# Patient Record
Sex: Female | Born: 1940 | Race: Black or African American | Hispanic: No | Marital: Single | Smoking: Former smoker
Health system: Southern US, Community
[De-identification: ages and names within clinical notes are randomized; demographics above are authoritative.]

## PROBLEM LIST (undated history)

## (undated) DIAGNOSIS — C50919 Malignant neoplasm of unspecified site of unspecified female breast: Secondary | ICD-10-CM

## (undated) DIAGNOSIS — G709 Myoneural disorder, unspecified: Secondary | ICD-10-CM

## (undated) DIAGNOSIS — C801 Malignant (primary) neoplasm, unspecified: Secondary | ICD-10-CM

## (undated) DIAGNOSIS — M1990 Unspecified osteoarthritis, unspecified site: Secondary | ICD-10-CM

## (undated) DIAGNOSIS — Q6689 Other  specified congenital deformities of feet: Secondary | ICD-10-CM

## (undated) DIAGNOSIS — Z973 Presence of spectacles and contact lenses: Secondary | ICD-10-CM

## (undated) DIAGNOSIS — K219 Gastro-esophageal reflux disease without esophagitis: Secondary | ICD-10-CM

## (undated) DIAGNOSIS — I10 Essential (primary) hypertension: Secondary | ICD-10-CM

## (undated) DIAGNOSIS — E079 Disorder of thyroid, unspecified: Secondary | ICD-10-CM

## (undated) DIAGNOSIS — Z923 Personal history of irradiation: Secondary | ICD-10-CM

## (undated) DIAGNOSIS — E785 Hyperlipidemia, unspecified: Secondary | ICD-10-CM

## (undated) DIAGNOSIS — C799 Secondary malignant neoplasm of unspecified site: Secondary | ICD-10-CM

## (undated) DIAGNOSIS — I890 Lymphedema, not elsewhere classified: Secondary | ICD-10-CM

## (undated) HISTORY — PX: COLONOSCOPY: SHX174

## (undated) HISTORY — DX: Malignant (primary) neoplasm, unspecified: C80.1

## (undated) HISTORY — DX: Disorder of thyroid, unspecified: E07.9

## (undated) HISTORY — PX: DILATION AND CURETTAGE OF UTERUS: SHX78

## (undated) HISTORY — DX: Hyperlipidemia, unspecified: E78.5

## (undated) HISTORY — DX: Other specified congenital deformities of feet: Q66.89

## (undated) HISTORY — PX: FOOT SURGERY: SHX648

## (undated) HISTORY — DX: Unspecified osteoarthritis, unspecified site: M19.90

---

## 2000-08-10 HISTORY — PX: THYROIDECTOMY: SHX17

## 2008-08-29 LAB — HM PAP SMEAR: HM Pap smear: NORMAL

## 2010-08-10 HISTORY — PX: BREAST SURGERY: SHX581

## 2011-07-22 ENCOUNTER — Telehealth: Payer: Self-pay | Admitting: Oncology

## 2011-07-22 ENCOUNTER — Telehealth: Payer: Self-pay | Admitting: *Deleted

## 2011-07-22 ENCOUNTER — Other Ambulatory Visit: Payer: Self-pay | Admitting: Oncology

## 2011-07-22 DIAGNOSIS — N63 Unspecified lump in unspecified breast: Secondary | ICD-10-CM

## 2011-07-22 DIAGNOSIS — Z9889 Other specified postprocedural states: Secondary | ICD-10-CM

## 2011-07-22 NOTE — Telephone Encounter (Signed)
patient confirmed over the phone the new date and time of the new patient appointment  for 08-26-2011 starting at 2:00pm made patient mammogram appointment for 08-18-2011 at 2:00pm at the breast center

## 2011-07-22 NOTE — Telephone Encounter (Signed)
New referral Dx Breast Cancer.

## 2011-08-18 ENCOUNTER — Ambulatory Visit
Admission: RE | Admit: 2011-08-18 | Discharge: 2011-08-18 | Disposition: A | Discharge status: Home / Self Care | Payer: Medicare Other | Source: Ambulatory Visit | Attending: Oncology | Admitting: Oncology

## 2011-08-18 ENCOUNTER — Other Ambulatory Visit: Payer: Self-pay | Admitting: Oncology

## 2011-08-18 DIAGNOSIS — N63 Unspecified lump in unspecified breast: Secondary | ICD-10-CM

## 2011-08-26 ENCOUNTER — Other Ambulatory Visit (HOSPITAL_BASED_OUTPATIENT_CLINIC_OR_DEPARTMENT_OTHER): Payer: Medicare Other

## 2011-08-26 ENCOUNTER — Ambulatory Visit (HOSPITAL_BASED_OUTPATIENT_CLINIC_OR_DEPARTMENT_OTHER): Payer: Medicare Other

## 2011-08-26 ENCOUNTER — Telehealth: Payer: Self-pay | Admitting: Oncology

## 2011-08-26 ENCOUNTER — Ambulatory Visit: Payer: Medicare Other | Admitting: Oncology

## 2011-08-26 VITALS — BP 132/80 | HR 80 | Temp 98.2°F | Ht 60.0 in | Wt 227.5 lb

## 2011-08-26 DIAGNOSIS — C50919 Malignant neoplasm of unspecified site of unspecified female breast: Secondary | ICD-10-CM

## 2011-08-26 LAB — CBC WITH DIFFERENTIAL/PLATELET
BASO%: 0.4 % (ref 0.0–2.0)
Basophils Absolute: 0 10*3/uL (ref 0.0–0.1)
Eosinophils Absolute: 0.2 10*3/uL (ref 0.0–0.5)
HCT: 37.2 % (ref 34.8–46.6)
HGB: 12.7 g/dL (ref 11.6–15.9)
MONO#: 0.5 10*3/uL (ref 0.1–0.9)
NEUT#: 3.8 10*3/uL (ref 1.5–6.5)
NEUT%: 62.8 % (ref 38.4–76.8)
Platelets: 279 10*3/uL (ref 145–400)
WBC: 6.1 10*3/uL (ref 3.9–10.3)
lymph#: 1.6 10*3/uL (ref 0.9–3.3)

## 2011-08-26 NOTE — Telephone Encounter (Signed)
gve the pt her April 2013 appt calendar 

## 2011-08-27 LAB — COMPREHENSIVE METABOLIC PANEL
ALT: 10 U/L (ref 0–35)
BUN: 13 mg/dL (ref 6–23)
CO2: 26 mEq/L (ref 19–32)
Calcium: 9.5 mg/dL (ref 8.4–10.5)
Chloride: 105 mEq/L (ref 96–112)
Creatinine, Ser: 0.75 mg/dL (ref 0.50–1.10)
Glucose, Bld: 77 mg/dL (ref 70–99)

## 2011-08-27 LAB — CANCER ANTIGEN 27.29: CA 27.29: 24 U/mL (ref 0–39)

## 2011-08-27 LAB — VITAMIN D 25 HYDROXY (VIT D DEFICIENCY, FRACTURES): Vit D, 25-Hydroxy: 22 ng/mL — ABNORMAL LOW (ref 30–89)

## 2011-08-30 DIAGNOSIS — C50919 Malignant neoplasm of unspecified site of unspecified female breast: Secondary | ICD-10-CM | POA: Insufficient documentation

## 2011-08-30 NOTE — Progress Notes (Signed)
ID: Christy Harrell  DOB: 08/14/1940  MR#: [id number]  CSN#: [id number]  HPI: The patient is a 71 year old woman recently returned to this area from [place], with a history of breast cancer, establishing herself in my practice.  The patient had routine screening mammography 10/16/2010 showing a lobulated mass in the right subareolar region measuring up to 5.5 cm. The Left breast showed some central microcalcifications. Biopsy of the Right breast mass on 10/28/2010 showed an invasive ductal carcinoma, grade 2, estrogen receptor positive (Allred score 8), progesterone receptor positive (Allred score 5), with an equivocal HER-2. The Left breast area of microcalcifications was biopsied at the same time, and was read as suspicious for DCIS.  On 11/17/2010 the patient underwent Right lumpectomy and axillary lymph node dissection for what proved to be a 3 cm invasive ductal carcinoma, grade 2, with some papillary and mucinous features. One of 16 lymph nodes was involved. FISH showed no HER-2 amplification. Left breast biopsy was benign.  The patient had an Oncotype sent, with a score of 27, predicting a risk of distant recurrence after 5 years of tamoxifen in the 18% range. With this intermediate result, the decision was made not to proceed with chemotherapy, since the patient is the sole caregiver to her very elderly mother. Instead the patient proceeded to radiation treatment which was completed 03/06/2011. She started anastrozole at that point.  Interval History:   She is settling back in [place] well, with no unusual complaints. She has not yet established herself with her primary care physician. Her only relative nearby aside from her mother, is her uncle Burman Freestone, who lives in [place]  ROS:  She has some hot flashes and night sweats, which she attributes probably correctly to anastrozole. She is not having any of the diffuse arthralgias/myalgias that some patients can experience  from that medication, although she has chronic pain in her right shoulder and lower back. She sleeps on 2 pillows, but is not otherwise short of breath with her activities of daily living. A detailed review of systems was otherwise noncontributory  Allergies not on file  Current Outpatient Prescriptions  Medication Sig Dispense Refill  . anastrozole (ARIMIDEX) 1 MG tablet Take 1 mg by mouth daily.      Marland Kitchen atorvastatin (LIPITOR) 10 MG tablet Take 10 mg by mouth daily.      . Calcium Carbonate-Vitamin D (CALCIUM 600 + D PO) Take 1 tablet by mouth.      . furosemide (LASIX) 40 MG tablet Take 40 mg by mouth daily.      Marland Kitchen HYDROcodone-acetaminophen (NORCO) 5-325 MG per tablet Take 1 tablet by mouth every 6 (six) hours as needed.      . ibandronate (BONIVA) 150 MG tablet Take 150 mg by mouth every 30 (thirty) days. Take in the morning with a full glass of water, on an empty stomach, and do not take anything else by mouth or lie down for the next 30 min.      . metFORMIN (GLUCOPHAGE) 500 MG tablet Take 500 mg by mouth daily.       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 Status post C-section. Obesity. Osteopenia. Hypertension. History of depression. History of minimal tobacco abuse   Family history: The patient's father died from unknown causes. The patient's mother is alive at age 90. The patient was a single child. There is no history of breast or ovarian cancer  in the family to her knowledge.  GYN Hx: Menarche age 66, menopause in her early 76s. The patient is GX P1, first pregnancy to term age 89. She did not take hormone replacement  Social history: The patient grew up in [place], [employer], then moved to [place] where she lived about 40 years. 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  Her mother, [name], 58, lives with the patient. The patient's daughter [name], 41, is disabled secondary to pulmonary hypertension. She can be reached at [phone]. The patient has 2 grandchildren, [name] who works at a Human resources officer business and is 71 years old, in general, 13, completing high school.  Health maintenance: She smoked less than a half a pack per day for less than 15 years so she has a less than 10-pack-year tobacco abuse history. She uses alcohol rarely. Her cholesterol is under treatment. She had a bone density in 2012 and is on ibandronate chronically. She has not had a Pap smear in many years.  Objective:  Filed Vitals:   08/26/11 1438  BP: 132/80  Pulse: 80  Temp: 98.2 F (36.8 C)    BMI: Body mass index is 44.43 kg/(m^2).   ECOG FS: 0  Physical Exam:   Sclerae unicteric  Oropharynx clear  No peripheral adenopathy  Lungs clear -- no rales or rhonchi  Heart regular rate and rhythm  Abdomen obese, benign  MSK scoliosis but no focal spinal tenderness  Neuro nonfocal  Breast exam: Right breast status post lumpectomy; no evidence of local recurrence. Left breast no suspicious masses.  Lab Results:      Chemistry      Component Value Date/Time   NA 140 08/26/2011 1355   K 4.2 08/26/2011 1355   CL 105 08/26/2011 1355   CO2 26 08/26/2011 1355   BUN 13 08/26/2011 1355   CREATININE 0.75 08/26/2011 1355      Component Value Date/Time   CALCIUM 9.5 08/26/2011 1355   ALKPHOS 63 08/26/2011 1355   AST 14 08/26/2011 1355   ALT 10 08/26/2011 1355   BILITOT 0.3 08/26/2011 1355       Lab Results  Component Value Date   WBC 6.1 08/26/2011   HGB 12.7 08/26/2011   HCT 37.2 08/26/2011   MCV 89.9 08/26/2011   PLT 279 08/26/2011   NEUTROABS 3.8 08/26/2011    Studies/Results:  Mm Digital Diagnostic Bilat  08/18/2011  *RADIOLOGY REPORT*  Clinical Data:  The patient underwent right lumpectomy and radiation therapy for breast cancer in 2012.  She underwent left  surgical excisional biopsy with benign results in 2012.  DIGITAL DIAGNOSTIC BILATERAL MAMMOGRAM WITH CAD  Comparison:  10/16/2010 from [facility] in [place], [place]  Findings:  The breast tissue is almost entirely fatty. Right lumpectomy and radiation therapy changes are noted. Left excisional biopsy changes are noted.  There is no dominant mass, nonsurgical architectural distortion or calcification to suggest malignancy. Mammographic images were processed with CAD.  IMPRESSION: No mammographic evidence of malignancy.  Yearly diagnostic mammography is suggested.  BI-RADS CATEGORY 2:  Benign finding(s).  Original Report Authenticated By: Daryl Eastern, M.D.    Assessment: 71 year old [place] woman status post right lumpectomy and axillary lymph node dissection 11/17/2010 for a pT2 pN1, stage IIb invasive ductal carcinoma, grade 2, estrogen and progesterone receptor positive, HER-2 negative, with an Oncotype DX recurrence score of 27 predicting a risk of distant recurrence  of 18% with 5 years of tamoxifen (intermediate score); status post radiation completed July of 2012, at which time the patient started anastrozole.   Plan: She is doing very well from a breast cancer point of view, and the plan is to continue anastrozole for 5 years and then decide whether or not to go on to 10 years. We frequently see patients like her every 3 months for the first 2 years, then every 6 months. She is going to establish yourself with Dr. Sanda Linger later this month. If he is going to be seeing her intensely because of her chronic medical problems, then visits here would not need to be so frequent. MAGRINAT,GUSTAV C 08/30/2011

## 2011-09-03 ENCOUNTER — Other Ambulatory Visit (INDEPENDENT_AMBULATORY_CARE_PROVIDER_SITE_OTHER): Payer: Medicare Other

## 2011-09-03 ENCOUNTER — Encounter: Payer: Self-pay | Admitting: Internal Medicine

## 2011-09-03 ENCOUNTER — Ambulatory Visit (INDEPENDENT_AMBULATORY_CARE_PROVIDER_SITE_OTHER): Payer: Medicare Other | Admitting: Internal Medicine

## 2011-09-03 DIAGNOSIS — E1140 Type 2 diabetes mellitus with diabetic neuropathy, unspecified: Secondary | ICD-10-CM | POA: Insufficient documentation

## 2011-09-03 DIAGNOSIS — M810 Age-related osteoporosis without current pathological fracture: Secondary | ICD-10-CM

## 2011-09-03 DIAGNOSIS — IMO0001 Reserved for inherently not codable concepts without codable children: Secondary | ICD-10-CM

## 2011-09-03 DIAGNOSIS — E7800 Pure hypercholesterolemia, unspecified: Secondary | ICD-10-CM

## 2011-09-03 DIAGNOSIS — E785 Hyperlipidemia, unspecified: Secondary | ICD-10-CM | POA: Insufficient documentation

## 2011-09-03 DIAGNOSIS — IMO0002 Reserved for concepts with insufficient information to code with codable children: Secondary | ICD-10-CM | POA: Insufficient documentation

## 2011-09-03 DIAGNOSIS — M159 Polyosteoarthritis, unspecified: Secondary | ICD-10-CM

## 2011-09-03 DIAGNOSIS — Z23 Encounter for immunization: Secondary | ICD-10-CM

## 2011-09-03 DIAGNOSIS — E039 Hypothyroidism, unspecified: Secondary | ICD-10-CM

## 2011-09-03 LAB — URINALYSIS, ROUTINE W REFLEX MICROSCOPIC
Bilirubin Urine: NEGATIVE
Ketones, ur: NEGATIVE
Nitrite: NEGATIVE
Total Protein, Urine: NEGATIVE

## 2011-09-03 LAB — CK: Total CK: 155 U/L (ref 7–177)

## 2011-09-03 LAB — LIPID PANEL
Cholesterol: 255 mg/dL — ABNORMAL HIGH (ref 0–200)
Total CHOL/HDL Ratio: 4
Triglycerides: 121 mg/dL (ref 0.0–149.0)

## 2011-09-03 LAB — LDL CHOLESTEROL, DIRECT: Direct LDL: 174.5 mg/dL

## 2011-09-03 MED ORDER — CELECOXIB 200 MG PO CAPS: 200.0000 mg | ORAL_CAPSULE | Freq: Two times a day (BID) | ORAL | Status: AC

## 2011-09-03 NOTE — Progress Notes (Signed)
Subjective:    Patient ID: Christy Harrell, female    DOB: 01-23-1941, 71 y.o.   MRN: [id number]  Diabetes She presents for her follow-up diabetic visit. She has type 2 diabetes mellitus. Her disease course has been stable. There are no hypoglycemic associated symptoms. Pertinent negatives for hypoglycemia include no dizziness, headaches, pallor, seizures, speech difficulty or tremors. Pertinent negatives for diabetes include no blurred vision, no chest pain, no fatigue, no foot paresthesias, no foot ulcerations, no polydipsia, no polyphagia, no polyuria, no visual change, no weakness and no weight loss. There are no hypoglycemic complications. Symptoms are stable. There are no diabetic complications. Current diabetic treatment includes oral agent (monotherapy). She is compliant with treatment all of the time. Her weight is stable. She is following a generally healthy diet. Meal planning includes avoidance of concentrated sweets. She has not had a previous visit with a dietician. She participates in exercise intermittently. There is no change in her home blood glucose trend. An ACE inhibitor/angiotensin II receptor blocker is not being taken. She does not see a podiatrist.Eye exam is current.      Review of Systems  Constitutional: Negative for fever, chills, weight loss, diaphoresis, activity change, appetite change, fatigue and unexpected weight change.  HENT: Negative.   Eyes: Negative.  Negative for blurred vision.  Respiratory: Negative for cough, chest tightness, shortness of breath, wheezing and stridor.   Cardiovascular: Negative for chest pain, palpitations and leg swelling.  Gastrointestinal: Negative for nausea, vomiting, abdominal pain, diarrhea, constipation, blood in stool, abdominal distention, anal bleeding and rectal pain.  Genitourinary: Negative for dysuria, urgency, polyuria, frequency, hematuria, flank pain, decreased urine volume, enuresis, difficulty urinating and  dyspareunia.  Musculoskeletal: Positive for myalgias and arthralgias (shoulders, hips, and knees). Negative for back pain, joint swelling and gait problem.  Skin: Negative for color change, pallor, rash and wound.  Neurological: Negative for dizziness, tremors, seizures, syncope, facial asymmetry, speech difficulty, weakness, light-headedness, numbness and headaches.  Hematological: Negative for polydipsia, polyphagia and adenopathy. Does not bruise/bleed easily.  Psychiatric/Behavioral: Negative.        Objective:   Physical Exam  Vitals reviewed. Constitutional: She is oriented to person, place, and time. She appears well-developed and well-nourished. No distress.  HENT:  Head: Normocephalic and atraumatic.  Mouth/Throat: Oropharynx is clear and moist. No oropharyngeal exudate.  Eyes: Conjunctivae are normal. Right eye exhibits no discharge. Left eye exhibits no discharge. No scleral icterus.  Neck: Normal range of motion. Neck supple. No JVD present. No tracheal deviation present. No thyromegaly present.  Cardiovascular: Normal rate, regular rhythm, normal heart sounds and intact distal pulses.  Exam reveals no gallop and no friction rub.   No murmur heard. Pulmonary/Chest: Effort normal and breath sounds normal. No stridor. No respiratory distress. She has no wheezes. She has no rales. She exhibits no tenderness.  Abdominal: Soft. Bowel sounds are normal. She exhibits no distension and no mass. There is no tenderness. There is no rebound and no guarding.  Musculoskeletal: Normal range of motion. She exhibits no edema and no tenderness.  Lymphadenopathy:    She has no cervical adenopathy.  Neurological: She is oriented to person, place, and time.  Skin: Skin is warm and dry. No rash noted. She is not diaphoretic. No erythema. No pallor.  Psychiatric: She has a normal mood and affect. Her behavior is normal. Judgment and thought content normal.      Lab Results  Component Value  Date   WBC 6.1 08/26/2011   HGB 12.7  08/26/2011   HCT 37.2 08/26/2011   PLT 279 08/26/2011   GLUCOSE 77 08/26/2011   ALT 10 08/26/2011   AST 14 08/26/2011   NA 140 08/26/2011   K 4.2 08/26/2011   CL 105 08/26/2011   CREATININE 0.75 08/26/2011   BUN 13 08/26/2011   CO2 26 08/26/2011      Assessment & Plan:

## 2011-09-03 NOTE — Assessment & Plan Note (Signed)
Check her TSH level today 

## 2011-09-03 NOTE — Assessment & Plan Note (Signed)
She is due for an a1c and a UA, for now she will continue with metformin

## 2011-09-03 NOTE — Patient Instructions (Signed)

## 2011-09-03 NOTE — Assessment & Plan Note (Signed)
She has had a good response to celebrex in that past so I have asked her to restart it

## 2011-09-03 NOTE — Assessment & Plan Note (Signed)
In light her myalgias and arthralgias I will check a CK level today and will also look at FLP

## 2011-09-03 NOTE — Assessment & Plan Note (Signed)
I will check her Vit D level today 

## 2011-09-05 LAB — VITAMIN D 1,25 DIHYDROXY
Vitamin D 1, 25 (OH)2 Total: 62 pg/mL (ref 18–72)
Vitamin D2 1, 25 (OH)2: <8 pg/mL
Vitamin D3 1, 25 (OH)2: 62 pg/mL

## 2011-09-07 ENCOUNTER — Encounter: Payer: Self-pay | Admitting: Internal Medicine

## 2011-09-30 ENCOUNTER — Telehealth: Payer: Self-pay | Admitting: Oncology

## 2011-09-30 NOTE — Telephone Encounter (Signed)
S/w the pt and she is aware of her April 2013 appt that has been r/s from 11:00am to 11:45 am on the same day

## 2011-10-09 ENCOUNTER — Other Ambulatory Visit: Payer: Self-pay | Admitting: Internal Medicine

## 2011-10-09 ENCOUNTER — Ambulatory Visit (INDEPENDENT_AMBULATORY_CARE_PROVIDER_SITE_OTHER): Payer: Medicare Other | Admitting: Internal Medicine

## 2011-10-09 ENCOUNTER — Encounter: Payer: Self-pay | Admitting: Internal Medicine

## 2011-10-09 ENCOUNTER — Ambulatory Visit (INDEPENDENT_AMBULATORY_CARE_PROVIDER_SITE_OTHER)
Admission: RE | Admit: 2011-10-09 | Discharge: 2011-10-09 | Disposition: A | Discharge status: Home / Self Care | Payer: Medicare Other | Source: Ambulatory Visit | Attending: Internal Medicine | Admitting: Internal Medicine

## 2011-10-09 DIAGNOSIS — E039 Hypothyroidism, unspecified: Secondary | ICD-10-CM

## 2011-10-09 DIAGNOSIS — M25511 Pain in right shoulder: Secondary | ICD-10-CM | POA: Insufficient documentation

## 2011-10-09 DIAGNOSIS — M159 Polyosteoarthritis, unspecified: Secondary | ICD-10-CM

## 2011-10-09 DIAGNOSIS — M19019 Primary osteoarthritis, unspecified shoulder: Secondary | ICD-10-CM

## 2011-10-09 DIAGNOSIS — M25519 Pain in unspecified shoulder: Secondary | ICD-10-CM

## 2011-10-09 DIAGNOSIS — M25512 Pain in left shoulder: Secondary | ICD-10-CM

## 2011-10-09 DIAGNOSIS — E7800 Pure hypercholesterolemia, unspecified: Secondary | ICD-10-CM

## 2011-10-09 MED ORDER — HYDROCODONE-ACETAMINOPHEN 5-325 MG PO TABS: 1.0000 | ORAL_TABLET | Freq: Four times a day (QID) | ORAL | Status: DC | PRN

## 2011-10-09 MED ORDER — NAPROXEN SODIUM ER 375 MG PO TB24: 1.0000 | ORAL_TABLET | Freq: Every day | ORAL | Status: DC

## 2011-10-09 NOTE — Assessment & Plan Note (Signed)
I will xray both shoulders today to look for DJD

## 2011-10-09 NOTE — Assessment & Plan Note (Signed)
Try naprelan and continue hydrocodone prn

## 2011-10-09 NOTE — Assessment & Plan Note (Signed)
She will get her TSH done today

## 2011-10-09 NOTE — Progress Notes (Signed)
  Subjective:    Patient ID: Christy Harrell, female    DOB: 27-Apr-1941, 71 y.o.   MRN: [id number]  Hyperlipidemia This is a chronic problem. The current episode started more than 1 year ago. The problem is uncontrolled. Recent lipid tests were reviewed and are variable. Exacerbating diseases include hypothyroidism and obesity. She has no history of chronic renal disease, diabetes, liver disease or nephrotic syndrome. Associated symptoms include myalgias. Pertinent negatives include no chest pain or shortness of breath. Current antihyperlipidemic treatment includes statins. The current treatment provides moderate improvement of lipids. Compliance problems include medication side effects, adherence to exercise and adherence to diet.       Review of Systems  Constitutional: Negative for fever, chills, diaphoresis, activity change, appetite change, fatigue and unexpected weight change.  HENT: Negative.   Eyes: Negative.   Respiratory: Negative for apnea, cough, choking, chest tightness, shortness of breath, wheezing and stridor.   Cardiovascular: Negative for chest pain, palpitations and leg swelling.  Gastrointestinal: Negative for nausea, vomiting, abdominal pain, diarrhea, constipation and blood in stool.  Genitourinary: Negative.   Musculoskeletal: Positive for myalgias and back pain (chronic). Negative for joint swelling and gait problem. Arthralgias: both shoulders hurt.  Skin: Negative for color change, pallor, rash and wound.  Neurological: Negative for dizziness, tremors, seizures, syncope, facial asymmetry, speech difficulty, weakness, light-headedness, numbness and headaches.  Hematological: Negative for adenopathy. Does not bruise/bleed easily.  Psychiatric/Behavioral: Negative.        Objective:   Physical Exam  Vitals reviewed. Constitutional: She is oriented to person, place, and time. She appears well-developed and well-nourished. No distress.  HENT:  Head: Normocephalic  and atraumatic.  Mouth/Throat: Oropharynx is clear and moist. No oropharyngeal exudate.  Eyes: Conjunctivae are normal. Right eye exhibits no discharge. Left eye exhibits no discharge. No scleral icterus.  Neck: Normal range of motion. Neck supple. No JVD present. No tracheal deviation present. No thyromegaly present.  Cardiovascular: Normal rate, regular rhythm, normal heart sounds and intact distal pulses.  Exam reveals no gallop and no friction rub.   No murmur heard. Pulmonary/Chest: Effort normal and breath sounds normal. No stridor. No respiratory distress. She has no wheezes. She has no rales. She exhibits no tenderness.  Abdominal: Soft. Bowel sounds are normal. She exhibits no distension and no mass. There is no tenderness. There is no rebound and no guarding.  Musculoskeletal: Normal range of motion. She exhibits no edema and no tenderness.  Lymphadenopathy:    She has no cervical adenopathy.  Neurological: She is oriented to person, place, and time.  Skin: Skin is warm and dry. No rash noted. She is not diaphoretic. No erythema. No pallor.  Psychiatric: She has a normal mood and affect. Her behavior is normal. Judgment and thought content normal.     Lab Results  Component Value Date   WBC 6.1 08/26/2011   HGB 12.7 08/26/2011   HCT 37.2 08/26/2011   PLT 279 08/26/2011   GLUCOSE 77 08/26/2011   CHOL 255* 09/03/2011   TRIG 121.0 09/03/2011   HDL 67.60 09/03/2011   LDLDIRECT 174.5 09/03/2011   ALT 10 08/26/2011   AST 14 08/26/2011   NA 140 08/26/2011   K 4.2 08/26/2011   CL 105 08/26/2011   CREATININE 0.75 08/26/2011   BUN 13 08/26/2011   CO2 26 08/26/2011   HGBA1C 6.0 09/03/2011       Assessment & Plan:

## 2011-10-09 NOTE — Assessment & Plan Note (Signed)
She has muscle aches so I asked her to stop the lipitor for now

## 2011-10-09 NOTE — Assessment & Plan Note (Signed)
Recent a1c looked real good

## 2011-10-09 NOTE — Patient Instructions (Signed)
Osteoarthritis Osteoarthritis is the most common form of arthritis. It is redness, soreness, and swelling (inflammation) affecting the cartilage. Cartilage acts as a cushion, covering the ends of bones where they meet to form a joint. CAUSES  Over time, the cartilage begins to wear away. This causes bone to rub on bone. This produces pain and stiffness in the affected joints. Factors that contribute to this problem are:  Excessive body weight.   Age.   Overuse of joints.  SYMPTOMS   People with osteoarthritis usually experience joint pain, swelling, or stiffness.   Over time, the joint may lose its normal shape.   Small deposits of bone (osteophytes) may grow on the edges of the joint.   Bits of bone or cartilage can break off and float inside the joint space. This may cause more pain and damage.   Osteoarthritis can lead to depression, anxiety, feelings of helplessness, and limitations on daily activities.  The most commonly affected joints are in the:  Ends of the fingers.   Thumbs.   Neck.   Lower back.   Knees.   Hips.  DIAGNOSIS  Diagnosis is mostly based on your symptoms and exam. Tests may be helpful, including:  X-rays of the affected joint.   A computerized magnetic scan (MRI).   Blood tests to rule out other types of arthritis.   Joint fluid tests. This involves using a needle to draw fluid from the joint and examining the fluid under a microscope.  TREATMENT  Goals of treatment are to control pain, improve joint function, maintain a normal body weight, and maintain a healthy lifestyle. Treatment approaches may include:  A prescribed exercise program with rest and joint relief.   Weight control with nutritional education.   Pain relief techniques such as:   Properly applied heat and cold.   Electric pulses delivered to nerve endings under the skin (transcutaneous electrical nerve stimulation, TENS).   Massage.   Certain supplements. Ask your  caregiver before using any supplements, especially in combination with prescribed drugs.   Medicines to control pain, such as:   Acetaminophen.   Nonsteroidal anti-inflammatory drugs (NSAIDs), such as naproxen.   Narcotic or central-acting agents, such as tramadol. This drug carries a risk of addiction and is generally prescribed for short-term use.   Corticosteroids. These can be given orally or as injection. This is a short-term treatment, not recommended for routine use.   Surgery to reposition the bones and relieve pain (osteotomy) or to remove loose pieces of bone and cartilage. Joint replacement may be needed in advanced states of osteoarthritis.  HOME CARE INSTRUCTIONS  Your caregiver can recommend specific types of exercise. These may include:  Strengthening exercises. These are done to strengthen the muscles that support joints affected by arthritis. They can be performed with weights or with exercise bands to add resistance.   Aerobic activities. These are exercises, such as brisk walking or low-impact aerobics, that get your heart pumping. They can help keep your lungs and circulatory system in shape.   Range-of-motion activities. These keep your joints limber.   Balance and agility exercises. These help you maintain daily living skills.  Learning about your condition and being actively involved in your care will help improve the course of your osteoarthritis. SEEK MEDICAL CARE IF:   You feel hot or your skin turns red.   You develop a rash in addition to your joint pain.   You have an oral temperature above 102 F (38.9 C).  FOR   MORE INFORMATION  National Institute of Arthritis and Musculoskeletal and Skin Diseases: www.niams.nih.gov National Institute on Aging: www.nia.nih.gov American College of Rheumatology: www.rheumatology.org Document Released: 07/27/2005 Document Revised: 04/08/2011 Document Reviewed: 11/07/2009 Exit[employer] Patient Information 2012 [employer]. 

## 2011-10-12 ENCOUNTER — Telehealth: Payer: Self-pay

## 2011-10-12 MED ORDER — METFORMIN HCL 500 MG PO TABS: 500.0000 mg | ORAL_TABLET | Freq: Every day | ORAL | Status: DC

## 2011-10-12 NOTE — Telephone Encounter (Signed)
Received refill request for metformin

## 2011-10-25 ENCOUNTER — Emergency Department (HOSPITAL_COMMUNITY): Payer: Medicare Other

## 2011-10-25 ENCOUNTER — Encounter (HOSPITAL_COMMUNITY): Payer: Self-pay

## 2011-10-25 ENCOUNTER — Other Ambulatory Visit: Payer: Self-pay

## 2011-10-25 ENCOUNTER — Emergency Department (HOSPITAL_COMMUNITY)
Admission: EM | Admit: 2011-10-25 | Discharge: 2011-10-25 | Disposition: A | Discharge status: Home / Self Care | Payer: Medicare Other | Attending: Emergency Medicine | Admitting: Emergency Medicine

## 2011-10-25 DIAGNOSIS — R5383 Other fatigue: Secondary | ICD-10-CM | POA: Insufficient documentation

## 2011-10-25 DIAGNOSIS — M7989 Other specified soft tissue disorders: Secondary | ICD-10-CM

## 2011-10-25 DIAGNOSIS — I890 Lymphedema, not elsewhere classified: Secondary | ICD-10-CM | POA: Insufficient documentation

## 2011-10-25 DIAGNOSIS — E119 Type 2 diabetes mellitus without complications: Secondary | ICD-10-CM | POA: Insufficient documentation

## 2011-10-25 DIAGNOSIS — R5381 Other malaise: Secondary | ICD-10-CM | POA: Insufficient documentation

## 2011-10-25 DIAGNOSIS — Z853 Personal history of malignant neoplasm of breast: Secondary | ICD-10-CM | POA: Insufficient documentation

## 2011-10-25 DIAGNOSIS — M79609 Pain in unspecified limb: Secondary | ICD-10-CM

## 2011-10-25 DIAGNOSIS — M542 Cervicalgia: Secondary | ICD-10-CM | POA: Insufficient documentation

## 2011-10-25 DIAGNOSIS — R609 Edema, unspecified: Secondary | ICD-10-CM | POA: Insufficient documentation

## 2011-10-25 DIAGNOSIS — R079 Chest pain, unspecified: Secondary | ICD-10-CM | POA: Insufficient documentation

## 2011-10-25 LAB — BASIC METABOLIC PANEL
BUN: 15 mg/dL (ref 6–23)
CO2: 28 mEq/L (ref 19–32)
Calcium: 9.4 mg/dL (ref 8.4–10.5)
Chloride: 104 mEq/L (ref 96–112)
Creatinine, Ser: 0.72 mg/dL (ref 0.50–1.10)
Potassium: 3.8 mEq/L (ref 3.5–5.1)
Sodium: 140 mEq/L (ref 135–145)

## 2011-10-25 LAB — CARDIAC PANEL(CRET KIN+CKTOT+MB+TROPI)
Relative Index: 1.2 (ref 0.0–2.5)
Troponin I: <0.3 ng/mL (ref <0.30)

## 2011-10-25 LAB — URINE MICROSCOPIC-ADD ON

## 2011-10-25 LAB — CBC
HCT: 36.5 % (ref 36.0–46.0)
MCH: 29.3 pg (ref 26.0–34.0)
MCV: 88.4 fL (ref 78.0–100.0)
Platelets: 267 10*3/uL (ref 150–400)
RBC: 4.13 MIL/uL (ref 3.87–5.11)
RDW: 14.3 % (ref 11.5–15.5)

## 2011-10-25 LAB — URINALYSIS, ROUTINE W REFLEX MICROSCOPIC
Bilirubin Urine: NEGATIVE
Glucose, UA: NEGATIVE mg/dL
Ketones, ur: NEGATIVE mg/dL
Protein, ur: NEGATIVE mg/dL
pH: 6 (ref 5.0–8.0)

## 2011-10-25 MED ORDER — OXYCODONE-ACETAMINOPHEN 5-325 MG PO TABS
1.0000 | ORAL_TABLET | Freq: Once | ORAL | Status: AC
  Administered 2011-10-25: 1 via ORAL
  Filled 2011-10-25: qty 1

## 2011-10-25 MED ORDER — ALPRAZOLAM 0.25 MG PO TABS: 0.2500 mg | ORAL_TABLET | Freq: Every evening | ORAL | Status: AC | PRN

## 2011-10-25 MED ORDER — OXYCODONE-ACETAMINOPHEN 5-325 MG PO TABS: 1.0000 | ORAL_TABLET | Freq: Three times a day (TID) | ORAL | Status: DC | PRN

## 2011-10-25 MED ORDER — SODIUM CHLORIDE 0.9 % IV BOLUS (SEPSIS)
500.0000 mL | INTRAVENOUS | Status: AC
  Administered 2011-10-25: 500 mL via INTRAVENOUS

## 2011-10-25 NOTE — ED Notes (Signed)
Patient given discharge instructions, information, prescriptions, and diet order. Patient states that they adequately understand discharge information given and to return to ED if symptoms return or worsen.     

## 2011-10-25 NOTE — ED Notes (Signed)
Niece---Annette [employer] number [phone]

## 2011-10-25 NOTE — ED Notes (Signed)
Vascular Tech at bedside.  

## 2011-10-25 NOTE — ED Provider Notes (Signed)
History     CSN: [id number]  Arrival date & time 10/25/11  1724   First MD Initiated Contact with Patient 10/25/11 1831      Chief Complaint  Patient presents with  . Weakness  Pleasant female pt of Dr Eleanora Neighbor.  Recently moved here from [place] 2 months ago to take care of her elderly mother. According to the patient. She has are to been seen by her primary Dr. and is already been seen at the cancer center recently. Pt c/o swelling and aching  In neck and R > L UE's.  RUE swollen over past 1 week.  States she is a cancer survivor.  Breast cancer.   S/p lumpectomy and LD dissection done 1 year ago.  Also c/o LLE pain and foot pain.  Intermittent R foot swelling.  H/o club foot.  Denies chest pains but does have occasional "tightness" , non exertional, states because She is "Upset".  Minimal shortness of breath, attributes to anxiety.  This has been going on for 1 week.  Saw PMD  Dr Yetta Barre recently, started on Naproxen and had xrays of both shoulders.  Did see Dr Darnelle Catalan In January.  No fevers.  No pleuritic pain.  No calf pain or tenderness.     (Consider location/radiation/quality/duration/timing/severity/associated sxs/prior treatment) HPI  Past Medical History  Diagnosis Date  . Cancer     breast  . Club foot   . Arthritis   . Thyroid disease   . Hyperlipidemia   . Diabetes mellitus   . Osteoporosis     Past Surgical History  Procedure Date  . Breast surgery   . Cesarean section   . Thyroidectomy     Family History  Problem Relation Age of Onset  . Cancer Neg Hx   . Arthritis Neg Hx   . Heart disease Neg Hx   . Hyperlipidemia Neg Hx   . Hypertension Neg Hx     History  Substance Use Topics  . Smoking status: Former Games developer  . Smokeless tobacco: Never Used  . Alcohol Use: No    OB History    Grav Para Term Preterm Abortions TAB SAB Ect Mult Living                  Review of Systems  All other systems reviewed and are negative.    Allergies   Review of patient's allergies indicates no known allergies.  Home Medications   Current Outpatient Rx  Name Route Sig Dispense Refill  . ANASTROZOLE 1 MG PO TABS Oral Take 1 mg by mouth daily.    Marland Kitchen CALCIUM 600 + D PO Oral Take 1 tablet by mouth.    . FUROSEMIDE 40 MG PO TABS Oral Take 40 mg by mouth daily.    Marland Kitchen HYDROCODONE-ACETAMINOPHEN 5-325 MG PO TABS Oral Take 1 tablet by mouth every 6 (six) hours as needed. 75 tablet 5  . IBANDRONATE SODIUM 150 MG PO TABS Oral Take 150 mg by mouth every 30 (thirty) days. Take in the morning with a full glass of water, on an empty stomach, and do not take anything else by mouth or lie down for the next 30 min.    Marland Kitchen METFORMIN HCL 500 MG PO TABS Oral Take 1 tablet (500 mg total) by mouth daily. 30 tablet 11  . NAPROXEN SODIUM ER 375 MG PO TB24 Oral Take 1 tablet (375 mg total) by mouth daily. 30 tablet 11    BP 156/72  Pulse  87  Temp(Src) 98.8 F (37.1 C) (Oral)  Resp 20  Ht 5' (1.524 m)  Wt 232 lb (105.235 kg)  BMI 45.31 kg/m2  SpO2 99%  Physical Exam  Nursing note and vitals reviewed. Constitutional: She is oriented to person, place, and time. She appears well-developed and well-nourished.  HENT:  Head: Normocephalic and atraumatic.  Eyes: Conjunctivae and EOM are normal. Pupils are equal, round, and reactive to light.  Neck: Neck supple.  Cardiovascular: Normal rate and regular rhythm.  Exam reveals no gallop and no friction rub.   No murmur heard. Pulmonary/Chest: Breath sounds normal. She has no wheezes. She has no rales. She exhibits no tenderness.  Abdominal: Soft. Bowel sounds are normal. She exhibits no distension. There is no tenderness. There is no rebound and no guarding.  Musculoskeletal: Normal range of motion. She exhibits edema.       Mild diffuse tenderness to the paracervical muscles and trapezius. Edema and warmth of right upper extremity with normal. Pulses.  Neurological: She is alert and oriented to person, place, and  time. No cranial nerve deficit. Coordination normal.  Skin: Skin is warm and dry. No rash noted.  Psychiatric: She has a normal mood and affect.    ED Course  Procedures (including critical care time)  Labs Reviewed - No data to display No results found.   No diagnosis found.    MDM  Pt is seen and examined;  Initial history and physical completed.  Will follow.    Date: 10/25/2011  Rate: 78  Rhythm: normal sinus rhythm  QRS Axis: left  Intervals: normal  ST/T Wave abnormalities: nonspecific ST/T changes  Conduction Disutrbances:RBBB   Narrative Interpretation:   Old EKG Reviewed: none available  Results for orders placed during the hospital encounter of 10/25/11  CBC      Component Value Range   WBC 8.3  4.0 - 10.5 (K/uL)   RBC 4.13  3.87 - 5.11 (MIL/uL)   Hemoglobin 12.1  12.0 - 15.0 (g/dL)   HCT 16.1  09.6 - 04.5 (%)   MCV 88.4  78.0 - 100.0 (fL)   MCH 29.3  26.0 - 34.0 (pg)   MCHC 33.2  30.0 - 36.0 (g/dL)   RDW 40.9  81.1 - 91.4 (%)   Platelets 267  150 - 400 (K/uL)   Dg Chest 2 View  10/25/2011  *RADIOLOGY REPORT*  Clinical Data: Chest pain, neck pain  CHEST - 2 VIEW  Comparison: None.  Findings: Cardiomediastinal silhouette is unremarkable.  No acute infiltrate or pleural effusion.  No pulmonary edema.  Mild degenerative changes thoracic spine.  Surgical clips are noted in the right axilla.  IMPRESSION: No active disease.  Original Report Authenticated By: Natasha Mead, M.D.   Dg Cervical Spine Complete  10/25/2011  *RADIOLOGY REPORT*  Clinical Data: Pain in arms.  Bilateral neck pain.  CERVICAL SPINE - COMPLETE 4+ VIEW  Comparison: None.  Findings: Normal alignment of the cervical spine.  The vertebral body heights are well preserved.  Disc space narrowing and ventral endplate spurring is noted at C5-6 and C6-7.  No fractures or subluxations identified.  There is mild narrowing of the left C5-6 and C6-C7 neuroforamina.  IMPRESSION:  1.  No acute findings. 2.   Cervical spondylosis and left-sided neural foraminal stenoses.  Original Report Authenticated By: [name], M.D.   Dg Shoulder Right  10/09/2011  *RADIOLOGY REPORT*  Clinical Data: Bilateral shoulder pain.  No known injury.  RIGHT SHOULDER - 2+  VIEW  Comparison: None  Findings: Surgical clips overlie the right axillary region.  There are degenerative changes at the right acromioclavicular joint. Glenohumeral joint degenerative changes are present.  There is no evidence for acute fracture or subluxation.  IMPRESSION:  1.  Postoperative changes 2.  Degenerative changes. 3. No evidence for acute  abnormality.  Original Report Authenticated By: Patterson Hammersmith, M.D.   Dg Shoulder Left  10/09/2011  *RADIOLOGY REPORT*  Clinical Data: Shoulder pain  LEFT SHOULDER - 2+ VIEW  Comparison: None.  Findings: The left humeral head is in normal position.  There is mild acromial spurring and some irregularity of the insertion of the rotator cuff which may indicate changes of impingement and chronic rotator cuff disease.  Mild degenerative change is present at the left [facility] joint.  No acute abnormality is noted.  IMPRESSION: Question of impingement.  No acute abnormality.  Original Report Authenticated By: Juline Patch, M.D.      7:31 PM  Prelim, Duplex US  NEGATIVE FOR DVT.   Results for orders placed during the hospital encounter of 10/25/11  CBC      Component Value Range   WBC 8.3  4.0 - 10.5 (K/uL)   RBC 4.13  3.87 - 5.11 (MIL/uL)   Hemoglobin 12.1  12.0 - 15.0 (g/dL)   HCT 16.1  09.6 - 04.5 (%)   MCV 88.4  78.0 - 100.0 (fL)   MCH 29.3  26.0 - 34.0 (pg)   MCHC 33.2  30.0 - 36.0 (g/dL)   RDW 40.9  81.1 - 91.4 (%)   Platelets 267  150 - 400 (K/uL)  BASIC METABOLIC PANEL      Component Value Range   Sodium 140  135 - 145 (mEq/L)   Potassium 3.8  3.5 - 5.1 (mEq/L)   Chloride 104  96 - 112 (mEq/L)   CO2 28  19 - 32 (mEq/L)   Glucose, Bld 93  70 - 99 (mg/dL)   BUN 15  6 - 23 (mg/dL)    Creatinine, Ser 7.82  0.50 - 1.10 (mg/dL)   Calcium 9.4  8.4 - 95.6 (mg/dL)   GFR calc non Af Amer 85 (*) >90 (mL/min)   GFR calc Af Amer >90  >90 (mL/min)  CARDIAC PANEL(CRET KIN+CKTOT+MB+TROPI)      Component Value Range   Total CK 304 (*) 7 - 177 (U/L)   CK, MB 3.6  0.3 - 4.0 (ng/mL)   Troponin I <0.30  <0.30 (ng/mL)   Relative Index 1.2  0.0 - 2.5    Dg Chest 2 View  10/25/2011  *RADIOLOGY REPORT*  Clinical Data: Chest pain, neck pain  CHEST - 2 VIEW  Comparison: None.  Findings: Cardiomediastinal silhouette is unremarkable.  No acute infiltrate or pleural effusion.  No pulmonary edema.  Mild degenerative changes thoracic spine.  Surgical clips are noted in the right axilla.  IMPRESSION: No active disease.  Original Report Authenticated By: Natasha Mead, M.D.   Dg Cervical Spine Complete  10/25/2011  *RADIOLOGY REPORT*  Clinical Data: Pain in arms.  Bilateral neck pain.  CERVICAL SPINE - COMPLETE 4+ VIEW  Comparison: None.  Findings: Normal alignment of the cervical spine.  The vertebral body heights are well preserved.  Disc space narrowing and ventral endplate spurring is noted at C5-6 and C6-7.  No fractures or subluxations identified.  There is mild narrowing of the left C5-6 and C6-C7 neuroforamina.  IMPRESSION:  1.  No acute findings. 2.  Cervical spondylosis  and left-sided neural foraminal stenoses.  Original Report Authenticated By: [name], M.D.   Dg Shoulder Right  10/09/2011  *RADIOLOGY REPORT*  Clinical Data: Bilateral shoulder pain.  No known injury.  RIGHT SHOULDER - 2+ VIEW  Comparison: None  Findings: Surgical clips overlie the right axillary region.  There are degenerative changes at the right acromioclavicular joint. Glenohumeral joint degenerative changes are present.  There is no evidence for acute fracture or subluxation.  IMPRESSION:  1.  Postoperative changes 2.  Degenerative changes. 3. No evidence for acute  abnormality.  Original Report Authenticated By: Patterson Hammersmith, M.D.   Dg Shoulder Left  10/09/2011  *RADIOLOGY REPORT*  Clinical Data: Shoulder pain  LEFT SHOULDER - 2+ VIEW  Comparison: None.  Findings: The left humeral head is in normal position.  There is mild acromial spurring and some irregularity of the insertion of the rotator cuff which may indicate changes of impingement and chronic rotator cuff disease.  Mild degenerative change is present at the left [facility] joint.  No acute abnormality is noted.  IMPRESSION: Question of impingement.  No acute abnormality.  Original Report Authenticated By: Juline Patch, M.D.     Initial studies unremarkable.  Will discuss with her oncologist.  Likely stable for out pt f/u.     Discussed with Oncology, Mohammad.  Agrees with outpt f/u.    At this time. Patient appears to be quite stable. Her vital signs are normal. She has no active chest pain or dyspnea. The right arm swelling is concerning, but no DVT. Will be stable to followup with her primary care physician this week and with her oncologist, Dr. Darnelle Catalan       Pt feels much better.  REquests pain meds and something for anxiety. Can f/u with her doctors this week.  Will be discharged home in stable improved condition. Very low suspicion for ACS. Mostly combination of neck pain and right arm swelling, and anxiety. Patient was encouraged to see her doctors. This week and was told to come back to the ER for any concerns or changing symptoms       Chynah Orihuela A. Patrica Duel, MD 10/25/11 2157

## 2011-10-25 NOTE — ED Notes (Signed)
Patient reports that she has had several days of bilateral arm aching and just not feeling well. Reports swelling to right arm for same, ekg and cbg done on arrival. No neuro deficits noted on exam

## 2011-10-25 NOTE — Discharge Instructions (Signed)
Cervical Radiculopathy Cervical radiculopathy happens when a nerve in the neck is pinched or bruised by a slipped (herniated) disk or by arthritic changes in the bones of the cervical spine. This can occur due to an injury or as part of the normal aging process. Pressure on the cervical nerves can cause pain or numbness that runs from your neck all the way down into your arm and fingers. CAUSES  There are many possible causes, including:  Injury.   Muscle tightness in the neck from overuse.   Swollen, painful joints (arthritis).   Breakdown or degeneration in the bones and joints of the spine (spondylosis) due to aging.   Bone spurs that may develop near the cervical nerves.  SYMPTOMS  Symptoms include pain, weakness, or numbness in the affected arm and hand. Pain can be severe or irritating. Symptoms may be worse when extending or turning the neck. DIAGNOSIS  Your caregiver will ask about your symptoms and do a physical exam. He or she may test your strength and reflexes. X-rays, CT scans, and MRI scans may be needed in cases of injury or if the symptoms do not go away after a period of time. Electromyography (EMG) or nerve conduction testing may be done to study how your nerves and muscles are working. TREATMENT  Your caregiver may recommend certain exercises to help relieve your symptoms. Cervical radiculopathy can, and often does, get better with time and treatment. If your problems continue, treatment options may include:  Wearing a soft collar for short periods of time.   Physical therapy to strengthen the neck muscles.   Medicines, such as nonsteroidal anti-inflammatory drugs (NSAIDs), oral corticosteroids, or spinal injections.   Surgery. Different types of surgery may be done depending on the cause of your problems.  HOME CARE INSTRUCTIONS   Put ice on the affected area.   Put ice in a plastic bag.   Place a towel between your skin and the bag.   Leave the ice on for 15  to 20 minutes, 3 to 4 times a day or as directed by your caregiver.   Use a flat pillow when you sleep.   Only take over-the-counter or prescription medicines for pain, discomfort, or fever as directed by your caregiver.   If physical therapy was prescribed, follow your caregiver's directions.   If a soft collar was prescribed, use it as directed.  SEEK IMMEDIATE MEDICAL CARE IF:   Your pain gets much worse and cannot be controlled with medicines.   You have weakness or numbness in your hand, arm, face, or leg.   You have a high fever or a stiff, rigid neck.   You lose bowel or bladder control (incontinence).   You have trouble with walking, balance, or speaking.  MAKE SURE YOU:   Understand these instructions.   Will watch your condition.   Will get help right away if you are not doing well or get worse.  Document Released: 04/21/2001 Document Revised: 07/16/2011 Document Reviewed: 03/10/2011 [facility] Patient Information 2012 [place], [place].   Chest Pain (Nonspecific) It is often hard to give a specific diagnosis for the cause of chest pain. There is always a chance that your pain could be related to something serious, such as a heart attack or a blood clot in the lungs. You need to follow up with your caregiver for further evaluation. CAUSES   Heartburn.   Pneumonia or bronchitis.   Anxiety or stress.   Inflammation around your heart (pericarditis) or lung (  pleuritis or pleurisy).   A blood clot in the lung.   A collapsed lung (pneumothorax). It can develop suddenly on its own (spontaneous pneumothorax) or from injury (trauma) to the chest.   Shingles infection (herpes zoster virus).  The chest wall is composed of bones, muscles, and cartilage. Any of these can be the source of the pain.  The bones can be bruised by injury.   The muscles or cartilage can be strained by coughing or overwork.   The cartilage can be affected by inflammation and become sore  (costochondritis).  DIAGNOSIS  Lab tests or other studies, such as X-rays, electrocardiography, stress testing, or cardiac imaging, may be needed to find the cause of your pain.  TREATMENT   Treatment depends on what may be causing your chest pain. Treatment may include:   Acid blockers for heartburn.   Anti-inflammatory medicine.   Pain medicine for inflammatory conditions.   Antibiotics if an infection is present.   You may be advised to change lifestyle habits. This includes stopping smoking and avoiding alcohol, caffeine, and chocolate.   You may be advised to keep your head raised (elevated) when sleeping. This reduces the chance of acid going backward from your stomach into your esophagus.   Most of the time, nonspecific chest pain will improve within 2 to 3 days with rest and mild pain medicine.  HOME CARE INSTRUCTIONS   If antibiotics were prescribed, take your antibiotics as directed. Finish them even if you start to feel better.   For the next few days, avoid physical activities that bring on chest pain. Continue physical activities as directed.   Do not smoke.   Avoid drinking alcohol.   Only take over-the-counter or prescription medicine for pain, discomfort, or fever as directed by your caregiver.   Follow your caregiver's suggestions for further testing if your chest pain does not go away.   Keep any follow-up appointments you made. If you do not go to an appointment, you could develop lasting (chronic) problems with pain. If there is any problem keeping an appointment, you must call to reschedule.  SEEK MEDICAL CARE IF:   You think you are having problems from the medicine you are taking. Read your medicine instructions carefully.   Your chest pain does not go away, even after treatment.   You develop a rash with blisters on your chest.  SEEK IMMEDIATE MEDICAL CARE IF:   You have increased chest pain or pain that spreads to your arm, neck, jaw, back, or  abdomen.   You develop shortness of breath, an increasing cough, or you are coughing up blood.   You have severe back or abdominal pain, feel nauseous, or vomit.   You develop severe weakness, fainting, or chills.   You have a fever.  THIS IS AN EMERGENCY. Do not wait to see if the pain will go away. Get medical help at once. Call your local emergency services (911 in [place] Do not drive yourself to the hospital. MAKE SURE YOU:   Understand these instructions.   Will watch your condition.   Will get help right away if you are not doing well or get worse.  Document Released: 05/06/2005 Document Revised: 07/16/2011 Document Reviewed: 03/01/2008 [facility] Patient Information 2012 [place], [place].    Lymphedema Lymphedema is a swelling caused by the abnormal collection of lymph under the skin. The lymph is fluid from the tissues in your body that travels in the lymphatic system. This system is part of the  immune system that includes lymph nodes and vessels. The lymph vessels collect and carry the excess fluid, fats, proteins, and wastes from the tissues of the body to the bloodstream. This system also works to clean and remove bacteria and waste products from the body.  Lymphedema occurs when the lymphatic system is blocked. When the lymph vessels or lymph nodes are blocked or damaged, lymph does not drain properly. This causes abnormal build up of lymph. This leads to swelling in the arms or legs. Lymphedema cannot be cured by medicines. But the swelling can be reduced by physical methods. CAUSES  There are two types of Lymphedema. Primary lymphedema is caused by the absence or abnormality of the lymph vessel at birth. It is also known as inherited lymphedema, which occurs rarely. Secondary or acquired lymphedema occurs when the lymph vessel is damaged or blocked. The causes of lymph vessel blockage are:   Skin infection like cellulites.   Infection by parasites (filariasis).   Injury.     Cancer.   Radiation therapy.   Formation of scar tissue.   Surgery.  SYMPTOMS  The symptoms of lymphedema are:  Abnormal swelling of the arm or leg.   Heavy or tight feeling in your arm or leg.   Tight-fitting shoes or rings.   Redness of skin over the affected area.   Limited movement of the affected limb.   Some patients complain about sensitivity to touch and discomfort in the limb(s) affected.  You may not have these symptoms immediately following injury. They usually appear within a few days or even years after injury. Inform your caregiver, if you have any of these symptoms. Early treatment can avoid further problems.  DIAGNOSIS  First, your caregiver will inquire about any surgery you have had or medicines you are taking. He will then examine you. Your caregiver may order special imaging tests, such as:  Lymphoscintigraphy (a test in which a low dose of radioactive substance is injected to trace the flow of lymph through the lymph vessels).   MRI (imaging tests using sound waves).   Computed tomography (test using special cross-sectional X-rays).   Duplex ultrasound (test using high-frequency sound waves to show the vessels and the blood flow on a screen).   Lymphangiography (special X-ray taken after injecting a contrast dye into the lymph vessel). It is now rarely done.  TREATMENT  Lymphedema can be treated in different ways. Your caregiver will decide the type of treatment depending on the cause. Treatment may include:  Exercise: Special exercises will help fluid move out easily from the affected part. This should be done as per your caregiver's advice.   Manual lymph drainage: Gentle massage of the affected limb makes the fluid to move out more freely.   Compression: Compression stockings or external pump apply pressure over the affected limb. This helps the fluid to move out from the arm or leg. Bandaging can also help to move the fluid out from the affected  part.  Your caregiver will decide the method that suits you the most.   Medicines: Your caregiver may prescribe antibiotics, if you have infection.   Surgery: Your caregiver may advise surgery for severe lymphedema. It is reserved for special cases when the patient has difficulty moving. Your surgeon may remove excess tissue from the arm or leg. This will help to ease your movement. Physical therapy may have to be continued after surgery.  HOME CARE INSTRUCTIONS   Eat a healthy diet.   Exercise regularly as  per advice.   Keep the affected area clean and dry.   Use gloves while cooking or gardening.   Protect your skin from cuts.   Use electric razor to shave the affected area.   Keep affected limb elevated.   Do not wear tight clothes, shoes, or jewels as it may cause the tissue to be strangled.   Do not use heat pads over the affected area.   Do not sit with cross legs.   Do not walk barefoot.   Do not carry weight on the affected arm.   Avoid having blood pressure checked on the affected limb.   The area is very fragile and is predisposed to injury and infection.  SEEK MEDICAL CARE IF:  You continue to have swelling in your limb. SEEK IMMEDIATE MEDICAL CARE IF:   You have high fever.   You have skin rash.   You have chills or sweats.   You have pain or redness.   You have a cut that does not heal.  MAKE SURE YOU:   Understand these instructions.   Will watch your condition.   Will get help right away if you are not doing well or get worse.  Document Released: 05/24/2007 Document Revised: 07/16/2011 Document Reviewed: 04/29/2009 [facility] Patient Information 2012 [place], [place].

## 2011-10-25 NOTE — Progress Notes (Signed)
VASCULAR LAB PRELIMINARY  PRELIMINARY  PRELIMINARY  PRELIMINARY  Right upper extremity venous Doppler completed.    Preliminary report:  There is no DVT or SVT noted in the right upper extremity.  Christy Harrell, 10/25/2011, 7:25 PM

## 2011-10-26 ENCOUNTER — Telehealth: Payer: Self-pay | Admitting: Oncology

## 2011-10-26 ENCOUNTER — Telehealth: Payer: Self-pay | Admitting: *Deleted

## 2011-10-26 ENCOUNTER — Other Ambulatory Visit: Payer: Self-pay | Admitting: Oncology

## 2011-10-26 DIAGNOSIS — C50919 Malignant neoplasm of unspecified site of unspecified female breast: Secondary | ICD-10-CM

## 2011-10-26 LAB — GLUCOSE, CAPILLARY: Glucose-Capillary: 94 mg/dL (ref 70–99)

## 2011-10-26 NOTE — Telephone Encounter (Signed)
Pt. Went to ED last night due to pain and swelling in neck and right arm.  Notes from visit reviewed by Dr. Darnelle Catalan and pt. Referral to [facility] for physical therapy.  Pt. Notified and she knows that they should call her the next 3 days for an appt.  She also knows to keep her appt. With  [place] on 4/11 for lab and 4/18 to see Amy Allyson Sabal

## 2011-10-26 NOTE — Telephone Encounter (Signed)
S/w April from the lymphedema clinic and she is aware of the referral for this pt.

## 2011-10-27 LAB — URINE CULTURE

## 2011-10-28 ENCOUNTER — Encounter: Payer: Self-pay | Admitting: Internal Medicine

## 2011-10-28 ENCOUNTER — Ambulatory Visit (INDEPENDENT_AMBULATORY_CARE_PROVIDER_SITE_OTHER): Payer: Medicare Other | Admitting: Internal Medicine

## 2011-10-28 VITALS — BP 106/68 | HR 80 | Temp 98.5°F | Resp 16 | Wt 232.0 lb

## 2011-10-28 DIAGNOSIS — M4680 Other specified inflammatory spondylopathies, site unspecified: Secondary | ICD-10-CM

## 2011-10-28 DIAGNOSIS — M4802 Spinal stenosis, cervical region: Secondary | ICD-10-CM | POA: Insufficient documentation

## 2011-10-28 DIAGNOSIS — L03113 Cellulitis of right upper limb: Secondary | ICD-10-CM | POA: Insufficient documentation

## 2011-10-28 DIAGNOSIS — IMO0002 Reserved for concepts with insufficient information to code with codable children: Secondary | ICD-10-CM

## 2011-10-28 DIAGNOSIS — M4692 Unspecified inflammatory spondylopathy, cervical region: Secondary | ICD-10-CM

## 2011-10-28 MED ORDER — CEFUROXIME AXETIL 500 MG PO TABS: 500.0000 mg | ORAL_TABLET | Freq: Two times a day (BID) | ORAL | Status: AC

## 2011-10-28 NOTE — Assessment & Plan Note (Signed)
MRI and pain management referral

## 2011-10-28 NOTE — Assessment & Plan Note (Signed)
I think she has cellulitis and lymphedema in her RUE so I have asked her to start ceftin and I see that Dr. Darnelle Catalan has referred her to the lymphedema clinic for therapy and hopefully she will get a compression garment

## 2011-10-28 NOTE — Patient Instructions (Signed)

## 2011-10-28 NOTE — Assessment & Plan Note (Signed)
Recent a1c looks good 

## 2011-10-28 NOTE — Progress Notes (Signed)
Subjective:    Patient ID: Christy Harrell, female    DOB: 12-09-1940, 71 y.o.   MRN: [id number]  Neck Pain  This is a chronic problem. The current episode started more than 1 month ago. The problem occurs constantly. The problem has been gradually worsening. The pain is associated with nothing. The pain is present in the midline. The quality of the pain is described as aching. The pain is at a severity of 6/10. The pain is moderate. The symptoms are aggravated by nothing. The pain is worse during the day. Stiffness is present all day. Pertinent negatives include no chest pain, fever, headaches, leg pain, numbness, pain with swallowing, paresis, photophobia, syncope, tingling, trouble swallowing, visual change, weakness or weight loss. She has tried NSAIDs and oral narcotics for the symptoms. The treatment provided moderate relief.      Review of Systems  Constitutional: Negative for fever, chills, weight loss, diaphoresis, activity change, appetite change, fatigue and unexpected weight change.  HENT: Positive for neck pain. Negative for trouble swallowing.   Eyes: Negative.  Negative for photophobia.  Respiratory: Negative for apnea, cough, choking, shortness of breath, wheezing and stridor.   Cardiovascular: Negative for chest pain, palpitations, leg swelling and syncope.  Gastrointestinal: Negative for nausea, vomiting, abdominal pain, diarrhea, constipation, blood in stool and abdominal distention.  Genitourinary: Negative.   Musculoskeletal: Negative for myalgias, joint swelling, arthralgias and gait problem.  Skin: Positive for color change (she has redness and swelling in her RUE). Negative for pallor, rash and wound.  Neurological: Negative for dizziness, tingling, seizures, syncope, facial asymmetry, speech difficulty, weakness, light-headedness, numbness and headaches.  Hematological: Negative for adenopathy. Does not bruise/bleed easily.  Psychiatric/Behavioral: Negative.       Objective:   Physical Exam  Vitals reviewed. Constitutional: She is oriented to person, place, and time. She appears well-developed and well-nourished. No distress.  HENT:  Head: Normocephalic and atraumatic.  Mouth/Throat: Oropharynx is clear and moist. No oropharyngeal exudate.  Eyes: Conjunctivae are normal. Right eye exhibits no discharge. Left eye exhibits no discharge. No scleral icterus.  Neck: Normal range of motion. Neck supple. No JVD present. No tracheal deviation present. No thyromegaly present.  Cardiovascular: Normal rate, regular rhythm, normal heart sounds and intact distal pulses.  Exam reveals no gallop and no friction rub.   No murmur heard. Pulmonary/Chest: Effort normal and breath sounds normal. No stridor. No respiratory distress. She has no wheezes. She has no rales. She exhibits no tenderness.  Abdominal: Soft. Bowel sounds are normal. She exhibits no distension and no mass. There is no tenderness. There is no rebound and no guarding.  Musculoskeletal: Normal range of motion. She exhibits edema (mild swelling in the RUE>LUE). She exhibits no tenderness.       Cervical back: Normal. She exhibits normal range of motion, no tenderness, no bony tenderness, no swelling, no edema, no deformity, no laceration, no pain, no spasm and normal pulse.  Lymphadenopathy:       Head (right side): No submental, no submandibular, no tonsillar, no preauricular, no posterior auricular and no occipital adenopathy present.       Head (left side): No submental, no submandibular, no tonsillar, no preauricular, no posterior auricular and no occipital adenopathy present.    She has no cervical adenopathy.       Right cervical: No superficial cervical, no deep cervical and no posterior cervical adenopathy present.      Left cervical: No superficial cervical, no deep cervical and no posterior cervical adenopathy  present.    She has no axillary adenopathy.       Right axillary: No pectoral and no  lateral adenopathy present.       Left axillary: No pectoral and no lateral adenopathy present. Neurological: She is oriented to person, place, and time.  Skin: Skin is warm, dry and intact. No abrasion, no bruising, no ecchymosis, no laceration, no lesion, no petechiae, no purpura and no rash noted. Rash is not macular, not papular, not nodular, not pustular, not vesicular and not urticarial. She is not diaphoretic. There is erythema. No pallor.          She has faint erythema and swelling in her RUE with no evidence of induration, wounds, vesicles, exudate, fluctuance  Psychiatric: She has a normal mood and affect. Her behavior is normal. Judgment and thought content normal.      Lab Results  Component Value Date   WBC 8.3 10/25/2011   HGB 12.1 10/25/2011   HCT 36.5 10/25/2011   PLT 267 10/25/2011   GLUCOSE 93 10/25/2011   CHOL 255* 09/03/2011   TRIG 121.0 09/03/2011   HDL 67.60 09/03/2011   LDLDIRECT 174.5 09/03/2011   ALT 10 08/26/2011   AST 14 08/26/2011   NA 140 10/25/2011   K 3.8 10/25/2011   CL 104 10/25/2011   CREATININE 0.72 10/25/2011   BUN 15 10/25/2011   CO2 28 10/25/2011   HGBA1C 6.0 09/03/2011  Dg Chest 2 View  10/25/2011  *RADIOLOGY REPORT*  Clinical Data: Chest pain, neck pain  CHEST - 2 VIEW  Comparison: None.  Findings: Cardiomediastinal silhouette is unremarkable.  No acute infiltrate or pleural effusion.  No pulmonary edema.  Mild degenerative changes thoracic spine.  Surgical clips are noted in the right axilla.  IMPRESSION: No active disease.  Original Report Authenticated By: Natasha Mead, M.D.   Dg Cervical Spine Complete  10/25/2011  *RADIOLOGY REPORT*  Clinical Data: Pain in arms.  Bilateral neck pain.  CERVICAL SPINE - COMPLETE 4+ VIEW  Comparison: None.  Findings: Normal alignment of the cervical spine.  The vertebral body heights are well preserved.  Disc space narrowing and ventral endplate spurring is noted at C5-6 and C6-7.  No fractures or subluxations identified.   There is mild narrowing of the left C5-6 and C6-C7 neuroforamina.  IMPRESSION:  1.  No acute findings. 2.  Cervical spondylosis and left-sided neural foraminal stenoses.  Original Report Authenticated By: [name], M.D.     Assessment & Plan:

## 2011-10-28 NOTE — Assessment & Plan Note (Signed)
I have asked her to get an MRI done to assess the degree of damage to the cerivcal spine and spinal cord/nerves, also have asked her to see pain mngt to see if an EPSI will help

## 2011-11-03 ENCOUNTER — Telehealth: Payer: Self-pay | Admitting: *Deleted

## 2011-11-03 NOTE — Telephone Encounter (Signed)
Pt called to this RN late pm 3/25 inquiring about referral to lymphedema clinic.  Christy Harrell states arm is having increased swelling interfering with ADL's .  This RN called to the lymphedema clinic per note that referral was made last week per our scheduler.  Christy Harrell states she has referral " but is working thru a Psychiatric nurse ".  This RN discussed with Christy Harrell urgency of situation and requesting an appointment ASAP. Christy Harrell found referral and stated she will contact pt today.

## 2011-11-13 ENCOUNTER — Other Ambulatory Visit (HOSPITAL_COMMUNITY): Payer: Medicare Other

## 2011-11-16 ENCOUNTER — Ambulatory Visit: Payer: Medicare Other | Attending: Oncology | Admitting: Physical Therapy

## 2011-11-16 DIAGNOSIS — I890 Lymphedema, not elsewhere classified: Secondary | ICD-10-CM | POA: Insufficient documentation

## 2011-11-16 DIAGNOSIS — IMO0001 Reserved for inherently not codable concepts without codable children: Secondary | ICD-10-CM | POA: Insufficient documentation

## 2011-11-19 ENCOUNTER — Other Ambulatory Visit: Payer: Self-pay | Admitting: Physician Assistant

## 2011-11-19 ENCOUNTER — Other Ambulatory Visit (HOSPITAL_BASED_OUTPATIENT_CLINIC_OR_DEPARTMENT_OTHER): Payer: Medicare Other | Admitting: Lab

## 2011-11-19 DIAGNOSIS — C50919 Malignant neoplasm of unspecified site of unspecified female breast: Secondary | ICD-10-CM

## 2011-11-19 LAB — CBC WITH DIFFERENTIAL/PLATELET
Basophils Absolute: 0 10*3/uL (ref 0.0–0.1)
EOS%: 2.5 % (ref 0.0–7.0)
HGB: 12.3 g/dL (ref 11.6–15.9)
MCH: 29.5 pg (ref 25.1–34.0)
NEUT#: 3.4 10*3/uL (ref 1.5–6.5)
RDW: 14.4 % (ref 11.2–14.5)
WBC: 6.5 10*3/uL (ref 3.9–10.3)
lymph#: 2.3 10*3/uL (ref 0.9–3.3)

## 2011-11-20 LAB — COMPREHENSIVE METABOLIC PANEL
AST: 17 U/L (ref 0–37)
Albumin: 3.8 g/dL (ref 3.5–5.2)
Alkaline Phosphatase: 62 U/L (ref 39–117)
Glucose, Bld: 98 mg/dL (ref 70–99)
Potassium: 4 mEq/L (ref 3.5–5.3)
Sodium: 140 mEq/L (ref 135–145)
Total Protein: 6.8 g/dL (ref 6.0–8.3)

## 2011-11-26 ENCOUNTER — Ambulatory Visit (HOSPITAL_BASED_OUTPATIENT_CLINIC_OR_DEPARTMENT_OTHER): Payer: Medicare Other | Admitting: Physician Assistant

## 2011-11-26 ENCOUNTER — Telehealth: Payer: Self-pay | Admitting: Oncology

## 2011-11-26 ENCOUNTER — Ambulatory Visit: Payer: Medicare Other | Admitting: Oncology

## 2011-11-26 ENCOUNTER — Encounter: Payer: Self-pay | Admitting: Physician Assistant

## 2011-11-26 VITALS — BP 111/72 | HR 80 | Temp 99.0°F | Ht 60.0 in | Wt 231.3 lb

## 2011-11-26 DIAGNOSIS — Z170 Estrogen receptor positive status [ER+]: Secondary | ICD-10-CM

## 2011-11-26 DIAGNOSIS — I890 Lymphedema, not elsewhere classified: Secondary | ICD-10-CM

## 2011-11-26 DIAGNOSIS — L03113 Cellulitis of right upper limb: Secondary | ICD-10-CM

## 2011-11-26 DIAGNOSIS — Z79811 Long term (current) use of aromatase inhibitors: Secondary | ICD-10-CM

## 2011-11-26 DIAGNOSIS — C50919 Malignant neoplasm of unspecified site of unspecified female breast: Secondary | ICD-10-CM

## 2011-11-26 MED ORDER — CEPHALEXIN 500 MG PO CAPS: 500.0000 mg | ORAL_CAPSULE | Freq: Three times a day (TID) | ORAL | Status: DC

## 2011-11-26 MED ORDER — ANASTROZOLE 1 MG PO TABS: 1.0000 mg | ORAL_TABLET | Freq: Every day | ORAL | Status: DC

## 2011-11-26 NOTE — Telephone Encounter (Signed)
gve the pt her June 2013 appt calendar 

## 2011-11-26 NOTE — Progress Notes (Signed)
ID: Christy Harrell   DOB: 1940/08/29  MR#: [id number]  WUJ#:[id number]  HISTORY OF PRESENT ILLNESS: The patient had routine screening mammography 10/16/2010 showing a lobulated mass in the right subareolar region measuring up to 5.5 cm. The Left breast showed some central microcalcifications. Biopsy of the Right breast mass on 10/28/2010 showed an invasive ductal carcinoma, grade 2, estrogen receptor positive (Allred score 8), progesterone receptor positive (Allred score 5), with an equivocal HER-2. The Left breast area of microcalcifications was biopsied at the same time, and was read as suspicious for DCIS.  On 11/17/2010 the patient underwent Right lumpectomy and axillary lymph node dissection for what proved to be a 3 cm invasive ductal carcinoma, grade 2, with some papillary and mucinous features. One of 16 lymph nodes was involved. FISH showed no HER-2 amplification. Left breast biopsy was benign.  The patient had an Oncotype sent, with a score of 27, predicting a risk of distant recurrence after 5 years of tamoxifen in the 18% range. With this intermediate result, the decision was made not to proceed with chemotherapy, since the patient is the sole caregiver to her very elderly mother. Instead the patient proceeded to radiation treatment which was completed 03/06/2011. She started anastrozole at that point.  INTERVAL HISTORY: Christy Harrell returns today for routine three-month followup of her right breast carcinoma. She continues on anastrozole with good tolerance. She has no significant hot flashes, and no increased joint pain. No vaginal dryness.  Interval history is remarkable for the apparent development of lymphedema in the right upper extremity. This occurred in mid March, a little over 4 weeks secondary. She was seen in the emergency room. A Doppler was negative for DVT. She's also been seen by her primary care physician, Dr. Sanda Harrell and completed a course of antibiotics one month ago. She's  been seen at the lymphedema clinic, and is in the process of being scheduled for her 6 weeks of visits, 3 times weekly, to have her arm wrapped. This has been slightly difficult since she does not drive and does not have a car. She is working on the transportation issue with our Child psychotherapist, Teacher, adult education.  REVIEW OF SYSTEMS: Otherwise, the patient denies any recent illnesses as it had no fever, chills, or night sweats. Her right upper extremity has been red, but she denies any additional skin changes elsewhere. No additional peripheral swelling. As No abnormal bleeding. No nausea or change in bowel habits. No chest pain or shortness of breath. No cough phlegm production. No abnormal headaches or dizziness. She does feel tight and uncomfortable in the right upper treatment, but denies any additional pain.  Otherwise a detailed review of systems is noncontributory.   PAST MEDICAL HISTORY: Past Medical History  Diagnosis Date  . Cancer     breast  . Club foot   . Arthritis   . Thyroid disease   . Hyperlipidemia   . Diabetes mellitus   . Osteoporosis     PAST SURGICAL HISTORY: Past Surgical History  Procedure Date  . Breast surgery   . Cesarean section   . Thyroidectomy     FAMILY HISTORY Family History  Problem Relation Age of Onset  . Cancer Neg Hx   . Arthritis Neg Hx   . Heart disease Neg Hx   . Hyperlipidemia Neg Hx   . Hypertension Neg Hx     GYNECOLOGIC HISTORY: Menarche age 67, menopause in her early 45s. The patient is GX P1, first pregnancy to term age 29.  She did not take hormone replacement   SOCIAL HISTORY:  The patient grew up in [place], [employer], then moved to [place] where she lived about 40 years. 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 Her mother, Christy Harrell, 36, lives with the patient. The patient's daughter Christy Harrell, 74, is disabled secondary to pulmonary  hypertension. She can be reached at [phone]. The patient has 2 grandchildren, Christy Harrell who works at a Human resources officer business and is 71 years old, ans Christy Harrell, 30, completing high school  6   ADVANCED DIRECTIVES:  HEALTH MAINTENANCE: History  Substance Use Topics  . Smoking status: Former Games developer  . Smokeless tobacco: Never Used  . Alcohol Use: No     Colonoscopy:  PAP:  Bone density:  Lipid panel: Under treatment  No Known Allergies  Current Outpatient Prescriptions  Medication Sig Dispense Refill  . anastrozole (ARIMIDEX) 1 MG tablet Take 1 tablet (1 mg total) by mouth daily.  90 tablet  3  . atorvastatin (LIPITOR) 10 MG tablet Take 10 mg by mouth daily.      . Calcium Carbonate-Vitamin D (CALCIUM 600 + D PO) Take 1 tablet by mouth.      . cephALEXin (KEFLEX) 500 MG capsule Take 1 capsule (500 mg total) by mouth 3 (three) times daily.  30 capsule  0  . furosemide (LASIX) 40 MG tablet Take 40 mg by mouth daily.      Marland Kitchen HYDROcodone-acetaminophen (NORCO) 5-325 MG per tablet Take 1 tablet by mouth every 6 (six) hours as needed.  75 tablet  5  . levothyroxine (SYNTHROID, LEVOTHROID) 112 MCG tablet Take 112 mcg by mouth daily.      . metFORMIN (GLUCOPHAGE) 500 MG tablet Take 500 mg by mouth daily.      . Naproxen Sodium (NAPRELAN) 375 MG TB24 Take 1 tablet (375 mg total) by mouth daily.  30 tablet  11  . Polyethyl Glycol-Propyl Glycol (SYSTANE ULTRA OP) Place 1 drop into both eyes daily.        OBJECTIVE: Filed Vitals:   11/26/11 1139  BP: 111/72  Pulse: 80  Temp: 99 F (37.2 C)     Body mass index is 45.17 kg/(m^2).    ECOG FS: 1  Physical Exam: HEENT:  Sclerae anicteric, conjunctivae pink.  Oropharynx clear.  No mucositis or candidiasis.   Nodes:  No cervical, supraclavicular, or axillary lymphadenopathy palpated.  Breast Exam:  Fullness is noted in the right breast, but no suspicious nodularities or skin changes indicative of local recurrence. Breast is status post  lumpectomy. Left breast unremarkable, with no masses, skin changes, or nipple inversion. Lungs:  Clear to auscultation bilaterally.  No crackles, rhonchi, or wheezes.   Heart:  Regular rate and rhythm.   Abdomen:  Soft, obese, nontender.  Positive bowel sounds.  No organomegaly or masses palpated.   Musculoskeletal:  No focal spinal tenderness to palpation.  Extremities:  1+ pitting edema in the right upper extremity. Nonpitting pedal edema, equal bilaterally. No peripheral cyanosis. Skin:  Erythema is noted in the right upper extremity, especially in the hand and forearm. Skin is otherwise benign.   Neuro:  Nonfocal.  Alert and oriented x3.    LAB RESULTS: Lab Results  Component Value Date   WBC 6.5 11/19/2011   NEUTROABS 3.4 11/19/2011   HGB 12.3 11/19/2011   HCT 36.3 11/19/2011   MCV 87.1 11/19/2011   PLT 273 11/19/2011  Chemistry      Component Value Date/Time   NA 140 11/19/2011 1326   K 4.0 11/19/2011 1326   CL 108 11/19/2011 1326   CO2 23 11/19/2011 1326   BUN 18 11/19/2011 1326   CREATININE 0.77 11/19/2011 1326      Component Value Date/Time   CALCIUM 9.5 11/19/2011 1326   ALKPHOS 62 11/19/2011 1326   AST 17 11/19/2011 1326   ALT 9 11/19/2011 1326   BILITOT 0.4 11/19/2011 1326     Vitamin D  was slightly low at 25 on 11/19/2011.  Lab Results  Component Value Date   LABCA2 24 11/19/2011    STUDIES: Mm Digital Diagnostic Bilat  08/18/2011 *RADIOLOGY REPORT* Clinical Data: The patient underwent right lumpectomy and radiation therapy for breast cancer in 2012. She underwent left surgical excisional biopsy with benign results in 2012. DIGITAL DIAGNOSTIC BILATERAL MAMMOGRAM WITH CAD Comparison: 10/16/2010 from [facility] in [place], [place] Findings: The breast tissue is almost entirely fatty. Right lumpectomy and radiation therapy changes are noted. Left excisional biopsy changes are noted. There is no dominant mass, nonsurgical architectural distortion or  calcification to suggest malignancy. Mammographic images were processed with CAD. IMPRESSION: No mammographic evidence of malignancy. Yearly diagnostic mammography is suggested. BI-RADS CATEGORY 2: Benign finding(s). Original Report Authenticated By: Daryl Eastern, M.D.    ASSESSMENT: 71 year old [place] woman   (1)  status post right lumpectomy and axillary lymph node dissection 11/17/2010 for a pT2 pN1, stage IIb invasive ductal carcinoma, grade 2, estrogen and progesterone receptor positive, HER-2 negative, with an Oncotype DX recurrence score of 27 predicting a risk of distant recurrence of 18% with 5 years of tamoxifen (intermediate score);   (2)  status post radiation completed July of 2012,   (3)   the patient started anastrozole in July 2012, and continues with good tolerance  (4)  Lymphedema in RUE, possibly with early cellulitis  PLAN: Ms Kovacich will be meeting with our social worker following this appointment today to see if they might arrange transportation assistance and schedule her for her necessary lymphedema clinic appointments. I am hopeful that we'll be able to get her compression sleeve soon. I will mention that she is planning to fly to [place] in mid June, so hopefully they will be able to get the lymphedema under control prior to that trip. I am also starting her on Keflex, 500 mg by mouth 3 times a day for 10 days for an apparent cellulitis in the right upper extremity.  Ms. Holsapple will be continuing on anastrozole which I have refilled today. She will return to see [place] in 6 weeks, primarily to followup on the issues with the right upper extremity. If she is doing well, we will then proceed to normal followup every 3 months. She voices understanding and agreement with our plan will call the problems.  Of note I have also recommended that she began on vitamin D 3 supplements, 1000 units by mouth daily due to mild vitamin D deficiency.  Envy Meno    11/26/2011

## 2011-11-27 ENCOUNTER — Encounter: Payer: Self-pay | Admitting: *Deleted

## 2011-11-27 NOTE — Progress Notes (Signed)
[facility] Brief Psychosocial Assessment Clinical Social Work  Clinical Social Work was referred by patient for assessment of psychosocial needs.  Clinical Social Worker contacted patient at home to offer support and assess for needs.  Pt informed CSW that she in need of transportation resources.  Pt will need to visit the lymphedema clinic 3 times a week for the next 6 weeks, but does not have a transportation source.  CSW and pt discussed possible transportation resources.  Pt currently uses the [employer], but they can only provided transportation 1 time a week.  CSW informed pt of the [facility] program, and pt agreed to CSW making referral.  CSW also complete [employer] application and faxed to appropriate contact.  [facility] will contact pt to complete the application process.  Pt is aware of how to contact [facility] and verbalized understanding of application process.  CSW will continue to support pt and encouraged her to call with any questions or concerns.            Patient identified barriers to care as  transportation  Clinical Social Work interventions: [facility]   Tamala Julian, MSW, LCSW Clinical Social Worker [facility] [phone]

## 2011-12-04 ENCOUNTER — Ambulatory Visit (HOSPITAL_BASED_OUTPATIENT_CLINIC_OR_DEPARTMENT_OTHER): Payer: Medicare Other | Admitting: Physical Medicine & Rehabilitation

## 2011-12-04 ENCOUNTER — Encounter: Payer: Self-pay | Admitting: Physical Medicine & Rehabilitation

## 2011-12-04 ENCOUNTER — Encounter: Payer: Medicare Other | Attending: Physical Medicine & Rehabilitation

## 2011-12-04 VITALS — BP 154/87 | HR 86 | Ht 60.0 in | Wt 230.0 lb

## 2011-12-04 DIAGNOSIS — M2550 Pain in unspecified joint: Secondary | ICD-10-CM | POA: Insufficient documentation

## 2011-12-04 DIAGNOSIS — IMO0001 Reserved for inherently not codable concepts without codable children: Secondary | ICD-10-CM

## 2011-12-04 DIAGNOSIS — E119 Type 2 diabetes mellitus without complications: Secondary | ICD-10-CM | POA: Insufficient documentation

## 2011-12-04 DIAGNOSIS — M19019 Primary osteoarthritis, unspecified shoulder: Secondary | ICD-10-CM | POA: Insufficient documentation

## 2011-12-04 DIAGNOSIS — M549 Dorsalgia, unspecified: Secondary | ICD-10-CM | POA: Insufficient documentation

## 2011-12-04 DIAGNOSIS — M47812 Spondylosis without myelopathy or radiculopathy, cervical region: Secondary | ICD-10-CM | POA: Insufficient documentation

## 2011-12-04 DIAGNOSIS — G8929 Other chronic pain: Secondary | ICD-10-CM | POA: Insufficient documentation

## 2011-12-04 DIAGNOSIS — M810 Age-related osteoporosis without current pathological fracture: Secondary | ICD-10-CM | POA: Insufficient documentation

## 2011-12-04 DIAGNOSIS — Z853 Personal history of malignant neoplasm of breast: Secondary | ICD-10-CM | POA: Insufficient documentation

## 2011-12-04 MED ORDER — TRAMADOL-ACETAMINOPHEN 37.5-325 MG PO TABS
1.0000 | ORAL_TABLET | Freq: Three times a day (TID) | ORAL | Status: AC | PRN
Start: 2011-12-04 — End: 2011-12-14

## 2011-12-04 MED ORDER — DICLOFENAC SODIUM 1 % TD GEL: 1.0000 "application " | Freq: Four times a day (QID) | TRANSDERMAL | Status: DC

## 2011-12-04 NOTE — Progress Notes (Signed)
Addended by: Doreene Eland on: 12/04/2011 03:54 PM   Modules accepted: Orders

## 2011-12-04 NOTE — Patient Instructions (Signed)
Myofascial Pain Syndrome  Myofascial pain syndrome is a pain disorder. This pain may be felt in the muscles. It may come and go. Myofascial pain syndrome always has trigger or tender points in the muscle that will cause pain when pressed.    CAUSES  Myofascial pain may be caused by injuries, especially auto accidents, or by overuse of certain muscles. Typically the pain is long lasting. It is made worse by overuse of the involved muscles, emotional distress, and by cold, damp weather. Myofascial pain syndrome often develops in patients whose response to stress is an increase in muscle tone, and is seen in greater frequency in patients with pre-existing tension headaches.  SYMPTOMS    Myofascial pain syndrome causes a wide variety of symptoms. You may see tight ropy bands of muscle. Problems may also include aching, cramping, burning, numbness, tingling, and other uncomfortable sensations in muscular areas. It most commonly affects the neck, upper back, and shoulder areas. Pain often radiates into the arms and hands.    TREATMENT   Treatment includes resting the affected muscular area and applying ice packs to reduce spasm and pain. Trigger point injection, is a valuable initial therapy. This therapy is an injection of local anesthetic directly into the trigger point. Trigger points are often present at the source of pain. Pain relief following injection confirms the diagnosis of myofascial pain syndrome. Fairly vigorous therapy can be carried out during the pain-free period after each injection. Stretching exercises to loosen up the muscles are also useful. Transcutaneous electrical nerve stimulation (TENS) may provide relief from pain. TENS is the use of electric current produced by a device to stimulate the nerves. Ultrasound therapy applied directly over the affected muscle may also provide pain relief. Anti-inflammatory pain medicine can be helpful. Symptoms will gradually improve over a period of weeks to months with proper treatment.  HOME CARE INSTRUCTIONS  Call your caregiver for follow-up care as recommended.    SEEK MEDICAL CARE IF:    Your pain is severe and not helped with medications.  Document Released: 09/03/2004 Document Revised: 07/16/2011 Document Reviewed: 09/12/2010  [employer] Patient Information 2012 [employer]

## 2011-12-04 NOTE — Progress Notes (Addendum)
Subjective:    Patient ID: Christy Harrell, female    DOB: 15-Feb-1941, 71 y.o.   MRN: [id number]  HPI  One year history of diffuse body pain.  She has neck pain and shoulder pain without history of trauma. She has a history of breast carcinoma and has right upper lamina lymphedema. She has not had any treatment for this. She has an appointment with the lymphedema clinic next week.   Past medical history is significant for breast carcinoma which required radiation treatment but is reported to be cancer free at the current time. , history of type 2 diabetes, history of osteoporosis. Pain Inventory Average Pain 10 Pain Right Now 10 My pain is burning and aching  In the last 24 hours, has pain interfered with the following? General activity 10 Relation with others 5 Enjoyment of life 5 What TIME of day is your pain at its worst? just all day Sleep (in general) Fair  Pain is worse with:  oveerhead reaching Pain improves with: heat/ice Relief from Meds: 6  Mobility walk without assistance ability to climb steps?  yes do you drive?  no Do you have any goals in this area?  yes  Function retired  Neuro/Psych No problems in this area  Prior Studies Any changes since last visit?  no Clinical Data: Pain in arms. Bilateral neck pain.  CERVICAL SPINE - COMPLETE 4+ VIEW  Comparison: None.  Findings: Normal alignment of the cervical spine.  The vertebral body heights are well preserved.  Disc space narrowing and ventral endplate spurring is noted at C5-6  and C6-7.  No fractures or subluxations identified.  There is mild narrowing of the left C5-6 and C6-C7 neuroforamina.  IMPRESSION:  1. No acute findings.  2. Cervical spondylosis and left-sided neural foraminal stenoses.  Original Report Authenticated By: [name], M.D. Clinical Data: Shoulder pain  LEFT SHOULDER - 2+ VIEW  Comparison: None.  Findings: The left humeral head is in normal position. There is  mild  acromial spurring and some irregularity of the insertion of  the rotator cuff which may indicate changes of impingement and  chronic rotator cuff disease. Mild degenerative change is present  at the left [facility] joint. No acute abnormality is noted.  IMPRESSION:  Question of impingement. No acute abnormality. RIGHT SHOULDER - 2+ VIEW  Comparison: None  Findings: Surgical clips overlie the right axillary region. There  are degenerative changes at the right acromioclavicular joint.  Glenohumeral joint degenerative changes are present. There is no  evidence for acute fracture or subluxation.  IMPRESSION:  1. Postoperative changes  2. Degenerative changes.  3. No evidence for acute abnormality.  Physicians involved in your care Any changes since last visit?  no       Review of Systems  All other systems reviewed and are negative.       Objective:   Physical Exam  Constitutional: She is oriented to person, place, and time. She appears well-developed.  Musculoskeletal:       Right shoulder: She exhibits pain.       Left shoulder: She exhibits pain.       Arms: Neurological: She is alert and oriented to person, place, and time.  Psychiatric: She has a normal mood and affect.    cross shoulder adduction maneuver is positive. Tenderness over the a.c. Joint. Negative impingement sign  Upper trapezius tenderness to palpation. There is also tenderness to palpation infraspinatus muscle groups.   back has mild tenderness to palpation  at the lumbosacral junction. Negative straight leg raising test     Assessment & Plan:   1. Myofascial pain syndrome primarily affecting the trapezius and infraspinatus muscle groups. Number   2 cervical spondylosis and no evidence or radiculopathy. I will take an MRI is needed at this time.   3. A.c. Joint arthropathy with positive provocative testing   4. Chronic back pain undergoing MRI we will look at this next visit   5. Arthralgias may be  related to her arimidex I encouraged her to discuss with the with Dr. Rudene Christians do trigger point injections today   schedule for ultrasound-guided a.c. Joint injection   Agree with outpatient physical therapy and lymphedema wrapping of the right upper extremity  Trigger Point Injection  Indication:  Trapezius and  infraspinatus Myofascial pain not relieved by medication management and other conservative care.  Informed consent was obtained after describing risk and benefits of the procedure with the patient, this includes bleeding, bruising, infection and medication side effects.  The patient wishes to proceed and has given written consent.  The patient was placed in a seated position.  The  Upper trapezius as well as infraspinatus area was marked and prepped with Betadine.  It was entered with a 25-gauge 1-1/2 inch needle and 1 mL of 1% lidocaine was injected into each of 4 trigger points, after negative draw back for blood.  The patient tolerated the procedure well.  Post procedure instructions were given.  Urine drug screen inconsistent shows presence of hydrocodone but not oxycodone. Nonnarcotic treatment program only

## 2011-12-07 ENCOUNTER — Ambulatory Visit (HOSPITAL_COMMUNITY)
Admission: RE | Admit: 2011-12-07 | Discharge: 2011-12-07 | Disposition: A | Discharge status: Home / Self Care | Payer: Medicare Other | Source: Ambulatory Visit | Attending: Internal Medicine | Admitting: Internal Medicine

## 2011-12-07 DIAGNOSIS — Z853 Personal history of malignant neoplasm of breast: Secondary | ICD-10-CM | POA: Insufficient documentation

## 2011-12-07 DIAGNOSIS — M4802 Spinal stenosis, cervical region: Secondary | ICD-10-CM

## 2011-12-07 DIAGNOSIS — M47814 Spondylosis without myelopathy or radiculopathy, thoracic region: Secondary | ICD-10-CM | POA: Insufficient documentation

## 2011-12-07 DIAGNOSIS — G319 Degenerative disease of nervous system, unspecified: Secondary | ICD-10-CM | POA: Insufficient documentation

## 2011-12-07 DIAGNOSIS — M4692 Unspecified inflammatory spondylopathy, cervical region: Secondary | ICD-10-CM

## 2011-12-07 DIAGNOSIS — M542 Cervicalgia: Secondary | ICD-10-CM | POA: Insufficient documentation

## 2011-12-07 DIAGNOSIS — M47812 Spondylosis without myelopathy or radiculopathy, cervical region: Secondary | ICD-10-CM | POA: Insufficient documentation

## 2011-12-10 ENCOUNTER — Ambulatory Visit: Payer: Medicare Other | Attending: Oncology

## 2011-12-10 DIAGNOSIS — IMO0001 Reserved for inherently not codable concepts without codable children: Secondary | ICD-10-CM | POA: Insufficient documentation

## 2011-12-10 DIAGNOSIS — I890 Lymphedema, not elsewhere classified: Secondary | ICD-10-CM | POA: Insufficient documentation

## 2011-12-14 ENCOUNTER — Ambulatory Visit: Payer: Medicare Other

## 2011-12-16 ENCOUNTER — Ambulatory Visit: Payer: Medicare Other

## 2011-12-21 ENCOUNTER — Ambulatory Visit: Payer: Medicare Other

## 2011-12-23 ENCOUNTER — Ambulatory Visit: Payer: Medicare Other

## 2011-12-28 ENCOUNTER — Ambulatory Visit: Payer: Medicare Other | Admitting: Physical Therapy

## 2011-12-30 ENCOUNTER — Ambulatory Visit: Payer: Medicare Other | Admitting: Physical Therapy

## 2011-12-30 ENCOUNTER — Telehealth: Payer: Self-pay | Admitting: *Deleted

## 2011-12-30 NOTE — Telephone Encounter (Signed)
Message left by Micheline Maze PT with lymphedema clinic wanting to communicate concern with pt's minimial response to therapy.  Lupita Leash states pt has been coming 2 times a week for pressure wraps for several weeks with minimial results.  Noted negative doppler study prior to onset of therapy.  Inquiry is being made for additional interventions or need to see pt prior to currently scheduled appointment.  Return call for Lupita Leash given as [phone]  This note will be given to MD for review.

## 2011-12-31 ENCOUNTER — Encounter: Payer: Self-pay | Admitting: Physical Medicine & Rehabilitation

## 2011-12-31 ENCOUNTER — Encounter: Payer: Medicare Other | Attending: Physical Medicine & Rehabilitation

## 2011-12-31 ENCOUNTER — Telehealth: Payer: Self-pay | Admitting: *Deleted

## 2011-12-31 ENCOUNTER — Ambulatory Visit (HOSPITAL_BASED_OUTPATIENT_CLINIC_OR_DEPARTMENT_OTHER): Payer: Medicare Other | Admitting: Physical Medicine & Rehabilitation

## 2011-12-31 VITALS — BP 124/98 | HR 84 | Ht 60.0 in | Wt 232.0 lb

## 2011-12-31 DIAGNOSIS — M47812 Spondylosis without myelopathy or radiculopathy, cervical region: Secondary | ICD-10-CM | POA: Insufficient documentation

## 2011-12-31 DIAGNOSIS — M4802 Spinal stenosis, cervical region: Secondary | ICD-10-CM

## 2011-12-31 DIAGNOSIS — E119 Type 2 diabetes mellitus without complications: Secondary | ICD-10-CM | POA: Insufficient documentation

## 2011-12-31 DIAGNOSIS — G8929 Other chronic pain: Secondary | ICD-10-CM | POA: Insufficient documentation

## 2011-12-31 DIAGNOSIS — M2550 Pain in unspecified joint: Secondary | ICD-10-CM | POA: Insufficient documentation

## 2011-12-31 DIAGNOSIS — M810 Age-related osteoporosis without current pathological fracture: Secondary | ICD-10-CM | POA: Insufficient documentation

## 2011-12-31 DIAGNOSIS — Z853 Personal history of malignant neoplasm of breast: Secondary | ICD-10-CM | POA: Insufficient documentation

## 2011-12-31 DIAGNOSIS — M19019 Primary osteoarthritis, unspecified shoulder: Secondary | ICD-10-CM

## 2011-12-31 DIAGNOSIS — M549 Dorsalgia, unspecified: Secondary | ICD-10-CM | POA: Insufficient documentation

## 2011-12-31 DIAGNOSIS — IMO0001 Reserved for inherently not codable concepts without codable children: Secondary | ICD-10-CM | POA: Insufficient documentation

## 2011-12-31 NOTE — Progress Notes (Signed)
Subjective:    Patient ID: Christy Harrell, female    DOB: 12-15-1940, 71 y.o.   MRN: [id number]  HPI She returns today for right shoulder pain. She had good relief with trigger point injection. She still has neck pain and aching pain in both shoulders although not as bad as a was previously. She also had exam positive for a.c. Joint pain and was set up for an injection for today. Unfortunately she's had some redness in her right arm. She does have a history of severe lymphedema as well as diabetes. She's had no fever or chills. We reviewed her MRI of the cervical spine showing degenerative disc disease C4-5 and C5-6 levels. This also may explain her shoulder pain. Pain Inventory Average Pain 6 Pain Right Now 0 My pain is aching  In the last 24 hours, has pain interfered with the following? General activity 7 Relation with others 6 Enjoyment of life 6 What TIME of day is your pain at its worst? day and night Sleep (in general) Fair  Pain is worse with: some activites Pain improves with: rest, medication and injections Relief from Meds: 8  Mobility walk without assistance walk with assistance use a cane use a walker ability to climb steps?  yes do you drive?  no use a wheelchair needs help with transfers  Function retired Do you have any goals in this area?  no  Neuro/Psych No problems in this area  Prior Studies Any changes since last visit?  no  Physicians involved in your care Any changes since last visit?  no   Family History  Problem Relation Age of Onset  . Cancer Neg Hx   . Arthritis Neg Hx   . Heart disease Neg Hx   . Hyperlipidemia Neg Hx   . Hypertension Neg Hx   . Dementia Mother   . Hearing loss Mother    History   Social History  . Marital Status: Single    Spouse Name: N/A    Number of Children: N/A  . Years of Education: N/A   Social History Main Topics  . Smoking status: Former Games developer  . Smokeless tobacco: Never Used  . Alcohol Use:  No  . Drug Use: No  . Sexually Active: Not Currently   Other Topics Concern  . None   Social History Narrative  . None   Past Surgical History  Procedure Date  . Breast surgery   . Cesarean section   . Thyroidectomy    Past Medical History  Diagnosis Date  . Cancer     breast  . Club foot   . Arthritis   . Thyroid disease   . Hyperlipidemia   . Diabetes mellitus   . Osteoporosis    BP 124/98  Pulse 84  Ht 5' (1.524 m)  Wt 232 lb (105.235 kg)  BMI 45.31 kg/m2  SpO2 99%     Review of Systems  All other systems reviewed and are negative.       Objective:   Physical Exam Gen. No acute distress Mood and affect are appropriate Right shoulder has no numbness over the axillary nerve distribution. She has normal strength in bilateral deltoids. Left shoulder has no numbness over the C5 distribution. Ambulation is without evidence of gait imbalance.       Assessment & Plan:  1. Cervical degenerative disc disease as well as cervical spondylosis causing shoulder pain and neck pain.I do not think there is a need for surgical referral. She is  not showing signs of myelopathy 2. Myofascial pain right trapeziusis improved after trigger point injection. We can repeat this if her pain starts coming back again. She is going through physical therapy for lymphedema treatment as well as some range of motion of the right upper extremity

## 2011-12-31 NOTE — Patient Instructions (Signed)
Call for injection R AC joint vs

## 2011-12-31 NOTE — Telephone Encounter (Signed)
Per review of call with MD per lymphedema concerns recommendation is for pt to be seen prior to 6/6 appt.  This RN called pt and discussed above with her - she states concern as well due to lymphedema not improving- note lymphedema is also not worse " just not better ".  Per discussion and pt's need to arrange transportation as well as care of her mother appointment made with AB/PA for 5/29.

## 2012-01-01 ENCOUNTER — Ambulatory Visit: Payer: Medicare Other | Admitting: Physical Therapy

## 2012-01-06 ENCOUNTER — Telehealth: Payer: Self-pay

## 2012-01-06 ENCOUNTER — Encounter: Payer: Medicare Other | Admitting: Physical Therapy

## 2012-01-06 ENCOUNTER — Ambulatory Visit (HOSPITAL_BASED_OUTPATIENT_CLINIC_OR_DEPARTMENT_OTHER): Payer: Medicare Other | Admitting: Physician Assistant

## 2012-01-06 ENCOUNTER — Encounter: Payer: Self-pay | Admitting: Physician Assistant

## 2012-01-06 ENCOUNTER — Other Ambulatory Visit: Payer: Self-pay

## 2012-01-06 VITALS — BP 134/74 | HR 102 | Temp 98.7°F | Ht 60.0 in | Wt 232.0 lb

## 2012-01-06 DIAGNOSIS — I890 Lymphedema, not elsewhere classified: Secondary | ICD-10-CM

## 2012-01-06 DIAGNOSIS — IMO0002 Reserved for concepts with insufficient information to code with codable children: Secondary | ICD-10-CM

## 2012-01-06 DIAGNOSIS — Z79811 Long term (current) use of aromatase inhibitors: Secondary | ICD-10-CM

## 2012-01-06 DIAGNOSIS — C50919 Malignant neoplasm of unspecified site of unspecified female breast: Secondary | ICD-10-CM

## 2012-01-06 DIAGNOSIS — L03113 Cellulitis of right upper limb: Secondary | ICD-10-CM

## 2012-01-06 MED ORDER — CEPHALEXIN 500 MG PO CAPS: 500.0000 mg | ORAL_CAPSULE | Freq: Four times a day (QID) | ORAL | Status: AC

## 2012-01-06 NOTE — Telephone Encounter (Signed)
Called vascular lab scheduling and spoke with Ines Bloomer, to schedule pt for u/s doppler of pt's RUE for swelling and pain.  Pt is unable to stay today for this test.  [facility] for this to be done tomorrow, per Zollie Scale, PA.  Per Ines Bloomer, pt scheduled at 11:00 am at [facility], check in at admitting.  Pt aware of appt.

## 2012-01-06 NOTE — Progress Notes (Signed)
ID: Christy Harrell   DOB: [birth year]  MR#: [id number]  WUJ#:[id number]  HISTORY OF PRESENT ILLNESS: The patient had routine screening mammography 10/16/2010 showing a lobulated mass in the right subareolar region measuring up to 5.5 cm. The Left breast showed some central microcalcifications. Biopsy of the Right breast mass on 10/28/2010 showed an invasive ductal carcinoma, grade 2, estrogen receptor positive (Allred score 8), progesterone receptor positive (Allred score 5), with an equivocal HER-2. The Left breast area of microcalcifications was biopsied at the same time, and was read as suspicious for DCIS.  On 11/17/2010 the patient underwent Right lumpectomy and axillary lymph node dissection for what proved to be a 3 cm invasive ductal carcinoma, grade 2, with some papillary and mucinous features. One of 16 lymph nodes was involved. FISH showed no HER-2 amplification. Left breast biopsy was benign.  The patient had an Oncotype sent, with a score of 27, predicting a risk of distant recurrence after 5 years of tamoxifen in the 18% range. With this intermediate result, the decision was made not to proceed with chemotherapy, since the patient is the sole caregiver to her very elderly mother. Instead the patient proceeded to radiation treatment which was completed 03/06/2011. She started anastrozole at that point.  INTERVAL HISTORY: Christy Harrell returns today for for followup of her right upper extremity lymphedema. She was last seen here in April, at which time she was started on Keflex for mild cellulitis. She's also been seen regularly, 2-3 times weekly, for the last several weeks at the lymphedema clinic. They have been wrapping her arm consistently. She does not yet have her sleeve (although this is in the process of being ordered). She contacted our office last week was concerned about the lymphedema. Although it has not worsened, it has not significantly improved over the last few weeks. She denies any  actual pain although she tells me the arm is very uncomfortable, and obviously it is difficult to use. Over half of our 20 minute appointment today was spent reviewing these concerns and coordinating her care.  Christy Harrell continues on anastrozole which she is tolerating well with no significant hot flashes or increased joint pain.   REVIEW OF SYSTEMS: Otherwise, the patient denies any recent illnesses as it had no fever, chills, or night sweats. She denies any additional skin changes elsewhere. No additional peripheral swelling. No abnormal bleeding. No nausea or change in bowel habits. No chest pain or shortness of breath. No cough phlegm production. No abnormal headaches or dizziness. Other than the discomfort in the right upper extremity, she denies any actual pain. Specifically, she's had no unusual myalgias, arthralgias, or bony pain.  Otherwise a detailed review of systems is noncontributory.   PAST MEDICAL HISTORY: Past Medical History  Diagnosis Date  . Cancer     breast  . Club foot   . Arthritis   . Thyroid disease   . Hyperlipidemia   . Diabetes mellitus   . Osteoporosis     PAST SURGICAL HISTORY: Past Surgical History  Procedure Date  . Breast surgery   . Cesarean section   . Thyroidectomy     FAMILY HISTORY Family History  Problem Relation Age of Onset  . Cancer Neg Hx   . Arthritis Neg Hx   . Heart disease Neg Hx   . Hyperlipidemia Neg Hx   . Hypertension Neg Hx   . Dementia Mother   . Hearing loss Mother     GYNECOLOGIC HISTORY: Menarche age 69, menopause in  her early 27s. The patient is GX P1, first pregnancy to term age 83. She did not take hormone replacement   SOCIAL HISTORY:  The patient grew up in [place], [employer], then moved to [place] where she lived about 40 years. 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 Her mother, [name], 23, lives with the patient.  The patient's daughter [name], 31, is disabled secondary to pulmonary hypertension. She can be reached at [phone]. The patient has 2 grandchildren, [name] who works at a Human resources officer business and is 71 years old, ans [place], 73, completing high school  6   ADVANCED DIRECTIVES:  HEALTH MAINTENANCE: History  Substance Use Topics  . Smoking status: Former Games developer  . Smokeless tobacco: Never Used  . Alcohol Use: No     Colonoscopy:  PAP:  Bone density:  Lipid panel: Under treatment  No Known Allergies  Current Outpatient Prescriptions  Medication Sig Dispense Refill  . anastrozole (ARIMIDEX) 1 MG tablet Take 1 tablet (1 mg total) by mouth daily.  90 tablet  3  . atorvastatin (LIPITOR) 10 MG tablet Take 10 mg by mouth daily.      . Calcium Carbonate-Vitamin D (CALCIUM 600 + D PO) Take 1 tablet by mouth.      . diclofenac sodium (VOLTAREN) 1 % GEL Apply 1 application topically 4 (four) times daily.  3 Tube  1  . furosemide (LASIX) 40 MG tablet Take 40 mg by mouth daily.      Marland Kitchen HYDROcodone-acetaminophen (NORCO) 5-325 MG per tablet Take 1 tablet by mouth every 6 (six) hours as needed.  75 tablet  5  . levothyroxine (SYNTHROID, LEVOTHROID) 112 MCG tablet Take 112 mcg by mouth daily.      . metFORMIN (GLUCOPHAGE) 500 MG tablet Take 500 mg by mouth daily.      . Naproxen Sodium (NAPRELAN) 375 MG TB24 Take 1 tablet (375 mg total) by mouth daily.  30 tablet  11  . Polyethyl Glycol-Propyl Glycol (SYSTANE ULTRA OP) Place 1 drop into both eyes daily.        OBJECTIVE: Filed Vitals:   01/06/12 1403  BP: 134/74  Pulse: 102  Temp: 98.7 F (37.1 C)     Body mass index is 45.31 kg/(m^2).    ECOG FS: 1  Filed Weights   01/06/12 1403  Weight: 232 lb (105.235 kg)    Physical Exam: Nodes:  No cervical, supraclavicular, or axillary lymphadenopathy palpated.  Breast Exam:  Deferred   Extremities:  Significant lymphedema noted in the right upper extremity. Nonpitting pedal edema,  equal bilaterally. No peripheral cyanosis. Skin:  Erythema is noted in the right upper extremity, especially in the hand and wrist. Skin is otherwise benign.   Neuro:  Alert and oriented x3.    LAB RESULTS: Lab Results  Component Value Date   WBC 6.5 11/19/2011   NEUTROABS 3.4 11/19/2011   HGB 12.3 11/19/2011   HCT 36.3 11/19/2011   MCV 87.1 11/19/2011   PLT 273 11/19/2011      Chemistry      Component Value Date/Time   NA 140 11/19/2011 1326   K 4.0 11/19/2011 1326   CL 108 11/19/2011 1326   CO2 23 11/19/2011 1326   BUN 18 11/19/2011 1326   CREATININE 0.77 11/19/2011 1326      Component Value Date/Time   CALCIUM 9.5 11/19/2011 1326   ALKPHOS 62 11/19/2011 1326  AST 17 11/19/2011 1326   ALT 9 11/19/2011 1326   BILITOT 0.4 11/19/2011 1326     Vitamin D  was slightly low at 25 on 11/19/2011.  Lab Results  Component Value Date   LABCA2 24 11/19/2011    STUDIES: Mm Digital Diagnostic Bilat  08/18/2011 *RADIOLOGY REPORT* Clinical Data: The patient underwent right lumpectomy and radiation therapy for breast cancer in 2012. She underwent left surgical excisional biopsy with benign results in 2012. DIGITAL DIAGNOSTIC BILATERAL MAMMOGRAM WITH CAD Comparison: 10/16/2010 from [facility] in [place], [place] Findings: The breast tissue is almost entirely fatty. Right lumpectomy and radiation therapy changes are noted. Left excisional biopsy changes are noted. There is no dominant mass, nonsurgical architectural distortion or calcification to suggest malignancy. Mammographic images were processed with CAD. IMPRESSION: No mammographic evidence of malignancy. Yearly diagnostic mammography is suggested. BI-RADS CATEGORY 2: Benign finding(s). Original Report Authenticated By: Daryl Eastern, M.D.    ASSESSMENT: 71 year old [place] woman   (1)  status post right lumpectomy and axillary lymph node dissection 11/17/2010 for a pT2 pN1, stage IIb invasive ductal carcinoma, grade 2,  estrogen and progesterone receptor positive, HER-2 negative, with an Oncotype DX recurrence score of 27 predicting a risk of distant recurrence of 18% with 5 years of tamoxifen (intermediate score);   (2)  status post radiation completed July of 2012,   (3)   the patient started anastrozole in July 2012, and continues with good tolerance  (4)  Lymphedema in RUE, possibly with early cellulitis  PLAN: I am starting Christy Harrell another round of Keflex, 500 mg by mouth 4 times daily for 10 days for what appears to be a recurrent cellulitis in the right upper extremity. She also spoke with our lymphedema therapist, Dorothyann Gibbs, today, who just happen to be in our clinic today for MDBC.  She will continue being seen regularly, 2-3 times weekly, at the lymphedema clinic to have her arm wrapped. They're also ordering a sleeve to be worn at night which Dorothyann Gibbs feels as a percussion remeasure I am repeating the Doppler of the right upper extremity although the Doppler was negative for DVT in March.  I have asked the patient to keep her appointment here next week for reassessment, but she will call with any changes or problems prior to that scheduled appointment.  Jonni Oelkers    01/06/2012

## 2012-01-06 NOTE — Progress Notes (Signed)
Addended by: Catalina Gravel on: 01/06/2012 04:41 PM   Modules accepted: Orders

## 2012-01-07 ENCOUNTER — Other Ambulatory Visit: Payer: Self-pay | Admitting: Physician Assistant

## 2012-01-07 ENCOUNTER — Ambulatory Visit (HOSPITAL_COMMUNITY)
Admission: RE | Admit: 2012-01-07 | Discharge: 2012-01-07 | Disposition: A | Discharge status: Home / Self Care | Payer: Medicare Other | Source: Ambulatory Visit | Attending: Oncology | Admitting: Oncology

## 2012-01-07 ENCOUNTER — Ambulatory Visit (HOSPITAL_COMMUNITY): Payer: Medicare Other

## 2012-01-07 DIAGNOSIS — M79603 Pain in arm, unspecified: Secondary | ICD-10-CM

## 2012-01-07 DIAGNOSIS — Z853 Personal history of malignant neoplasm of breast: Secondary | ICD-10-CM | POA: Insufficient documentation

## 2012-01-07 DIAGNOSIS — M7989 Other specified soft tissue disorders: Secondary | ICD-10-CM

## 2012-01-07 DIAGNOSIS — M79609 Pain in unspecified limb: Secondary | ICD-10-CM | POA: Insufficient documentation

## 2012-01-07 DIAGNOSIS — Z9010 Acquired absence of unspecified breast and nipple: Secondary | ICD-10-CM | POA: Insufficient documentation

## 2012-01-07 NOTE — Progress Notes (Signed)
*  PRELIMINARY RESULTS* Vascular Ultrasound Right upper extremity venous duplex has been completed.  Preliminary findings: Right= No evidence of DVT or SVT.   No answer for call report. Left message on voicemail for Dr. Darrall Dears nurse.  Farrel Demark RDMS 01/07/2012, 11:32 AM

## 2012-01-08 ENCOUNTER — Encounter: Payer: Medicare Other | Admitting: Physical Therapy

## 2012-01-11 ENCOUNTER — Ambulatory Visit: Payer: Medicare Other | Attending: Oncology | Admitting: Physical Therapy

## 2012-01-11 ENCOUNTER — Telehealth: Payer: Self-pay | Admitting: Internal Medicine

## 2012-01-11 DIAGNOSIS — I890 Lymphedema, not elsewhere classified: Secondary | ICD-10-CM | POA: Insufficient documentation

## 2012-01-11 DIAGNOSIS — IMO0001 Reserved for inherently not codable concepts without codable children: Secondary | ICD-10-CM | POA: Insufficient documentation

## 2012-01-11 MED ORDER — METFORMIN HCL 500 MG PO TABS: 500.0000 mg | ORAL_TABLET | Freq: Every day | ORAL | Status: DC

## 2012-01-11 NOTE — Telephone Encounter (Signed)
Requesting refill on Metformin 500.  She has an appt next Monday June 10.

## 2012-01-13 ENCOUNTER — Ambulatory Visit: Payer: Medicare Other | Admitting: Physical Therapy

## 2012-01-14 ENCOUNTER — Telehealth: Payer: Self-pay | Admitting: Oncology

## 2012-01-14 ENCOUNTER — Other Ambulatory Visit (HOSPITAL_BASED_OUTPATIENT_CLINIC_OR_DEPARTMENT_OTHER): Payer: Medicare Other | Admitting: Lab

## 2012-01-14 ENCOUNTER — Ambulatory Visit (HOSPITAL_BASED_OUTPATIENT_CLINIC_OR_DEPARTMENT_OTHER): Payer: Medicare Other | Admitting: Oncology

## 2012-01-14 VITALS — BP 111/61 | HR 70 | Temp 98.8°F | Ht 60.0 in | Wt 232.7 lb

## 2012-01-14 DIAGNOSIS — C50919 Malignant neoplasm of unspecified site of unspecified female breast: Secondary | ICD-10-CM

## 2012-01-14 DIAGNOSIS — I890 Lymphedema, not elsewhere classified: Secondary | ICD-10-CM

## 2012-01-14 LAB — CBC WITH DIFFERENTIAL/PLATELET
BASO%: 0.8 % (ref 0.0–2.0)
EOS%: 3.7 % (ref 0.0–7.0)
HCT: 36.3 % (ref 34.8–46.6)
LYMPH%: 39.1 % (ref 14.0–49.7)
MCH: 29.9 pg (ref 25.1–34.0)
MCHC: 33.1 g/dL (ref 31.5–36.0)
NEUT%: 47.1 % (ref 38.4–76.8)
RBC: 4.03 10*6/uL (ref 3.70–5.45)
lymph#: 2.3 10*3/uL (ref 0.9–3.3)

## 2012-01-14 NOTE — Telephone Encounter (Signed)
gve the pt her sept,march 2014 appt calendar

## 2012-01-14 NOTE — Progress Notes (Signed)
ID: Christy Harrell  DOB: 13-Feb-1941  MR#: [id number]  CSN#: [id number]  HPI: The patient is a 71 year old woman recently returned to this area from [place], with a history of breast cancer, establishing herself in my practice.  The patient had routine screening mammography 10/16/2010 showing a lobulated mass in the right subareolar region measuring up to 5.5 cm. The Left breast showed some central microcalcifications. Biopsy of the Right breast mass on 10/28/2010 showed an invasive ductal carcinoma, grade 2, estrogen receptor positive (Allred score 8), progesterone receptor positive (Allred score 5), with an equivocal HER-2. The Left breast area of microcalcifications was biopsied at the same time, and was read as suspicious for DCIS.  On 11/17/2010 the patient underwent Right lumpectomy and axillary lymph node dissection for what proved to be a 3 cm invasive ductal carcinoma, grade 2, with some papillary and mucinous features. One of 16 lymph nodes was involved. FISH showed no HER-2 amplification. Left breast biopsy was benign.  The patient had an Oncotype sent, with a score of 27, predicting a risk of distant recurrence after 5 years of tamoxifen in the 18% range. With this intermediate result, the decision was made not to proceed with chemotherapy, since the patient is the sole caregiver to her very elderly mother. Instead the patient proceeded to radiation treatment which was completed 03/06/2011. She started anastrozole at that point.  Interval History:   Cattaleya returns today for followup of her breast cancer. She has been working hard with our rehabilitation folks to get her right upper extremity lymphedema under control. She is doing her exercises and wrapping her arm. She is frustrated because she cannot get the sleeves, which she tells me cost about $2000. She continues to be the primary caregiver for a 73 year old mother at home, but she has arranged for her to go of 2 his senior  citizen program at least 3 days a week and possibly soon 5 days a week, which gives her little bit of relief.  ROS:  She feels hot an irritable of because of the right arm problem. She's having hot flashes as well because of the anastrozole. She has significant problems with neck pain and she is receiving of "pain shots" for that. She describes herself as tired sometimes and sometimes having insomnia problem. Sometimes at her ankles swell. This is usually bilateral. She is struggling to keep her diabetes under control, but she is not exercising regularly. A detailed review of systems was otherwise stable  No Known Allergies  Current Outpatient Prescriptions  Medication Sig Dispense Refill  . anastrozole (ARIMIDEX) 1 MG tablet Take 1 tablet (1 mg total) by mouth daily.  90 tablet  3  . atorvastatin (LIPITOR) 10 MG tablet Take 10 mg by mouth daily.      . Calcium Carbonate-Vitamin D (CALCIUM 600 + D PO) Take 1 tablet by mouth.      . cephALEXin (KEFLEX) 500 MG capsule Take 1 capsule (500 mg total) by mouth 4 (four) times daily.  40 capsule  0  . diclofenac sodium (VOLTAREN) 1 % GEL Apply 1 application topically 4 (four) times daily.  3 Tube  1  . furosemide (LASIX) 40 MG tablet Take 40 mg by mouth daily.      Marland Kitchen HYDROcodone-acetaminophen (NORCO) 5-325 MG per tablet Take 1 tablet by mouth every 6 (six) hours as needed.  75 tablet  5  . levothyroxine (SYNTHROID, LEVOTHROID) 112 MCG tablet Take 112 mcg by mouth daily.      Marland Kitchen  metFORMIN (GLUCOPHAGE) 500 MG tablet Take 1 tablet (500 mg total) by mouth daily.  30 tablet  5  . Naproxen Sodium (NAPRELAN) 375 MG TB24 Take 1 tablet (375 mg total) by mouth daily.  30 tablet  11  . Polyethyl Glycol-Propyl Glycol (SYSTANE ULTRA OP) Place 1 drop into both eyes daily.       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 Status post C-section.  Obesity. Osteopenia. Hypertension. History of depression. History of minimal tobacco abuse   Family history: The patient's father died from unknown causes. The patient's mother is alive at age 86. The patient was a single child. There is no history of breast or ovarian cancer in the family to her knowledge.  GYN Hx: Menarche age 61, menopause in her early 67s. The patient is GX P1, first pregnancy to term age 40. She did not take hormone replacement  Social history: The patient grew up in [place], attended [employer], then moved to [place] where she lived about 40 years. 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 Her mother, [name], 56, lives with the patient. The patient's daughter [name], 20, is disabled secondary to pulmonary hypertension. She can be reached at [phone]. The patient has 2 grandchildren, [name] who works at a Human resources officer business and is 71 years old, in general, 53, completing high school.  Health maintenance: She smoked less than a half a pack per day for less than 15 years so she has a less than 10-pack-year tobacco abuse history. She uses alcohol rarely. Her cholesterol is under treatment. She had a bone density in 2012 and is on ibandronate chronically. She has not had a Pap smear in many years.  Objective: Middle-aged [place] American woman who moves awkwardly because of her right upper extremity lymphedema.  Filed Vitals:   01/14/12 1233  BP: 111/61  Pulse: 70  Temp: 98.8 F (37.1 C)    BMI: Body mass index is 45.45 kg/(m^2).   ECOG FS: 2 Physical Exam:   Sclerae unicteric  Oropharynx clear  No cervical or supraclavicular adenopathy  Lungs clear -- no rales or rhonchi  Heart regular rate and rhythm  Abdomen obese, benign  MSK scoliosis but no focal spinal tenderness  Neuro nonfocal  Breast exam: Right breast status post lumpectomy; no evidence of local recurrence. Left breast no suspicious  masses.  Lab Results:      Chemistry      Component Value Date/Time   NA 140 11/19/2011 1326   K 4.0 11/19/2011 1326   CL 108 11/19/2011 1326   CO2 23 11/19/2011 1326   BUN 18 11/19/2011 1326   CREATININE 0.77 11/19/2011 1326      Component Value Date/Time   CALCIUM 9.5 11/19/2011 1326   ALKPHOS 62 11/19/2011 1326   AST 17 11/19/2011 1326   ALT 9 11/19/2011 1326   BILITOT 0.4 11/19/2011 1326       Lab Results  Component Value Date   WBC 5.9 01/14/2012   HGB 12.0 01/14/2012   HCT 36.3 01/14/2012   MCV 90.2 01/14/2012   PLT 281 01/14/2012   NEUTROABS 2.8 01/14/2012    Studies/Results:  Mm Digital Diagnostic Bilat  08/18/2011  *RADIOLOGY REPORT*  Clinical Data:  The patient underwent right lumpectomy and radiation therapy for breast cancer in 2012.  She underwent left surgical excisional biopsy with benign results in 2012.  DIGITAL DIAGNOSTIC BILATERAL MAMMOGRAM WITH CAD  Comparison:  10/16/2010 from [facility] in [place], [place]  Findings:  The breast tissue is almost entirely fatty. Right lumpectomy and radiation therapy changes are noted. Left excisional biopsy changes are noted.  There is no dominant mass, nonsurgical architectural distortion or calcification to suggest malignancy. Mammographic images were processed with CAD.  IMPRESSION: No mammographic evidence of malignancy.  Yearly diagnostic mammography is suggested.  BI-RADS CATEGORY 2:  Benign finding(s).  Original Report Authenticated By: Daryl Eastern, M.D.    Assessment: 71 y.o. [place] woman status post right lumpectomy and axillary lymph node dissection 11/17/2010 for a pT2 pN1, stage IIb invasive ductal carcinoma, grade 2, estrogen and progesterone receptor positive, HER-2 negative, with an Oncotype DX recurrence score of 27 predicting a risk of distant recurrence of 18% with 5 years of tamoxifen (intermediate score); status post radiation completed July of 2012, at which time the patient started  anastrozole.   Plan: I would love to be able to fix her right upper extremity lymphedema but this is a chronic problem and she is getting in her best with the help of our physical therapy folks. She is seeing Dr. Yetta Barre regularly and also getting treatment for her cervical pain. I think we can start seeing her little bit less intensely, since there is no evidence of breast cancer recurrence. She will see [place] again in September and then again in March. She will have her next mammogram January of 2014. The plan is to continue anastrozole for a total of 5 years. She knows to call for any problems that may develop before the next visit.   Janthony Holleman C 01/14/2012

## 2012-01-18 ENCOUNTER — Ambulatory Visit: Payer: Medicare Other | Admitting: Physical Therapy

## 2012-01-18 ENCOUNTER — Ambulatory Visit: Payer: Medicare Other | Admitting: Internal Medicine

## 2012-01-20 ENCOUNTER — Ambulatory Visit: Payer: Medicare Other | Admitting: Physical Therapy

## 2012-01-21 ENCOUNTER — Ambulatory Visit: Payer: Medicare Other | Admitting: Physical Therapy

## 2012-01-21 ENCOUNTER — Encounter: Payer: Self-pay | Admitting: Oncology

## 2012-01-21 NOTE — Progress Notes (Signed)
Patient did call today and she some question about our financial assistance program,because she need some help with a sleeve for he arm,she said that she is going to try and make it by here on tomorrow 01/22/12 if not she would call me.

## 2012-01-22 ENCOUNTER — Encounter: Payer: Self-pay | Admitting: Oncology

## 2012-01-22 NOTE — Progress Notes (Signed)
PATIENT CAME BY TO GET HELP WITH FILLING OUT EPP APPLICATION, HELPED PATIENT, GAVE APPLICATION TO [facility]. PATIENT WILL BE APPROVED, SHE IS IN NEED OF HAVING A SLEEVE. VERY PLEASANT LADY WITH HER FRIEND.

## 2012-01-22 NOTE — Progress Notes (Signed)
Patient approve for 100% Discount  01/22/12 - 07/23/12.

## 2012-01-23 ENCOUNTER — Encounter: Payer: Self-pay | Admitting: Oncology

## 2012-01-23 NOTE — Progress Notes (Signed)
Patient came in needing help with a sleeve and glove,i told her to go to [facility] and they would help her.I did talk with grace over there and she said that the patient would need a prescription for her sleeve.I did get in touch with Dr.Magrinat nurse on Friday Morrie Sheldon and she said that she would get the prescription and take care of fax it over to [facility].I told Morrie Sheldon that the patient was part of our indigent program.

## 2012-01-25 ENCOUNTER — Ambulatory Visit: Payer: Medicare Other | Admitting: Physical Therapy

## 2012-01-26 ENCOUNTER — Encounter: Payer: Self-pay | Admitting: Oncology

## 2012-01-26 NOTE — Progress Notes (Signed)
Patient called to ensure that she did get approved for the 100% discount and that she did not have to pay anything for her sleeves. I told her that she was correct and not to worry about anything, she felt better and was pleased. She also stated that she saw Sharl Ma and that the swelling in her arm is going down and the redness is also leaving she was happy about that.

## 2012-01-27 ENCOUNTER — Encounter: Payer: Medicare Other | Admitting: Physical Therapy

## 2012-02-03 ENCOUNTER — Encounter: Payer: Self-pay | Admitting: Internal Medicine

## 2012-02-03 ENCOUNTER — Ambulatory Visit (INDEPENDENT_AMBULATORY_CARE_PROVIDER_SITE_OTHER): Payer: Medicare Other | Admitting: Internal Medicine

## 2012-02-03 ENCOUNTER — Other Ambulatory Visit (INDEPENDENT_AMBULATORY_CARE_PROVIDER_SITE_OTHER): Payer: Medicare Other

## 2012-02-03 VITALS — BP 130/60 | HR 78 | Temp 98.2°F | Resp 16 | Wt 233.0 lb

## 2012-02-03 DIAGNOSIS — E7800 Pure hypercholesterolemia, unspecified: Secondary | ICD-10-CM

## 2012-02-03 DIAGNOSIS — B356 Tinea cruris: Secondary | ICD-10-CM

## 2012-02-03 DIAGNOSIS — E039 Hypothyroidism, unspecified: Secondary | ICD-10-CM

## 2012-02-03 DIAGNOSIS — IMO0001 Reserved for inherently not codable concepts without codable children: Secondary | ICD-10-CM

## 2012-02-03 LAB — URINALYSIS, ROUTINE W REFLEX MICROSCOPIC
Bilirubin Urine: NEGATIVE
Hgb urine dipstick: NEGATIVE
Ketones, ur: NEGATIVE
Urobilinogen, UA: 0.2 (ref 0.0–1.0)

## 2012-02-03 LAB — MICROALBUMIN / CREATININE URINE RATIO
Creatinine,U: 135.7 mg/dL
Microalb Creat Ratio: 1.3 mg/g (ref 0.0–30.0)

## 2012-02-03 LAB — COMPREHENSIVE METABOLIC PANEL
Alkaline Phosphatase: 61 U/L (ref 39–117)
BUN: 17 mg/dL (ref 6–23)
Glucose, Bld: 90 mg/dL (ref 70–99)
Sodium: 141 mEq/L (ref 135–145)
Total Bilirubin: 0.6 mg/dL (ref 0.3–1.2)

## 2012-02-03 LAB — TSH: TSH: 8.99 u[IU]/mL — ABNORMAL HIGH (ref 0.35–5.50)

## 2012-02-03 LAB — HEMOGLOBIN A1C: Hgb A1c MFr Bld: 6.3 % (ref 4.6–6.5)

## 2012-02-03 MED ORDER — KETOCONAZOLE 2 % EX CREA: TOPICAL_CREAM | Freq: Two times a day (BID) | CUTANEOUS | Status: DC

## 2012-02-03 MED ORDER — LEVOTHYROXINE SODIUM 125 MCG PO TABS: 125.0000 ug | ORAL_TABLET | Freq: Every day | ORAL | Status: DC

## 2012-02-03 NOTE — Assessment & Plan Note (Addendum)
I will check her TSH level today  Late note: her TSH is a little high so I have increased the dose of her thyroid med

## 2012-02-03 NOTE — Addendum Note (Signed)
Addended by: Etta Grandchild on: 02/03/2012 03:38 PM   Modules accepted: Orders

## 2012-02-03 NOTE — Patient Instructions (Signed)
Diabetes, Type 2 Diabetes is a long-lasting (chronic) disease. In type 2 diabetes, the pancreas does not make enough insulin (a hormone), and the body does not respond normally to the insulin that is made. This type of diabetes was also previously called adult-onset diabetes. It usually occurs after the age of 66, but it can occur at any age.  CAUSES  Type 2 diabetes happens because the pancreasis not making enough insulin or your body has trouble using the insulin that your pancreas does make properly. SYMPTOMS   Drinking more than usual.   Urinating more than usual.   Blurred vision.   Dry, itchy skin.   Frequent infections.   Feeling more tired than usual (fatigue).  DIAGNOSIS The diagnosis of type 2 diabetes is usually made by one of the following tests:  Fasting blood glucose test. You will not eat for at least 8 hours and then take a blood test.   Random blood glucose test. Your blood glucose (sugar) is checked at any time of the day regardless of when you ate.   Oral glucose tolerance test (OGTT). Your blood glucose is measured after you have not eaten (fasted) and then after you drink a glucose containing beverage.  TREATMENT   Healthy eating.   Exercise.   Medicine, if needed.   Monitoring blood glucose.   Seeing your caregiver regularly.  HOME CARE INSTRUCTIONS   Check your blood glucose at least once a day. More frequent monitoring may be necessary, depending on your medicines and on how well your diabetes is controlled. Your caregiver will advise you.   Take your medicine as directed by your caregiver.   Do not smoke.   Make wise food choices. Ask your caregiver for information. Weight loss can improve your diabetes.   Learn about low blood glucose (hypoglycemia) and how to treat it.   Get your eyes checked regularly.   Have a yearly physical exam. Have your blood pressure checked and your blood and urine tested.   Wear a pendant or bracelet saying  that you have diabetes.   Check your feet every night for cuts, sores, blisters, and redness. Let your caregiver know if you have any problems.  SEEK MEDICAL CARE IF:   You have problems keeping your blood glucose in target range.   You have problems with your medicines.   You have symptoms of an illness that do not improve after 24 hours.   You have a sore or wound that is not healing.   You notice a change in vision or a new problem with your vision.   You have a fever.  MAKE SURE YOU:  Understand these instructions.   Will watch your condition.   Will get help right away if you are not doing well or get worse.  Document Released: 07/27/2005 Document Revised: 07/16/2011 Document Reviewed: 01/12/2011 [facility] Patient Information 2012 [place], [place].Jock Itch Jock itch is a fungal infection of the skin in the groin area. It is sometimes called "ringworm" even though it is not caused by a worm. A fungus is a type of germ that thrives in dark, damp places.  CAUSES  This infection may spread from:  A fungus infection elsewhere on the body (such as athlete's foot).   Sharing towels or clothing.  This infection is more common in:  Hot, humid climates.   People who wear tight-fitting clothing or wet bathing suits for long periods of time.   Athletes.   Overweight people.   People  with diabetes.  SYMPTOMS  Jock itch causes the following symptoms:  Red, pink or brown rash in the groin. Rash may spread to the thighs, anus, and buttocks.   Itching.  DIAGNOSIS  Your caregiver may make the diagnosis by looking at the rash. Sometimes a skin scraping will be sent to test for fungus. Testing can be done either by looking under the microscope or by doing a culture (test to try to grow the fungus). A culture can take up to 2 weeks to come back. TREATMENT  Jock itch may be treated with:  Skin cream or ointment to kill fungus.   Medicine by mouth to kill fungus.   Skin cream  or ointment to calm the itching.   Compresses or medicated powders to dry the infected skin.  HOME CARE INSTRUCTIONS   Be sure to treat the rash completely. Follow your caregiver's instructions. It can take a couple of weeks to treat. If you do not treat the infection long enough, the rash can come back.   Wear loose-fitting clothing.   Men should wear cotton boxer shorts.   Women should wear cotton underwear.   Avoid hot baths.   Dry the groin area well after bathing.  SEEK MEDICAL CARE IF:   Your rash is worse.   Your rash is spreading.   Your rash returns after treatment is finished.   Your rash is not gone in 4 weeks. Fungal infections are slow to respond to treatment. Some redness may remain for several weeks after the fungus is gone.  SEEK IMMEDIATE MEDICAL CARE IF:  The area becomes red, warm, tender, and swollen.   You have a fever.  Document Released: 07/17/2002 Document Revised: 07/16/2011 Document Reviewed: 06/15/2008 [facility] Patient Information 2012 [place], [place].

## 2012-02-03 NOTE — Assessment & Plan Note (Signed)
Treat with ketoconazole cream

## 2012-02-03 NOTE — Assessment & Plan Note (Signed)
I will check her CPK level today to see that she does not have a myopathy

## 2012-02-03 NOTE — Progress Notes (Signed)
Subjective:    Patient ID: Christy Harrell, female    DOB: December 07, 1940, 71 y.o.   MRN: [id number]  Diabetes She presents for her follow-up diabetic visit. She has type 2 diabetes mellitus. Her disease course has been stable. There are no hypoglycemic associated symptoms. Pertinent negatives for hypoglycemia include no pallor. Pertinent negatives for diabetes include no blurred vision, no chest pain, no fatigue, no foot paresthesias, no foot ulcerations, no polydipsia, no polyphagia, no polyuria, no visual change, no weakness and no weight loss. There are no hypoglycemic complications. Symptoms are stable. There are no diabetic complications. Current diabetic treatment includes oral agent (monotherapy). She is compliant with treatment all of the time. Her weight is stable. She is following a generally healthy diet. Meal planning includes avoidance of concentrated sweets. She has not had a previous visit with a dietician. She never participates in exercise. There is no change in her home blood glucose trend. Her breakfast blood glucose range is generally 110-130 mg/dl. Her lunch blood glucose range is generally 110-130 mg/dl. Her dinner blood glucose range is generally 110-130 mg/dl. Her highest blood glucose is 140-180 mg/dl. Her overall blood glucose range is 110-130 mg/dl. An ACE inhibitor/angiotensin II receptor blocker is being taken. She does not see a podiatrist.Eye exam is current.  Rash This is a new problem. The current episode started in the past 7 days. The problem is unchanged. The affected locations include the groin. The rash is characterized by itchiness and scaling. Pertinent negatives include no congestion, cough, diarrhea, facial edema, fatigue, fever, shortness of breath, sore throat or vomiting. Past treatments include antibiotics. The treatment provided no relief.      Review of Systems  Constitutional: Negative for fever, chills, weight loss, diaphoresis, activity change, appetite  change, fatigue and unexpected weight change.  HENT: Negative.  Negative for congestion and sore throat.   Eyes: Negative.  Negative for blurred vision.  Respiratory: Negative for cough, choking, chest tightness, shortness of breath, wheezing and stridor.   Cardiovascular: Negative for chest pain, palpitations and leg swelling.  Gastrointestinal: Negative for nausea, vomiting, abdominal pain, diarrhea, constipation and blood in stool.  Genitourinary: Negative.  Negative for polyuria.  Musculoskeletal: Positive for back pain (chronic, unchanged) and arthralgias (knees, chronic/unchanged). Negative for myalgias, joint swelling and gait problem.  Skin: Positive for rash. Negative for color change, pallor and wound.  Neurological: Negative.  Negative for weakness.  Hematological: Negative for polydipsia, polyphagia and adenopathy. Does not bruise/bleed easily.  Psychiatric/Behavioral: Negative.        Objective:   Physical Exam  Vitals reviewed. Constitutional: She is oriented to person, place, and time. She appears well-developed and well-nourished. No distress.  HENT:  Head: Normocephalic and atraumatic.  Mouth/Throat: Oropharynx is clear and moist. No oropharyngeal exudate.  Eyes: Conjunctivae are normal. Right eye exhibits no discharge. Left eye exhibits no discharge. No scleral icterus.  Neck: Normal range of motion. Neck supple. No JVD present. No tracheal deviation present. No thyromegaly present.  Cardiovascular: Normal rate, regular rhythm, normal heart sounds and intact distal pulses.  Exam reveals no gallop and no friction rub.   No murmur heard. Pulmonary/Chest: Effort normal and breath sounds normal. No stridor. No respiratory distress. She has no wheezes. She has no rales. She exhibits no tenderness.  Abdominal: Soft. Bowel sounds are normal. She exhibits no distension. There is no tenderness. There is no rebound and no guarding.  Musculoskeletal: Normal range of motion. She  exhibits edema (chronic edema in RUE is unchanged).  She exhibits no tenderness.  Lymphadenopathy:    She has no cervical adenopathy.  Neurological: She is oriented to person, place, and time.  Skin: Skin is warm, dry and intact. Rash noted. No abrasion, no bruising, no burn, no ecchymosis, no laceration, no lesion, no petechiae and no purpura noted. Rash is papular. Rash is not macular, not maculopapular, not nodular, not pustular, not vesicular and not urticarial. She is not diaphoretic. No cyanosis or erythema. No pallor. Nails show no clubbing.     Psychiatric: She has a normal mood and affect. Her behavior is normal. Judgment and thought content normal.      Lab Results  Component Value Date   WBC 5.9 01/14/2012   HGB 12.0 01/14/2012   HCT 36.3 01/14/2012   PLT 281 01/14/2012   GLUCOSE 98 11/19/2011   CHOL 255* 09/03/2011   TRIG 121.0 09/03/2011   HDL 67.60 09/03/2011   LDLDIRECT 174.5 09/03/2011   ALT 9 11/19/2011   AST 17 11/19/2011   NA 140 11/19/2011   K 4.0 11/19/2011   CL 108 11/19/2011   CREATININE 0.77 11/19/2011   BUN 18 11/19/2011   CO2 23 11/19/2011   HGBA1C 6.0 09/03/2011      Assessment & Plan:

## 2012-02-03 NOTE — Assessment & Plan Note (Signed)
I will check her a1c and her renal function, will make changes if needed

## 2012-02-04 ENCOUNTER — Encounter: Payer: Self-pay | Admitting: Internal Medicine

## 2012-02-15 ENCOUNTER — Encounter: Payer: Self-pay | Admitting: Physical Medicine & Rehabilitation

## 2012-02-15 ENCOUNTER — Ambulatory Visit (HOSPITAL_BASED_OUTPATIENT_CLINIC_OR_DEPARTMENT_OTHER): Payer: Medicare Other | Admitting: Physical Medicine & Rehabilitation

## 2012-02-15 ENCOUNTER — Encounter: Payer: Medicare Other | Attending: Physical Medicine & Rehabilitation

## 2012-02-15 VITALS — BP 149/81 | HR 79 | Resp 14 | Ht 60.0 in | Wt 231.0 lb

## 2012-02-15 DIAGNOSIS — E8989 Other postprocedural endocrine and metabolic complications and disorders: Secondary | ICD-10-CM

## 2012-02-15 DIAGNOSIS — M19019 Primary osteoarthritis, unspecified shoulder: Secondary | ICD-10-CM | POA: Insufficient documentation

## 2012-02-15 DIAGNOSIS — IMO0001 Reserved for inherently not codable concepts without codable children: Secondary | ICD-10-CM | POA: Insufficient documentation

## 2012-02-15 DIAGNOSIS — M549 Dorsalgia, unspecified: Secondary | ICD-10-CM | POA: Insufficient documentation

## 2012-02-15 DIAGNOSIS — M2550 Pain in unspecified joint: Secondary | ICD-10-CM | POA: Insufficient documentation

## 2012-02-15 DIAGNOSIS — G8929 Other chronic pain: Secondary | ICD-10-CM | POA: Insufficient documentation

## 2012-02-15 DIAGNOSIS — M810 Age-related osteoporosis without current pathological fracture: Secondary | ICD-10-CM | POA: Insufficient documentation

## 2012-02-15 DIAGNOSIS — M47812 Spondylosis without myelopathy or radiculopathy, cervical region: Secondary | ICD-10-CM | POA: Insufficient documentation

## 2012-02-15 DIAGNOSIS — IMO0002 Reserved for concepts with insufficient information to code with codable children: Secondary | ICD-10-CM

## 2012-02-15 DIAGNOSIS — Z853 Personal history of malignant neoplasm of breast: Secondary | ICD-10-CM | POA: Insufficient documentation

## 2012-02-15 DIAGNOSIS — E119 Type 2 diabetes mellitus without complications: Secondary | ICD-10-CM | POA: Insufficient documentation

## 2012-02-15 DIAGNOSIS — I890 Lymphedema, not elsewhere classified: Secondary | ICD-10-CM

## 2012-02-15 NOTE — Patient Instructions (Addendum)
See me back after you get your sleeve.

## 2012-02-15 NOTE — Progress Notes (Signed)
Subjective:    Patient ID: Christy Harrell, female    DOB: [birth year], 71 y.o.   MRN: [id number]  Neck Pain   Shoulder Pain     One year history of diffuse body pain.  She has neck pain and shoulder pain without history of trauma. She has a history of breast carcinoma and has right upper lamina lymphedema. She has not had any treatment for this. She has an appointment with the lymphedema clinic next week.   Past medical history is significant for breast carcinoma which required radiation treatment but is reported to be cancer free at the current time. , history of type 2 diabetes, history of osteoporosis. Pain Inventory Average Pain 10 Pain Right Now 10 My pain is burning and aching  In the last 24 hours, has pain interfered with the following? General activity 10 Relation with others 5 Enjoyment of life 5 What TIME of day is your pain at its worst? just all day Sleep (in general) Fair  Pain is worse with:  oveerhead reaching Pain improves with: heat/ice Relief from Meds: 6  Mobility walk without assistance ability to climb steps?  yes do you drive?  no Do you have any goals in this area?  yes  Function retired  Neuro/Psych No problems in this area  Prior Studies Any changes since last visit?  no Clinical Data: Pain in arms. Bilateral neck pain.  CERVICAL SPINE - COMPLETE 4+ VIEW  Comparison: None.  Findings: Normal alignment of the cervical spine.  The vertebral body heights are well preserved.  Disc space narrowing and ventral endplate spurring is noted at C5-6  and C6-7.  No fractures or subluxations identified.  There is mild narrowing of the left C5-6 and C6-C7 neuroforamina.  IMPRESSION:  1. No acute findings.  2. Cervical spondylosis and left-sided neural foraminal stenoses.  Original Report Authenticated By: [name], M.D. Clinical Data: Shoulder pain  LEFT SHOULDER - 2+ VIEW  Comparison: None.  Findings: The left humeral head is in normal  position. There is  mild acromial spurring and some irregularity of the insertion of  the rotator cuff which may indicate changes of impingement and  chronic rotator cuff disease. Mild degenerative change is present  at the left [facility] joint. No acute abnormality is noted.  IMPRESSION:  Question of impingement. No acute abnormality. RIGHT SHOULDER - 2+ VIEW  Comparison: None  Findings: Surgical clips overlie the right axillary region. There  are degenerative changes at the right acromioclavicular joint.  Glenohumeral joint degenerative changes are present. There is no  evidence for acute fracture or subluxation.  IMPRESSION:  1. Postoperative changes  2. Degenerative changes.  3. No evidence for acute abnormality.  Physicians involved in your care Any changes since last visit?  no       Review of Systems  HENT: Positive for neck pain.   All other systems reviewed and are negative.       Objective:   Physical Exam  Constitutional: She is oriented to person, place, and time. She appears well-developed.  Musculoskeletal:       Right shoulder: She exhibits pain.       Left shoulder: She exhibits pain.       Arms: Neurological: She is alert and oriented to person, place, and time.  Psychiatric: She has a normal mood and affect.    cross shoulder adduction maneuver is positive. Tenderness over the a.c. Joint. Negative impingement sign  Upper trapezius tenderness to palpation. There is  also tenderness to palpation infraspinatus muscle groups.   back has mild tenderness to palpation at the lumbosacral junction. Negative straight leg raising test     Assessment & Plan:   1. Myofascial pain syndrome primarily affecting the trapezius and levator scap muscle groups. Massive lymphedema in the right arm is likely causing the symptoms that are worse on the right side.  2 cervical spondylosis and no evidence or radiculopathy  3. A.c. Joint arthropathy with neg provocative testing       4. Arthralgias may be related to her arimidex I encouraged her to discuss with the with Dr. Rudene Christians do trigger point injections today    Agree with outpatient physical therapy and lymphedema wrapping of the right upper extremity  Trigger Point Injection  Indication:  Trapezius and  Levator scapula Myofascial pain not relieved by medication management and other conservative care.  Informed consent was obtained after describing risk and benefits of the procedure with the patient, this includes bleeding, bruising, infection and medication side effects.  The patient wishes to proceed and has given written consent.  The patient was placed in a seated position.  The  Upper trapezius as well as infraspinatus area was marked and prepped with Betadine.  It was entered with a 25-gauge 1-1/2 inch needle and 1 mL of 1% lidocaine was injected into each of 4 trigger points, after negative draw back for blood.  The patient tolerated the procedure well.  Post procedure instructions were given.  Urine drug screen inconsistent shows presence of hydrocodone but not oxycodone. Nonnarcotic treatment program only

## 2012-02-29 ENCOUNTER — Other Ambulatory Visit: Payer: Self-pay | Admitting: Emergency Medicine

## 2012-02-29 MED ORDER — ANASTROZOLE 1 MG PO TABS: 1.0000 mg | ORAL_TABLET | Freq: Every day | ORAL | Status: AC

## 2012-04-14 ENCOUNTER — Encounter: Payer: Self-pay | Admitting: Physical Medicine & Rehabilitation

## 2012-04-14 ENCOUNTER — Encounter: Payer: Medicare Other | Attending: Physical Medicine & Rehabilitation

## 2012-04-14 ENCOUNTER — Ambulatory Visit (HOSPITAL_BASED_OUTPATIENT_CLINIC_OR_DEPARTMENT_OTHER): Payer: Medicare Other | Admitting: Physical Medicine & Rehabilitation

## 2012-04-14 VITALS — BP 132/67 | HR 85 | Resp 16 | Ht 60.0 in | Wt 227.0 lb

## 2012-04-14 DIAGNOSIS — IMO0001 Reserved for inherently not codable concepts without codable children: Secondary | ICD-10-CM | POA: Insufficient documentation

## 2012-04-14 DIAGNOSIS — M25511 Pain in right shoulder: Secondary | ICD-10-CM

## 2012-04-14 DIAGNOSIS — M19019 Primary osteoarthritis, unspecified shoulder: Secondary | ICD-10-CM | POA: Insufficient documentation

## 2012-04-14 DIAGNOSIS — M25519 Pain in unspecified shoulder: Secondary | ICD-10-CM

## 2012-04-14 DIAGNOSIS — M810 Age-related osteoporosis without current pathological fracture: Secondary | ICD-10-CM | POA: Insufficient documentation

## 2012-04-14 DIAGNOSIS — E119 Type 2 diabetes mellitus without complications: Secondary | ICD-10-CM | POA: Insufficient documentation

## 2012-04-14 DIAGNOSIS — Z853 Personal history of malignant neoplasm of breast: Secondary | ICD-10-CM | POA: Insufficient documentation

## 2012-04-14 DIAGNOSIS — M549 Dorsalgia, unspecified: Secondary | ICD-10-CM | POA: Insufficient documentation

## 2012-04-14 DIAGNOSIS — G8929 Other chronic pain: Secondary | ICD-10-CM | POA: Insufficient documentation

## 2012-04-14 DIAGNOSIS — M47812 Spondylosis without myelopathy or radiculopathy, cervical region: Secondary | ICD-10-CM | POA: Insufficient documentation

## 2012-04-14 DIAGNOSIS — M2550 Pain in unspecified joint: Secondary | ICD-10-CM | POA: Insufficient documentation

## 2012-04-14 NOTE — Progress Notes (Signed)
Subjective:    Patient ID: Christy Harrell, female    DOB: [birth year], 71 y.o.   MRN: [id number]  HPI 71 year old female with history of right breast carcinoma with lymph node involvement. She has chronic lymphedema. Since I last saw her she's received a sleeve and some lymphedema treatment. Continues to have shoulder pain however. Some of the shoulder pain goes up toward her neck as well. We reviewed all her imaging studies thus far including x-rays of the shoulder showing some mild degenerative changes a.c. Joint as well as glenohumeral joint bilaterally She does have some C5-C6 stenosis on her cervical MRI but no pain that radiates from the neck to the shoulder with any neck motions. No numbness in the shoulders noted. She needs to receive care from hematology/oncology     Pain Inventory Average Pain 9 Pain Right Now 9 My pain is aching  In the last 24 hours, has pain interfered with the following? General activity 8 Relation with others 8 Enjoyment of life 8 What TIME of day is your pain at its worst? morning Sleep (in general) Fair  Pain is worse with: unsure Pain improves with: injections Relief from Meds: 6  Mobility ability to climb steps?  yes do you drive?  no  Function retired I need assistance with the following:  household duties  Neuro/Psych No problems in this area  Prior Studies Any changes since last visit?  no  Physicians involved in your care Any changes since last visit?  no   Family History  Problem Relation Age of Onset  . Cancer Neg Hx   . Arthritis Neg Hx   . Heart disease Neg Hx   . Hyperlipidemia Neg Hx   . Hypertension Neg Hx   . Dementia Mother   . Hearing loss Mother    History   Social History  . Marital Status: Single    Spouse Name: N/A    Number of Children: N/A  . Years of Education: N/A   Social History Main Topics  . Smoking status: Former Games developer  . Smokeless tobacco: Never Used  . Alcohol Use: No  . Drug Use:  No  . Sexually Active: Not Currently   Other Topics Concern  . None   Social History Narrative  . None   Past Surgical History  Procedure Date  . Breast surgery   . Cesarean section   . Thyroidectomy    Past Medical History  Diagnosis Date  . Cancer     breast  . Club foot   . Arthritis   . Thyroid disease   . Hyperlipidemia   . Diabetes mellitus   . Osteoporosis    BP 132/67  Pulse 85  Resp 16  Ht 5' (1.524 m)  Wt 227 lb (102.967 kg)  BMI 44.33 kg/m2  SpO2 100%      Review of Systems  Constitutional: Positive for diaphoresis.  HENT: Positive for neck pain and neck stiffness.   Eyes: Negative.   Respiratory: Negative.   Cardiovascular: Negative.   Gastrointestinal: Negative.   Genitourinary: Negative.   Skin: Negative.   Neurological: Negative.   Hematological: Negative.   Psychiatric/Behavioral: Negative.        Objective:   Physical Exam  Constitutional: She is oriented to person, place, and time. She appears well-developed.       Morbid obesity  HENT:  Head: Normocephalic.  Neck: Normal range of motion.       Negative Spurling's maneuver  Musculoskeletal:  Right shoulder: She exhibits decreased range of motion and pain.       Positive impingement sign) left shoulder, positive Hawkins maneuver Positive drop arm sign  Massive lymphedema right arm, forearm, wrist and fingers Mild tenderness to palpation in bilateral upperTrap muscles  Neurological: She is alert and oriented to person, place, and time. She has normal reflexes.  Psychiatric: She has a normal mood and affect.   Normal sensation       Assessment & Plan:  1. Neck and shoulder pain with positive impingement signs. I suspect she may have some rotator cuff involvement. No clinical signs of radiculopathy Will check shoulder ultrasound today should help US guide further treatment

## 2012-04-14 NOTE — Progress Notes (Signed)
Right shoulder musculoskeletal ultrasound 12 Hz linear transducer. Patient in seated position The biceps groove was scant and long axis and short axis views there was some cortical irregularity at the lesser tuberosity. There is no evidence of subluxation of the right biceps tendon. No significant Peritendinous fluid The thickness of the biceps tendon was mildly reduced. The right supraspinatus tendon showed no partial or full-thickness tears. There is evidence of cortical irregularity On long axis and short axis views.. There was borderline increased subacromial fluid noted on long axis views. The infraspinatus tendon showed evidence of bursal and articular surface tears. There is cortical irregularity that was mild. The supraspinatus tendon long axis views demonstrated calcific tendinitis The right a.c. Joint in long axis views was normal  Impression:  1. Abnormal study  2. Evidence of right infraspinatus partial thickness tears  3. Evidence of right subscapularis calcific tendinitis  4. Evidence of mild subacromial bursitis

## 2012-04-14 NOTE — Patient Instructions (Addendum)
  I ask you to resume your naproxen. He should have refills it is a once a day medication. The other name for it is Naprelan. I'll see you in about 2-3 weeks For a right shoulder injection using the ultrasound to guide me

## 2012-04-18 ENCOUNTER — Encounter: Payer: Self-pay | Admitting: Physician Assistant

## 2012-04-18 ENCOUNTER — Telehealth: Payer: Self-pay | Admitting: *Deleted

## 2012-04-18 ENCOUNTER — Other Ambulatory Visit (HOSPITAL_BASED_OUTPATIENT_CLINIC_OR_DEPARTMENT_OTHER): Payer: Medicare Other | Admitting: Lab

## 2012-04-18 ENCOUNTER — Ambulatory Visit: Payer: Medicare Other | Admitting: Physical Medicine & Rehabilitation

## 2012-04-18 ENCOUNTER — Ambulatory Visit (HOSPITAL_BASED_OUTPATIENT_CLINIC_OR_DEPARTMENT_OTHER): Payer: Medicare Other | Admitting: Physician Assistant

## 2012-04-18 VITALS — BP 124/73 | HR 85 | Temp 98.3°F | Resp 20 | Ht 60.0 in | Wt 231.7 lb

## 2012-04-18 DIAGNOSIS — Z853 Personal history of malignant neoplasm of breast: Secondary | ICD-10-CM

## 2012-04-18 DIAGNOSIS — C50919 Malignant neoplasm of unspecified site of unspecified female breast: Secondary | ICD-10-CM

## 2012-04-18 DIAGNOSIS — Z170 Estrogen receptor positive status [ER+]: Secondary | ICD-10-CM

## 2012-04-18 DIAGNOSIS — I890 Lymphedema, not elsewhere classified: Secondary | ICD-10-CM

## 2012-04-18 LAB — CBC WITH DIFFERENTIAL/PLATELET
Basophils Absolute: 0 10*3/uL (ref 0.0–0.1)
Eosinophils Absolute: 0.2 10*3/uL (ref 0.0–0.5)
HGB: 11.8 g/dL (ref 11.6–15.9)
MCV: 85.7 fL (ref 79.5–101.0)
MONO%: 8.1 % (ref 0.0–14.0)
NEUT#: 2.9 10*3/uL (ref 1.5–6.5)
Platelets: 255 10*3/uL (ref 145–400)
RDW: 13.9 % (ref 11.2–14.5)

## 2012-04-18 NOTE — Telephone Encounter (Signed)
Mammo in Jan 2014  at the breast center on 08-19-2012 at 1:00pm

## 2012-04-18 NOTE — Progress Notes (Signed)
ID: Christy Harrell   DOB: 1941/08/07  MR#: [id number]  CSN#:[id number]  PCP: Christy Linger, MD GYN:  SU:  OTHER MD:  Christy Laws, MD   HISTORY OF PRESENT ILLNESS:  The patient is a 71 year old woman recently returned to this area from [place], with a history of breast cancer, establishing herself in my practice.  The patient had routine screening mammography 10/16/2010 showing a lobulated mass in the right subareolar region measuring up to 5.5 cm. The Left breast showed some central microcalcifications. Biopsy of the Right breast mass on 10/28/2010 showed an invasive ductal carcinoma, grade 2, estrogen receptor positive (Allred score 8), progesterone receptor positive (Allred score 5), with an equivocal HER-2. The Left breast area of microcalcifications was biopsied at the same time, and was read as suspicious for DCIS.  On 11/17/2010 the patient underwent Right lumpectomy and axillary lymph node dissection for what proved to be a 3 cm invasive ductal carcinoma, grade 2, with some papillary and mucinous features. One of 16 lymph nodes was involved. FISH showed no HER-2 amplification. Left breast biopsy was benign.  The patient had an Oncotype sent, with a score of 27, predicting a risk of distant recurrence after 5 years of tamoxifen in the 18% range. With this intermediate result, the decision was made not to proceed with chemotherapy, since the patient is the sole caregiver to her very elderly mother. Instead the patient proceeded to radiation treatment which was completed 03/06/2011. She started anastrozole at that point.   INTERVAL HISTORY: Christy Harrell returns today for routine three-month followup of her right breast carcinoma. She continues on anastrozole which she tolerates well with the exception of continued hot flashes.  The patient's biggest complaint continues to be significant lymphedema in the right extremity, causing chronic pain in the right arm and shoulder. She  has been followed extensively through our lymphedema clinic. She was finally able to obtain a sleeve at no cost which was great. She's also now being seen by Dr. Wynn Harrell for the pain. Understandably, Christy Harrell is very frustrated, and is "tired of hurting".  REVIEW OF SYSTEMS: Otherwise, does linear denies any fevers or chills. She's had no nausea or emesis. She has shortness of breath with exertion but denies any new cough. No chest pain or palpitations. No abnormal headaches or dizziness. She occasionally has some swelling in her feet and ankles. She has some insomnia and her energy level is low. She also continues to have chronic neck pain which is followed by Dr. Yetta Harrell.  A detailed review of systems is otherwise noncontributory.   PAST MEDICAL HISTORY: Past Medical History  Diagnosis Date  . Cancer     breast  . Club foot   . Arthritis   . Thyroid disease   . Hyperlipidemia   . Diabetes mellitus   . Osteoporosis     PAST SURGICAL HISTORY: Past Surgical History  Procedure Date  . Breast surgery   . Cesarean section   . Thyroidectomy     FAMILY HISTORY Family History  Problem Relation Age of Onset  . Cancer Neg Hx   . Arthritis Neg Hx   . Heart disease Neg Hx   . Hyperlipidemia Neg Hx   . Hypertension Neg Hx   . Dementia Mother   . Hearing loss Mother    The patient's father died from unknown causes. The patient's mother is alive at age 90. The patient was a single child. There is no history of breast or ovarian cancer in the  family to her knowledge.  GYNECOLOGIC HISTORY: Menarche age 45, menopause in her early 58s. The patient is GX P1, first pregnancy to term age 10. She did not take hormone replacement  SOCIAL HISTORY: The patient grew up in [place], attended [employer], then moved to [place] where she lived about 40 years. 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 Her mother, Christy Harrell, 13, lives with the patient.  The patient's daughter Christy Harrell, 69, is disabled secondary to pulmonary hypertension. She can be reached at [phone]. The patient has 2 grandchildren, Christy Harrell who works at a Human resources officer business and is 71 years old, in general, 92, completing high school.   ADVANCED DIRECTIVES:  HEALTH MAINTENANCE: History  Substance Use Topics  . Smoking status: Former Games developer  . Smokeless tobacco: Never Used  . Alcohol Use: No     Colonoscopy:  PAP:    Bone density:  2012, on ibandronate chronically  Lipid panel: UTD  No Known Allergies  Current Outpatient Prescriptions  Medication Sig Dispense Refill  . atorvastatin (LIPITOR) 10 MG tablet Take 10 mg by mouth daily.      . Calcium Carbonate-Vitamin D (CALCIUM 600 + D PO) Take 1 tablet by mouth.      . furosemide (LASIX) 40 MG tablet Take 40 mg by mouth daily.      Marland Kitchen HYDROcodone-acetaminophen (NORCO) 5-325 MG per tablet Take 1 tablet by mouth every 6 (six) hours as needed.  75 tablet  5  . ketoconazole (NIZORAL) 2 % cream Apply topically 2 (two) times daily.  60 g  2  . levothyroxine (SYNTHROID, LEVOTHROID) 125 MCG tablet Take 1 tablet (125 mcg total) by mouth daily.  90 tablet  1  . metFORMIN (GLUCOPHAGE) 500 MG tablet Take 1 tablet (500 mg total) by mouth daily.  30 tablet  5  . Naproxen Sodium (NAPRELAN) 375 MG TB24 Take 1 tablet (375 mg total) by mouth daily.  30 tablet  11  . Polyethyl Glycol-Propyl Glycol (SYSTANE ULTRA OP) Place 1 drop into both eyes daily.        OBJECTIVE: Middle-aged [place] American woman who appears uncomfortable but in no acute distress. Filed Vitals:   04/18/12 1119  BP: 124/73  Pulse: 85  Temp: 98.3 F (36.8 C)  Resp: 20     Body mass index is 45.25 kg/(m^2).    ECOG FS: 2 Filed Weights   04/18/12 1119  Weight: 231 lb 11.2 oz (105.098 kg)     Sclerae unicteric  Oropharynx clear  No cervical or supraclavicular adenopathy  Lungs clear -- no rales or rhonchi  Heart regular rate and  rhythm  Abdomen obese, benign, soft, nontender. Positive bowel sounds and  MSK scoliosis but no focal spinal tenderness  Neuro nonfocal  Breast exam: Right breast status post lumpectomy; no evidence of local recurrence. Left breast no suspicious masses.  Lymphedema noted in the right upper extremity with compression sleeve and intact. No additional peripheral edema noted.  Limited ROM in right shoulder secondary to pain.   LAB RESULTS: Lab Results  Component Value Date   WBC 5.7 04/18/2012   NEUTROABS 2.9 04/18/2012   HGB 11.8 04/18/2012   HCT 34.9 04/18/2012   MCV 85.7 04/18/2012   PLT 255 04/18/2012      Chemistry      Component Value Date/Time   NA 141 02/03/2012 1350   K 4.0 02/03/2012 1350   CL 106 02/03/2012 1350   CO2  29 02/03/2012 1350   BUN 17 02/03/2012 1350   CREATININE 0.8 02/03/2012 1350      Component Value Date/Time   CALCIUM 9.4 02/03/2012 1350   ALKPHOS 61 02/03/2012 1350   AST 18 02/03/2012 1350   ALT 13 02/03/2012 1350   BILITOT 0.6 02/03/2012 1350       Lab Results  Component Value Date   LABCA2 24 11/19/2011    STUDIES: No results found.  ASSESSMENT: 81 y.o.  Sisters woman   (1)  status post right lumpectomy and axillary lymph node dissection 11/17/2010 for a pT2 pN1, stage IIb invasive ductal carcinoma, grade 2, estrogen and progesterone receptor positive, HER-2 negative, with an Oncotype DX recurrence score of 27 predicting a risk of distant recurrence of 18% with 5 years of tamoxifen (intermediate score);   (2)  status post radiation completed July of 2012, at which time the patient started anastrozole.  (3) Chronic lymphedema in the right upper extremity.   PLAN: From a breast cancer standpoint, the patient is doing quite well, and we will continue to see her on a six-month basis and she is also due for her next mammogram in January of 2014.  The plan is still to continue anastrozole for total of 5 years.  She knows to call for any problems that may  develop before the next visit.  With regards to the lymphedema and the associated pain, she'll continue to be followed by Dr. Wynn Harrell in addition to Dr. Yetta Harrell.    Toy Eisemann    04/18/2012

## 2012-04-20 ENCOUNTER — Encounter: Payer: Self-pay | Admitting: Physician Assistant

## 2012-04-20 NOTE — Progress Notes (Signed)
I spoke with Christy Harrell today from the lymphedema clinic. She has been following up with an organization that she is hopeful will eventually be able to provide [name] with a night time sleeve for her right lymphedema.  [employer] insurance will not cover this type of garment.)  They do have her measurements, and will hopefully have a sleeve available soon.  Zollie Scale, PA-C 04/20/2012

## 2012-04-21 ENCOUNTER — Ambulatory Visit (INDEPENDENT_AMBULATORY_CARE_PROVIDER_SITE_OTHER)
Admission: RE | Admit: 2012-04-21 | Discharge: 2012-04-21 | Disposition: A | Discharge status: Home / Self Care | Payer: Medicare Other | Source: Ambulatory Visit | Attending: Internal Medicine | Admitting: Internal Medicine

## 2012-04-21 ENCOUNTER — Encounter: Payer: Self-pay | Admitting: Internal Medicine

## 2012-04-21 ENCOUNTER — Other Ambulatory Visit (INDEPENDENT_AMBULATORY_CARE_PROVIDER_SITE_OTHER): Payer: Medicare Other

## 2012-04-21 ENCOUNTER — Ambulatory Visit (INDEPENDENT_AMBULATORY_CARE_PROVIDER_SITE_OTHER): Payer: Medicare Other | Admitting: Internal Medicine

## 2012-04-21 VITALS — BP 120/64 | HR 78 | Temp 98.2°F | Resp 16 | Wt 231.5 lb

## 2012-04-21 DIAGNOSIS — R10814 Left lower quadrant abdominal tenderness: Secondary | ICD-10-CM

## 2012-04-21 DIAGNOSIS — E039 Hypothyroidism, unspecified: Secondary | ICD-10-CM

## 2012-04-21 DIAGNOSIS — M159 Polyosteoarthritis, unspecified: Secondary | ICD-10-CM

## 2012-04-21 LAB — CBC WITH DIFFERENTIAL/PLATELET
Basophils Relative: 0.6 % (ref 0.0–3.0)
Eosinophils Absolute: 0.2 10*3/uL (ref 0.0–0.7)
Eosinophils Relative: 2.6 % (ref 0.0–5.0)
HCT: 36.8 % (ref 36.0–46.0)
Hemoglobin: 12 g/dL (ref 12.0–15.0)
MCHC: 32.7 g/dL (ref 30.0–36.0)
MCV: 88.6 fl (ref 78.0–100.0)
Monocytes Absolute: 0.7 10*3/uL (ref 0.1–1.0)
Neutro Abs: 4 10*3/uL (ref 1.4–7.7)
RBC: 4.15 Mil/uL (ref 3.87–5.11)

## 2012-04-21 LAB — COMPREHENSIVE METABOLIC PANEL
AST: 19 U/L (ref 0–37)
Alkaline Phosphatase: 66 U/L (ref 39–117)
BUN: 22 mg/dL (ref 6–23)
Creatinine, Ser: 0.8 mg/dL (ref 0.4–1.2)
Potassium: 4.1 mEq/L (ref 3.5–5.1)
Total Bilirubin: 0.4 mg/dL (ref 0.3–1.2)

## 2012-04-21 LAB — AMYLASE: Amylase: 67 U/L (ref 27–131)

## 2012-04-21 LAB — URINALYSIS, ROUTINE W REFLEX MICROSCOPIC
Leukocytes, UA: NEGATIVE
Nitrite: NEGATIVE
Specific Gravity, Urine: 1.02 (ref 1.000–1.030)
Urine Glucose: NEGATIVE
Urobilinogen, UA: 1 (ref 0.0–1.0)

## 2012-04-21 MED ORDER — HYDROCODONE-ACETAMINOPHEN 5-325 MG PO TABS: 1.0000 | ORAL_TABLET | Freq: Four times a day (QID) | ORAL | Status: DC | PRN

## 2012-04-21 NOTE — Assessment & Plan Note (Signed)
I will check her labs to look for renal damage, stones, infection, pancreatitis, etc also a plain film to look for SBO, ileus, free air

## 2012-04-21 NOTE — Assessment & Plan Note (Signed)
I will check her TSH and will adjust the dose if needed 

## 2012-04-21 NOTE — Progress Notes (Signed)
Subjective:    Patient ID: Christy Harrell, female    DOB: Mar 18, 1941, 71 y.o.   MRN: [id number]  Abdominal Pain This is a recurrent problem. The current episode started 1 to 4 weeks ago. The onset quality is gradual. The problem occurs intermittently. The most recent episode lasted 3 weeks. The problem has been unchanged. The pain is located in the LLQ. The pain is at a severity of 1/10. The pain is mild. The quality of the pain is aching. The abdominal pain does not radiate. Associated symptoms include arthralgias. Pertinent negatives include no anorexia, belching, constipation, diarrhea, dysuria, fever, flatus, frequency, headaches, hematochezia, hematuria, melena, myalgias, nausea, vomiting or weight loss. Nothing aggravates the pain. The pain is relieved by nothing. She has tried oral narcotic analgesics for the symptoms. The treatment provided moderate relief.      Review of Systems  Constitutional: Negative for fever, chills, weight loss, diaphoresis, activity change, appetite change, fatigue and unexpected weight change.  HENT: Positive for neck pain (chronic, unchanged).   Eyes: Negative.   Respiratory: Negative for cough, chest tightness, shortness of breath, wheezing and stridor.   Cardiovascular: Negative for chest pain, palpitations and leg swelling.  Gastrointestinal: Positive for abdominal pain. Negative for nausea, vomiting, diarrhea, constipation, blood in stool, melena, hematochezia, abdominal distention, anal bleeding, rectal pain, anorexia and flatus.  Genitourinary: Negative.  Negative for dysuria, urgency, frequency, hematuria, flank pain, decreased urine volume, vaginal bleeding, vaginal discharge, difficulty urinating and vaginal pain.  Musculoskeletal: Positive for arthralgias. Negative for myalgias, back pain, joint swelling and gait problem.  Skin: Negative for color change, pallor, rash and wound.  Neurological: Negative.  Negative for dizziness, tremors, seizures,  syncope, facial asymmetry, speech difficulty, weakness, light-headedness, numbness and headaches.  Hematological: Negative.  Negative for adenopathy. Does not bruise/bleed easily.  Psychiatric/Behavioral: Negative.        Objective:   Physical Exam  Vitals reviewed. Constitutional: She is oriented to person, place, and time. She appears well-developed and well-nourished.  Non-toxic appearance. She does not have a sickly appearance. She does not appear ill. No distress.  HENT:  Head: Normocephalic and atraumatic.  Mouth/Throat: Oropharynx is clear and moist. No oropharyngeal exudate.  Eyes: Conjunctivae normal are normal. Right eye exhibits no discharge. Left eye exhibits no discharge. No scleral icterus.  Neck: Normal range of motion. Neck supple. No JVD present. No tracheal deviation present. No thyromegaly present.  Cardiovascular: Normal rate, regular rhythm, normal heart sounds and intact distal pulses.  Exam reveals no gallop and no friction rub.   No murmur heard. Pulmonary/Chest: Effort normal and breath sounds normal. No stridor. No respiratory distress. She has no wheezes. She has no rales. She exhibits no tenderness.  Abdominal: Soft. Normal appearance and bowel sounds are normal. She exhibits no distension, no abdominal bruit, no ascites, no pulsatile midline mass and no mass. There is no hepatosplenomegaly. There is no tenderness. There is no rebound, no guarding and no CVA tenderness. No hernia. Hernia confirmed negative in the right inguinal area and confirmed negative in the left inguinal area.  Musculoskeletal: Normal range of motion. She exhibits no edema and no tenderness.  Lymphadenopathy:    She has no cervical adenopathy.  Neurological: She is oriented to person, place, and time.  Skin: Skin is warm and dry. No rash noted. She is not diaphoretic. No erythema. No pallor.  Psychiatric: She has a normal mood and affect. Her behavior is normal. Judgment and thought content  normal.  Lab Results  Component Value Date   WBC 5.7 04/18/2012   HGB 11.8 04/18/2012   HCT 34.9 04/18/2012   PLT 255 04/18/2012   GLUCOSE 90 02/03/2012   CHOL 255* 09/03/2011   TRIG 121.0 09/03/2011   HDL 67.60 09/03/2011   LDLDIRECT 174.5 09/03/2011   ALT 13 02/03/2012   AST 18 02/03/2012   NA 141 02/03/2012   K 4.0 02/03/2012   CL 106 02/03/2012   CREATININE 0.8 02/03/2012   BUN 17 02/03/2012   CO2 29 02/03/2012   TSH 8.99* 02/03/2012   HGBA1C 6.3 02/03/2012   MICROALBUR 1.8 02/03/2012      Assessment & Plan:

## 2012-04-21 NOTE — Patient Instructions (Signed)

## 2012-04-21 NOTE — Assessment & Plan Note (Signed)
She will continue her current meds for pain 

## 2012-04-22 ENCOUNTER — Encounter: Payer: Self-pay | Admitting: Internal Medicine

## 2012-04-22 ENCOUNTER — Other Ambulatory Visit: Payer: Self-pay | Admitting: Internal Medicine

## 2012-04-22 DIAGNOSIS — E039 Hypothyroidism, unspecified: Secondary | ICD-10-CM

## 2012-04-22 MED ORDER — LEVOTHYROXINE SODIUM 112 MCG PO TABS: 112.0000 ug | ORAL_TABLET | Freq: Every day | ORAL | Status: DC

## 2012-04-27 ENCOUNTER — Telehealth: Payer: Self-pay | Admitting: Internal Medicine

## 2012-04-27 NOTE — Telephone Encounter (Signed)
Patient would like to speak with someone to discuss what she can do about her protein levels being low

## 2012-04-28 NOTE — Telephone Encounter (Signed)
Eat more chicken and fish. Thx

## 2012-04-28 NOTE — Telephone Encounter (Signed)
Patient received lab result that show that her thyroid dose is too high and her protein level is too low. Any suggestions as to what she may do to increase her protein levels?

## 2012-04-29 ENCOUNTER — Telehealth: Payer: Self-pay | Admitting: Medical Oncology

## 2012-04-29 NOTE — Telephone Encounter (Signed)
error 

## 2012-05-02 NOTE — Telephone Encounter (Signed)
Returned call to pt//no answer or VM x 3. Closing phone note until pt calls back

## 2012-05-05 ENCOUNTER — Ambulatory Visit (HOSPITAL_BASED_OUTPATIENT_CLINIC_OR_DEPARTMENT_OTHER): Payer: Medicare Other | Admitting: Physical Medicine & Rehabilitation

## 2012-05-05 ENCOUNTER — Encounter: Payer: Self-pay | Admitting: Physical Medicine & Rehabilitation

## 2012-05-05 VITALS — BP 127/60 | HR 74 | Resp 14 | Ht 60.0 in | Wt 232.0 lb

## 2012-05-05 DIAGNOSIS — IMO0001 Reserved for inherently not codable concepts without codable children: Secondary | ICD-10-CM

## 2012-05-05 NOTE — Progress Notes (Signed)
Subjective:    Patient ID: Christy Harrell, female    DOB: 29-May-1941, 71 y.o.   MRN: [id number]  HPI Here for shoulder injection Reviewed results of the ultrasound of the right shoulder. Has evidence of partial infraspinatus tear, calcific tendinitis of the subscapularis and mild subacromial bursitis Thus far her best result came from a trigger point injection in the right trapezius and right infraspinatus muscle  Pain Inventory Average Pain 9 Pain Right Now 9 My pain is aching  In the last 24 hours, has pain interfered with the following? General activity 0 Relation with others 0 Enjoyment of life 0 What TIME of day is your pain at its worst? varies Sleep (in general) Fair  Pain is worse with: some activites Pain improves with: rest and injections Relief from Meds: 6  Mobility ability to climb steps?  yes do you drive?  no  Function retired  Neuro/Psych No problems in this area  Prior Studies Any changes since last visit?  no  Physicians involved in your care Any changes since last visit?  no   Family History  Problem Relation Age of Onset  . Cancer Neg Hx   . Arthritis Neg Hx   . Heart disease Neg Hx   . Hyperlipidemia Neg Hx   . Hypertension Neg Hx   . Dementia Mother   . Hearing loss Mother    History   Social History  . Marital Status: Single    Spouse Name: N/A    Number of Children: N/A  . Years of Education: N/A   Social History Main Topics  . Smoking status: Former Games developer  . Smokeless tobacco: Never Used  . Alcohol Use: No  . Drug Use: No  . Sexually Active: Not Currently   Other Topics Concern  . None   Social History Narrative  . None   Past Surgical History  Procedure Date  . Breast surgery   . Cesarean section   . Thyroidectomy    Past Medical History  Diagnosis Date  . Cancer     breast  . Club foot   . Arthritis   . Thyroid disease   . Hyperlipidemia   . Diabetes mellitus   . Osteoporosis    BP 127/60  Pulse  74  Resp 14  Ht 5' (1.524 m)  Wt 232 lb (105.235 kg)  BMI 45.31 kg/m2  SpO2 100%   Review of Systems  Constitutional: Positive for diaphoresis.  Cardiovascular:       Arm swelling  Musculoskeletal:       Shoulder pain  All other systems reviewed and are negative.       Objective:   Physical Exam  Pain to palpation right trapezius and right infraspinatus. Upper extremity has severe lymphedema Negative impingement sign      Assessment & Plan:  #1. Myofascial pain syndrome I think this is the primary pain generator although she does have degenerative changes in the right rotator cuff  Trigger point injection ndication:  Trapezius and  infraspinatus Myofascial pain not relieved by medication management and other conservative care.  Informed consent was obtained after describing risk and benefits of the procedure with the patient, this includes bleeding, bruising, infection and medication side effects.  The patient wishes to proceed and has given written consent.  The patient was placed in a seated position.  The  Upper trapezius as well as infraspinatus area was marked and prepped with Betadine.  It was entered with a 25-gauge 1-1/2 inch  needle and 1 mL of 1% lidocaine was injected into each of 4 trigger points, after negative draw back for blood.  The patient tolerated the procedure well.  Post procedure instructions were given.

## 2012-05-05 NOTE — Patient Instructions (Signed)
We did a trigger point injection today every her trapezius and infraspinatus You'll see my assistant Clydie Braun and she will go over some exercises

## 2012-05-31 ENCOUNTER — Encounter: Payer: Self-pay | Admitting: Physical Medicine and Rehabilitation

## 2012-05-31 ENCOUNTER — Encounter
Payer: Medicare Other | Attending: Physical Medicine and Rehabilitation | Admitting: Physical Medicine and Rehabilitation

## 2012-05-31 VITALS — BP 123/66 | HR 97 | Resp 14 | Ht 60.0 in | Wt 231.0 lb

## 2012-05-31 DIAGNOSIS — M129 Arthropathy, unspecified: Secondary | ICD-10-CM | POA: Insufficient documentation

## 2012-05-31 DIAGNOSIS — M25519 Pain in unspecified shoulder: Secondary | ICD-10-CM | POA: Insufficient documentation

## 2012-05-31 DIAGNOSIS — E119 Type 2 diabetes mellitus without complications: Secondary | ICD-10-CM | POA: Insufficient documentation

## 2012-05-31 DIAGNOSIS — M19019 Primary osteoarthritis, unspecified shoulder: Secondary | ICD-10-CM

## 2012-05-31 DIAGNOSIS — M25511 Pain in right shoulder: Secondary | ICD-10-CM

## 2012-05-31 DIAGNOSIS — R52 Pain, unspecified: Secondary | ICD-10-CM | POA: Insufficient documentation

## 2012-05-31 DIAGNOSIS — I890 Lymphedema, not elsewhere classified: Secondary | ICD-10-CM | POA: Insufficient documentation

## 2012-05-31 DIAGNOSIS — M67919 Unspecified disorder of synovium and tendon, unspecified shoulder: Secondary | ICD-10-CM

## 2012-05-31 DIAGNOSIS — M47812 Spondylosis without myelopathy or radiculopathy, cervical region: Secondary | ICD-10-CM | POA: Insufficient documentation

## 2012-05-31 DIAGNOSIS — IMO0001 Reserved for inherently not codable concepts without codable children: Secondary | ICD-10-CM | POA: Insufficient documentation

## 2012-05-31 DIAGNOSIS — M542 Cervicalgia: Secondary | ICD-10-CM | POA: Insufficient documentation

## 2012-05-31 DIAGNOSIS — M810 Age-related osteoporosis without current pathological fracture: Secondary | ICD-10-CM | POA: Insufficient documentation

## 2012-05-31 DIAGNOSIS — Z853 Personal history of malignant neoplasm of breast: Secondary | ICD-10-CM | POA: Insufficient documentation

## 2012-05-31 DIAGNOSIS — M719 Bursopathy, unspecified: Secondary | ICD-10-CM

## 2012-05-31 MED ORDER — DICLOFENAC SODIUM 1 % TD GEL: 2.0000 g | Freq: Four times a day (QID) | TRANSDERMAL | Status: DC

## 2012-05-31 NOTE — Progress Notes (Signed)
Subjective:    Patient ID: Christy Harrell, female    DOB: 1940-08-17, 71 y.o.   MRN: [id number]  HPI One year history of diffuse body pain. She has neck pain and shoulder pain with history of right sided  lympedema , and DJD of right shoulder. She has a history of breast carcinoma and has right upper lamina lymphedema. She has not had any treatment for this. She has an appointment with the lymphedema clinic next week.  Past medical history is significant for breast carcinoma which required radiation treatment but is reported to be cancer free at the current time. , history of type 2 diabetes, history of osteoporosis.  Pain Inventory Average Pain 8 Pain Right Now 8 My pain is aching  In the last 24 hours, has pain interfered with the following? General activity 7 Relation with others 4 Enjoyment of life 7 What TIME of day is your pain at its worst? day and night Sleep (in general) Fair  Pain is worse with: some activites Pain improves with: injections Relief from Meds: 8  Mobility walk with assistance how many minutes can you walk? 8-10 ability to climb steps?  yes do you drive?  no Do you have any goals in this area?  no  Function retired  Neuro/Psych No problems in this area  Prior Studies Any changes since last visit?  no  Physicians involved in your care Any changes since last visit?  no   Family History  Problem Relation Age of Onset  . Cancer Neg Hx   . Arthritis Neg Hx   . Heart disease Neg Hx   . Hyperlipidemia Neg Hx   . Hypertension Neg Hx   . Dementia Mother   . Hearing loss Mother    History   Social History  . Marital Status: Single    Spouse Name: N/A    Number of Children: N/A  . Years of Education: N/A   Social History Main Topics  . Smoking status: Former Games developer  . Smokeless tobacco: Never Used  . Alcohol Use: No  . Drug Use: No  . Sexually Active: Not Currently   Other Topics Concern  . None   Social History Narrative  .  None   Past Surgical History  Procedure Date  . Breast surgery   . Cesarean section   . Thyroidectomy    Past Medical History  Diagnosis Date  . Cancer     breast  . Club foot   . Arthritis   . Thyroid disease   . Hyperlipidemia   . Diabetes mellitus   . Osteoporosis    BP 123/66  Pulse 97  Resp 14  Ht 5' (1.524 m)  Wt 231 lb (104.781 kg)  BMI 45.11 kg/m2  SpO2 96%     Review of Systems  Musculoskeletal: Positive for myalgias and arthralgias.  Hematological: Bruises/bleeds easily.  All other systems reviewed and are negative.       Objective:   Physical Exam  Constitutional: She is oriented to person, place, and time. She appears well-developed and well-nourished.       obese  HENT:  Head: Normocephalic.  Neck: Neck supple.  Musculoskeletal: She exhibits edema and tenderness.  Neurological: She is alert and oriented to person, place, and time.  Skin: Skin is warm and dry.  Psychiatric: She has a normal mood and affect.    Symmetric normal motor tone is noted throughout. Normal muscle bulk. Muscle testing reveals 5/5 muscle strength of the upper  extremity, and 5/5 of the lower extremity. Full range of motion in upper and lower extremities, except right shoulder abduction 90-100 degrees, external rotation in abduction 40 degrees.. ROM of spine is restricted. Fine motor movements are normal in both hands.  DTR in the upper and lower extremity are present and symmetric 2+. No clonus is noted.  Patient arises from chair with mild difficulty. Wide based gait with normal arm swing bilateral.        Assessment & Plan:  1. Myofascial pain syndrome primarily affecting the trapezius and levator scap muscle groups. Massive lymphedema in the right arm is likely causing the symptoms that are worse on the right side.  2 cervical spondylosis and no evidence or radiculopathy  3. A.c. Joint arthropathy with neg provocative testing  4. Right shoulder DJD, rotator cuff  syndrome, ordered PT with pain relief.  Arthralgias may be related to her arimidex I encouraged her to discuss with the with Dr. Rubye Oaks  Follow up in 6 weeks

## 2012-05-31 NOTE — Patient Instructions (Signed)
Try to do some stretching exercises, start with PT

## 2012-06-15 ENCOUNTER — Ambulatory Visit: Payer: Medicare Other | Attending: Physical Medicine and Rehabilitation | Admitting: Physical Therapy

## 2012-06-15 ENCOUNTER — Ambulatory Visit: Payer: Medicare Other | Admitting: Physical Therapy

## 2012-06-15 DIAGNOSIS — I890 Lymphedema, not elsewhere classified: Secondary | ICD-10-CM | POA: Insufficient documentation

## 2012-06-15 DIAGNOSIS — IMO0001 Reserved for inherently not codable concepts without codable children: Secondary | ICD-10-CM | POA: Insufficient documentation

## 2012-06-21 ENCOUNTER — Ambulatory Visit: Payer: Medicare Other | Admitting: Physical Therapy

## 2012-06-27 ENCOUNTER — Ambulatory Visit: Payer: Medicare Other | Admitting: Physical Therapy

## 2012-06-28 ENCOUNTER — Ambulatory Visit: Payer: Medicare Other | Admitting: Physical Therapy

## 2012-06-29 ENCOUNTER — Ambulatory Visit: Payer: Medicare Other

## 2012-07-04 ENCOUNTER — Ambulatory Visit: Payer: Medicare Other | Admitting: Physical Therapy

## 2012-07-11 ENCOUNTER — Encounter
Payer: Medicare Other | Attending: Physical Medicine and Rehabilitation | Admitting: Physical Medicine and Rehabilitation

## 2012-07-11 ENCOUNTER — Encounter: Payer: Self-pay | Admitting: Physical Medicine and Rehabilitation

## 2012-07-11 VITALS — BP 134/43 | HR 96 | Resp 16 | Ht 60.0 in | Wt 228.0 lb

## 2012-07-11 DIAGNOSIS — M719 Bursopathy, unspecified: Secondary | ICD-10-CM | POA: Insufficient documentation

## 2012-07-11 DIAGNOSIS — Z5181 Encounter for therapeutic drug level monitoring: Secondary | ICD-10-CM

## 2012-07-11 DIAGNOSIS — M47812 Spondylosis without myelopathy or radiculopathy, cervical region: Secondary | ICD-10-CM

## 2012-07-11 DIAGNOSIS — M67919 Unspecified disorder of synovium and tendon, unspecified shoulder: Secondary | ICD-10-CM | POA: Insufficient documentation

## 2012-07-11 DIAGNOSIS — IMO0001 Reserved for inherently not codable concepts without codable children: Secondary | ICD-10-CM

## 2012-07-11 DIAGNOSIS — M19019 Primary osteoarthritis, unspecified shoulder: Secondary | ICD-10-CM

## 2012-07-11 NOTE — Patient Instructions (Signed)
Try to stay as active as pain permits, continue with your exercises.

## 2012-07-11 NOTE — Progress Notes (Signed)
Subjective:    Patient ID: Christy Harrell, female    DOB: 01-03-1941, 71 y.o.   MRN: [id number]  HPI One year history of diffuse body pain. She has neck pain and shoulder pain with history of right sided lympedema , and DJD of right shoulder. She has a history of breast carcinoma and has right upper lamina lymphedema. She has not had any treatment for this. She is doing PT and she also has appointments with the lymphedema clinic, which have improved her symptoms substanially. She reports, that she might get a pump or a night time sleeve to treat her severe lymphedema.  Past medical history is significant for breast carcinoma which required radiation treatment but is reported to be cancer free at the current time. , history of type 2 diabetes, history of osteoporosis.  Pain Inventory Average Pain 10 Pain Right Now 8 My pain is aching  In the last 24 hours, has pain interfered with the following? General activity 8 Relation with others 8 Enjoyment of life 8 What TIME of day is your pain at its worst? daytime and night Sleep (in general) Fair  Pain is worse with: unsure Pain improves with: TENS Relief from Meds: 7  Mobility walk without assistance ability to climb steps?  yes do you drive?  no  Function retired Do you have any goals in this area?  no  Neuro/Psych No problems in this area  Prior Studies Any changes since last visit?  no  Physicians involved in your care Any changes since last visit?  no   Family History  Problem Relation Age of Onset  . Cancer Neg Hx   . Arthritis Neg Hx   . Heart disease Neg Hx   . Hyperlipidemia Neg Hx   . Hypertension Neg Hx   . Dementia Mother   . Hearing loss Mother    History   Social History  . Marital Status: Single    Spouse Name: N/A    Number of Children: N/A  . Years of Education: N/A   Social History Main Topics  . Smoking status: Former Games developer  . Smokeless tobacco: Never Used  . Alcohol Use: No  . Drug  Use: No  . Sexually Active: Not Currently   Other Topics Concern  . None   Social History Narrative  . None   Past Surgical History  Procedure Date  . Breast surgery   . Cesarean section   . Thyroidectomy    Past Medical History  Diagnosis Date  . Cancer     breast  . Club foot   . Arthritis   . Thyroid disease   . Hyperlipidemia   . Diabetes mellitus   . Osteoporosis    BP 134/43  Pulse 96  Resp 16  Ht 5' (1.524 m)  Wt 228 lb (103.42 kg)  BMI 44.53 kg/m2  SpO2 97%    Review of Systems  Constitutional: Positive for diaphoresis.  Musculoskeletal: Positive for myalgias and arthralgias.  All other systems reviewed and are negative.       Objective:   Physical Exam Constitutional: She is oriented to person, place, and time. She appears well-developed and well-nourished.  obese  HENT:  Head: Normocephalic.  Neck: Neck supple.  Musculoskeletal: She exhibits edema and tenderness.  Neurological: She is alert and oriented to person, place, and time.  Skin: Skin is warm and dry.  Psychiatric: She has a normal mood and affect.   Symmetric normal motor tone is noted throughout. Normal  muscle bulk. Muscle testing reveals 5/5 muscle strength of the upper extremity, and 5/5 of the lower extremity. Full range of motion in upper and lower extremities, except right shoulder abduction 90-100 degrees, external rotation in abduction 10 degrees restriction, has improved substantially. ROM of spine is restricted. Fine motor movements are normal in the left, restricted in the right because of severe edema.  DTR in the upper and lower extremity are present and symmetric 2+. No clonus is noted.  Patient arises from chair with mild difficulty. Wide based gait with normal arm swing bilateral.  Severe edema in right UE.        Assessment & Plan:  1. Myofascial pain syndrome primarily affecting the trapezius and levator scap muscle groups. Massive lymphedema in the right arm is  likely causing the symptoms that are worse on the right side.  2 cervical spondylosis and no evidence or radiculopathy  3. A.c. Joint arthropathy with neg provocative testing  4. Right shoulder DJD, rotator cuff syndrome, ordered PT with pain relief at last visit, which has helped the patient with her pain, strength and ROM.   Follow up in 6 weeks

## 2012-07-12 ENCOUNTER — Ambulatory Visit: Payer: Medicare Other | Attending: Physical Medicine and Rehabilitation | Admitting: Physical Therapy

## 2012-07-12 DIAGNOSIS — I890 Lymphedema, not elsewhere classified: Secondary | ICD-10-CM | POA: Insufficient documentation

## 2012-07-12 DIAGNOSIS — IMO0001 Reserved for inherently not codable concepts without codable children: Secondary | ICD-10-CM | POA: Insufficient documentation

## 2012-07-14 ENCOUNTER — Ambulatory Visit: Payer: Medicare Other | Admitting: Physical Therapy

## 2012-07-18 ENCOUNTER — Ambulatory Visit: Payer: Medicare Other | Admitting: Physical Therapy

## 2012-07-19 ENCOUNTER — Encounter: Payer: Medicare Other | Admitting: Physical Therapy

## 2012-07-21 ENCOUNTER — Ambulatory Visit: Payer: Medicare Other | Admitting: Physical Therapy

## 2012-07-25 ENCOUNTER — Ambulatory Visit: Payer: Medicare Other | Admitting: Physical Therapy

## 2012-07-27 ENCOUNTER — Ambulatory Visit (INDEPENDENT_AMBULATORY_CARE_PROVIDER_SITE_OTHER): Payer: Medicare Other | Admitting: Internal Medicine

## 2012-07-27 ENCOUNTER — Other Ambulatory Visit (INDEPENDENT_AMBULATORY_CARE_PROVIDER_SITE_OTHER): Payer: Medicare Other

## 2012-07-27 ENCOUNTER — Encounter: Payer: Self-pay | Admitting: Internal Medicine

## 2012-07-27 VITALS — BP 130/70 | HR 93 | Temp 98.7°F | Resp 16 | Wt 228.0 lb

## 2012-07-27 DIAGNOSIS — E039 Hypothyroidism, unspecified: Secondary | ICD-10-CM

## 2012-07-27 DIAGNOSIS — IMO0001 Reserved for inherently not codable concepts without codable children: Secondary | ICD-10-CM

## 2012-07-27 DIAGNOSIS — Z23 Encounter for immunization: Secondary | ICD-10-CM

## 2012-07-27 LAB — COMPREHENSIVE METABOLIC PANEL WITH GFR
ALT: 14 U/L (ref 0–35)
AST: 17 U/L (ref 0–37)
Albumin: 3.5 g/dL (ref 3.5–5.2)
Alkaline Phosphatase: 75 U/L (ref 39–117)
BUN: 20 mg/dL (ref 6–23)
CO2: 30 meq/L (ref 19–32)
Calcium: 9.7 mg/dL (ref 8.4–10.5)
Chloride: 104 meq/L (ref 96–112)
Creatinine, Ser: 0.8 mg/dL (ref 0.4–1.2)
GFR: 88.39 mL/min
Glucose, Bld: 99 mg/dL (ref 70–99)
Potassium: 4.2 meq/L (ref 3.5–5.1)
Sodium: 139 meq/L (ref 135–145)
Total Bilirubin: 0.4 mg/dL (ref 0.3–1.2)
Total Protein: 7.8 g/dL (ref 6.0–8.3)

## 2012-07-27 LAB — CK: Total CK: 202 U/L — ABNORMAL HIGH (ref 7–177)

## 2012-07-27 LAB — TSH: TSH: 1.47 u[IU]/mL (ref 0.35–5.50)

## 2012-07-27 LAB — HEMOGLOBIN A1C: Hgb A1c MFr Bld: 6.5 % (ref 4.6–6.5)

## 2012-07-27 NOTE — Progress Notes (Signed)
Subjective:    Patient ID: Christy Harrell, female    DOB: 1940/08/26, 71 y.o.   MRN: [id number]  Diabetes She presents for her follow-up diabetic visit. She has type 2 diabetes mellitus. Her disease course has been stable. There are no hypoglycemic associated symptoms. Pertinent negatives for hypoglycemia include no dizziness or pallor. Pertinent negatives for diabetes include no blurred vision, no chest pain, no fatigue, no foot paresthesias, no foot ulcerations, no polydipsia, no polyphagia, no polyuria, no visual change, no weakness and no weight loss. There are no hypoglycemic complications. There are no diabetic complications. Current diabetic treatment includes oral agent (monotherapy). She is compliant with treatment all of the time. Her weight is stable. She is following a generally healthy diet. Meal planning includes avoidance of concentrated sweets. She has not had a previous visit with a dietician. She never participates in exercise. There is no change in her home blood glucose trend. An ACE inhibitor/angiotensin II receptor blocker is not being taken. She does not see a podiatrist.Eye exam is not current.      Review of Systems  Constitutional: Negative for fever, chills, weight loss, diaphoresis, activity change, appetite change, fatigue and unexpected weight change.  HENT: Negative.   Eyes: Negative.  Negative for blurred vision.  Respiratory: Negative for cough, chest tightness, shortness of breath, wheezing and stridor.   Cardiovascular: Negative for chest pain, palpitations and leg swelling.  Gastrointestinal: Negative for nausea, vomiting, abdominal pain, diarrhea and constipation.  Genitourinary: Negative.  Negative for polyuria.  Musculoskeletal: Positive for arthralgias. Negative for myalgias, back pain, joint swelling and gait problem.  Skin: Negative for color change, pallor, rash and wound.  Neurological: Negative for dizziness and weakness.  Hematological: Negative  for polydipsia and polyphagia. Does not bruise/bleed easily.  Psychiatric/Behavioral: Negative.        Objective:   Physical Exam  Vitals reviewed. Constitutional: She is oriented to person, place, and time. She appears well-developed and well-nourished. No distress.  HENT:  Head: Normocephalic and atraumatic.  Mouth/Throat: Oropharynx is clear and moist. No oropharyngeal exudate.  Eyes: Conjunctivae normal are normal. Right eye exhibits no discharge. Left eye exhibits no discharge. No scleral icterus.  Neck: Normal range of motion. Neck supple. No JVD present. No tracheal deviation present. No thyromegaly present.  Cardiovascular: Normal rate, regular rhythm, normal heart sounds and intact distal pulses.  Exam reveals no gallop and no friction rub.   No murmur heard. Pulmonary/Chest: Effort normal and breath sounds normal. No stridor. No respiratory distress. She has no wheezes. She has no rales. She exhibits no tenderness.  Abdominal: Soft. Bowel sounds are normal. She exhibits no distension and no mass. There is no tenderness. There is no rebound and no guarding.  Lymphadenopathy:    She has no cervical adenopathy.  Neurological: She is oriented to person, place, and time.  Skin: Skin is warm and dry. No rash noted. She is not diaphoretic. No erythema. No pallor.  Psychiatric: She has a normal mood and affect. Her behavior is normal. Judgment and thought content normal.     Lab Results  Component Value Date   WBC 7.0 04/21/2012   HGB 12.0 04/21/2012   HCT 36.8 04/21/2012   PLT 285.0 04/21/2012   GLUCOSE 93 04/21/2012   CHOL 255* 09/03/2011   TRIG 121.0 09/03/2011   HDL 67.60 09/03/2011   LDLDIRECT 174.5 09/03/2011   ALT 15 04/21/2012   AST 19 04/21/2012   NA 141 04/21/2012   K 4.1 04/21/2012  CL 106 04/21/2012   CREATININE 0.8 04/21/2012   BUN 22 04/21/2012   CO2 27 04/21/2012   TSH 0.29* 04/21/2012   HGBA1C 6.3 02/03/2012   MICROALBUR 1.8 02/03/2012       Assessment & Plan:

## 2012-07-27 NOTE — Patient Instructions (Signed)

## 2012-07-31 ENCOUNTER — Encounter: Payer: Self-pay | Admitting: Internal Medicine

## 2012-07-31 NOTE — Assessment & Plan Note (Signed)
I will check her TSh today and will address if needed

## 2012-07-31 NOTE — Assessment & Plan Note (Signed)
I will check her a1c and will address it if needed I will also monitor her renal function

## 2012-07-31 NOTE — Assessment & Plan Note (Signed)
I will check her CK level today She will continue the same meds for pain

## 2012-08-01 ENCOUNTER — Ambulatory Visit: Payer: Medicare Other | Admitting: Physical Therapy

## 2012-08-02 ENCOUNTER — Encounter: Payer: Medicare Other | Admitting: Physical Therapy

## 2012-08-09 ENCOUNTER — Ambulatory Visit: Payer: Medicare Other | Admitting: Physical Therapy

## 2012-08-18 ENCOUNTER — Telehealth: Payer: Self-pay

## 2012-08-18 DIAGNOSIS — IMO0001 Reserved for inherently not codable concepts without codable children: Secondary | ICD-10-CM

## 2012-08-18 NOTE — Telephone Encounter (Signed)
Patient called request referral to an eye doctor for diabetic testing. She would also like a written script for diabetic shoes. She states that her podiatrist ask that PCP provide this. Thanks

## 2012-08-18 NOTE — Telephone Encounter (Signed)
Ordered mailed out to pt

## 2012-08-18 NOTE — Telephone Encounter (Signed)
done

## 2012-08-19 ENCOUNTER — Ambulatory Visit
Admission: RE | Admit: 2012-08-19 | Discharge: 2012-08-19 | Disposition: A | Discharge status: Home / Self Care | Payer: Medicare Other | Source: Ambulatory Visit | Attending: Physician Assistant | Admitting: Physician Assistant

## 2012-08-19 DIAGNOSIS — Z853 Personal history of malignant neoplasm of breast: Secondary | ICD-10-CM

## 2012-09-09 ENCOUNTER — Telehealth: Payer: Self-pay

## 2012-09-09 NOTE — Telephone Encounter (Signed)
Becky with [facility] is following up on an order for compression device called flexitouch.  Orders given to Clydie Braun to review and sign.

## 2012-09-12 ENCOUNTER — Encounter: Payer: Self-pay | Admitting: Physical Medicine and Rehabilitation

## 2012-09-12 ENCOUNTER — Encounter
Payer: Medicare Other | Attending: Physical Medicine and Rehabilitation | Admitting: Physical Medicine and Rehabilitation

## 2012-09-12 VITALS — BP 94/64 | HR 91 | Resp 14 | Wt 230.0 lb

## 2012-09-12 DIAGNOSIS — R52 Pain, unspecified: Secondary | ICD-10-CM | POA: Insufficient documentation

## 2012-09-12 DIAGNOSIS — M719 Bursopathy, unspecified: Secondary | ICD-10-CM | POA: Insufficient documentation

## 2012-09-12 DIAGNOSIS — IMO0001 Reserved for inherently not codable concepts without codable children: Secondary | ICD-10-CM | POA: Insufficient documentation

## 2012-09-12 DIAGNOSIS — M47812 Spondylosis without myelopathy or radiculopathy, cervical region: Secondary | ICD-10-CM | POA: Insufficient documentation

## 2012-09-12 DIAGNOSIS — M19019 Primary osteoarthritis, unspecified shoulder: Secondary | ICD-10-CM | POA: Insufficient documentation

## 2012-09-12 DIAGNOSIS — M67919 Unspecified disorder of synovium and tendon, unspecified shoulder: Secondary | ICD-10-CM | POA: Insufficient documentation

## 2012-09-12 DIAGNOSIS — E8989 Other postprocedural endocrine and metabolic complications and disorders: Secondary | ICD-10-CM

## 2012-09-12 DIAGNOSIS — I890 Lymphedema, not elsewhere classified: Secondary | ICD-10-CM | POA: Insufficient documentation

## 2012-09-12 NOTE — Progress Notes (Signed)
Subjective:    Patient ID: Christy Harrell, female    DOB: [birth year], 72 y.o.   MRN: [id number]  HPI One year history of diffuse body pain. She has neck pain and shoulder pain with history of right sided lympedema , and DJD of right shoulder. She has a history of breast carcinoma and has right upper lamina lymphedema. She has finished PT and she is through with appointments with the lymphedema clinic, which have improved her symptoms while she was doing them. She states, that her insurance does not pay for more. She reports, that she might get a pump to treat her severe lymphedema. Her arm was already measured for this device, but it has to be approved for. Past medical history is significant for breast carcinoma which required radiation treatment but is reported to be cancer free at the current time. , history of type 2 diabetes, history of osteoporosis.  Pain Inventory Average Pain 10 Pain Right Now 10 My pain is aching  In the last 24 hours, has pain interfered with the following? General activity 9 Relation with others 9 Enjoyment of life 9 What TIME of day is your pain at its worst? all the time Sleep (in general) Fair  Pain is worse with: some activites Pain improves with: medication Relief from Meds: 5  Mobility do you drive?  no  Function retired  Neuro/Psych numbness tingling  Prior Studies Any changes since last visit?  no  Physicians involved in your care Any changes since last visit?  no   Family History  Problem Relation Age of Onset  . Cancer Neg Hx   . Arthritis Neg Hx   . Heart disease Neg Hx   . Hyperlipidemia Neg Hx   . Hypertension Neg Hx   . Dementia Mother   . Hearing loss Mother    History   Social History  . Marital Status: Single    Spouse Name: N/A    Number of Children: N/A  . Years of Education: N/A   Social History Main Topics  . Smoking status: Former Games developer  . Smokeless tobacco: Never Used  . Alcohol Use: No  . Drug Use:  No  . Sexually Active: Not Currently   Other Topics Concern  . None   Social History Narrative  . None   Past Surgical History  Procedure Date  . Breast surgery   . Cesarean section   . Thyroidectomy    Past Medical History  Diagnosis Date  . Cancer     breast  . Club foot   . Arthritis   . Thyroid disease   . Hyperlipidemia   . Diabetes mellitus   . Osteoporosis    BP 94/64  Pulse 91  Resp 14  Wt 230 lb (104.327 kg)  SpO2 98%     Review of Systems  Constitutional: Positive for diaphoresis and unexpected weight change.  Neurological: Positive for weakness and numbness.  All other systems reviewed and are negative.       Objective:   Physical Exam Constitutional: She is oriented to person, place, and time. She appears well-developed and well-nourished.  obese  HENT:  Head: Normocephalic.  Neck: Neck supple.  Musculoskeletal: She exhibits edema and tenderness.  Neurological: She is alert and oriented to person, place, and time.  Skin: Skin is warm and dry.  Psychiatric: She has a normal mood and affect.  Symmetric normal motor tone is noted throughout. Normal muscle bulk. Muscle testing reveals 5/5 muscle strength of the  upper extremity, except right shoulder external rotation 4/5, and 5/5 of the lower extremity. Full range of motion in upper and lower extremities, except right shoulder abduction 90-100 degrees, external rotation in abduction 10 degrees restriction, has improved substantially. ROM of spine is restricted. Fine motor movements are normal in the left, restricted in the right because of severe edema.  DTR in the upper and lower extremity are present and symmetric 2+. No clonus is noted.  Patient arises from chair with mild difficulty. Wide based gait with decreased arm swing .  Severe edema in right UE.        Assessment & Plan:  1. Myofascial pain syndrome primarily affecting the trapezius and levator scap muscle groups. Massive lymphedema  in the right arm is likely causing the symptoms that are worse on the right side.  2 cervical spondylosis and no evidence or radiculopathy  3. A.c. Joint arthropathy with neg provocative testing  4. Right shoulder DJD, rotator cuff syndrome, PT has helped the patient with her pain, strength and ROM, but she stated that her insurance is not paying for more treatments. Patient is considering another US guided injection in the future, if her shoulder pain does not improve. Follow up in 2 month.

## 2012-09-12 NOTE — Patient Instructions (Signed)
Try to elevate your arm as often as possible.

## 2012-09-13 ENCOUNTER — Telehealth: Payer: Self-pay | Admitting: *Deleted

## 2012-09-13 NOTE — Telephone Encounter (Signed)
[name] is calling about the rx for the flexitouch for Christy Harrell.  She is requesting you sign it and date it so it can be faxed back.

## 2012-09-13 NOTE — Telephone Encounter (Signed)
Signed it, will be faxed by front office

## 2012-09-30 ENCOUNTER — Telehealth: Payer: Self-pay | Admitting: *Deleted

## 2012-09-30 NOTE — Telephone Encounter (Signed)
Calling to see if we received the request for signature on letter of medical necessity for Flexitouch.  It was faxed 09/26/12.  Please sign and return to fax # [phone]

## 2012-10-03 NOTE — Telephone Encounter (Signed)
Signed gave Sherron Monday to fax

## 2012-10-05 ENCOUNTER — Encounter: Payer: Self-pay | Admitting: Internal Medicine

## 2012-10-05 ENCOUNTER — Ambulatory Visit (INDEPENDENT_AMBULATORY_CARE_PROVIDER_SITE_OTHER): Payer: Medicare Other | Admitting: Internal Medicine

## 2012-10-05 ENCOUNTER — Other Ambulatory Visit (INDEPENDENT_AMBULATORY_CARE_PROVIDER_SITE_OTHER): Payer: Medicare Other

## 2012-10-05 VITALS — BP 104/62 | HR 72 | Temp 97.4°F | Resp 16 | Wt 228.0 lb

## 2012-10-05 DIAGNOSIS — E039 Hypothyroidism, unspecified: Secondary | ICD-10-CM

## 2012-10-05 DIAGNOSIS — IMO0001 Reserved for inherently not codable concepts without codable children: Secondary | ICD-10-CM

## 2012-10-05 LAB — CBC WITH DIFFERENTIAL/PLATELET
Basophils Relative: 0.6 % (ref 0.0–3.0)
Eosinophils Relative: 2.3 % (ref 0.0–5.0)
Hemoglobin: 12.2 g/dL (ref 12.0–15.0)
Lymphocytes Relative: 37.8 % (ref 12.0–46.0)
MCHC: 33.5 g/dL (ref 30.0–36.0)
Neutro Abs: 3.4 10*3/uL (ref 1.4–7.7)
Neutrophils Relative %: 51.2 % (ref 43.0–77.0)
RBC: 4.14 Mil/uL (ref 3.87–5.11)
WBC: 6.7 10*3/uL (ref 4.5–10.5)

## 2012-10-05 LAB — CK: Total CK: 217 U/L — ABNORMAL HIGH (ref 7–177)

## 2012-10-05 LAB — SEDIMENTATION RATE: Sed Rate: 67 mm/hr — ABNORMAL HIGH (ref 0–22)

## 2012-10-05 MED ORDER — PREGABALIN 75 MG PO CAPS: 75.0000 mg | ORAL_CAPSULE | Freq: Two times a day (BID) | ORAL | Status: DC

## 2012-10-05 MED ORDER — LEVOTHYROXINE SODIUM 125 MCG PO TABS: 125.0000 ug | ORAL_TABLET | Freq: Every day | ORAL | Status: DC

## 2012-10-05 NOTE — Assessment & Plan Note (Signed)
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

## 2012-10-05 NOTE — Patient Instructions (Signed)

## 2012-10-05 NOTE — Addendum Note (Signed)
Addended by: Etta Grandchild on: 10/05/2012 04:32 PM   Modules accepted: Orders, Medications

## 2012-10-05 NOTE — Assessment & Plan Note (Signed)
I will recheck her TSH level today 

## 2012-10-05 NOTE — Progress Notes (Signed)
  Subjective:    Patient ID: Christy Harrell, female    DOB: March 26, 1941, 72 y.o.   MRN: [id number]  Thyroid Problem Presents for follow-up visit. Symptoms include depressed mood and fatigue. Patient reports no anxiety, cold intolerance, constipation, diaphoresis, diarrhea, dry skin, hair loss, heat intolerance, hoarse voice, leg swelling, nail problem, palpitations, tremors, visual change, weight gain or weight loss. The symptoms have been worsening.      Review of Systems  Constitutional: Positive for fatigue. Negative for fever, chills, weight loss, weight gain, diaphoresis, activity change, appetite change and unexpected weight change.  HENT: Negative.  Negative for hoarse voice.   Eyes: Negative.   Respiratory: Negative for cough, chest tightness, shortness of breath, wheezing and stridor.   Cardiovascular: Negative for chest pain, palpitations and leg swelling.  Gastrointestinal: Negative for nausea, vomiting, diarrhea, constipation, blood in stool, abdominal distention and anal bleeding.  Endocrine: Negative.  Negative for cold intolerance and heat intolerance.  Genitourinary: Negative.   Musculoskeletal: Positive for myalgias ("I hurt all over"), back pain and arthralgias. Negative for joint swelling and gait problem.  Skin: Negative.   Allergic/Immunologic: Negative.   Neurological: Negative for dizziness, tremors, seizures, syncope, facial asymmetry, speech difficulty, weakness, light-headedness, numbness and headaches.  Hematological: Negative for adenopathy. Does not bruise/bleed easily.  Psychiatric/Behavioral: Positive for sleep disturbance and dysphoric mood. Negative for suicidal ideas, hallucinations, behavioral problems, confusion, self-injury, decreased concentration and agitation. The patient is nervous/anxious. The patient is not hyperactive.        Objective:   Physical Exam  Vitals reviewed. Constitutional: She is oriented to person, place, and time. She appears  well-developed and well-nourished. No distress.  HENT:  Head: Normocephalic and atraumatic.  Mouth/Throat: Oropharynx is clear and moist. No oropharyngeal exudate.  Eyes: Conjunctivae are normal. Right eye exhibits no discharge. Left eye exhibits no discharge. No scleral icterus.  Neck: Normal range of motion. Neck supple. No JVD present. No tracheal deviation present. No thyromegaly present.  Cardiovascular: Normal rate, regular rhythm, normal heart sounds and intact distal pulses.  Exam reveals no gallop and no friction rub.   No murmur heard. Pulmonary/Chest: Effort normal and breath sounds normal. No stridor. No respiratory distress. She has no wheezes. She has no rales. She exhibits no tenderness.  Abdominal: Soft. Bowel sounds are normal. She exhibits no distension and no mass. There is no tenderness. There is no rebound and no guarding.  Musculoskeletal: Normal range of motion. She exhibits no edema and no tenderness.  Lymphadenopathy:    She has no cervical adenopathy.  Neurological: She is oriented to person, place, and time.  Skin: Skin is warm and dry. No rash noted. She is not diaphoretic. No erythema. No pallor.  Psychiatric: She has a normal mood and affect. Her behavior is normal. Judgment normal.     Lab Results  Component Value Date   WBC 7.0 04/21/2012   HGB 12.0 04/21/2012   HCT 36.8 04/21/2012   PLT 285.0 04/21/2012   GLUCOSE 99 07/27/2012   CHOL 255* 09/03/2011   TRIG 121.0 09/03/2011   HDL 67.60 09/03/2011   LDLDIRECT 174.5 09/03/2011   ALT 14 07/27/2012   AST 17 07/27/2012   NA 139 07/27/2012   K 4.2 07/27/2012   CL 104 07/27/2012   CREATININE 0.8 07/27/2012   BUN 20 07/27/2012   CO2 30 07/27/2012   TSH 1.47 07/27/2012   HGBA1C 6.5 07/27/2012   MICROALBUR 1.8 02/03/2012       Assessment & Plan:

## 2012-10-19 ENCOUNTER — Other Ambulatory Visit: Payer: Self-pay | Admitting: *Deleted

## 2012-10-20 ENCOUNTER — Telehealth: Payer: Self-pay | Admitting: *Deleted

## 2012-10-20 ENCOUNTER — Ambulatory Visit: Payer: Medicare Other | Admitting: Oncology

## 2012-10-20 ENCOUNTER — Other Ambulatory Visit: Payer: Medicare Other | Admitting: Lab

## 2012-10-25 NOTE — Progress Notes (Signed)
No show letter sent.

## 2012-10-31 ENCOUNTER — Telehealth: Payer: Self-pay | Admitting: Oncology

## 2012-11-22 ENCOUNTER — Other Ambulatory Visit: Payer: Self-pay | Admitting: Internal Medicine

## 2012-11-30 ENCOUNTER — Ambulatory Visit: Payer: Medicare Other | Admitting: Internal Medicine

## 2012-12-01 ENCOUNTER — Ambulatory Visit (INDEPENDENT_AMBULATORY_CARE_PROVIDER_SITE_OTHER): Payer: Medicare Other | Admitting: Internal Medicine

## 2012-12-01 ENCOUNTER — Ambulatory Visit (INDEPENDENT_AMBULATORY_CARE_PROVIDER_SITE_OTHER)
Admission: RE | Admit: 2012-12-01 | Discharge: 2012-12-01 | Disposition: A | Discharge status: Home / Self Care | Payer: Medicare Other | Source: Ambulatory Visit | Attending: Internal Medicine | Admitting: Internal Medicine

## 2012-12-01 ENCOUNTER — Encounter: Payer: Self-pay | Admitting: Internal Medicine

## 2012-12-01 ENCOUNTER — Telehealth: Payer: Self-pay | Admitting: Internal Medicine

## 2012-12-01 VITALS — BP 118/72 | HR 84 | Temp 97.9°F | Resp 16 | Wt 226.0 lb

## 2012-12-01 DIAGNOSIS — M25532 Pain in left wrist: Secondary | ICD-10-CM

## 2012-12-01 DIAGNOSIS — M545 Low back pain, unspecified: Secondary | ICD-10-CM | POA: Insufficient documentation

## 2012-12-01 DIAGNOSIS — M899 Disorder of bone, unspecified: Secondary | ICD-10-CM

## 2012-12-01 DIAGNOSIS — M25539 Pain in unspecified wrist: Secondary | ICD-10-CM

## 2012-12-01 DIAGNOSIS — M4692 Unspecified inflammatory spondylopathy, cervical region: Secondary | ICD-10-CM

## 2012-12-01 DIAGNOSIS — M25559 Pain in unspecified hip: Secondary | ICD-10-CM

## 2012-12-01 DIAGNOSIS — M159 Polyosteoarthritis, unspecified: Secondary | ICD-10-CM

## 2012-12-01 DIAGNOSIS — M25552 Pain in left hip: Secondary | ICD-10-CM

## 2012-12-01 DIAGNOSIS — M25519 Pain in unspecified shoulder: Secondary | ICD-10-CM

## 2012-12-01 DIAGNOSIS — M25512 Pain in left shoulder: Secondary | ICD-10-CM

## 2012-12-01 MED ORDER — BUPRENORPHINE 10 MCG/HR TD PTWK: 10.0000 ug | MEDICATED_PATCH | TRANSDERMAL | Status: DC

## 2012-12-01 NOTE — Telephone Encounter (Signed)
The pharmacy called to say the Butrans patch is not covered with the patients insurance.  Is there something else?

## 2012-12-01 NOTE — Progress Notes (Signed)
  Subjective:    Patient ID: Christy Harrell, female    DOB: 1941/06/13, 72 y.o.   MRN: [id number]  Arthritis Presents for follow-up visit. She complains of pain. She reports no stiffness, joint swelling or joint warmth. The symptoms have been worsening. Affected locations include the left shoulder, left wrist and left hip. Her pain is at a severity of 4/10. Associated symptoms include pain at night and pain while resting. Pertinent negatives include no diarrhea, dry eyes, dry mouth, dysuria, fatigue, fever, rash, Raynaud's syndrome, uveitis or weight loss. Compliance with total regimen is 76-100%.      Review of Systems  Constitutional: Negative.  Negative for fever, chills, weight loss, diaphoresis, activity change, appetite change, fatigue and unexpected weight change.  HENT: Negative.   Eyes: Negative.   Respiratory: Negative.  Negative for cough, chest tightness, shortness of breath, wheezing and stridor.   Cardiovascular: Negative.  Negative for chest pain, palpitations and leg swelling.  Gastrointestinal: Negative.  Negative for nausea, abdominal pain, diarrhea and constipation.  Endocrine: Negative.   Genitourinary: Negative.  Negative for dysuria.  Musculoskeletal: Positive for back pain and arthritis. Negative for myalgias, joint swelling, gait problem and stiffness.  Skin: Negative.  Negative for color change, pallor, rash and wound.  Allergic/Immunologic: Negative.   Neurological: Negative.   Hematological: Negative.   Psychiatric/Behavioral: Negative.        Objective:   Physical Exam  Vitals reviewed. Constitutional: She is oriented to person, place, and time. She appears well-developed and well-nourished. No distress.  HENT:  Head: Normocephalic and atraumatic.  Mouth/Throat: Oropharynx is clear and moist. No oropharyngeal exudate.  Eyes: Conjunctivae are normal. Right eye exhibits no discharge. Left eye exhibits no discharge. No scleral icterus.  Neck: Normal range  of motion. Neck supple. No JVD present. No tracheal deviation present. No thyromegaly present.  Cardiovascular: Normal rate, regular rhythm, normal heart sounds and intact distal pulses.  Exam reveals no gallop and no friction rub.   No murmur heard. Pulmonary/Chest: Effort normal and breath sounds normal. No stridor. No respiratory distress. She has no wheezes. She has no rales. She exhibits no tenderness.  Abdominal: Soft. Bowel sounds are normal. She exhibits no distension and no mass. There is no tenderness. There is no rebound and no guarding.  Musculoskeletal: She exhibits no edema and no tenderness.       Left shoulder: She exhibits decreased range of motion. She exhibits no tenderness, no bony tenderness, no swelling, no effusion, no crepitus, no deformity, no laceration, no pain, no spasm, normal pulse and normal strength.       Left hip: Normal. She exhibits normal range of motion, normal strength, no tenderness, no bony tenderness, no swelling, no crepitus, no deformity and no laceration.       Cervical back: Normal.       Thoracic back: Normal.       Lumbar back: Normal. She exhibits normal range of motion, no tenderness, no bony tenderness, no swelling, no edema, no deformity, no laceration, no pain, no spasm and normal pulse.  Lymphadenopathy:    She has no cervical adenopathy.  Neurological: She is oriented to person, place, and time.  Skin: Skin is warm and dry. No rash noted. She is not diaphoretic. No erythema. No pallor.  Psychiatric: She has a normal mood and affect. Her behavior is normal. Judgment and thought content normal.          Assessment & Plan:

## 2012-12-01 NOTE — Assessment & Plan Note (Signed)
Plain films today 

## 2012-12-01 NOTE — Addendum Note (Signed)
Addended by: Etta Grandchild on: 12/01/2012 02:00 PM   Modules accepted: Orders

## 2012-12-01 NOTE — Assessment & Plan Note (Signed)
Films today to check for fracture, lytic lesions, djd

## 2012-12-01 NOTE — Patient Instructions (Signed)

## 2012-12-01 NOTE — Assessment & Plan Note (Signed)
She will continue her current meds and will add butrans for additional pain control as she tells me that her current regimen of meds only controls the pain for a few hours

## 2012-12-02 MED ORDER — FENTANYL 25 MCG/HR TD PT72: 1.0000 | MEDICATED_PATCH | TRANSDERMAL | Status: DC

## 2012-12-02 NOTE — Telephone Encounter (Signed)
Pt is aware to call the pharmacy.

## 2012-12-02 NOTE — Telephone Encounter (Signed)
No entry 

## 2012-12-02 NOTE — Telephone Encounter (Signed)
changed

## 2012-12-05 ENCOUNTER — Telehealth: Payer: Self-pay

## 2012-12-05 NOTE — Telephone Encounter (Signed)
Message left by [facility] regarding Butran patch for pt.

## 2012-12-06 NOTE — Telephone Encounter (Signed)
Returned call to [employer] at [phone], medication was changed to duragesic which is considered formulary

## 2012-12-08 ENCOUNTER — Encounter (HOSPITAL_COMMUNITY): Payer: Self-pay | Admitting: *Deleted

## 2012-12-08 ENCOUNTER — Emergency Department (HOSPITAL_COMMUNITY): Payer: Medicare Other

## 2012-12-08 ENCOUNTER — Emergency Department (HOSPITAL_COMMUNITY)
Admission: EM | Admit: 2012-12-08 | Discharge: 2012-12-08 | Disposition: A | Discharge status: Home / Self Care | Payer: Medicare Other | Attending: Emergency Medicine | Admitting: Emergency Medicine

## 2012-12-08 DIAGNOSIS — Z8776 Personal history of (corrected) congenital malformations of integument, limbs and musculoskeletal system: Secondary | ICD-10-CM | POA: Insufficient documentation

## 2012-12-08 DIAGNOSIS — E079 Disorder of thyroid, unspecified: Secondary | ICD-10-CM | POA: Insufficient documentation

## 2012-12-08 DIAGNOSIS — Z79899 Other long term (current) drug therapy: Secondary | ICD-10-CM | POA: Insufficient documentation

## 2012-12-08 DIAGNOSIS — M129 Arthropathy, unspecified: Secondary | ICD-10-CM | POA: Insufficient documentation

## 2012-12-08 DIAGNOSIS — S42309A Unspecified fracture of shaft of humerus, unspecified arm, initial encounter for closed fracture: Secondary | ICD-10-CM | POA: Insufficient documentation

## 2012-12-08 DIAGNOSIS — M810 Age-related osteoporosis without current pathological fracture: Secondary | ICD-10-CM | POA: Insufficient documentation

## 2012-12-08 DIAGNOSIS — Y9289 Other specified places as the place of occurrence of the external cause: Secondary | ICD-10-CM | POA: Insufficient documentation

## 2012-12-08 DIAGNOSIS — Y9389 Activity, other specified: Secondary | ICD-10-CM | POA: Insufficient documentation

## 2012-12-08 DIAGNOSIS — E785 Hyperlipidemia, unspecified: Secondary | ICD-10-CM | POA: Insufficient documentation

## 2012-12-08 DIAGNOSIS — E119 Type 2 diabetes mellitus without complications: Secondary | ICD-10-CM | POA: Insufficient documentation

## 2012-12-08 DIAGNOSIS — S42302A Unspecified fracture of shaft of humerus, left arm, initial encounter for closed fracture: Secondary | ICD-10-CM

## 2012-12-08 DIAGNOSIS — X58XXXA Exposure to other specified factors, initial encounter: Secondary | ICD-10-CM | POA: Insufficient documentation

## 2012-12-08 DIAGNOSIS — Z87891 Personal history of nicotine dependence: Secondary | ICD-10-CM | POA: Insufficient documentation

## 2012-12-08 DIAGNOSIS — Z87768 Personal history of other specified (corrected) congenital malformations of integument, limbs and musculoskeletal system: Secondary | ICD-10-CM | POA: Insufficient documentation

## 2012-12-08 DIAGNOSIS — Z853 Personal history of malignant neoplasm of breast: Secondary | ICD-10-CM | POA: Insufficient documentation

## 2012-12-08 MED ORDER — OXYCODONE-ACETAMINOPHEN 5-325 MG PO TABS: 2.0000 | ORAL_TABLET | ORAL | Status: DC | PRN

## 2012-12-08 MED ORDER — OXYCODONE-ACETAMINOPHEN 5-325 MG PO TABS
2.0000 | ORAL_TABLET | Freq: Once | ORAL | Status: AC
  Administered 2012-12-08: 2 via ORAL
  Filled 2012-12-08: qty 2

## 2012-12-08 NOTE — ED Notes (Signed)
Pt reports L shoulder pain today. Sts she has limited use of R arm due to lymphedema. Has been compensating for this with use of L arm and has become sore. Today was trying to buckle a seat belt and felt a odd sensation and pain in L shoulder. Started a new pain patch last Thursday, Butrans, that is still on, is supposed to be changed today.

## 2012-12-08 NOTE — ED Provider Notes (Signed)
History     CSN: [id number]  Arrival date & time 12/08/12  1743   First MD Initiated Contact with Patient 12/08/12 1756      Chief Complaint  Patient presents with  . Shoulder Pain    (Consider location/radiation/quality/duration/timing/severity/associated sxs/prior treatment) HPI  Patient complaining of left shoulder pain after hearing sound today when she tried to use left hand to latch seat belt.  Patient has been having pain in left shoulder.  She states she overuses left shoulder due to lymphedema of rue secondary to node resection with breast cancer.  Patient states seen by pmd last week for pain and scheduled for bone scan due to concern of possible fx.  Patient states pain increased today and movement limited. No radiation, pain is severe with movement but better if she holds still.  No numbness or tingling noted.  She does not have an orthopedist.    Past Medical History  Diagnosis Date  . Cancer     breast  . Club foot   . Arthritis   . Thyroid disease   . Hyperlipidemia   . Diabetes mellitus   . Osteoporosis     Past Surgical History  Procedure Laterality Date  . Breast surgery    . Cesarean section    . Thyroidectomy    . Foot surgery      as a child for club foot    Family History  Problem Relation Age of Onset  . Cancer Neg Hx   . Arthritis Neg Hx   . Heart disease Neg Hx   . Hyperlipidemia Neg Hx   . Hypertension Neg Hx   . Dementia Mother   . Hearing loss Mother     History  Substance Use Topics  . Smoking status: Former Games developer  . Smokeless tobacco: Never Used  . Alcohol Use: No    OB History   Grav Para Term Preterm Abortions TAB SAB Ect Mult Living                  Review of Systems  All other systems reviewed and are negative.    Allergies  Review of patient's allergies indicates no known allergies.  Home Medications   Current Outpatient Rx  Name  Route  Sig  Dispense  Refill  . anastrozole (ARIMIDEX) 1 MG tablet    Oral   Take 1 mg by mouth daily.         . Calcium Carbonate-Vitamin D (CALCIUM 600 + D PO)   Oral   Take 1 tablet by mouth.         . diclofenac sodium (VOLTAREN) 1 % GEL   Topical   Apply 2 g topically 4 (four) times daily.   2 Tube   2   . fentaNYL (DURAGESIC) 25 MCG/HR   Transdermal   Place 1 patch (25 mcg total) onto the skin every 3 (three) days.   10 patch   0   . furosemide (LASIX) 40 MG tablet   Oral   Take 40 mg by mouth daily.         Marland Kitchen HYDROcodone-acetaminophen (NORCO/VICODIN) 5-325 MG per tablet   Oral   Take 1 tablet by mouth every 6 (six) hours as needed.   75 tablet   5   . levothyroxine (SYNTHROID, LEVOTHROID) 125 MCG tablet   Oral   Take 1 tablet (125 mcg total) by mouth daily.   90 tablet   1   . metFORMIN (GLUCOPHAGE) 500  MG tablet      TAKE 1 TABLET ONCE DAILY.   30 tablet   5     PATIENT WOULD LIKE 90 DAY SUPPLY   . Polyethyl Glycol-Propyl Glycol (SYSTANE ULTRA OP)   Both Eyes   Place 1 drop into both eyes daily.         . pregabalin (LYRICA) 75 MG capsule   Oral   Take 1 capsule (75 mg total) by mouth 2 (two) times daily.   84 capsule   0     BP 182/110  Pulse 91  Temp(Src) 99.2 F (37.3 C) (Oral)  Resp 24  SpO2 100%  Physical Exam  Nursing note and vitals reviewed. Constitutional: She is oriented to person, place, and time.  HENT:  Head: Normocephalic and atraumatic.  Eyes: Conjunctivae and EOM are normal. Pupils are equal, round, and reactive to light.  Neck: Normal range of motion. Neck supple.  Cardiovascular: Normal rate.   Pulmonary/Chest: Effort normal.  Musculoskeletal: She exhibits tenderness.  Rue with diffuse edem, Lue, wrist mildly ttp, elbow normal, Left shoulder diffuse ttp, no erythema or contusion.   AROM only to 30 abduction and unable to rotate at shoulder.  Grinding sound heard.    Neurological: She is alert and oriented to person, place, and time.  Skin: Skin is warm and dry.  Psychiatric:  She has a normal mood and affect. Her behavior is normal. Judgment and thought content normal.    ED Course  Procedures (including critical care time)  Labs Reviewed - No data to display Dg Shoulder Left  12/08/2012  *RADIOLOGY REPORT*  Clinical Data: Left shoulder pain.  History breast cancer.  LEFT SHOULDER - 2+ VIEW  Comparison: 12/01/2012  Findings: Pathologic fracture through the proximal metadiaphysis of the left humerus noted, along the distal margin of a 4.5 cm proximal humeral lytic lesion.  No other regional fracture observed.  IMPRESSION:  1.  Pathologic transverse fracture of the proximal humeral metadiaphysis along the distal margin of a 4.5 cm proximal humeral lytic lesion concerning for osseous metastatic disease.   Original Report Authenticated By: Gaylyn Rong, M.D.      No diagnosis found.    MDM  Patient will have immobilizer and given ortho referral.  She will follow up with her oncologist for assessment for metastatic disease likely from known breast cancer.         Hilario Quarry, MD 12/09/12 1414

## 2012-12-09 ENCOUNTER — Telehealth: Payer: Self-pay | Admitting: Internal Medicine

## 2012-12-09 NOTE — Telephone Encounter (Signed)
Pt advised that DXA scan does not involve shoulder, Pt will proceed with testing.

## 2012-12-09 NOTE — Telephone Encounter (Signed)
Caller: Christy Harrell/Patient; Phone: [phone]; Reason for Call: Pt wants to know if pt can still have bone density test with arm in an immobilizer (pt states she found out she had a fracture in her shoulder on 12/08/12 - seen in the ED).  Pt also has a question about getting reimbursed from her insurance for the pain patch that Dr Yetta Barre prescribed.  OFFICE PLEASE FOLLOW UP WITH PT

## 2012-12-12 ENCOUNTER — Encounter (HOSPITAL_COMMUNITY)
Admission: RE | Admit: 2012-12-12 | Discharge: 2012-12-12 | Disposition: A | Discharge status: Home / Self Care | Payer: Medicare Other | Source: Ambulatory Visit | Attending: Internal Medicine | Admitting: Internal Medicine

## 2012-12-12 ENCOUNTER — Encounter (HOSPITAL_COMMUNITY): Payer: Self-pay

## 2012-12-12 DIAGNOSIS — M25552 Pain in left hip: Secondary | ICD-10-CM

## 2012-12-12 DIAGNOSIS — M545 Low back pain, unspecified: Secondary | ICD-10-CM | POA: Insufficient documentation

## 2012-12-12 DIAGNOSIS — C50919 Malignant neoplasm of unspecified site of unspecified female breast: Secondary | ICD-10-CM | POA: Insufficient documentation

## 2012-12-12 DIAGNOSIS — M899 Disorder of bone, unspecified: Secondary | ICD-10-CM

## 2012-12-12 DIAGNOSIS — M84429A Pathological fracture, unspecified humerus, initial encounter for fracture: Secondary | ICD-10-CM | POA: Insufficient documentation

## 2012-12-12 DIAGNOSIS — M25559 Pain in unspecified hip: Secondary | ICD-10-CM | POA: Insufficient documentation

## 2012-12-12 DIAGNOSIS — M25512 Pain in left shoulder: Secondary | ICD-10-CM

## 2012-12-12 MED ORDER — TECHNETIUM TC 99M MEDRONATE IV KIT
25.0000 | PACK | Freq: Once | INTRAVENOUS | Status: AC | PRN
  Administered 2012-12-12: 25 via INTRAVENOUS

## 2012-12-14 ENCOUNTER — Telehealth: Payer: Self-pay | Admitting: Oncology

## 2012-12-14 ENCOUNTER — Ambulatory Visit (HOSPITAL_BASED_OUTPATIENT_CLINIC_OR_DEPARTMENT_OTHER): Payer: Medicare Other | Admitting: Lab

## 2012-12-14 ENCOUNTER — Other Ambulatory Visit (HOSPITAL_BASED_OUTPATIENT_CLINIC_OR_DEPARTMENT_OTHER): Payer: Medicare Other | Admitting: Lab

## 2012-12-14 ENCOUNTER — Ambulatory Visit (HOSPITAL_BASED_OUTPATIENT_CLINIC_OR_DEPARTMENT_OTHER): Payer: Medicare Other | Admitting: Oncology

## 2012-12-14 VITALS — BP 141/82 | HR 80 | Temp 98.4°F | Resp 20 | Ht 60.0 in | Wt 225.3 lb

## 2012-12-14 DIAGNOSIS — C50919 Malignant neoplasm of unspecified site of unspecified female breast: Secondary | ICD-10-CM

## 2012-12-14 DIAGNOSIS — C9000 Multiple myeloma not having achieved remission: Secondary | ICD-10-CM

## 2012-12-14 DIAGNOSIS — C50911 Malignant neoplasm of unspecified site of right female breast: Secondary | ICD-10-CM

## 2012-12-14 DIAGNOSIS — M898X9 Other specified disorders of bone, unspecified site: Secondary | ICD-10-CM

## 2012-12-14 DIAGNOSIS — M899 Disorder of bone, unspecified: Secondary | ICD-10-CM

## 2012-12-14 LAB — CBC WITH DIFFERENTIAL/PLATELET
Basophils Absolute: 0.1 10*3/uL (ref 0.0–0.1)
EOS%: 2.7 % (ref 0.0–7.0)
Eosinophils Absolute: 0.2 10*3/uL (ref 0.0–0.5)
HCT: 35.5 % (ref 34.8–46.6)
HGB: 11.8 g/dL (ref 11.6–15.9)
LYMPH%: 36.3 % (ref 14.0–49.7)
MCH: 29.2 pg (ref 25.1–34.0)
MCV: 88.1 fL (ref 79.5–101.0)
MONO%: 8.1 % (ref 0.0–14.0)
NEUT#: 4 10*3/uL (ref 1.5–6.5)
NEUT%: 52.1 % (ref 38.4–76.8)
Platelets: 303 10*3/uL (ref 145–400)

## 2012-12-14 LAB — COMPREHENSIVE METABOLIC PANEL (CC13)
Albumin: 3 g/dL — ABNORMAL LOW (ref 3.5–5.0)
Alkaline Phosphatase: 76 U/L (ref 40–150)
BUN: 14.2 mg/dL (ref 7.0–26.0)
Creatinine: 0.8 mg/dL (ref 0.6–1.1)
Glucose: 85 mg/dl (ref 70–99)
Potassium: 4.3 mEq/L (ref 3.5–5.1)
Total Bilirubin: 0.31 mg/dL (ref 0.20–1.20)

## 2012-12-14 NOTE — Telephone Encounter (Signed)
, °

## 2012-12-14 NOTE — Progress Notes (Signed)
ID: Christy Harrell   DOB: 1941-07-04  MR#: [id number]  WUJ#:[id number]  PCP: Sanda Linger, MD GYN:  SU:  OTHER MD:  Claudette Laws, Gean Birchwood   HISTORY OF PRESENT ILLNESS: The patient had routine screening mammography 10/16/2010 showing a lobulated mass in the right subareolar region measuring up to 5.5 cm. The Left breast showed some central microcalcifications. Biopsy of the Right breast mass on 10/28/2010 showed an invasive ductal carcinoma, grade 2, estrogen receptor positive (Allred score 8), progesterone receptor positive (Allred score 5), with an equivocal HER-2. The Left breast area of microcalcifications was biopsied at the same time, and was read as suspicious for DCIS.  On 11/17/2010 the patient underwent Right lumpectomy and axillary lymph node dissection for what proved to be a 3 cm invasive ductal carcinoma, grade 2, with some papillary and mucinous features. One of 16 lymph nodes was involved. FISH showed no HER-2 amplification. Left breast biopsy was benign.  The patient had an Oncotype sent, with a score of 27, predicting a risk of distant recurrence after 5 years of tamoxifen in the 18% range. With this intermediate result, the decision was made not to proceed with chemotherapy, since the patient is the sole caregiver to her very elderly mother. Instead the patient proceeded to radiation treatment which was completed 03/06/2011. She started anastrozole at that point. Her subsequent history is as detailed below   INTERVAL HISTORY: Christy Harrell returns today for followup of her breast cancer accompanied by her uncle. The interval history is significant for having fractured her left humerus last week. There was no trauma. She was sitting on the plus and trying to get her seatbelt on when her left arm "pop" and she felt a sharp pain. She was evaluated in the emergency department 12/08/2012, where left shoulder films showed a pathologic transverse fracture of the proximal humeral  metadiaphysis along the distal margin of a 4.5 cm proximal humeral lytic lesion concerning for osseous metastatic disease. The patient was set up with a bone scan which showed only the left humeral fracture as areas of concern. She is scheduled to see orthopedics tomorrow.  REVIEW OF SYSTEMS: Aside from the above, sessile and he is very stable. She continues to have problems with her diabetes and thyroid issues, and continues to have hot flashes. The pain from the left humeral fracture is well-controlled on her current medications, but she tells me they are very expensive and she is working through Dr. Yetta Barre' office to try to get generics. Otherwise a detailed review of systems today was noncontributory, and in particular she denies bleeding, rash, fever, unusual headaches, visual changes, dizziness, or gait imbalance. I think they help  PAST MEDICAL HISTORY: Past Medical History  Diagnosis Date  . Cancer     breast  . Club foot   . Arthritis   . Thyroid disease   . Hyperlipidemia   . Diabetes mellitus   . Osteoporosis     PAST SURGICAL HISTORY: Past Surgical History  Procedure Laterality Date  . Breast surgery    . Cesarean section    . Thyroidectomy    . Foot surgery      as a child for club foot    FAMILY HISTORY Family History  Problem Relation Age of Onset  . Cancer Neg Hx   . Arthritis Neg Hx   . Heart disease Neg Hx   . Hyperlipidemia Neg Hx   . Hypertension Neg Hx   . Dementia Mother   . Hearing loss Mother  The patient's father died from unknown causes. The patient's mother is alive at age 59. The patient was a single child. There is no history of breast or ovarian cancer in the family to her knowledge.  GYNECOLOGIC HISTORY: Menarche age 53, menopause in her early 76s. The patient is GX P1, first pregnancy to term age 64. She did not take hormone replacement  SOCIAL HISTORY: The patient grew up in [place], attended [employer], then moved to [place]  where she lived about 40 years. 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 Her mother, Christy Harrell, 75, lives with the patient. The patient's daughter Christy Harrell, 32, is disabled secondary to pulmonary hypertension. She can be reached at [phone]. The patient has 2 grandchildren, Christy Harrell who works at a Human resources officer business and is 72 years old, in general, 35, completing high school.   ADVANCED DIRECTIVES:  HEALTH MAINTENANCE: History  Substance Use Topics  . Smoking status: Former Games developer  . Smokeless tobacco: Never Used  . Alcohol Use: No     Colonoscopy:  PAP:    Bone density:  2012, on ibandronate chronically  Lipid panel: UTD  No Known Allergies  Current Outpatient Prescriptions  Medication Sig Dispense Refill  . anastrozole (ARIMIDEX) 1 MG tablet Take 1 mg by mouth daily.      . Calcium Carbonate-Vitamin D (CALCIUM 600 + D PO) Take 1 tablet by mouth.      . diclofenac sodium (VOLTAREN) 1 % GEL Apply 2 g topically 4 (four) times daily.  2 Tube  2  . fentaNYL (DURAGESIC) 25 MCG/HR Place 1 patch (25 mcg total) onto the skin every 3 (three) days.  10 patch  0  . furosemide (LASIX) 40 MG tablet Take 40 mg by mouth daily.      Marland Kitchen HYDROcodone-acetaminophen (NORCO/VICODIN) 5-325 MG per tablet Take 1 tablet by mouth every 6 (six) hours as needed.  75 tablet  5  . levothyroxine (SYNTHROID, LEVOTHROID) 125 MCG tablet Take 1 tablet (125 mcg total) by mouth daily.  90 tablet  1  . metFORMIN (GLUCOPHAGE) 500 MG tablet TAKE 1 TABLET ONCE DAILY.  30 tablet  5  . oxyCODONE-acetaminophen (PERCOCET/ROXICET) 5-325 MG per tablet Take 2 tablets by mouth every 4 (four) hours as needed for pain.  15 tablet  0  . Polyethyl Glycol-Propyl Glycol (SYSTANE ULTRA OP) Place 1 drop into both eyes daily.      . pregabalin (LYRICA) 75 MG capsule Take 1 capsule (75 mg total) by mouth 2 (two) times daily.  84 capsule  0   No current facility-administered medications for  this visit.    OBJECTIVE: Middle-aged [place] American woman with her left arm in a sling Filed Vitals:   12/14/12 1406  BP: 141/82  Pulse: 80  Temp: 98.4 F (36.9 C)  Resp: 20     Body mass index is 44 kg/(m^2).    ECOG FS: 2 Filed Weights   12/14/12 1406  Weight: 225 lb 4.8 oz (102.195 kg)     Sclerae unicteric  Oropharynx clear  No cervical or supraclavicular adenopathy  Lungs clear -- no rales or rhonchi  Heart regular rate and rhythm  Abdomen obese, benign, soft, nontender. Positive bowel sounds   MSK scoliosis but no focal spinal tenderness; grade 3 right upper extremity lymphedema, which is chronic. I did not palpate the left upper arm. There was mild swelling, no erythema of the left hand.  Neuro nonfocal, well oriented,  concerned affect  Breast exam: Right breast status post lumpectomy; no evidence of local recurrence. The right axilla is benign. Left breast no suspicious masses.   LAB RESULTS: Lab Results  Component Value Date   WBC 7.7 12/14/2012   NEUTROABS 4.0 12/14/2012   HGB 11.8 12/14/2012   HCT 35.5 12/14/2012   MCV 88.1 12/14/2012   PLT 303 12/14/2012      Chemistry      Component Value Date/Time   NA 141 12/14/2012 1354   NA 139 07/27/2012 1401   K 4.3 12/14/2012 1354   K 4.2 07/27/2012 1401   CL 106 12/14/2012 1354   CL 104 07/27/2012 1401   CO2 27 12/14/2012 1354   CO2 30 07/27/2012 1401   BUN 14.2 12/14/2012 1354   BUN 20 07/27/2012 1401   CREATININE 0.8 12/14/2012 1354   CREATININE 0.8 07/27/2012 1401      Component Value Date/Time   CALCIUM 9.4 12/14/2012 1354   CALCIUM 9.7 07/27/2012 1401   ALKPHOS 76 12/14/2012 1354   ALKPHOS 75 07/27/2012 1401   AST 13 12/14/2012 1354   AST 17 07/27/2012 1401   ALT 9 12/14/2012 1354   ALT 14 07/27/2012 1401   BILITOT 0.31 12/14/2012 1354   BILITOT 0.4 07/27/2012 1401       Lab Results  Component Value Date   LABCA2 24 11/19/2011    STUDIES: Nm Bone Scan Whole Body  12/12/2012  *RADIOLOGY REPORT*  Clinical Data:  72 year old female with breast cancer.  Recent proximal left humeral fracture felt to be pathologic.  NUCLEAR MEDICINE WHOLE BODY BONE SCINTIGRAPHY  Technique:  Whole body anterior and posterior images were obtained approximately 3 hours after intravenous injection of radiopharmaceutical.  Radiopharmaceutical:  25 mCi technetium 66m MDP  Comparison: 12/11/2012 radiographs  Findings: Increased focal activity at the left humeral neck is compatible with a fracture identified on recent radiographs.  It is difficult to determine if there is a pathologic lesion exhibiting increased activity.  No other focal areas of abnormal activity are identified to suggest bony metastatic disease. Degenerative changes within the hands, knees, feet and lower lumbar spine identified.  IMPRESSION: Increased activity at the left humeral neck compatible with fracture. It is difficult to determine if underlying lesion with increased activity is present.  No other focal areas of abnormal bony activity noted to suggest bony metastatic disease.   Original Report Authenticated By: Harmon Pier, M.D.     ASSESSMENT: 72 y.o.  [place] woman   (1)  status post right lumpectomy and axillary lymph node dissection 11/17/2010 for a pT2 pN1, stage IIB invasive ductal carcinoma, grade 2, estrogen and progesterone receptor positive, HER-2 negative,   (2) Oncotype DX recurrence score of 27 predicting a risk of distant recurrence of 18% with 5 years of tamoxifen (intermediate score);   (3)  status post radiation completed July of 2012,   (4) started anastrozole July 2012.  (5) Chronic lymphedema in the right upper extremity.  (6) pathologic fracture of the left humerus along apparent lytic lesion, with no other bone lesions per bone scan 12/12/2012  PLAN: Her lab work is only significant for a mildly decreased albumin, but this does make her globulin fraction slightly high. Accordingly I am obtaining an SPEP and kappa/ lambda light chains  today. If this is breast cancer the pattern of spread would be unusual but not impossible. Accordingly if the screening studies for myeloma are negative I will set her up for a PET scan before  her return visit here.  If she undergoes any orthopedic procedure, it would be helpful to have a biopsy of the lesion sent for definitive diagnosis.  The patient will see me again May 16. She knows to call for any other problems that may develop before that visit.   Aysen Shieh C    12/14/2012

## 2012-12-16 ENCOUNTER — Ambulatory Visit
Admission: RE | Admit: 2012-12-16 | Discharge: 2012-12-16 | Disposition: A | Discharge status: Home / Self Care | Payer: Medicare Other | Source: Ambulatory Visit | Attending: Orthopedic Surgery | Admitting: Orthopedic Surgery

## 2012-12-16 ENCOUNTER — Other Ambulatory Visit: Payer: Self-pay | Admitting: Orthopedic Surgery

## 2012-12-16 ENCOUNTER — Other Ambulatory Visit: Payer: Medicare Other

## 2012-12-16 DIAGNOSIS — M25512 Pain in left shoulder: Secondary | ICD-10-CM

## 2012-12-16 LAB — SPEP & IFE WITH QIG
Albumin ELP: 50.5 % — ABNORMAL LOW (ref 55.8–66.1)
Beta 2: 7.6 % — ABNORMAL HIGH (ref 3.2–6.5)
Gamma Globulin: 16.2 % (ref 11.1–18.8)
IgA: 452 mg/dL — ABNORMAL HIGH (ref 69–380)
IgM, Serum: 85 mg/dL (ref 52–322)

## 2012-12-16 LAB — KAPPA/LAMBDA LIGHT CHAINS
Kappa free light chain: 2.3 mg/dL — ABNORMAL HIGH (ref 0.33–1.94)
Kappa:Lambda Ratio: 1.28 (ref 0.26–1.65)

## 2012-12-21 ENCOUNTER — Other Ambulatory Visit: Payer: Self-pay | Admitting: Orthopedic Surgery

## 2012-12-22 ENCOUNTER — Encounter (HOSPITAL_BASED_OUTPATIENT_CLINIC_OR_DEPARTMENT_OTHER): Payer: Self-pay | Admitting: *Deleted

## 2012-12-22 NOTE — Progress Notes (Signed)
Pt obese yet denies snoring-her aunt and uncle staying with her-hx br ca-rt lumpectomy and 16 nodes removed 2012[place]- There is some question if bone mets cause fx-seeing oncology in [place]-to come in Monday for bmet-ekg-but they may do blood cancer center.

## 2012-12-23 ENCOUNTER — Encounter: Payer: Self-pay | Admitting: Physician Assistant

## 2012-12-23 ENCOUNTER — Encounter (HOSPITAL_BASED_OUTPATIENT_CLINIC_OR_DEPARTMENT_OTHER)
Admission: RE | Admit: 2012-12-23 | Discharge: 2012-12-23 | Disposition: A | Discharge status: Home / Self Care | Payer: Medicare Other | Source: Ambulatory Visit | Attending: Orthopedic Surgery | Admitting: Orthopedic Surgery

## 2012-12-23 ENCOUNTER — Ambulatory Visit (HOSPITAL_BASED_OUTPATIENT_CLINIC_OR_DEPARTMENT_OTHER): Payer: Medicare Other | Admitting: Physician Assistant

## 2012-12-23 ENCOUNTER — Telehealth: Payer: Self-pay | Admitting: Oncology

## 2012-12-23 ENCOUNTER — Other Ambulatory Visit: Payer: Self-pay | Admitting: *Deleted

## 2012-12-23 VITALS — BP 146/83 | HR 79 | Temp 98.5°F | Resp 20 | Ht 60.0 in | Wt 223.0 lb

## 2012-12-23 DIAGNOSIS — M899 Disorder of bone, unspecified: Secondary | ICD-10-CM

## 2012-12-23 DIAGNOSIS — C50911 Malignant neoplasm of unspecified site of right female breast: Secondary | ICD-10-CM

## 2012-12-23 DIAGNOSIS — C50919 Malignant neoplasm of unspecified site of unspecified female breast: Secondary | ICD-10-CM

## 2012-12-23 DIAGNOSIS — Z780 Asymptomatic menopausal state: Secondary | ICD-10-CM

## 2012-12-23 DIAGNOSIS — E559 Vitamin D deficiency, unspecified: Secondary | ICD-10-CM | POA: Insufficient documentation

## 2012-12-23 LAB — BASIC METABOLIC PANEL
BUN: 13 mg/dL (ref 6–23)
CO2: 28 mEq/L (ref 19–32)
Calcium: 9.7 mg/dL (ref 8.4–10.5)
GFR calc non Af Amer: 70 mL/min — ABNORMAL LOW (ref >90)
Glucose, Bld: 105 mg/dL — ABNORMAL HIGH (ref 70–99)
Sodium: 140 mEq/L (ref 135–145)

## 2012-12-23 NOTE — Progress Notes (Signed)
ID: Etter Sjogren   DOB: January 23, 1941  MR#: [id number]  CSN#:[id number]  PCP: Sanda Linger, MD GYN:  SU:  OTHER MD:  Claudette Laws, Gean Birchwood   HISTORY OF PRESENT ILLNESS: The patient had routine screening mammography 10/16/2010 showing a lobulated mass in the right subareolar region measuring up to 5.5 cm. The Left breast showed some central microcalcifications. Biopsy of the Right breast mass on 10/28/2010 showed an invasive ductal carcinoma, grade 2, estrogen receptor positive (Allred score 8), progesterone receptor positive (Allred score 5), with an equivocal HER-2. The Left breast area of microcalcifications was biopsied at the same time, and was read as suspicious for DCIS.  On 11/17/2010 the patient underwent Right lumpectomy and axillary lymph node dissection for what proved to be a 3 cm invasive ductal carcinoma, grade 2, with some papillary and mucinous features. One of 16 lymph nodes was involved. FISH showed no HER-2 amplification. Left breast biopsy was benign.  The patient had an Oncotype sent, with a score of 27, predicting a risk of distant recurrence after 5 years of tamoxifen in the 18% range. With this intermediate result, the decision was made not to proceed with chemotherapy, since the patient is the sole caregiver to her very elderly mother. Instead the patient proceeded to radiation treatment which was completed 03/06/2011. She started anastrozole at that point. Her subsequent history is as detailed below   INTERVAL HISTORY: Rhianna returns today for followup of her right breast cancer. Of course, the interval history is significant for having fractured her left humerus, and she scheduled for her surgery under the care of Dr. Ave Filter next week on 12/27/2012. Her left arm is still in a sling. She still has some pain and discomfort, especially with movement. She is very limited in her day-to-day activities at this time secondary to the limited range of motion in the  left arm, and the severe lymphedema in the right arm. She still has her compression sleeve intact.   REVIEW OF SYSTEMS: Other than the lymphedema and the pain in the left shoulder, patient is actually feeling well. She is a little anxious about the upcoming surgery, and certainly about the associated biopsy. She's had no recent illnesses and denies any fevers or chills. She does have hot flashes. She's eating and drinking well denies any nausea or change in bowel habits. She's had no abnormal headaches, dizziness, or changes in vision. She denies any abnormal bleeding, bruises, or rashes. She also denies any new cough, increased shortness of breath, chest pain, or palpitations.  A detailed review of systems is otherwise stable and noncontributory today.    PAST MEDICAL HISTORY: Past Medical History  Diagnosis Date  . Cancer     breast  . Club foot   . Arthritis   . Thyroid disease   . Hyperlipidemia   . Diabetes mellitus   . Osteoporosis   . Hypertension   . GERD (gastroesophageal reflux disease)     watches diet  . Neuromuscular disorder     lt arm numb sometimes  . Wears glasses     PAST SURGICAL HISTORY: Past Surgical History  Procedure Laterality Date  . Cesarean section    . Thyroidectomy  2002  . Foot surgery      as a child for club foot  . Breast surgery  2012    rt lump-16 nodes-in [place]  . Cesarean section    . Colonoscopy    . Dilation and curettage of uterus  FAMILY HISTORY Family History  Problem Relation Age of Onset  . Cancer Neg Hx   . Arthritis Neg Hx   . Heart disease Neg Hx   . Hyperlipidemia Neg Hx   . Hypertension Neg Hx   . Dementia Mother   . Hearing loss Mother    The patient's father died from unknown causes. The patient's mother is alive at age 80. The patient was a single child. There is no history of breast or ovarian cancer in the family to her knowledge.  GYNECOLOGIC HISTORY: Menarche age 28, menopause in her early 62s. The patient  is GX P1, first pregnancy to term age 32. She did not take hormone replacement  SOCIAL HISTORY: The patient grew up in [place], attended [employer], then moved to [place] where she lived about 40 years. 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 Her mother, [name], 33, lives with the patient. The patient's daughter [name], 8, is disabled secondary to pulmonary hypertension. She can be reached at [phone]. The patient has 2 grandchildren, [name] who works at a Human resources officer business and is 72 years old, in general, 25, completing high school.   ADVANCED DIRECTIVES:  HEALTH MAINTENANCE: History  Substance Use Topics  . Smoking status: Former Smoker    Quit date: 12/23/1974  . Smokeless tobacco: Never Used  . Alcohol Use: No     Colonoscopy:  PAP:    Bone density:  2012, on ibandronate chronically  Lipid panel: UTD  No Known Allergies  Current Outpatient Prescriptions  Medication Sig Dispense Refill  . anastrozole (ARIMIDEX) 1 MG tablet Take 1 mg by mouth daily.      . Calcium Carbonate-Vitamin D (CALCIUM 600 + D PO) Take 1 tablet by mouth.      . diclofenac sodium (VOLTAREN) 1 % GEL Apply 2 g topically 4 (four) times daily.  2 Tube  2  . fentaNYL (DURAGESIC - DOSED MCG/HR) 25 MCG/HR Place 1 patch onto the skin every 3 (three) days.      . furosemide (LASIX) 40 MG tablet Take 40 mg by mouth daily.      Marland Kitchen HYDROcodone-acetaminophen (NORCO/VICODIN) 5-325 MG per tablet Take 1 tablet by mouth every 6 (six) hours as needed.  75 tablet  5  . levothyroxine (SYNTHROID, LEVOTHROID) 125 MCG tablet Take 1 tablet (125 mcg total) by mouth daily.  90 tablet  1  . metFORMIN (GLUCOPHAGE) 500 MG tablet TAKE 1 TABLET ONCE DAILY.  30 tablet  5  . oxyCODONE-acetaminophen (PERCOCET/ROXICET) 5-325 MG per tablet Take 2 tablets by mouth every 4 (four) hours as needed for pain.  15 tablet  0  . Polyethyl Glycol-Propyl Glycol (SYSTANE ULTRA OP) Place  1 drop into both eyes daily.      . pregabalin (LYRICA) 75 MG capsule Take 75 mg by mouth 2 (two) times daily. Not taking       No current facility-administered medications for this visit.    OBJECTIVE: Middle-aged [place] American woman with her left arm in a sling Filed Vitals:   12/23/12 1352  BP: 146/83  Pulse: 79  Temp: 98.5 F (36.9 C)  Resp: 20     Body mass index is 43.56 kg/(m^2).    ECOG FS: 2 Filed Weights   12/23/12 1352  Weight: 223 lb 0.5 oz (101.166 kg)   Remainder of physical exam was deferred today.      LAB RESULTS: Lab Results  Component Value Date  WBC 7.7 12/14/2012   NEUTROABS 4.0 12/14/2012   HGB 11.8 12/14/2012   HCT 35.5 12/14/2012   MCV 88.1 12/14/2012   PLT 303 12/14/2012      Chemistry      Component Value Date/Time   NA 140 12/23/2012 1100   NA 141 12/14/2012 1354   K 4.5 12/23/2012 1100   K 4.3 12/14/2012 1354   CL 104 12/23/2012 1100   CL 106 12/14/2012 1354   CO2 28 12/23/2012 1100   CO2 27 12/14/2012 1354   BUN 13 12/23/2012 1100   BUN 14.2 12/14/2012 1354   CREATININE 0.82 12/23/2012 1100   CREATININE 0.8 12/14/2012 1354      Component Value Date/Time   CALCIUM 9.7 12/23/2012 1100   CALCIUM 9.4 12/14/2012 1354   ALKPHOS 76 12/14/2012 1354   ALKPHOS 75 07/27/2012 1401   AST 13 12/14/2012 1354   AST 17 07/27/2012 1401   ALT 9 12/14/2012 1354   ALT 14 07/27/2012 1401   BILITOT 0.31 12/14/2012 1354   BILITOT 0.4 07/27/2012 1401       Lab Results  Component Value Date   LABCA2 24 11/19/2011    STUDIES:  12/17/2012 *RADIOLOGY REPORT*  Clinical Data: Pathologic fracture of the left humeral head.  History of injury 2 weeks ago. History of right breast cancer.  CT OF THE LEFT SHOULDER WITHOUT CONTRAST  Technique: Multidetector CT imaging was performed according to the  standard protocol. Multiplanar CT image reconstructions were also  generated.  Comparison: Radiographs 12/08/2012.Whole body bone scan and  12/12/2012.  Findings: There is an osteolytic  lesion occupying almost all of the  left humeral head and neck. The lesion in the humeral head and neck  measures at least true axial dimensions are difficult to obtain  because of positioning of the humerus. Best estimates of size are  4 cm x 4 cm. Regardless, the tumor occupies the entire humeral  neck with permeative lytic change of the cortex. Nondisplaced  transverse fracture of the proximal humeral metaphysis. There is  no definite extraosseous extension. The humNondisplaced located.  There is no AVN or collapse. The reformatted images are in  nonstandard reconstructions because of obese body habitus. The  patient was unable to put the arms at the sided because of body  habitus.  Incidental visualization of the chest shows no airspace disease or  pulmonary nodules. Coronary artery atherosclerosis is present. If  office based assessment of coronary risk factors has not been  performed, it is now recommended. Aortic atherosclerosis is  present. The cervical and thoracic spine demonstrates degenerative  disease. There are no other destructive osseous lesions  identified. Moderate AC joint osteoarthritis.  IMPRESSION:  1. Left humeral head and neck mass with pathologic nondisplaced  transverse fracture.  2. Destructive lesion in the humeral head and neck measures at  least 4 cm x 4 cm x 5.4 cm and extends into the proximal  metadiaphysis.  3. The lesion is consistent with neoplasm. Differential  considerations are metastatic disease in this patient with history  of breast cancer, lymphoma, or plasmacytoma associated with  multiple myeloma.  4. The whole body bone scan did not show any other foci of  metastatic disease. No other foci of metastatic disease are  identified on the CT scan.    Nm Bone Scan Whole Body  12/12/2012  *RADIOLOGY REPORT*  Clinical Data: 72 year old female with breast cancer.  Recent proximal left humeral fracture felt to be pathologic.  NUCLEAR MEDICINE  WHOLE BODY BONE SCINTIGRAPHY  Technique:  Whole body anterior and posterior images were obtained approximately 3 hours after intravenous injection of radiopharmaceutical.  Radiopharmaceutical:  25 mCi technetium 30m MDP  Comparison: 12/11/2012 radiographs  Findings: Increased focal activity at the left humeral neck is compatible with a fracture identified on recent radiographs.  It is difficult to determine if there is a pathologic lesion exhibiting increased activity.  No other focal areas of abnormal activity are identified to suggest bony metastatic disease. Degenerative changes within the hands, knees, feet and lower lumbar spine identified.  IMPRESSION: Increased activity at the left humeral neck compatible with fracture. It is difficult to determine if underlying lesion with increased activity is present.  No other focal areas of abnormal bony activity noted to suggest bony metastatic disease.   Original Report Authenticated By: Harmon Pier, M.D.     ASSESSMENT: 72 y.o.  [place] woman   (1)  status post right lumpectomy and axillary lymph node dissection 11/17/2010 for a pT2 pN1, stage IIB invasive ductal carcinoma, grade 2, estrogen and progesterone receptor positive, HER-2 negative,   (2) Oncotype DX recurrence score of 27 predicting a risk of distant recurrence of 18% with 5 years of tamoxifen (intermediate score);   (3)  status post radiation completed July of 2012,   (4) started anastrozole July 2012.  (5) Chronic lymphedema in the right upper extremity.  (6) pathologic fracture of the left humerus along apparent lytic lesion, with no other bone lesions per bone scan 12/12/2012  PLAN:  Our entire 50 minute appointment today was spent reviewing patient's recent studies and labs, counseling her regarding possible diagnoses, and coordinating care. Dr. Darnelle Catalan has reviewed the patient's labs, and is comfortable that she does not have a diagnosis of multiple myeloma at this time. We will,  however, repeat these labs in approximately 6 weeks.  As noted above, she will proceed to surgery to the left shoulder and the care of Dr. Ave Filter next week on 12/27/2012. Hopefully he will be able to biopsy of the lytic lesion in the left humerus for definitive diagnosis. Per Dr. Darrall Dears prior plan, we will also schedule her for a PET scan for further evaluation. She return to see [place] in mid June to review the pathologic results, PET scan, and the repeat labs.  I have spoken with her daughter, Park Meo, by phone per patient request to explain all of the above. Both Khalia and her daughter voice understanding and agreement with this plan. Of course she knows to call with any changes or problems prior to her next scheduled appointment. In the meanwhile, she will be continuing on the anastrozole as before.  Of note, Avanthika would also like a referral to a local GYN practice which we will glad to help her with today.   Cherryl Babin    12/23/2012

## 2012-12-23 NOTE — Telephone Encounter (Signed)
, °

## 2012-12-27 ENCOUNTER — Encounter (HOSPITAL_BASED_OUTPATIENT_CLINIC_OR_DEPARTMENT_OTHER): Payer: Self-pay | Admitting: Anesthesiology

## 2012-12-27 ENCOUNTER — Ambulatory Visit (HOSPITAL_COMMUNITY): Payer: Medicare Other

## 2012-12-27 ENCOUNTER — Ambulatory Visit (HOSPITAL_BASED_OUTPATIENT_CLINIC_OR_DEPARTMENT_OTHER)
Admission: RE | Admit: 2012-12-27 | Discharge: 2012-12-28 | Disposition: A | Discharge status: Home / Self Care | Payer: Medicare Other | Source: Ambulatory Visit | Attending: Orthopedic Surgery | Admitting: Orthopedic Surgery

## 2012-12-27 ENCOUNTER — Ambulatory Visit (HOSPITAL_BASED_OUTPATIENT_CLINIC_OR_DEPARTMENT_OTHER): Payer: Medicare Other | Admitting: Anesthesiology

## 2012-12-27 ENCOUNTER — Encounter (HOSPITAL_BASED_OUTPATIENT_CLINIC_OR_DEPARTMENT_OTHER)
Admission: RE | Disposition: A | Discharge status: Home / Self Care | Payer: Self-pay | Source: Ambulatory Visit | Attending: Orthopedic Surgery

## 2012-12-27 ENCOUNTER — Encounter (HOSPITAL_BASED_OUTPATIENT_CLINIC_OR_DEPARTMENT_OTHER): Payer: Self-pay

## 2012-12-27 DIAGNOSIS — K219 Gastro-esophageal reflux disease without esophagitis: Secondary | ICD-10-CM | POA: Insufficient documentation

## 2012-12-27 DIAGNOSIS — M899 Disorder of bone, unspecified: Secondary | ICD-10-CM | POA: Insufficient documentation

## 2012-12-27 DIAGNOSIS — I10 Essential (primary) hypertension: Secondary | ICD-10-CM | POA: Insufficient documentation

## 2012-12-27 DIAGNOSIS — E119 Type 2 diabetes mellitus without complications: Secondary | ICD-10-CM | POA: Insufficient documentation

## 2012-12-27 DIAGNOSIS — S43429A Sprain of unspecified rotator cuff capsule, initial encounter: Secondary | ICD-10-CM | POA: Insufficient documentation

## 2012-12-27 DIAGNOSIS — C7951 Secondary malignant neoplasm of bone: Secondary | ICD-10-CM | POA: Insufficient documentation

## 2012-12-27 DIAGNOSIS — R209 Unspecified disturbances of skin sensation: Secondary | ICD-10-CM | POA: Insufficient documentation

## 2012-12-27 DIAGNOSIS — S42202D Unspecified fracture of upper end of left humerus, subsequent encounter for fracture with routine healing: Secondary | ICD-10-CM

## 2012-12-27 DIAGNOSIS — E079 Disorder of thyroid, unspecified: Secondary | ICD-10-CM | POA: Insufficient documentation

## 2012-12-27 DIAGNOSIS — M949 Disorder of cartilage, unspecified: Secondary | ICD-10-CM | POA: Insufficient documentation

## 2012-12-27 DIAGNOSIS — M810 Age-related osteoporosis without current pathological fracture: Secondary | ICD-10-CM | POA: Insufficient documentation

## 2012-12-27 DIAGNOSIS — Z853 Personal history of malignant neoplasm of breast: Secondary | ICD-10-CM | POA: Insufficient documentation

## 2012-12-27 DIAGNOSIS — X58XXXA Exposure to other specified factors, initial encounter: Secondary | ICD-10-CM | POA: Insufficient documentation

## 2012-12-27 DIAGNOSIS — Z87891 Personal history of nicotine dependence: Secondary | ICD-10-CM | POA: Insufficient documentation

## 2012-12-27 DIAGNOSIS — M84429A Pathological fracture, unspecified humerus, initial encounter for fracture: Secondary | ICD-10-CM | POA: Insufficient documentation

## 2012-12-27 DIAGNOSIS — Q6689 Other  specified congenital deformities of feet: Secondary | ICD-10-CM | POA: Insufficient documentation

## 2012-12-27 DIAGNOSIS — Z79899 Other long term (current) drug therapy: Secondary | ICD-10-CM | POA: Insufficient documentation

## 2012-12-27 DIAGNOSIS — E785 Hyperlipidemia, unspecified: Secondary | ICD-10-CM | POA: Insufficient documentation

## 2012-12-27 DIAGNOSIS — Z6841 Body Mass Index (BMI) 40.0 and over, adult: Secondary | ICD-10-CM | POA: Insufficient documentation

## 2012-12-27 HISTORY — PX: HUMERUS IM NAIL: SHX1769

## 2012-12-27 HISTORY — DX: Myoneural disorder, unspecified: G70.9

## 2012-12-27 HISTORY — DX: Gastro-esophageal reflux disease without esophagitis: K21.9

## 2012-12-27 HISTORY — DX: Essential (primary) hypertension: I10

## 2012-12-27 HISTORY — DX: Presence of spectacles and contact lenses: Z97.3

## 2012-12-27 SURGERY — INSERTION, INTRAMEDULLARY ROD, HUMERUS
Anesthesia: General | Site: Arm Upper | Laterality: Left | Wound class: Clean

## 2012-12-27 MED ORDER — OXYCODONE HCL 5 MG PO TABS: ORAL_TABLET | ORAL | Status: DC

## 2012-12-27 MED ORDER — FLEET ENEMA 7-19 GM/118ML RE ENEM: 1.0000 | ENEMA | Freq: Once | RECTAL | Status: AC | PRN

## 2012-12-27 MED ORDER — FUROSEMIDE 40 MG PO TABS: 40.0000 mg | ORAL_TABLET | Freq: Every day | ORAL | Status: DC

## 2012-12-27 MED ORDER — ZOLPIDEM TARTRATE 5 MG PO TABS: 5.0000 mg | ORAL_TABLET | Freq: Every evening | ORAL | Status: DC | PRN

## 2012-12-27 MED ORDER — ONDANSETRON HCL 4 MG PO TABS: 4.0000 mg | ORAL_TABLET | Freq: Four times a day (QID) | ORAL | Status: DC | PRN

## 2012-12-27 MED ORDER — LEVOTHYROXINE SODIUM 125 MCG PO TABS: 125.0000 ug | ORAL_TABLET | Freq: Every day | ORAL | Status: DC

## 2012-12-27 MED ORDER — METOCLOPRAMIDE HCL 5 MG PO TABS: 5.0000 mg | ORAL_TABLET | Freq: Three times a day (TID) | ORAL | Status: DC | PRN

## 2012-12-27 MED ORDER — MIDAZOLAM HCL 2 MG/2ML IJ SOLN: 1.0000 mg | INTRAMUSCULAR | Status: DC | PRN

## 2012-12-27 MED ORDER — OXYCODONE HCL 5 MG PO TABS: 5.0000 mg | ORAL_TABLET | Freq: Once | ORAL | Status: DC | PRN

## 2012-12-27 MED ORDER — METFORMIN HCL 500 MG PO TABS
500.0000 mg | ORAL_TABLET | Freq: Every day | ORAL | Status: DC
  Administered 2012-12-28: 500 mg via ORAL

## 2012-12-27 MED ORDER — ONDANSETRON HCL 4 MG/2ML IJ SOLN: 4.0000 mg | Freq: Once | INTRAMUSCULAR | Status: DC | PRN

## 2012-12-27 MED ORDER — OXYCODONE-ACETAMINOPHEN 5-325 MG PO TABS
1.0000 | ORAL_TABLET | ORAL | Status: DC | PRN
  Administered 2012-12-27 – 2012-12-28 (×3): 2 via ORAL

## 2012-12-27 MED ORDER — PROPOFOL 10 MG/ML IV BOLUS
INTRAVENOUS | Status: DC | PRN
  Administered 2012-12-27: 150 mg via INTRAVENOUS

## 2012-12-27 MED ORDER — FENTANYL 25 MCG/HR TD PT72: 25.0000 ug | MEDICATED_PATCH | TRANSDERMAL | Status: DC

## 2012-12-27 MED ORDER — ANASTROZOLE 1 MG PO TABS: 1.0000 mg | ORAL_TABLET | Freq: Every day | ORAL | Status: DC

## 2012-12-27 MED ORDER — POVIDONE-IODINE 7.5 % EX SOLN: Freq: Once | CUTANEOUS | Status: DC

## 2012-12-27 MED ORDER — DIPHENHYDRAMINE HCL 12.5 MG/5ML PO ELIX: 12.5000 mg | ORAL_SOLUTION | ORAL | Status: DC | PRN

## 2012-12-27 MED ORDER — HYDROMORPHONE HCL PF 1 MG/ML IJ SOLN
0.2500 mg | INTRAMUSCULAR | Status: DC | PRN
  Administered 2012-12-27 (×3): 0.5 mg via INTRAVENOUS
  Administered 2012-12-27: 0.25 mg via INTRAVENOUS

## 2012-12-27 MED ORDER — OXYCODONE HCL 5 MG/5ML PO SOLN: 5.0000 mg | Freq: Once | ORAL | Status: DC | PRN

## 2012-12-27 MED ORDER — CEFAZOLIN SODIUM-DEXTROSE 2-3 GM-% IV SOLR
INTRAVENOUS | Status: DC | PRN
  Administered 2012-12-27: 2 g via INTRAVENOUS

## 2012-12-27 MED ORDER — HYDROCODONE-ACETAMINOPHEN 5-325 MG PO TABS: 1.0000 | ORAL_TABLET | ORAL | Status: DC | PRN

## 2012-12-27 MED ORDER — SODIUM CHLORIDE 0.9 % IV SOLN
INTRAVENOUS | Status: DC
  Administered 2012-12-27: 20:00:00 via INTRAVENOUS

## 2012-12-27 MED ORDER — ONDANSETRON HCL 4 MG/2ML IJ SOLN: 4.0000 mg | Freq: Four times a day (QID) | INTRAMUSCULAR | Status: DC | PRN

## 2012-12-27 MED ORDER — KETOROLAC TROMETHAMINE 30 MG/ML IJ SOLN
30.0000 mg | Freq: Once | INTRAMUSCULAR | Status: AC
  Administered 2012-12-27: 30 mg via INTRAVENOUS

## 2012-12-27 MED ORDER — ASPIRIN EC 325 MG PO TBEC
325.0000 mg | DELAYED_RELEASE_TABLET | Freq: Two times a day (BID) | ORAL | Status: DC
  Administered 2012-12-27 – 2012-12-28 (×2): 325 mg via ORAL

## 2012-12-27 MED ORDER — SUCCINYLCHOLINE CHLORIDE 20 MG/ML IJ SOLN
INTRAMUSCULAR | Status: DC | PRN
  Administered 2012-12-27: 100 mg via INTRAVENOUS

## 2012-12-27 MED ORDER — LIDOCAINE HCL (CARDIAC) 20 MG/ML IV SOLN
INTRAVENOUS | Status: DC | PRN
  Administered 2012-12-27: 75 mg via INTRAVENOUS

## 2012-12-27 MED ORDER — MENTHOL 3 MG MT LOZG: 1.0000 | LOZENGE | OROMUCOSAL | Status: DC | PRN

## 2012-12-27 MED ORDER — MIDAZOLAM HCL 5 MG/5ML IJ SOLN
INTRAMUSCULAR | Status: DC | PRN
  Administered 2012-12-27: 2 mg via INTRAVENOUS

## 2012-12-27 MED ORDER — HYDROMORPHONE HCL PF 1 MG/ML IJ SOLN: 0.5000 mg | INTRAMUSCULAR | Status: DC | PRN

## 2012-12-27 MED ORDER — ACETAMINOPHEN 650 MG RE SUPP: 650.0000 mg | Freq: Four times a day (QID) | RECTAL | Status: DC | PRN

## 2012-12-27 MED ORDER — POLYETHYLENE GLYCOL 3350 17 G PO PACK: 17.0000 g | PACK | Freq: Every day | ORAL | Status: DC | PRN

## 2012-12-27 MED ORDER — LACTATED RINGERS IV SOLN
INTRAVENOUS | Status: DC
  Administered 2012-12-27 (×4): via INTRAVENOUS

## 2012-12-27 MED ORDER — OXYCODONE HCL 5 MG PO TABS
5.0000 mg | ORAL_TABLET | ORAL | Status: DC | PRN
  Administered 2012-12-28 (×2): 10 mg via ORAL

## 2012-12-27 MED ORDER — PHENYLEPHRINE HCL 10 MG/ML IJ SOLN
INTRAMUSCULAR | Status: DC | PRN
  Administered 2012-12-27: 80 ug via INTRAVENOUS

## 2012-12-27 MED ORDER — PHENYLEPHRINE HCL 10 MG/ML IJ SOLN
10.0000 mg | INTRAVENOUS | Status: DC | PRN
  Administered 2012-12-27: 40 ug/min via INTRAVENOUS

## 2012-12-27 MED ORDER — PHENOL 1.4 % MT LIQD: 1.0000 | OROMUCOSAL | Status: DC | PRN

## 2012-12-27 MED ORDER — ACETAMINOPHEN 325 MG PO TABS: 650.0000 mg | ORAL_TABLET | Freq: Four times a day (QID) | ORAL | Status: DC | PRN

## 2012-12-27 MED ORDER — FENTANYL CITRATE 0.05 MG/ML IJ SOLN
INTRAMUSCULAR | Status: DC | PRN
  Administered 2012-12-27 (×3): 25 ug via INTRAVENOUS
  Administered 2012-12-27 (×3): 50 ug via INTRAVENOUS

## 2012-12-27 MED ORDER — DOCUSATE SODIUM 100 MG PO CAPS: 100.0000 mg | ORAL_CAPSULE | Freq: Two times a day (BID) | ORAL | Status: DC

## 2012-12-27 MED ORDER — BISACODYL 10 MG RE SUPP: 10.0000 mg | Freq: Every day | RECTAL | Status: DC | PRN

## 2012-12-27 MED ORDER — FENTANYL CITRATE 0.05 MG/ML IJ SOLN: 50.0000 ug | INTRAMUSCULAR | Status: DC | PRN

## 2012-12-27 MED ORDER — MORPHINE SULFATE 2 MG/ML IJ SOLN
2.0000 mg | INTRAMUSCULAR | Status: DC | PRN
  Administered 2012-12-27 – 2012-12-28 (×4): 2 mg via INTRAVENOUS

## 2012-12-27 MED ORDER — PREGABALIN 75 MG PO CAPS: 75.0000 mg | ORAL_CAPSULE | Freq: Two times a day (BID) | ORAL | Status: DC

## 2012-12-27 MED ORDER — CEFAZOLIN SODIUM-DEXTROSE 2-3 GM-% IV SOLR: 2.0000 g | INTRAVENOUS | Status: DC

## 2012-12-27 MED ORDER — BUPIVACAINE-EPINEPHRINE PF 0.5-1:200000 % IJ SOLN
INTRAMUSCULAR | Status: DC | PRN
  Administered 2012-12-27: 20 mL

## 2012-12-27 MED ORDER — METOCLOPRAMIDE HCL 5 MG/ML IJ SOLN: 5.0000 mg | Freq: Three times a day (TID) | INTRAMUSCULAR | Status: DC | PRN

## 2012-12-27 SURGICAL SUPPLY — 72 items
BENZOIN TINCTURE PRP APPL 2/3 (GAUZE/BANDAGES/DRESSINGS) IMPLANT
BIT DRILL 3X200 (DRILL) ×2 IMPLANT
BLADE SURG 10 STRL SS (BLADE) ×2 IMPLANT
BLADE SURG 15 STRL LF DISP TIS (BLADE) ×1 IMPLANT
BLADE SURG 15 STRL SS (BLADE) ×1
CANISTER SUCTION 2500CC (MISCELLANEOUS) ×2 IMPLANT
CHLORAPREP W/TINT 26ML (MISCELLANEOUS) ×2 IMPLANT
CLEANER CAUTERY TIP 5X5 PAD (MISCELLANEOUS) ×1 IMPLANT
CLOTH BEACON ORANGE TIMEOUT ST (SAFETY) ×2 IMPLANT
DRAPE C-ARM 42X72 X-RAY (DRAPES) ×2 IMPLANT
DRAPE INCISE IOBAN 66X45 STRL (DRAPES) ×2 IMPLANT
DRAPE SURG 17X23 STRL (DRAPES) ×2 IMPLANT
DRAPE U 20/CS (DRAPES) ×2 IMPLANT
DRAPE U-SHAPE 47X51 STRL (DRAPES) ×2 IMPLANT
DRAPE U-SHAPE 76X120 STRL (DRAPES) ×4 IMPLANT
DRSG ADAPTIC 3X8 NADH LF (GAUZE/BANDAGES/DRESSINGS) IMPLANT
DRSG PAD ABDOMINAL 8X10 ST (GAUZE/BANDAGES/DRESSINGS) ×2 IMPLANT
ELECT BLADE 6.5 .24CM SHAFT (ELECTRODE) IMPLANT
ELECT REM PT RETURN 9FT ADLT (ELECTROSURGICAL) ×2
ELECTRODE REM PT RTRN 9FT ADLT (ELECTROSURGICAL) ×1 IMPLANT
GAUZE SPONGE 4X4 16PLY XRAY LF (GAUZE/BANDAGES/DRESSINGS) IMPLANT
GLOVE BIO SURGEON STRL SZ 6.5 (GLOVE) ×2 IMPLANT
GLOVE BIO SURGEON STRL SZ7 (GLOVE) ×2 IMPLANT
GLOVE BIO SURGEON STRL SZ7.5 (GLOVE) ×2 IMPLANT
GLOVE BIOGEL PI IND STRL 7.0 (GLOVE) ×2 IMPLANT
GLOVE BIOGEL PI IND STRL 8 (GLOVE) ×1 IMPLANT
GLOVE BIOGEL PI INDICATOR 7.0 (GLOVE) ×2
GLOVE BIOGEL PI INDICATOR 8 (GLOVE) ×1
GOWN PREVENTION PLUS XLARGE (GOWN DISPOSABLE) ×6 IMPLANT
NAIL HUMERAL LEFT 8MM (Nail) ×2 IMPLANT
NDL SUT 6 .5 CRC .975X.05 MAYO (NEEDLE) IMPLANT
NEEDLE MAYO TAPER (NEEDLE)
NS IRRIG 1000ML POUR BTL (IV SOLUTION) ×2 IMPLANT
PACK ARTHROSCOPY DSU (CUSTOM PROCEDURE TRAY) ×2 IMPLANT
PACK BASIN DAY SURGERY FS (CUSTOM PROCEDURE TRAY) ×2 IMPLANT
PAD CLEANER CAUTERY TIP 5X5 (MISCELLANEOUS) ×1
PENCIL BUTTON HOLSTER BLD 10FT (ELECTRODE) ×2 IMPLANT
SCREW DISTAL 4.3X24MM (Screw) ×2 IMPLANT
SCREW PROXIMAL 5X36MM (Screw) ×6 IMPLANT
SCREW PROXIMAL CANN 5X40MM (Screw) ×2 IMPLANT
SHEET MEDIUM DRAPE 40X70 STRL (DRAPES) IMPLANT
SLEEVE SCD COMPRESS KNEE MED (MISCELLANEOUS) ×2 IMPLANT
SLING ARM FOAM STRAP LRG (SOFTGOODS) ×2 IMPLANT
SLING ARM FOAM STRAP MED (SOFTGOODS) IMPLANT
SLING ARM IMMOBILIZER MED (SOFTGOODS) IMPLANT
SPONGE GAUZE 4X4 12PLY (GAUZE/BANDAGES/DRESSINGS) ×2 IMPLANT
SPONGE LAP 18X18 X RAY DECT (DISPOSABLE) ×2 IMPLANT
SPONGE LAP 4X18 X RAY DECT (DISPOSABLE) ×2 IMPLANT
STRIP CLOSURE SKIN 1/2X4 (GAUZE/BANDAGES/DRESSINGS) IMPLANT
SUCTION FRAZIER TIP 10 FR DISP (SUCTIONS) ×2 IMPLANT
SUPPORT WRAP ARM LG (MISCELLANEOUS) ×2 IMPLANT
SUT ETHILON 4 0 PS 2 18 (SUTURE) IMPLANT
SUT FIBERWIRE #2 38 T-5 BLUE (SUTURE)
SUT MNCRL AB 3-0 PS2 18 (SUTURE) ×2 IMPLANT
SUT MNCRL AB 4-0 PS2 18 (SUTURE) ×2 IMPLANT
SUT PDS 2 CP NEEDLE XSPECIAL (SUTURE) IMPLANT
SUT PDS AB 0 CT 36 (SUTURE) ×2 IMPLANT
SUT PROLENE 3 0 PS 2 (SUTURE) IMPLANT
SUT TICRON 1 T 12 (SUTURE) IMPLANT
SUT VIC AB 0 CT1 18XCR BRD 8 (SUTURE) IMPLANT
SUT VIC AB 0 CT1 27 (SUTURE)
SUT VIC AB 0 CT1 27XBRD ANBCTR (SUTURE) IMPLANT
SUT VIC AB 0 CT1 8-18 (SUTURE)
SUT VIC AB 2-0 CT1 27 (SUTURE) ×1
SUT VIC AB 2-0 CT1 TAPERPNT 27 (SUTURE) ×1 IMPLANT
SUT VIC AB 2-0 SH 18 (SUTURE) ×2 IMPLANT
SUTURE FIBERWR #2 38 T-5 BLUE (SUTURE) IMPLANT
SYR BULB 3OZ (MISCELLANEOUS) ×2 IMPLANT
TOWEL OR 17X24 6PK STRL BLUE (TOWEL DISPOSABLE) ×4 IMPLANT
TOWEL OR NON WOVEN STRL DISP B (DISPOSABLE) ×2 IMPLANT
WIRE GUIDE MODEL 22X500MM (WIRE) ×2 IMPLANT
YANKAUER SUCT BULB TIP NO VENT (SUCTIONS) ×2 IMPLANT

## 2012-12-27 NOTE — Progress Notes (Signed)
Pt has lymphedema Right arm Surgery posted for left humerus fracture repair with intramedullary nail placement Dr Ivin Booty consulted about possible IV placement He decided to do EJ placement due to pt obese status as opposed to possible foot piv Pt also diabetic EJ placement obtained on left after unsuccessful attempt on the right Portable Chest Xray done to confirm placement Dressing CD&I Pt to surgery 1350PM

## 2012-12-27 NOTE — H&P (Signed)
Christy Harrell is an 72 y.o. female.   Chief Complaint: L shoulder pain HPI: L proximal humerus pathologic fracture presumed to be likely metastatic breast CA.  Past Medical History  Diagnosis Date  . Cancer     breast  . Club foot   . Arthritis   . Thyroid disease   . Hyperlipidemia   . Diabetes mellitus   . Osteoporosis   . Hypertension   . GERD (gastroesophageal reflux disease)     watches diet  . Neuromuscular disorder     lt arm numb sometimes  . Wears glasses     Past Surgical History  Procedure Laterality Date  . Cesarean section    . Thyroidectomy  2002  . Foot surgery      as a child for club foot  . Breast surgery  2012    rt lump-16 nodes-in [place]  . Cesarean section    . Colonoscopy    . Dilation and curettage of uterus      Family History  Problem Relation Age of Onset  . Cancer Neg Hx   . Arthritis Neg Hx   . Heart disease Neg Hx   . Hyperlipidemia Neg Hx   . Hypertension Neg Hx   . Dementia Mother   . Hearing loss Mother    Social History:  reports that she quit smoking about 38 years ago. She has never used smokeless tobacco. She reports that she does not drink alcohol or use illicit drugs.  Allergies: No Known Allergies  Medications Prior to Admission  Medication Sig Dispense Refill  . anastrozole (ARIMIDEX) 1 MG tablet Take 1 mg by mouth daily.      . Calcium Carbonate-Vitamin D (CALCIUM 600 + D PO) Take 1 tablet by mouth.      . diclofenac sodium (VOLTAREN) 1 % GEL Apply 2 g topically 4 (four) times daily.  2 Tube  2  . fentaNYL (DURAGESIC - DOSED MCG/HR) 25 MCG/HR Place 1 patch onto the skin every 3 (three) days.      . furosemide (LASIX) 40 MG tablet Take 40 mg by mouth daily.      Marland Kitchen HYDROcodone-acetaminophen (NORCO/VICODIN) 5-325 MG per tablet Take 1 tablet by mouth every 6 (six) hours as needed.  75 tablet  5  . levothyroxine (SYNTHROID, LEVOTHROID) 125 MCG tablet Take 1 tablet (125 mcg total) by mouth daily.  90 tablet  1  . metFORMIN  (GLUCOPHAGE) 500 MG tablet TAKE 1 TABLET ONCE DAILY.  30 tablet  5  . oxyCODONE-acetaminophen (PERCOCET/ROXICET) 5-325 MG per tablet Take 2 tablets by mouth every 4 (four) hours as needed for pain.  15 tablet  0  . Polyethyl Glycol-Propyl Glycol (SYSTANE ULTRA OP) Place 1 drop into both eyes daily.      . pregabalin (LYRICA) 75 MG capsule Take 75 mg by mouth 2 (two) times daily. Not taking        No results found for this or any previous visit (from the past 48 hour(s)). No results found.  Review of Systems  All other systems reviewed and are negative.    Blood pressure 128/77, pulse 91, temperature 98.5 F (36.9 C), temperature source Oral, resp. rate 20, height 5' (1.524 m), weight 99.428 kg (219 lb 3.2 oz), SpO2 100.00%. Physical Exam  Constitutional: She is oriented to person, place, and time. She appears well-developed and well-nourished.  HENT:  Head: Atraumatic.  Eyes: EOM are normal.  Cardiovascular: Intact distal pulses.   Respiratory: Effort normal.  Musculoskeletal:       Left shoulder: She exhibits decreased range of motion, tenderness and pain.  Neurological: She is alert and oriented to person, place, and time.  Skin: Skin is warm and dry.  Psychiatric: She has a normal mood and affect.     Assessment/Plan L proximal humerus pathologic fracture Plan IMN L proximal humerus fracture with bone biopsy Risks / benefits of surgery discussed Consent on chart  NPO for OR Preop antibiotics   [name] 12/27/2012, 12:30 PM

## 2012-12-27 NOTE — Anesthesia Preprocedure Evaluation (Signed)
Anesthesia Evaluation  Patient identified by MRN, date of birth, ID band Patient awake    Reviewed: Allergy & Precautions, H&P , NPO status , Patient's Chart, lab work & pertinent test results  Airway Mallampati: I TM Distance: >3 FB Neck ROM: Full    Dental  (+) Teeth Intact, Dental Advisory Given and Missing   Pulmonary  breath sounds clear to auscultation        Cardiovascular hypertension, Pt. on medications Rhythm:Regular Rate:Normal     Neuro/Psych    GI/Hepatic   Endo/Other  diabetes, Well Controlled, Type 2, Oral Hypoglycemic AgentsMorbid obesity  Renal/GU      Musculoskeletal   Abdominal   Peds  Hematology   Anesthesia Other Findings   Reproductive/Obstetrics                           Anesthesia Physical Anesthesia Plan  ASA: III  Anesthesia Plan: General   Post-op Pain Management:    Induction: Intravenous  Airway Management Planned: Oral ETT  Additional Equipment:   Intra-op Plan:   Post-operative Plan: Extubation in OR  Informed Consent: I have reviewed the patients History and Physical, chart, labs and discussed the procedure including the risks, benefits and alternatives for the proposed anesthesia with the patient or authorized representative who has indicated his/her understanding and acceptance.   Dental advisory given  Plan Discussed with: CRNA, Anesthesiologist and Surgeon  Anesthesia Plan Comments:         Anesthesia Quick Evaluation

## 2012-12-27 NOTE — Transfer of Care (Signed)
Immediate Anesthesia Transfer of Care Note  Patient: Christy Harrell  Procedure(s) Performed: Procedure(s): INTRAMEDULLARY (IM) NAIL HUMERAL LEFT PATHOLOGIC FRACTURE (Left)  Patient Location: PACU  Anesthesia Type:General  Level of Consciousness: sedated and patient cooperative  Airway & Oxygen Therapy: Patient Spontanous Breathing and Patient connected to face mask oxygen  Post-op Assessment: Report given to PACU RN and Post -op Vital signs reviewed and stable  Post vital signs: Reviewed and stable  Complications: No apparent anesthesia complications

## 2012-12-27 NOTE — Op Note (Addendum)
Procedure(s): INTRAMEDULLARY (IM) NAIL HUMERAL LEFT PATHOLOGIC FRACTURE Procedure Note  Christy Harrell female 72 y.o. 12/27/2012  Procedure(s) and Anesthesia Type:  #1 intramedullary nail left pathologic proximal humerus fracture #2 left shoulder rotator cuff repair #3 left shoulder open bone biopsy  Postoperative diagnosis: #1 pathologic left proximal humerus fracture with lytic lesion #2 left shoulder rotator cuff tear  Surgeon(s) and Role:    * Mable Paris, MD - Primary   Indications:  72 y.o. female with a history of breast cancer treated 2 years ago. She had immediate pain in the left side approximately 2 weeks ago when pushing off. She was found to have a pathologic fracture left proximal humerus through a large lytic lesion. She was indicated for surgical stabilization of the fracture and open bone biopsy.     Surgeon: Mable Paris   Assistants: Damita Lack PA-C [facility] was present and scrubbed throughout the procedure and was essential in positioning, retraction, exposure, and closure)  Anesthesia: General endotracheal anesthesia    Procedure Detail  INTRAMEDULLARY (IM) NAIL HUMERAL LEFT PATHOLOGIC FRACTURE  Findings: Upon exposure she was noted to have a supraspinatus tear which was minimally retracted. The nail was inserted through the tear. The bone biopsies were taken which were grayish and soft tissue. The fracture was repaired using an intramedullary nail with 4 locking proximal screws and one distal nonlocking screw. The rotator cuff tear was then repaired at the conclusion of the procedure with one fiber tapes through bone tunnels.  Estimated Blood Loss:  less than 100 mL         Drains: none  Blood Given: none         Specimens: none        Complications:  * No complications entered in OR log *         Disposition: PACU - hemodynamically stable.         Condition: stable    Procedure:  DESCRIPTION OF PROCEDURE: The  patient was identified in preoperative  holding area where I personally marked the operative site after  verifying site, side, and procedure with the patient. The patient was taken back  to the operating room where general anesthesia was induced without  complication and was placed in the beach-chair position with the back  elevated about 60 degrees and all extremities carefully padded and  positioned. 2 g of Ancef were given within 30 minutes of incision.  Fluoroscopic imaging was obtained in grashey and outlet views to verify positioning.  The left upper extremity was prepped and draped in the standard sterile fashion. The appropriate timeout procedure was carried out.  A approximately 4 cm incision was made in Langer's lines just off the lateral edge of the acromion anteriorly. Dissection was carried down through the subcutaneous tissues to the level of the deltoid fascia which was split in line with the anterior acromion. The deltoid insertion was taken off the anterior acromion for about 1/2 cm and then split down the deltoid an additional 1/2 cm. This allowed exposure of the rotator cuff. She was noted to have a rotator cuff tear with minimal retraction. The rotator cuff was retracted medially and the entry awl from the nail set was used to create a entry hole in the superior humeral head using both x-ray images to verify appropriate positioning. Curettes and pituitary rongeur were then used to collect samples from the lytic lesion which were sent for pathology. The guidewire was then passed across the fracture site down the  canal and the nail was placed over the guidewire. All 4 screws were then sequentially placed proximally by spreading down and placing the trocar directly on bone. Fluoroscopic imaging was used throughout this to ensure appropriate screw length and placement. The distal interlocking screw was then placed under fluoroscopic guidance again spreading directly down to the bone to  prevent any injury to neurovascular structures. The jig was removed and final fluoroscopic imaging demonstrated appropriate positioning of the nail and screws. Attention was then turned to the rotator cuff where a fiber tape was placed in an inverted mattress configuration and then passed through bone tunnels out the lateral proximal greater tuberosity with a sharp free needle and then tied over a bone bridge. The wound was copiously irrigated with normal saline and subsequently closed in layers with a #2 PDS reattaching the deltoid to the acromion and then reapproximating the deltoid split bilaterally. Skin was then closed in layers using 2-0 Vicryl in a deep dermal layer and 4-0 Monocryl for skin closure with Steri-Strips superficially. A light sterile dressing was then applied. The patient was allowed to awaken from general anesthesia transferred to stretcher and taken to recovery room in stable condition.  Postoperative plan:  We will see her she doesn't recovery room. If she is doing well like to go home we will discharge her home today. He needs to be observed overnight she will be kept overnight for pain control and observation. She will followup with me in 2 weeks for wound check and x-rays.

## 2012-12-28 ENCOUNTER — Encounter (HOSPITAL_BASED_OUTPATIENT_CLINIC_OR_DEPARTMENT_OTHER): Payer: Self-pay | Admitting: Orthopedic Surgery

## 2012-12-28 NOTE — Anesthesia Postprocedure Evaluation (Signed)
  Anesthesia Post-op Note  Patient: Christy Harrell  Procedure(s) Performed: Procedure(s): INTRAMEDULLARY (IM) NAIL HUMERAL LEFT PATHOLOGIC FRACTURE (Left)  Patient Location: PACU  Anesthesia Type:General  Level of Consciousness: awake, alert  and oriented  Airway and Oxygen Therapy: Patient Spontanous Breathing and Patient connected to face mask oxygen  Post-op Pain: mild  Post-op Assessment: Post-op Vital signs reviewed  Post-op Vital Signs: Reviewed  Complications: No apparent anesthesia complications

## 2012-12-28 NOTE — Progress Notes (Signed)
PATIENT ID: Christy Harrell   1 Day Post-Op Procedure(s) (LRB): INTRAMEDULLARY (IM) NAIL HUMERAL LEFT PATHOLOGIC FRACTURE (Left)  Subjective: Reports feeling "funny" this am from the morphine. Reports pain is finally well controlled. No other complaints or concerns. Ready to go home.   Objective:  Filed Vitals:   12/28/12 0500  BP: 145/75  Pulse: 84  Temp: 98.4 F (36.9 C)  Resp: 18     Awake, alert, orientated L UE dressing c/d/i Wiggles fingers, distally NVI  Labs:  No results found for this basename: HGB,  in the last 72 hoursNo results found for this basename: WBC, RBC, HCT, PLT,  in the last 72 hoursNo results found for this basename: NA, K, CL, CO2, BUN, CREATININE, GLUCOSE, CALCIUM,  in the last 72 hours  Assessment and Plan: Okay to d/c home this am Has Percocet 5/325 for home pain control left over from previous script, will add oxy 5mg  on top for breakthrough pain if needed Fu [address] Filter in 10 days  VTE proph: SCDs, ASA 325mg  BID

## 2013-01-04 ENCOUNTER — Telehealth: Payer: Self-pay | Admitting: Oncology

## 2013-01-04 ENCOUNTER — Other Ambulatory Visit: Payer: Self-pay | Admitting: Physician Assistant

## 2013-01-04 NOTE — Telephone Encounter (Signed)
, °

## 2013-01-13 ENCOUNTER — Encounter (HOSPITAL_COMMUNITY)
Admission: RE | Admit: 2013-01-13 | Discharge: 2013-01-13 | Disposition: A | Discharge status: Home / Self Care | Payer: Medicare Other | Source: Ambulatory Visit | Attending: Physician Assistant | Admitting: Physician Assistant

## 2013-01-13 ENCOUNTER — Encounter (HOSPITAL_COMMUNITY): Payer: Self-pay

## 2013-01-13 DIAGNOSIS — M899 Disorder of bone, unspecified: Secondary | ICD-10-CM | POA: Insufficient documentation

## 2013-01-13 DIAGNOSIS — C50911 Malignant neoplasm of unspecified site of right female breast: Secondary | ICD-10-CM

## 2013-01-13 DIAGNOSIS — C50919 Malignant neoplasm of unspecified site of unspecified female breast: Secondary | ICD-10-CM | POA: Insufficient documentation

## 2013-01-13 LAB — GLUCOSE, CAPILLARY: Glucose-Capillary: 106 mg/dL — ABNORMAL HIGH (ref 70–99)

## 2013-01-13 MED ORDER — FLUDEOXYGLUCOSE F - 18 (FDG) INJECTION
18.2000 | Freq: Once | INTRAVENOUS | Status: AC | PRN
  Administered 2013-01-13: 18.2 via INTRAVENOUS

## 2013-01-16 ENCOUNTER — Encounter (HOSPITAL_COMMUNITY): Payer: Medicare Other

## 2013-01-17 ENCOUNTER — Telehealth: Payer: Self-pay | Admitting: Oncology

## 2013-01-17 ENCOUNTER — Ambulatory Visit (HOSPITAL_BASED_OUTPATIENT_CLINIC_OR_DEPARTMENT_OTHER): Payer: Medicare Other | Admitting: Oncology

## 2013-01-17 ENCOUNTER — Other Ambulatory Visit (HOSPITAL_BASED_OUTPATIENT_CLINIC_OR_DEPARTMENT_OTHER): Payer: Medicare Other | Admitting: Lab

## 2013-01-17 VITALS — BP 127/73 | HR 67 | Temp 98.8°F | Resp 20 | Ht 60.0 in | Wt 217.1 lb

## 2013-01-17 DIAGNOSIS — M899 Disorder of bone, unspecified: Secondary | ICD-10-CM

## 2013-01-17 DIAGNOSIS — E559 Vitamin D deficiency, unspecified: Secondary | ICD-10-CM

## 2013-01-17 DIAGNOSIS — C7951 Secondary malignant neoplasm of bone: Secondary | ICD-10-CM

## 2013-01-17 DIAGNOSIS — C50919 Malignant neoplasm of unspecified site of unspecified female breast: Secondary | ICD-10-CM

## 2013-01-17 DIAGNOSIS — C7952 Secondary malignant neoplasm of bone marrow: Secondary | ICD-10-CM

## 2013-01-17 DIAGNOSIS — C50911 Malignant neoplasm of unspecified site of right female breast: Secondary | ICD-10-CM

## 2013-01-17 LAB — COMPREHENSIVE METABOLIC PANEL (CC13)
AST: 13 U/L (ref 5–34)
Albumin: 3.1 g/dL — ABNORMAL LOW (ref 3.5–5.0)
BUN: 13.6 mg/dL (ref 7.0–26.0)
CO2: 26 mEq/L (ref 22–29)
Calcium: 9.6 mg/dL (ref 8.4–10.4)
Chloride: 106 mEq/L (ref 98–107)
Creatinine: 0.8 mg/dL (ref 0.6–1.1)
Glucose: 79 mg/dl (ref 70–99)
Potassium: 4.1 mEq/L (ref 3.5–5.1)

## 2013-01-17 LAB — CBC WITH DIFFERENTIAL/PLATELET
BASO%: 1 % (ref 0.0–2.0)
Basophils Absolute: 0.1 10*3/uL (ref 0.0–0.1)
EOS%: 1.1 % (ref 0.0–7.0)
HCT: 31.6 % — ABNORMAL LOW (ref 34.8–46.6)
HGB: 10.6 g/dL — ABNORMAL LOW (ref 11.6–15.9)
LYMPH%: 34.8 % (ref 14.0–49.7)
MCH: 29.2 pg (ref 25.1–34.0)
MCHC: 33.7 g/dL (ref 31.5–36.0)
MCV: 86.6 fL (ref 79.5–101.0)
MONO%: 8.3 % (ref 0.0–14.0)
NEUT%: 54.8 % (ref 38.4–76.8)
lymph#: 2.4 10*3/uL (ref 0.9–3.3)

## 2013-01-17 NOTE — Progress Notes (Signed)
ID: Etter Sjogren   DOB: [birth year]  MR#: [id number]  WUJ#:[id number]  PCP: Sanda Linger, MD GYN:  SU:  OTHER MD:  Claudette Laws, Gean Birchwood, Berline Lopes   HISTORY OF PRESENT ILLNESS: The patient had routine screening mammography 10/16/2010 showing a lobulated mass in the right subareolar region measuring up to 5.5 cm. The Left breast showed some central microcalcifications. Biopsy of the Right breast mass on 10/28/2010 showed an invasive ductal carcinoma, grade 2, estrogen receptor positive (Allred score 8), progesterone receptor positive (Allred score 5), with an equivocal HER-2. The Left breast area of microcalcifications was biopsied at the same time, and was read as suspicious for DCIS.  On 11/17/2010 the patient underwent Right lumpectomy and axillary lymph node dissection for what proved to be a 3 cm invasive ductal carcinoma, grade 2, with some papillary and mucinous features. One of 16 lymph nodes was involved. FISH showed no HER-2 amplification. Left breast biopsy was benign.  The patient had an Oncotype sent, with a score of 27, predicting a risk of distant recurrence after 5 years of tamoxifen in the 18% range. With this intermediate result, the decision was made not to proceed with chemotherapy, since the patient is the sole caregiver to her very elderly mother. Instead the patient proceeded to radiation treatment which was completed 03/06/2011. She started anastrozole at that point. Her subsequent history is as detailed below   INTERVAL HISTORY: Guneet returns today for followup of her right breast cancer. Since her last visit here she had definitive surgery of her left humeral fracture. Unfortunately the pathology does show estrogen receptor positive breast cancer. We just obtain a PET scan to restage her. This shows no other site of gross disease.  REVIEW OF SYSTEMS: She still having significant pain from the left arm surgery. She wanted [place] to refill her  oxycodone today which I was glad to do (30 tablets, no refills). She also has pain in the lower back and the right hip, which are chronic. She continues to have problems with hot flashes, and worries about her diabetes and thyroid issues. She went to the dentist about 2 months ago. No extractions are planned. A detailed review of systems today was otherwise stable.  PAST MEDICAL HISTORY: Past Medical History  Diagnosis Date  . Cancer     breast  . Club foot   . Arthritis   . Thyroid disease   . Hyperlipidemia   . Diabetes mellitus   . Osteoporosis   . Hypertension   . GERD (gastroesophageal reflux disease)     watches diet  . Neuromuscular disorder     lt arm numb sometimes  . Wears glasses     PAST SURGICAL HISTORY: Past Surgical History  Procedure Laterality Date  . Cesarean section    . Thyroidectomy  2002  . Foot surgery      as a child for club foot  . Breast surgery  2012    rt lump-16 nodes-in [place]  . Cesarean section    . Colonoscopy    . Dilation and curettage of uterus    . Humerus im nail Left 12/27/2012    Procedure: INTRAMEDULLARY (IM) NAIL HUMERAL LEFT PATHOLOGIC FRACTURE;  Surgeon: Mable Paris, MD;  Location: [facility];  Service: Orthopedics;  Laterality: Left;    FAMILY HISTORY Family History  Problem Relation Age of Onset  . Cancer Neg Hx   . Arthritis Neg Hx   . Heart disease Neg Hx   .  Hyperlipidemia Neg Hx   . Hypertension Neg Hx   . Dementia Mother   . Hearing loss Mother    The patient's father died from unknown causes. The patient's mother is alive at age 29. The patient was a single child. There is no history of breast or ovarian cancer in the family to her knowledge.  GYNECOLOGIC HISTORY: Menarche age 52, menopause in her early 41s. The patient is GX P1, first pregnancy to term age 33. She did not take hormone replacement  SOCIAL HISTORY: The patient grew up in [place], attended [employer], then moved to  [place] where she lived about 40 years. 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 Her mother, [name], 31, lives with the patient. The patient's daughter [name], 58, is disabled secondary to pulmonary hypertension. She can be reached at [phone]. The patient has 2 grandchildren, [name] who works at a Human resources officer business and is 72 years old, in general, 31, completing high school.   ADVANCED DIRECTIVES:  HEALTH MAINTENANCE: History  Substance Use Topics  . Smoking status: Former Smoker    Quit date: 12/23/1974  . Smokeless tobacco: Never Used  . Alcohol Use: No     Colonoscopy:  PAP:    Bone density:  2012, on ibandronate chronically  Lipid panel: UTD  No Known Allergies  Current Outpatient Prescriptions  Medication Sig Dispense Refill  . anastrozole (ARIMIDEX) 1 MG tablet Take 1 mg by mouth daily.      . Calcium Carbonate-Vitamin D (CALCIUM 600 + D PO) Take 1 tablet by mouth.      . diclofenac sodium (VOLTAREN) 1 % GEL Apply 2 g topically 4 (four) times daily.  2 Tube  2  . fentaNYL (DURAGESIC - DOSED MCG/HR) 25 MCG/HR Place 1 patch onto the skin every 3 (three) days.      . furosemide (LASIX) 40 MG tablet Take 40 mg by mouth daily.      Marland Kitchen levothyroxine (SYNTHROID, LEVOTHROID) 125 MCG tablet Take 1 tablet (125 mcg total) by mouth daily.  90 tablet  1  . metFORMIN (GLUCOPHAGE) 500 MG tablet TAKE 1 TABLET ONCE DAILY.  30 tablet  5  . oxyCODONE (ROXICODONE) 5 MG immediate release tablet 1-2 every 4-6 hours prn pain  60 tablet  0  . oxyCODONE-acetaminophen (PERCOCET/ROXICET) 5-325 MG per tablet Take 2 tablets by mouth every 4 (four) hours as needed for pain.  15 tablet  0  . Polyethyl Glycol-Propyl Glycol (SYSTANE ULTRA OP) Place 1 drop into both eyes daily.      . pregabalin (LYRICA) 75 MG capsule Take 75 mg by mouth 2 (two) times daily. Not taking       No current facility-administered medications for this visit.       Body mass  index is 42.4 kg/(m^2).    ECOG FS: 2 Filed Weights   01/17/13 1357  Weight: 217 lb 1.6 oz (98.476 kg)   OBJECTIVE: Elderly African American woman who appears anxious Sclerae unicteric Oropharynx clear No cervical or supraclavicular adenopathy Lungs no rales or rhonchi Heart regular rate and rhythm Abd obese, benign MSK chronic lymphedema right upper extremity; left arm is in a sling; the left shoulder incisions have healed very nicely. There is no erythema or unusual tenderness Neuro: nonfocal, well oriented, anxious affect Breasts: Deferred   LAB RESULTS: Lab Results  Component Value Date   WBC 7.0 01/17/2013   NEUTROABS 3.8 01/17/2013   HGB 10.6*  01/17/2013   HCT 31.6* 01/17/2013   MCV 86.6 01/17/2013   PLT 409* 01/17/2013      Chemistry      Component Value Date/Time   NA 141 01/17/2013 1328   NA 140 12/23/2012 1100   K 4.1 01/17/2013 1328   K 4.5 12/23/2012 1100   CL 106 01/17/2013 1328   CL 104 12/23/2012 1100   CO2 26 01/17/2013 1328   CO2 28 12/23/2012 1100   BUN 13.6 01/17/2013 1328   BUN 13 12/23/2012 1100   CREATININE 0.8 01/17/2013 1328   CREATININE 0.82 12/23/2012 1100      Component Value Date/Time   CALCIUM 9.6 01/17/2013 1328   CALCIUM 9.7 12/23/2012 1100   ALKPHOS 92 01/17/2013 1328   ALKPHOS 75 07/27/2012 1401   AST 13 01/17/2013 1328   AST 17 07/27/2012 1401   ALT 7 01/17/2013 1328   ALT 14 07/27/2012 1401   BILITOT 0.38 01/17/2013 1328   BILITOT 0.4 07/27/2012 1401       Lab Results  Component Value Date   LABCA2 24 11/19/2011    STUDIES: Nm Pet Image Restag (ps) Skull Base To Thigh  01/13/2013   *RADIOLOGY REPORT*  Clinical Data: Subsequent treatment strategy for breast cancer. Bone lesion left shoulder.  NUCLEAR MEDICINE PET SKULL BASE TO THIGH  Fasting Blood Glucose:  106  Technique:  18.2 mCi F-18 FDG was injected intravenously. CT data was obtained and used for attenuation correction and anatomic localization only.  (This was not acquired as a  diagnostic CT examination.) Additional exam technical data entered on technologist worksheet.  Comparison:  None.  Findings:  Neck: No hypermetabolic lymph nodes in the neck.  Chest:  No hypermetabolic mediastinal or hilar nodes.  No suspicious pulmonary nodules on the CT scan. There are surgical and radiation changes involving the right breast. Diffuse increased uptake in the skin overlying the breast is probably due to radiation change. No obvious breast masses.  Abdomen/Pelvis:  No abnormal hypermetabolic activity within the liver, pancreas, adrenal glands, or spleen.  No hypermetabolic lymph nodes in the abdomen or pelvis.  Skeleton:  Diffuse increased uptake around the left shoulder consistent with recent surgery.  I do not see any definite bone metastasis otherwise.  IMPRESSION:  1.  Uptake around the right shoulder consistent with recent surgery. 2.  No findings to suggest other areas of osseous metastatic disease. 3.  Surgical and radiation changes involving the right breast but no findings suspicious for recurrent tumor or adenopathy.   Original Report Authenticated By: Rudie Meyer, M.D.   Dg Chest Port 1 View  12/27/2012   *RADIOLOGY REPORT*  Clinical Data: Evaluate central line placement.  PORTABLE CHEST - 1 VIEW  Comparison: No priors.  Findings: There is a small bore catheter projecting over the lower left cervical region.  The catheter takes a medial direction above the level of the cervicothoracic junction and extends toward the midline.  The tip of the catheter is not confidently identified below the level of the medial left clavicle head.  Multiple surgical clips are noted projecting over the thoracic inlet, likely from prior thyroid surgery.  No pneumothorax.  Lung volumes are normal.  No acute consolidative airspace disease.  No pleural effusions.  Mild pulmonary venous congestion without frank pulmonary edema.  Mild cardiomegaly.  The patient is rotated to the right on today's exam, resulting  in distortion of the mediastinal contours and reduced diagnostic sensitivity and specificity for mediastinal pathology.  Large lytic  lesion in the left humeral neck with associated pathologic fracture noted.  IMPRESSION: 1.  Small bore catheter in the left cervical region, as above.  The tip of the catheter cannot be confidently localize, however, this is suspected to reside immediately above the level thoracic inlet just to the left of midline. 2.  Cardiomegaly with mild pulmonary venous congestion, but no frank pulmonary edema. 3.  Pathologic fracture through a large lytic lesion in the left humeral neck.   Original Report Authenticated By: Trudie Reed, M.D.   Dg Humerus Left  12/27/2012   *RADIOLOGY REPORT*  Clinical Data: Pathologic fracture.  DG C-ARM 61-120 MIN,LEFT HUMERUS - 2+ VIEW  Technique:  C-arm fluoroscopic images were obtained intraoperatively and submitted for postoperative interpretation. Please see the performing provider's procedural report for the fluoroscopy time utilized.  Comparison: None.  Findings: Pathologic fracture has been treated with intramedullary nail.  Satisfactory position and alignment.  IMPRESSION: As above.   Original Report Authenticated By: Davonna Belling, M.D.   Dg C-arm [phone] Min  12/27/2012   *RADIOLOGY REPORT*  Clinical Data: Pathologic fracture.  DG C-ARM 61-120 MIN,LEFT HUMERUS - 2+ VIEW  Technique:  C-arm fluoroscopic images were obtained intraoperatively and submitted for postoperative interpretation. Please see the performing provider's procedural report for the fluoroscopy time utilized.  Comparison: None.  Findings: Pathologic fracture has been treated with intramedullary nail.  Satisfactory position and alignment.  IMPRESSION: As above.   Original Report Authenticated By: Davonna Belling, M.D.   ASSESSMENT: 72 y.o.  [place] woman   (1)  status post right lumpectomy and axillary lymph node dissection 11/17/2010 for a pT2 pN1, stage IIB invasive ductal  carcinoma, grade 2, estrogen and progesterone receptor positive, HER-2 negative,   (2) Oncotype DX recurrence score of 27 predicting a risk of distant recurrence of 18% with 5 years of tamoxifen (intermediate score);   (3)  status post radiation completed July of 2012,   (4) started anastrozole July 2012, discontinued June 2014 with evidence of metastatic spread to bone  (5) Chronic lymphedema in the right upper extremity.  (6) pathologic fracture of the left humerus along apparent lytic lesion, with no other bone lesions per bone scan 12/12/2012  (7) on 12/27/2012 underwent #1 intramedullary nail left pathologic proximal humerus fracture  #2 left shoulder rotator cuff repair  #3 left shoulder open bone biopsy with pathology showing metastatic breast adenocarcinoma, estrogen receptor 100% positive, progesterone receptor and HER-2 negative, with an MIB-1 of 26%.  (8) fulvestrant to start 01/23/2013  (9) zolendronic acid to start 01/23/2013  PLAN:  Abegail now has documented stage IV breast cancer, metastatic to bone. However there are no active lesions other than the left humerus itself. Accordingly I think radiation to that area may be prudent. I am referring her to radiation oncology for discussion of that option.  The anastrozole clearly did not hold the tumor in check, and we are stopping it. We are starting fulvestrant, together with zolendronate. We discussed the possible toxicities, side effects, and complications of these medications. She will start her treatments on June 16, and we will check see how she is tolerating them approximately a month later. The plan is to continue this for 4 months then repeat a chest x-ray and bone scan.   Tessalon he has a good understanding of this plan. She knows to call for any problems that may develop before her next visit here.  MAGRINAT,GUSTAV C    01/17/2013

## 2013-01-18 ENCOUNTER — Telehealth: Payer: Self-pay | Admitting: Oncology

## 2013-01-18 ENCOUNTER — Telehealth: Payer: Self-pay | Admitting: *Deleted

## 2013-01-18 ENCOUNTER — Other Ambulatory Visit: Payer: Medicare Other

## 2013-01-18 ENCOUNTER — Ambulatory Visit: Payer: Medicare Other | Admitting: Physical Therapy

## 2013-01-18 NOTE — Telephone Encounter (Signed)
Per staff phone call and POF I have schedueld appts.  JMW  

## 2013-01-18 NOTE — Telephone Encounter (Signed)
, °

## 2013-01-19 LAB — SPEP & IFE WITH QIG
Albumin ELP: 50.7 % — ABNORMAL LOW (ref 55.8–66.1)
Alpha-1-Globulin: 5.4 % — ABNORMAL HIGH (ref 2.9–4.9)
Alpha-2-Globulin: 13.1 % — ABNORMAL HIGH (ref 7.1–11.8)
Beta 2: 7.5 % — ABNORMAL HIGH (ref 3.2–6.5)
Beta Globulin: 6.7 % (ref 4.7–7.2)
IgA: 410 mg/dL — ABNORMAL HIGH (ref 69–380)
Total Protein, Serum Electrophoresis: 6.9 g/dL (ref 6.0–8.3)

## 2013-01-20 ENCOUNTER — Other Ambulatory Visit: Payer: Self-pay | Admitting: Physician Assistant

## 2013-01-20 DIAGNOSIS — C7951 Secondary malignant neoplasm of bone: Secondary | ICD-10-CM

## 2013-01-20 DIAGNOSIS — C50912 Malignant neoplasm of unspecified site of left female breast: Secondary | ICD-10-CM

## 2013-01-23 ENCOUNTER — Ambulatory Visit (HOSPITAL_BASED_OUTPATIENT_CLINIC_OR_DEPARTMENT_OTHER): Payer: Medicare Other

## 2013-01-23 ENCOUNTER — Ambulatory Visit: Payer: Medicare Other

## 2013-01-23 ENCOUNTER — Other Ambulatory Visit: Payer: Self-pay | Admitting: *Deleted

## 2013-01-23 ENCOUNTER — Other Ambulatory Visit (HOSPITAL_BASED_OUTPATIENT_CLINIC_OR_DEPARTMENT_OTHER): Payer: Medicare Other | Admitting: Lab

## 2013-01-23 VITALS — BP 109/61 | HR 63 | Temp 98.2°F

## 2013-01-23 DIAGNOSIS — C7951 Secondary malignant neoplasm of bone: Secondary | ICD-10-CM

## 2013-01-23 DIAGNOSIS — C50919 Malignant neoplasm of unspecified site of unspecified female breast: Secondary | ICD-10-CM

## 2013-01-23 DIAGNOSIS — Z5111 Encounter for antineoplastic chemotherapy: Secondary | ICD-10-CM

## 2013-01-23 DIAGNOSIS — C50911 Malignant neoplasm of unspecified site of right female breast: Secondary | ICD-10-CM

## 2013-01-23 DIAGNOSIS — M899 Disorder of bone, unspecified: Secondary | ICD-10-CM

## 2013-01-23 LAB — CBC WITH DIFFERENTIAL/PLATELET
BASO%: 0.5 % (ref 0.0–2.0)
Basophils Absolute: 0 10*3/uL (ref 0.0–0.1)
EOS%: 2.7 % (ref 0.0–7.0)
HCT: 33.9 % — ABNORMAL LOW (ref 34.8–46.6)
HGB: 10.7 g/dL — ABNORMAL LOW (ref 11.6–15.9)
MCH: 28.2 pg (ref 25.1–34.0)
MONO#: 0.7 10*3/uL (ref 0.1–0.9)
RDW: 14.8 % — ABNORMAL HIGH (ref 11.2–14.5)
WBC: 7.4 10*3/uL (ref 3.9–10.3)
lymph#: 2.8 10*3/uL (ref 0.9–3.3)

## 2013-01-23 LAB — COMPREHENSIVE METABOLIC PANEL (CC13)
Alkaline Phosphatase: 105 U/L (ref 40–150)
BUN: 8.8 mg/dL (ref 7.0–26.0)
Glucose: 91 mg/dl (ref 70–99)
Sodium: 141 mEq/L (ref 136–145)
Total Bilirubin: 0.3 mg/dL (ref 0.20–1.20)
Total Protein: 7.3 g/dL (ref 6.4–8.3)

## 2013-01-23 MED ORDER — FULVESTRANT 250 MG/5ML IM SOLN
500.0000 mg | INTRAMUSCULAR | Status: DC
  Administered 2013-01-23: 500 mg via INTRAMUSCULAR
  Filled 2013-01-23: qty 10

## 2013-01-23 MED ORDER — ZOLEDRONIC ACID 4 MG/100ML IV SOLN
4.0000 mg | Freq: Once | INTRAVENOUS | Status: AC
  Administered 2013-01-23: 4 mg via INTRAVENOUS
  Filled 2013-01-23: qty 100

## 2013-01-23 NOTE — Patient Instructions (Addendum)
[facility] Discharge Instructions for Patients Receiving Chemotherapy  Today you received the following chemotherapy agents Zometa/Faslodex.  To help prevent nausea and vomiting after your treatment, we encourage you to take your nausea medication as prescribed.   If you develop nausea and vomiting that is not controlled by your nausea medication, call the clinic.   BELOW ARE SYMPTOMS THAT SHOULD BE REPORTED IMMEDIATELY:  *FEVER GREATER THAN 100.5 F  *CHILLS WITH OR WITHOUT FEVER  NAUSEA AND VOMITING THAT IS NOT CONTROLLED WITH YOUR NAUSEA MEDICATION  *UNUSUAL SHORTNESS OF BREATH  *UNUSUAL BRUISING OR BLEEDING  TENDERNESS IN MOUTH AND THROAT WITH OR WITHOUT PRESENCE OF ULCERS  *URINARY PROBLEMS  *BOWEL PROBLEMS  UNUSUAL RASH Items with * indicate a potential emergency and should be followed up as soon as possible.  Feel free to call the clinic you have any questions or concerns. The clinic phone number is [phone].

## 2013-01-23 NOTE — Progress Notes (Signed)
Faslodex injections given in the infusion room after receiving treatment.

## 2013-01-26 ENCOUNTER — Ambulatory Visit: Payer: Medicare Other | Attending: Orthopedic Surgery | Admitting: Physical Therapy

## 2013-01-26 DIAGNOSIS — C50919 Malignant neoplasm of unspecified site of unspecified female breast: Secondary | ICD-10-CM | POA: Insufficient documentation

## 2013-01-26 DIAGNOSIS — I890 Lymphedema, not elsewhere classified: Secondary | ICD-10-CM | POA: Insufficient documentation

## 2013-01-26 DIAGNOSIS — IMO0001 Reserved for inherently not codable concepts without codable children: Secondary | ICD-10-CM | POA: Insufficient documentation

## 2013-01-30 NOTE — Progress Notes (Signed)
Histology and Location of Primary Cancer: metastatic   Breast to bone 12/27/12 Diagnosis Bone, biopsy, Left proximal humerus - METASTATIC ADENOCARCINOMA.  Sites of Visceral and Bony Metastatic Disease: Left  Proximal Humerus Location(s) of Symptomatic Metastases:left humerus, pathologic fracture left humeral head injury  11/2012, Past/Anticipated chemotherapy by medical oncology, if any: fulvestrant to start 01/23/2013  (9) zolendronic acid to start 01/23/2013  - last chemotherpay was 01/26/2013 RAdiation Treatment Right breast completed 03/06/2011 [facility] [facility]. Pain on a scale of 0-10 is:  6  ICurrent Decadron regimen, if applicable: NO Ambulatory status? Walker? Wheelchair?: no SAFETY ISSUES:  Prior radiation?YES ,RAdiation Treatment Right breast completed 03/06/2011 [facility]  Pacemaker/ICD? No  Possible current pregnancy? No Is the patient on methotrexate? no  Current Complaints / other details:  Etter Sjogren here for consult for left proximal humerus metastases.  She is reporting pain in her right shoulder that she is rating at a 6/10.  She has lymphedema in her right arm.  She is concerned about transportation to get to radiation everyday.  She is currently receiving physical therapy to her left arm.

## 2013-01-31 ENCOUNTER — Ambulatory Visit: Payer: Medicare Other | Admitting: Oncology

## 2013-01-31 ENCOUNTER — Ambulatory Visit
Admission: RE | Admit: 2013-01-31 | Discharge: 2013-01-31 | Disposition: A | Discharge status: Home / Self Care | Payer: Medicare Other | Source: Ambulatory Visit | Attending: Radiation Oncology | Admitting: Radiation Oncology

## 2013-01-31 ENCOUNTER — Encounter: Payer: Self-pay | Admitting: Radiation Oncology

## 2013-01-31 VITALS — BP 141/72 | HR 86 | Temp 98.1°F | Ht 60.0 in | Wt 222.6 lb

## 2013-01-31 DIAGNOSIS — Z923 Personal history of irradiation: Secondary | ICD-10-CM | POA: Insufficient documentation

## 2013-01-31 DIAGNOSIS — I890 Lymphedema, not elsewhere classified: Secondary | ICD-10-CM | POA: Insufficient documentation

## 2013-01-31 DIAGNOSIS — E079 Disorder of thyroid, unspecified: Secondary | ICD-10-CM | POA: Insufficient documentation

## 2013-01-31 DIAGNOSIS — C50919 Malignant neoplasm of unspecified site of unspecified female breast: Secondary | ICD-10-CM | POA: Insufficient documentation

## 2013-01-31 DIAGNOSIS — C7952 Secondary malignant neoplasm of bone marrow: Secondary | ICD-10-CM | POA: Insufficient documentation

## 2013-01-31 DIAGNOSIS — C7951 Secondary malignant neoplasm of bone: Secondary | ICD-10-CM | POA: Insufficient documentation

## 2013-01-31 DIAGNOSIS — E785 Hyperlipidemia, unspecified: Secondary | ICD-10-CM | POA: Insufficient documentation

## 2013-01-31 DIAGNOSIS — Z170 Estrogen receptor positive status [ER+]: Secondary | ICD-10-CM | POA: Insufficient documentation

## 2013-01-31 DIAGNOSIS — E119 Type 2 diabetes mellitus without complications: Secondary | ICD-10-CM | POA: Insufficient documentation

## 2013-01-31 DIAGNOSIS — Z79899 Other long term (current) drug therapy: Secondary | ICD-10-CM | POA: Insufficient documentation

## 2013-01-31 HISTORY — DX: Malignant neoplasm of unspecified site of unspecified female breast: C50.919

## 2013-01-31 HISTORY — DX: Personal history of irradiation: Z92.3

## 2013-01-31 HISTORY — DX: Lymphedema, not elsewhere classified: I89.0

## 2013-01-31 HISTORY — DX: Secondary malignant neoplasm of unspecified site: C79.9

## 2013-01-31 NOTE — Progress Notes (Signed)
[facility] Radiation Oncology NEW PATIENT EVALUATION  Name: Christy Harrell MRN: [id number]  Date:   01/31/2013           DOB: 06-Oct-1940  Status: outpatient   CC: Sanda Linger, MD  Magrinat, Valentino Hue, MD Dr. Jones Broom   REFERRING PHYSICIAN: Magrinat, Valentino Hue, MD   DIAGNOSIS: Metastatic carcinoma breast to bone, left proximal humerus   HISTORY OF PRESENT ILLNESS:  Christy Harrell is a 72 y.o. female who is seen today for the courtesy of Dr. Darnelle Catalan for consideration of postoperative palliative radiation therapy to her left proximal humerus in the management of her metastatic carcinoma the breast. A screening mammogram in March of 2012 showed a lobulated mass in the right subareolar region measuring up to 5.5 cm. Biopsy was diagnostic for invasive ductal carcinoma, grade 2, ER positive and PR positive. There were associated calcifications representing DCIS. On 11/17/1998 well she underwent a right partial mastectomy and axillary lymph node dissection for a 3 cm invasive ductal carcinoma with one of 16 lymph nodes positive for metastatic disease. She completed radiation therapy on 03/06/1999 well. Her Oncotype DX score was 27 and a decision was made not to proceed with chemotherapy. She was started on anastrozole on completion of radiation therapy. She then return to [facility] to care for her elderly mother with dementia. While trying to fasten a seatbelt she suffered a pathologic fracture of her left proximal humerus. Plain x-rays showed a lytic lesion along the proximal left humerus. She underwent placement of an intramedullary nail along with a bone biopsy then be diagnostic for metastatic carcinoma breast which was ER +100% and PR/HER-2/neu negative. Dr. Darnelle Catalan started her on fulvestrant and zoledronic acid while stopping her anastrozole. Dr. Ave Filter was her orthopedic surgeon. She is starting physical therapy. She cares for her elderly mother at home. Of note is that  she does have lymphedema of the right upper extremity following surgery or radiation therapy.  PREVIOUS RADIATION THERAPY: History of right breast plus or minus nodal irradiation following a right partial mastectomy and actually no dissection in the summer of 2012 she was treated in [place] and medical records have been requested.   PAST MEDICAL HISTORY:  has a past medical history of Cancer; Club foot; Arthritis; Thyroid disease; Hyperlipidemia; Diabetes mellitus; Osteoporosis; Hypertension; GERD (gastroesophageal reflux disease); Neuromuscular disorder; Wears glasses; Metastasis from malignant tumor of breast; History of radiation therapy; and Lymphedema.     PAST SURGICAL HISTORY:  Past Surgical History  Procedure Laterality Date  . Cesarean section    . Thyroidectomy  2002  . Foot surgery      as a child for club foot  . Breast surgery  2012    rt lump-16 nodes-in [place]  . Cesarean section    . Colonoscopy    . Dilation and curettage of uterus    . Humerus im nail Left 12/27/2012    Procedure: INTRAMEDULLARY (IM) NAIL HUMERAL LEFT PATHOLOGIC FRACTURE;  Surgeon: Mable Paris, MD;  Location: [facility];  Service: Orthopedics;  Laterality: Left;     FAMILY HISTORY: family history includes Dementia in her mother and Hearing loss in her mother.  There is no history of Cancer, and Arthritis, and Heart disease, and Hyperlipidemia, and Hypertension, . She never knew her biologic father. Her mother is 80 and his demented.   SOCIAL HISTORY:  reports that she quit smoking about 38 years ago. She has never used smokeless tobacco. She reports that  she does not drink alcohol or use illicit drugs. Never married, one daughter age 72. 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   ALLERGIES: Review of patient's allergies indicates no known allergies.   MEDICATIONS:  Current Outpatient Prescriptions  Medication Sig Dispense Refill  .  diclofenac sodium (VOLTAREN) 1 % GEL Apply 2 g topically 4 (four) times daily.  2 Tube  2  . fentaNYL (DURAGESIC - DOSED MCG/HR) 25 MCG/HR Place 1 patch onto the skin every 3 (three) days.      . furosemide (LASIX) 40 MG tablet Take 40 mg by mouth daily.      Marland Kitchen levothyroxine (SYNTHROID, LEVOTHROID) 125 MCG tablet Take 1 tablet (125 mcg total) by mouth daily.  90 tablet  1  . metFORMIN (GLUCOPHAGE) 500 MG tablet TAKE 1 TABLET ONCE DAILY.  30 tablet  5  . oxyCODONE (ROXICODONE) 5 MG immediate release tablet 1-2 every 4-6 hours prn pain  60 tablet  0  . oxyCODONE-acetaminophen (PERCOCET/ROXICET) 5-325 MG per tablet Take 2 tablets by mouth every 4 (four) hours as needed for pain.  15 tablet  0  . Polyethyl Glycol-Propyl Glycol (SYSTANE ULTRA OP) Place 1 drop into both eyes daily.      Marland Kitchen anastrozole (ARIMIDEX) 1 MG tablet Take 1 mg by mouth daily.      . Calcium Carbonate-Vitamin D (CALCIUM 600 + D PO) Take 1 tablet by mouth.      . pregabalin (LYRICA) 75 MG capsule Take 75 mg by mouth 2 (two) times daily. Not taking       No current facility-administered medications for this encounter.     REVIEW OF SYSTEMS:  Pertinent items are noted in HPI.    PHYSICAL EXAM:  height is 5' (1.524 m) and weight is 222 lb 9.6 oz (100.971 kg). Her temperature is 98.1 F (36.7 C). Her blood pressure is 141/72 and her pulse is 86.   Alert and oriented 72 year old after American female appearing younger than her stated age. Head and neck examination: Grossly unremarkable. Nodes: Without palpable cervical, supraclavicular, or axillary lymphadenopathy. Chest: Lungs clear. Heart: Regular rate and rhythm. Breasts: There is residual right breast thickening and hyperpigmentation from her radiation therapy. Radiation tattoos are seen. There is a partial mastectomy scar inferior to the areola. No masses are appreciated left breast without masses or lesions. Abdomen obese and without hepatomegaly. Extremities 2-3+ right upper  extremity lymphedema. She has well-healed was along her proximal left humerus/shoulder from her recent surgery. There is no left upper extremity lymphedema. She wears a sleeve and glove.   LABORATORY DATA:  Lab Results  Component Value Date   WBC 7.4 01/23/2013   HGB 10.7* 01/23/2013   HCT 33.9* 01/23/2013   MCV 89.4 01/23/2013   PLT 295 01/23/2013   Lab Results  Component Value Date   NA 141 01/23/2013   K 4.1 01/23/2013   CL 105 01/23/2013   CO2 26 01/23/2013   Lab Results  Component Value Date   ALT 7 01/23/2013   AST 11 01/23/2013   ALKPHOS 105 01/23/2013   BILITOT 0.30 01/23/2013      IMPRESSION:  Metastatic carcinoma breast to bone with what appears to be a solitary metastasis based on her bone scan and PET scan. She should undergo postoperative radiation therapy to reduce her risk for local recurrence. I anticipate a three-week course of fractionated radiation therapy. We discussed the potential acute and late toxicities of radiation therapy which  should be well tolerated.  PLAN:  As discussed above. She does not want to start her radiation therapy until mid July, and this is certainly satisfactory. We will set her up for simulation/treatment planning in early July. In the meantime, we'll get her radiation therapy records from [place].  I spent 55 minutes minutes face to face with the patient and more than 50% of that time was spent in counseling and/or coordination of care.

## 2013-01-31 NOTE — Progress Notes (Signed)
Please see the Nurse Progress Note in the MD Initial Consult Encounter for this patient. 

## 2013-02-06 ENCOUNTER — Ambulatory Visit: Payer: Medicare Other

## 2013-02-06 ENCOUNTER — Ambulatory Visit (HOSPITAL_BASED_OUTPATIENT_CLINIC_OR_DEPARTMENT_OTHER): Payer: Medicare Other

## 2013-02-06 VITALS — BP 108/66 | HR 74 | Temp 98.3°F

## 2013-02-06 DIAGNOSIS — C50919 Malignant neoplasm of unspecified site of unspecified female breast: Secondary | ICD-10-CM

## 2013-02-06 DIAGNOSIS — C50911 Malignant neoplasm of unspecified site of right female breast: Secondary | ICD-10-CM

## 2013-02-06 DIAGNOSIS — Z5111 Encounter for antineoplastic chemotherapy: Secondary | ICD-10-CM

## 2013-02-06 DIAGNOSIS — M899 Disorder of bone, unspecified: Secondary | ICD-10-CM

## 2013-02-06 MED ORDER — FULVESTRANT 250 MG/5ML IM SOLN
500.0000 mg | INTRAMUSCULAR | Status: DC
  Administered 2013-02-06: 500 mg via INTRAMUSCULAR
  Filled 2013-02-06: qty 10

## 2013-02-06 NOTE — Patient Instructions (Signed)
Fulvestrant injection What is this medicine? FULVESTRANT (ful VES trant) blocks the effects of estrogen. It is used to treat breast cancer in women past the age of menopause. This medicine may be used for other purposes; ask your health care provider or pharmacist if you have questions. What should I tell my health care provider before I take this medicine? They need to know if you have any of these conditions: -bleeding problems -liver disease -low levels of platelets in the blood -an unusual or allergic reaction to fulvestrant, other medicines, foods, dyes, or preservatives -pregnant or trying to get pregnant -breast-feeding How should I use this medicine? This medicine is for injection into a muscle. It is usually given by a health care professional in a hospital or clinic setting. Talk to your pediatrician regarding the use of this medicine in children. Special care may be needed. Overdosage: If you think you have taken too much of this medicine contact a poison control center or emergency room at once. NOTE: This medicine is only for you. Do not share this medicine with others. What if I miss a dose? It is important not to miss your dose. Call your doctor or health care professional if you are unable to keep an appointment. What may interact with this medicine? -medicines that treat or prevent blood clots like warfarin, enoxaparin, and dalteparin This list may not describe all possible interactions. Give your health care provider a list of all the medicines, herbs, non-prescription drugs, or dietary supplements you use. Also tell them if you smoke, drink alcohol, or use illegal drugs. Some items may interact with your medicine. What should I watch for while using this medicine? Your condition will be monitored carefully while you are receiving this medicine. You will need important blood work done while you are taking this medicine. Do not become pregnant while taking this medicine.  Women should inform their doctor if they wish to become pregnant or think they might be pregnant. There is a potential for serious side effects to an unborn child. Talk to your health care professional or pharmacist for more information. What side effects may I notice from receiving this medicine? Side effects that you should report to your doctor or health care professional as soon as possible: -allergic reactions like skin rash, itching or hives, swelling of the face, lips, or tongue -feeling faint or lightheaded, falls -fever or flu-like symptoms -sore throat -vaginal bleeding Side effects that usually do not require medical attention (report to your doctor or health care professional if they continue or are bothersome): -aches, pains -constipation or diarrhea -headache -hot flashes -nausea, vomiting -pain at site where injected -stomach pain This list may not describe all possible side effects. Call your doctor for medical advice about side effects. You may report side effects to FDA at 1-800-FDA-1088. Where should I keep my medicine? This drug is given in a hospital or clinic and will not be stored at home. NOTE: This sheet is a summary. It may not cover all possible information. If you have questions about this medicine, talk to your doctor, pharmacist, or health care provider.  2013, Elsevier/Gold Standard. (12/05/2007 3:39:24 PM)  

## 2013-02-07 ENCOUNTER — Ambulatory Visit: Payer: Medicare Other | Attending: Orthopedic Surgery | Admitting: Physical Therapy

## 2013-02-07 DIAGNOSIS — I890 Lymphedema, not elsewhere classified: Secondary | ICD-10-CM | POA: Insufficient documentation

## 2013-02-07 DIAGNOSIS — C50919 Malignant neoplasm of unspecified site of unspecified female breast: Secondary | ICD-10-CM | POA: Insufficient documentation

## 2013-02-07 DIAGNOSIS — IMO0001 Reserved for inherently not codable concepts without codable children: Secondary | ICD-10-CM | POA: Insufficient documentation

## 2013-02-09 ENCOUNTER — Encounter: Payer: Medicare Other | Admitting: Physical Therapy

## 2013-02-13 ENCOUNTER — Ambulatory Visit: Payer: Medicare Other | Admitting: Physical Therapy

## 2013-02-14 ENCOUNTER — Ambulatory Visit
Admission: RE | Admit: 2013-02-14 | Discharge: 2013-02-14 | Disposition: A | Discharge status: Home / Self Care | Payer: Medicare Other | Source: Ambulatory Visit | Attending: Radiation Oncology | Admitting: Radiation Oncology

## 2013-02-14 DIAGNOSIS — M25519 Pain in unspecified shoulder: Secondary | ICD-10-CM | POA: Insufficient documentation

## 2013-02-14 DIAGNOSIS — L988 Other specified disorders of the skin and subcutaneous tissue: Secondary | ICD-10-CM | POA: Insufficient documentation

## 2013-02-14 DIAGNOSIS — C50919 Malignant neoplasm of unspecified site of unspecified female breast: Secondary | ICD-10-CM | POA: Insufficient documentation

## 2013-02-14 DIAGNOSIS — Y842 Radiological procedure and radiotherapy as the cause of abnormal reaction of the patient, or of later complication, without mention of misadventure at the time of the procedure: Secondary | ICD-10-CM | POA: Insufficient documentation

## 2013-02-14 DIAGNOSIS — C7951 Secondary malignant neoplasm of bone: Secondary | ICD-10-CM | POA: Insufficient documentation

## 2013-02-14 DIAGNOSIS — C7952 Secondary malignant neoplasm of bone marrow: Secondary | ICD-10-CM | POA: Insufficient documentation

## 2013-02-14 DIAGNOSIS — I890 Lymphedema, not elsewhere classified: Secondary | ICD-10-CM | POA: Insufficient documentation

## 2013-02-14 DIAGNOSIS — Z510 Encounter for antineoplastic radiation therapy: Secondary | ICD-10-CM | POA: Insufficient documentation

## 2013-02-14 NOTE — Progress Notes (Signed)
Complex simulation/treatment planning note: The patient was taken to the CT simulator. A Vac lock immobilization device was constructed for immobilization. The breast was take inferiorly. She was then scanned. I contoured her proximal humerus/tumor. She was set up to AP and PA fields. 2 separate multileaf collimators were designed to conform the field. I prescribing 3500 cGy in 14 sessions utilizing 6 MV photons.

## 2013-02-16 ENCOUNTER — Ambulatory Visit: Payer: Medicare Other | Admitting: Physical Therapy

## 2013-02-17 ENCOUNTER — Other Ambulatory Visit: Payer: Self-pay | Admitting: *Deleted

## 2013-02-17 DIAGNOSIS — C50911 Malignant neoplasm of unspecified site of right female breast: Secondary | ICD-10-CM

## 2013-02-17 DIAGNOSIS — C7951 Secondary malignant neoplasm of bone: Secondary | ICD-10-CM

## 2013-02-20 ENCOUNTER — Other Ambulatory Visit: Payer: Self-pay | Admitting: Physician Assistant

## 2013-02-20 ENCOUNTER — Telehealth: Payer: Self-pay | Admitting: *Deleted

## 2013-02-20 ENCOUNTER — Encounter: Payer: Self-pay | Admitting: Physician Assistant

## 2013-02-20 ENCOUNTER — Other Ambulatory Visit (HOSPITAL_BASED_OUTPATIENT_CLINIC_OR_DEPARTMENT_OTHER): Payer: Medicare Other | Admitting: Lab

## 2013-02-20 ENCOUNTER — Ambulatory Visit (HOSPITAL_BASED_OUTPATIENT_CLINIC_OR_DEPARTMENT_OTHER): Payer: Medicare Other

## 2013-02-20 ENCOUNTER — Ambulatory Visit (HOSPITAL_BASED_OUTPATIENT_CLINIC_OR_DEPARTMENT_OTHER): Payer: Medicare Other | Admitting: Physician Assistant

## 2013-02-20 DIAGNOSIS — C50911 Malignant neoplasm of unspecified site of right female breast: Secondary | ICD-10-CM

## 2013-02-20 DIAGNOSIS — C7951 Secondary malignant neoplasm of bone: Secondary | ICD-10-CM

## 2013-02-20 DIAGNOSIS — C50019 Malignant neoplasm of nipple and areola, unspecified female breast: Secondary | ICD-10-CM

## 2013-02-20 DIAGNOSIS — C7952 Secondary malignant neoplasm of bone marrow: Secondary | ICD-10-CM

## 2013-02-20 DIAGNOSIS — F411 Generalized anxiety disorder: Secondary | ICD-10-CM

## 2013-02-20 DIAGNOSIS — E8989 Other postprocedural endocrine and metabolic complications and disorders: Secondary | ICD-10-CM

## 2013-02-20 DIAGNOSIS — M899 Disorder of bone, unspecified: Secondary | ICD-10-CM

## 2013-02-20 DIAGNOSIS — K5900 Constipation, unspecified: Secondary | ICD-10-CM

## 2013-02-20 DIAGNOSIS — Z5111 Encounter for antineoplastic chemotherapy: Secondary | ICD-10-CM

## 2013-02-20 LAB — CBC WITH DIFFERENTIAL/PLATELET
BASO%: 0.5 % (ref 0.0–2.0)
EOS%: 4.3 % (ref 0.0–7.0)
MCH: 28.1 pg (ref 25.1–34.0)
MCHC: 31.8 g/dL (ref 31.5–36.0)
NEUT%: 57.2 % (ref 38.4–76.8)
RBC: 3.98 10*6/uL (ref 3.70–5.45)
RDW: 15.1 % — ABNORMAL HIGH (ref 11.2–14.5)
WBC: 6.5 10*3/uL (ref 3.9–10.3)
lymph#: 2 10*3/uL (ref 0.9–3.3)

## 2013-02-20 LAB — COMPREHENSIVE METABOLIC PANEL (CC13)
ALT: 10 U/L (ref 0–55)
AST: 12 U/L (ref 5–34)
Calcium: 9.5 mg/dL (ref 8.4–10.4)
Chloride: 107 mEq/L (ref 98–109)
Creatinine: 0.7 mg/dL (ref 0.6–1.1)
Potassium: 4 mEq/L (ref 3.5–5.1)
Sodium: 141 mEq/L (ref 136–145)
Total Protein: 7.2 g/dL (ref 6.4–8.3)

## 2013-02-20 MED ORDER — SODIUM CHLORIDE 0.9 % IV SOLN
INTRAVENOUS | Status: DC
  Administered 2013-02-20: 13:00:00 via INTRAVENOUS

## 2013-02-20 MED ORDER — ZOLEDRONIC ACID 4 MG/100ML IV SOLN
4.0000 mg | Freq: Once | INTRAVENOUS | Status: AC
  Administered 2013-02-20: 4 mg via INTRAVENOUS
  Filled 2013-02-20: qty 100

## 2013-02-20 MED ORDER — ZOLEDRONIC ACID 4 MG/100ML IV SOLN
4.0000 mg | Freq: Once | INTRAVENOUS | Status: DC
  Filled 2013-02-20: qty 100

## 2013-02-20 MED ORDER — FULVESTRANT 250 MG/5ML IM SOLN
500.0000 mg | Freq: Once | INTRAMUSCULAR | Status: AC
  Administered 2013-02-20: 500 mg via INTRAMUSCULAR
  Filled 2013-02-20: qty 10

## 2013-02-20 NOTE — Telephone Encounter (Signed)
sw pt gv appt d/t for 05/09/13 with labs@11am  and ov @11 :30am.the patient is aware...td

## 2013-02-20 NOTE — Patient Instructions (Addendum)
Zoledronic Acid injection (Hypercalcemia, Oncology) What is this medicine? ZOLEDRONIC ACID (ZOE le dron ik AS id) lowers the amount of calcium loss from bone. It is used to treat too much calcium in your blood from cancer. It is also used to prevent complications of cancer that has spread to the bone. This medicine may be used for other purposes; ask your health care provider or pharmacist if you have questions. What should I tell my health care provider before I take this medicine? They need to know if you have any of these conditions: -aspirin-sensitive asthma -dental disease -kidney disease -an unusual or allergic reaction to zoledronic acid, other medicines, foods, dyes, or preservatives -pregnant or trying to get pregnant -breast-feeding How should I use this medicine? This medicine is for infusion into a vein. It is given by a health care professional in a hospital or clinic setting. Talk to your pediatrician regarding the use of this medicine in children. Special care may be needed. Overdosage: If you think you have taken too much of this medicine contact a poison control center or emergency room at once. NOTE: This medicine is only for you. Do not share this medicine with others. What if I miss a dose? It is important not to miss your dose. Call your doctor or health care professional if you are unable to keep an appointment. What may interact with this medicine? -certain antibiotics given by injection -NSAIDs, medicines for pain and inflammation, like ibuprofen or naproxen -some diuretics like bumetanide, furosemide -teriparatide -thalidomide This list may not describe all possible interactions. Give your health care provider a list of all the medicines, herbs, non-prescription drugs, or dietary supplements you use. Also tell them if you smoke, drink alcohol, or use illegal drugs. Some items may interact with your medicine. What should I watch for while using this medicine? Visit  your doctor or health care professional for regular checkups. It may be some time before you see the benefit from this medicine. Do not stop taking your medicine unless your doctor tells you to. Your doctor may order blood tests or other tests to see how you are doing. Women should inform their doctor if they wish to become pregnant or think they might be pregnant. There is a potential for serious side effects to an unborn child. Talk to your health care professional or pharmacist for more information. You should make sure that you get enough calcium and vitamin D while you are taking this medicine. Discuss the foods you eat and the vitamins you take with your health care professional. Some people who take this medicine have severe bone, joint, and/or muscle pain. This medicine may also increase your risk for a broken thigh bone. Tell your doctor right away if you have pain in your upper leg or groin. Tell your doctor if you have any pain that does not go away or that gets worse. What side effects may I notice from receiving this medicine? Side effects that you should report to your doctor or health care professional as soon as possible: -allergic reactions like skin rash, itching or hives, swelling of the face, lips, or tongue -anxiety, confusion, or depression -breathing problems -changes in vision -feeling faint or lightheaded, falls -jaw burning, cramping, pain -muscle cramps, stiffness, or weakness -trouble passing urine or change in the amount of urine Side effects that usually do not require medical attention (report to your doctor or health care professional if they continue or are bothersome): -bone, joint, or muscle pain -  fever -hair loss -irritation at site where injected -loss of appetite -nausea, vomiting -stomach upset -tired This list may not describe all possible side effects. Call your doctor for medical advice about side effects. You may report side effects to FDA at  1-800-FDA-1088. Where should I keep my medicine? This drug is given in a hospital or clinic and will not be stored at home. NOTE: This sheet is a summary. It may not cover all possible information. If you have questions about this medicine, talk to your doctor, pharmacist, or health care provider.  2012, Elsevier/Gold Standard. (01/23/2011 9:06:58 AM) 

## 2013-02-20 NOTE — Telephone Encounter (Signed)
Per staff message and POF I have scheduled appts.  JMW  

## 2013-02-20 NOTE — Telephone Encounter (Signed)
appts made and printed. Pt is aware that im going to have to call her once i get some time slots, due to the times that avail she can not attend them. i emailed [facility] nad [facility] to make them aware. Pt is aware of her xcr and understand that cs will call her for her bone scan...td..td

## 2013-02-20 NOTE — Progress Notes (Signed)
ID: Christy Harrell   DOB: February 15, 1941  MR#: [id number]  NFA#:[id number]  PCP: Sanda Linger, MD GYN:  SU:  OTHER MD:  Claudette Laws, Gean Birchwood, Marcelyn Bruins   HISTORY OF PRESENT ILLNESS: The patient had routine screening mammography 10/16/2010 showing a lobulated mass in the right subareolar region measuring up to 5.5 cm. The Left breast showed some central microcalcifications. Biopsy of the Right breast mass on 10/28/2010 showed an invasive ductal carcinoma, grade 2, estrogen receptor positive (Allred score 8), progesterone receptor positive (Allred score 5), with an equivocal HER-2. The Left breast area of microcalcifications was biopsied at the same time, and was read as suspicious for DCIS.  On 11/17/2010 the patient underwent Right lumpectomy and axillary lymph node dissection for what proved to be a 3 cm invasive ductal carcinoma, grade 2, with some papillary and mucinous features. One of 16 lymph nodes was involved. FISH showed no HER-2 amplification. Left breast biopsy was benign.  The patient had an Oncotype sent, with a score of 27, predicting a risk of distant recurrence after 5 years of tamoxifen in the 18% range. With this intermediate result, the decision was made not to proceed with chemotherapy, since the patient is the sole caregiver to her very elderly mother. Instead the patient proceeded to radiation treatment which was completed 03/06/2011. She started anastrozole at that point. Her subsequent history is as detailed below   INTERVAL HISTORY: Christy Harrell returns today for followup of her metastatic breast carcinoma with known bone involvement. Since her last visit here in early June, she has initiated fulvestrant injections and zoledronic acid. She received loading injections of fulvestrant on June 16 and June 30, and received her first monthly infusion of zoledronic acid on June 16. She's tolerated these agents will overall. She has had some itching in  the injection sites which is helped by hydrocortisone cream. She is having increased hot flashes. She is noted no increased bone pain, and specifically denies any jaw pain.   She has also been evaluated by Dr. Dayton Scrape and is ready to initiate radiation therapy to the left shoulder beginning next week.   REVIEW OF SYSTEMS: Christy Harrell denies any fevers or chills. She's had no skin changes or rashes and denies any abnormal bleeding. Her appetite is fairly good and she has had no nausea or emesis. She has mild constipation. She's had some occasional pain in the left lower abdomen. She's had no blood in the stool. She denies any new cough or increased shortness of breath. She's had no chest pain or palpitations. She denies any abnormal headaches or dizziness. She continues to have some mild anxiety.  A detailed review of systems is otherwise stable and noncontributory.   PAST MEDICAL HISTORY: Past Medical History  Diagnosis Date  . Cancer     right breast  . Club foot   . Arthritis   . Thyroid disease   . Hyperlipidemia   . Diabetes mellitus   . Osteoporosis   . Hypertension   . GERD (gastroesophageal reflux disease)     watches diet  . Neuromuscular disorder     lt arm numb sometimes  . Wears glasses   . 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 History of radiation therapy   . Lymphedema     right arm    PAST SURGICAL HISTORY: Past Surgical History  Procedure Laterality Date  . Cesarean section    . Thyroidectomy  2002  .  Foot surgery      as a child for club foot  . Breast surgery  2012    rt lump-16 nodes-in [place]  . Cesarean section    . Colonoscopy    . Dilation and curettage of uterus    . Humerus im nail Left 12/27/2012    Procedure: INTRAMEDULLARY (IM) NAIL HUMERAL LEFT PATHOLOGIC FRACTURE;  Surgeon: Mable Paris, MD;  Location: [facility];  Service: Orthopedics;  Laterality: Left;    FAMILY HISTORY Family History   Problem Relation Age of Onset  . Cancer Neg Hx   . Arthritis Neg Hx   . Heart disease Neg Hx   . Hyperlipidemia Neg Hx   . Hypertension Neg Hx   . Dementia Mother   . Hearing loss Mother    The patient's father died from unknown causes. The patient's mother is alive at age 70. The patient was a single child. There is no history of breast or ovarian cancer in the family to her knowledge.  GYNECOLOGIC HISTORY: Menarche age 25, menopause in her early 95s. The patient is GX P1, first pregnancy to term age 62. She did not take hormone replacement  SOCIAL HISTORY: The patient grew up in [place], attended [employer], then moved to [place] where she lived about 40 years. 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 Her mother, [name], 71, lives with the patient. The patient's daughter [name], 84, is disabled secondary to pulmonary hypertension. She can be reached at [phone]. The patient has 2 grandchildren, [name] who works at a Human resources officer business and is 72 years old, in general, 85, completing high school.   ADVANCED DIRECTIVES:  HEALTH MAINTENANCE: History  Substance Use Topics  . Smoking status: Former Smoker    Quit date: 12/23/1974  . Smokeless tobacco: Never Used  . Alcohol Use: No     Colonoscopy:  PAP:    Bone density:  2012, on ibandronate chronically  Lipid panel: UTD  No Known Allergies  Current Outpatient Prescriptions  Medication Sig Dispense Refill  . diclofenac sodium (VOLTAREN) 1 % GEL Apply 2 g topically 4 (four) times daily.  2 Tube  2  . fentaNYL (DURAGESIC - DOSED MCG/HR) 25 MCG/HR Place 1 patch onto the skin every 3 (three) days.      . furosemide (LASIX) 40 MG tablet Take 40 mg by mouth daily.      Marland Kitchen levothyroxine (SYNTHROID, LEVOTHROID) 125 MCG tablet Take 1 tablet (125 mcg total) by mouth daily.  90 tablet  1  . metFORMIN (GLUCOPHAGE) 500 MG tablet TAKE 1 TABLET ONCE DAILY.  30 tablet  5  .  oxyCODONE (ROXICODONE) 5 MG immediate release tablet 1-2 every 4-6 hours prn pain  60 tablet  0  . oxyCODONE-acetaminophen (PERCOCET/ROXICET) 5-325 MG per tablet Take 2 tablets by mouth every 4 (four) hours as needed for pain.  15 tablet  0  . Polyethyl Glycol-Propyl Glycol (SYSTANE ULTRA OP) Place 1 drop into both eyes daily.      Marland Kitchen docusate sodium (COLACE) 100 MG capsule Take 100 mg by mouth 2 (two) times daily as needed for constipation.      . pregabalin (LYRICA) 75 MG capsule Take 75 mg by mouth 2 (two) times daily. Not taking       No current facility-administered medications for this visit.   PHYSICAL EXAM: Elderly African American woman who appears anxious but is in no acute distress Filed  Vitals:   02/20/13 1132  BP: 139/78  Pulse: 147  Temp: 98.8 F (37.1 C)  Resp: 20   Body mass index is 42.71 kg/(m^2).    ECOG FS: 2 Filed Weights   02/20/13 1132  Weight: 218 lb 11.2 oz (99.202 kg)    Sclerae unicteric Oropharynx clear, no ulcerations or evidence of candidiasis No cervical or supraclavicular adenopathy Lungs no rales or rhonchi Heart regular rate and rhythm Abdomen obese, soft, nontender to palpation, positive bowel sounds MSK chronic lymphedema right upper extremity with compression sleeve and glove in place; no edema noted in the left upper extremity, although range of motion is still slightly limited secondary to discomfort. Neuro: nonfocal, well oriented, anxious affect Breasts: Deferred   LAB RESULTS: Lab Results  Component Value Date   WBC 6.5 02/20/2013   NEUTROABS 3.7 02/20/2013   HGB 11.2* 02/20/2013   HCT 35.2 02/20/2013   MCV 88.4 02/20/2013   PLT 293 02/20/2013      Chemistry      Component Value Date/Time   NA 141 01/23/2013 1337   NA 140 12/23/2012 1100   K 4.1 01/23/2013 1337   K 4.5 12/23/2012 1100   CL 105 01/23/2013 1337   CL 104 12/23/2012 1100   CO2 26 01/23/2013 1337   CO2 28 12/23/2012 1100   BUN 8.8 01/23/2013 1337   BUN 13 12/23/2012 1100    CREATININE 0.8 01/23/2013 1337   CREATININE 0.82 12/23/2012 1100      Component Value Date/Time   CALCIUM 9.6 01/23/2013 1337   CALCIUM 9.7 12/23/2012 1100   ALKPHOS 105 01/23/2013 1337   ALKPHOS 75 07/27/2012 1401   AST 11 01/23/2013 1337   AST 17 07/27/2012 1401   ALT 7 01/23/2013 1337   ALT 14 07/27/2012 1401   BILITOT 0.30 01/23/2013 1337   BILITOT 0.4 07/27/2012 1401       Lab Results  Component Value Date   LABCA2 24 11/19/2011    STUDIES: Nm Pet Image Restag (ps) Skull Base To Thigh  01/13/2013   *RADIOLOGY REPORT*  Clinical Data: Subsequent treatment strategy for breast cancer. Bone lesion left shoulder.  NUCLEAR MEDICINE PET SKULL BASE TO THIGH  Fasting Blood Glucose:  106  Technique:  18.2 mCi F-18 FDG was injected intravenously. CT data was obtained and used for attenuation correction and anatomic localization only.  (This was not acquired as a diagnostic CT examination.) Additional exam technical data entered on technologist worksheet.  Comparison:  None.  Findings:  Neck: No hypermetabolic lymph nodes in the neck.  Chest:  No hypermetabolic mediastinal or hilar nodes.  No suspicious pulmonary nodules on the CT scan. There are surgical and radiation changes involving the right breast. Diffuse increased uptake in the skin overlying the breast is probably due to radiation change. No obvious breast masses.  Abdomen/Pelvis:  No abnormal hypermetabolic activity within the liver, pancreas, adrenal glands, or spleen.  No hypermetabolic lymph nodes in the abdomen or pelvis.  Skeleton:  Diffuse increased uptake around the left shoulder consistent with recent surgery.  I do not see any definite bone metastasis otherwise.  IMPRESSION:  1.  Uptake around the right shoulder consistent with recent surgery. 2.  No findings to suggest other areas of osseous metastatic disease. 3.  Surgical and radiation changes involving the right breast but no findings suspicious for recurrent tumor or adenopathy.    Original Report Authenticated By: Rudie Meyer, M.D.     ASSESSMENT: 72 y.o.  [place] woman   (  1)  status post right lumpectomy and axillary lymph node dissection 11/17/2010 for a pT2 pN1, stage IIB invasive ductal carcinoma, grade 2, estrogen and progesterone receptor positive, HER-2 negative,   (2) Oncotype DX recurrence score of 27 predicting a risk of distant recurrence of 18% with 5 years of tamoxifen (intermediate score);   (3)  status post radiation completed July of 2012,   (4) started anastrozole July 2012, discontinued June 2014 with evidence of metastatic spread to bone  (5) Chronic lymphedema in the right upper extremity.  (6) pathologic fracture of the left humerus along apparent lytic lesion, with no other bone lesions per bone scan 12/12/2012  (7) on 12/27/2012 underwent #1 intramedullary nail left pathologic proximal humerus fracture  #2 left shoulder rotator cuff repair  #3 left shoulder open bone biopsy with pathology showing metastatic breast adenocarcinoma, estrogen receptor 100% positive, progesterone receptor and HER-2 negative, with an MIB-1 of 26%.  (8) fulvestrant, first injections on 01/23/2013  (9) zolendronic acid given monthly, first dose 01/23/2013  PLAN:  Avika will receive her monthly doses of Faslodex and zoledronic acid today. I have advised her to continue using some cortisone cream at the injection sites on her buttocks, but to let us know if the itching worsens. I think this is going to be a local irritation from the injections, not an allergy to the medication.  I have also encouraged her to use a stool softener as needed for mild constipation, which is likely made worse when she takes the oxycodone for pain. Hopefully this will not only help her with her bowel movements, but will also decrease the pain she has had in her lower left abdomen.  Christy Harrell will receive radiation therapy as scheduled. Her next appointment here will be on  August 11. I will see her for followup that day, and she will also be due for her next monthly Faslodex and zoledronic acid. We will continue this regimen for 4 months, and per Dr. Darrall Dears plan, we will then repeat a chest x-ray and bone scan to assess response. He will see her in early October to review those results and discuss treatment plan.   Christy Harrell has a good understanding of this plan. She knows to call for any problems that may develop before her next visit here.  Read Bonelli    02/20/2013

## 2013-02-21 ENCOUNTER — Telehealth: Payer: Self-pay | Admitting: *Deleted

## 2013-02-21 ENCOUNTER — Other Ambulatory Visit: Payer: Self-pay | Admitting: Oncology

## 2013-02-21 ENCOUNTER — Ambulatory Visit
Admission: RE | Admit: 2013-02-21 | Discharge: 2013-02-21 | Disposition: A | Discharge status: Home / Self Care | Payer: Medicare Other | Source: Ambulatory Visit | Attending: Radiation Oncology | Admitting: Radiation Oncology

## 2013-02-21 ENCOUNTER — Ambulatory Visit: Payer: Medicare Other | Admitting: Physical Therapy

## 2013-02-21 DIAGNOSIS — C7951 Secondary malignant neoplasm of bone: Secondary | ICD-10-CM

## 2013-02-21 NOTE — Telephone Encounter (Signed)
Lm informed the pt that her ov with [facility]  On 03/20/13 was cancel and that she needs to come in @ 11am on that day for labs and tx.ii also emailed MW to adjust that time.Marland Kitchentd

## 2013-02-21 NOTE — Telephone Encounter (Signed)
Per staff message I have adjusted appt 

## 2013-02-21 NOTE — Progress Notes (Signed)
  Radiation Oncology         [phone] ________________________________  Name: Christy Harrell MRN: [id number]  Date: 02/21/2013  DOB: [birth year]  Simulation Verification Note  Status: outpatient  NARRATIVE: The patient was brought to the treatment unit and placed in the planned treatment position. The clinical setup was verified. Then port films were obtained and uploaded to the radiation oncology medical record software.  The treatment beams were carefully compared against the planned radiation fields. The position location and shape of the radiation fields was reviewed. They targeted volume of tissue appears to be appropriately covered by the radiation beams. Organs at risk appear to be excluded as planned.  Based on my personal review, I approved the simulation verification. The patient's treatment will proceed as planned.  -----------------------------------  Billie Lade, PhD, MD

## 2013-02-21 NOTE — Progress Notes (Signed)
Simulation verification note: The patient underwent simulation verification for treatment to her left shoulder. Her isocenter is in good position and the multileaf collimators contoured the treatment volume appropriately.

## 2013-02-21 NOTE — Addendum Note (Signed)
Encounter addended by: Billie Lade, MD on: 02/21/2013  6:57 PM<BR>     Documentation filed: Visit Diagnoses, Notes Section

## 2013-02-22 ENCOUNTER — Ambulatory Visit
Admission: RE | Admit: 2013-02-22 | Discharge: 2013-02-22 | Disposition: A | Discharge status: Home / Self Care | Payer: Medicare Other | Source: Ambulatory Visit | Attending: Radiation Oncology | Admitting: Radiation Oncology

## 2013-02-23 ENCOUNTER — Ambulatory Visit
Admission: RE | Admit: 2013-02-23 | Discharge: 2013-02-23 | Disposition: A | Discharge status: Home / Self Care | Payer: Medicare Other | Source: Ambulatory Visit | Attending: Radiation Oncology | Admitting: Radiation Oncology

## 2013-02-24 ENCOUNTER — Ambulatory Visit
Admission: RE | Admit: 2013-02-24 | Discharge: 2013-02-24 | Disposition: A | Discharge status: Home / Self Care | Payer: Medicare Other | Source: Ambulatory Visit | Attending: Radiation Oncology | Admitting: Radiation Oncology

## 2013-02-27 ENCOUNTER — Ambulatory Visit
Admission: RE | Admit: 2013-02-27 | Discharge: 2013-02-27 | Disposition: A | Discharge status: Home / Self Care | Payer: Medicare Other | Source: Ambulatory Visit | Attending: Radiation Oncology | Admitting: Radiation Oncology

## 2013-02-27 VITALS — BP 134/70 | HR 98 | Temp 98.7°F | Ht 60.0 in | Wt 217.3 lb

## 2013-02-27 DIAGNOSIS — C7951 Secondary malignant neoplasm of bone: Secondary | ICD-10-CM

## 2013-02-27 DIAGNOSIS — C50911 Malignant neoplasm of unspecified site of right female breast: Secondary | ICD-10-CM

## 2013-02-27 NOTE — Progress Notes (Signed)
   Weekly Management Note:  outpatient Current Dose:  10 Gy  Projected Dose: 35 Gy   Narrative:  The patient presents for routine under treatment assessment.  CBCT/MVCT images/Port film x-rays were reviewed.  The chart was checked. No new complaints  Physical Findings:  height is 5' (1.524 m) and weight is 217 lb 4.8 oz (98.567 kg). Her temperature is 98.7 F (37.1 C). Her blood pressure is 134/70 and her pulse is 98.  no skin irritation over left axilla/ humerus.  Impression:  The patient is tolerating radiotherapy.  Plan:  Continue radiotherapy as planned.    ________________________________   Lonie Peak, M.D.

## 2013-02-27 NOTE — Progress Notes (Signed)
Christy Harrell here for weekly under treat visit.  She has had 4 fractions to her left prox humerus.  She does have pain in her right arm from lymphedema.  She takes oxycodone for this which helps.  The skin on her left humerus is intact.  The scars from her surgery are pink.  She was given the Radiation Therapy and you book and discussed the potential side effects of treatment including skin changes and fatigue.  She was instructed to contact nursing with any questions or concerns.

## 2013-02-28 ENCOUNTER — Ambulatory Visit: Payer: Medicare Other | Admitting: Physical Therapy

## 2013-02-28 ENCOUNTER — Ambulatory Visit
Admission: RE | Admit: 2013-02-28 | Discharge: 2013-02-28 | Disposition: A | Discharge status: Home / Self Care | Payer: Medicare Other | Source: Ambulatory Visit | Attending: Radiation Oncology | Admitting: Radiation Oncology

## 2013-03-01 ENCOUNTER — Ambulatory Visit
Admission: RE | Admit: 2013-03-01 | Discharge: 2013-03-01 | Disposition: A | Discharge status: Home / Self Care | Payer: Medicare Other | Source: Ambulatory Visit | Attending: Radiation Oncology | Admitting: Radiation Oncology

## 2013-03-02 ENCOUNTER — Ambulatory Visit
Admission: RE | Admit: 2013-03-02 | Discharge: 2013-03-02 | Disposition: A | Discharge status: Home / Self Care | Payer: Medicare Other | Source: Ambulatory Visit | Attending: Radiation Oncology | Admitting: Radiation Oncology

## 2013-03-03 ENCOUNTER — Ambulatory Visit
Admission: RE | Admit: 2013-03-03 | Discharge: 2013-03-03 | Disposition: A | Discharge status: Home / Self Care | Payer: Medicare Other | Source: Ambulatory Visit | Attending: Radiation Oncology | Admitting: Radiation Oncology

## 2013-03-06 ENCOUNTER — Ambulatory Visit
Admission: RE | Admit: 2013-03-06 | Discharge: 2013-03-06 | Disposition: A | Discharge status: Home / Self Care | Payer: Medicare Other | Source: Ambulatory Visit | Attending: Radiation Oncology | Admitting: Radiation Oncology

## 2013-03-06 ENCOUNTER — Encounter: Payer: Self-pay | Admitting: Radiation Oncology

## 2013-03-06 VITALS — BP 134/89 | HR 108 | Temp 98.9°F | Resp 20 | Wt 218.6 lb

## 2013-03-06 DIAGNOSIS — C7951 Secondary malignant neoplasm of bone: Secondary | ICD-10-CM

## 2013-03-06 NOTE — Progress Notes (Signed)
Weekly Management Note:  Site: Proximal left humerus Current Dose:  2250  cGy Projected Dose: 3500  cGy  Narrative: The patient is seen today for routine under treatment assessment. CBCT/MVCT images/port films were reviewed. The chart was reviewed.   No complaints today except for the right shoulder pain after lifting up every groceries last Thursday. She continues with physical therapy for her left shoulder.  Physical Examination:  Filed Vitals:   03/06/13 1349  BP: 134/89  Pulse: 108  Temp: 98.9 F (37.2 C)  Resp: 20  .  Weight: 218 lb 9.6 oz (99.156 kg). No change.  Impression: Tolerating radiation therapy well. I told her that she should see her orthopedic surgeon, Dr. Ave Filter for evaluation of her right shoulder pain. Plan: Continue radiation therapy as planned.

## 2013-03-06 NOTE — Progress Notes (Addendum)
Weekly rad txs Lt prox humerus 9/14 txs, no pain in left shoulder, left hand swelling, Fatigue in the afternoonsphysical therapy one day week at present  1:53 PM  Can move it better than before, wearing right lymphedema slevebut right shoulder hurts,hears it crack", she picked up heavy groceries last Thursday at [facility], no skin iirritation  noted, skin intact 1:53 PM

## 2013-03-07 ENCOUNTER — Ambulatory Visit
Admission: RE | Admit: 2013-03-07 | Discharge: 2013-03-07 | Disposition: A | Discharge status: Home / Self Care | Payer: Medicare Other | Source: Ambulatory Visit | Attending: Radiation Oncology | Admitting: Radiation Oncology

## 2013-03-07 ENCOUNTER — Ambulatory Visit: Payer: Medicare Other | Admitting: Physical Therapy

## 2013-03-08 ENCOUNTER — Ambulatory Visit
Admission: RE | Admit: 2013-03-08 | Discharge: 2013-03-08 | Disposition: A | Discharge status: Home / Self Care | Payer: Medicare Other | Source: Ambulatory Visit | Attending: Radiation Oncology | Admitting: Radiation Oncology

## 2013-03-08 DIAGNOSIS — C7951 Secondary malignant neoplasm of bone: Secondary | ICD-10-CM

## 2013-03-08 MED ORDER — RADIAPLEXRX EX GEL
Freq: Once | CUTANEOUS | Status: AC
  Administered 2013-03-08: 14:00:00 via TOPICAL

## 2013-03-08 NOTE — Progress Notes (Signed)
Slight erythema under left arm/axilla area, patient given radiaplex gel with instructins to use after rad txs and bedtime, and to continue 2 weeks post rad txs,verbal understanding by pt 1:46 PM

## 2013-03-09 ENCOUNTER — Ambulatory Visit
Admission: RE | Admit: 2013-03-09 | Discharge: 2013-03-09 | Disposition: A | Discharge status: Home / Self Care | Payer: Medicare Other | Source: Ambulatory Visit | Attending: Radiation Oncology | Admitting: Radiation Oncology

## 2013-03-10 ENCOUNTER — Ambulatory Visit
Admission: RE | Admit: 2013-03-10 | Discharge: 2013-03-10 | Disposition: A | Discharge status: Home / Self Care | Payer: Medicare Other | Source: Ambulatory Visit | Attending: Radiation Oncology | Admitting: Radiation Oncology

## 2013-03-13 ENCOUNTER — Encounter: Payer: Self-pay | Admitting: Radiation Oncology

## 2013-03-13 ENCOUNTER — Ambulatory Visit
Admission: RE | Admit: 2013-03-13 | Discharge: 2013-03-13 | Disposition: A | Discharge status: Home / Self Care | Payer: Medicare Other | Source: Ambulatory Visit | Attending: Radiation Oncology | Admitting: Radiation Oncology

## 2013-03-13 VITALS — BP 104/66 | HR 75 | Temp 97.2°F | Resp 20 | Wt 217.2 lb

## 2013-03-13 DIAGNOSIS — C7951 Secondary malignant neoplasm of bone: Secondary | ICD-10-CM

## 2013-03-13 NOTE — Progress Notes (Addendum)
Weekly rad txs left prox humerus, no c/o pain today under left axilla, erythema, using radiaplex gel there bid,wearing lymphedema sleeve right arm, swelling left hand same as last week, , physical therpay tomorrow pt is in good spirits, not table to lift arm up much 1:39 PM

## 2013-03-13 NOTE — Progress Notes (Signed)
Weekly Management Note:  Site: Left proximal humerus Current Dose:  3500  cGy Projected Dose: 3500  cGy  Narrative: The patient is seen today for routine under treatment assessment. CBCT/MVCT images/port films were reviewed. The chart was reviewed.   She is without complaints today. She denies shoulder pain. Treatment is completed today. She has been using Radioplex gel along her axilla. She continues with her physical therapy for her right upper cavity lymphedema.  Physical Examination:  Filed Vitals:   03/13/13 1337  BP: 104/66  Pulse: 75  Temp: 97.2 F (36.2 C)  Resp: 20  .  Weight: 217 lb 3.2 oz (98.521 kg). There is moderate hyperpigmentation of the skin along the right axilla with patchy dry desquamation and no areas of moist desquamation.  Impression: Radiation therapy completed.  Plan: Followup appointment in one month.

## 2013-03-13 NOTE — Progress Notes (Signed)
[facility] Radiation Oncology End of Treatment Note  Name:Christy Harrell  Date: 03/13/2013 MWU:[id number] DOB:[birth year]   Status:outpatient    CC: Sanda Linger, MD  Dr. Marikay Alar Magrinat  REFERRING PHYSICIAN:  Dr. 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                         PLAN: Routine followup in one month. Patient instructed to call if questions or worsening complaints in interim.

## 2013-03-14 ENCOUNTER — Ambulatory Visit: Payer: Medicare Other | Attending: Orthopedic Surgery | Admitting: Physical Therapy

## 2013-03-14 DIAGNOSIS — C50919 Malignant neoplasm of unspecified site of unspecified female breast: Secondary | ICD-10-CM | POA: Insufficient documentation

## 2013-03-14 DIAGNOSIS — I890 Lymphedema, not elsewhere classified: Secondary | ICD-10-CM | POA: Insufficient documentation

## 2013-03-14 DIAGNOSIS — IMO0001 Reserved for inherently not codable concepts without codable children: Secondary | ICD-10-CM | POA: Insufficient documentation

## 2013-03-16 ENCOUNTER — Encounter: Payer: Self-pay | Admitting: Internal Medicine

## 2013-03-16 ENCOUNTER — Other Ambulatory Visit (INDEPENDENT_AMBULATORY_CARE_PROVIDER_SITE_OTHER): Payer: Medicare Other

## 2013-03-16 ENCOUNTER — Ambulatory Visit (INDEPENDENT_AMBULATORY_CARE_PROVIDER_SITE_OTHER): Payer: Medicare Other | Admitting: Internal Medicine

## 2013-03-16 VITALS — BP 106/64 | HR 76 | Temp 98.8°F | Resp 16 | Wt 218.0 lb

## 2013-03-16 DIAGNOSIS — IMO0001 Reserved for inherently not codable concepts without codable children: Secondary | ICD-10-CM

## 2013-03-16 DIAGNOSIS — E039 Hypothyroidism, unspecified: Secondary | ICD-10-CM

## 2013-03-16 DIAGNOSIS — L03119 Cellulitis of unspecified part of limb: Secondary | ICD-10-CM

## 2013-03-16 DIAGNOSIS — Z23 Encounter for immunization: Secondary | ICD-10-CM

## 2013-03-16 DIAGNOSIS — E785 Hyperlipidemia, unspecified: Secondary | ICD-10-CM

## 2013-03-16 LAB — CBC WITH DIFFERENTIAL/PLATELET
Basophils Absolute: 0.1 10*3/uL (ref 0.0–0.1)
Eosinophils Absolute: 0.3 10*3/uL (ref 0.0–0.7)
Lymphocytes Relative: 28.3 % (ref 12.0–46.0)
MCHC: 32.7 g/dL (ref 30.0–36.0)
MCV: 88 fl (ref 78.0–100.0)
Monocytes Absolute: 0.7 10*3/uL (ref 0.1–1.0)
Neutrophils Relative %: 55.6 % (ref 43.0–77.0)
Platelets: 320 10*3/uL (ref 150.0–400.0)
RBC: 3.94 Mil/uL (ref 3.87–5.11)
RDW: 15.5 % — ABNORMAL HIGH (ref 11.5–14.6)

## 2013-03-16 LAB — COMPREHENSIVE METABOLIC PANEL
ALT: 11 U/L (ref 0–35)
AST: 15 U/L (ref 0–37)
Albumin: 3.3 g/dL — ABNORMAL LOW (ref 3.5–5.2)
BUN: 14 mg/dL (ref 6–23)
Calcium: 9.6 mg/dL (ref 8.4–10.5)
Chloride: 104 mEq/L (ref 96–112)
Potassium: 4.1 mEq/L (ref 3.5–5.1)
Sodium: 141 mEq/L (ref 135–145)
Total Protein: 6.9 g/dL (ref 6.0–8.3)

## 2013-03-16 LAB — URINALYSIS, ROUTINE W REFLEX MICROSCOPIC
Bilirubin Urine: NEGATIVE
Hgb urine dipstick: NEGATIVE
Total Protein, Urine: NEGATIVE
Urine Glucose: NEGATIVE

## 2013-03-16 LAB — MICROALBUMIN / CREATININE URINE RATIO: Microalb, Ur: 0.4 mg/dL (ref 0.0–1.9)

## 2013-03-16 LAB — HEMOGLOBIN A1C: Hgb A1c MFr Bld: 6.1 % (ref 4.6–6.5)

## 2013-03-16 LAB — LIPID PANEL
HDL: 66.6 mg/dL (ref >39.00)
Triglycerides: 105 mg/dL (ref 0.0–149.0)

## 2013-03-16 MED ORDER — PRAVASTATIN SODIUM 40 MG PO TABS: 40.0000 mg | ORAL_TABLET | Freq: Every day | ORAL | Status: DC

## 2013-03-16 MED ORDER — PNEUMOCOCCAL 13-VAL CONJ VACC IM SUSP: 0.5000 mL | Freq: Once | INTRAMUSCULAR | Status: DC

## 2013-03-16 MED ORDER — SULFAMETHOXAZOLE-TRIMETHOPRIM 800-160 MG PO TABS: 1.0000 | ORAL_TABLET | Freq: Two times a day (BID) | ORAL | Status: DC

## 2013-03-16 MED ORDER — CEFUROXIME AXETIL 500 MG PO TABS: 500.0000 mg | ORAL_TABLET | Freq: Two times a day (BID) | ORAL | Status: DC

## 2013-03-16 MED ORDER — LEVOTHYROXINE SODIUM 150 MCG PO TABS: 150.0000 ug | ORAL_TABLET | Freq: Every day | ORAL | Status: DC

## 2013-03-16 NOTE — Assessment & Plan Note (Addendum)
I will check her TSH level and will adjust her dose if needed  Late note - TSH is up some so will increase the synthroid dose

## 2013-03-16 NOTE — Patient Instructions (Signed)
Cellulitis Cellulitis is an infection of the skin and the tissue beneath it. The infected area is usually red and tender. Cellulitis occurs most often in the arms and lower legs.  CAUSES  Cellulitis is caused by bacteria that enter the skin through cracks or cuts in the skin. The most common types of bacteria that cause cellulitis are Staphylococcus and Streptococcus. SYMPTOMS   Redness and warmth.  Swelling.  Tenderness or pain.  Fever. DIAGNOSIS  Your caregiver can usually determine what is wrong based on a physical exam. Blood tests may also be done. TREATMENT  Treatment usually involves taking an antibiotic medicine. HOME CARE INSTRUCTIONS   Take your antibiotics as directed. Finish them even if you start to feel better.  Keep the infected arm or leg elevated to reduce swelling.  Apply a warm cloth to the affected area up to 4 times per day to relieve pain.  Only take over-the-counter or prescription medicines for pain, discomfort, or fever as directed by your caregiver.  Keep all follow-up appointments as directed by your caregiver. SEEK MEDICAL CARE IF:   You notice red streaks coming from the infected area.  Your red area gets larger or turns dark in color.  Your bone or joint underneath the infected area becomes painful after the skin has healed.  Your infection returns in the same area or another area.  You notice a swollen bump in the infected area.  You develop new symptoms. SEEK IMMEDIATE MEDICAL CARE IF:   You have a fever.  You feel very sleepy.  You develop vomiting or diarrhea.  You have a general ill feeling (malaise) with muscle aches and pains. MAKE SURE YOU:   Understand these instructions.  Will watch your condition.  Will get help right away if you are not doing well or get worse. Document Released: 05/06/2005 Document Revised: 01/26/2012 Document Reviewed: 10/12/2011 Exit[employer] Patient Information 2014 [employer].  

## 2013-03-16 NOTE — Assessment & Plan Note (Signed)
Will cover staph with smx-tmp and strep with ceftin

## 2013-03-16 NOTE — Addendum Note (Signed)
Addended by: Etta Grandchild on: 03/16/2013 04:24 PM   Modules accepted: Orders, Medications

## 2013-03-16 NOTE — Progress Notes (Signed)
  Subjective:    Patient ID: Christy Harrell, female    DOB: 1940-08-19, 72 y.o.   MRN: [id number]  HPI  She comes in today and complains of an area of pain ,redness, swelling over her right lower medial leg. She does not recall a bite or injury.  Review of Systems  Constitutional: Positive for fatigue. Negative for fever, chills, diaphoresis, activity change, appetite change and unexpected weight change.  HENT: Negative.   Eyes: Negative.   Respiratory: Negative.  Negative for cough, chest tightness, shortness of breath, wheezing and stridor.   Cardiovascular: Negative.  Negative for chest pain, palpitations and leg swelling.  Gastrointestinal: Negative.  Negative for nausea, vomiting, abdominal pain, diarrhea, constipation and blood in stool.  Endocrine: Negative.   Musculoskeletal: Positive for back pain and arthralgias. Negative for myalgias, joint swelling and gait problem.  Skin: Negative.  Negative for color change, pallor, rash and wound.  Allergic/Immunologic: Negative.   Neurological: Negative.  Negative for dizziness, tremors, weakness and numbness.  Hematological: Negative.  Negative for adenopathy. Does not bruise/bleed easily.  Psychiatric/Behavioral: Negative.        Objective:   Physical Exam  Vitals reviewed. Constitutional: She is oriented to person, place, and time. She appears well-developed and well-nourished. No distress.  HENT:  Head: Normocephalic and atraumatic.  Mouth/Throat: Oropharynx is clear and moist. No oropharyngeal exudate.  Eyes: Conjunctivae are normal. Right eye exhibits no discharge. Left eye exhibits no discharge. No scleral icterus.  Neck: Normal range of motion. Neck supple. No JVD present. No tracheal deviation present. No thyromegaly present.  Cardiovascular: Normal rate, regular rhythm, normal heart sounds and intact distal pulses.  Exam reveals no gallop and no friction rub.   No murmur heard. Pulmonary/Chest: Effort normal and breath  sounds normal. No stridor. No respiratory distress. She has no wheezes. She has no rales. She exhibits no tenderness.  Abdominal: Soft. Bowel sounds are normal. She exhibits no distension and no mass. There is no tenderness. There is no rebound and no guarding.  Musculoskeletal: Normal range of motion. She exhibits edema (trace edema around both ankles). She exhibits no tenderness.  Lymphadenopathy:    She has no cervical adenopathy.  Neurological: She is oriented to person, place, and time.  Skin: Skin is warm and dry. No abrasion, no bruising, no burn, no ecchymosis, no laceration, no lesion, no petechiae and no rash noted. She is not diaphoretic. There is erythema. No pallor.     Psychiatric: She has a normal mood and affect. Her behavior is normal. Judgment and thought content normal.     Lab Results  Component Value Date   WBC 6.5 02/20/2013   HGB 11.2* 02/20/2013   HCT 35.2 02/20/2013   PLT 293 02/20/2013   GLUCOSE 96 02/20/2013   CHOL 255* 09/03/2011   TRIG 121.0 09/03/2011   HDL 67.60 09/03/2011   LDLDIRECT 174.5 09/03/2011   ALT 10 02/20/2013   AST 12 02/20/2013   NA 141 02/20/2013   K 4.0 02/20/2013   CL 105 01/23/2013   CREATININE 0.7 02/20/2013   BUN 12.1 02/20/2013   CO2 25 02/20/2013   TSH 11.87* 10/05/2012   HGBA1C 6.5 07/27/2012   MICROALBUR 1.8 02/03/2012       Assessment & Plan:

## 2013-03-16 NOTE — Assessment & Plan Note (Signed)
I will check her A1C and will monitor her renal function 

## 2013-03-16 NOTE — Assessment & Plan Note (Addendum)
I will check her FLP today and will advise further if needed  Late note - LDL is about 160 so I have asked her to start pravachol

## 2013-03-17 ENCOUNTER — Encounter: Payer: Self-pay | Admitting: Internal Medicine

## 2013-03-20 ENCOUNTER — Ambulatory Visit: Payer: Medicare Other | Admitting: Physician Assistant

## 2013-03-20 ENCOUNTER — Other Ambulatory Visit: Payer: Medicare Other | Admitting: Lab

## 2013-03-20 ENCOUNTER — Ambulatory Visit (INDEPENDENT_AMBULATORY_CARE_PROVIDER_SITE_OTHER): Payer: Medicare Other | Admitting: Internal Medicine

## 2013-03-20 ENCOUNTER — Encounter: Payer: Self-pay | Admitting: Internal Medicine

## 2013-03-20 ENCOUNTER — Other Ambulatory Visit (HOSPITAL_BASED_OUTPATIENT_CLINIC_OR_DEPARTMENT_OTHER): Payer: Medicare Other | Admitting: Lab

## 2013-03-20 ENCOUNTER — Other Ambulatory Visit: Payer: Medicare Other

## 2013-03-20 ENCOUNTER — Ambulatory Visit: Payer: Medicare Other

## 2013-03-20 ENCOUNTER — Ambulatory Visit (HOSPITAL_BASED_OUTPATIENT_CLINIC_OR_DEPARTMENT_OTHER): Payer: Medicare Other

## 2013-03-20 VITALS — BP 107/75 | HR 66 | Temp 98.3°F | Resp 20

## 2013-03-20 VITALS — BP 110/78 | HR 91 | Temp 97.8°F | Resp 16 | Wt 217.0 lb

## 2013-03-20 DIAGNOSIS — C7952 Secondary malignant neoplasm of bone marrow: Secondary | ICD-10-CM

## 2013-03-20 DIAGNOSIS — L03119 Cellulitis of unspecified part of limb: Secondary | ICD-10-CM

## 2013-03-20 DIAGNOSIS — C50019 Malignant neoplasm of nipple and areola, unspecified female breast: Secondary | ICD-10-CM

## 2013-03-20 DIAGNOSIS — C50911 Malignant neoplasm of unspecified site of right female breast: Secondary | ICD-10-CM

## 2013-03-20 DIAGNOSIS — L02419 Cutaneous abscess of limb, unspecified: Secondary | ICD-10-CM

## 2013-03-20 DIAGNOSIS — Z5111 Encounter for antineoplastic chemotherapy: Secondary | ICD-10-CM

## 2013-03-20 DIAGNOSIS — M899 Disorder of bone, unspecified: Secondary | ICD-10-CM

## 2013-03-20 LAB — COMPREHENSIVE METABOLIC PANEL (CC13)
ALT: 9 U/L (ref 0–55)
BUN: 12.6 mg/dL (ref 7.0–26.0)
CO2: 22 mEq/L (ref 22–29)
Calcium: 9.5 mg/dL (ref 8.4–10.4)
Chloride: 106 mEq/L (ref 98–109)
Creatinine: 0.9 mg/dL (ref 0.6–1.1)
Total Bilirubin: <0.2 mg/dL (ref 0.20–1.20)

## 2013-03-20 LAB — CBC WITH DIFFERENTIAL/PLATELET
BASO%: 0.5 % (ref 0.0–2.0)
Basophils Absolute: 0 10*3/uL (ref 0.0–0.1)
EOS%: 3.3 % (ref 0.0–7.0)
HCT: 35.6 % (ref 34.8–46.6)
HGB: 11.5 g/dL — ABNORMAL LOW (ref 11.6–15.9)
LYMPH%: 29.3 % (ref 14.0–49.7)
MCH: 28.2 pg (ref 25.1–34.0)
MCHC: 32.3 g/dL (ref 31.5–36.0)
MONO#: 0.6 10*3/uL (ref 0.1–0.9)
NEUT%: 56.4 % (ref 38.4–76.8)
Platelets: 259 10*3/uL (ref 145–400)
lymph#: 1.6 10*3/uL (ref 0.9–3.3)

## 2013-03-20 MED ORDER — SODIUM CHLORIDE 0.9 % IV SOLN
INTRAVENOUS | Status: DC
  Administered 2013-03-20: 12:00:00 via INTRAVENOUS

## 2013-03-20 MED ORDER — OXYCODONE HCL 5 MG PO TABS: ORAL_TABLET | ORAL | Status: DC

## 2013-03-20 MED ORDER — ZOLEDRONIC ACID 4 MG/100ML IV SOLN
4.0000 mg | Freq: Once | INTRAVENOUS | Status: AC
  Administered 2013-03-20: 4 mg via INTRAVENOUS
  Filled 2013-03-20: qty 100

## 2013-03-20 MED ORDER — FULVESTRANT 250 MG/5ML IM SOLN
500.0000 mg | Freq: Once | INTRAMUSCULAR | Status: AC
  Administered 2013-03-20: 500 mg via INTRAMUSCULAR
  Filled 2013-03-20: qty 10

## 2013-03-20 NOTE — Progress Notes (Signed)
  Subjective:    Patient ID: Christy Harrell, female    DOB: 06/09/1941, 72 y.o.   MRN: [id number]  Wound Check She was originally treated 3 to 5 days ago. Previous treatment included oral antibiotics. Her temperature was unmeasured prior to arrival. There has been no drainage from the wound. The redness has worsened. The swelling has worsened. The pain has worsened. She has no difficulty moving the affected extremity or digit.      Review of Systems  Constitutional: Negative.  Negative for fever, chills, diaphoresis, appetite change and fatigue.  HENT: Negative.   Eyes: Negative.   Respiratory: Negative.  Negative for cough, chest tightness, shortness of breath, wheezing and stridor.   Cardiovascular: Negative.  Negative for chest pain, palpitations and leg swelling.  Gastrointestinal: Negative for nausea, vomiting, abdominal pain, diarrhea, constipation and blood in stool.  Endocrine: Negative.   Genitourinary: Negative.   Musculoskeletal: Negative.   Skin: Positive for color change. Negative for pallor, rash and wound.  Allergic/Immunologic: Negative.   Neurological: Negative.  Negative for dizziness.  Hematological: Negative.  Negative for adenopathy. Does not bruise/bleed easily.  Psychiatric/Behavioral: Negative.        Objective:   Physical Exam  Vitals reviewed. Constitutional: She is oriented to person, place, and time. She appears well-developed and well-nourished. No distress.  HENT:  Head: Normocephalic and atraumatic.  Mouth/Throat: Oropharynx is clear and moist. No oropharyngeal exudate.  Eyes: Conjunctivae are normal. Right eye exhibits no discharge. Left eye exhibits no discharge. No scleral icterus.  Neck: Normal range of motion. Neck supple. No JVD present. No tracheal deviation present. No thyromegaly present.  Cardiovascular: Normal rate, regular rhythm, normal heart sounds and intact distal pulses.  Exam reveals no gallop and no friction rub.   No murmur  heard. Pulmonary/Chest: Effort normal and breath sounds normal. No stridor. No respiratory distress. She has no wheezes. She has no rales. She exhibits no tenderness.  Abdominal: Soft. Bowel sounds are normal. She exhibits no distension and no mass. There is no tenderness. There is no rebound and no guarding.  Musculoskeletal:       Legs: The area was cleaned with betadine then prepped and draped in sterile fashion. Local anesthesia was obtained with 2% lido with epi. 2 cc's were used. A 4 mm punch incision was made and a scant amt of purulent exudate was expressed and a small cavity was explored. She tolerated this well/no blood loss. It was packed with iodoform and a dressing was applied.  Lymphadenopathy:    She has no cervical adenopathy.  Neurological: She is oriented to person, place, and time.  Skin: Skin is warm and dry. No rash noted. She is not diaphoretic. There is erythema. No pallor.  Psychiatric: She has a normal mood and affect. Her behavior is normal. Judgment and thought content normal.          Assessment & Plan:

## 2013-03-20 NOTE — Patient Instructions (Addendum)
[facility] Discharge Instructions for Patients Receiving Chemotherapy  Zoledronic Acid injection (Hypercalcemia, Oncology) What is this medicine? ZOLEDRONIC ACID (ZOE le dron ik AS id) lowers the amount of calcium loss from bone. It is used to treat too much calcium in your blood from cancer. It is also used to prevent complications of cancer that has spread to the bone. This medicine may be used for other purposes; ask your health care provider or pharmacist if you have questions. What should I tell my health care provider before I take this medicine? They need to know if you have any of these conditions: -aspirin-sensitive asthma -dental disease -kidney disease -an unusual or allergic reaction to zoledronic acid, other medicines, foods, dyes, or preservatives -pregnant or trying to get pregnant -breast-feeding How should I use this medicine? This medicine is for infusion into a vein. It is given by a health care professional in a hospital or clinic setting. Talk to your pediatrician regarding the use of this medicine in children. Special care may be needed. Overdosage: If you think you have taken too much of this medicine contact a poison control center or emergency room at once. NOTE: This medicine is only for you. Do not share this medicine with others. What if I miss a dose? It is important not to miss your dose. Call your doctor or health care professional if you are unable to keep an appointment. What may interact with this medicine? -certain antibiotics given by injection -NSAIDs, medicines for pain and inflammation, like ibuprofen or naproxen -some diuretics like bumetanide, furosemide -teriparatide -thalidomide This list may not describe all possible interactions. Give your health care provider a list of all the medicines, herbs, non-prescription drugs, or dietary supplements you use. Also tell them if you smoke, drink alcohol, or use illegal drugs. Some items may  interact with your medicine. What should I watch for while using this medicine? Visit your doctor or health care professional for regular checkups. It may be some time before you see the benefit from this medicine. Do not stop taking your medicine unless your doctor tells you to. Your doctor may order blood tests or other tests to see how you are doing. Women should inform their doctor if they wish to become pregnant or think they might be pregnant. There is a potential for serious side effects to an unborn child. Talk to your health care professional or pharmacist for more information. You should make sure that you get enough calcium and vitamin D while you are taking this medicine. Discuss the foods you eat and the vitamins you take with your health care professional. Some people who take this medicine have severe bone, joint, and/or muscle pain. This medicine may also increase your risk for a broken thigh bone. Tell your doctor right away if you have pain in your upper leg or groin. Tell your doctor if you have any pain that does not go away or that gets worse. What side effects may I notice from receiving this medicine? Side effects that you should report to your doctor or health care professional as soon as possible: -allergic reactions like skin rash, itching or hives, swelling of the face, lips, or tongue -anxiety, confusion, or depression -breathing problems -changes in vision -feeling faint or lightheaded, falls -jaw burning, cramping, pain -muscle cramps, stiffness, or weakness -trouble passing urine or change in the amount of urine Side effects that usually do not require medical attention (report to your doctor or health care professional  if they continue or are bothersome): -bone, joint, or muscle pain -fever -hair loss -irritation at site where injected -loss of appetite -nausea, vomiting -stomach upset -tired This list may not describe all possible side effects. Call your  doctor for medical advice about side effects. You may report side effects to FDA at 1-800-FDA-1088. Where should I keep my medicine? This drug is given in a hospital or clinic and will not be stored at home. NOTE: This sheet is a summary. It may not cover all possible information. If you have questions about this medicine, talk to your doctor, pharmacist, or health care provider.  2013, [employer] Standard. (01/23/2011 9:06:58 AM)   To help prevent nausea and vomiting after your treatment, we encourage you to take your nausea medication as directed by your physician.   If you develop nausea and vomiting that is not controlled by your nausea medication, call the clinic.   BELOW ARE SYMPTOMS THAT SHOULD BE REPORTED IMMEDIATELY:  *FEVER GREATER THAN 100.5 F  *CHILLS WITH OR WITHOUT FEVER  NAUSEA AND VOMITING THAT IS NOT CONTROLLED WITH YOUR NAUSEA MEDICATION  *UNUSUAL SHORTNESS OF BREATH  *UNUSUAL BRUISING OR BLEEDING  TENDERNESS IN MOUTH AND THROAT WITH OR WITHOUT PRESENCE OF ULCERS  *URINARY PROBLEMS  *BOWEL PROBLEMS  UNUSUAL RASH Items with * indicate a potential emergency and should be followed up as soon as possible.  Feel free to call the clinic you have any questions or concerns. The clinic phone number is [phone].

## 2013-03-20 NOTE — Patient Instructions (Signed)

## 2013-03-20 NOTE — Assessment & Plan Note (Addendum)
I and D was completed, clx was sent She will keep her right leg elevated and will continue the antibiotics I have asked her to come back in 2-3 days for a packing removal She will take oxycodone for pain

## 2013-03-21 ENCOUNTER — Ambulatory Visit: Payer: Medicare Other | Admitting: Physical Therapy

## 2013-03-22 ENCOUNTER — Ambulatory Visit (INDEPENDENT_AMBULATORY_CARE_PROVIDER_SITE_OTHER): Payer: Medicare Other | Admitting: Internal Medicine

## 2013-03-22 VITALS — BP 120/80 | HR 75 | Temp 98.3°F | Resp 16 | Wt 215.0 lb

## 2013-03-22 DIAGNOSIS — L02419 Cutaneous abscess of limb, unspecified: Secondary | ICD-10-CM

## 2013-03-22 DIAGNOSIS — I8311 Varicose veins of right lower extremity with inflammation: Secondary | ICD-10-CM

## 2013-03-22 DIAGNOSIS — I8310 Varicose veins of unspecified lower extremity with inflammation: Secondary | ICD-10-CM

## 2013-03-22 DIAGNOSIS — I872 Venous insufficiency (chronic) (peripheral): Secondary | ICD-10-CM | POA: Insufficient documentation

## 2013-03-22 DIAGNOSIS — L03119 Cellulitis of unspecified part of limb: Secondary | ICD-10-CM

## 2013-03-22 MED ORDER — VASCULERA PO TABS: 1.0000 | ORAL_TABLET | Freq: Every day | ORAL | Status: DC

## 2013-03-22 NOTE — Progress Notes (Signed)
  Subjective:    Patient ID: Christy Harrell, female    DOB: 30-Jan-1941, 72 y.o.   MRN: [id number]  Wound Check She was originally treated 2 to 3 days ago. Previous treatment included I&D of abscess and oral antibiotics. Her temperature was unmeasured prior to arrival. There has been no drainage from the wound. The redness has improved. The swelling has improved. The pain has improved. She has no difficulty moving the affected extremity or digit.      Review of Systems  Constitutional: Negative.  Negative for fever, chills, diaphoresis, appetite change and fatigue.  HENT: Negative.   Eyes: Negative.   Respiratory: Negative.  Negative for cough, chest tightness, shortness of breath, wheezing and stridor.   Cardiovascular: Negative.  Negative for chest pain, palpitations and leg swelling.  Gastrointestinal: Negative.   Endocrine: Negative.   Genitourinary: Negative.   Musculoskeletal: Negative.   Skin: Positive for wound. Negative for pallor and rash.  Allergic/Immunologic: Negative.   Neurological: Negative.   Hematological: Negative.  Negative for adenopathy. Does not bruise/bleed easily.  Psychiatric/Behavioral: Negative.        Objective:   Physical Exam  Vitals reviewed. Constitutional: She is oriented to person, place, and time. She appears well-developed and well-nourished. No distress.  HENT:  Head: Normocephalic and atraumatic.  Mouth/Throat: No oropharyngeal exudate.  Eyes: Conjunctivae are normal. Right eye exhibits no discharge. Left eye exhibits no discharge. No scleral icterus.  Neck: Normal range of motion. Neck supple. No JVD present. No tracheal deviation present. No thyromegaly present.  Cardiovascular: Normal rate, regular rhythm, normal heart sounds and intact distal pulses.  Exam reveals no gallop and no friction rub.   No murmur heard. Pulmonary/Chest: Effort normal and breath sounds normal. No stridor. No respiratory distress. She has no wheezes. She has no  rales. She exhibits no tenderness.  Abdominal: Soft. Bowel sounds are normal. She exhibits no distension and no mass. There is no tenderness. There is no rebound and no guarding.  Musculoskeletal: Normal range of motion. She exhibits edema (1+ pitting edema in BLE). She exhibits no tenderness.       Right lower leg: She exhibits deformity. She exhibits no tenderness, no bony tenderness, no swelling, no edema and no laceration.       Legs: The packing is removed, there is no exudate. The degree of warmth, swelling, erythema is unchanged, theres is no exudate, induration, fluctuance.  Lymphadenopathy:    She has no cervical adenopathy.  Neurological: She is oriented to person, place, and time.  Skin: Skin is warm and dry. No rash noted. She is not diaphoretic. There is erythema. No pallor.     Lab Results  Component Value Date   WBC 5.5 03/20/2013   HGB 11.5* 03/20/2013   HCT 35.6 03/20/2013   PLT 259 03/20/2013   GLUCOSE 114 03/20/2013   CHOL 239* 03/16/2013   TRIG 105.0 03/16/2013   HDL 66.60 03/16/2013   LDLDIRECT 160.0 03/16/2013   ALT 9 03/20/2013   AST 16 03/20/2013   NA 138 03/20/2013   K 4.3 03/20/2013   CL 104 03/16/2013   CREATININE 0.9 03/20/2013   BUN 12.6 03/20/2013   CO2 22 03/20/2013   TSH 9.26* 03/16/2013   HGBA1C 6.1 03/16/2013   MICROALBUR 0.4 03/16/2013       Assessment & Plan:

## 2013-03-22 NOTE — Patient Instructions (Signed)
Venous Stasis and Chronic Venous Insufficiency  As people age, the veins located in their legs may weaken and stretch. When veins weaken and lose the ability to pump blood effectively, the condition is called chronic venous insufficiency (CVI) or venous stasis. Almost all veins return blood back to the heart. This happens by:   The force of the heart pumping fresh blood pushes blood back to the heart.   Blood flowing to the heart from the force of gravity.  In the deep veins of the legs, blood has to fight gravity and flow upstream back to the heart. Here, the leg muscles contract to pump blood back toward the heart. Vein walls are elastic, and many veins have small valves that only allow blood to flow in one direction. When leg muscles contract, they push inward against the elastic vein walls. This squeezes blood upward, opens the valves, and moves blood toward the heart. When leg muscles relax, the vein wall also relaxes and the valves inside the vein close to prevent blood from flowing backward. This method of pumping blood out of the legs is called the venous pump.  CAUSES   The venous pump works best while walking and leg muscles are contracting. But when a person sits or stands, blood pressure in leg veins can build. Deep veins are usually able to withstand short periods of inactivity, but long periods of inactivity (and increased pressure) can stretch, weaken, and damage vein walls. High blood pressure can also stretch and damage vein walls. The veins may no longer be able to pump blood back to the heart. Venous hypertension (high blood pressure inside veins) that lasts over time is a primary cause of CVI. CVI can also be caused by:    Deep vein thrombosis, a condition where a thrombus (blood clot) blocks blood flow in a vein.   Phlebitis, an inflammation of a superficial vein that causes a blood clot to form.  Other risk factors for CVI may include:    Heredity.   Obesity.   Pregnancy.    Sedentary lifestyle.   Smoking.   Jobs requiring long periods of standing or sitting in one place.   Age and gender:   Women in their 40's and 50's and men in their 70's are more prone to developing CVI.  SYMPTOMS   Symptoms of CVI may include:    Varicose veins.   Ulceration or skin breakdown.   Lipodermatosclerosis, a condition that affects the skin just above the ankle, usually on the inside surface. Over time the skin becomes brown, smooth, tight and often painful. Those with this condition have a high risk of developing skin ulcers.   Reddened or discolored skin on the leg.   Swelling.  DIAGNOSIS   Your caregiver can diagnose CVI after performing a careful medical history and physical examination. To confirm the diagnosis, the following tests may also be ordered:    Duplex ultrasound.   Plethysmography (tests blood flow).   Venograms (x-ray using a special dye).  TREATMENT  The goals of treatment for CVI are to restore a person to an active life and to minimize pain or disability. Typically, CVI does not pose a serious threat to life or limb, and with proper treatment most people with this condition can continue to lead active lives. In most cases, mild CVI can be treated on an outpatient basis with simple procedures. Treatment methods include:    Elastic compression socks.   Sclerotherapy, a procedure involving an injection of   a material that "dissolves" the damaged veins. Other veins in the network of blood vessels take over the function of the damaged veins.   Vein stripping (an older procedure less commonly used).   Laser Ablation surgery.   Valve repair.  HOME CARE INSTRUCTIONS    Elastic compression socks must be worn every day. They can help with symptoms and lower the chances of the problem getting worse, but they do not cure the problem.   Only take over-the-counter or prescription medicines for pain, discomfort, or fever as directed by your caregiver.    Your caregiver will review your other medications with you.  SEEK MEDICAL CARE IF:    You are confused about how to take your medications.   There is redness, swelling, or increasing pain in the affected area.   There is a red streak or line that extends up or down from the affected area.   There is a breakdown or loss of skin in the affected area, even if the breakdown is small.   You develop an unexplained oral temperature above 102 F (38.9 C).   There is an injury to the affected area.  SEEK IMMEDIATE MEDICAL CARE IF:    There is an injury and open wound to the affected area.   Pain is not adequately relieved with pain medication prescribed or becomes severe.   An oral temperature above 102 F (38.9 C) develops.   The foot/ankle below the affected area becomes suddenly numb or the area feels weak and hard to move.  MAKE SURE YOU:    Understand these instructions.   Will watch your condition.   Will get help right away if you are not doing well or get worse.  Document Released: 11/30/2006 Document Revised: 10/19/2011 Document Reviewed: 02/07/2007  [employer] Patient Information 2014 [employer].

## 2013-03-23 ENCOUNTER — Encounter: Payer: Self-pay | Admitting: Internal Medicine

## 2013-03-23 LAB — WOUND CULTURE: Organism ID, Bacteria: NO GROWTH

## 2013-03-23 NOTE — Assessment & Plan Note (Signed)
Start vasculera

## 2013-03-23 NOTE — Assessment & Plan Note (Signed)
The culture is neg She is about the same I am concerned that she may have venous stasis and be at risk for stasis ulcer so I have asked her to start vasculera

## 2013-03-28 ENCOUNTER — Ambulatory Visit: Payer: Medicare Other | Admitting: Physical Therapy

## 2013-04-04 ENCOUNTER — Ambulatory Visit: Payer: Medicare Other | Admitting: Physical Therapy

## 2013-04-11 ENCOUNTER — Encounter: Payer: Medicare Other | Admitting: Physical Therapy

## 2013-04-17 ENCOUNTER — Other Ambulatory Visit (HOSPITAL_BASED_OUTPATIENT_CLINIC_OR_DEPARTMENT_OTHER): Payer: Medicare Other | Admitting: Lab

## 2013-04-17 ENCOUNTER — Ambulatory Visit (HOSPITAL_BASED_OUTPATIENT_CLINIC_OR_DEPARTMENT_OTHER): Payer: Medicare Other

## 2013-04-17 ENCOUNTER — Ambulatory Visit: Payer: Medicare Other

## 2013-04-17 VITALS — BP 112/50 | HR 90 | Temp 98.0°F | Resp 20

## 2013-04-17 DIAGNOSIS — C50919 Malignant neoplasm of unspecified site of unspecified female breast: Secondary | ICD-10-CM

## 2013-04-17 DIAGNOSIS — C7951 Secondary malignant neoplasm of bone: Secondary | ICD-10-CM

## 2013-04-17 DIAGNOSIS — M899 Disorder of bone, unspecified: Secondary | ICD-10-CM

## 2013-04-17 DIAGNOSIS — C50911 Malignant neoplasm of unspecified site of right female breast: Secondary | ICD-10-CM

## 2013-04-17 DIAGNOSIS — Z5111 Encounter for antineoplastic chemotherapy: Secondary | ICD-10-CM

## 2013-04-17 LAB — CBC WITH DIFFERENTIAL/PLATELET
Eosinophils Absolute: 0.1 10*3/uL (ref 0.0–0.5)
HCT: 36.2 % (ref 34.8–46.6)
LYMPH%: 24.1 % (ref 14.0–49.7)
MONO#: 0.6 10*3/uL (ref 0.1–0.9)
NEUT#: 4.1 10*3/uL (ref 1.5–6.5)
NEUT%: 64.9 % (ref 38.4–76.8)
Platelets: 291 10*3/uL (ref 145–400)
RBC: 4.26 10*6/uL (ref 3.70–5.45)
WBC: 6.2 10*3/uL (ref 3.9–10.3)
lymph#: 1.5 10*3/uL (ref 0.9–3.3)

## 2013-04-17 LAB — COMPREHENSIVE METABOLIC PANEL (CC13)
CO2: 25 mEq/L (ref 22–29)
Calcium: 9.6 mg/dL (ref 8.4–10.4)
Chloride: 108 mEq/L (ref 98–109)
Glucose: 153 mg/dl — ABNORMAL HIGH (ref 70–140)
Sodium: 142 mEq/L (ref 136–145)
Total Bilirubin: 0.29 mg/dL (ref 0.20–1.20)
Total Protein: 7.2 g/dL (ref 6.4–8.3)

## 2013-04-17 MED ORDER — ZOLEDRONIC ACID 4 MG/100ML IV SOLN
4.0000 mg | Freq: Once | INTRAVENOUS | Status: AC
  Administered 2013-04-17: 4 mg via INTRAVENOUS
  Filled 2013-04-17: qty 100

## 2013-04-17 MED ORDER — FULVESTRANT 250 MG/5ML IM SOLN
500.0000 mg | Freq: Once | INTRAMUSCULAR | Status: AC
  Administered 2013-04-17: 500 mg via INTRAMUSCULAR
  Filled 2013-04-17: qty 10

## 2013-04-17 NOTE — Patient Instructions (Signed)
Fulvestrant injection What is this medicine? FULVESTRANT (ful VES trant) blocks the effects of estrogen. It is used to treat breast cancer in women past the age of menopause. This medicine may be used for other purposes; ask your health care provider or pharmacist if you have questions. What should I tell my health care provider before I take this medicine? They need to know if you have any of these conditions: -bleeding problems -liver disease -low levels of platelets in the blood -an unusual or allergic reaction to fulvestrant, other medicines, foods, dyes, or preservatives -pregnant or trying to get pregnant -breast-feeding How should I use this medicine? This medicine is for injection into a muscle. It is usually given by a health care professional in a hospital or clinic setting. Talk to your pediatrician regarding the use of this medicine in children. Special care may be needed. Overdosage: If you think you have taken too much of this medicine contact a poison control center or emergency room at once. NOTE: This medicine is only for you. Do not share this medicine with others. What if I miss a dose? It is important not to miss your dose. Call your doctor or health care professional if you are unable to keep an appointment. What may interact with this medicine? -medicines that treat or prevent blood clots like warfarin, enoxaparin, and dalteparin This list may not describe all possible interactions. Give your health care provider a list of all the medicines, herbs, non-prescription drugs, or dietary supplements you use. Also tell them if you smoke, drink alcohol, or use illegal drugs. Some items may interact with your medicine. What should I watch for while using this medicine? Your condition will be monitored carefully while you are receiving this medicine. You will need important blood work done while you are taking this medicine. Do not become pregnant while taking this medicine.  Women should inform their doctor if they wish to become pregnant or think they might be pregnant. There is a potential for serious side effects to an unborn child. Talk to your health care professional or pharmacist for more information. What side effects may I notice from receiving this medicine? Side effects that you should report to your doctor or health care professional as soon as possible: -allergic reactions like skin rash, itching or hives, swelling of the face, lips, or tongue -feeling faint or lightheaded, falls -fever or flu-like symptoms -sore throat -vaginal bleeding Side effects that usually do not require medical attention (report to your doctor or health care professional if they continue or are bothersome): -aches, pains -constipation or diarrhea -headache -hot flashes -nausea, vomiting -pain at site where injected -stomach pain This list may not describe all possible side effects. Call your doctor for medical advice about side effects. You may report side effects to FDA at 1-800-FDA-1088. Where should I keep my medicine? This drug is given in a hospital or clinic and will not be stored at home. NOTE: This sheet is a summary. It may not cover all possible information. If you have questions about this medicine, talk to your doctor, pharmacist, or health care provider.  2013, [employer]. (12/05/2007 3:39:24 PM) Zoledronic Acid injection (Hypercalcemia, Oncology) What is this medicine? ZOLEDRONIC ACID (ZOE le dron ik AS id) lowers the amount of calcium loss from bone. It is used to treat too much calcium in your blood from cancer. It is also used to prevent complications of cancer that has spread to the bone. This medicine may be used for   other purposes; ask your health care provider or pharmacist if you have questions. What should I tell my health care provider before I take this medicine? They need to know if you have any of these conditions: -aspirin-sensitive  asthma -dental disease -kidney disease -an unusual or allergic reaction to zoledronic acid, other medicines, foods, dyes, or preservatives -pregnant or trying to get pregnant -breast-feeding How should I use this medicine? This medicine is for infusion into a vein. It is given by a health care professional in a hospital or clinic setting. Talk to your pediatrician regarding the use of this medicine in children. Special care may be needed. Overdosage: If you think you have taken too much of this medicine contact a poison control center or emergency room at once. NOTE: This medicine is only for you. Do not share this medicine with others. What if I miss a dose? It is important not to miss your dose. Call your doctor or health care professional if you are unable to keep an appointment. What may interact with this medicine? -certain antibiotics given by injection -NSAIDs, medicines for pain and inflammation, like ibuprofen or naproxen -some diuretics like bumetanide, furosemide -teriparatide -thalidomide This list may not describe all possible interactions. Give your health care provider a list of all the medicines, herbs, non-prescription drugs, or dietary supplements you use. Also tell them if you smoke, drink alcohol, or use illegal drugs. Some items may interact with your medicine. What should I watch for while using this medicine? Visit your doctor or health care professional for regular checkups. It may be some time before you see the benefit from this medicine. Do not stop taking your medicine unless your doctor tells you to. Your doctor may order blood tests or other tests to see how you are doing. Women should inform their doctor if they wish to become pregnant or think they might be pregnant. There is a potential for serious side effects to an unborn child. Talk to your health care professional or pharmacist for more information. You should make sure that you get enough calcium and  vitamin D while you are taking this medicine. Discuss the foods you eat and the vitamins you take with your health care professional. Some people who take this medicine have severe bone, joint, and/or muscle pain. This medicine may also increase your risk for a broken thigh bone. Tell your doctor right away if you have pain in your upper leg or groin. Tell your doctor if you have any pain that does not go away or that gets worse. What side effects may I notice from receiving this medicine? Side effects that you should report to your doctor or health care professional as soon as possible: -allergic reactions like skin rash, itching or hives, swelling of the face, lips, or tongue -anxiety, confusion, or depression -breathing problems -changes in vision -feeling faint or lightheaded, falls -jaw burning, cramping, pain -muscle cramps, stiffness, or weakness -trouble passing urine or change in the amount of urine Side effects that usually do not require medical attention (report to your doctor or health care professional if they continue or are bothersome): -bone, joint, or muscle pain -fever -hair loss -irritation at site where injected -loss of appetite -nausea, vomiting -stomach upset -tired This list may not describe all possible side effects. Call your doctor for medical advice about side effects. You may report side effects to FDA at 1-800-FDA-1088. Where should I keep my medicine? This drug is given in a hospital or clinic   and will not be stored at home. NOTE: This sheet is a summary. It may not cover all possible information. If you have questions about this medicine, talk to your doctor, pharmacist, or health care provider.  2013, [employer]. (01/23/2011 9:06:58 AM)  

## 2013-04-18 ENCOUNTER — Ambulatory Visit: Payer: Medicare Other | Attending: Orthopedic Surgery | Admitting: Physical Therapy

## 2013-04-18 DIAGNOSIS — C50919 Malignant neoplasm of unspecified site of unspecified female breast: Secondary | ICD-10-CM | POA: Insufficient documentation

## 2013-04-18 DIAGNOSIS — IMO0001 Reserved for inherently not codable concepts without codable children: Secondary | ICD-10-CM | POA: Insufficient documentation

## 2013-04-18 DIAGNOSIS — I890 Lymphedema, not elsewhere classified: Secondary | ICD-10-CM | POA: Insufficient documentation

## 2013-04-25 ENCOUNTER — Ambulatory Visit: Payer: Medicare Other | Admitting: Physical Therapy

## 2013-04-28 ENCOUNTER — Encounter: Payer: Self-pay | Admitting: Radiation Oncology

## 2013-05-02 ENCOUNTER — Ambulatory Visit
Admission: RE | Admit: 2013-05-02 | Discharge: 2013-05-02 | Disposition: A | Discharge status: Home / Self Care | Payer: Medicare Other | Source: Ambulatory Visit | Attending: Radiation Oncology | Admitting: Radiation Oncology

## 2013-05-02 ENCOUNTER — Encounter: Payer: Self-pay | Admitting: Radiation Oncology

## 2013-05-02 VITALS — BP 113/68 | HR 75 | Temp 98.2°F | Resp 20 | Wt 215.7 lb

## 2013-05-02 DIAGNOSIS — C7951 Secondary malignant neoplasm of bone: Secondary | ICD-10-CM

## 2013-05-02 NOTE — Progress Notes (Signed)
  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 She continues with physical therapy and has marked improvement of her left shoulder range of motion. She tells me she had recent x-rays with Dr. Ave Filter and was given a good report. She is scheduled for a bone scan in late September. She continues with her zoledronic acid and fulvestrant under the direction of Dr. Darnelle Catalan  Physical examination: Alert and oriented. She is in good spirits. Filed Vitals:   05/02/13 1143  BP: 113/68  Pulse: 75  Temp: 98.2 F (36.8 C)  Resp: 20   There is no palpable discomfort along the proximal left humerus. She has left shoulder extension to approximately 90. There is right upper extremity lymphedema for which she wears a sleeve/glove and this is essentially unchanged.  Impression: Satisfactory progress.  Plan: I've not scheduled Ms. Haas for formal followup visit knowing that she be followed by Dr. Darnelle Catalan and Dr. Ave Filter.

## 2013-05-02 NOTE — Progress Notes (Signed)
Follow up s/p rad tx left humerus, patient is going much better, still going to physical therapy weekly, has appt tomorrow, using exercises and raising left arm up almost all the way, does exercises on both arms,  at home wearing lymphedema  sleve and glove on right arm, , , sees Dr.Magrinat 05/08/13 , ran out of her fentanyl patch, occasional pain when waking up in am left shoulder, u 11:51 AM

## 2013-05-03 ENCOUNTER — Ambulatory Visit: Payer: Medicare Other | Admitting: Physical Therapy

## 2013-05-08 ENCOUNTER — Ambulatory Visit (HOSPITAL_COMMUNITY)
Admission: RE | Admit: 2013-05-08 | Discharge: 2013-05-08 | Disposition: A | Discharge status: Home / Self Care | Payer: Medicare Other | Source: Ambulatory Visit | Attending: Physician Assistant | Admitting: Physician Assistant

## 2013-05-08 ENCOUNTER — Encounter (HOSPITAL_COMMUNITY)
Admission: RE | Admit: 2013-05-08 | Discharge: 2013-05-08 | Disposition: A | Discharge status: Home / Self Care | Payer: Medicare Other | Source: Ambulatory Visit | Attending: Physician Assistant | Admitting: Physician Assistant

## 2013-05-08 DIAGNOSIS — I10 Essential (primary) hypertension: Secondary | ICD-10-CM | POA: Insufficient documentation

## 2013-05-08 DIAGNOSIS — C50919 Malignant neoplasm of unspecified site of unspecified female breast: Secondary | ICD-10-CM | POA: Insufficient documentation

## 2013-05-08 DIAGNOSIS — M25519 Pain in unspecified shoulder: Secondary | ICD-10-CM | POA: Insufficient documentation

## 2013-05-08 DIAGNOSIS — I700 Atherosclerosis of aorta: Secondary | ICD-10-CM | POA: Insufficient documentation

## 2013-05-08 DIAGNOSIS — E119 Type 2 diabetes mellitus without complications: Secondary | ICD-10-CM | POA: Insufficient documentation

## 2013-05-08 MED ORDER — TECHNETIUM TC 99M MEDRONATE IV KIT
26.6000 | PACK | Freq: Once | INTRAVENOUS | Status: AC | PRN
  Administered 2013-05-08: 26.6 via INTRAVENOUS

## 2013-05-09 ENCOUNTER — Ambulatory Visit (HOSPITAL_BASED_OUTPATIENT_CLINIC_OR_DEPARTMENT_OTHER): Payer: Medicare Other | Admitting: Oncology

## 2013-05-09 ENCOUNTER — Other Ambulatory Visit (HOSPITAL_BASED_OUTPATIENT_CLINIC_OR_DEPARTMENT_OTHER): Payer: Medicare Other | Admitting: Lab

## 2013-05-09 VITALS — BP 146/85 | HR 70 | Temp 98.6°F | Resp 20 | Ht 60.0 in | Wt 215.4 lb

## 2013-05-09 DIAGNOSIS — C7951 Secondary malignant neoplasm of bone: Secondary | ICD-10-CM

## 2013-05-09 DIAGNOSIS — Z170 Estrogen receptor positive status [ER+]: Secondary | ICD-10-CM | POA: Insufficient documentation

## 2013-05-09 DIAGNOSIS — C50519 Malignant neoplasm of lower-outer quadrant of unspecified female breast: Secondary | ICD-10-CM

## 2013-05-09 DIAGNOSIS — C50512 Malignant neoplasm of lower-outer quadrant of left female breast: Secondary | ICD-10-CM

## 2013-05-09 DIAGNOSIS — D0511 Intraductal carcinoma in situ of right breast: Secondary | ICD-10-CM | POA: Insufficient documentation

## 2013-05-09 LAB — CBC WITH DIFFERENTIAL/PLATELET
Basophils Absolute: 0 10*3/uL (ref 0.0–0.1)
EOS%: 3.4 % (ref 0.0–7.0)
Eosinophils Absolute: 0.2 10*3/uL (ref 0.0–0.5)
HCT: 35.2 % (ref 34.8–46.6)
HGB: 11.6 g/dL (ref 11.6–15.9)
LYMPH%: 27.8 % (ref 14.0–49.7)
MCH: 27.7 pg (ref 25.1–34.0)
MCV: 84.1 fL (ref 79.5–101.0)
MONO%: 10.3 % (ref 0.0–14.0)
NEUT#: 3.4 10*3/uL (ref 1.5–6.5)
NEUT%: 57.9 % (ref 38.4–76.8)
Platelets: 317 10*3/uL (ref 145–400)
RDW: 15.2 % — ABNORMAL HIGH (ref 11.2–14.5)

## 2013-05-09 LAB — COMPREHENSIVE METABOLIC PANEL (CC13)
AST: 14 U/L (ref 5–34)
Albumin: 2.8 g/dL — ABNORMAL LOW (ref 3.5–5.0)
Alkaline Phosphatase: 56 U/L (ref 40–150)
BUN: 10.3 mg/dL (ref 7.0–26.0)
Calcium: 9.4 mg/dL (ref 8.4–10.4)
Creatinine: 0.8 mg/dL (ref 0.6–1.1)
Glucose: 115 mg/dl (ref 70–140)
Potassium: 4.1 mEq/L (ref 3.5–5.1)
Sodium: 143 mEq/L (ref 136–145)
Total Protein: 6.9 g/dL (ref 6.4–8.3)

## 2013-05-09 NOTE — Progress Notes (Signed)
ID: Christy Harrell   DOB: [birth year]  MR#: [id number]  CSN#:[id number]  PCP: Sanda Linger, MD GYN:  SU:  OTHER MD:  Claudette Laws, Gean Birchwood, Marcelyn Bruins   HISTORY OF PRESENT ILLNESS: The patient had routine screening mammography 10/16/2010 showing a lobulated mass in the right subareolar region measuring up to 5.5 cm. The Left breast showed some central microcalcifications. Biopsy of the Right breast mass on 10/28/2010 showed an invasive ductal carcinoma, grade 2, estrogen receptor positive (Allred score 8), progesterone receptor positive (Allred score 5), with an equivocal HER-2. The Left breast area of microcalcifications was biopsied at the same time, and was read as suspicious for DCIS.  On 11/17/2010 the patient underwent Right lumpectomy and axillary lymph node dissection for what proved to be a 3 cm invasive ductal carcinoma, grade 2, with some papillary and mucinous features. One of 16 lymph nodes was involved. FISH showed no HER-2 amplification. Left breast biopsy was benign.  The patient had an Oncotype sent, with a score of 27, predicting a risk of distant recurrence after 5 years of tamoxifen in the 18% range. With this intermediate result, the decision was made not to proceed with chemotherapy, since the patient is the sole caregiver to her very elderly mother. Instead the patient proceeded to radiation treatment which was completed 03/06/2011. She started anastrozole at that point. Her subsequent history is as detailed below   INTERVAL HISTORY: [name] returns today for followup of her metastatic breast carcinoma with known bone involvement. She just had restaging studies which show no definite evidence of active cancer. She continues on fulvestrant and zolendronate with good tolerance  REVIEW OF SYSTEMS: The patient tolerated radiation well other than for some desquamation chiefly in the axillae. She's had some itching in the medial left upper arm,  where she has been using a uterine. She has mild insomnia problems. She has occasional muscle aches and pains including lower back pain, where she has known arthritis problems. These discomforts are not more intense or persistent than usual. Sometimes her ankles swell. She tells me her diabetes is well controlled. Her right lymphedema continues to be a major concern but unfortunately we do not have a definitive solution for that. She is receiving rehabilitation to the left upper extremity, which has limited range of motion. The patient has not been to see a dentist recently and understands that she is at risk for osteonecrosis of the jaw, so she needs to see a dentist. She is going to ask her primary care physician for a referral. She is not exercising regularly. A detailed review of systems was otherwise stable   PAST MEDICAL HISTORY: Past Medical History  Diagnosis Date  . Cancer     right breast  . Club foot   . Arthritis   . Thyroid disease   . Hyperlipidemia   . Diabetes mellitus   . Osteoporosis   . Hypertension   . GERD (gastroesophageal reflux disease)     watches diet  . Neuromuscular disorder     lt arm numb sometimes  . Wears glasses   . 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 History of radiation therapy 02/22/13- 03/13/13    left proximal humerus 3500 cGy 14 sessions  . Lymphedema     right arm    PAST SURGICAL HISTORY: Past Surgical History  Procedure Laterality Date  . Cesarean section    . Thyroidectomy  2002  . Foot surgery  as a child for club foot  . Breast surgery  2012    rt lump-16 nodes-in [place]  . Cesarean section    . Colonoscopy    . Dilation and curettage of uterus    . Humerus im nail Left 12/27/2012    Procedure: INTRAMEDULLARY (IM) NAIL HUMERAL LEFT PATHOLOGIC FRACTURE;  Surgeon: Mable Paris, MD;  Location:  [facility];  Service: Orthopedics;  Laterality: Left;    FAMILY HISTORY Family History   Problem Relation Age of Onset  . Cancer Neg Hx   . Arthritis Neg Hx   . Heart disease Neg Hx   . Hyperlipidemia Neg Hx   . Hypertension Neg Hx   . Dementia Mother   . Hearing loss Mother    The patient's father died from unknown causes. The patient's mother is alive at age 66. The patient was a single child. There is no history of breast or ovarian cancer in the family to her knowledge.  GYNECOLOGIC HISTORY: Menarche age 76, menopause in her early 65s. The patient is GX P1, first pregnancy to term age 15. She did not take hormone replacement  SOCIAL HISTORY: The patient grew up in [place], attended [employer] high school, then moved to [place] where she lived about 40 years. 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 Her mother, Christy Harrell, 99, lives with the patient. The patient's daughter Christy Harrell, 28, is disabled secondary to pulmonary hypertension. She can be reached at [phone]. The patient has 2 grandchildren, Christy Harrell who works at a Human resources officer business and is 72 years old, in general, 46, completing high school.   ADVANCED DIRECTIVES:  HEALTH MAINTENANCE: History  Substance Use Topics  . Smoking status: Former Smoker    Quit date: 12/23/1974  . Smokeless tobacco: Never Used  . Alcohol Use: No     Colonoscopy:  PAP:    Bone density:  2012, on ibandronate chronically  Lipid panel: UTD  No Known Allergies  Current Outpatient Prescriptions  Medication Sig Dispense Refill  . cefUROXime (CEFTIN) 500 MG tablet Take 1 tablet (500 mg total) by mouth 2 (two) times daily.  20 tablet  0  . diclofenac sodium (VOLTAREN) 1 % GEL Apply 2 g topically 4 (four) times daily.  2 Tube  2  . Dietary Management Product (VASCULERA) TABS Take 1 capsule by mouth daily.  30 tablet  11  . docusate sodium (COLACE) 100 MG capsule Take 100 mg by mouth 2 (two) times daily as needed for constipation.      . fentaNYL (DURAGESIC - DOSED MCG/HR) 25 MCG/HR Place  1 patch onto the skin every 3 (three) days.      . furosemide (LASIX) 40 MG tablet Take 40 mg by mouth daily.      . hyaluronate sodium (RADIAPLEXRX) GEL Apply 1 application topically 2 (two) times daily. Apply to affected area after rad txs and bedtime      . levothyroxine (SYNTHROID, LEVOTHROID) 150 MCG tablet Take 1 tablet (150 mcg total) by mouth daily.  90 tablet  1  . metFORMIN (GLUCOPHAGE) 500 MG tablet TAKE 1 TABLET ONCE DAILY.  30 tablet  5  . oxyCODONE (ROXICODONE) 5 MG immediate release tablet 1-2 every 4-6 hours prn pain  25 tablet  0  . Polyethyl Glycol-Propyl Glycol (SYSTANE ULTRA OP) Place 1 drop into both eyes daily.      . pravastatin (PRAVACHOL) 40 MG tablet Take 1 tablet (40 mg total)  by mouth daily.  90 tablet  3  . pregabalin (LYRICA) 75 MG capsule Take 75 mg by mouth 2 (two) times daily. Not taking      . sulfamethoxazole-trimethoprim (SEPTRA DS) 800-160 MG per tablet Take 1 tablet by mouth 2 (two) times daily.  20 tablet  0   No current facility-administered medications for this visit.   PHYSICAL EXAM: Elderly African American woman who appears stated age 8 Vitals:   05/09/13 1135  BP: 146/85  Pulse: 70  Temp: 98.6 F (37 C)  Resp: 20   Body mass index is 42.07 kg/(m^2).    ECOG FS: 2 Filed Weights   05/09/13 1135  Weight: 215 lb 6.4 oz (97.705 kg)    Sclerae unicteric, pupils equal round and reactive Oropharynx clear, no ulcerations or evidence of candidiasis No cervical or supraclavicular adenopathy Lungs no rales or rhonchi, fair excursion bilaterally Heart regular rate and rhythm Abdomen obese, soft, nontender to palpation, positive bowel sounds MSK chronic lymphedema right upper extremity with compression sleeve and glove in place; minimal edema noted in the left upper extremity, with limited range of motion as previously noted  Neuro: nonfocal, well oriented, pleasant affect Breasts: The right breast is status post prior lumpectomy. There is no  evidence of local recurrence. The right axilla is benign. The left breast is unremarkable   LAB RESULTS: Lab Results  Component Value Date   WBC 5.9 05/09/2013   NEUTROABS 3.4 05/09/2013   HGB 11.6 05/09/2013   HCT 35.2 05/09/2013   MCV 84.1 05/09/2013   PLT 317 05/09/2013      Chemistry      Component Value Date/Time   NA 143 05/09/2013 1116   NA 141 03/16/2013 1352   K 4.1 05/09/2013 1116   K 4.1 03/16/2013 1352   CL 104 03/16/2013 1352   CL 105 01/23/2013 1337   CO2 28 05/09/2013 1116   CO2 31 03/16/2013 1352   BUN 10.3 05/09/2013 1116   BUN 14 03/16/2013 1352   CREATININE 0.8 05/09/2013 1116   CREATININE 0.8 03/16/2013 1352      Component Value Date/Time   CALCIUM 9.4 05/09/2013 1116   CALCIUM 9.6 03/16/2013 1352   ALKPHOS 56 05/09/2013 1116   ALKPHOS 62 03/16/2013 1352   AST 14 05/09/2013 1116   AST 15 03/16/2013 1352   ALT 9 05/09/2013 1116   ALT 11 03/16/2013 1352   BILITOT 0.31 05/09/2013 1116   BILITOT 0.4 03/16/2013 1352       Lab Results  Component Value Date   LABCA2 24 11/19/2011    STUDIES: Dg Chest 2 View  05/08/2013   CLINICAL DATA:  Breast cancer, followup, history hypertension, diabetes  EXAM: CHEST  2 VIEW  COMPARISON:  12/27/2012  FINDINGS: Upper-normal size of cardiac silhouette.  Calcified tortuous aorta.  Pulmonary vascularity normal.  Lungs clear.  No pleural effusion or pneumothorax.  Surgical clips at right axilla question axillary node dissection.  Scattered endplate spur formation thoracic spine.  Prior ORIF of the proximal left humerus.  IMPRESSION: No acute abnormalities.   Electronically Signed   By: Ulyses Southward M.D.   On: 05/08/2013 13:09   Nm Bone Scan Whole Body  05/08/2013   CLINICAL DATA:  Left shoulder pain. History of left shoulder fracture.  EXAM: NUCLEAR MEDICINE WHOLE BODY BONE SCAN  TECHNIQUE: Whole body anterior and posterior images were obtained approximately 3 hours after intravenous injection of radiopharmaceutical.  COMPARISON:  PET-CT scan 01/13/2013,  whole-body bone  scan 12/12/2012  RADIOPHARMACEUTICALS:  26.6 mCi of Technetium99 MDP  FINDINGS: There is intense uptake in the left proximal humerus at site of prior internal fixation. This is similar to comparison exam. Mild uptake in the right shoulders is likely degenerative and is also similar. Uptake within the posterior lumbar spine at L4/ L5 is likely degenerative. No evidence of skeletal metastasis.  IMPRESSION: 1. Uptake in left shoulder relates to prior surgery. Cannot exclude underlying lesion.  2. Degenerative uptake in the lower lumbar spine.   Electronically Signed   By: Genevive Bi M.D.   On: 05/08/2013 16:07    ASSESSMENT: 72 y.o.  [place] woman with stage IV [bone only] breast cancer in a  (1)  status post right lumpectomy and axillary lymph node dissection 11/17/2010 for a pT2 pN1, stage IIB invasive ductal carcinoma, grade 2, estrogen and progesterone receptor positive, HER-2 negative,   (2) Oncotype DX recurrence score of 27 predicting a risk of distant recurrence of 18% with 5 years of tamoxifen (intermediate score);   (3)  status post radiation completed July of 2012,   (4) started anastrozole July 2012, discontinued June 2014 with evidence of metastatic spread to bone  (5) Chronic lymphedema in the right upper extremity.  (6) pathologic fracture of the left humerus along apparent lytic lesion, with no other bone lesions per bone scan 12/12/2012  (7) on 12/27/2012 underwent #1 intramedullary nail left pathologic proximal humerus fracture  #2 left shoulder rotator cuff repair  #3 left shoulder open bone biopsy with pathology showing metastatic breast adenocarcinoma, estrogen receptor 100% positive, progesterone receptor and HER-2 negative, with an MIB-1 of 26%. PET scan 01/13/2013 showed no other areas of disease and  (8) status post 35 gray to the left proximal humerus completed 03/13/2013(  (9) fulvestrant, first injections on 01/23/2013  (10 zolendronic acid  given monthly, first dose 01/23/2013  PLAN:  Waynesha is tolerating her fulvestrant and zolendronate well. It is remarkable that we do not see multiple bony lesions or actually any evidence of active disease. The plan is going to be to continue the fulvestrant and zolendronate monthly. She will see [place] again in January and we will obtain a repeat bone scan sometime in late December. She also needs to establish yourself with a dentist locally At this point she is planning to go to [place] to visit family. Her 82 year old mother will be in respite care. The patient is aware of concerns regarding worsening lymphedema in long flights. She is also concerned that her lymphedema equipment may be a problem regarding security. She requests that I write a letter stating that this is part of her normal treatment I am glad to do so. She will pick up a letter for her next treatment here, which will be October 6. MAGRINAT,GUSTAV C    05/09/2013

## 2013-05-10 ENCOUNTER — Ambulatory Visit: Payer: Medicare Other | Attending: Orthopedic Surgery | Admitting: Physical Therapy

## 2013-05-10 DIAGNOSIS — IMO0001 Reserved for inherently not codable concepts without codable children: Secondary | ICD-10-CM | POA: Insufficient documentation

## 2013-05-10 DIAGNOSIS — C50919 Malignant neoplasm of unspecified site of unspecified female breast: Secondary | ICD-10-CM | POA: Insufficient documentation

## 2013-05-10 DIAGNOSIS — I890 Lymphedema, not elsewhere classified: Secondary | ICD-10-CM | POA: Insufficient documentation

## 2013-05-11 ENCOUNTER — Ambulatory Visit (INDEPENDENT_AMBULATORY_CARE_PROVIDER_SITE_OTHER): Payer: Medicare Other | Admitting: Internal Medicine

## 2013-05-11 ENCOUNTER — Ambulatory Visit: Payer: Medicare Other | Admitting: Internal Medicine

## 2013-05-11 ENCOUNTER — Encounter: Payer: Self-pay | Admitting: Internal Medicine

## 2013-05-11 DIAGNOSIS — M159 Polyosteoarthritis, unspecified: Secondary | ICD-10-CM

## 2013-05-11 DIAGNOSIS — I8310 Varicose veins of unspecified lower extremity with inflammation: Secondary | ICD-10-CM

## 2013-05-11 DIAGNOSIS — M19019 Primary osteoarthritis, unspecified shoulder: Secondary | ICD-10-CM

## 2013-05-11 DIAGNOSIS — Z23 Encounter for immunization: Secondary | ICD-10-CM

## 2013-05-11 DIAGNOSIS — I872 Venous insufficiency (chronic) (peripheral): Secondary | ICD-10-CM

## 2013-05-11 DIAGNOSIS — L02419 Cutaneous abscess of limb, unspecified: Secondary | ICD-10-CM

## 2013-05-11 MED ORDER — FENTANYL 25 MCG/HR TD PT72: 1.0000 | MEDICATED_PATCH | TRANSDERMAL | Status: DC

## 2013-05-11 MED ORDER — OXYCODONE HCL 5 MG PO TABS: ORAL_TABLET | ORAL | Status: DC

## 2013-05-11 NOTE — Progress Notes (Signed)
Subjective:    Patient ID: Christy Harrell, female    DOB: 07-18-1941, 72 y.o.   MRN: [id number]  Arthritis Presents for follow-up visit. The disease course has been stable. She complains of pain. She reports no stiffness, joint swelling or joint warmth. Affected locations include the left shoulder, right shoulder, left knee and right knee. Her pain is at a severity of 6/10. Associated symptoms include pain at night and pain while resting. Pertinent negatives include no diarrhea, dry eyes, dry mouth, dysuria, fatigue, fever, rash, Raynaud's syndrome, uveitis or weight loss. Her past medical history is significant for osteoarthritis. Her pertinent risk factors include overuse. Past treatments include an opioid and NSAIDs. The treatment provided significant relief. Factors aggravating her arthritis include activity.      Review of Systems  Constitutional: Negative.  Negative for fever, chills, weight loss, diaphoresis, appetite change and fatigue.  HENT: Negative.   Eyes: Negative.   Respiratory: Negative.  Negative for cough, chest tightness, shortness of breath, wheezing and stridor.   Cardiovascular: Negative.  Negative for chest pain, palpitations and leg swelling.  Gastrointestinal: Negative.  Negative for nausea, abdominal pain, diarrhea and constipation.  Endocrine: Negative.   Genitourinary: Negative.  Negative for dysuria.  Musculoskeletal: Positive for arthritis. Negative for back pain, joint swelling, gait problem and stiffness.  Skin: Negative.  Negative for color change, pallor, rash and wound.  Allergic/Immunologic: Negative.   Neurological: Negative.  Negative for dizziness.  Hematological: Negative.  Negative for adenopathy. Does not bruise/bleed easily.  Psychiatric/Behavioral: Negative.        Objective:   Physical Exam  Vitals reviewed. Constitutional: She is oriented to person, place, and time. She appears well-developed and well-nourished. No distress.  HENT:   Head: Normocephalic and atraumatic.  Mouth/Throat: Oropharynx is clear and moist. No oropharyngeal exudate.  Eyes: Conjunctivae are normal. Right eye exhibits no discharge. Left eye exhibits no discharge. No scleral icterus.  Neck: Normal range of motion. Neck supple. No JVD present. No tracheal deviation present. No thyromegaly present.  Cardiovascular: Normal rate, regular rhythm, normal heart sounds and intact distal pulses.  Exam reveals no gallop and no friction rub.   No murmur heard. Pulmonary/Chest: Effort normal and breath sounds normal. No stridor. No respiratory distress. She has no wheezes. She has no rales. She exhibits no tenderness.  Abdominal: Soft. Bowel sounds are normal. She exhibits no distension and no mass. There is no tenderness. There is no rebound and no guarding.  Musculoskeletal: Normal range of motion. She exhibits no edema and no tenderness.  Lymphadenopathy:    She has no cervical adenopathy.  Neurological: She is oriented to person, place, and time.  Skin: Skin is warm and dry. No rash noted. She is not diaphoretic. No erythema. No pallor.  Psychiatric: She has a normal mood and affect. Her behavior is normal. Judgment and thought content normal.     Lab Results  Component Value Date   WBC 5.9 05/09/2013   HGB 11.6 05/09/2013   HCT 35.2 05/09/2013   PLT 317 05/09/2013   GLUCOSE 115 05/09/2013   CHOL 239* 03/16/2013   TRIG 105.0 03/16/2013   HDL 66.60 03/16/2013   LDLDIRECT 160.0 03/16/2013   ALT 9 05/09/2013   AST 14 05/09/2013   NA 143 05/09/2013   K 4.1 05/09/2013   CL 104 03/16/2013   CREATININE 0.8 05/09/2013   BUN 10.3 05/09/2013   CO2 28 05/09/2013   TSH 9.26* 03/16/2013   HGBA1C 6.1 03/16/2013   MICROALBUR 0.4  03/16/2013       Assessment & Plan:

## 2013-05-11 NOTE — Patient Instructions (Signed)

## 2013-05-12 ENCOUNTER — Encounter: Payer: Self-pay | Admitting: Internal Medicine

## 2013-05-12 NOTE — Assessment & Plan Note (Signed)
Her legs look great today She will continue taking vasculera

## 2013-05-12 NOTE — Assessment & Plan Note (Signed)
She is doing very well with fentanyl patch and the occasional dose of percocet

## 2013-05-15 ENCOUNTER — Ambulatory Visit (HOSPITAL_BASED_OUTPATIENT_CLINIC_OR_DEPARTMENT_OTHER): Payer: Medicare Other

## 2013-05-15 ENCOUNTER — Other Ambulatory Visit (HOSPITAL_BASED_OUTPATIENT_CLINIC_OR_DEPARTMENT_OTHER): Payer: Medicare Other | Admitting: Lab

## 2013-05-15 ENCOUNTER — Other Ambulatory Visit: Payer: Self-pay | Admitting: Oncology

## 2013-05-15 ENCOUNTER — Ambulatory Visit: Payer: Medicare Other

## 2013-05-15 VITALS — BP 129/62 | HR 69 | Temp 97.2°F | Resp 18

## 2013-05-15 DIAGNOSIS — M899 Disorder of bone, unspecified: Secondary | ICD-10-CM

## 2013-05-15 DIAGNOSIS — C50911 Malignant neoplasm of unspecified site of right female breast: Secondary | ICD-10-CM

## 2013-05-15 DIAGNOSIS — C50519 Malignant neoplasm of lower-outer quadrant of unspecified female breast: Secondary | ICD-10-CM

## 2013-05-15 DIAGNOSIS — C50919 Malignant neoplasm of unspecified site of unspecified female breast: Secondary | ICD-10-CM

## 2013-05-15 DIAGNOSIS — C7951 Secondary malignant neoplasm of bone: Secondary | ICD-10-CM

## 2013-05-15 DIAGNOSIS — Z5111 Encounter for antineoplastic chemotherapy: Secondary | ICD-10-CM

## 2013-05-15 LAB — CBC WITH DIFFERENTIAL/PLATELET
Basophils Absolute: 0 10*3/uL (ref 0.0–0.1)
Eosinophils Absolute: 0.3 10*3/uL (ref 0.0–0.5)
HCT: 36.6 % (ref 34.8–46.6)
HGB: 11.9 g/dL (ref 11.6–15.9)
MCH: 27.5 pg (ref 25.1–34.0)
MCV: 84.5 fL (ref 79.5–101.0)
NEUT#: 3.7 10*3/uL (ref 1.5–6.5)
NEUT%: 57.6 % (ref 38.4–76.8)
RDW: 14.8 % — ABNORMAL HIGH (ref 11.2–14.5)
lymph#: 1.9 10*3/uL (ref 0.9–3.3)

## 2013-05-15 LAB — COMPREHENSIVE METABOLIC PANEL (CC13)
AST: 14 U/L (ref 5–34)
Albumin: 2.8 g/dL — ABNORMAL LOW (ref 3.5–5.0)
BUN: 11.6 mg/dL (ref 7.0–26.0)
Calcium: 9.5 mg/dL (ref 8.4–10.4)
Chloride: 109 mEq/L (ref 98–109)
Creatinine: 0.8 mg/dL (ref 0.6–1.1)
Glucose: 112 mg/dl (ref 70–140)
Potassium: 4.1 mEq/L (ref 3.5–5.1)
Total Protein: 6.9 g/dL (ref 6.4–8.3)

## 2013-05-15 MED ORDER — SODIUM CHLORIDE 0.9 % IV SOLN
Freq: Once | INTRAVENOUS | Status: AC
  Administered 2013-05-15: 12:00:00 via INTRAVENOUS

## 2013-05-15 MED ORDER — FULVESTRANT 250 MG/5ML IM SOLN
500.0000 mg | Freq: Once | INTRAMUSCULAR | Status: AC
  Administered 2013-05-15: 500 mg via INTRAMUSCULAR
  Filled 2013-05-15: qty 10

## 2013-05-15 MED ORDER — ZOLEDRONIC ACID 4 MG/100ML IV SOLN
4.0000 mg | Freq: Once | INTRAVENOUS | Status: AC
  Administered 2013-05-15: 4 mg via INTRAVENOUS
  Filled 2013-05-15: qty 100

## 2013-05-15 NOTE — Patient Instructions (Addendum)
Fulvestrant injection What is this medicine? FULVESTRANT (ful VES trant) blocks the effects of estrogen. It is used to treat breast cancer in women past the age of menopause. This medicine may be used for other purposes; ask your health care provider or pharmacist if you have questions. What should I tell my health care provider before I take this medicine? They need to know if you have any of these conditions: -bleeding problems -liver disease -low levels of platelets in the blood -an unusual or allergic reaction to fulvestrant, other medicines, foods, dyes, or preservatives -pregnant or trying to get pregnant -breast-feeding How should I use this medicine? This medicine is for injection into a muscle. It is usually given by a health care professional in a hospital or clinic setting. Talk to your pediatrician regarding the use of this medicine in children. Special care may be needed. Overdosage: If you think you have taken too much of this medicine contact a poison control center or emergency room at once. NOTE: This medicine is only for you. Do not share this medicine with others. What if I miss a dose? It is important not to miss your dose. Call your doctor or health care professional if you are unable to keep an appointment. What may interact with this medicine? -medicines that treat or prevent blood clots like warfarin, enoxaparin, and dalteparin This list may not describe all possible interactions. Give your health care provider a list of all the medicines, herbs, non-prescription drugs, or dietary supplements you use. Also tell them if you smoke, drink alcohol, or use illegal drugs. Some items may interact with your medicine. What should I watch for while using this medicine? Your condition will be monitored carefully while you are receiving this medicine. You will need important blood work done while you are taking this medicine. Do not become pregnant while taking this medicine.  Women should inform their doctor if they wish to become pregnant or think they might be pregnant. There is a potential for serious side effects to an unborn child. Talk to your health care professional or pharmacist for more information. What side effects may I notice from receiving this medicine? Side effects that you should report to your doctor or health care professional as soon as possible: -allergic reactions like skin rash, itching or hives, swelling of the face, lips, or tongue -feeling faint or lightheaded, falls -fever or flu-like symptoms -sore throat -vaginal bleeding Side effects that usually do not require medical attention (report to your doctor or health care professional if they continue or are bothersome): -aches, pains -constipation or diarrhea -headache -hot flashes -nausea, vomiting -pain at site where injected -stomach pain This list may not describe all possible side effects. Call your doctor for medical advice about side effects. You may report side effects to FDA at 1-800-FDA-1088. Where should I keep my medicine? This drug is given in a hospital or clinic and will not be stored at home. NOTE: This sheet is a summary. It may not cover all possible information. If you have questions about this medicine, talk to your doctor, pharmacist, or health care provider.  2013, Elsevier/Gold Standard. (12/05/2007 3:39:24 PM) Zoledronic Acid injection (Hypercalcemia, Oncology) What is this medicine? ZOLEDRONIC ACID (ZOE le dron ik AS id) lowers the amount of calcium loss from bone. It is used to treat too much calcium in your blood from cancer. It is also used to prevent complications of cancer that has spread to the bone. This medicine may be used for   other purposes; ask your health care provider or pharmacist if you have questions. What should I tell my health care provider before I take this medicine? They need to know if you have any of these conditions: -aspirin-sensitive  asthma -dental disease -kidney disease -an unusual or allergic reaction to zoledronic acid, other medicines, foods, dyes, or preservatives -pregnant or trying to get pregnant -breast-feeding How should I use this medicine? This medicine is for infusion into a vein. It is given by a health care professional in a hospital or clinic setting. Talk to your pediatrician regarding the use of this medicine in children. Special care may be needed. Overdosage: If you think you have taken too much of this medicine contact a poison control center or emergency room at once. NOTE: This medicine is only for you. Do not share this medicine with others. What if I miss a dose? It is important not to miss your dose. Call your doctor or health care professional if you are unable to keep an appointment. What may interact with this medicine? -certain antibiotics given by injection -NSAIDs, medicines for pain and inflammation, like ibuprofen or naproxen -some diuretics like bumetanide, furosemide -teriparatide -thalidomide This list may not describe all possible interactions. Give your health care provider a list of all the medicines, herbs, non-prescription drugs, or dietary supplements you use. Also tell them if you smoke, drink alcohol, or use illegal drugs. Some items may interact with your medicine. What should I watch for while using this medicine? Visit your doctor or health care professional for regular checkups. It may be some time before you see the benefit from this medicine. Do not stop taking your medicine unless your doctor tells you to. Your doctor may order blood tests or other tests to see how you are doing. Women should inform their doctor if they wish to become pregnant or think they might be pregnant. There is a potential for serious side effects to an unborn child. Talk to your health care professional or pharmacist for more information. You should make sure that you get enough calcium and  vitamin D while you are taking this medicine. Discuss the foods you eat and the vitamins you take with your health care professional. Some people who take this medicine have severe bone, joint, and/or muscle pain. This medicine may also increase your risk for a broken thigh bone. Tell your doctor right away if you have pain in your upper leg or groin. Tell your doctor if you have any pain that does not go away or that gets worse. What side effects may I notice from receiving this medicine? Side effects that you should report to your doctor or health care professional as soon as possible: -allergic reactions like skin rash, itching or hives, swelling of the face, lips, or tongue -anxiety, confusion, or depression -breathing problems -changes in vision -feeling faint or lightheaded, falls -jaw burning, cramping, pain -muscle cramps, stiffness, or weakness -trouble passing urine or change in the amount of urine Side effects that usually do not require medical attention (report to your doctor or health care professional if they continue or are bothersome): -bone, joint, or muscle pain -fever -hair loss -irritation at site where injected -loss of appetite -nausea, vomiting -stomach upset -tired This list may not describe all possible side effects. Call your doctor for medical advice about side effects. You may report side effects to FDA at 1-800-FDA-1088. Where should I keep my medicine? This drug is given in a hospital or clinic   and will not be stored at home. NOTE: This sheet is a summary. It may not cover all possible information. If you have questions about this medicine, talk to your doctor, pharmacist, or health care provider.  2013, Elsevier/Gold Standard. (01/23/2011 9:06:58 AM)  

## 2013-05-16 ENCOUNTER — Ambulatory Visit: Payer: Medicare Other | Admitting: Physical Therapy

## 2013-06-09 ENCOUNTER — Other Ambulatory Visit: Payer: Self-pay | Admitting: *Deleted

## 2013-06-13 ENCOUNTER — Ambulatory Visit: Payer: Medicare Other | Admitting: Physical Therapy

## 2013-06-13 ENCOUNTER — Telehealth: Payer: Self-pay | Admitting: *Deleted

## 2013-06-13 NOTE — Telephone Encounter (Signed)
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  POF sent to scheduling.

## 2013-06-15 ENCOUNTER — Other Ambulatory Visit (INDEPENDENT_AMBULATORY_CARE_PROVIDER_SITE_OTHER): Payer: Medicare Other

## 2013-06-15 ENCOUNTER — Encounter: Payer: Self-pay | Admitting: Internal Medicine

## 2013-06-15 ENCOUNTER — Ambulatory Visit (INDEPENDENT_AMBULATORY_CARE_PROVIDER_SITE_OTHER): Payer: Medicare Other | Admitting: Internal Medicine

## 2013-06-15 ENCOUNTER — Telehealth: Payer: Self-pay | Admitting: Oncology

## 2013-06-15 VITALS — BP 126/86 | HR 80 | Temp 98.3°F | Resp 16 | Ht 60.0 in | Wt 214.0 lb

## 2013-06-15 DIAGNOSIS — E039 Hypothyroidism, unspecified: Secondary | ICD-10-CM

## 2013-06-15 DIAGNOSIS — R109 Unspecified abdominal pain: Secondary | ICD-10-CM

## 2013-06-15 DIAGNOSIS — R10A1 Flank pain, right side: Secondary | ICD-10-CM | POA: Insufficient documentation

## 2013-06-15 LAB — COMPREHENSIVE METABOLIC PANEL
ALT: 12 U/L (ref 0–35)
AST: 16 U/L (ref 0–37)
Albumin: 3.2 g/dL — ABNORMAL LOW (ref 3.5–5.2)
Alkaline Phosphatase: 57 U/L (ref 39–117)
BUN: 11 mg/dL (ref 6–23)
Chloride: 102 mEq/L (ref 96–112)
Potassium: 4.1 mEq/L (ref 3.5–5.1)
Sodium: 138 mEq/L (ref 135–145)
Total Bilirubin: 0.4 mg/dL (ref 0.3–1.2)
Total Protein: 7.1 g/dL (ref 6.0–8.3)

## 2013-06-15 LAB — CBC WITH DIFFERENTIAL/PLATELET
Basophils Absolute: 0 10*3/uL (ref 0.0–0.1)
Basophils Relative: 0.3 % (ref 0.0–3.0)
Eosinophils Absolute: 0.2 10*3/uL (ref 0.0–0.7)
MCHC: 33.5 g/dL (ref 30.0–36.0)
MCV: 84.3 fl (ref 78.0–100.0)
Monocytes Absolute: 0.8 10*3/uL (ref 0.1–1.0)
Neutrophils Relative %: 59.2 % (ref 43.0–77.0)
Platelets: 324 10*3/uL (ref 150.0–400.0)
RBC: 4.11 Mil/uL (ref 3.87–5.11)
RDW: 15.3 % — ABNORMAL HIGH (ref 11.5–14.6)

## 2013-06-15 LAB — TSH: TSH: 0.06 u[IU]/mL — ABNORMAL LOW (ref 0.35–5.50)

## 2013-06-15 LAB — URINALYSIS, ROUTINE W REFLEX MICROSCOPIC
Bilirubin Urine: NEGATIVE
Hgb urine dipstick: NEGATIVE
Ketones, ur: NEGATIVE
Leukocytes, UA: NEGATIVE
Nitrite: NEGATIVE
Total Protein, Urine: NEGATIVE
pH: 8 (ref 5.0–8.0)

## 2013-06-15 LAB — AMYLASE: Amylase: 66 U/L (ref 27–131)

## 2013-06-15 LAB — LIPASE: Lipase: 20 U/L (ref 11.0–59.0)

## 2013-06-15 NOTE — Assessment & Plan Note (Signed)
I will check her TSH and will adjust her dose if needed 

## 2013-06-15 NOTE — Telephone Encounter (Signed)
Per 11/4 pof r/s 11/3 to AM. Pt had no appts on 11/3 and pof is not clear as to what should be scheduled. lmonvm for Val (pof sent by Val) to clarify instruction. No action taken.

## 2013-06-15 NOTE — Progress Notes (Signed)
Pre visit review using our clinic review tool, if applicable. No additional management support is needed unless otherwise documented below in the visit note. 

## 2013-06-15 NOTE — Assessment & Plan Note (Signed)
I will check an xray to see if there is a stone, fracture, etc Will check her UA to see if there is a UTI, stone Will also check her labs to see if there is hepatitis, pancreatitis, renal failure, anemia, signs of infection She will cont oxycodone as needed for pain

## 2013-06-15 NOTE — Progress Notes (Signed)
Subjective:    Patient ID: Christy Harrell, female    DOB: 02/19/1941, 72 y.o.   MRN: [id number]  Flank Pain This is a recurrent problem. The current episode started 1 to 4 weeks ago. The problem occurs intermittently. The problem is unchanged. Pain location: right flank. The quality of the pain is described as aching. The pain does not radiate. The pain is at a severity of 4/10. The pain is moderate. Pertinent negatives include no abdominal pain, bladder incontinence, bowel incontinence, chest pain, dysuria, fever, headaches, leg pain, numbness, paresis, paresthesias, pelvic pain, perianal numbness, tingling, weakness or weight loss. Risk factors include obesity and lack of exercise. She has tried analgesics for the symptoms. The treatment provided moderate relief.      Review of Systems  Constitutional: Positive for fatigue. Negative for fever, chills, weight loss, diaphoresis and appetite change.  HENT: Negative.   Eyes: Negative.   Respiratory: Negative.   Cardiovascular: Negative.  Negative for chest pain, palpitations and leg swelling.  Gastrointestinal: Negative.  Negative for nausea, vomiting, abdominal pain, constipation, blood in stool and bowel incontinence.  Endocrine: Negative.   Genitourinary: Positive for flank pain. Negative for bladder incontinence, dysuria, urgency, frequency, hematuria, decreased urine volume, vaginal bleeding, vaginal discharge, enuresis, difficulty urinating, genital sores, vaginal pain, menstrual problem, pelvic pain and dyspareunia.  Musculoskeletal: Negative.  Negative for back pain.  Skin: Negative.  Negative for color change, pallor, rash and wound.  Allergic/Immunologic: Negative.   Neurological: Negative.  Negative for dizziness, tingling, tremors, seizures, syncope, facial asymmetry, speech difficulty, weakness, light-headedness, numbness, headaches and paresthesias.  Hematological: Negative.  Negative for adenopathy. Does not bruise/bleed  easily.  Psychiatric/Behavioral: Negative.        Objective:   Physical Exam  Vitals reviewed. Constitutional: She is oriented to person, place, and time. She appears well-developed and well-nourished.  Non-toxic appearance. She does not have a sickly appearance. She does not appear ill. No distress.  HENT:  Head: Normocephalic and atraumatic.  Mouth/Throat: No oropharyngeal exudate.  Eyes: Conjunctivae are normal. Right eye exhibits no discharge. Left eye exhibits no discharge. No scleral icterus.  Neck: Normal range of motion. Neck supple. No JVD present. No tracheal deviation present. No thyromegaly present.  Cardiovascular: Normal rate, regular rhythm, normal heart sounds and intact distal pulses.  Exam reveals no gallop and no friction rub.   No murmur heard. Pulmonary/Chest: Effort normal and breath sounds normal. No stridor. No respiratory distress. She has no wheezes. She has no rales. She exhibits no tenderness.  Abdominal: Soft. Normal appearance and bowel sounds are normal. She exhibits no distension. There is no hepatosplenomegaly, splenomegaly or hepatomegaly. There is no tenderness. There is no rebound, no guarding and no CVA tenderness. No hernia. Hernia confirmed negative in the ventral area, confirmed negative in the right inguinal area and confirmed negative in the left inguinal area.  Musculoskeletal: Normal range of motion. She exhibits no edema and no tenderness.  Lymphadenopathy:    She has no cervical adenopathy.  Neurological: She is oriented to person, place, and time.  Skin: Skin is warm and dry. No rash noted. She is not diaphoretic. No erythema. No pallor.  Psychiatric: She has a normal mood and affect. Her behavior is normal. Judgment and thought content normal.     Lab Results  Component Value Date   WBC 6.5 05/15/2013   HGB 11.9 05/15/2013   HCT 36.6 05/15/2013   PLT 248 05/15/2013   GLUCOSE 112 05/15/2013   CHOL 239* 03/16/2013  TRIG 105.0 03/16/2013   HDL  66.60 03/16/2013   LDLDIRECT 160.0 03/16/2013   ALT 8 05/15/2013   AST 14 05/15/2013   NA 143 05/15/2013   K 4.1 05/15/2013   CL 104 03/16/2013   CREATININE 0.8 05/15/2013   BUN 11.6 05/15/2013   CO2 25 05/15/2013   TSH 9.26* 03/16/2013   HGBA1C 6.1 03/16/2013   MICROALBUR 0.4 03/16/2013       Assessment & Plan:

## 2013-06-15 NOTE — Patient Instructions (Signed)

## 2013-06-16 ENCOUNTER — Other Ambulatory Visit: Payer: Self-pay | Admitting: *Deleted

## 2013-06-19 ENCOUNTER — Ambulatory Visit (INDEPENDENT_AMBULATORY_CARE_PROVIDER_SITE_OTHER)
Admission: RE | Admit: 2013-06-19 | Discharge: 2013-06-19 | Disposition: A | Discharge status: Home / Self Care | Payer: Medicare Other | Source: Ambulatory Visit | Attending: Internal Medicine | Admitting: Internal Medicine

## 2013-06-19 ENCOUNTER — Other Ambulatory Visit: Payer: Self-pay | Admitting: *Deleted

## 2013-06-19 ENCOUNTER — Telehealth: Payer: Self-pay | Admitting: *Deleted

## 2013-06-19 DIAGNOSIS — R109 Unspecified abdominal pain: Secondary | ICD-10-CM

## 2013-06-19 NOTE — Telephone Encounter (Signed)
Per staff message and POF I have scheduled appts.  JMW  

## 2013-06-20 ENCOUNTER — Ambulatory Visit: Payer: Medicare Other | Attending: Orthopedic Surgery | Admitting: Physical Therapy

## 2013-06-20 ENCOUNTER — Telehealth: Payer: Self-pay | Admitting: *Deleted

## 2013-06-20 DIAGNOSIS — I890 Lymphedema, not elsewhere classified: Secondary | ICD-10-CM | POA: Insufficient documentation

## 2013-06-20 DIAGNOSIS — IMO0001 Reserved for inherently not codable concepts without codable children: Secondary | ICD-10-CM | POA: Insufficient documentation

## 2013-06-20 DIAGNOSIS — C50919 Malignant neoplasm of unspecified site of unspecified female breast: Secondary | ICD-10-CM | POA: Insufficient documentation

## 2013-06-20 NOTE — Telephone Encounter (Signed)
Per desk RN voicemail and POF I have scheduled appt .

## 2013-06-21 ENCOUNTER — Ambulatory Visit: Payer: Medicare Other

## 2013-06-27 ENCOUNTER — Other Ambulatory Visit (HOSPITAL_BASED_OUTPATIENT_CLINIC_OR_DEPARTMENT_OTHER): Payer: Medicare Other | Admitting: Lab

## 2013-06-27 ENCOUNTER — Ambulatory Visit (HOSPITAL_BASED_OUTPATIENT_CLINIC_OR_DEPARTMENT_OTHER): Payer: Medicare Other

## 2013-06-27 ENCOUNTER — Telehealth: Payer: Self-pay | Admitting: *Deleted

## 2013-06-27 VITALS — BP 133/68 | HR 82 | Temp 97.2°F

## 2013-06-27 DIAGNOSIS — C50519 Malignant neoplasm of lower-outer quadrant of unspecified female breast: Secondary | ICD-10-CM

## 2013-06-27 DIAGNOSIS — M899 Disorder of bone, unspecified: Secondary | ICD-10-CM

## 2013-06-27 DIAGNOSIS — C7951 Secondary malignant neoplasm of bone: Secondary | ICD-10-CM

## 2013-06-27 DIAGNOSIS — C50911 Malignant neoplasm of unspecified site of right female breast: Secondary | ICD-10-CM

## 2013-06-27 DIAGNOSIS — Z5111 Encounter for antineoplastic chemotherapy: Secondary | ICD-10-CM

## 2013-06-27 LAB — COMPREHENSIVE METABOLIC PANEL (CC13)
AST: 12 U/L (ref 5–34)
Albumin: 2.9 g/dL — ABNORMAL LOW (ref 3.5–5.0)
BUN: 11.8 mg/dL (ref 7.0–26.0)
CO2: 25 mEq/L (ref 22–29)
Calcium: 9.4 mg/dL (ref 8.4–10.4)
Chloride: 109 mEq/L (ref 98–109)
Creatinine: 0.7 mg/dL (ref 0.6–1.1)
Glucose: 136 mg/dl (ref 70–140)
Potassium: 3.7 mEq/L (ref 3.5–5.1)
Sodium: 142 mEq/L (ref 136–145)
Total Protein: 6.7 g/dL (ref 6.4–8.3)

## 2013-06-27 LAB — CBC WITH DIFFERENTIAL/PLATELET
Basophils Absolute: 0 10*3/uL (ref 0.0–0.1)
EOS%: 2.3 % (ref 0.0–7.0)
Eosinophils Absolute: 0.2 10*3/uL (ref 0.0–0.5)
HGB: 11.7 g/dL (ref 11.6–15.9)
MCHC: 32.5 g/dL (ref 31.5–36.0)
MCV: 86.5 fL (ref 79.5–101.0)
MONO%: 7.3 % (ref 0.0–14.0)
NEUT#: 4.5 10*3/uL (ref 1.5–6.5)
NEUT%: 62.3 % (ref 38.4–76.8)
RDW: 15.2 % — ABNORMAL HIGH (ref 11.2–14.5)
lymph#: 2 10*3/uL (ref 0.9–3.3)

## 2013-06-27 MED ORDER — FULVESTRANT 250 MG/5ML IM SOLN
500.0000 mg | Freq: Once | INTRAMUSCULAR | Status: AC
  Administered 2013-06-27: 500 mg via INTRAMUSCULAR
  Filled 2013-06-27: qty 10

## 2013-06-27 MED ORDER — ZOLEDRONIC ACID 4 MG/100ML IV SOLN
4.0000 mg | Freq: Once | INTRAVENOUS | Status: AC
  Administered 2013-06-27: 4 mg via INTRAVENOUS
  Filled 2013-06-27: qty 100

## 2013-06-27 MED ORDER — ZOLEDRONIC ACID 4 MG/100ML IV SOLN
4.0000 mg | Freq: Once | INTRAVENOUS | Status: DC
  Filled 2013-06-27: qty 100

## 2013-06-27 NOTE — Progress Notes (Signed)
Patient has no follow up appointment with Dr. Magrinat or midlevel ordered and she thinks she is supposed to have another bone scan in next couple months. Will make MD aware, so schedule can be made. Already made schedule for her monthly Zometa & Faslodex. 

## 2013-06-27 NOTE — Patient Instructions (Signed)
Zoledronic Acid injection (Paget's Disease, Osteoporosis) What is this medicine? ZOLEDRONIC ACID (ZOE le dron ik AS id) lowers the amount of calcium loss from bone. It is used to treat Paget's disease and osteoporosis in women. This medicine may be used for other purposes; ask your health care provider or pharmacist if you have questions. COMMON BRAND NAME(S): Reclast, Zometa What should I tell my health care provider before I take this medicine? They need to know if you have any of these conditions: -aspirin-sensitive asthma -cancer, especially if you are receiving medicines used to treat cancer -dental disease or wear dentures -infection -kidney disease -low levels of calcium in the blood -past surgery on the parathyroid gland or intestines -receiving corticosteroids like dexamethasone or prednisone -an unusual or allergic reaction to zoledronic acid, other medicines, foods, dyes, or preservatives -pregnant or trying to get pregnant -breast-feeding How should I use this medicine? This medicine is for infusion into a vein. It is given by a health care professional in a hospital or clinic setting. Talk to your pediatrician regarding the use of this medicine in children. This medicine is not approved for use in children. Overdosage: If you think you have taken too much of this medicine contact a poison control center or emergency room at once. NOTE: This medicine is only for you. Do not share this medicine with others. What if I miss a dose? It is important not to miss your dose. Call your doctor or health care professional if you are unable to keep an appointment. What may interact with this medicine? -certain antibiotics given by injection -NSAIDs, medicines for pain and inflammation, like ibuprofen or naproxen -some diuretics like bumetanide, furosemide -teriparatide This list may not describe all possible interactions. Give your health care provider a list of all the medicines,  herbs, non-prescription drugs, or dietary supplements you use. Also tell them if you smoke, drink alcohol, or use illegal drugs. Some items may interact with your medicine. What should I watch for while using this medicine? Visit your doctor or health care professional for regular checkups. It may be some time before you see the benefit from this medicine. Do not stop taking your medicine unless your doctor tells you to. Your doctor may order blood tests or other tests to see how you are doing. Women should inform their doctor if they wish to become pregnant or think they might be pregnant. There is a potential for serious side effects to an unborn child. Talk to your health care professional or pharmacist for more information. You should make sure that you get enough calcium and vitamin D while you are taking this medicine. Discuss the foods you eat and the vitamins you take with your health care professional. Some people who take this medicine have severe bone, joint, and/or muscle pain. This medicine may also increase your risk for jaw problems or a broken thigh bone. Tell your doctor right away if you have severe pain in your jaw, bones, joints, or muscles. Tell your doctor if you have any pain that does not go away or that gets worse. Tell your dentist and dental surgeon that you are taking this medicine. You should not have major dental surgery while on this medicine. See your dentist to have a dental exam and fix any dental problems before starting this medicine. Take good care of your teeth while on this medicine. Make sure you see your dentist for regular follow-up appointments. What side effects may I notice from receiving this medicine?   Side effects that you should report to your doctor or health care professional as soon as possible: -allergic reactions like skin rash, itching or hives, swelling of the face, lips, or tongue -anxiety, confusion, or depression -breathing problems -changes in  vision -eye pain -feeling faint or lightheaded, falls -jaw pain, especially after dental work -mouth sores -muscle cramps, stiffness, or weakness -trouble passing urine or change in the amount of urine Side effects that usually do not require medical attention (report to your doctor or health care professional if they continue or are bothersome): -bone, joint, or muscle pain -constipation -diarrhea -fever -hair loss -irritation at site where injected -loss of appetite -nausea, vomiting -stomach upset -trouble sleeping -trouble swallowing -weak or tired This list may not describe all possible side effects. Call your doctor for medical advice about side effects. You may report side effects to FDA at 1-800-FDA-1088. Where should I keep my medicine? This drug is given in a hospital or clinic and will not be stored at home. NOTE: This sheet is a summary. It may not cover all possible information. If you have questions about this medicine, talk to your doctor, pharmacist, or health care provider.  2014, [employer] Standard. (2013-01-09 10:03:48)  Fulvestrant injection What is this medicine? FULVESTRANT (ful VES trant) blocks the effects of estrogen. It is used to treat breast cancer in women past the age of menopause. This medicine may be used for other purposes; ask your health care provider or pharmacist if you have questions. COMMON BRAND NAME(S): FASLODEX What should I tell my health care provider before I take this medicine? They need to know if you have any of these conditions: -bleeding problems -liver disease -low levels of platelets in the blood -an unusual or allergic reaction to fulvestrant, other medicines, foods, dyes, or preservatives -pregnant or trying to get pregnant -breast-feeding How should I use this medicine? This medicine is for injection into a muscle. It is usually given by a health care professional in a hospital or clinic setting. Talk to your  pediatrician regarding the use of this medicine in children. Special care may be needed. Overdosage: If you think you have taken too much of this medicine contact a poison control center or emergency room at once. NOTE: This medicine is only for you. Do not share this medicine with others. What if I miss a dose? It is important not to miss your dose. Call your doctor or health care professional if you are unable to keep an appointment. What may interact with this medicine? -medicines that treat or prevent blood clots like warfarin, enoxaparin, and dalteparin This list may not describe all possible interactions. Give your health care provider a list of all the medicines, herbs, non-prescription drugs, or dietary supplements you use. Also tell them if you smoke, drink alcohol, or use illegal drugs. Some items may interact with your medicine. What should I watch for while using this medicine? Your condition will be monitored carefully while you are receiving this medicine. You will need important blood work done while you are taking this medicine. Do not become pregnant while taking this medicine. Women should inform their doctor if they wish to become pregnant or think they might be pregnant. There is a potential for serious side effects to an unborn child. Talk to your health care professional or pharmacist for more information. What side effects may I notice from receiving this medicine? Side effects that you should report to your doctor or health care professional as soon as   possible: -allergic reactions like skin rash, itching or hives, swelling of the face, lips, or tongue -feeling faint or lightheaded, falls -fever or flu-like symptoms -sore throat -vaginal bleeding Side effects that usually do not require medical attention (report to your doctor or health care professional if they continue or are bothersome): -aches, pains -constipation or diarrhea -headache -hot flashes -nausea,  vomiting -pain at site where injected -stomach pain This list may not describe all possible side effects. Call your doctor for medical advice about side effects. You may report side effects to FDA at 1-800-FDA-1088. Where should I keep my medicine? This drug is given in a hospital or clinic and will not be stored at home. NOTE: This sheet is a summary. It may not cover all possible information. If you have questions about this medicine, talk to your doctor, pharmacist, or health care provider.  2014, Elsevier/Gold Standard. (2007-12-05 15:39:24)   

## 2013-06-27 NOTE — Progress Notes (Deleted)
Patient has no follow up appointment with Dr. Darnelle Catalan or midlevel ordered and she thinks she is supposed to have another bone scan in next couple months. Will make MD aware, so schedule can be made. Already made schedule for her monthly Zometa & Faslodex.

## 2013-06-27 NOTE — Telephone Encounter (Signed)
Ihave scheduled appts for patient

## 2013-06-29 ENCOUNTER — Telehealth: Payer: Self-pay | Admitting: Oncology

## 2013-06-29 NOTE — Telephone Encounter (Signed)
s/w pt confirming appts for 12/8, 08/14/13 and 09/11/13. per pt she appts

## 2013-07-04 ENCOUNTER — Ambulatory Visit: Payer: Medicare Other | Admitting: Physical Therapy

## 2013-07-05 ENCOUNTER — Ambulatory Visit: Payer: Medicare Other | Admitting: Internal Medicine

## 2013-07-17 ENCOUNTER — Ambulatory Visit (HOSPITAL_BASED_OUTPATIENT_CLINIC_OR_DEPARTMENT_OTHER): Payer: Medicare Other

## 2013-07-17 ENCOUNTER — Other Ambulatory Visit (HOSPITAL_BASED_OUTPATIENT_CLINIC_OR_DEPARTMENT_OTHER): Payer: Medicare Other | Admitting: Lab

## 2013-07-17 VITALS — BP 143/57 | HR 62 | Temp 98.0°F | Resp 18

## 2013-07-17 DIAGNOSIS — C50911 Malignant neoplasm of unspecified site of right female breast: Secondary | ICD-10-CM

## 2013-07-17 DIAGNOSIS — C7951 Secondary malignant neoplasm of bone: Secondary | ICD-10-CM

## 2013-07-17 DIAGNOSIS — C50519 Malignant neoplasm of lower-outer quadrant of unspecified female breast: Secondary | ICD-10-CM

## 2013-07-17 DIAGNOSIS — Z5111 Encounter for antineoplastic chemotherapy: Secondary | ICD-10-CM

## 2013-07-17 DIAGNOSIS — M899 Disorder of bone, unspecified: Secondary | ICD-10-CM

## 2013-07-17 LAB — CBC WITH DIFFERENTIAL/PLATELET
EOS%: 3.7 % (ref 0.0–7.0)
Eosinophils Absolute: 0.3 10*3/uL (ref 0.0–0.5)
MCV: 86.1 fL (ref 79.5–101.0)
MONO#: 0.6 10*3/uL (ref 0.1–0.9)
MONO%: 8.2 % (ref 0.0–14.0)
NEUT#: 3.7 10*3/uL (ref 1.5–6.5)
RBC: 4.04 10*6/uL (ref 3.70–5.45)
RDW: 15.8 % — ABNORMAL HIGH (ref 11.2–14.5)
WBC: 6.7 10*3/uL (ref 3.9–10.3)
nRBC: 0 % (ref 0–0)

## 2013-07-17 LAB — COMPREHENSIVE METABOLIC PANEL (CC13)
ALT: 11 U/L (ref 0–55)
AST: 17 U/L (ref 5–34)
Anion Gap: 10 mEq/L (ref 3–11)
CO2: 26 mEq/L (ref 22–29)
Calcium: 9.6 mg/dL (ref 8.4–10.4)
Chloride: 106 mEq/L (ref 98–109)
Sodium: 141 mEq/L (ref 136–145)
Total Bilirubin: 0.38 mg/dL (ref 0.20–1.20)
Total Protein: 7.5 g/dL (ref 6.4–8.3)

## 2013-07-17 MED ORDER — SODIUM CHLORIDE 0.9 % IV SOLN
Freq: Once | INTRAVENOUS | Status: AC
  Administered 2013-07-17: 12:00:00 via INTRAVENOUS

## 2013-07-17 MED ORDER — ZOLEDRONIC ACID 4 MG/100ML IV SOLN
4.0000 mg | Freq: Once | INTRAVENOUS | Status: AC
  Administered 2013-07-17: 4 mg via INTRAVENOUS
  Filled 2013-07-17: qty 100

## 2013-07-17 MED ORDER — FULVESTRANT 250 MG/5ML IM SOLN
500.0000 mg | Freq: Once | INTRAMUSCULAR | Status: AC
  Administered 2013-07-17: 500 mg via INTRAMUSCULAR
  Filled 2013-07-17: qty 10

## 2013-07-25 ENCOUNTER — Ambulatory Visit: Payer: Medicare Other | Attending: Orthopedic Surgery | Admitting: Physical Therapy

## 2013-07-25 DIAGNOSIS — C50919 Malignant neoplasm of unspecified site of unspecified female breast: Secondary | ICD-10-CM | POA: Insufficient documentation

## 2013-07-25 DIAGNOSIS — IMO0001 Reserved for inherently not codable concepts without codable children: Secondary | ICD-10-CM | POA: Insufficient documentation

## 2013-07-25 DIAGNOSIS — I890 Lymphedema, not elsewhere classified: Secondary | ICD-10-CM | POA: Insufficient documentation

## 2013-07-27 ENCOUNTER — Other Ambulatory Visit: Payer: Self-pay | Admitting: Physician Assistant

## 2013-07-27 DIAGNOSIS — Z853 Personal history of malignant neoplasm of breast: Secondary | ICD-10-CM

## 2013-08-01 ENCOUNTER — Encounter: Payer: Medicare Other | Admitting: Physical Therapy

## 2013-08-08 ENCOUNTER — Ambulatory Visit: Payer: Medicare Other | Admitting: Physical Therapy

## 2013-08-11 ENCOUNTER — Other Ambulatory Visit: Payer: Self-pay | Admitting: *Deleted

## 2013-08-11 DIAGNOSIS — C50512 Malignant neoplasm of lower-outer quadrant of left female breast: Secondary | ICD-10-CM

## 2013-08-14 ENCOUNTER — Other Ambulatory Visit (HOSPITAL_BASED_OUTPATIENT_CLINIC_OR_DEPARTMENT_OTHER): Payer: Medicare Other

## 2013-08-14 ENCOUNTER — Ambulatory Visit (HOSPITAL_BASED_OUTPATIENT_CLINIC_OR_DEPARTMENT_OTHER): Payer: Medicare Other

## 2013-08-14 VITALS — BP 114/59 | HR 73 | Temp 96.7°F | Resp 18

## 2013-08-14 DIAGNOSIS — Z5111 Encounter for antineoplastic chemotherapy: Secondary | ICD-10-CM

## 2013-08-14 DIAGNOSIS — C7952 Secondary malignant neoplasm of bone marrow: Secondary | ICD-10-CM

## 2013-08-14 DIAGNOSIS — C50019 Malignant neoplasm of nipple and areola, unspecified female breast: Secondary | ICD-10-CM

## 2013-08-14 DIAGNOSIS — C7951 Secondary malignant neoplasm of bone: Secondary | ICD-10-CM

## 2013-08-14 DIAGNOSIS — C50911 Malignant neoplasm of unspecified site of right female breast: Secondary | ICD-10-CM

## 2013-08-14 DIAGNOSIS — C50512 Malignant neoplasm of lower-outer quadrant of left female breast: Secondary | ICD-10-CM

## 2013-08-14 DIAGNOSIS — M899 Disorder of bone, unspecified: Secondary | ICD-10-CM

## 2013-08-14 LAB — COMPREHENSIVE METABOLIC PANEL (CC13)
ALBUMIN: 3.1 g/dL — AB (ref 3.5–5.0)
ALT: 9 U/L (ref 0–55)
AST: 14 U/L (ref 5–34)
Alkaline Phosphatase: 64 U/L (ref 40–150)
Anion Gap: 12 mEq/L — ABNORMAL HIGH (ref 3–11)
BUN: 13.3 mg/dL (ref 7.0–26.0)
CALCIUM: 9.8 mg/dL (ref 8.4–10.4)
CHLORIDE: 108 meq/L (ref 98–109)
CO2: 21 meq/L — AB (ref 22–29)
Creatinine: 0.8 mg/dL (ref 0.6–1.1)
GLUCOSE: 113 mg/dL (ref 70–140)
POTASSIUM: 4.1 meq/L (ref 3.5–5.1)
SODIUM: 141 meq/L (ref 136–145)
TOTAL PROTEIN: 7.2 g/dL (ref 6.4–8.3)
Total Bilirubin: 0.24 mg/dL (ref 0.20–1.20)

## 2013-08-14 LAB — CBC WITH DIFFERENTIAL/PLATELET
BASO%: 0.4 % (ref 0.0–2.0)
BASOS ABS: 0 10*3/uL (ref 0.0–0.1)
EOS ABS: 0.2 10*3/uL (ref 0.0–0.5)
EOS%: 4.1 % (ref 0.0–7.0)
HCT: 36.8 % (ref 34.8–46.6)
HEMOGLOBIN: 12 g/dL (ref 11.6–15.9)
LYMPH#: 1.9 10*3/uL (ref 0.9–3.3)
LYMPH%: 38.6 % (ref 14.0–49.7)
MCH: 28.5 pg (ref 25.1–34.0)
MCHC: 32.6 g/dL (ref 31.5–36.0)
MCV: 87.4 fL (ref 79.5–101.0)
MONO#: 0.4 10*3/uL (ref 0.1–0.9)
MONO%: 8.9 % (ref 0.0–14.0)
NEUT%: 48 % (ref 38.4–76.8)
NEUTROS ABS: 2.4 10*3/uL (ref 1.5–6.5)
Platelets: 234 10*3/uL (ref 145–400)
RBC: 4.21 10*6/uL (ref 3.70–5.45)
RDW: 15.5 % — AB (ref 11.2–14.5)
WBC: 4.9 10*3/uL (ref 3.9–10.3)

## 2013-08-14 MED ORDER — ZOLEDRONIC ACID 4 MG/100ML IV SOLN
4.0000 mg | Freq: Once | INTRAVENOUS | Status: AC
  Administered 2013-08-14: 4 mg via INTRAVENOUS
  Filled 2013-08-14: qty 100

## 2013-08-14 MED ORDER — ZOLEDRONIC ACID 4 MG/100ML IV SOLN
4.0000 mg | Freq: Once | INTRAVENOUS | Status: DC
  Filled 2013-08-14: qty 100

## 2013-08-14 MED ORDER — FULVESTRANT 250 MG/5ML IM SOLN
500.0000 mg | Freq: Once | INTRAMUSCULAR | Status: AC
  Administered 2013-08-14: 500 mg via INTRAMUSCULAR
  Filled 2013-08-14: qty 10

## 2013-08-14 MED ORDER — SODIUM CHLORIDE 0.9 % IV SOLN
Freq: Once | INTRAVENOUS | Status: AC
  Administered 2013-08-14: 12:00:00 via INTRAVENOUS

## 2013-08-14 NOTE — Patient Instructions (Signed)
Zoledronic Acid injection (Paget's Disease, Osteoporosis) What is this medicine? ZOLEDRONIC ACID (ZOE le dron ik AS id) lowers the amount of calcium loss from bone. It is used to treat Paget's disease and osteoporosis in women. This medicine may be used for other purposes; ask your health care provider or pharmacist if you have questions. COMMON BRAND NAME(S): Reclast, Zometa What should I tell my health care provider before I take this medicine? They need to know if you have any of these conditions: -aspirin-sensitive asthma -cancer, especially if you are receiving medicines used to treat cancer -dental disease or wear dentures -infection -kidney disease -low levels of calcium in the blood -past surgery on the parathyroid gland or intestines -receiving corticosteroids like dexamethasone or prednisone -an unusual or allergic reaction to zoledronic acid, other medicines, foods, dyes, or preservatives -pregnant or trying to get pregnant -breast-feeding How should I use this medicine? This medicine is for infusion into a vein. It is given by a health care professional in a hospital or clinic setting. Talk to your pediatrician regarding the use of this medicine in children. This medicine is not approved for use in children. Overdosage: If you think you have taken too much of this medicine contact a poison control center or emergency room at once. NOTE: This medicine is only for you. Do not share this medicine with others. What if I miss a dose? It is important not to miss your dose. Call your doctor or health care professional if you are unable to keep an appointment. What may interact with this medicine? -certain antibiotics given by injection -NSAIDs, medicines for pain and inflammation, like ibuprofen or naproxen -some diuretics like bumetanide, furosemide -teriparatide This list may not describe all possible interactions. Give your health care provider a list of all the medicines,  herbs, non-prescription drugs, or dietary supplements you use. Also tell them if you smoke, drink alcohol, or use illegal drugs. Some items may interact with your medicine. What should I watch for while using this medicine? Visit your doctor or health care professional for regular checkups. It may be some time before you see the benefit from this medicine. Do not stop taking your medicine unless your doctor tells you to. Your doctor may order blood tests or other tests to see how you are doing. Women should inform their doctor if they wish to become pregnant or think they might be pregnant. There is a potential for serious side effects to an unborn child. Talk to your health care professional or pharmacist for more information. You should make sure that you get enough calcium and vitamin D while you are taking this medicine. Discuss the foods you eat and the vitamins you take with your health care professional. Some people who take this medicine have severe bone, joint, and/or muscle pain. This medicine may also increase your risk for jaw problems or a broken thigh bone. Tell your doctor right away if you have severe pain in your jaw, bones, joints, or muscles. Tell your doctor if you have any pain that does not go away or that gets worse. Tell your dentist and dental surgeon that you are taking this medicine. You should not have major dental surgery while on this medicine. See your dentist to have a dental exam and fix any dental problems before starting this medicine. Take good care of your teeth while on this medicine. Make sure you see your dentist for regular follow-up appointments. What side effects may I notice from receiving this medicine?  Side effects that you should report to your doctor or health care professional as soon as possible: -allergic reactions like skin rash, itching or hives, swelling of the face, lips, or tongue -anxiety, confusion, or depression -breathing problems -changes in  vision -eye pain -feeling faint or lightheaded, falls -jaw pain, especially after dental work -mouth sores -muscle cramps, stiffness, or weakness -trouble passing urine or change in the amount of urine Side effects that usually do not require medical attention (report to your doctor or health care professional if they continue or are bothersome): -bone, joint, or muscle pain -constipation -diarrhea -fever -hair loss -irritation at site where injected -loss of appetite -nausea, vomiting -stomach upset -trouble sleeping -trouble swallowing -weak or tired This list may not describe all possible side effects. Call your doctor for medical advice about side effects. You may report side effects to FDA at 1-800-FDA-1088. Where should I keep my medicine? This drug is given in a hospital or clinic and will not be stored at home. NOTE: This sheet is a summary. It may not cover all possible information. If you have questions about this medicine, talk to your doctor, pharmacist, or health care provider.  2014, [employer] Standard. (2013-01-09 10:03:48)  Fulvestrant injection What is this medicine? FULVESTRANT (ful VES trant) blocks the effects of estrogen. It is used to treat breast cancer in women past the age of menopause. This medicine may be used for other purposes; ask your health care provider or pharmacist if you have questions. COMMON BRAND NAME(S): FASLODEX What should I tell my health care provider before I take this medicine? They need to know if you have any of these conditions: -bleeding problems -liver disease -low levels of platelets in the blood -an unusual or allergic reaction to fulvestrant, other medicines, foods, dyes, or preservatives -pregnant or trying to get pregnant -breast-feeding How should I use this medicine? This medicine is for injection into a muscle. It is usually given by a health care professional in a hospital or clinic setting. Talk to your  pediatrician regarding the use of this medicine in children. Special care may be needed. Overdosage: If you think you have taken too much of this medicine contact a poison control center or emergency room at once. NOTE: This medicine is only for you. Do not share this medicine with others. What if I miss a dose? It is important not to miss your dose. Call your doctor or health care professional if you are unable to keep an appointment. What may interact with this medicine? -medicines that treat or prevent blood clots like warfarin, enoxaparin, and dalteparin This list may not describe all possible interactions. Give your health care provider a list of all the medicines, herbs, non-prescription drugs, or dietary supplements you use. Also tell them if you smoke, drink alcohol, or use illegal drugs. Some items may interact with your medicine. What should I watch for while using this medicine? Your condition will be monitored carefully while you are receiving this medicine. You will need important blood work done while you are taking this medicine. Do not become pregnant while taking this medicine. Women should inform their doctor if they wish to become pregnant or think they might be pregnant. There is a potential for serious side effects to an unborn child. Talk to your health care professional or pharmacist for more information. What side effects may I notice from receiving this medicine? Side effects that you should report to your doctor or health care professional as soon as  possible: -allergic reactions like skin rash, itching or hives, swelling of the face, lips, or tongue -feeling faint or lightheaded, falls -fever or flu-like symptoms -sore throat -vaginal bleeding Side effects that usually do not require medical attention (report to your doctor or health care professional if they continue or are bothersome): -aches, pains -constipation or diarrhea -headache -hot flashes -nausea,  vomiting -pain at site where injected -stomach pain This list may not describe all possible side effects. Call your doctor for medical advice about side effects. You may report side effects to FDA at 1-800-FDA-1088. Where should I keep my medicine? This drug is given in a hospital or clinic and will not be stored at home. NOTE: This sheet is a summary. It may not cover all possible information. If you have questions about this medicine, talk to your doctor, pharmacist, or health care provider.  2014, [employer] Standard. (2007-12-05 15:39:24)

## 2013-08-15 ENCOUNTER — Ambulatory Visit: Payer: Medicare Other | Attending: Orthopedic Surgery | Admitting: Physical Therapy

## 2013-08-15 DIAGNOSIS — IMO0001 Reserved for inherently not codable concepts without codable children: Secondary | ICD-10-CM | POA: Insufficient documentation

## 2013-08-15 DIAGNOSIS — I890 Lymphedema, not elsewhere classified: Secondary | ICD-10-CM | POA: Insufficient documentation

## 2013-08-15 DIAGNOSIS — C50919 Malignant neoplasm of unspecified site of unspecified female breast: Secondary | ICD-10-CM | POA: Insufficient documentation

## 2013-08-21 ENCOUNTER — Ambulatory Visit (INDEPENDENT_AMBULATORY_CARE_PROVIDER_SITE_OTHER): Payer: Medicare Other | Admitting: Internal Medicine

## 2013-08-21 ENCOUNTER — Other Ambulatory Visit (INDEPENDENT_AMBULATORY_CARE_PROVIDER_SITE_OTHER): Payer: Medicare Other

## 2013-08-21 ENCOUNTER — Encounter: Payer: Self-pay | Admitting: Internal Medicine

## 2013-08-21 ENCOUNTER — Other Ambulatory Visit: Payer: Self-pay | Admitting: Oncology

## 2013-08-21 VITALS — BP 120/80 | HR 80 | Temp 98.3°F | Resp 16 | Ht 60.0 in | Wt 216.0 lb

## 2013-08-21 DIAGNOSIS — K5900 Constipation, unspecified: Secondary | ICD-10-CM | POA: Insufficient documentation

## 2013-08-21 DIAGNOSIS — M159 Polyosteoarthritis, unspecified: Secondary | ICD-10-CM

## 2013-08-21 DIAGNOSIS — M19019 Primary osteoarthritis, unspecified shoulder: Secondary | ICD-10-CM

## 2013-08-21 DIAGNOSIS — E785 Hyperlipidemia, unspecified: Secondary | ICD-10-CM

## 2013-08-21 DIAGNOSIS — E039 Hypothyroidism, unspecified: Secondary | ICD-10-CM

## 2013-08-21 DIAGNOSIS — IMO0001 Reserved for inherently not codable concepts without codable children: Secondary | ICD-10-CM

## 2013-08-21 DIAGNOSIS — E1165 Type 2 diabetes mellitus with hyperglycemia: Secondary | ICD-10-CM

## 2013-08-21 DIAGNOSIS — Z853 Personal history of malignant neoplasm of breast: Secondary | ICD-10-CM

## 2013-08-21 LAB — BASIC METABOLIC PANEL
BUN: 14 mg/dL (ref 6–23)
CHLORIDE: 106 meq/L (ref 96–112)
CO2: 28 mEq/L (ref 19–32)
Calcium: 9.3 mg/dL (ref 8.4–10.5)
Creatinine, Ser: 0.8 mg/dL (ref 0.4–1.2)
GFR: 89.38 mL/min (ref >60.00)
Glucose, Bld: 87 mg/dL (ref 70–99)
Potassium: 4 mEq/L (ref 3.5–5.1)
SODIUM: 141 meq/L (ref 135–145)

## 2013-08-21 LAB — HEMOGLOBIN A1C: Hgb A1c MFr Bld: 6.3 % (ref 4.6–6.5)

## 2013-08-21 LAB — TSH: TSH: 11.47 u[IU]/mL — AB (ref 0.35–5.50)

## 2013-08-21 LAB — HM DEXA SCAN

## 2013-08-21 MED ORDER — FENTANYL 25 MCG/HR TD PT72: 25.0000 ug | MEDICATED_PATCH | TRANSDERMAL | Status: DC

## 2013-08-21 MED ORDER — LEVOTHYROXINE SODIUM 175 MCG PO TABS: 175.0000 ug | ORAL_TABLET | Freq: Every day | ORAL | Status: DC

## 2013-08-21 MED ORDER — OXYCODONE HCL 5 MG PO TABS: ORAL_TABLET | ORAL | Status: DC

## 2013-08-21 MED ORDER — LINACLOTIDE 145 MCG PO CAPS: 145.0000 ug | ORAL_CAPSULE | Freq: Every day | ORAL | Status: DC

## 2013-08-21 NOTE — Patient Instructions (Signed)

## 2013-08-21 NOTE — Progress Notes (Signed)
Pre visit review using our clinic review tool, if applicable. No additional management support is needed unless otherwise documented below in the visit note. 

## 2013-08-21 NOTE — Progress Notes (Signed)
Subjective:    Patient ID: Christy Harrell, female    DOB: [birth year], 73 y.o.   MRN: [id number]  Constipation This is a chronic problem. The current episode started more than 1 year ago. The problem is unchanged. Her stool frequency is 2 to 3 times per week. The stool is described as formed and firm. The patient is on a high fiber diet. She does not exercise regularly. There has been adequate water intake. Pertinent negatives include no abdominal pain, anorexia, back pain, bloating, diarrhea, difficulty urinating, fecal incontinence, fever, flatus, hematochezia, hemorrhoids, melena, nausea, rectal pain, vomiting or weight loss. Risk factors include change in medication usage/dosage. She has tried stool softeners, laxatives, diet changes and fiber for the symptoms. The treatment provided mild relief.      Review of Systems  Constitutional: Positive for fatigue. Negative for fever, chills, weight loss, diaphoresis, appetite change and unexpected weight change.  HENT: Negative.   Eyes: Negative.   Respiratory: Negative.  Negative for cough, choking, chest tightness, shortness of breath, wheezing and stridor.   Cardiovascular: Negative.  Negative for chest pain, palpitations and leg swelling.  Gastrointestinal: Positive for constipation. Negative for nausea, vomiting, abdominal pain, diarrhea, melena, hematochezia, rectal pain, bloating, anorexia, flatus and hemorrhoids.  Endocrine: Negative.   Genitourinary: Negative.  Negative for difficulty urinating.  Musculoskeletal: Positive for arthralgias. Negative for back pain, gait problem, joint swelling, myalgias, neck pain and neck stiffness.  Skin: Negative.   Allergic/Immunologic: Negative.   Neurological: Negative.   Hematological: Negative.  Negative for adenopathy. Does not bruise/bleed easily.  Psychiatric/Behavioral: Negative.        Objective:   Physical Exam  Vitals reviewed. Constitutional: She is oriented to person, place, and  time. She appears well-developed and well-nourished. No distress.  HENT:  Head: Normocephalic and atraumatic.  Mouth/Throat: Oropharynx is clear and moist. No oropharyngeal exudate.  Eyes: Conjunctivae are normal. Right eye exhibits no discharge. Left eye exhibits no discharge. No scleral icterus.  Neck: Normal range of motion. Neck supple. No JVD present. No tracheal deviation present. No thyromegaly present.  Cardiovascular: Normal rate, regular rhythm, normal heart sounds and intact distal pulses.  Exam reveals no gallop and no friction rub.   No murmur heard. Pulmonary/Chest: Effort normal and breath sounds normal. No stridor. No respiratory distress. She has no wheezes. She has no rales. She exhibits no tenderness.  Abdominal: Soft. Bowel sounds are normal. She exhibits no distension and no mass. There is no tenderness. There is no rebound and no guarding.  Musculoskeletal: Normal range of motion. She exhibits no edema and no tenderness.  Lymphadenopathy:    She has no cervical adenopathy.  Neurological: She is oriented to person, place, and time.  Skin: Skin is warm and dry. No rash noted. She is not diaphoretic. No erythema. No pallor.  Psychiatric: She has a normal mood and affect. Her behavior is normal. Judgment and thought content normal.     Lab Results  Component Value Date   WBC 4.9 08/14/2013   HGB 12.0 08/14/2013   HCT 36.8 08/14/2013   PLT 234 08/14/2013   GLUCOSE 113 08/14/2013   CHOL 239* 03/16/2013   TRIG 105.0 03/16/2013   HDL 66.60 03/16/2013   LDLDIRECT 160.0 03/16/2013   ALT 9 08/14/2013   AST 14 08/14/2013   NA 141 08/14/2013   K 4.1 08/14/2013   CL 102 06/15/2013   CREATININE 0.8 08/14/2013   BUN 13.3 08/14/2013   CO2 21* 08/14/2013   TSH 0.06*  06/15/2013   HGBA1C 6.1 03/16/2013   MICROALBUR 0.4 03/16/2013       Assessment & Plan:

## 2013-08-22 ENCOUNTER — Encounter: Payer: Self-pay | Admitting: Internal Medicine

## 2013-08-22 NOTE — Assessment & Plan Note (Signed)
She will cont her current meds for pain

## 2013-08-22 NOTE — Assessment & Plan Note (Signed)
Her TSH is a little high so I have increased her synthroid dose

## 2013-08-22 NOTE — Assessment & Plan Note (Addendum)
I think this is caused by the opiates but her TSH was a little high as well, I will increase her synthroid dose and have asked her to try linzess

## 2013-08-22 NOTE — Assessment & Plan Note (Signed)
Her blood sugars are well controlled 

## 2013-08-31 ENCOUNTER — Telehealth: Payer: Self-pay

## 2013-08-31 ENCOUNTER — Ambulatory Visit
Admission: RE | Admit: 2013-08-31 | Discharge: 2013-08-31 | Disposition: A | Discharge status: Home / Self Care | Payer: Medicare Other | Source: Ambulatory Visit | Attending: Oncology | Admitting: Oncology

## 2013-08-31 DIAGNOSIS — Z853 Personal history of malignant neoplasm of breast: Secondary | ICD-10-CM

## 2013-08-31 DIAGNOSIS — K5900 Constipation, unspecified: Secondary | ICD-10-CM

## 2013-08-31 MED ORDER — LUBIPROSTONE 24 MCG PO CAPS
24.0000 ug | ORAL_CAPSULE | Freq: Two times a day (BID) | ORAL | Status: DC
Start: 2013-08-31 — End: 2014-10-19

## 2013-08-31 NOTE — Telephone Encounter (Signed)
Received pharmacy rejection stating that insurance will not cover linzess  without a prior authorization. PA was initiated but denied stating must try amitza, lactulose. Thanks

## 2013-08-31 NOTE — Telephone Encounter (Signed)
Changed to [facility]

## 2013-09-08 ENCOUNTER — Other Ambulatory Visit: Payer: Self-pay | Admitting: Hematology and Oncology

## 2013-09-08 DIAGNOSIS — C50919 Malignant neoplasm of unspecified site of unspecified female breast: Secondary | ICD-10-CM

## 2013-09-11 ENCOUNTER — Other Ambulatory Visit (HOSPITAL_BASED_OUTPATIENT_CLINIC_OR_DEPARTMENT_OTHER): Payer: Medicare Other

## 2013-09-11 ENCOUNTER — Ambulatory Visit: Payer: Medicare Other

## 2013-09-11 ENCOUNTER — Ambulatory Visit (HOSPITAL_BASED_OUTPATIENT_CLINIC_OR_DEPARTMENT_OTHER): Payer: Medicare Other

## 2013-09-11 VITALS — BP 107/68 | HR 80 | Temp 98.2°F | Resp 16

## 2013-09-11 DIAGNOSIS — C7952 Secondary malignant neoplasm of bone marrow: Secondary | ICD-10-CM

## 2013-09-11 DIAGNOSIS — M899 Disorder of bone, unspecified: Secondary | ICD-10-CM

## 2013-09-11 DIAGNOSIS — Z5111 Encounter for antineoplastic chemotherapy: Secondary | ICD-10-CM

## 2013-09-11 DIAGNOSIS — C7951 Secondary malignant neoplasm of bone: Secondary | ICD-10-CM

## 2013-09-11 DIAGNOSIS — C50919 Malignant neoplasm of unspecified site of unspecified female breast: Secondary | ICD-10-CM

## 2013-09-11 DIAGNOSIS — C50519 Malignant neoplasm of lower-outer quadrant of unspecified female breast: Secondary | ICD-10-CM

## 2013-09-11 LAB — COMPREHENSIVE METABOLIC PANEL (CC13)
ALBUMIN: 3.3 g/dL — AB (ref 3.5–5.0)
ALT: 10 U/L (ref 0–55)
AST: 16 U/L (ref 5–34)
Alkaline Phosphatase: 61 U/L (ref 40–150)
Anion Gap: 11 mEq/L (ref 3–11)
BUN: 10.1 mg/dL (ref 7.0–26.0)
CO2: 24 mEq/L (ref 22–29)
Calcium: 9.9 mg/dL (ref 8.4–10.4)
Chloride: 108 mEq/L (ref 98–109)
Creatinine: 0.8 mg/dL (ref 0.6–1.1)
GLUCOSE: 105 mg/dL (ref 70–140)
POTASSIUM: 4 meq/L (ref 3.5–5.1)
Sodium: 143 mEq/L (ref 136–145)
Total Bilirubin: 0.46 mg/dL (ref 0.20–1.20)
Total Protein: 7.3 g/dL (ref 6.4–8.3)

## 2013-09-11 LAB — CBC WITH DIFFERENTIAL/PLATELET
BASO%: 0.3 % (ref 0.0–2.0)
BASOS ABS: 0 10*3/uL (ref 0.0–0.1)
EOS ABS: 0.2 10*3/uL (ref 0.0–0.5)
EOS%: 3.1 % (ref 0.0–7.0)
HCT: 35.4 % (ref 34.8–46.6)
HEMOGLOBIN: 11.5 g/dL — AB (ref 11.6–15.9)
LYMPH#: 1.8 10*3/uL (ref 0.9–3.3)
LYMPH%: 30.5 % (ref 14.0–49.7)
MCH: 28.4 pg (ref 25.1–34.0)
MCHC: 32.5 g/dL (ref 31.5–36.0)
MCV: 87.4 fL (ref 79.5–101.0)
MONO#: 0.6 10*3/uL (ref 0.1–0.9)
MONO%: 10.3 % (ref 0.0–14.0)
NEUT%: 55.8 % (ref 38.4–76.8)
NEUTROS ABS: 3.3 10*3/uL (ref 1.5–6.5)
Platelets: 272 10*3/uL (ref 145–400)
RBC: 4.05 10*6/uL (ref 3.70–5.45)
RDW: 15.2 % — AB (ref 11.2–14.5)
WBC: 5.9 10*3/uL (ref 3.9–10.3)

## 2013-09-11 MED ORDER — ZOLEDRONIC ACID 4 MG/100ML IV SOLN
4.0000 mg | Freq: Once | INTRAVENOUS | Status: DC
  Filled 2013-09-11: qty 100

## 2013-09-11 MED ORDER — SODIUM CHLORIDE 0.9 % IV SOLN
Freq: Once | INTRAVENOUS | Status: AC
  Administered 2013-09-11: 12:00:00 via INTRAVENOUS

## 2013-09-11 MED ORDER — FULVESTRANT 250 MG/5ML IM SOLN
500.0000 mg | Freq: Once | INTRAMUSCULAR | Status: AC
  Administered 2013-09-11: 500 mg via INTRAMUSCULAR
  Filled 2013-09-11: qty 10

## 2013-09-11 MED ORDER — ZOLEDRONIC ACID 4 MG/100ML IV SOLN
4.0000 mg | Freq: Once | INTRAVENOUS | Status: AC
  Administered 2013-09-11: 4 mg via INTRAVENOUS
  Filled 2013-09-11: qty 100

## 2013-09-11 NOTE — Patient Instructions (Signed)
[facility] Discharge Instructions for Patients Receiving Chemotherapy  Today you received the following chemotherapy agents: Zometa, Faslodex  To help prevent nausea and vomiting after your treatment, we encourage you to take your nausea medication as prescribed.    If you develop nausea and vomiting that is not controlled by your nausea medication, call the clinic.   BELOW ARE SYMPTOMS THAT SHOULD BE REPORTED IMMEDIATELY:  *FEVER GREATER THAN 100.5 F  *CHILLS WITH OR WITHOUT FEVER  NAUSEA AND VOMITING THAT IS NOT CONTROLLED WITH YOUR NAUSEA MEDICATION  *UNUSUAL SHORTNESS OF BREATH  *UNUSUAL BRUISING OR BLEEDING  TENDERNESS IN MOUTH AND THROAT WITH OR WITHOUT PRESENCE OF ULCERS  *URINARY PROBLEMS  *BOWEL PROBLEMS  UNUSUAL RASH Items with * indicate a potential emergency and should be followed up as soon as possible.  Feel free to call the clinic you have any questions or concerns. The clinic phone number is [phone].

## 2013-09-13 ENCOUNTER — Emergency Department (HOSPITAL_COMMUNITY): Payer: Medicare Other

## 2013-09-13 ENCOUNTER — Encounter (HOSPITAL_COMMUNITY): Payer: Self-pay | Admitting: Emergency Medicine

## 2013-09-13 ENCOUNTER — Other Ambulatory Visit: Payer: Self-pay

## 2013-09-13 ENCOUNTER — Emergency Department (HOSPITAL_COMMUNITY)
Admission: EM | Admit: 2013-09-13 | Discharge: 2013-09-13 | Disposition: A | Discharge status: Home / Self Care | Payer: Medicare Other | Attending: Emergency Medicine | Admitting: Emergency Medicine

## 2013-09-13 DIAGNOSIS — Z79899 Other long term (current) drug therapy: Secondary | ICD-10-CM | POA: Insufficient documentation

## 2013-09-13 DIAGNOSIS — E785 Hyperlipidemia, unspecified: Secondary | ICD-10-CM | POA: Insufficient documentation

## 2013-09-13 DIAGNOSIS — I10 Essential (primary) hypertension: Secondary | ICD-10-CM | POA: Insufficient documentation

## 2013-09-13 DIAGNOSIS — Z923 Personal history of irradiation: Secondary | ICD-10-CM | POA: Insufficient documentation

## 2013-09-13 DIAGNOSIS — K219 Gastro-esophageal reflux disease without esophagitis: Secondary | ICD-10-CM | POA: Insufficient documentation

## 2013-09-13 DIAGNOSIS — M810 Age-related osteoporosis without current pathological fracture: Secondary | ICD-10-CM | POA: Insufficient documentation

## 2013-09-13 DIAGNOSIS — E119 Type 2 diabetes mellitus without complications: Secondary | ICD-10-CM | POA: Insufficient documentation

## 2013-09-13 DIAGNOSIS — R112 Nausea with vomiting, unspecified: Secondary | ICD-10-CM | POA: Insufficient documentation

## 2013-09-13 DIAGNOSIS — R0602 Shortness of breath: Secondary | ICD-10-CM | POA: Insufficient documentation

## 2013-09-13 DIAGNOSIS — R1110 Vomiting, unspecified: Secondary | ICD-10-CM

## 2013-09-13 DIAGNOSIS — M129 Arthropathy, unspecified: Secondary | ICD-10-CM | POA: Insufficient documentation

## 2013-09-13 DIAGNOSIS — Z789 Other specified health status: Secondary | ICD-10-CM | POA: Insufficient documentation

## 2013-09-13 DIAGNOSIS — E860 Dehydration: Secondary | ICD-10-CM | POA: Insufficient documentation

## 2013-09-13 DIAGNOSIS — Z87891 Personal history of nicotine dependence: Secondary | ICD-10-CM | POA: Insufficient documentation

## 2013-09-13 DIAGNOSIS — Z853 Personal history of malignant neoplasm of breast: Secondary | ICD-10-CM | POA: Insufficient documentation

## 2013-09-13 DIAGNOSIS — E079 Disorder of thyroid, unspecified: Secondary | ICD-10-CM | POA: Insufficient documentation

## 2013-09-13 DIAGNOSIS — Z85830 Personal history of malignant neoplasm of bone: Secondary | ICD-10-CM | POA: Insufficient documentation

## 2013-09-13 LAB — CBC WITH DIFFERENTIAL/PLATELET
Basophils Absolute: 0 10*3/uL (ref 0.0–0.1)
Basophils Relative: 0 % (ref 0–1)
EOS PCT: 0 % (ref 0–5)
Eosinophils Absolute: 0 10*3/uL (ref 0.0–0.7)
HCT: 33.6 % — ABNORMAL LOW (ref 36.0–46.0)
Hemoglobin: 11 g/dL — ABNORMAL LOW (ref 12.0–15.0)
LYMPHS ABS: 1 10*3/uL (ref 0.7–4.0)
Lymphocytes Relative: 11 % — ABNORMAL LOW (ref 12–46)
MCH: 29 pg (ref 26.0–34.0)
MCHC: 32.7 g/dL (ref 30.0–36.0)
MCV: 88.7 fL (ref 78.0–100.0)
Monocytes Absolute: 0.4 10*3/uL (ref 0.1–1.0)
Monocytes Relative: 5 % (ref 3–12)
NEUTROS PCT: 84 % — AB (ref 43–77)
Neutro Abs: 7.2 10*3/uL (ref 1.7–7.7)
Platelets: 260 10*3/uL (ref 150–400)
RBC: 3.79 MIL/uL — ABNORMAL LOW (ref 3.87–5.11)
RDW: 15 % (ref 11.5–15.5)
WBC: 8.6 10*3/uL (ref 4.0–10.5)

## 2013-09-13 LAB — COMPREHENSIVE METABOLIC PANEL
ALK PHOS: 63 U/L (ref 39–117)
ALT: 9 U/L (ref 0–35)
AST: 13 U/L (ref 0–37)
Albumin: 3.2 g/dL — ABNORMAL LOW (ref 3.5–5.2)
BUN: 10 mg/dL (ref 6–23)
CO2: 26 meq/L (ref 19–32)
Calcium: 8.9 mg/dL (ref 8.4–10.5)
Chloride: 103 mEq/L (ref 96–112)
Creatinine, Ser: 0.71 mg/dL (ref 0.50–1.10)
GFR, EST NON AFRICAN AMERICAN: 84 mL/min — AB (ref >90)
Glucose, Bld: 130 mg/dL — ABNORMAL HIGH (ref 70–99)
POTASSIUM: 3.9 meq/L (ref 3.7–5.3)
SODIUM: 140 meq/L (ref 137–147)
Total Bilirubin: 0.5 mg/dL (ref 0.3–1.2)
Total Protein: 7.2 g/dL (ref 6.0–8.3)

## 2013-09-13 LAB — POCT I-STAT TROPONIN I: Troponin i, poc: 0 ng/mL (ref 0.00–0.08)

## 2013-09-13 LAB — GLUCOSE, CAPILLARY: Glucose-Capillary: 118 mg/dL — ABNORMAL HIGH (ref 70–99)

## 2013-09-13 LAB — LIPASE, BLOOD: Lipase: 19 U/L (ref 11–59)

## 2013-09-13 MED ORDER — SODIUM CHLORIDE 0.9 % IV BOLUS (SEPSIS)
1000.0000 mL | Freq: Once | INTRAVENOUS | Status: AC
  Administered 2013-09-13: 1000 mL via INTRAVENOUS

## 2013-09-13 MED ORDER — METOCLOPRAMIDE HCL 10 MG PO TABS: 10.0000 mg | ORAL_TABLET | Freq: Four times a day (QID) | ORAL | Status: DC | PRN

## 2013-09-13 MED ORDER — ONDANSETRON HCL 4 MG/2ML IJ SOLN
4.0000 mg | Freq: Once | INTRAMUSCULAR | Status: AC
  Administered 2013-09-13: 4 mg via INTRAVENOUS
  Filled 2013-09-13: qty 2

## 2013-09-13 NOTE — Discharge Instructions (Signed)
Take reglan for nausea and vomiting.   Stay hydrated.   Follow up with your doctor.   Return to ER if you have dehydration, worse vomiting, severe pain, worse shortness of breath.

## 2013-09-13 NOTE — ED Notes (Signed)
Per EMS: Pt from home c/o NV, SOB, lightheadedness since this morning.  Pt vomited 4 times before EMS arrival.  VSS.

## 2013-09-13 NOTE — ED Provider Notes (Addendum)
CSN: [id number]     Arrival date & time 09/13/13  1124 History   First MD Initiated Contact with Patient 09/13/13 1214     Chief Complaint  Patient presents with  . Nausea  . Emesis  . Shortness of Breath   (Consider location/radiation/quality/duration/timing/severity/associated sxs/prior Treatment) The history is provided by the patient.  Christy Harrell is a 73 y.o. female hx of breast Ca in remission, HL, DM, HTN here with nausea, vomiting, lightheadedness. Stroke up this morning and suddenly had several episodes of nausea vomiting. Denies any abdominal pain or headache. Denies any chest pain but feels slightly short of breath. She has been having some nonproductive cough for several days. She has some subjective chills but no fever. Denies any diarrhea.    Past Medical History  Diagnosis Date  . Cancer     right breast  . Club foot   . Arthritis   . Thyroid disease   . Hyperlipidemia   . Diabetes mellitus   . Osteoporosis   . Hypertension   . GERD (gastroesophageal reflux disease)     watches diet  . Neuromuscular disorder     lt arm numb sometimes  . Wears glasses   . 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 History of radiation therapy 02/22/13- 03/13/13    left proximal humerus 3500 cGy 14 sessions  . Lymphedema     right arm   Past Surgical History  Procedure Laterality Date  . Cesarean section    . Thyroidectomy  2002  . Foot surgery      as a child for club foot  . Breast surgery  2012    rt lump-16 nodes-in [place]  . Cesarean section    . Colonoscopy    . Dilation and curettage of uterus    . Humerus im nail Left 12/27/2012    Procedure: INTRAMEDULLARY (IM) NAIL HUMERAL LEFT PATHOLOGIC FRACTURE;  Surgeon: Nita Sells, MD;  Location: [facility];  Service: Orthopedics;  Laterality: Left;   Family History  Problem Relation Age of Onset  . Cancer Neg Hx   . Arthritis Neg Hx   . Heart disease Neg Hx   .  Hyperlipidemia Neg Hx   . Hypertension Neg Hx   . Dementia Mother   . Hearing loss Mother    History  Substance Use Topics  . Smoking status: Former Smoker    Quit date: 12/23/1974  . Smokeless tobacco: Never Used  . Alcohol Use: No   OB History   Grav Para Term Preterm Abortions TAB SAB Ect Mult Living                 Review of Systems  Respiratory: Positive for shortness of breath.   Gastrointestinal: Positive for vomiting.  All other systems reviewed and are negative.    Allergies  Review of patient's allergies indicates no known allergies.  Home Medications   Current Outpatient Rx  Name  Route  Sig  Dispense  Refill  . diclofenac sodium (VOLTAREN) 1 % GEL   Topical   Apply 2 g topically 4 (four) times daily.   2 Tube   2   . Dietary Management Product (VASCULERA) TABS   Oral   Take 1 capsule by mouth daily.   30 tablet   11   . docusate sodium (COLACE) 100 MG capsule   Oral   Take 100 mg by mouth 2 (two) times daily as  needed for constipation.         . fentaNYL (DURAGESIC - DOSED MCG/HR) 25 MCG/HR patch   Transdermal   Place 1 patch (25 mcg total) onto the skin every 3 (three) days. Fill on or after 10/19/13   10 patch   0     Fill on or after 06/11/13   . furosemide (LASIX) 40 MG tablet   Oral   Take 40 mg by mouth daily.         . hyaluronate sodium (RADIAPLEXRX) GEL   Topical   Apply 1 application topically 2 (two) times daily. Apply to affected area after rad txs and bedtime         . levothyroxine (SYNTHROID) 175 MCG tablet   Oral   Take 1 tablet (175 mcg total) by mouth daily before breakfast.   90 tablet   1   . lubiprostone (AMITIZA) 24 MCG capsule   Oral   Take 1 capsule (24 mcg total) by mouth 2 (two) times daily with a meal.   180 capsule   1   . metFORMIN (GLUCOPHAGE) 500 MG tablet   Oral   Take 500 mg by mouth daily.         Marland Kitchen oxyCODONE (ROXICODONE) 5 MG immediate release tablet      1-2 every 4-6 hours prn pain  fill on or after 10/19/13   25 tablet   0   . Polyethyl Glycol-Propyl Glycol (SYSTANE ULTRA OP)   Both Eyes   Place 1 drop into both eyes daily.         . pravastatin (PRAVACHOL) 40 MG tablet   Oral   Take 1 tablet (40 mg total) by mouth daily.   90 tablet   3   . pregabalin (LYRICA) 75 MG capsule   Oral   Take 75 mg by mouth 2 (two) times daily. Not taking         . zolendronic acid (ZOMETA) 4 MG/5ML injection   Intravenous   Inject 4 mg into the vein every 30 (thirty) days.         . metoCLOPramide (REGLAN) 10 MG tablet   Oral   Take 1 tablet (10 mg total) by mouth every 6 (six) hours as needed for nausea.   15 tablet   0    BP 117/53  Pulse 70  Temp(Src) 98.3 F (36.8 C) (Oral)  Resp 20  SpO2 98% Physical Exam  Nursing note and vitals reviewed. Constitutional: She is oriented to person, place, and time.  Uncomfortable, nauseated   HENT:  Head: Normocephalic.  MM slightly dry   Eyes: Conjunctivae are normal. Pupils are equal, round, and reactive to light.  Neck: Normal range of motion. Neck supple.  Cardiovascular: Normal rate, regular rhythm and normal heart sounds.   Pulmonary/Chest: Effort normal and breath sounds normal. No respiratory distress. She has no wheezes. She has no rales.  Abdominal: Soft. Bowel sounds are normal. She exhibits no distension. There is no tenderness. There is no rebound and no guarding.  Musculoskeletal: Normal range of motion.  Neurological: She is alert and oriented to person, place, and time.  Skin: Skin is warm and dry.  Psychiatric: She has a normal mood and affect. Her behavior is normal. Judgment and thought content normal.    ED Course  Procedures (including critical care time) Labs Review Labs Reviewed  CBC WITH DIFFERENTIAL - Abnormal; Notable for the following:    RBC 3.79 (*)    Hemoglobin  11.0 (*)    HCT 33.6 (*)    Neutrophils Relative % 84 (*)    Lymphocytes Relative 11 (*)    All other components  within normal limits  COMPREHENSIVE METABOLIC PANEL - Abnormal; Notable for the following:    Glucose, Bld 130 (*)    Albumin 3.2 (*)    GFR calc non Af Amer 84 (*)    All other components within normal limits  GLUCOSE, CAPILLARY - Abnormal; Notable for the following:    Glucose-Capillary 118 (*)    All other components within normal limits  LIPASE, BLOOD  POCT I-STAT TROPONIN I   Imaging Review Dg Chest 2 View  09/13/2013   CLINICAL DATA:  Shortness of breath  EXAM: CHEST  2 VIEW  COMPARISON:  June 19, 2013  FINDINGS: The heart size and mediastinal contours are stable. There is no focal infiltrate, pulmonary edema, or pleural effusion. Patient is status post right breast surgery and right axillary dissection. Patient is status post prior fixation of proximal left humerus.  IMPRESSION: No active cardiopulmonary disease.   Electronically Signed   By: Abelardo Diesel M.D.   On: 09/13/2013 13:31    EKG Interpretation    Date/Time:  Wednesday September 13 2013 12:44:39 EST Ventricular Rate:  73 PR Interval:  140 QRS Duration: 130 QT Interval:  377 QTC Calculation: 415 R Axis:   -35 Text Interpretation:  Sinus rhythm Right bundle branch block nonspecific changes  No significant change since last tracing Confirmed by Eliel Dudding  MD, Aicia Babinski [phone]) on 09/13/2013 2:44:51 PM            MDM   1. Vomiting   2. Dehydration    Christy Harrell is a 73 y.o. female here with vomiting, cough. Likely viral syndrome. Will get labs, cxr, will hydrate and reassess.   2:45 PM CXR nl. Labs at baseline. Tolerated crackers after IVF and zofran. Likely viral syndrome. Will d/c home with prn reglan.    Wandra Arthurs, MD 09/13/13 1437  Wandra Arthurs, MD 09/13/13 [place] Mitchel Delduca, MD 09/13/13 [phone]

## 2013-09-15 ENCOUNTER — Other Ambulatory Visit: Payer: Self-pay | Admitting: *Deleted

## 2013-09-15 ENCOUNTER — Telehealth: Payer: Self-pay | Admitting: Oncology

## 2013-09-15 DIAGNOSIS — C50512 Malignant neoplasm of lower-outer quadrant of left female breast: Secondary | ICD-10-CM

## 2013-09-15 DIAGNOSIS — C7951 Secondary malignant neoplasm of bone: Secondary | ICD-10-CM

## 2013-09-15 NOTE — Telephone Encounter (Signed)
S/w the pt regarding her appts for 10/06/2013. Pt was not available on that date due to her mother goes to nursing facility. Pt s/w val whomis aware and is looking for another date to accomodate the pt.

## 2013-09-15 NOTE — Progress Notes (Signed)
Informed by treatment room nurse- pt needs additional appointments.  Noted pt had cancelled appointments due to transportation issues - new POF and order per MD obtained and entered.

## 2013-09-18 ENCOUNTER — Telehealth: Payer: Self-pay | Admitting: *Deleted

## 2013-09-18 ENCOUNTER — Other Ambulatory Visit: Payer: Self-pay | Admitting: Oncology

## 2013-09-18 NOTE — Telephone Encounter (Signed)
Lm informed the pt that 10/06/13 was not a good d/t that she had requested to see GCM. gv appt for 10/13/13 w/ labs@ 11am, ov@ 11:30am ,and tx to follow. Made the pt aware that i will mail a letter/avs...td

## 2013-09-18 NOTE — Telephone Encounter (Signed)
sw pt gv appt for 10/06/13 w/ labs@ 11am and ov@ 11:30am per her rerquest. Made the pt aware that tx will follow. i emailed MW to add the tx....td

## 2013-09-22 ENCOUNTER — Other Ambulatory Visit: Payer: Self-pay | Admitting: *Deleted

## 2013-09-27 LAB — HM DIABETES EYE EXAM

## 2013-10-02 ENCOUNTER — Encounter (HOSPITAL_COMMUNITY): Payer: Medicare Other

## 2013-10-02 ENCOUNTER — Ambulatory Visit (HOSPITAL_COMMUNITY): Payer: Medicare Other

## 2013-10-04 ENCOUNTER — Encounter (HOSPITAL_COMMUNITY): Payer: Medicare Other

## 2013-10-04 ENCOUNTER — Telehealth: Payer: Self-pay | Admitting: *Deleted

## 2013-10-04 NOTE — Telephone Encounter (Signed)
Patient called and unable to get out in the snow. She has r/s her appt for scans on 2/26 and now needs to move lab/MD to after the scans completed.

## 2013-10-05 ENCOUNTER — Telehealth: Payer: Self-pay | Admitting: Oncology

## 2013-10-05 ENCOUNTER — Other Ambulatory Visit: Payer: Self-pay | Admitting: *Deleted

## 2013-10-05 NOTE — Telephone Encounter (Signed)
, °

## 2013-10-06 ENCOUNTER — Telehealth: Payer: Self-pay | Admitting: *Deleted

## 2013-10-06 ENCOUNTER — Telehealth: Payer: Self-pay | Admitting: Oncology

## 2013-10-06 ENCOUNTER — Other Ambulatory Visit: Payer: Medicare Other

## 2013-10-06 ENCOUNTER — Ambulatory Visit: Payer: Medicare Other | Admitting: Oncology

## 2013-10-06 NOTE — Telephone Encounter (Signed)
Per staff message and POF I have scheduled appts.  JMW  

## 2013-10-06 NOTE — Telephone Encounter (Signed)
S/W THE PT AND SHE IS AWARE OF HER R/S APPTS FROM 10/13/2013 TO 10/20/2013.

## 2013-10-06 NOTE — Telephone Encounter (Signed)
SENT Christy Harrell A STAFF MESSAGE TO ADD THE PT IN FOR A ZOMETA APPT. PT IS AWARE OF THIS APPT ON Monday.

## 2013-10-13 ENCOUNTER — Ambulatory Visit: Payer: Medicare Other

## 2013-10-13 ENCOUNTER — Ambulatory Visit: Payer: Medicare Other | Admitting: Oncology

## 2013-10-13 ENCOUNTER — Telehealth: Payer: Self-pay | Admitting: *Deleted

## 2013-10-13 ENCOUNTER — Other Ambulatory Visit: Payer: Medicare Other

## 2013-10-13 NOTE — Telephone Encounter (Signed)
Patient called and moved appt from today to next week      JMW

## 2013-10-16 ENCOUNTER — Encounter (HOSPITAL_COMMUNITY)
Admission: RE | Admit: 2013-10-16 | Discharge: 2013-10-16 | Disposition: A | Discharge status: Home / Self Care | Payer: Medicare Other | Source: Ambulatory Visit | Attending: Oncology | Admitting: Oncology

## 2013-10-16 DIAGNOSIS — C50519 Malignant neoplasm of lower-outer quadrant of unspecified female breast: Secondary | ICD-10-CM | POA: Insufficient documentation

## 2013-10-16 DIAGNOSIS — C50512 Malignant neoplasm of lower-outer quadrant of left female breast: Secondary | ICD-10-CM

## 2013-10-16 DIAGNOSIS — C7952 Secondary malignant neoplasm of bone marrow: Secondary | ICD-10-CM

## 2013-10-16 DIAGNOSIS — C7951 Secondary malignant neoplasm of bone: Secondary | ICD-10-CM | POA: Insufficient documentation

## 2013-10-16 MED ORDER — TECHNETIUM TC 99M MEDRONATE IV KIT
26.0000 | PACK | Freq: Once | INTRAVENOUS | Status: AC | PRN
  Administered 2013-10-16: 26 via INTRAVENOUS

## 2013-10-17 ENCOUNTER — Ambulatory Visit (HOSPITAL_BASED_OUTPATIENT_CLINIC_OR_DEPARTMENT_OTHER): Payer: Medicare Other

## 2013-10-17 ENCOUNTER — Telehealth: Payer: Self-pay | Admitting: Oncology

## 2013-10-17 ENCOUNTER — Telehealth: Payer: Self-pay | Admitting: *Deleted

## 2013-10-17 VITALS — BP 119/102 | HR 75 | Temp 99.2°F

## 2013-10-17 DIAGNOSIS — C7952 Secondary malignant neoplasm of bone marrow: Secondary | ICD-10-CM

## 2013-10-17 DIAGNOSIS — M899 Disorder of bone, unspecified: Secondary | ICD-10-CM

## 2013-10-17 DIAGNOSIS — C7951 Secondary malignant neoplasm of bone: Secondary | ICD-10-CM

## 2013-10-17 DIAGNOSIS — C50519 Malignant neoplasm of lower-outer quadrant of unspecified female breast: Secondary | ICD-10-CM

## 2013-10-17 DIAGNOSIS — C50919 Malignant neoplasm of unspecified site of unspecified female breast: Secondary | ICD-10-CM

## 2013-10-17 DIAGNOSIS — Z5111 Encounter for antineoplastic chemotherapy: Secondary | ICD-10-CM

## 2013-10-17 LAB — COMPREHENSIVE METABOLIC PANEL (CC13)
ALT: 7 U/L (ref 0–55)
AST: 13 U/L (ref 5–34)
Albumin: 3 g/dL — ABNORMAL LOW (ref 3.5–5.0)
Alkaline Phosphatase: 62 U/L (ref 40–150)
Anion Gap: 10 mEq/L (ref 3–11)
BUN: 11.6 mg/dL (ref 7.0–26.0)
CALCIUM: 9.4 mg/dL (ref 8.4–10.4)
CO2: 23 mEq/L (ref 22–29)
CREATININE: 0.7 mg/dL (ref 0.6–1.1)
Chloride: 109 mEq/L (ref 98–109)
GLUCOSE: 111 mg/dL (ref 70–140)
Potassium: 4 mEq/L (ref 3.5–5.1)
Sodium: 141 mEq/L (ref 136–145)
Total Bilirubin: 0.34 mg/dL (ref 0.20–1.20)
Total Protein: 6.9 g/dL (ref 6.4–8.3)

## 2013-10-17 LAB — CBC WITH DIFFERENTIAL/PLATELET
BASO%: 0.5 % (ref 0.0–2.0)
BASOS ABS: 0 10*3/uL (ref 0.0–0.1)
EOS ABS: 0.2 10*3/uL (ref 0.0–0.5)
EOS%: 2.3 % (ref 0.0–7.0)
HEMATOCRIT: 34.4 % — AB (ref 34.8–46.6)
HEMOGLOBIN: 11.1 g/dL — AB (ref 11.6–15.9)
LYMPH%: 28.3 % (ref 14.0–49.7)
MCH: 28.3 pg (ref 25.1–34.0)
MCHC: 32.3 g/dL (ref 31.5–36.0)
MCV: 87.8 fL (ref 79.5–101.0)
MONO#: 0.7 10*3/uL (ref 0.1–0.9)
MONO%: 10.2 % (ref 0.0–14.0)
NEUT#: 3.9 10*3/uL (ref 1.5–6.5)
NEUT%: 58.7 % (ref 38.4–76.8)
Platelets: 256 10*3/uL (ref 145–400)
RBC: 3.92 10*6/uL (ref 3.70–5.45)
RDW: 14.6 % — ABNORMAL HIGH (ref 11.2–14.5)
WBC: 6.6 10*3/uL (ref 3.9–10.3)
lymph#: 1.9 10*3/uL (ref 0.9–3.3)

## 2013-10-17 MED ORDER — FULVESTRANT 250 MG/5ML IM SOLN
500.0000 mg | Freq: Once | INTRAMUSCULAR | Status: AC
  Administered 2013-10-17: 500 mg via INTRAMUSCULAR
  Filled 2013-10-17: qty 10

## 2013-10-17 MED ORDER — ZOLEDRONIC ACID 4 MG/100ML IV SOLN
4.0000 mg | Freq: Once | INTRAVENOUS | Status: AC
  Administered 2013-10-17: 4 mg via INTRAVENOUS
  Filled 2013-10-17: qty 100

## 2013-10-17 NOTE — Telephone Encounter (Signed)
, °

## 2013-10-17 NOTE — Telephone Encounter (Signed)
Per staff message and POF I have scheduled appts.  JMW  

## 2013-10-17 NOTE — Patient Instructions (Signed)
[facility] Discharge Instructions for Patients Receiving Chemotherapy  Today you received the following chemotherapy agents zometa/faslodex  To help prevent nausea and vomiting after your treatment, we encourage you to take your nausea medication as directed.     If you develop nausea and vomiting that is not controlled by your nausea medication, call the clinic.   BELOW ARE SYMPTOMS THAT SHOULD BE REPORTED IMMEDIATELY:  *FEVER GREATER THAN 100.5 F  *CHILLS WITH OR WITHOUT FEVER  NAUSEA AND VOMITING THAT IS NOT CONTROLLED WITH YOUR NAUSEA MEDICATION  *UNUSUAL SHORTNESS OF BREATH  *UNUSUAL BRUISING OR BLEEDING  TENDERNESS IN MOUTH AND THROAT WITH OR WITHOUT PRESENCE OF ULCERS  *URINARY PROBLEMS  *BOWEL PROBLEMS  UNUSUAL RASH Items with * indicate a potential emergency and should be followed up as soon as possible.  Feel free to call the clinic you have any questions or concerns. The clinic phone number is [phone].  

## 2013-10-20 ENCOUNTER — Encounter: Payer: Medicare Other | Admitting: Oncology

## 2013-10-20 ENCOUNTER — Other Ambulatory Visit: Payer: Medicare Other

## 2013-11-13 ENCOUNTER — Telehealth: Payer: Self-pay | Admitting: Oncology

## 2013-11-13 NOTE — Telephone Encounter (Signed)
, °

## 2013-11-15 ENCOUNTER — Telehealth: Payer: Self-pay | Admitting: *Deleted

## 2013-11-15 NOTE — Telephone Encounter (Signed)
I have adjusted 4/10 appt

## 2013-11-16 ENCOUNTER — Other Ambulatory Visit: Payer: Self-pay

## 2013-11-17 ENCOUNTER — Ambulatory Visit: Payer: Medicare Other | Admitting: Oncology

## 2013-11-17 ENCOUNTER — Ambulatory Visit (HOSPITAL_BASED_OUTPATIENT_CLINIC_OR_DEPARTMENT_OTHER): Payer: Medicare Other

## 2013-11-17 ENCOUNTER — Other Ambulatory Visit (HOSPITAL_BASED_OUTPATIENT_CLINIC_OR_DEPARTMENT_OTHER): Payer: Medicare Other

## 2013-11-17 ENCOUNTER — Telehealth: Payer: Self-pay | Admitting: *Deleted

## 2013-11-17 ENCOUNTER — Other Ambulatory Visit: Payer: Medicare Other

## 2013-11-17 ENCOUNTER — Ambulatory Visit (HOSPITAL_BASED_OUTPATIENT_CLINIC_OR_DEPARTMENT_OTHER): Payer: Medicare Other | Admitting: Oncology

## 2013-11-17 ENCOUNTER — Other Ambulatory Visit: Payer: Self-pay | Admitting: *Deleted

## 2013-11-17 ENCOUNTER — Telehealth: Payer: Self-pay | Admitting: Oncology

## 2013-11-17 VITALS — BP 159/73 | HR 74 | Temp 98.6°F | Resp 20 | Ht 60.0 in | Wt 214.7 lb

## 2013-11-17 DIAGNOSIS — C7951 Secondary malignant neoplasm of bone: Secondary | ICD-10-CM

## 2013-11-17 DIAGNOSIS — E785 Hyperlipidemia, unspecified: Secondary | ICD-10-CM

## 2013-11-17 DIAGNOSIS — E8989 Other postprocedural endocrine and metabolic complications and disorders: Secondary | ICD-10-CM

## 2013-11-17 DIAGNOSIS — C50919 Malignant neoplasm of unspecified site of unspecified female breast: Secondary | ICD-10-CM

## 2013-11-17 DIAGNOSIS — M5412 Radiculopathy, cervical region: Secondary | ICD-10-CM

## 2013-11-17 DIAGNOSIS — M19019 Primary osteoarthritis, unspecified shoulder: Secondary | ICD-10-CM

## 2013-11-17 DIAGNOSIS — C50519 Malignant neoplasm of lower-outer quadrant of unspecified female breast: Secondary | ICD-10-CM

## 2013-11-17 DIAGNOSIS — C50219 Malignant neoplasm of upper-inner quadrant of unspecified female breast: Secondary | ICD-10-CM

## 2013-11-17 DIAGNOSIS — M810 Age-related osteoporosis without current pathological fracture: Secondary | ICD-10-CM

## 2013-11-17 DIAGNOSIS — M4692 Unspecified inflammatory spondylopathy, cervical region: Secondary | ICD-10-CM

## 2013-11-17 DIAGNOSIS — C7952 Secondary malignant neoplasm of bone marrow: Secondary | ICD-10-CM

## 2013-11-17 DIAGNOSIS — Z5111 Encounter for antineoplastic chemotherapy: Secondary | ICD-10-CM

## 2013-11-17 DIAGNOSIS — I890 Lymphedema, not elsewhere classified: Secondary | ICD-10-CM

## 2013-11-17 DIAGNOSIS — C50512 Malignant neoplasm of lower-outer quadrant of left female breast: Secondary | ICD-10-CM

## 2013-11-17 LAB — COMPREHENSIVE METABOLIC PANEL (CC13)
ALBUMIN: 3.3 g/dL — AB (ref 3.5–5.0)
ALT: 7 U/L (ref 0–55)
ANION GAP: 9 meq/L (ref 3–11)
AST: 16 U/L (ref 5–34)
Alkaline Phosphatase: 64 U/L (ref 40–150)
BUN: 11.3 mg/dL (ref 7.0–26.0)
CHLORIDE: 106 meq/L (ref 98–109)
CO2: 28 meq/L (ref 22–29)
Calcium: 10 mg/dL (ref 8.4–10.4)
Creatinine: 0.8 mg/dL (ref 0.6–1.1)
Glucose: 101 mg/dl (ref 70–140)
POTASSIUM: 4 meq/L (ref 3.5–5.1)
SODIUM: 143 meq/L (ref 136–145)
TOTAL PROTEIN: 7.4 g/dL (ref 6.4–8.3)
Total Bilirubin: 0.34 mg/dL (ref 0.20–1.20)

## 2013-11-17 LAB — CBC WITH DIFFERENTIAL/PLATELET
BASO%: 0.5 % (ref 0.0–2.0)
Basophils Absolute: 0 10*3/uL (ref 0.0–0.1)
EOS ABS: 0.2 10*3/uL (ref 0.0–0.5)
EOS%: 3.3 % (ref 0.0–7.0)
HCT: 35.9 % (ref 34.8–46.6)
HEMOGLOBIN: 11.9 g/dL (ref 11.6–15.9)
LYMPH#: 1.9 10*3/uL (ref 0.9–3.3)
LYMPH%: 32.3 % (ref 14.0–49.7)
MCH: 29.2 pg (ref 25.1–34.0)
MCHC: 33.1 g/dL (ref 31.5–36.0)
MCV: 88 fL (ref 79.5–101.0)
MONO#: 0.5 10*3/uL (ref 0.1–0.9)
MONO%: 9.2 % (ref 0.0–14.0)
NEUT#: 3.1 10*3/uL (ref 1.5–6.5)
NEUT%: 54.7 % (ref 38.4–76.8)
Platelets: 248 10*3/uL (ref 145–400)
RBC: 4.08 10*6/uL (ref 3.70–5.45)
RDW: 14.6 % — ABNORMAL HIGH (ref 11.2–14.5)
WBC: 5.8 10*3/uL (ref 3.9–10.3)

## 2013-11-17 MED ORDER — ZOLEDRONIC ACID 4 MG/100ML IV SOLN
4.0000 mg | Freq: Once | INTRAVENOUS | Status: AC
  Administered 2013-11-17: 4 mg via INTRAVENOUS
  Filled 2013-11-17: qty 100

## 2013-11-17 MED ORDER — SODIUM CHLORIDE 0.9 % IJ SOLN
10.0000 mL | INTRAMUSCULAR | Status: DC | PRN
  Filled 2013-11-17: qty 10

## 2013-11-17 MED ORDER — SODIUM CHLORIDE 0.9 % IV SOLN
INTRAVENOUS | Status: DC
  Administered 2013-11-17: 12:00:00 via INTRAVENOUS

## 2013-11-17 MED ORDER — HEPARIN SOD (PORK) LOCK FLUSH 100 UNIT/ML IV SOLN
500.0000 [IU] | Freq: Once | INTRAVENOUS | Status: DC
  Filled 2013-11-17: qty 5

## 2013-11-17 MED ORDER — FULVESTRANT 250 MG/5ML IM SOLN
500.0000 mg | Freq: Once | INTRAMUSCULAR | Status: AC
  Administered 2013-11-17: 500 mg via INTRAMUSCULAR
  Filled 2013-11-17: qty 10

## 2013-11-17 NOTE — Patient Instructions (Signed)
[facility] Discharge Instructions for Patients Receiving Chemotherapy  Today you received the following chemotherapy agents: faslodex  To help prevent nausea and vomiting after your treatment, we encourage you to take your nausea medication.  Take it as often as prescribed.     If you develop nausea and vomiting that is not controlled by your nausea medication, call the clinic. If it is after clinic hours your family physician or the after hours number for the clinic or go to the Emergency Department.   BELOW ARE SYMPTOMS THAT SHOULD BE REPORTED IMMEDIATELY:  *FEVER GREATER THAN 100.5 F  *CHILLS WITH OR WITHOUT FEVER  NAUSEA AND VOMITING THAT IS NOT CONTROLLED WITH YOUR NAUSEA MEDICATION  *UNUSUAL SHORTNESS OF BREATH  *UNUSUAL BRUISING OR BLEEDING  TENDERNESS IN MOUTH AND THROAT WITH OR WITHOUT PRESENCE OF ULCERS  *URINARY PROBLEMS  *BOWEL PROBLEMS  UNUSUAL RASH Items with * indicate a potential emergency and should be followed up as soon as possible.  Feel free to call the clinic you have any questions or concerns. The clinic phone number is [phone].   I have been informed and understand all the instructions given to me. I know to contact the clinic, my physician, or go to the Emergency Department if any problems should occur. I do not have any questions at this time, but understand that I may call the clinic during office hours   should I have any questions or need assistance in obtaining follow up care.    __________________________________________  _____________  __________ Signature of Patient or Authorized Representative            Date                   Time    __________________________________________ Nurse's Signature    Zoledronic Acid injection (Hypercalcemia, Oncology) (zometa) What is this medicine? ZOLEDRONIC ACID (ZOE le dron ik AS id) lowers the amount of calcium loss from bone. It is used to treat too much calcium in your blood  from cancer. It is also used to prevent complications of cancer that has spread to the bone. This medicine may be used for other purposes; ask your health care provider or pharmacist if you have questions. COMMON BRAND NAME(S): Zometa What should I tell my health care provider before I take this medicine? They need to know if you have any of these conditions: -aspirin-sensitive asthma -cancer, especially if you are receiving medicines used to treat cancer -dental disease or wear dentures -infection -kidney disease -receiving corticosteroids like dexamethasone or prednisone -an unusual or allergic reaction to zoledronic acid, other medicines, foods, dyes, or preservatives -pregnant or trying to get pregnant -breast-feeding How should I use this medicine? This medicine is for infusion into a vein. It is given by a health care professional in a hospital or clinic setting. Talk to your pediatrician regarding the use of this medicine in children. Special care may be needed. Overdosage: If you think you have taken too much of this medicine contact a poison control center or emergency room at once. NOTE: This medicine is only for you. Do not share this medicine with others. What if I miss a dose? It is important not to miss your dose. Call your doctor or health care professional if you are unable to keep an appointment. What may interact with this medicine? -certain antibiotics given by injection -NSAIDs, medicines for pain and inflammation, like ibuprofen or naproxen -some diuretics like bumetanide, furosemide -teriparatide -thalidomide This  list may not describe all possible interactions. Give your health care provider a list of all the medicines, herbs, non-prescription drugs, or dietary supplements you use. Also tell them if you smoke, drink alcohol, or use illegal drugs. Some items may interact with your medicine. What should I watch for while using this medicine? Visit your doctor or  health care professional for regular checkups. It may be some time before you see the benefit from this medicine. Do not stop taking your medicine unless your doctor tells you to. Your doctor may order blood tests or other tests to see how you are doing. Women should inform their doctor if they wish to become pregnant or think they might be pregnant. There is a potential for serious side effects to an unborn child. Talk to your health care professional or pharmacist for more information. You should make sure that you get enough calcium and vitamin D while you are taking this medicine. Discuss the foods you eat and the vitamins you take with your health care professional. Some people who take this medicine have severe bone, joint, and/or muscle pain. This medicine may also increase your risk for jaw problems or a broken thigh bone. Tell your doctor right away if you have severe pain in your jaw, bones, joints, or muscles. Tell your doctor if you have any pain that does not go away or that gets worse. Tell your dentist and dental surgeon that you are taking this medicine. You should not have major dental surgery while on this medicine. See your dentist to have a dental exam and fix any dental problems before starting this medicine. Take good care of your teeth while on this medicine. Make sure you see your dentist for regular follow-up appointments. What side effects may I notice from receiving this medicine? Side effects that you should report to your doctor or health care professional as soon as possible: -allergic reactions like skin rash, itching or hives, swelling of the face, lips, or tongue -anxiety, confusion, or depression -breathing problems -changes in vision -eye pain -feeling faint or lightheaded, falls -jaw pain, especially after dental work -mouth sores -muscle cramps, stiffness, or weakness -trouble passing urine or change in the amount of urine Side effects that usually do not require  medical attention (report to your doctor or health care professional if they continue or are bothersome): -bone, joint, or muscle pain -constipation -diarrhea -fever -hair loss -irritation at site where injected -loss of appetite -nausea, vomiting -stomach upset -trouble sleeping -trouble swallowing -weak or tired This list may not describe all possible side effects. Call your doctor for medical advice about side effects. You may report side effects to FDA at 1-800-FDA-1088. Where should I keep my medicine? This drug is given in a hospital or clinic and will not be stored at home. NOTE: This sheet is a summary. It may not cover all possible information. If you have questions about this medicine, talk to your doctor, pharmacist, or health care provider.  2014, [employer]. (2013-01-05 13:03:13)

## 2013-11-17 NOTE — Telephone Encounter (Signed)
, °

## 2013-11-17 NOTE — Telephone Encounter (Signed)
Per staff phone call and POF I have schedueld appts.  JMW  

## 2013-11-17 NOTE — Progress Notes (Signed)
ID: Christy Harrell   DOB: 11-27-1940  MR#: [id number]  ESL#:[id number]  PCP: Christy Calico, MD GYN:  SU:  OTHER MD:  Christy Harrell, Christy Harrell, Christy Harrell   HISTORY OF PRESENT ILLNESS: Christy patient had routine screening mammography 10/16/2010 showing a lobulated mass in Christy right subareolar region measuring up to 5.5 cm. Christy Left breast showed some central microcalcifications. Biopsy of Christy Right breast mass on 10/28/2010 showed an invasive ductal carcinoma, grade 2, estrogen receptor positive (Allred score 8), progesterone receptor positive (Allred score 5), with an equivocal HER-2. Christy Left breast area of microcalcifications was biopsied at Christy same time, and was read as suspicious for DCIS.  On 11/17/2010 Christy patient underwent Right lumpectomy and axillary lymph node dissection for what proved to be a 3 cm invasive ductal carcinoma, grade 2, with some papillary and mucinous features. One of 16 lymph nodes was involved. FISH showed no HER-2 amplification. Left breast biopsy was benign.  Christy patient had an Oncotype sent, with a score of 27, predicting a risk of distant recurrence after 5 years of tamoxifen in Christy 18% range. With this intermediate result, Christy decision was made not to proceed with chemotherapy, since Christy patient is Christy sole caregiver to her very elderly Harrell. Instead Christy patient proceeded to radiation treatment which was completed 03/06/2011. She started anastrozole at that point. Her subsequent history is as detailed below   INTERVAL HISTORY: Christy Harrell returns today for followup of her metastatic breast carcinoma with  bone involvement. Since her last visit here she has had a repeat bone scan, chest x-ray, and mammogram, none of which show evidence of disease progression. She continues on fulvestrant and zolendronate with good tolerance. She would like to change her treatments back to "Christy first Monday of each month".  REVIEW OF SYSTEMS: She has  multiple chronic pain problems and is followed through Christy pain clinic for this. She also has some hot flashes. There has been some constipation likely secondary to Christy narcotics. Christy pain is chronically in Christy back and shoulders but she feels there has been further progression of Christy pain particularly in Christy right upper back area and in Christy upper spine. She denies fevers, rash, bleeding, nausea, vomiting dizziness, gait imbalance, visual changes, or unusual headaches. A detailed review of systems is otherwise stable  PAST MEDICAL HISTORY: Past Medical History  Diagnosis Date  . Cancer     right breast  . Club foot   . Arthritis   . Thyroid disease   . Hyperlipidemia   . Diabetes mellitus   . Osteoporosis   . Hypertension   . GERD (gastroesophageal reflux disease)     watches diet  . Neuromuscular disorder     lt arm numb sometimes  . Wears glasses   . 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 History of radiation therapy 02/22/13- 03/13/13    left proximal humerus 3500 cGy 14 sessions  . Lymphedema     right arm    PAST SURGICAL HISTORY: Past Surgical History  Procedure Laterality Date  . Cesarean section    . Thyroidectomy  2002  . Foot surgery      as a child for club foot  . Breast surgery  2012    rt lump-16 nodes-in [place]  . Cesarean section    . Colonoscopy    . Dilation and curettage of uterus    . Humerus im nail Left 12/27/2012  Procedure: INTRAMEDULLARY (IM) NAIL HUMERAL LEFT PATHOLOGIC FRACTURE;  Surgeon: Christy Sells, MD;  Location: [facility];  Service: Orthopedics;  Laterality: Left;    FAMILY HISTORY Family History  Problem Relation Age of Onset  . Cancer Neg Hx   . Arthritis Neg Hx   . Heart disease Neg Hx   . Hyperlipidemia Neg Hx   . Hypertension Neg Hx   . Dementia Harrell   . Hearing loss Harrell    Christy Harrell died from unknown causes. Christy Harrell is alive at age 26. Christy patient was a  single child. There is no history of breast or ovarian cancer in Christy family to her knowledge.  GYNECOLOGIC HISTORY: Menarche age 24, menopause in her early 34s. Christy patient is GX P1, first pregnancy to term age 45. She did not take hormone replacement  SOCIAL HISTORY: Christy patient grew up in [place], attended [employer], then moved to [place] where she lived about 40 years. 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 Her Harrell, Christy Harrell, 99, lives with Christy patient about participates in progress for Christy elderly which give this alone you a little bit of time to herself. This doesn't mean that she cannot have early morning appointments.. Christy Harrell Christy Harrell, 84, is disabled secondary to pulmonary hypertension. She can be reached at [phone]. Christy patient has 2 grandchildren, Christy Harrell who works at a Scientist, physiological business and is 73 years old, in general, 53, completing high school.   ADVANCED DIRECTIVES:  HEALTH MAINTENANCE: History  Substance Use Topics  . Smoking status: Former Smoker    Quit date: 12/23/1974  . Smokeless tobacco: Never Used  . Alcohol Use: No     Colonoscopy:  PAP:    Bone density:  2012, on ibandronate chronically  Lipid panel: UTD  No Known Allergies  Current Outpatient Prescriptions  Medication Sig Dispense Refill  . diclofenac sodium (VOLTAREN) 1 % GEL Apply 2 g topically 4 (four) times daily.  2 Tube  2  . Dietary Management Product (VASCULERA) TABS Take 1 capsule by mouth daily.  30 tablet  11  . docusate sodium (COLACE) 100 MG capsule Take 100 mg by mouth 2 (two) times daily as needed for constipation.      . fentaNYL (DURAGESIC - DOSED MCG/HR) 25 MCG/HR patch Place 1 patch (25 mcg total) onto Christy skin every 3 (three) days. Fill on or after 10/19/13  10 patch  0  . furosemide (LASIX) 40 MG tablet Take 40 mg by mouth daily.      . hyaluronate sodium (RADIAPLEXRX) GEL Apply 1 application topically 2  (two) times daily. Apply to affected area after rad txs and bedtime      . levothyroxine (SYNTHROID) 175 MCG tablet Take 1 tablet (175 mcg total) by mouth daily before breakfast.  90 tablet  1  . lubiprostone (AMITIZA) 24 MCG capsule Take 1 capsule (24 mcg total) by mouth 2 (two) times daily with a meal.  180 capsule  1  . metFORMIN (GLUCOPHAGE) 500 MG tablet Take 500 mg by mouth daily.      . metoCLOPramide (REGLAN) 10 MG tablet Take 1 tablet (10 mg total) by mouth every 6 (six) hours as needed for nausea.  15 tablet  0  . oxyCODONE (ROXICODONE) 5 MG immediate release tablet 1-2 every 4-6 hours prn pain fill on or after 10/19/13  25 tablet  0  . Polyethyl Glycol-Propyl Glycol (  SYSTANE ULTRA OP) Place 1 drop into both eyes daily.      . pravastatin (PRAVACHOL) 40 MG tablet Take 1 tablet (40 mg total) by mouth daily.  90 tablet  3  . pregabalin (LYRICA) 75 MG capsule Take 75 mg by mouth 2 (two) times daily. Not taking      . zolendronic acid (ZOMETA) 4 MG/5ML injection Inject 4 mg into Christy vein every 30 (thirty) days.       No current facility-administered medications for this visit.   PHYSICAL EXAM: Elderly African American woman who appears stated age 73 Vitals:   11/17/13 1059  BP: 159/73  Pulse: 74  Temp: 98.6 F (37 C)  Resp: 20   Body mass index is 41.93 kg/(m^2).    ECOG FS: 2 Filed Weights   11/17/13 1059  Weight: 214 lb 11.2 oz (97.387 kg)    Sclerae unicteric, pupils equal and r were Oropharynx clear and moist-- dentition in poor repair No cervical or supraclavicular adenopathy Lungs no rales or rhonchi Heart regular rate and rhythm Abd soft, nontender, positive bowel sounds MSK no focal spinal tenderness, chronic grade 2 right upper extremity lymphedema Neuro: nonfocal, well oriented, appropriate affect Breasts: Christy right breast is status post prior lumpectomy. There is no evidence of local recurrence. Christy right axilla is benign. Christy left breast is unremarkable   LAB  RESULTS: Lab Results  Component Value Date   WBC 6.6 10/17/2013   NEUTROABS 3.9 10/17/2013   HGB 11.1* 10/17/2013   HCT 34.4* 10/17/2013   MCV 87.8 10/17/2013   PLT 256 10/17/2013      Chemistry      Component Value Date/Time   NA 141 10/17/2013 1118   NA 140 09/13/2013 1300   K 4.0 10/17/2013 1118   K 3.9 09/13/2013 1300   CL 103 09/13/2013 1300   CL 105 01/23/2013 1337   CO2 23 10/17/2013 1118   CO2 26 09/13/2013 1300   BUN 11.6 10/17/2013 1118   BUN 10 09/13/2013 1300   CREATININE 0.7 10/17/2013 1118   CREATININE 0.71 09/13/2013 1300      Component Value Date/Time   CALCIUM 9.4 10/17/2013 1118   CALCIUM 8.9 09/13/2013 1300   ALKPHOS 62 10/17/2013 1118   ALKPHOS 63 09/13/2013 1300   AST 13 10/17/2013 1118   AST 13 09/13/2013 1300   ALT 7 10/17/2013 1118   ALT 9 09/13/2013 1300   BILITOT 0.34 10/17/2013 1118   BILITOT 0.5 09/13/2013 1300       Lab Results  Component Value Date   LABCA2 24 11/19/2011    STUDIES: NUCLEAR MEDICINE WHOLE BODY BONE SCAN  TECHNIQUE:  Whole body anterior and posterior images were obtained approximately  3 hours after intravenous injection of radiopharmaceutical.  COMPARISON: NM BONE WHOLE BODY dated 05/08/2013; NM PET IMAGE RESTAG  (PS) SKULL BASE TO THIGH dated 01/13/2013  RADIOPHARMACEUTICALS: 26.0 mCi Technetium-99 MDP  FINDINGS:  Bilateral renal function and excretion present. Activity noted over  Christy of right breast suggest Christy possibility of inflammatory or  malignant change in Christy right breast. Stable mild increased activity  about Christy left shoulder is again noted. Similar findings noted on  prior exams. No other focal abnormality or new abnormalities  identified.  IMPRESSION:  1. Increased soft tissue uptake right breast. This could be from  inflammatory or malignant change.  2. Stable persistent mild increased activity left shoulder. No new  findings are noted.  Electronically Signed  By: Marcello Moores Register  On: 10/16/2013 14:37   CHEST 2 VIEW   COMPARISON: June 19, 2013  FINDINGS:  Christy heart size and mediastinal contours are stable. There is no  focal infiltrate, pulmonary edema, or pleural effusion. Patient is  status post right breast surgery and right axillary dissection.  Patient is status post prior fixation of proximal left humerus.  IMPRESSION:  No active cardiopulmonary disease.  Electronically Signed  By: Abelardo Diesel M.D.  On: 09/13/2013 13:31   DIGITAL DIAGNOSTIC BILATERAL MAMMOGRAM WITH TOMOSYNTHESIS AND CAD  COMPARISON: Prior examinations dating back to 10/16/2010.  ACR Breast Density Category a: Christy breast tissue is almost entirely  fatty.  FINDINGS:  Stable postlumpectomy changes right breast and excisional biopsy  changes left breast. No mammographic evidence for malignancy. No  concerning masses, calcifications or nonsurgical architectural  distortion.  Mammographic images were processed with CAD.  IMPRESSION:  No mammographic evidence for malignancy.  Stable postsurgical changes within Christy bilateral breast.  RECOMMENDATION:  Bilateral diagnostic mammography in 1 year.  I have discussed Christy findings and recommendations with Christy patient.  Results were also provided in writing at Christy conclusion of Christy  visit.  BI-RADS CATEGORY 2: Benign finding(s).  Electronically Signed  By: Lovey Newcomer M.D.  On: 08/31/2013 11:55   ASSESSMENT: 73 y.o.  [place] woman with stage IV [bone only] breast cancer in a  (1)  status post right lumpectomy and axillary lymph node dissection 11/17/2010 for a pT2 pN1, stage IIB invasive ductal carcinoma, grade 2, estrogen and progesterone receptor positive, HER-2 negative,   (2) Oncotype DX recurrence score of 27 predicting a risk of distant recurrence of 18% with 5 years of tamoxifen (intermediate score);   (3)  status post radiation completed July of 2012,   (4) started anastrozole July 2012, discontinued June 2014 with evidence of metastatic spread to bone  (5)  Chronic lymphedema in Christy right upper extremity.  (6) pathologic fracture of Christy left humerus along apparent lytic lesion, with no other bone lesions per bone scan 12/12/2012  (7) on 12/27/2012 underwent #1 intramedullary nail left pathologic proximal humerus fracture  #2 left shoulder rotator cuff repair  #3 left shoulder open bone biopsy with pathology showing metastatic breast adenocarcinoma, estrogen receptor 100% positive, progesterone receptor and HER-2 negative, with an MIB-1 of 26%. PET scan 01/13/2013 showed no other areas of disease and  (8) status post 35 Gy to Christy left proximal humerus completed 03/13/2013(  (9) fulvestrant, first injections on 01/23/2013  (10 zolendronic acid given monthly, first dose 01/23/2013  PLAN:  She is tolerating her treatment well, and there is no evidence of disease progression. I remain concerned that her dentition is extremely poor and she still has not established herself with a dentist.   She has chronic back and shoulder problems, but by her account there is worsening range of motion in her shoulders and more pain in her upper back. I think it would be worthwhile to obtain a CT of Christy chest just to make sure we are not missing disease progression.  Assuming that Christy CT scan proves reassuring, we will continue Christy monthly fulvestrant and zolendronate. I have again urged her to find a dentist. She will see [place] again with her August treatment. She knows to call for any problems that may develop before that visit.  Virgie Dad Lavin Petteway    11/17/2013

## 2013-11-22 ENCOUNTER — Telehealth: Payer: Self-pay | Admitting: Internal Medicine

## 2013-11-22 DIAGNOSIS — K5900 Constipation, unspecified: Secondary | ICD-10-CM

## 2013-11-22 DIAGNOSIS — M159 Polyosteoarthritis, unspecified: Secondary | ICD-10-CM

## 2013-11-22 MED ORDER — METFORMIN HCL 500 MG PO TABS: 500.0000 mg | ORAL_TABLET | Freq: Every day | ORAL | Status: DC

## 2013-11-22 MED ORDER — OXYCODONE HCL 5 MG PO TABS: ORAL_TABLET | ORAL | Status: DC

## 2013-11-22 NOTE — Telephone Encounter (Signed)
Requesting refills on Metformin, oxycodone.  She also wants a referral for a colonoscopy for problems with bowls.

## 2013-11-22 NOTE — Telephone Encounter (Signed)
Pt notified that her rx ready for pick up.

## 2013-11-22 NOTE — Telephone Encounter (Signed)
done

## 2013-11-22 NOTE — Telephone Encounter (Signed)
Please advise on oxy and referral. Thanks

## 2013-11-23 ENCOUNTER — Encounter: Payer: Self-pay | Admitting: Internal Medicine

## 2013-11-27 ENCOUNTER — Ambulatory Visit (INDEPENDENT_AMBULATORY_CARE_PROVIDER_SITE_OTHER): Payer: Medicare Other | Admitting: Internal Medicine

## 2013-11-27 ENCOUNTER — Encounter: Payer: Self-pay | Admitting: Internal Medicine

## 2013-11-27 ENCOUNTER — Other Ambulatory Visit (INDEPENDENT_AMBULATORY_CARE_PROVIDER_SITE_OTHER): Payer: Medicare Other

## 2013-11-27 VITALS — BP 108/56 | HR 95 | Temp 97.2°F | Resp 16 | Ht 60.0 in | Wt 209.2 lb

## 2013-11-27 DIAGNOSIS — R5381 Other malaise: Secondary | ICD-10-CM

## 2013-11-27 DIAGNOSIS — K5900 Constipation, unspecified: Secondary | ICD-10-CM

## 2013-11-27 DIAGNOSIS — E785 Hyperlipidemia, unspecified: Secondary | ICD-10-CM

## 2013-11-27 DIAGNOSIS — E876 Hypokalemia: Secondary | ICD-10-CM

## 2013-11-27 DIAGNOSIS — R5383 Other fatigue: Secondary | ICD-10-CM

## 2013-11-27 DIAGNOSIS — E1165 Type 2 diabetes mellitus with hyperglycemia: Secondary | ICD-10-CM

## 2013-11-27 DIAGNOSIS — IMO0001 Reserved for inherently not codable concepts without codable children: Secondary | ICD-10-CM

## 2013-11-27 DIAGNOSIS — E039 Hypothyroidism, unspecified: Secondary | ICD-10-CM

## 2013-11-27 DIAGNOSIS — M159 Polyosteoarthritis, unspecified: Secondary | ICD-10-CM

## 2013-11-27 DIAGNOSIS — R9431 Abnormal electrocardiogram [ECG] [EKG]: Secondary | ICD-10-CM

## 2013-11-27 LAB — TSH: TSH: 2.97 u[IU]/mL (ref 0.35–5.50)

## 2013-11-27 LAB — CBC WITH DIFFERENTIAL/PLATELET
BASOS ABS: 0 10*3/uL (ref 0.0–0.1)
BASOS PCT: 0.6 % (ref 0.0–3.0)
Eosinophils Absolute: 0.5 10*3/uL (ref 0.0–0.7)
Eosinophils Relative: 6 % — ABNORMAL HIGH (ref 0.0–5.0)
HEMATOCRIT: 36.3 % (ref 36.0–46.0)
HEMOGLOBIN: 12.1 g/dL (ref 12.0–15.0)
LYMPHS ABS: 2.3 10*3/uL (ref 0.7–4.0)
Lymphocytes Relative: 29.8 % (ref 12.0–46.0)
MCHC: 33.2 g/dL (ref 30.0–36.0)
MCV: 87.9 fl (ref 78.0–100.0)
MONOS PCT: 6 % (ref 3.0–12.0)
Monocytes Absolute: 0.5 10*3/uL (ref 0.1–1.0)
NEUTROS ABS: 4.4 10*3/uL (ref 1.4–7.7)
Neutrophils Relative %: 57.6 % (ref 43.0–77.0)
Platelets: 352 10*3/uL (ref 150.0–400.0)
RBC: 4.13 Mil/uL (ref 3.87–5.11)
RDW: 14.8 % — AB (ref 11.5–14.6)
WBC: 7.6 10*3/uL (ref 4.5–10.5)

## 2013-11-27 LAB — COMPREHENSIVE METABOLIC PANEL
ALT: 14 U/L (ref 0–35)
AST: 17 U/L (ref 0–37)
Albumin: 3.3 g/dL — ABNORMAL LOW (ref 3.5–5.2)
Alkaline Phosphatase: 58 U/L (ref 39–117)
BUN: 12 mg/dL (ref 6–23)
CO2: 27 meq/L (ref 19–32)
CREATININE: 0.7 mg/dL (ref 0.4–1.2)
Calcium: 9.6 mg/dL (ref 8.4–10.5)
Chloride: 101 mEq/L (ref 96–112)
GFR: 109.29 mL/min (ref >60.00)
GLUCOSE: 83 mg/dL (ref 70–99)
Potassium: 3.3 mEq/L — ABNORMAL LOW (ref 3.5–5.1)
Sodium: 138 mEq/L (ref 135–145)
Total Bilirubin: 0.6 mg/dL (ref 0.3–1.2)
Total Protein: 7.7 g/dL (ref 6.0–8.3)

## 2013-11-27 LAB — URINALYSIS, ROUTINE W REFLEX MICROSCOPIC
Bilirubin Urine: NEGATIVE
Hgb urine dipstick: NEGATIVE
Ketones, ur: NEGATIVE
Nitrite: NEGATIVE
RBC / HPF: NONE SEEN (ref 0–?)
SPECIFIC GRAVITY, URINE: 1.02 (ref 1.000–1.030)
Total Protein, Urine: NEGATIVE
UROBILINOGEN UA: 0.2 (ref 0.0–1.0)
Urine Glucose: NEGATIVE
pH: 6 (ref 5.0–8.0)

## 2013-11-27 LAB — CARDIAC PANEL
CK-MB: 2.2 ng/mL (ref 0.3–4.0)
Relative Index: 1.3 calc (ref 0.0–2.5)
Total CK: 172 U/L (ref 7–177)

## 2013-11-27 LAB — LIPID PANEL
Cholesterol: 254 mg/dL — ABNORMAL HIGH (ref 0–200)
HDL: 66 mg/dL (ref >39.00)
LDL Cholesterol: 167 mg/dL — ABNORMAL HIGH (ref 0–99)
TRIGLYCERIDES: 105 mg/dL (ref 0.0–149.0)
Total CHOL/HDL Ratio: 4
VLDL: 21 mg/dL (ref 0.0–40.0)

## 2013-11-27 LAB — TROPONIN I: Troponin I: <0.01 ng/mL (ref <0.06)

## 2013-11-27 LAB — HEMOGLOBIN A1C: Hgb A1c MFr Bld: 6 % (ref 4.6–6.5)

## 2013-11-27 MED ORDER — PREGABALIN 75 MG PO CAPS: 75.0000 mg | ORAL_CAPSULE | Freq: Two times a day (BID) | ORAL | Status: DC

## 2013-11-27 NOTE — Assessment & Plan Note (Addendum)
Her EKG shows a junctional rhythm with a more prominent RBBB and new T wave inversion Her cardiac enzymes are negative Her symptoms are vague so I have asked her to see cardiology for further evalaution

## 2013-11-27 NOTE — Progress Notes (Signed)
   Subjective:    Patient ID: Christy Harrell, female    DOB: 06-Feb-1941, 72 y.o.   MRN: [id number]  HPI Comments: She returns complaining of "extreme fatigue" and a "weak" feeling in her chest.  Thyroid Problem Presents for follow-up visit. Symptoms include constipation, depressed mood and fatigue. Patient reports no anxiety, cold intolerance, diaphoresis, diarrhea, dry skin, hair loss, heat intolerance, hoarse voice, leg swelling, nail problem, palpitations, tremors, visual change, weight gain or weight loss. The symptoms have been worsening. Past treatments include levothyroxine. Her past medical history is significant for obesity.      Review of Systems  Constitutional: Positive for fatigue. Negative for fever, chills, weight loss, weight gain, diaphoresis, activity change, appetite change and unexpected weight change.  HENT: Negative.  Negative for hoarse voice.   Eyes: Negative.   Respiratory: Negative.   Cardiovascular: Negative.  Negative for chest pain, palpitations and leg swelling.  Gastrointestinal: Positive for constipation. Negative for nausea, vomiting, abdominal pain, diarrhea, blood in stool, abdominal distention, anal bleeding and rectal pain.  Endocrine: Negative.  Negative for cold intolerance and heat intolerance.  Genitourinary: Negative.   Musculoskeletal: Positive for arthralgias and back pain. Negative for gait problem, joint swelling, myalgias, neck pain and neck stiffness.  Skin: Negative.   Allergic/Immunologic: Negative.   Neurological: Negative.  Negative for tremors.  Hematological: Negative.  Negative for adenopathy. Does not bruise/bleed easily.  Psychiatric/Behavioral: Negative.        Objective:   Physical Exam  Vitals reviewed. Constitutional: She is oriented to person, place, and time. She appears well-developed and well-nourished. No distress.  HENT:  Head: Normocephalic and atraumatic.  Mouth/Throat: Oropharynx is clear and moist. No  oropharyngeal exudate.  Eyes: Conjunctivae are normal. Right eye exhibits no discharge. Left eye exhibits no discharge. No scleral icterus.  Neck: Normal range of motion. Neck supple. No JVD present. No tracheal deviation present. No thyromegaly present.  Cardiovascular: Normal rate, regular rhythm, normal heart sounds and intact distal pulses.  Exam reveals no gallop and no friction rub.   No murmur heard. Pulmonary/Chest: Effort normal and breath sounds normal. No stridor. No respiratory distress. She has no wheezes. She has no rales. She exhibits no tenderness.  Abdominal: Soft. Bowel sounds are normal. She exhibits no distension and no mass. There is no tenderness. There is no rebound and no guarding.  Musculoskeletal: Normal range of motion. She exhibits no edema and no tenderness.  Lymphadenopathy:    She has no cervical adenopathy.  Neurological: She is oriented to person, place, and time.  Skin: Skin is warm and dry. No rash noted. She is not diaphoretic. No erythema. No pallor.  Psychiatric: She has a normal mood and affect. Her behavior is normal. Judgment and thought content normal.     Lab Results  Component Value Date   WBC 5.8 11/17/2013   HGB 11.9 11/17/2013   HCT 35.9 11/17/2013   PLT 248 11/17/2013   GLUCOSE 101 11/17/2013   CHOL 239* 03/16/2013   TRIG 105.0 03/16/2013   HDL 66.60 03/16/2013   LDLDIRECT 160.0 03/16/2013   ALT 7 11/17/2013   AST 16 11/17/2013   NA 143 11/17/2013   K 4.0 11/17/2013   CL 103 09/13/2013   CREATININE 0.8 11/17/2013   BUN 11.3 11/17/2013   CO2 28 11/17/2013   TSH 11.47* 08/21/2013   HGBA1C 6.3 08/21/2013   MICROALBUR 0.4 03/16/2013       Assessment & Plan:

## 2013-11-27 NOTE — Patient Instructions (Signed)

## 2013-11-27 NOTE — Progress Notes (Signed)
Pre visit review using our clinic review tool, if applicable. No additional management support is needed unless otherwise documented below in the visit note. 

## 2013-11-28 DIAGNOSIS — E876 Hypokalemia: Secondary | ICD-10-CM | POA: Insufficient documentation

## 2013-11-28 MED ORDER — POTASSIUM CHLORIDE CRYS ER 20 MEQ PO TBCR: 20.0000 meq | EXTENDED_RELEASE_TABLET | Freq: Every day | ORAL | Status: DC

## 2013-11-28 MED ORDER — EZETIMIBE 10 MG PO TABS: 10.0000 mg | ORAL_TABLET | Freq: Every day | ORAL | Status: DC

## 2013-11-28 NOTE — Assessment & Plan Note (Signed)
Her TSH is in the normal range She will stay on the same dose for now

## 2013-11-28 NOTE — Assessment & Plan Note (Signed)
She will cont the same meds for pain

## 2013-11-28 NOTE — Assessment & Plan Note (Signed)
I have asked her to start K+ replacement therapy

## 2013-11-28 NOTE — Assessment & Plan Note (Signed)
Her LDL is too high so I have asked her to start zetia in addition to the statin

## 2013-11-29 ENCOUNTER — Ambulatory Visit (HOSPITAL_COMMUNITY)
Admission: RE | Admit: 2013-11-29 | Discharge: 2013-11-29 | Disposition: A | Discharge status: Home / Self Care | Payer: Medicare Other | Source: Ambulatory Visit | Attending: Oncology | Admitting: Oncology

## 2013-11-29 DIAGNOSIS — Z853 Personal history of malignant neoplasm of breast: Secondary | ICD-10-CM | POA: Insufficient documentation

## 2013-11-29 DIAGNOSIS — C7951 Secondary malignant neoplasm of bone: Secondary | ICD-10-CM

## 2013-11-29 DIAGNOSIS — K7689 Other specified diseases of liver: Secondary | ICD-10-CM | POA: Insufficient documentation

## 2013-11-29 DIAGNOSIS — M810 Age-related osteoporosis without current pathological fracture: Secondary | ICD-10-CM

## 2013-11-29 DIAGNOSIS — I700 Atherosclerosis of aorta: Secondary | ICD-10-CM | POA: Insufficient documentation

## 2013-11-29 DIAGNOSIS — Z9010 Acquired absence of unspecified breast and nipple: Secondary | ICD-10-CM | POA: Insufficient documentation

## 2013-11-29 DIAGNOSIS — J479 Bronchiectasis, uncomplicated: Secondary | ICD-10-CM | POA: Insufficient documentation

## 2013-11-29 DIAGNOSIS — N63 Unspecified lump in unspecified breast: Secondary | ICD-10-CM | POA: Insufficient documentation

## 2013-11-29 DIAGNOSIS — Z9221 Personal history of antineoplastic chemotherapy: Secondary | ICD-10-CM | POA: Insufficient documentation

## 2013-11-29 DIAGNOSIS — R0602 Shortness of breath: Secondary | ICD-10-CM | POA: Insufficient documentation

## 2013-11-29 DIAGNOSIS — I2510 Atherosclerotic heart disease of native coronary artery without angina pectoris: Secondary | ICD-10-CM | POA: Insufficient documentation

## 2013-11-29 DIAGNOSIS — I7090 Unspecified atherosclerosis: Secondary | ICD-10-CM | POA: Insufficient documentation

## 2013-11-29 DIAGNOSIS — Z923 Personal history of irradiation: Secondary | ICD-10-CM | POA: Insufficient documentation

## 2013-11-29 DIAGNOSIS — C50512 Malignant neoplasm of lower-outer quadrant of left female breast: Secondary | ICD-10-CM

## 2013-11-29 MED ORDER — IOHEXOL 300 MG/ML  SOLN
80.0000 mL | Freq: Once | INTRAMUSCULAR | Status: AC | PRN
  Administered 2013-11-29: 80 mL via INTRAVENOUS

## 2013-12-02 ENCOUNTER — Other Ambulatory Visit: Payer: Self-pay | Admitting: Oncology

## 2013-12-02 DIAGNOSIS — I700 Atherosclerosis of aorta: Secondary | ICD-10-CM

## 2013-12-02 NOTE — Progress Notes (Unsigned)
I called Christy Harrell with the results of her CT scan, we show a couple of liver lesions likely to be fat infiltration, but possibly representing tumor. We are getting her an MRI of the liver for better characterization. The scan also showed some thickening of her right breast area. This of course has had radiation and likely what we're seeing is the normal post radiation change. Nevertheless we will again examined the patient's right breast at her visit May 4, when she comes for her next treatment.  Christy Harrell has a good understanding of this plan and agrees with this. She will call with any problems before the next visit.

## 2013-12-06 ENCOUNTER — Ambulatory Visit (HOSPITAL_COMMUNITY)
Admission: RE | Admit: 2013-12-06 | Discharge: 2013-12-06 | Disposition: A | Discharge status: Home / Self Care | Payer: Medicare Other | Source: Ambulatory Visit | Attending: Oncology | Admitting: Oncology

## 2013-12-06 ENCOUNTER — Other Ambulatory Visit: Payer: Self-pay | Admitting: Oncology

## 2013-12-06 ENCOUNTER — Other Ambulatory Visit: Payer: Self-pay

## 2013-12-06 ENCOUNTER — Emergency Department (HOSPITAL_COMMUNITY)
Admission: EM | Admit: 2013-12-06 | Discharge: 2013-12-06 | Disposition: A | Discharge status: Home / Self Care | Payer: Medicare Other | Attending: Emergency Medicine | Admitting: Emergency Medicine

## 2013-12-06 ENCOUNTER — Encounter (HOSPITAL_COMMUNITY): Payer: Self-pay | Admitting: Emergency Medicine

## 2013-12-06 DIAGNOSIS — G709 Myoneural disorder, unspecified: Secondary | ICD-10-CM | POA: Insufficient documentation

## 2013-12-06 DIAGNOSIS — Z79899 Other long term (current) drug therapy: Secondary | ICD-10-CM | POA: Insufficient documentation

## 2013-12-06 DIAGNOSIS — C787 Secondary malignant neoplasm of liver and intrahepatic bile duct: Secondary | ICD-10-CM | POA: Insufficient documentation

## 2013-12-06 DIAGNOSIS — Z9889 Other specified postprocedural states: Secondary | ICD-10-CM | POA: Insufficient documentation

## 2013-12-06 DIAGNOSIS — Z87891 Personal history of nicotine dependence: Secondary | ICD-10-CM | POA: Insufficient documentation

## 2013-12-06 DIAGNOSIS — Z853 Personal history of malignant neoplasm of breast: Secondary | ICD-10-CM | POA: Insufficient documentation

## 2013-12-06 DIAGNOSIS — Z791 Long term (current) use of non-steroidal anti-inflammatories (NSAID): Secondary | ICD-10-CM | POA: Insufficient documentation

## 2013-12-06 DIAGNOSIS — Z923 Personal history of irradiation: Secondary | ICD-10-CM | POA: Insufficient documentation

## 2013-12-06 DIAGNOSIS — I10 Essential (primary) hypertension: Secondary | ICD-10-CM | POA: Insufficient documentation

## 2013-12-06 DIAGNOSIS — Z8719 Personal history of other diseases of the digestive system: Secondary | ICD-10-CM | POA: Insufficient documentation

## 2013-12-06 DIAGNOSIS — I700 Atherosclerosis of aorta: Secondary | ICD-10-CM

## 2013-12-06 DIAGNOSIS — Z85830 Personal history of malignant neoplasm of bone: Secondary | ICD-10-CM | POA: Insufficient documentation

## 2013-12-06 DIAGNOSIS — E119 Type 2 diabetes mellitus without complications: Secondary | ICD-10-CM | POA: Insufficient documentation

## 2013-12-06 DIAGNOSIS — R609 Edema, unspecified: Secondary | ICD-10-CM | POA: Insufficient documentation

## 2013-12-06 DIAGNOSIS — T50995A Adverse effect of other drugs, medicaments and biological substances, initial encounter: Secondary | ICD-10-CM | POA: Insufficient documentation

## 2013-12-06 DIAGNOSIS — R0609 Other forms of dyspnea: Secondary | ICD-10-CM | POA: Insufficient documentation

## 2013-12-06 DIAGNOSIS — Z789 Other specified health status: Secondary | ICD-10-CM | POA: Insufficient documentation

## 2013-12-06 DIAGNOSIS — M25519 Pain in unspecified shoulder: Secondary | ICD-10-CM | POA: Insufficient documentation

## 2013-12-06 DIAGNOSIS — T508X5A Adverse effect of diagnostic agents, initial encounter: Secondary | ICD-10-CM

## 2013-12-06 DIAGNOSIS — R0989 Other specified symptoms and signs involving the circulatory and respiratory systems: Principal | ICD-10-CM | POA: Insufficient documentation

## 2013-12-06 DIAGNOSIS — M129 Arthropathy, unspecified: Secondary | ICD-10-CM | POA: Insufficient documentation

## 2013-12-06 DIAGNOSIS — C50919 Malignant neoplasm of unspecified site of unspecified female breast: Secondary | ICD-10-CM | POA: Insufficient documentation

## 2013-12-06 DIAGNOSIS — E785 Hyperlipidemia, unspecified: Secondary | ICD-10-CM | POA: Insufficient documentation

## 2013-12-06 MED ORDER — GADOBENATE DIMEGLUMINE 529 MG/ML IV SOLN
19.0000 mL | Freq: Once | INTRAVENOUS | Status: AC | PRN
  Administered 2013-12-06: 19 mL via INTRAVENOUS

## 2013-12-06 NOTE — Discharge Instructions (Signed)
Please return if any shortness of breath, weakness, rash, or swelling of mouth or throat.  Tell future providers that you may have had a reaction to contrast.

## 2013-12-06 NOTE — ED Notes (Signed)
Pt states had to have an MRI today of liver per oncologist, pt states it was hard having to hold her breath when they told her, then when they gave her the contrast she felt funny and didn't like the way it tasted, pt states has also had some L sided chest cramping for past 5 days, pt states worse when moving. Pt 100% on RA, in no distress.

## 2013-12-06 NOTE — ED Notes (Signed)
Bed: VZ48 Expected date:  Expected time:  Means of arrival:  Comments: From MRI--rxn to contrast

## 2013-12-06 NOTE — ED Notes (Signed)
Pt sent here from MRI, went for a scan of her liver today, contrast given through her IV and when it was given she told them to stop the test, complaining that she couldn't catch her breath and she was feeling funny, stated to them that her heart was feeling funny, pt was placed on 2L  by MRI.

## 2013-12-06 NOTE — ED Provider Notes (Signed)
CSN: [id number]     Arrival date & time 12/06/13  1226 History   First MD Initiated Contact with Patient 12/06/13 1434     Chief Complaint  Patient presents with  . Contrast reaction      (Consider location/radiation/quality/duration/timing/severity/associated sxs/prior Treatment) HPI 73 year old female with a history of breast cancer who is having an MRI of her liver done today. When she was given the contrast she states that she felt very warm and flushed. She states her son her to hold her breath and she felt like she couldn't hold her breath. She also states that her left shoulder which has had surgery it was painful in the MRI. She states that she told them that she couldn't hold her breath and was sent here for further evaluation. She denies any rash, mouth, throat swelling, dyspnea, vomiting, or diarrhea. She denies having a previous similar episode. She has been feeling improved since she has been in the emergency department and does not think she was given any medication. Past Medical History  Diagnosis Date  . Cancer     right breast  . Club foot   . Arthritis   . Thyroid disease   . Hyperlipidemia   . Diabetes mellitus   . Osteoporosis   . Hypertension   . GERD (gastroesophageal reflux disease)     watches diet  . Neuromuscular disorder     lt arm numb sometimes  . Wears glasses   . 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 History of radiation therapy 02/22/13- 03/13/13    left proximal humerus 3500 cGy 14 sessions  . Lymphedema     right arm   Past Surgical History  Procedure Laterality Date  . Cesarean section    . Thyroidectomy  2002  . Foot surgery      as a child for club foot  . Breast surgery  2012    rt lump-16 nodes-in [place]  . Cesarean section    . Colonoscopy    . Dilation and curettage of uterus    . Humerus im nail Left 12/27/2012    Procedure: INTRAMEDULLARY (IM) NAIL HUMERAL LEFT PATHOLOGIC FRACTURE;  Surgeon: Nita Sells, MD;  Location: [facility];  Service: Orthopedics;  Laterality: Left;   Family History  Problem Relation Age of Onset  . Cancer Neg Hx   . Arthritis Neg Hx   . Heart disease Neg Hx   . Hyperlipidemia Neg Hx   . Hypertension Neg Hx   . Dementia Mother   . Hearing loss Mother    History  Substance Use Topics  . Smoking status: Former Smoker    Quit date: 12/23/1974  . Smokeless tobacco: Never Used  . Alcohol Use: No   OB History   Grav Para Term Preterm Abortions TAB SAB Ect Mult Living                 Review of Systems  All other systems reviewed and are negative.     Allergies  Gadolinium derivatives  Home Medications   Prior to Admission medications   Medication Sig Start Date End Date Taking? Authorizing Provider  diclofenac sodium (VOLTAREN) 1 % GEL Apply 2 g topically 4 (four) times daily. 05/31/12  Yes Aundria Mems, PA-C  Dietary Management Product (VASCULERA) TABS Take 1 capsule by mouth daily. 03/22/13  Yes Janith Lima, MD  docusate sodium (COLACE) 100 MG capsule Take 100 mg by  mouth 2 (two) times daily as needed for constipation.   Yes Historical Provider, MD  ezetimibe (ZETIA) 10 MG tablet Take 1 tablet (10 mg total) by mouth daily. 11/28/13  Yes Janith Lima, MD  fentaNYL (DURAGESIC - DOSED MCG/HR) 25 MCG/HR patch Place 1 patch (25 mcg total) onto the skin every 3 (three) days. Fill on or after 10/19/13 08/21/13  Yes Janith Lima, MD  furosemide (LASIX) 40 MG tablet Take 40 mg by mouth daily.   Yes Historical Provider, MD  hyaluronate sodium (RADIAPLEXRX) GEL Apply 1 application topically 2 (two) times daily. Apply to affected area after rad txs and bedtime 03/08/13  Yes Rexene Edison, MD  levothyroxine (SYNTHROID) 175 MCG tablet Take 1 tablet (175 mcg total) by mouth daily before breakfast. 08/21/13  Yes Janith Lima, MD  lubiprostone (AMITIZA) 24 MCG capsule Take 1 capsule (24 mcg total) by mouth 2 (two) times daily with a meal.  08/31/13  Yes Janith Lima, MD  metFORMIN (GLUCOPHAGE) 500 MG tablet Take 1 tablet (500 mg total) by mouth daily. 11/22/13  Yes Janith Lima, MD  oxyCODONE (ROXICODONE) 5 MG immediate release tablet 1-2 every 4-6 hours prn pain fill on or after 11/22/13 11/22/13  Yes Janith Lima, MD  Polyethyl Glycol-Propyl Glycol (SYSTANE ULTRA OP) Place 1 drop into both eyes daily.   Yes Historical Provider, MD  potassium chloride SA (K-DUR,KLOR-CON) 20 MEQ tablet Take 1 tablet (20 mEq total) by mouth daily. 11/28/13  Yes Janith Lima, MD  pregabalin (LYRICA) 75 MG capsule Take 1 capsule (75 mg total) by mouth 2 (two) times daily. Not taking 11/27/13  Yes Janith Lima, MD  zolendronic acid (ZOMETA) 4 MG/5ML injection Inject 4 mg into the vein every 30 (thirty) days.   Yes Historical Provider, MD   BP 107/69  Pulse 78  Temp(Src) 97.9 F (36.6 C) (Oral)  Resp 22  SpO2 100% Physical Exam  Nursing note and vitals reviewed. Constitutional: She is oriented to person, place, and time. She appears well-developed and well-nourished.  Morbidly obese  HENT:  Head: Normocephalic and atraumatic.  Eyes: Pupils are equal, round, and reactive to light.  Neck: Normal range of motion. Neck supple.  Cardiovascular: Normal rate and regular rhythm.   Pulmonary/Chest: Effort normal and breath sounds normal.  Abdominal: Soft. Bowel sounds are normal.  Musculoskeletal:  Right upper extremity edema with leave in place  Neurological: She is alert and oriented to person, place, and time. She has normal reflexes.  Skin: Skin is warm and dry. No rash noted. No erythema. No pallor.  Psychiatric: She has a normal mood and affect.    ED Course  Procedures (including critical care time) Labs Review Labs Reviewed - No data to display  Imaging Review Mr Abdomen W Wo Contrast  12/06/2013   CLINICAL DATA:  History of breast cancer. Possible liver metastases.  EXAM: MRI ABDOMEN WITHOUT AND WITH CONTRAST  TECHNIQUE:  Multiplanar multisequence MR imaging of the abdomen was performed both before and after the administration of intravenous contrast.  CONTRAST:  18mL MULTIHANCE GADOBENATE DIMEGLUMINE 529 MG/ML IV SOLN  COMPARISON:  Chest CT from 10/16/2013.  PET-CT from 01/13/2013.  FINDINGS: The patient was unable to complete the exam secondary to Chest numbness and tingling. The study was stopped shortly after administration of the intravenous contrast material.  Imaging of the lower chest shows a 3.9 cm homogeneous T2 hyperintense collection in the medial right breast, likely a seroma or  hematoma.  5.6 x 5.7 cm heterogeneously enhancing mass with central necrosis is identified in the lateral segment of the left liver. A similar 3.0 x 3.5 cm heterogeneously enhancing mass in the central right liver has several smaller adjacent enhancing nodules measuring in the 5-15 mm size range. Additional 8 mm enhancing nodule is seen in the anterior right hepatic dome.  Spleen is unremarkable. The stomach, duodenum, pancreas, gallbladder, and adrenal glands are unremarkable. No evidence for enhancing mass lesion in either kidney.  No abdominal aortic aneurysm.  No lymphadenopathy in the abdomen  No abnormal marrow enhancement identified within the visualized bony structures.  IMPRESSION: Multiple liver lesions consistent with metastases. The largest is in the lateral segment of the left liver measuring 5.7 cm. Another dominant 3.5 cm lesion is identified in the central right liver.   Electronically Signed   By: Misty Stanley M.D.   On: 12/06/2013 14:29     EKG Interpretation   Date/Time:  Wednesday December 06 2013 12:34:41 EDT Ventricular Rate:  68 PR Interval:  123 QRS Duration: 129 QT Interval:  379 QTC Calculation: 403 R Axis:   -48 Text Interpretation:  Sinus rhythm RBBB and LAFB Confirmed by Seraphina Mitchner MD,  Morrigan Wickens ([id number]) on 12/06/2013 3:33:24 PM     Filed Vitals:   12/06/13 1446  BP: 107/69  Pulse:   Temp:   Resp: 22     MDM   Final diagnoses:  Reaction to contrast media       Shaune Pollack, MD 12/06/13 1536

## 2013-12-11 ENCOUNTER — Ambulatory Visit (HOSPITAL_BASED_OUTPATIENT_CLINIC_OR_DEPARTMENT_OTHER): Payer: Medicare Other

## 2013-12-11 ENCOUNTER — Other Ambulatory Visit (HOSPITAL_BASED_OUTPATIENT_CLINIC_OR_DEPARTMENT_OTHER): Payer: Medicare Other

## 2013-12-11 VITALS — BP 110/68 | HR 75 | Temp 98.9°F | Resp 20

## 2013-12-11 DIAGNOSIS — M899 Disorder of bone, unspecified: Secondary | ICD-10-CM

## 2013-12-11 DIAGNOSIS — C50919 Malignant neoplasm of unspecified site of unspecified female breast: Secondary | ICD-10-CM

## 2013-12-11 DIAGNOSIS — C50519 Malignant neoplasm of lower-outer quadrant of unspecified female breast: Secondary | ICD-10-CM

## 2013-12-11 DIAGNOSIS — C7952 Secondary malignant neoplasm of bone marrow: Secondary | ICD-10-CM

## 2013-12-11 DIAGNOSIS — C7951 Secondary malignant neoplasm of bone: Secondary | ICD-10-CM

## 2013-12-11 DIAGNOSIS — Z5111 Encounter for antineoplastic chemotherapy: Secondary | ICD-10-CM

## 2013-12-11 LAB — COMPREHENSIVE METABOLIC PANEL (CC13)
ALBUMIN: 3.2 g/dL — AB (ref 3.5–5.0)
ALT: 7 U/L (ref 0–55)
AST: 14 U/L (ref 5–34)
Alkaline Phosphatase: 64 U/L (ref 40–150)
Anion Gap: 8 mEq/L (ref 3–11)
BUN: 10.1 mg/dL (ref 7.0–26.0)
CO2: 25 mEq/L (ref 22–29)
Calcium: 9.7 mg/dL (ref 8.4–10.4)
Chloride: 109 mEq/L (ref 98–109)
Creatinine: 0.8 mg/dL (ref 0.6–1.1)
GLUCOSE: 94 mg/dL (ref 70–140)
POTASSIUM: 4 meq/L (ref 3.5–5.1)
Sodium: 142 mEq/L (ref 136–145)
Total Bilirubin: 0.33 mg/dL (ref 0.20–1.20)
Total Protein: 7.2 g/dL (ref 6.4–8.3)

## 2013-12-11 LAB — CBC WITH DIFFERENTIAL/PLATELET
BASO%: 1.1 % (ref 0.0–2.0)
BASOS ABS: 0.1 10*3/uL (ref 0.0–0.1)
EOS%: 3.6 % (ref 0.0–7.0)
Eosinophils Absolute: 0.2 10*3/uL (ref 0.0–0.5)
HCT: 36.2 % (ref 34.8–46.6)
HGB: 12 g/dL (ref 11.6–15.9)
LYMPH%: 33.2 % (ref 14.0–49.7)
MCH: 29.1 pg (ref 25.1–34.0)
MCHC: 33.1 g/dL (ref 31.5–36.0)
MCV: 87.9 fL (ref 79.5–101.0)
MONO#: 0.6 10*3/uL (ref 0.1–0.9)
MONO%: 9.2 % (ref 0.0–14.0)
NEUT#: 3.3 10*3/uL (ref 1.5–6.5)
NEUT%: 52.9 % (ref 38.4–76.8)
NRBC: 0 % (ref 0–0)
PLATELETS: 266 10*3/uL (ref 145–400)
RBC: 4.12 10*6/uL (ref 3.70–5.45)
RDW: 14.6 % — AB (ref 11.2–14.5)
WBC: 6.3 10*3/uL (ref 3.9–10.3)
lymph#: 2.1 10*3/uL (ref 0.9–3.3)

## 2013-12-11 MED ORDER — FULVESTRANT 250 MG/5ML IM SOLN
500.0000 mg | Freq: Once | INTRAMUSCULAR | Status: AC
  Administered 2013-12-11: 500 mg via INTRAMUSCULAR
  Filled 2013-12-11: qty 10

## 2013-12-11 MED ORDER — ZOLEDRONIC ACID 4 MG/100ML IV SOLN
4.0000 mg | Freq: Once | INTRAVENOUS | Status: AC
  Administered 2013-12-11: 4 mg via INTRAVENOUS
  Filled 2013-12-11: qty 100

## 2013-12-11 MED ORDER — SODIUM CHLORIDE 0.9 % IV SOLN
Freq: Once | INTRAVENOUS | Status: AC
  Administered 2013-12-11: 12:00:00 via INTRAVENOUS

## 2013-12-11 NOTE — Patient Instructions (Signed)

## 2013-12-12 ENCOUNTER — Telehealth: Payer: Self-pay | Admitting: Internal Medicine

## 2013-12-12 NOTE — Telephone Encounter (Signed)
Called Pharm...obtained pt ID for [facility]. PA request for Lyrica. [place] approved. Called Pharm and informed. Called Pt and informed.

## 2013-12-15 ENCOUNTER — Ambulatory Visit: Payer: Medicare Other

## 2013-12-15 ENCOUNTER — Other Ambulatory Visit: Payer: Medicare Other

## 2013-12-20 ENCOUNTER — Telehealth: Payer: Self-pay | Admitting: Dietician

## 2013-12-20 NOTE — Telephone Encounter (Signed)
Brief Outpatient Oncology Nutrition Note  Patient has been identified to be at risk on malnutrition screen.  Wt Readings from Last 10 Encounters:  11/27/13 209 lb 4 oz (94.915 kg)  11/17/13 214 lb 11.2 oz (97.387 kg)  08/21/13 216 lb (97.977 kg)  06/15/13 214 lb (97.07 kg)  05/11/13 216 lb (97.977 kg)  05/09/13 215 lb 6.4 oz (97.705 kg)  05/02/13 215 lb 11.2 oz (97.841 kg)  03/22/13 215 lb (97.523 kg)  03/20/13 217 lb (98.431 kg)  03/16/13 218 lb (98.884 kg)      Dx:  Breast cancer with mets to bone and probable liver.  Patient of Dr. [name].  Called patient due to weight loss.  Patient is caring for mother with Alzheimer's.  Eating lighter per diet hx and usually does not eat lunch.  Patient is not aware of newest MRI results.  Encouraged her to start eating lunch again and discussed balanced meals.  Patient follows a diabetic diet.  Collins RD available as needed.  Antonieta Iba, RD, LDN

## 2013-12-25 ENCOUNTER — Telehealth: Payer: Self-pay | Admitting: Oncology

## 2013-12-25 ENCOUNTER — Encounter: Payer: Self-pay | Admitting: Cardiology

## 2013-12-25 ENCOUNTER — Ambulatory Visit (INDEPENDENT_AMBULATORY_CARE_PROVIDER_SITE_OTHER): Payer: Medicare Other | Admitting: Cardiology

## 2013-12-25 VITALS — BP 126/60 | HR 64 | Ht 60.0 in | Wt 208.0 lb

## 2013-12-25 DIAGNOSIS — R079 Chest pain, unspecified: Secondary | ICD-10-CM

## 2013-12-25 DIAGNOSIS — I2510 Atherosclerotic heart disease of native coronary artery without angina pectoris: Secondary | ICD-10-CM

## 2013-12-25 DIAGNOSIS — I452 Bifascicular block: Secondary | ICD-10-CM

## 2013-12-25 NOTE — Telephone Encounter (Signed)
, °

## 2013-12-25 NOTE — Progress Notes (Signed)
[facility], [place], [place]  [address] Phone: [phone] Fax:  [phone]  Date:  12/25/2013   ID:  Christy Harrell, DOB 01/18/1941, MRN [id number]  PCP:  [name], MD  Cardiologist:  Fransico Him, MD     History of Present Illness: Christy Harrell is a 73 y.o. female with a history of HTN, DM and dyslipidemia who presents today for evaluation of recent abnormal EKG.  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  The RBBB has been present since 2013.  She was seen by her PCP on 11/27/13 and an EKG showed ectopic atrial rhythm which had resolved by EKG done on 12/06/13.  She has been having a sensation of fatigue in her chest and has some discomfort that she had a difficulty describing.  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  She has surgery last year on the shoulder and has chronic shoulder pain on the left along with left arm pain.  She gets tired easily with exertion.  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  She has been under a lot of stress with her daughter who had brain surgery and her grandson has been ill as well.  She says that she gets SOB with minimal exertion and over her left breast that occurs with exertion and she will sit down and rest and then it resolves.  She also has been belching.  She recently had a Chest CT for her breast CA that showed coronary artery calcification in the LAD and all 3 vessels.   Wt Readings from Last 3 Encounters:  11/27/13 209 lb 4 oz (94.915 kg)  11/17/13 214 lb 11.2 oz (97.387 kg)  08/21/13 216 lb (97.977 kg)     Past Medical History  Diagnosis Date  . Cancer     right breast  . Club foot   . Arthritis   . Thyroid disease   . Hyperlipidemia   . Diabetes mellitus   . Osteoporosis   . Hypertension   . GERD (gastroesophageal reflux disease)     watches diet  . Neuromuscular disorder     lt arm numb sometimes  . Wears glasses   . Metastasis  from malignant tumor of breast     left humerous  . History of radiation therapy 02/22/13- 03/13/13    left proximal humerus 3500 cGy 14 sessions  . Lymphedema     right arm    Current Outpatient Prescriptions  Medication Sig Dispense Refill  . diclofenac sodium (VOLTAREN) 1 % GEL Apply 2 g topically 4 (four) times daily.  2 Tube  2  . Dietary Management Product (VASCULERA) TABS Take 1 capsule by mouth daily.  30 tablet  11  . docusate sodium (COLACE) 100 MG capsule Take 100 mg by mouth 2 (two) times daily as needed for constipation.      Marland Kitchen ezetimibe (ZETIA) 10 MG tablet Take 1 tablet (10 mg total) by mouth daily.  90 tablet  3  . fentaNYL (DURAGESIC - DOSED MCG/HR) 25 MCG/HR patch Place 1 patch (25 mcg total) onto the skin every 3 (three) days. Fill on or after 10/19/13  10 patch  0  . furosemide (LASIX) 40 MG tablet Take 40 mg by mouth daily.      . hyaluronate sodium (RADIAPLEXRX) GEL Apply 1 application topically 2 (two) times daily. Apply to affected area after rad txs  and bedtime      . levothyroxine (SYNTHROID) 175 MCG tablet Take 1 tablet (175 mcg total) by mouth daily before breakfast.  90 tablet  1  . lubiprostone (AMITIZA) 24 MCG capsule Take 1 capsule (24 mcg total) by mouth 2 (two) times daily with a meal.  180 capsule  1  . metFORMIN (GLUCOPHAGE) 500 MG tablet Take 1 tablet (500 mg total) by mouth daily.  90 tablet  3  . oxyCODONE (ROXICODONE) 5 MG immediate release tablet 1-2 every 4-6 hours prn pain fill on or after 11/22/13  65 tablet  0  . Polyethyl Glycol-Propyl Glycol (SYSTANE ULTRA OP) Place 1 drop into both eyes daily.      . potassium chloride SA (K-DUR,KLOR-CON) 20 MEQ tablet Take 1 tablet (20 mEq total) by mouth daily.  90 tablet  1  . pregabalin (LYRICA) 75 MG capsule Take 1 capsule (75 mg total) by mouth 2 (two) times daily. Not taking  60 capsule  5  . zolendronic acid (ZOMETA) 4 MG/5ML injection Inject 4 mg into the vein every 30 (thirty) days.       No current  facility-administered medications for this visit.    Allergies:    Allergies  Allergen Reactions  . Gadolinium Derivatives Other (See Comments)    Pt began having chest numbness/tingling , stated her heart felt funny, had to take her to ED for EKG per RN   CAP    Social History:  The patient  reports that she quit smoking about 39 years ago. She has never used smokeless tobacco. She reports that she does not drink alcohol or use illicit drugs.   Family History:  The patient's family history includes Dementia in her mother; Hearing loss in her mother. There is no history of Cancer, Arthritis, Heart disease, Hyperlipidemia, or Hypertension.   ROS:  Please see the history of present illness.      All other systems reviewed and negative.   PHYSICAL EXAM: VS:  There were no vitals taken for this visit. Well nourished, well developed, in no acute distress HEENT: normal Neck: no JVD Cardiac:  normal S1, S2; RRR; no murmur Lungs:  clear to auscultation bilaterally, no wheezing, rhonchi or rales Abd: soft, nontender, no hepatomegaly Ext: no edema Skin: warm and dry Neuro:  CNs 2-12 intact, no focal abnormalities noted  EKG:  NSR with RBBB and LAFB  ASSESSMENT AND PLAN:  1. RBBB with LAFB - noted since EKG 2013. She had a recent chest CT showing atherosclerosis of the left main and 3 vessel CAD. 2. Coronary artery calcifications by chest CT 3. Chest discomfort that is vague by description and hard to determine whether it is from her breast cancer with mets to her left should with subsequent extensive shoulder surgery or if chest discomfort is due to coronary ischemia - I will get a 2 day Lexiscan myoview to evaluate  Followup with me 1 week after stress test  Signed, Fransico Him, MD 12/25/2013 11:35 AM

## 2013-12-25 NOTE — Patient Instructions (Signed)
Your physician recommends that you continue on your current medications as directed. Please refer to the Current Medication list given to you today.  Your physician has requested that you have a lexiscan myoview. For further information please visit [email or web address]. Please follow instruction sheet, as given.  Your physician recommends that you schedule a follow-up appointment one week after study is complete with Dr Radford Pax

## 2013-12-27 ENCOUNTER — Other Ambulatory Visit: Payer: Medicare Other

## 2013-12-27 ENCOUNTER — Ambulatory Visit: Payer: Medicare Other | Admitting: Oncology

## 2013-12-28 ENCOUNTER — Ambulatory Visit (HOSPITAL_BASED_OUTPATIENT_CLINIC_OR_DEPARTMENT_OTHER): Payer: Medicare Other | Admitting: Oncology

## 2013-12-28 ENCOUNTER — Other Ambulatory Visit: Payer: Medicare Other

## 2013-12-28 ENCOUNTER — Telehealth: Payer: Self-pay | Admitting: Oncology

## 2013-12-28 ENCOUNTER — Other Ambulatory Visit: Payer: Self-pay | Admitting: *Deleted

## 2013-12-28 VITALS — BP 148/87 | HR 69 | Temp 99.0°F | Resp 18 | Ht 60.0 in | Wt 209.5 lb

## 2013-12-28 DIAGNOSIS — C50519 Malignant neoplasm of lower-outer quadrant of unspecified female breast: Secondary | ICD-10-CM

## 2013-12-28 DIAGNOSIS — C787 Secondary malignant neoplasm of liver and intrahepatic bile duct: Secondary | ICD-10-CM

## 2013-12-28 DIAGNOSIS — M84429A Pathological fracture, unspecified humerus, initial encounter for fracture: Secondary | ICD-10-CM

## 2013-12-28 DIAGNOSIS — I700 Atherosclerosis of aorta: Secondary | ICD-10-CM

## 2013-12-28 DIAGNOSIS — C7951 Secondary malignant neoplasm of bone: Secondary | ICD-10-CM

## 2013-12-28 DIAGNOSIS — Z170 Estrogen receptor positive status [ER+]: Secondary | ICD-10-CM

## 2013-12-28 DIAGNOSIS — C7952 Secondary malignant neoplasm of bone marrow: Secondary | ICD-10-CM

## 2013-12-28 DIAGNOSIS — I890 Lymphedema, not elsewhere classified: Secondary | ICD-10-CM

## 2013-12-28 DIAGNOSIS — C50512 Malignant neoplasm of lower-outer quadrant of left female breast: Secondary | ICD-10-CM

## 2013-12-28 DIAGNOSIS — I2510 Atherosclerotic heart disease of native coronary artery without angina pectoris: Secondary | ICD-10-CM

## 2013-12-28 MED ORDER — LETROZOLE 2.5 MG PO TABS: 2.5000 mg | ORAL_TABLET | Freq: Every day | ORAL | Status: DC

## 2013-12-28 NOTE — Progress Notes (Signed)
ID: Christy Harrell   DOB: August 27, 1940  MR#: [id number]  XKG#:[id number]  PCP: [name], MD GYN:  SU:  OTHER MD:  Alysia Penna, Frederik Pear, Mickel Duhamel  CC: Breast cancer stage IV, follow up TREATMENT: on anti estrogen and bisphosphonate therapy  BREAST CANCER HISTORY: The patient had routine screening mammography 10/16/2010 showing a lobulated mass in the right subareolar region measuring up to 5.5 cm. The Left breast showed some central microcalcifications. Biopsy of the Right breast mass on 10/28/2010 showed an invasive ductal carcinoma, grade 2, estrogen receptor positive (Allred score 8), progesterone receptor positive (Allred score 5), with an equivocal HER-2. The Left breast area of microcalcifications was biopsied at the same time, and was read as suspicious for DCIS.  On 11/17/2010 the patient underwent Right lumpectomy and axillary lymph node dissection for what proved to be a 3 cm invasive ductal carcinoma, grade 2, with some papillary and mucinous features. One of 16 lymph nodes was involved. FISH showed no HER-2 amplification. Left breast biopsy was benign.  The patient had an Oncotype sent, with a score of 27, predicting a risk of distant recurrence after 5 years of tamoxifen in the 18% range. With this intermediate result, the decision was made not to proceed with chemotherapy, since the patient is the sole caregiver to her very elderly mother. Instead the patient proceeded to radiation treatment which was completed 03/06/2011. She started anastrozole at that point. Her subsequent history is as detailed below   INTERVAL HISTORY: [name] returns today for followup of her metastatic breast carcinoma. 2 we Recent events: She developed some shortness of breath so we obtained a CT scan of the chest 11/29/2013. He lung changes were nonspecific, but liver lesions were suggested and on 12/06/2013 liver MRI was obtained which confirms multiple liver masses,  the largest 5.7 cm, a second 13.5 cm and the others in the 5-15 mm range. This alone he see me until now because she hadwas not able to see me until now because she had some "family business" to take care of, MR which is her mother's upcoming 100 today. The patient is here now though to discuss how to deal with her newly found evidence of breast cancer spread to the liver.     REVIEW OF SYSTEMS:  process alone he has multiple chronic complaints, nausea, vomiting, altered taste and loss of appetite are not among them. She does is chronically fatigued. She has been experiencing chest pain and she tells me she has been scheduled for "a double stress test" in the next few days. She continues to be short of breath, more so than say year ago. She has a little bit of a dry cough. She denies seasonal allergies. She has with the usual arthritis pains", but these are not more intense or persistent than before. Hot flashes are moderate. A detailed review of systems is otherwise stable.   PAST MEDICAL HISTORY: Past Medical History  Diagnosis Date  . Cancer     right breast  . Club foot   . Arthritis   . Thyroid disease   . Hyperlipidemia   . Diabetes mellitus   . Osteoporosis   . Hypertension   . GERD (gastroesophageal reflux disease)     watches diet  . Neuromuscular disorder     lt arm numb sometimes  . Wears glasses   . 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 History of radiation therapy 02/22/13- 03/13/13  left proximal humerus 3500 cGy 14 sessions  . Lymphedema     right arm    PAST SURGICAL HISTORY: Past Surgical History  Procedure Laterality Date  . Cesarean section    . Thyroidectomy  2002  . Foot surgery      as a child for club foot  . Breast surgery  2012    rt lump-16 nodes-in [place]  . Cesarean section    . Colonoscopy    . Dilation and curettage of uterus    . Humerus im nail Left 12/27/2012    Procedure: INTRAMEDULLARY (IM) NAIL HUMERAL LEFT PATHOLOGIC  FRACTURE;  Surgeon: Nita Sells, MD;  Location: [facility];  Service: Orthopedics;  Laterality: Left;    FAMILY HISTORY Family History  Problem Relation Age of Onset  . Cancer Neg Hx   . Arthritis Neg Hx   . Heart disease Neg Hx   . Hyperlipidemia Neg Hx   . Hypertension Neg Hx   . Dementia Mother   . Hearing loss Mother    The patient's father died from unknown causes. The patient's mother is alive at age 43. The patient was a single child. There is no history of breast or ovarian cancer in the family to her knowledge.  GYNECOLOGIC HISTORY: Menarche age 78, menopause in her early 89s. The patient is GX P1, first pregnancy to term age 87. She did not take hormone replacement  SOCIAL HISTORY: The patient grew up in [place], attended [employer], then moved to [place] where she lived about 40 years. 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 Her mother, [name], 99, lives with the patient about participates in progress for the elderly which give this alone you a little bit of time to herself. This doesn't mean that she cannot have early morning appointments.. The patient's daughter [name], 72, is disabled secondary to pulmonary hypertension. She can be reached at [phone]. The patient has 2 grandchildren, [name] who works at a Scientist, physiological business and is 73 years old, in general, 28, completing high school.   ADVANCED DIRECTIVES:  HEALTH MAINTENANCE: History  Substance Use Topics  . Smoking status: Former Smoker    Quit date: 12/23/1974  . Smokeless tobacco: Never Used  . Alcohol Use: No     Colonoscopy:  PAP:    Bone density:  2012, on ibandronate chronically  Lipid panel: UTD  Allergies  Allergen Reactions  . Gadolinium Derivatives Other (See Comments)    Pt began having chest numbness/tingling , stated her heart felt funny, had to take her to ED for EKG per RN   CAP    Current  Outpatient Prescriptions  Medication Sig Dispense Refill  . diclofenac sodium (VOLTAREN) 1 % GEL Apply 2 g topically 4 (four) times daily.  2 Tube  2  . Dietary Management Product (VASCULERA) TABS Take 1 capsule by mouth daily.  30 tablet  11  . docusate sodium (COLACE) 100 MG capsule Take 100 mg by mouth 2 (two) times daily as needed for constipation.      Marland Kitchen ezetimibe (ZETIA) 10 MG tablet Take 1 tablet (10 mg total) by mouth daily.  90 tablet  3  . fentaNYL (DURAGESIC - DOSED MCG/HR) 25 MCG/HR patch Place 1 patch (25 mcg total) onto the skin every 3 (three) days. Fill on or after 10/19/13  10 patch  0  . furosemide (LASIX) 40 MG tablet Take 40 mg by mouth daily.      Marland Kitchen  hyaluronate sodium (RADIAPLEXRX) GEL Apply 1 application topically 2 (two) times daily. Apply to affected area after rad txs and bedtime      . levothyroxine (SYNTHROID) 175 MCG tablet Take 1 tablet (175 mcg total) by mouth daily before breakfast.  90 tablet  1  . lubiprostone (AMITIZA) 24 MCG capsule Take 1 capsule (24 mcg total) by mouth 2 (two) times daily with a meal.  180 capsule  1  . metFORMIN (GLUCOPHAGE) 500 MG tablet Take 1 tablet (500 mg total) by mouth daily.  90 tablet  3  . oxyCODONE (ROXICODONE) 5 MG immediate release tablet 1-2 every 4-6 hours prn pain fill on or after 11/22/13  65 tablet  0  . Polyethyl Glycol-Propyl Glycol (SYSTANE ULTRA OP) Place 1 drop into both eyes daily.      . potassium chloride SA (K-DUR,KLOR-CON) 20 MEQ tablet Take 1 tablet (20 mEq total) by mouth daily.  90 tablet  1  . pregabalin (LYRICA) 75 MG capsule Take 1 capsule (75 mg total) by mouth 2 (two) times daily. Not taking  60 capsule  5  . zolendronic acid (ZOMETA) 4 MG/5ML injection Inject 4 mg into the vein every 30 (thirty) days.       No current facility-administered medications for this visit.   PHYSICAL EXAM: Elderly African American woman who appears deconditioned  Filed Vitals:   12/28/13 1104  BP: 148/87  Pulse: 69  Temp: 99  F (37.2 C)  Resp: 18   Body mass index is 40.92 kg/(m^2).    ECOG FS: 2 Filed Weights   12/28/13 1104  Weight: 209 lb 8 oz (95.029 kg)     sclerae are anicteric, EOMs intact  Oropharynx clear and moist--  teeth in poor repair No cervical or supraclavicular adenopathy Lungs no rales or rhonchi, fair excursion bilaterally  Heart regular rate and rhythm, no murmur appreciated  Abd soft, obese,  nontender, positive bowel sounds MSK no focal spinal tenderness, chronic grade 2 right upper extremity lymphedema Neuro: nonfocal, well oriented,  anxiousaffect Breasts: The right breast is status post prior lumpectomy. There R. no skin or nipple changes of concern . The right axilla is benign. The left breast is unremarkable   LAB RESULTS: Lab Results  Component Value Date   WBC 6.3 12/11/2013   NEUTROABS 3.3 12/11/2013   HGB 12.0 12/11/2013   HCT 36.2 12/11/2013   MCV 87.9 12/11/2013   PLT 266 12/11/2013      Chemistry      Component Value Date/Time   NA 142 12/11/2013 1107   NA 138 11/27/2013 1404   K 4.0 12/11/2013 1107   K 3.3* 11/27/2013 1404   CL 101 11/27/2013 1404   CL 105 01/23/2013 1337   CO2 25 12/11/2013 1107   CO2 27 11/27/2013 1404   BUN 10.1 12/11/2013 1107   BUN 12 11/27/2013 1404   CREATININE 0.8 12/11/2013 1107   CREATININE 0.7 11/27/2013 1404      Component Value Date/Time   CALCIUM 9.7 12/11/2013 1107   CALCIUM 9.6 11/27/2013 1404   ALKPHOS 64 12/11/2013 1107   ALKPHOS 58 11/27/2013 1404   AST 14 12/11/2013 1107   AST 17 11/27/2013 1404   ALT 7 12/11/2013 1107   ALT 14 11/27/2013 1404   BILITOT 0.33 12/11/2013 1107   BILITOT 0.6 11/27/2013 1404       Lab Results  Component Value Date   LABCA2 24 11/19/2011    STUDIES: Mr Abdomen W Wo Contrast  12/06/2013   CLINICAL DATA:  History of breast cancer. Possible liver metastases.  EXAM: MRI ABDOMEN WITHOUT AND WITH CONTRAST  TECHNIQUE: Multiplanar multisequence MR imaging of the abdomen was performed both before and after the  administration of intravenous contrast.  CONTRAST:  48m MULTIHANCE GADOBENATE DIMEGLUMINE 529 MG/ML IV SOLN  COMPARISON:  Chest CT from 10/16/2013.  PET-CT from 01/13/2013.  FINDINGS: The patient was unable to complete the exam secondary to Chest numbness and tingling. The study was stopped shortly after administration of the intravenous contrast material.  Imaging of the lower chest shows a 3.9 cm homogeneous T2 hyperintense collection in the medial right breast, likely a seroma or hematoma.  5.6 x 5.7 cm heterogeneously enhancing mass with central necrosis is identified in the lateral segment of the left liver. A similar 3.0 x 3.5 cm heterogeneously enhancing mass in the central right liver has several smaller adjacent enhancing nodules measuring in the 5-15 mm size range. Additional 8 mm enhancing nodule is seen in the anterior right hepatic dome.  Spleen is unremarkable. The stomach, duodenum, pancreas, gallbladder, and adrenal glands are unremarkable. No evidence for enhancing mass lesion in either kidney.  No abdominal aortic aneurysm.  No lymphadenopathy in the abdomen  No abnormal marrow enhancement identified within the visualized bony structures.  IMPRESSION: Multiple liver lesions consistent with metastases. The largest is in the lateral segment of the left liver measuring 5.7 cm. Another dominant 3.5 cm lesion is identified in the central right liver.   Electronically Signed   By: EMisty StanleyM.D.   On: 12/06/2013 14:29     ASSESSMENT: 73y.o.  [place] woman with stage IV [bone only] breast cancer in a  (1)  status post right lumpectomy and axillary lymph node dissection 11/17/2010 for a pT2 pN1, stage IIB invasive ductal carcinoma, grade 2, estrogen and progesterone receptor positive, HER-2 negative,   (2) Oncotype DX recurrence score of 27 predicting a risk of distant recurrence of 18% with 5 years of tamoxifen (intermediate score);   (3)  status post radiation completed July of 2012,    (4) started anastrozole July 2012, discontinued June 2014 with evidence of metastatic spread to bone  (5) Chronic lymphedema in the right upper extremity.  (6) pathologic fracture of the left humerus along apparent lytic lesion, with no other bone lesions per bone scan 12/12/2012  (7) on 12/27/2012 underwent #1 intramedullary nail left pathologic proximal humerus fracture  #2 left shoulder rotator cuff repair  #3 left shoulder open bone biopsy with pathology showing metastatic breast adenocarcinoma, estrogen receptor 100% positive, progesterone receptor and HER-2 negative, with an MIB-1 of 26%. PET scan 01/13/2013 showed no other areas of disease and  (8) status post 35 Gy to the left proximal humerus completed 03/13/2013(  (9) fulvestrant, first injections on 01/23/2013  (10) zolendronic acid given monthly, first dose 01/23/2013  (11) multiple liver lesions noted on scans April 2015  (12) letrozole added May 2015   PLAN:   I. spent approximately 45 minutes going over Marni's situation with her. We have long known that she has metastatic disease and she understands that this is not curable. Bone only disease however has a relatively good prognosis. She now has evidence of liver involvement, although this is not symptomatic.  The first question always to ask in this setting is whether continued or intensificatied treatment is appropriate as opposed to a palliative or comfort approach. In this case there is no question that we have multiple treatment options  that can prolong the patient's life without significantly affecting her quality of life. The next question is whether we should go with chemotherapy at this point we'll consider further antiestrogen manipulations.  Maja is not receptive to the idea of chemotherapy and given her overall functional status I think she would tolerated poorly. This means we would not be able to give her full doses and therefore the risk of a  strong or prolonged response is low. There are many antiestrogen options, including switching to letrozole plus Ibrance, switching to exemestane plus everolimus, or simply adding letrozole to the fulvestrant.  Eviana would prefer the latter alternative. The data for "double antiestrogen treatment" is mixed, with some studies showing no additional benefit and other studies showing significantly improved control. I would not expect to letrozole to affect her quality of life, and of course she is about to undergo cardiac testing which may give [place] further reasons to avoid very aggressive therapy  In short after much discussion we decided what we would do is to start letrozole now and continue the fulvestrant and zolendronate as before. She will return to see me in early August to make sure she is tolerating the letrozole well and to check her lab work. She will see me again in September and shortly before that visit she will have a repeat liver MRI.  Ed has a good understanding of the overall plan. She agrees with it. She knows a goal of treatment in her case is control. She will call with any problems that may develop before the next visit.     Virgie Dad Conard Alvira    12/28/2013

## 2013-12-28 NOTE — Telephone Encounter (Signed)
, °

## 2014-01-08 ENCOUNTER — Ambulatory Visit: Payer: Medicare Other

## 2014-01-08 ENCOUNTER — Ambulatory Visit (HOSPITAL_BASED_OUTPATIENT_CLINIC_OR_DEPARTMENT_OTHER): Payer: Medicare Other

## 2014-01-08 ENCOUNTER — Other Ambulatory Visit: Payer: Medicare Other

## 2014-01-08 DIAGNOSIS — C50512 Malignant neoplasm of lower-outer quadrant of left female breast: Secondary | ICD-10-CM

## 2014-01-08 DIAGNOSIS — C7951 Secondary malignant neoplasm of bone: Secondary | ICD-10-CM

## 2014-01-08 DIAGNOSIS — C7952 Secondary malignant neoplasm of bone marrow: Secondary | ICD-10-CM

## 2014-01-08 DIAGNOSIS — M899 Disorder of bone, unspecified: Secondary | ICD-10-CM

## 2014-01-08 DIAGNOSIS — C50519 Malignant neoplasm of lower-outer quadrant of unspecified female breast: Secondary | ICD-10-CM

## 2014-01-08 DIAGNOSIS — Z5111 Encounter for antineoplastic chemotherapy: Secondary | ICD-10-CM

## 2014-01-08 DIAGNOSIS — C50919 Malignant neoplasm of unspecified site of unspecified female breast: Secondary | ICD-10-CM

## 2014-01-08 LAB — CBC WITH DIFFERENTIAL/PLATELET
BASO%: 0.3 % (ref 0.0–2.0)
Basophils Absolute: 0 10*3/uL (ref 0.0–0.1)
EOS%: 4.5 % (ref 0.0–7.0)
Eosinophils Absolute: 0.3 10*3/uL (ref 0.0–0.5)
HCT: 35.2 % (ref 34.8–46.6)
HGB: 11.5 g/dL — ABNORMAL LOW (ref 11.6–15.9)
LYMPH%: 32.7 % (ref 14.0–49.7)
MCH: 28.8 pg (ref 25.1–34.0)
MCHC: 32.7 g/dL (ref 31.5–36.0)
MCV: 88 fL (ref 79.5–101.0)
MONO#: 0.5 10*3/uL (ref 0.1–0.9)
MONO%: 8.1 % (ref 0.0–14.0)
NEUT#: 3.3 10*3/uL (ref 1.5–6.5)
NEUT%: 54.4 % (ref 38.4–76.8)
Platelets: 263 10*3/uL (ref 145–400)
RBC: 4 10*6/uL (ref 3.70–5.45)
RDW: 14.9 % — ABNORMAL HIGH (ref 11.2–14.5)
WBC: 6.1 10*3/uL (ref 3.9–10.3)
lymph#: 2 10*3/uL (ref 0.9–3.3)
nRBC: 0 % (ref 0–0)

## 2014-01-08 LAB — COMPREHENSIVE METABOLIC PANEL (CC13)
ALBUMIN: 3 g/dL — AB (ref 3.5–5.0)
ALK PHOS: 59 U/L (ref 40–150)
ALT: 12 U/L (ref 0–55)
AST: 17 U/L (ref 5–34)
Anion Gap: 12 mEq/L — ABNORMAL HIGH (ref 3–11)
BUN: 11 mg/dL (ref 7.0–26.0)
CO2: 24 mEq/L (ref 22–29)
CREATININE: 0.8 mg/dL (ref 0.6–1.1)
Calcium: 9.5 mg/dL (ref 8.4–10.4)
Chloride: 107 mEq/L (ref 98–109)
GLUCOSE: 105 mg/dL (ref 70–140)
POTASSIUM: 3.4 meq/L — AB (ref 3.5–5.1)
Sodium: 143 mEq/L (ref 136–145)
Total Bilirubin: 0.31 mg/dL (ref 0.20–1.20)
Total Protein: 7 g/dL (ref 6.4–8.3)

## 2014-01-08 MED ORDER — ZOLEDRONIC ACID 4 MG/100ML IV SOLN
4.0000 mg | Freq: Once | INTRAVENOUS | Status: AC
  Administered 2014-01-08: 4 mg via INTRAVENOUS
  Filled 2014-01-08: qty 100

## 2014-01-08 MED ORDER — SODIUM CHLORIDE 0.9 % IV SOLN
INTRAVENOUS | Status: DC
  Administered 2014-01-08: 12:00:00 via INTRAVENOUS

## 2014-01-08 MED ORDER — FULVESTRANT 250 MG/5ML IM SOLN
500.0000 mg | Freq: Once | INTRAMUSCULAR | Status: AC
  Administered 2014-01-08: 500 mg via INTRAMUSCULAR
  Filled 2014-01-08: qty 10

## 2014-01-08 NOTE — Patient Instructions (Signed)

## 2014-01-11 ENCOUNTER — Encounter (HOSPITAL_COMMUNITY): Payer: Medicare Other | Attending: Cardiology | Admitting: Radiology

## 2014-01-11 VITALS — BP 104/60 | Ht 60.0 in | Wt 209.0 lb

## 2014-01-11 DIAGNOSIS — R079 Chest pain, unspecified: Secondary | ICD-10-CM | POA: Insufficient documentation

## 2014-01-11 DIAGNOSIS — R0602 Shortness of breath: Secondary | ICD-10-CM

## 2014-01-11 MED ORDER — REGADENOSON 0.4 MG/5ML IV SOLN
0.4000 mg | Freq: Once | INTRAVENOUS | Status: AC
  Administered 2014-01-11: 0.4 mg via INTRAVENOUS

## 2014-01-11 MED ORDER — TECHNETIUM TC 99M SESTAMIBI GENERIC - CARDIOLITE
33.0000 | Freq: Once | INTRAVENOUS | Status: AC | PRN
  Administered 2014-01-11: 33 via INTRAVENOUS

## 2014-01-11 NOTE — Progress Notes (Signed)
[facility] [address] [place], [place] [address] [phone]    Cardiology Nuclear Med Study  Christy Harrell is a 73 y.o. female     MRN : [id number]     DOB: Oct 29, 1940  Procedure Date: 01/11/2014  Nuclear Med Background Indication for Stress Test:  Evaluation for Ischemia and Abnormal EKG History:  Ct with Coronary Calcifications Cardiac Risk Factors: History of Smoking, Hypertension, Lipids and NIDDM  Symptoms:  Chest Pain and DOE   Nuclear Pre-Procedure Caffeine/Decaff Intake:  None NPO After: 6:30am   Lungs:  clear O2 Sat: 99% on room air. IV 0.9% NS with Angio Cath:  24g  IV Site: L Antecubital  IV Started by:  Crissie Figures, RN  Chest Size (in):  42 Cup Size: D  Height: 5' (1.524 m)  Weight:  209 lb (94.802 kg)  BMI:  Body mass index is 40.82 kg/(m^2). Tech Comments:  N/A    Nuclear Med Study 1 or 2 day study: 2 day  Stress Test Type:  Lexiscan  Reading MD: N/A  Order Authorizing Provider:  Fransico Him, MD  Resting Radionuclide: Technetium 59m Sestamibi  Resting Radionuclide Dose: 33.0 mCi on 01/18/14   Stress Radionuclide:  Technetium 54m Sestamibi  Stress Radionuclide Dose: 33.0 mCi on 01/11/14           Stress Protocol Rest HR: 70 Stress HR: 97  Rest BP: 104/60 Stress BP: 107/49  Exercise Time (min): n/a METS: n/a   Predicted Max HR: 148 bpm % Max HR: 65.54 bpm Rate Pressure Product: 10379   Dose of Adenosine (mg):  n/a Dose of Lexiscan: 0.4 mg  Dose of Atropine (mg): n/a Dose of Dobutamine: n/a mcg/kg/min (at max HR)  Stress Test Technologist: Crissie Figures, RN  Nuclear Technologist:  Charlton Amor, CNMT     Rest Procedure:  Myocardial perfusion imaging was performed at rest 45 minutes following the intravenous administration of Technetium 52m Sestamibi. Rest ECG: NSR-[id number]  Stress Procedure:  The patient received IV Lexiscan 0.4 mg over 15-seconds.  Technetium 15m Sestamibi injected at 30-seconds.  Quantitative spect  images were obtained after a 45 minute delay. Stress ECG: No significant change from baseline ECG  QPS Raw Data Images:  Normal; no motion artifact; normal heart/lung ratio. Stress Images:  Normal homogeneous uptake in all areas of the myocardium. Rest Images:  Normal homogeneous uptake in all areas of the myocardium. Subtraction (SDS):  No evidence of ischemia. Transient Ischemic Dilatation (Normal <1.22):  1.03 Lung/Heart Ratio (Normal <0.45):  0.27  Quantitative Gated Spect Images QGS EDV:  71 ml QGS ESV:  26 ml  Impression Exercise Capacity:  Lexiscan with no exercise. BP Response:  Normal blood pressure response. Clinical Symptoms:  Mild chest pain/dyspnea. ECG Impression:  No significant ECG changes with Lexiscan. Comparison with Prior Nuclear Study: No images to compare  Overall Impression:  Normal stress nuclear study.  LV Ejection Fraction: 64%.  LV Wall Motion:  NL LV Function; NL Wall Motion   Darlin Coco MD

## 2014-01-12 ENCOUNTER — Other Ambulatory Visit: Payer: Medicare Other

## 2014-01-12 ENCOUNTER — Ambulatory Visit: Payer: Medicare Other

## 2014-01-15 ENCOUNTER — Ambulatory Visit (INDEPENDENT_AMBULATORY_CARE_PROVIDER_SITE_OTHER): Payer: Medicare Other | Admitting: Internal Medicine

## 2014-01-15 ENCOUNTER — Encounter: Payer: Self-pay | Admitting: Internal Medicine

## 2014-01-15 ENCOUNTER — Ambulatory Visit: Payer: Medicare Other | Admitting: Internal Medicine

## 2014-01-15 VITALS — BP 138/68 | HR 106 | Temp 98.8°F | Wt 210.6 lb

## 2014-01-15 DIAGNOSIS — M5030 Other cervical disc degeneration, unspecified cervical region: Secondary | ICD-10-CM

## 2014-01-15 MED ORDER — MELOXICAM 7.5 MG PO TABS: 7.5000 mg | ORAL_TABLET | Freq: Every day | ORAL | Status: DC

## 2014-01-15 NOTE — Patient Instructions (Signed)
The Physical Therapy referral will be scheduled and you'll be notified of the time.   Use the anti-inflammatory cream Voltaren 2-4 X per day to the affected area as needed. In lieu of this warm moist compresses or  hot water bottle can be used. Do not apply ice .

## 2014-01-15 NOTE — Progress Notes (Signed)
Pre visit review using our clinic review tool, if applicable. No additional management support is needed unless otherwise documented below in the visit note. 

## 2014-01-15 NOTE — Progress Notes (Signed)
   Subjective:    Patient ID: Christy Harrell, female    DOB: 05/12/1941, 73 y.o.   MRN: [id number]  HPI  She has had pain in the right neck and supraclavicular/medial shoulder area since 01/12/14. There was no specific trigger or injury.  It is described as throbbing and swelling. She's had similar symptoms to a lesser degree on the left as well.  She questioned whether it might be related to lymphedema in her right upper extremity. She wears a compression wrap.  Oxycodone has not been of benefit. She's also applied some topical Voltaren gel.  She has a history of right breast cancer and is on chemotherapy. This is associated with hot flashes    Review of Systems  She denies fever, chills, unexplained weight loss.  She's had no blurred vision, diplopia, loss of vision  There's been no associated headaches  There is no weakness, numbness, tingling in the upper extremities.  There's been no rash or change in color/temperature of skin in the area of symptoms     Objective:   Physical Exam She appears adequately nourished but is morbidly obese. She has no conjunctivitis or scleral icterus. Markedly decreased range of motion with right lateral neck rotation. This elicits her pain. The right upper extremity is dramatically lymphedematous despite wearing compression device. Regular rhythm with no significant murmur. No carotid bruits are noted. No lymphadenopathy is present in the axilla or cervical areas. She has significant PIP osteoarthritic changes hands.        Assessment & Plan:  #1 cervical disc disease  Plan: Trial of Mobic short-term.  Physical therapy referral

## 2014-01-16 ENCOUNTER — Encounter (HOSPITAL_COMMUNITY): Payer: Medicare Other

## 2014-01-17 ENCOUNTER — Encounter: Payer: Self-pay | Admitting: Internal Medicine

## 2014-01-18 ENCOUNTER — Encounter (HOSPITAL_COMMUNITY): Payer: Medicare Other | Attending: Internal Medicine

## 2014-01-18 DIAGNOSIS — R0989 Other specified symptoms and signs involving the circulatory and respiratory systems: Secondary | ICD-10-CM

## 2014-01-18 MED ORDER — TECHNETIUM TC 99M SESTAMIBI GENERIC - CARDIOLITE
33.0000 | Freq: Once | INTRAVENOUS | Status: AC | PRN
Start: 2014-01-18 — End: 2014-01-18
  Administered 2014-01-18: 33 via INTRAVENOUS

## 2014-01-22 ENCOUNTER — Encounter (INDEPENDENT_AMBULATORY_CARE_PROVIDER_SITE_OTHER): Payer: Self-pay

## 2014-01-22 ENCOUNTER — Encounter: Payer: Self-pay | Admitting: Cardiology

## 2014-01-22 ENCOUNTER — Ambulatory Visit (INDEPENDENT_AMBULATORY_CARE_PROVIDER_SITE_OTHER): Payer: Medicare Other | Admitting: Cardiology

## 2014-01-22 VITALS — BP 118/60 | HR 62 | Ht 60.0 in | Wt 205.4 lb

## 2014-01-22 DIAGNOSIS — I452 Bifascicular block: Secondary | ICD-10-CM

## 2014-01-22 DIAGNOSIS — R079 Chest pain, unspecified: Secondary | ICD-10-CM

## 2014-01-22 DIAGNOSIS — I2510 Atherosclerotic heart disease of native coronary artery without angina pectoris: Secondary | ICD-10-CM

## 2014-01-22 DIAGNOSIS — E785 Hyperlipidemia, unspecified: Secondary | ICD-10-CM

## 2014-01-22 MED ORDER — ASPIRIN EC 81 MG PO TBEC: 81.0000 mg | DELAYED_RELEASE_TABLET | Freq: Every day | ORAL | Status: AC

## 2014-01-22 MED ORDER — ATORVASTATIN CALCIUM 40 MG PO TABS: 40.0000 mg | ORAL_TABLET | Freq: Every day | ORAL | Status: DC

## 2014-01-22 NOTE — Progress Notes (Signed)
[facility], [address] [place], [place]  [address] Phone: [phone] Fax:  [phone]  Date:  01/22/2014   ID:  Kaylei Frink, DOB 11-05-1940, MRN [id number]  PCP:  [name], MD  Cardiologist:  Fransico Him, MD     History of Present Illness: Christy Harrell is a 73 y.o. female with a history of HTN, DM and dyslipidemia who presents today for evaluation of recent abnormal EKG. 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 The RBBB has been present since 2013. She was seen by her PCP on 11/27/13 and an EKG showed ectopic atrial rhythm which had resolved by EKG done on 12/06/13. She has been having a sensation of fatigue in her chest and has some discomfort that she had a difficulty describing. 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 She has surgery last year on the shoulder and has chronic shoulder pain on the left along with left arm pain. She gets tired easily with exertion. 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 She has been under a lot of stress with her daughter who had brain surgery and her grandson has been ill as well. She says that she gets SOB with minimal exertion and over her left breast that occurs with exertion and she will sit down and rest and then it resolves. She also has been belching. She recently had a Chest CT for her breast CA that showed coronary artery calcification in the LAD and all 3 vessels. Nuclear stress test has been done but results are pending.  She thinks that her CP is due to anxiety and stress from taking care of her mom.  She says that she is crying a lot.  She does not get the funny feeling in her chest except when she gets upset emotionally.   Wt Readings from Last 3 Encounters:  01/22/14 205 lb 6.4 oz (93.169 kg)  01/15/14 210 lb 9.6 oz (95.528 kg)  01/11/14 209 lb (94.802 kg)     Past Medical History  Diagnosis Date  . Cancer     right breast  . Club foot   .  Arthritis   . Thyroid disease   . Hyperlipidemia   . Diabetes mellitus   . Osteoporosis   . Hypertension   . GERD (gastroesophageal reflux disease)     watches diet  . Neuromuscular disorder     lt arm numb sometimes  . Wears glasses   . 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 History of radiation therapy 02/22/13- 03/13/13    left proximal humerus 3500 cGy 14 sessions  . Lymphedema     right arm  . 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 diclofenac sodium (VOLTAREN) 1 % GEL Apply 2 g topically 4 (four) times daily.  2 Tube  2  . Dietary Management Product (VASCULERA) TABS Take 1 capsule by mouth daily.  30 tablet  11  . docusate sodium (COLACE) 100 MG capsule Take 100 mg by mouth 2 (two) times daily as needed for constipation.      Marland Kitchen ezetimibe (ZETIA) 10 MG tablet Take 1 tablet (10 mg total) by mouth daily.  90 tablet  3  . fentaNYL (DURAGESIC - DOSED MCG/HR) 25 MCG/HR patch Place 1 patch (25 mcg total)  onto the skin every 3 (three) days. Fill on or after 10/19/13  10 patch  0  . furosemide (LASIX) 40 MG tablet Take 40 mg by mouth daily.      Marland Kitchen letrozole (FEMARA) 2.5 MG tablet Take 1 tablet (2.5 mg total) by mouth daily.  30 tablet  12  . levothyroxine (SYNTHROID) 175 MCG tablet Take 1 tablet (175 mcg total) by mouth daily before breakfast.  90 tablet  1  . lubiprostone (AMITIZA) 24 MCG capsule Take 1 capsule (24 mcg total) by mouth 2 (two) times daily with a meal.  180 capsule  1  . meloxicam (MOBIC) 7.5 MG tablet Take 1 tablet (7.5 mg total) by mouth daily.  20 tablet  0  . metFORMIN (GLUCOPHAGE) 500 MG tablet Take 1 tablet (500 mg total) by mouth daily.  90 tablet  3  . oxyCODONE (OXY IR/ROXICODONE) 5 MG immediate release tablet Take 1-2 tablets every 4-6 hours as needed for pain - fill on or after 11/22/13      . Polyethyl Glycol-Propyl Glycol (SYSTANE ULTRA OP) Place 1 drop  into both eyes daily.      . potassium chloride SA (K-DUR,KLOR-CON) 20 MEQ tablet Take 1 tablet (20 mEq total) by mouth daily.  90 tablet  1  . pregabalin (LYRICA) 75 MG capsule Take 1 capsule (75 mg total) by mouth 2 (two) times daily. Not taking  60 capsule  5  . zolendronic acid (ZOMETA) 4 MG/5ML injection Inject 4 mg into the vein every 30 (thirty) days.       No current facility-administered medications for this visit.    Allergies:    Allergies  Allergen Reactions  . Gadolinium Derivatives Other (See Comments)    Pt began having chest numbness/tingling , stated her heart felt funny, had to take her to ED for EKG per RN   CAP    Social History:  The patient  reports that she quit smoking about 39 years ago. She has never used smokeless tobacco. She reports that she does not drink alcohol or use illicit drugs.   Family History:  The patient's family history includes Dementia in her mother; Hearing loss in her mother. There is no history of Cancer, Arthritis, Heart disease, Hyperlipidemia, or Hypertension.   ROS:  Please see the history of present illness.      All other systems reviewed and negative.   PHYSICAL EXAM: VS:  BP 118/60  Pulse 62  Ht 5' (1.524 m)  Wt 205 lb 6.4 oz (93.169 kg)  BMI 40.11 kg/m2 Well nourished, well developed, in no acute distress HEENT: normal Neck: no JVD Cardiac:  normal S1, S2; RRR; no murmur Lungs:  clear to auscultation bilaterally, no wheezing, rhonchi or rales Abd: soft, nontender, no hepatomegaly Ext: no edema Skin: warm and dry Neuro:  CNs 2-12 intact, no focal abnormalities noted    ASSESSMENT AND PLAN:  1. RBBB with LAFB - noted since EKG 2013. She had a recent chest CT showing atherosclerosis of the left main and 3 vessel CAD. 2. Coronary artery calcifications by chest CT 3. Chest discomfort that is vague by description and hard to determine whether it is from her breast cancer with mets to her left should with subsequent extensive  shoulder surgery or if chest discomfort is due to coronary ischemia.  Lexiscan myoview showed normal myocardial perfusion with no evidence of ischemia.  No further workup at this time.  I have instructed her to take a baby ASA  81mg  daily.  I instructed her to let me know if she develops any exertional CP or SOB.  She needs aggressive risk factor modification.   4. Dyslipidemia - LDL is not at goal.  Will continue Zetia and add Lipitor 40mg  daily.  Check fasting lipids and ALT in 6 weeks.  Followup with me in 1 year  Signed, Fransico Him, MD 01/22/2014 3:06 PM

## 2014-01-22 NOTE — Patient Instructions (Signed)
Your physician has recommended you make the following change in your medication: 1. Start Aspirin 81 MG 1 tablet daily 2. Start Lipitor 40 Mg 1 tablet daily  Your physician recommends that you return for a FASTING lipid profile and ALT on 03/05/14  Your physician wants you to follow-up in: 1 year with Dr Mallie Snooks will receive a reminder letter in the mail two months in advance. If you don't receive a letter, please call our office to schedule the follow-up appointment.

## 2014-01-31 ENCOUNTER — Other Ambulatory Visit: Payer: Self-pay | Admitting: Physician Assistant

## 2014-02-12 ENCOUNTER — Other Ambulatory Visit (HOSPITAL_BASED_OUTPATIENT_CLINIC_OR_DEPARTMENT_OTHER): Payer: Medicare Other

## 2014-02-12 ENCOUNTER — Ambulatory Visit (HOSPITAL_BASED_OUTPATIENT_CLINIC_OR_DEPARTMENT_OTHER): Payer: Medicare Other

## 2014-02-12 ENCOUNTER — Other Ambulatory Visit: Payer: Self-pay | Admitting: *Deleted

## 2014-02-12 VITALS — BP 131/74 | HR 70 | Temp 98.6°F | Resp 19

## 2014-02-12 DIAGNOSIS — M899 Disorder of bone, unspecified: Secondary | ICD-10-CM

## 2014-02-12 DIAGNOSIS — C50919 Malignant neoplasm of unspecified site of unspecified female breast: Secondary | ICD-10-CM

## 2014-02-12 DIAGNOSIS — C50519 Malignant neoplasm of lower-outer quadrant of unspecified female breast: Secondary | ICD-10-CM

## 2014-02-12 DIAGNOSIS — C7951 Secondary malignant neoplasm of bone: Secondary | ICD-10-CM

## 2014-02-12 DIAGNOSIS — C7952 Secondary malignant neoplasm of bone marrow: Secondary | ICD-10-CM

## 2014-02-12 DIAGNOSIS — Z5111 Encounter for antineoplastic chemotherapy: Secondary | ICD-10-CM

## 2014-02-12 LAB — CBC WITH DIFFERENTIAL/PLATELET
BASO%: 0.9 % (ref 0.0–2.0)
Basophils Absolute: 0.1 10*3/uL (ref 0.0–0.1)
EOS%: 3 % (ref 0.0–7.0)
Eosinophils Absolute: 0.2 10*3/uL (ref 0.0–0.5)
HEMATOCRIT: 35.4 % (ref 34.8–46.6)
HGB: 11.5 g/dL — ABNORMAL LOW (ref 11.6–15.9)
LYMPH#: 2.1 10*3/uL (ref 0.9–3.3)
LYMPH%: 35.8 % (ref 14.0–49.7)
MCH: 29 pg (ref 25.1–34.0)
MCHC: 32.5 g/dL (ref 31.5–36.0)
MCV: 89.1 fL (ref 79.5–101.0)
MONO#: 0.5 10*3/uL (ref 0.1–0.9)
MONO%: 9.1 % (ref 0.0–14.0)
NEUT#: 3 10*3/uL (ref 1.5–6.5)
NEUT%: 51.2 % (ref 38.4–76.8)
Platelets: 260 10*3/uL (ref 145–400)
RBC: 3.98 10*6/uL (ref 3.70–5.45)
RDW: 15.2 % — ABNORMAL HIGH (ref 11.2–14.5)
WBC: 6 10*3/uL (ref 3.9–10.3)

## 2014-02-12 LAB — COMPREHENSIVE METABOLIC PANEL (CC13)
ALT: 12 U/L (ref 0–55)
ANION GAP: 7 meq/L (ref 3–11)
AST: 16 U/L (ref 5–34)
Albumin: 3.2 g/dL — ABNORMAL LOW (ref 3.5–5.0)
Alkaline Phosphatase: 58 U/L (ref 40–150)
BUN: 14.2 mg/dL (ref 7.0–26.0)
CALCIUM: 9.7 mg/dL (ref 8.4–10.4)
CHLORIDE: 107 meq/L (ref 98–109)
CO2: 26 meq/L (ref 22–29)
Creatinine: 0.8 mg/dL (ref 0.6–1.1)
Glucose: 100 mg/dl (ref 70–140)
Potassium: 4 mEq/L (ref 3.5–5.1)
SODIUM: 140 meq/L (ref 136–145)
TOTAL PROTEIN: 7.2 g/dL (ref 6.4–8.3)
Total Bilirubin: 0.26 mg/dL (ref 0.20–1.20)

## 2014-02-12 MED ORDER — SODIUM CHLORIDE 0.9 % IV SOLN
INTRAVENOUS | Status: DC
  Administered 2014-02-12: 13:00:00 via INTRAVENOUS

## 2014-02-12 MED ORDER — FULVESTRANT 250 MG/5ML IM SOLN
500.0000 mg | Freq: Once | INTRAMUSCULAR | Status: AC
  Administered 2014-02-12: 500 mg via INTRAMUSCULAR
  Filled 2014-02-12: qty 10

## 2014-02-12 MED ORDER — ZOLEDRONIC ACID 4 MG/100ML IV SOLN
4.0000 mg | Freq: Once | INTRAVENOUS | Status: AC
  Administered 2014-02-12: 4 mg via INTRAVENOUS
  Filled 2014-02-12: qty 100

## 2014-02-12 NOTE — Progress Notes (Signed)
This RN was contacted by the treatment room per pt's request for a port due to multiple IV sticks for zometa infusion.  Per review with MD - recommendation is to switch to xgeva if insurance will cover.  This RN sent an in box message to [facility] in managed care.

## 2014-02-12 NOTE — Patient Instructions (Signed)
Zoledronic Acid injection (Hypercalcemia, Oncology) What is this medicine? ZOLEDRONIC ACID (ZOE le dron ik AS id) lowers the amount of calcium loss from bone. It is used to treat too much calcium in your blood from cancer. It is also used to prevent complications of cancer that has spread to the bone. This medicine may be used for other purposes; ask your health care provider or pharmacist if you have questions. COMMON BRAND NAME(S): Zometa What should I tell my health care provider before I take this medicine? They need to know if you have any of these conditions: -aspirin-sensitive asthma -cancer, especially if you are receiving medicines used to treat cancer -dental disease or wear dentures -infection -kidney disease -receiving corticosteroids like dexamethasone or prednisone -an unusual or allergic reaction to zoledronic acid, other medicines, foods, dyes, or preservatives -pregnant or trying to get pregnant -breast-feeding How should I use this medicine? This medicine is for infusion into a vein. It is given by a health care professional in a hospital or clinic setting. Talk to your pediatrician regarding the use of this medicine in children. Special care may be needed. Overdosage: If you think you have taken too much of this medicine contact a poison control center or emergency room at once. NOTE: This medicine is only for you. Do not share this medicine with others. What if I miss a dose? It is important not to miss your dose. Call your doctor or health care professional if you are unable to keep an appointment. What may interact with this medicine? -certain antibiotics given by injection -NSAIDs, medicines for pain and inflammation, like ibuprofen or naproxen -some diuretics like bumetanide, furosemide -teriparatide -thalidomide This list may not describe all possible interactions. Give your health care provider a list of all the medicines, herbs, non-prescription drugs, or  dietary supplements you use. Also tell them if you smoke, drink alcohol, or use illegal drugs. Some items may interact with your medicine. What should I watch for while using this medicine? Visit your doctor or health care professional for regular checkups. It may be some time before you see the benefit from this medicine. Do not stop taking your medicine unless your doctor tells you to. Your doctor may order blood tests or other tests to see how you are doing. Women should inform their doctor if they wish to become pregnant or think they might be pregnant. There is a potential for serious side effects to an unborn child. Talk to your health care professional or pharmacist for more information. You should make sure that you get enough calcium and vitamin D while you are taking this medicine. Discuss the foods you eat and the vitamins you take with your health care professional. Some people who take this medicine have severe bone, joint, and/or muscle pain. This medicine may also increase your risk for jaw problems or a broken thigh bone. Tell your doctor right away if you have severe pain in your jaw, bones, joints, or muscles. Tell your doctor if you have any pain that does not go away or that gets worse. Tell your dentist and dental surgeon that you are taking this medicine. You should not have major dental surgery while on this medicine. See your dentist to have a dental exam and fix any dental problems before starting this medicine. Take good care of your teeth while on this medicine. Make sure you see your dentist for regular follow-up appointments. What side effects may I notice from receiving this medicine? Side effects that   you should report to your doctor or health care professional as soon as possible: -allergic reactions like skin rash, itching or hives, swelling of the face, lips, or tongue -anxiety, confusion, or depression -breathing problems -changes in vision -eye pain -feeling faint or  lightheaded, falls -jaw pain, especially after dental work -mouth sores -muscle cramps, stiffness, or weakness -trouble passing urine or change in the amount of urine Side effects that usually do not require medical attention (report to your doctor or health care professional if they continue or are bothersome): -bone, joint, or muscle pain -constipation -diarrhea -fever -hair loss -irritation at site where injected -loss of appetite -nausea, vomiting -stomach upset -trouble sleeping -trouble swallowing -weak or tired This list may not describe all possible side effects. Call your doctor for medical advice about side effects. You may report side effects to FDA at 1-800-FDA-1088. Where should I keep my medicine? This drug is given in a hospital or clinic and will not be stored at home. NOTE: This sheet is a summary. It may not cover all possible information. If you have questions about this medicine, talk to your doctor, pharmacist, or health care provider.  2015, Elsevier/Gold Standard. (2013-01-05 13:03:13) Fulvestrant injection What is this medicine? FULVESTRANT (ful VES trant) blocks the effects of estrogen. It is used to treat breast cancer in women past the age of menopause. This medicine may be used for other purposes; ask your health care provider or pharmacist if you have questions. COMMON BRAND NAME(S): FASLODEX What should I tell my health care provider before I take this medicine? They need to know if you have any of these conditions: -bleeding problems -liver disease -low levels of platelets in the blood -an unusual or allergic reaction to fulvestrant, other medicines, foods, dyes, or preservatives -pregnant or trying to get pregnant -breast-feeding How should I use this medicine? This medicine is for injection into a muscle. It is usually given by a health care professional in a hospital or clinic setting. Talk to your pediatrician regarding the use of this  medicine in children. Special care may be needed. Overdosage: If you think you have taken too much of this medicine contact a poison control center or emergency room at once. NOTE: This medicine is only for you. Do not share this medicine with others. What if I miss a dose? It is important not to miss your dose. Call your doctor or health care professional if you are unable to keep an appointment. What may interact with this medicine? -medicines that treat or prevent blood clots like warfarin, enoxaparin, and dalteparin This list may not describe all possible interactions. Give your health care provider a list of all the medicines, herbs, non-prescription drugs, or dietary supplements you use. Also tell them if you smoke, drink alcohol, or use illegal drugs. Some items may interact with your medicine. What should I watch for while using this medicine? Your condition will be monitored carefully while you are receiving this medicine. You will need important blood work done while you are taking this medicine. Do not become pregnant while taking this medicine. Women should inform their doctor if they wish to become pregnant or think they might be pregnant. There is a potential for serious side effects to an unborn child. Talk to your health care professional or pharmacist for more information. What side effects may I notice from receiving this medicine? Side effects that you should report to your doctor or health care professional as soon as possible: -allergic reactions like   skin rash, itching or hives, swelling of the face, lips, or tongue -feeling faint or lightheaded, falls -fever or flu-like symptoms -sore throat -vaginal bleeding Side effects that usually do not require medical attention (report to your doctor or health care professional if they continue or are bothersome): -aches, pains -constipation or diarrhea -headache -hot flashes -nausea, vomiting -pain at site where  injected -stomach pain This list may not describe all possible side effects. Call your doctor for medical advice about side effects. You may report side effects to FDA at 1-800-FDA-1088. Where should I keep my medicine? This drug is given in a hospital or clinic and will not be stored at home. NOTE: This sheet is a summary. It may not cover all possible information. If you have questions about this medicine, talk to your doctor, pharmacist, or health care provider.  2015, Elsevier/Gold Standard. (2007-12-05 15:39:24)  

## 2014-02-12 NOTE — Progress Notes (Signed)
Patient stuck 6 times today for PIV. Agreeable to port placement. Desk RN made aware.

## 2014-02-21 ENCOUNTER — Other Ambulatory Visit: Payer: Self-pay | Admitting: *Deleted

## 2014-02-23 ENCOUNTER — Telehealth: Payer: Self-pay | Admitting: Oncology

## 2014-02-23 NOTE — Telephone Encounter (Signed)
per pof to chge pt infusion to inj-pt aware

## 2014-02-26 ENCOUNTER — Telehealth: Payer: Self-pay | Admitting: Cardiology

## 2014-02-26 NOTE — Telephone Encounter (Signed)
New problem     For the pass week pt has been having pain over her top left breast and would like to be seen.   Pt would like a call back.

## 2014-02-27 NOTE — Telephone Encounter (Signed)
Spoke with pt and she said it felt more like a pulling sensation not a pain. It is on the side where she hurt her shoulder. Per Dr Radford Pax, we asked that she call her primary care provider first to be seen by them. She agreed and will call their office today.

## 2014-03-05 ENCOUNTER — Other Ambulatory Visit (INDEPENDENT_AMBULATORY_CARE_PROVIDER_SITE_OTHER): Payer: Medicare Other

## 2014-03-05 DIAGNOSIS — E785 Hyperlipidemia, unspecified: Secondary | ICD-10-CM

## 2014-03-05 LAB — LIPID PANEL
CHOL/HDL RATIO: 3
Cholesterol: 217 mg/dL — ABNORMAL HIGH (ref 0–200)
HDL: 66.8 mg/dL (ref >39.00)
LDL Cholesterol: 133 mg/dL — ABNORMAL HIGH (ref 0–99)
NONHDL: 150.2
Triglycerides: 87 mg/dL (ref 0.0–149.0)
VLDL: 17.4 mg/dL (ref 0.0–40.0)

## 2014-03-05 LAB — ALT: ALT: 11 U/L (ref 0–35)

## 2014-03-07 ENCOUNTER — Encounter: Payer: Self-pay | Admitting: General Surgery

## 2014-03-08 ENCOUNTER — Encounter: Payer: Self-pay | Admitting: Internal Medicine

## 2014-03-08 ENCOUNTER — Other Ambulatory Visit (INDEPENDENT_AMBULATORY_CARE_PROVIDER_SITE_OTHER): Payer: Medicare Other

## 2014-03-08 ENCOUNTER — Ambulatory Visit (INDEPENDENT_AMBULATORY_CARE_PROVIDER_SITE_OTHER): Payer: Medicare Other | Admitting: Internal Medicine

## 2014-03-08 ENCOUNTER — Telehealth: Payer: Self-pay

## 2014-03-08 ENCOUNTER — Ambulatory Visit (INDEPENDENT_AMBULATORY_CARE_PROVIDER_SITE_OTHER)
Admission: RE | Admit: 2014-03-08 | Discharge: 2014-03-08 | Disposition: A | Discharge status: Home / Self Care | Payer: Medicare Other | Source: Ambulatory Visit | Attending: Internal Medicine | Admitting: Internal Medicine

## 2014-03-08 VITALS — BP 100/60 | HR 84 | Temp 98.3°F | Resp 16 | Ht 60.0 in | Wt 205.4 lb

## 2014-03-08 DIAGNOSIS — E039 Hypothyroidism, unspecified: Secondary | ICD-10-CM

## 2014-03-08 DIAGNOSIS — E1165 Type 2 diabetes mellitus with hyperglycemia: Secondary | ICD-10-CM

## 2014-03-08 DIAGNOSIS — M5412 Radiculopathy, cervical region: Secondary | ICD-10-CM

## 2014-03-08 DIAGNOSIS — M4692 Unspecified inflammatory spondylopathy, cervical region: Secondary | ICD-10-CM

## 2014-03-08 DIAGNOSIS — R059 Cough, unspecified: Secondary | ICD-10-CM | POA: Insufficient documentation

## 2014-03-08 DIAGNOSIS — M4680 Other specified inflammatory spondylopathies, site unspecified: Secondary | ICD-10-CM

## 2014-03-08 DIAGNOSIS — IMO0001 Reserved for inherently not codable concepts without codable children: Secondary | ICD-10-CM

## 2014-03-08 DIAGNOSIS — R05 Cough: Secondary | ICD-10-CM

## 2014-03-08 DIAGNOSIS — R7989 Other specified abnormal findings of blood chemistry: Secondary | ICD-10-CM

## 2014-03-08 DIAGNOSIS — R079 Chest pain, unspecified: Secondary | ICD-10-CM

## 2014-03-08 DIAGNOSIS — M159 Polyosteoarthritis, unspecified: Secondary | ICD-10-CM

## 2014-03-08 DIAGNOSIS — M5030 Other cervical disc degeneration, unspecified cervical region: Secondary | ICD-10-CM

## 2014-03-08 DIAGNOSIS — M4802 Spinal stenosis, cervical region: Secondary | ICD-10-CM

## 2014-03-08 DIAGNOSIS — R791 Abnormal coagulation profile: Secondary | ICD-10-CM

## 2014-03-08 LAB — BASIC METABOLIC PANEL
BUN: 12 mg/dL (ref 6–23)
CO2: 30 meq/L (ref 19–32)
Calcium: 9.6 mg/dL (ref 8.4–10.5)
Chloride: 105 mEq/L (ref 96–112)
Creatinine, Ser: 0.8 mg/dL (ref 0.4–1.2)
GFR: 90.53 mL/min (ref >60.00)
Glucose, Bld: 98 mg/dL (ref 70–99)
POTASSIUM: 3.7 meq/L (ref 3.5–5.1)
SODIUM: 139 meq/L (ref 135–145)

## 2014-03-08 LAB — HEMOGLOBIN A1C: Hgb A1c MFr Bld: 6.2 % (ref 4.6–6.5)

## 2014-03-08 LAB — TSH: TSH: 13.96 u[IU]/mL — ABNORMAL HIGH (ref 0.35–4.50)

## 2014-03-08 LAB — D-DIMER, QUANTITATIVE (NOT AT ARMC): D DIMER QUANT: 2.02 ug{FEU}/mL — AB (ref 0.00–0.48)

## 2014-03-08 MED ORDER — FENTANYL 25 MCG/HR TD PT72: 25.0000 ug | MEDICATED_PATCH | TRANSDERMAL | Status: DC

## 2014-03-08 MED ORDER — LEVOTHYROXINE SODIUM 200 MCG PO TABS: 200.0000 ug | ORAL_TABLET | Freq: Every day | ORAL | Status: DC

## 2014-03-08 MED ORDER — OXYCODONE HCL 5 MG PO TABS: 5.0000 mg | ORAL_TABLET | ORAL | Status: DC | PRN

## 2014-03-08 MED ORDER — MELOXICAM 7.5 MG PO TABS: 7.5000 mg | ORAL_TABLET | Freq: Every day | ORAL | Status: DC

## 2014-03-08 MED ORDER — FUROSEMIDE 40 MG PO TABS: 40.0000 mg | ORAL_TABLET | Freq: Every day | ORAL | Status: DC

## 2014-03-08 MED ORDER — METFORMIN HCL 500 MG PO TABS: 500.0000 mg | ORAL_TABLET | Freq: Every day | ORAL | Status: DC

## 2014-03-08 NOTE — Progress Notes (Signed)
Subjective:    Patient ID: Christy Harrell, female    DOB: Nov 22, 1940, 73 y.o.   MRN: [id number]  Cough This is a recurrent problem. The current episode started more than 1 month ago. The problem has been unchanged. The problem occurs every few hours. The cough is non-productive. Associated symptoms include chest pain (aching pain over left chest wall and into her left shoulder), nasal congestion and postnasal drip. Pertinent negatives include no chills, ear congestion, ear pain, fever, headaches, heartburn, hemoptysis, myalgias, rash, rhinorrhea, sore throat, shortness of breath, sweats, weight loss or wheezing. Nothing aggravates the symptoms. She has tried nothing for the symptoms. The treatment provided no relief. Her past medical history is significant for environmental allergies. There is no history of asthma, bronchiectasis, bronchitis, COPD, emphysema or pneumonia.      Review of Systems  Constitutional: Negative.  Negative for fever, chills, weight loss, diaphoresis, activity change, appetite change, fatigue and unexpected weight change.  HENT: Positive for postnasal drip. Negative for congestion, ear pain, facial swelling, rhinorrhea, sinus pressure and sore throat.   Eyes: Negative.   Respiratory: Positive for cough. Negative for hemoptysis, shortness of breath and wheezing.   Cardiovascular: Positive for chest pain (aching pain over left chest wall and into her left shoulder). Negative for palpitations and leg swelling.  Gastrointestinal: Negative.  Negative for heartburn, nausea, vomiting, abdominal pain, diarrhea, constipation and blood in stool.  Endocrine: Negative.   Genitourinary: Negative.   Musculoskeletal: Positive for arthralgias and back pain. Negative for gait problem, joint swelling, myalgias, neck pain and neck stiffness.  Skin: Negative.  Negative for rash.  Allergic/Immunologic: Positive for environmental allergies.  Neurological: Negative.  Negative for  headaches.  Hematological: Negative.  Negative for adenopathy. Does not bruise/bleed easily.  Psychiatric/Behavioral: Negative.        Objective:   Physical Exam  Vitals reviewed. Constitutional: She is oriented to person, place, and time. She appears well-developed and well-nourished. No distress.  HENT:  Head: Normocephalic and atraumatic.  Mouth/Throat: Oropharynx is clear and moist. No oropharyngeal exudate.  Eyes: Conjunctivae are normal. Right eye exhibits no discharge. Left eye exhibits no discharge. No scleral icterus.  Neck: Normal range of motion. Neck supple. No JVD present. No tracheal deviation present. No thyromegaly present.  Cardiovascular: Normal rate, regular rhythm, S1 normal, S2 normal, normal heart sounds and intact distal pulses.  Exam reveals no gallop, no distant heart sounds and no friction rub.   No murmur heard. Pulses:      Carotid pulses are 1+ on the right side, and 1+ on the left side.      Radial pulses are 1+ on the right side, and 1+ on the left side.       Femoral pulses are 1+ on the right side, and 1+ on the left side.      Popliteal pulses are 1+ on the right side, and 1+ on the left side.       Dorsalis pedis pulses are 1+ on the right side, and 1+ on the left side.       Posterior tibial pulses are 1+ on the right side, and 1+ on the left side.  Pulmonary/Chest: Effort normal and breath sounds normal. No accessory muscle usage or stridor. Not tachypneic. No respiratory distress. She has no decreased breath sounds. She has no wheezes. She has no rhonchi. She has no rales. Chest wall is not dull to percussion. She exhibits no mass, no tenderness, no laceration, no crepitus, no edema,  no deformity, no swelling and no retraction. Right breast exhibits no inverted nipple, no mass, no nipple discharge, no skin change and no tenderness. Left breast exhibits no inverted nipple, no mass, no nipple discharge, no skin change and no tenderness. Breasts are  symmetrical.  Abdominal: Soft. Bowel sounds are normal. She exhibits no distension and no mass. There is no tenderness. There is no rebound and no guarding.  Musculoskeletal: Normal range of motion. She exhibits no edema and no tenderness.  Lymphadenopathy:    She has no cervical adenopathy.  Neurological: She is oriented to person, place, and time.  Skin: Skin is warm and dry. No rash noted. She is not diaphoretic. No erythema. No pallor.  Psychiatric: She has a normal mood and affect. Her behavior is normal. Judgment and thought content normal.     Lab Results  Component Value Date   WBC 6.0 02/12/2014   HGB 11.5* 02/12/2014   HCT 35.4 02/12/2014   PLT 260 02/12/2014   GLUCOSE 100 02/12/2014   CHOL 217* 03/05/2014   TRIG 87.0 03/05/2014   HDL 66.80 03/05/2014   LDLDIRECT 160.0 03/16/2013   LDLCALC 133* 03/05/2014   ALT 11 03/05/2014   AST 16 02/12/2014   NA 140 02/12/2014   K 4.0 02/12/2014   CL 101 11/27/2013   CREATININE 0.8 02/12/2014   BUN 14.2 02/12/2014   CO2 26 02/12/2014   TSH 2.97 11/27/2013   HGBA1C 6.0 11/27/2013   MICROALBUR 0.4 03/16/2013       Assessment & Plan:

## 2014-03-08 NOTE — Progress Notes (Signed)
Pre visit review using our clinic review tool, if applicable. No additional management support is needed unless otherwise documented below in the visit note. 

## 2014-03-08 NOTE — Patient Instructions (Signed)

## 2014-03-08 NOTE — Telephone Encounter (Signed)
D-dimer 2.02

## 2014-03-09 ENCOUNTER — Encounter: Payer: Self-pay | Admitting: Internal Medicine

## 2014-03-09 NOTE — Assessment & Plan Note (Signed)
She will cont the current meds for pain relief 

## 2014-03-09 NOTE — Assessment & Plan Note (Signed)
CT angio ordered

## 2014-03-09 NOTE — Telephone Encounter (Signed)
FYI

## 2014-03-09 NOTE — Telephone Encounter (Signed)
Patient was offered a walk-in appointment @ [facility] today for her CT Angio Chest. Patient declined to go today stating she did not have transportation. Patient is requesting an appointment on Tuesday or Wednesday (8/4 or 8/5).

## 2014-03-09 NOTE — Assessment & Plan Note (Signed)
Her blood sugars are well controlled 

## 2014-03-09 NOTE — Assessment & Plan Note (Signed)
Her TSH is elevated so I have increased her dose of synthroid

## 2014-03-09 NOTE — Assessment & Plan Note (Signed)
She has had a thorough cardiac work-up with normal results Her EKG is unchanged and her CXR is normal but her d-dimer is slightly elevated so I have ordered a CT angio to look for PE She will cont the current meds for pain relief

## 2014-03-09 NOTE — Assessment & Plan Note (Signed)
This is related to her allergies

## 2014-03-12 ENCOUNTER — Ambulatory Visit (HOSPITAL_BASED_OUTPATIENT_CLINIC_OR_DEPARTMENT_OTHER): Payer: Medicare Other | Admitting: Oncology

## 2014-03-12 ENCOUNTER — Other Ambulatory Visit (HOSPITAL_BASED_OUTPATIENT_CLINIC_OR_DEPARTMENT_OTHER): Payer: Medicare Other

## 2014-03-12 ENCOUNTER — Ambulatory Visit (HOSPITAL_BASED_OUTPATIENT_CLINIC_OR_DEPARTMENT_OTHER): Payer: Medicare Other

## 2014-03-12 ENCOUNTER — Ambulatory Visit: Payer: Medicare Other

## 2014-03-12 VITALS — BP 117/48 | HR 67 | Temp 98.6°F | Resp 18 | Ht 60.0 in | Wt 206.0 lb

## 2014-03-12 DIAGNOSIS — I700 Atherosclerosis of aorta: Secondary | ICD-10-CM

## 2014-03-12 DIAGNOSIS — C50519 Malignant neoplasm of lower-outer quadrant of unspecified female breast: Secondary | ICD-10-CM

## 2014-03-12 DIAGNOSIS — C50919 Malignant neoplasm of unspecified site of unspecified female breast: Secondary | ICD-10-CM

## 2014-03-12 DIAGNOSIS — C7951 Secondary malignant neoplasm of bone: Secondary | ICD-10-CM

## 2014-03-12 DIAGNOSIS — M810 Age-related osteoporosis without current pathological fracture: Secondary | ICD-10-CM

## 2014-03-12 DIAGNOSIS — C50512 Malignant neoplasm of lower-outer quadrant of left female breast: Secondary | ICD-10-CM

## 2014-03-12 DIAGNOSIS — I2510 Atherosclerotic heart disease of native coronary artery without angina pectoris: Secondary | ICD-10-CM

## 2014-03-12 DIAGNOSIS — M899 Disorder of bone, unspecified: Secondary | ICD-10-CM

## 2014-03-12 DIAGNOSIS — C7952 Secondary malignant neoplasm of bone marrow: Secondary | ICD-10-CM

## 2014-03-12 DIAGNOSIS — Z5111 Encounter for antineoplastic chemotherapy: Secondary | ICD-10-CM

## 2014-03-12 DIAGNOSIS — Z170 Estrogen receptor positive status [ER+]: Secondary | ICD-10-CM

## 2014-03-12 DIAGNOSIS — I890 Lymphedema, not elsewhere classified: Secondary | ICD-10-CM

## 2014-03-12 LAB — CBC WITH DIFFERENTIAL/PLATELET
BASO%: 0.7 % (ref 0.0–2.0)
Basophils Absolute: 0 10*3/uL (ref 0.0–0.1)
EOS%: 2.6 % (ref 0.0–7.0)
Eosinophils Absolute: 0.2 10*3/uL (ref 0.0–0.5)
HCT: 35.3 % (ref 34.8–46.6)
HEMOGLOBIN: 11.5 g/dL — AB (ref 11.6–15.9)
LYMPH#: 1.7 10*3/uL (ref 0.9–3.3)
LYMPH%: 24.6 % (ref 14.0–49.7)
MCH: 28.9 pg (ref 25.1–34.0)
MCHC: 32.6 g/dL (ref 31.5–36.0)
MCV: 88.6 fL (ref 79.5–101.0)
MONO#: 0.5 10*3/uL (ref 0.1–0.9)
MONO%: 7.7 % (ref 0.0–14.0)
NEUT#: 4.3 10*3/uL (ref 1.5–6.5)
NEUT%: 64.4 % (ref 38.4–76.8)
Platelets: 323 10*3/uL (ref 145–400)
RBC: 3.98 10*6/uL (ref 3.70–5.45)
RDW: 15.1 % — AB (ref 11.2–14.5)
WBC: 6.7 10*3/uL (ref 3.9–10.3)

## 2014-03-12 LAB — COMPREHENSIVE METABOLIC PANEL (CC13)
ALBUMIN: 3.1 g/dL — AB (ref 3.5–5.0)
ALT: 10 U/L (ref 0–55)
AST: 18 U/L (ref 5–34)
Alkaline Phosphatase: 68 U/L (ref 40–150)
Anion Gap: 6 mEq/L (ref 3–11)
BUN: 11.6 mg/dL (ref 7.0–26.0)
CALCIUM: 9.6 mg/dL (ref 8.4–10.4)
CHLORIDE: 107 meq/L (ref 98–109)
CO2: 29 mEq/L (ref 22–29)
Creatinine: 0.8 mg/dL (ref 0.6–1.1)
Glucose: 108 mg/dl (ref 70–140)
POTASSIUM: 3.8 meq/L (ref 3.5–5.1)
Sodium: 142 mEq/L (ref 136–145)
TOTAL PROTEIN: 7.2 g/dL (ref 6.4–8.3)
Total Bilirubin: 0.35 mg/dL (ref 0.20–1.20)

## 2014-03-12 MED ORDER — DENOSUMAB 120 MG/1.7ML ~~LOC~~ SOLN
120.0000 mg | Freq: Once | SUBCUTANEOUS | Status: AC
  Administered 2014-03-12: 120 mg via SUBCUTANEOUS
  Filled 2014-03-12: qty 1.7

## 2014-03-12 MED ORDER — LORAZEPAM 0.5 MG PO TABS: ORAL_TABLET | ORAL | Status: DC

## 2014-03-12 MED ORDER — PREDNISONE 5 MG PO TABS
ORAL_TABLET | ORAL | Status: DC
Start: 2014-03-12 — End: 2014-08-24

## 2014-03-12 MED ORDER — FULVESTRANT 250 MG/5ML IM SOLN
500.0000 mg | Freq: Once | INTRAMUSCULAR | Status: AC
  Administered 2014-03-12: 500 mg via INTRAMUSCULAR
  Filled 2014-03-12: qty 10

## 2014-03-12 NOTE — Patient Instructions (Signed)
Denosumab injection What is this medicine? DENOSUMAB (den oh sue mab) slows bone breakdown. Prolia is used to treat osteoporosis in women after menopause and in men. Xgeva is used to prevent bone fractures and other bone problems caused by cancer bone metastases. Xgeva is also used to treat giant cell tumor of the bone. This medicine may be used for other purposes; ask your health care provider or pharmacist if you have questions. COMMON BRAND NAME(S): Prolia, XGEVA What should I tell my health care provider before I take this medicine? They need to know if you have any of these conditions: -dental disease -eczema -infection or history of infections -kidney disease or on dialysis -low blood calcium or vitamin D -malabsorption syndrome -scheduled to have surgery or tooth extraction -taking medicine that contains denosumab -thyroid or parathyroid disease -an unusual reaction to denosumab, other medicines, foods, dyes, or preservatives -pregnant or trying to get pregnant -breast-feeding How should I use this medicine? This medicine is for injection under the skin. It is given by a health care professional in a hospital or clinic setting. If you are getting Prolia, a special MedGuide will be given to you by the pharmacist with each prescription and refill. Be sure to read this information carefully each time. For Prolia, talk to your pediatrician regarding the use of this medicine in children. Special care may be needed. For Xgeva, talk to your pediatrician regarding the use of this medicine in children. While this drug may be prescribed for children as young as 13 years for selected conditions, precautions do apply. Overdosage: If you think you've taken too much of this medicine contact a poison control center or emergency room at once. Overdosage: If you think you have taken too much of this medicine contact a poison control center or emergency room at once. NOTE: This medicine is only for  you. Do not share this medicine with others. What if I miss a dose? It is important not to miss your dose. Call your doctor or health care professional if you are unable to keep an appointment. What may interact with this medicine? Do not take this medicine with any of the following medications: -other medicines containing denosumab This medicine may also interact with the following medications: -medicines that suppress the immune system -medicines that treat cancer -steroid medicines like prednisone or cortisone This list may not describe all possible interactions. Give your health care provider a list of all the medicines, herbs, non-prescription drugs, or dietary supplements you use. Also tell them if you smoke, drink alcohol, or use illegal drugs. Some items may interact with your medicine. What should I watch for while using this medicine? Visit your doctor or health care professional for regular checks on your progress. Your doctor or health care professional may order blood tests and other tests to see how you are doing. Call your doctor or health care professional if you get a cold or other infection while receiving this medicine. Do not treat yourself. This medicine may decrease your body's ability to fight infection. You should make sure you get enough calcium and vitamin D while you are taking this medicine, unless your doctor tells you not to. Discuss the foods you eat and the vitamins you take with your health care professional. See your dentist regularly. Brush and floss your teeth as directed. Before you have any dental work done, tell your dentist you are receiving this medicine. Do not become pregnant while taking this medicine or for 5 months after stopping   it. Women should inform their doctor if they wish to become pregnant or think they might be pregnant. There is a potential for serious side effects to an unborn child. Talk to your health care professional or pharmacist for more  information. What side effects may I notice from receiving this medicine? Side effects that you should report to your doctor or health care professional as soon as possible: -allergic reactions like skin rash, itching or hives, swelling of the face, lips, or tongue -breathing problems -chest pain -fast, irregular heartbeat -feeling faint or lightheaded, falls -fever, chills, or any other sign of infection -muscle spasms, tightening, or twitches -numbness or tingling -skin blisters or bumps, or is dry, peels, or red -slow healing or unexplained pain in the mouth or jaw -unusual bleeding or bruising Side effects that usually do not require medical attention (Report these to your doctor or health care professional if they continue or are bothersome.): -muscle pain -stomach upset, gas This list may not describe all possible side effects. Call your doctor for medical advice about side effects. You may report side effects to FDA at 1-800-FDA-1088. Where should I keep my medicine? This medicine is only given in a clinic, doctor's office, or other health care setting and will not be stored at home. NOTE: This sheet is a summary. It may not cover all possible information. If you have questions about this medicine, talk to your doctor, pharmacist, or health care provider.  2015, Elsevier/Gold Standard. (2012-01-25 12:37:47)  

## 2014-03-12 NOTE — Progress Notes (Signed)
ID: Christy Harrell   DOB: Mar 03, 1941  MR#: [id number]  JME#:[id number]  PCP: [name], MD GYN:  SU:  OTHER MD:  Alysia Penna, Frederik Pear, Faustino Congress  CC: Breast cancer stage IV, follow up TREATMENT: on fulvestrant, letrozole and zolendronate  BREAST CANCER HISTORY: The patient had routine screening mammography 10/16/2010 showing a lobulated mass in the right subareolar region measuring up to 5.5 cm. The Left breast showed some central microcalcifications. Biopsy of the Right breast mass on 10/28/2010 showed an invasive ductal carcinoma, grade 2, estrogen receptor positive (Allred score 8), progesterone receptor positive (Allred score 5), with an equivocal HER-2. The Left breast area of microcalcifications was biopsied at the same time, and was read as suspicious for DCIS.  On 11/17/2010 the patient underwent Right lumpectomy and axillary lymph node dissection for what proved to be a 3 cm invasive ductal carcinoma, grade 2, with some papillary and mucinous features. One of 16 lymph nodes was involved. FISH showed no HER-2 amplification. Left breast biopsy was benign.  The patient had an Oncotype sent, with a score of 27, predicting a risk of distant recurrence after 5 years of tamoxifen in the 18% range. With this intermediate result, the decision was made not to proceed with chemotherapy, since the patient is the sole caregiver to her very elderly mother. Instead the patient proceeded to radiation treatment which was completed 03/06/2011. She started anastrozole at that point. Her subsequent history is as detailed below   INTERVAL HISTORY: Christy Harrell returns today for followup of her metastatic breast carcinoma. Since her last visit here her mom turned 100. She was featured in the paper and of course the patient is very proud of her. The patient herself is tolerating her treatment without major complications. She does have hot flashes from the  letrozole. Vaginal dryness is not a major concern. She tolerates the fulvestrant would discomfort with the shots as the only side effect. The zolendronate does not cause her any problems. In addition she had a cardiac workup under Dr. Radford Pax which according to the patient was negative.  REVIEW OF SYSTEMS: She has no complaints related to the cancer as far as I can see. She is somewhat fatigued. She feels short of breath with activity and keeps a little bit of a dry cough at times. She has back pain which is really not more intense or persistent than before. She continues to have hot flashes as noted. She continues to have chronic right upper extremity lymphedema. This is not new however.  PAST MEDICAL HISTORY: Past Medical History  Diagnosis Date  . Cancer     right breast  . Club foot   . Arthritis   . Thyroid disease   . Hyperlipidemia   . Diabetes mellitus   . Osteoporosis   . Hypertension   . GERD (gastroesophageal reflux disease)     watches diet  . Neuromuscular disorder     lt arm numb sometimes  . Wears glasses   . 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 History of radiation therapy 02/22/13- 03/13/13    left proximal humerus 3500 cGy 14 sessions  . Lymphedema     right arm  . Metastasis from malignant tumor of breast     liver    PAST SURGICAL HISTORY: Past Surgical History  Procedure Laterality Date  . Cesarean section    . Thyroidectomy  2002  . Foot surgery  as a child for club foot  . Breast surgery  2012    rt lump-16 nodes-in [place]  . Cesarean section    . Colonoscopy    . Dilation and curettage of uterus    . Humerus im nail Left 12/27/2012    Procedure: INTRAMEDULLARY (IM) NAIL HUMERAL LEFT PATHOLOGIC FRACTURE;  Surgeon: Nita Sells, MD;  Location: [facility];  Service: Orthopedics;  Laterality: Left;    FAMILY HISTORY Family History  Problem Relation Age of Onset  . Cancer Neg Hx   . Arthritis Neg Hx    . Heart disease Neg Hx   . Hyperlipidemia Neg Hx   . Hypertension Neg Hx   . Dementia Mother   . Hearing loss Mother    The patient's father died from unknown causes. The patient's mother is alive at age 61. The patient was a single child. There is no history of breast or ovarian cancer in the family to her knowledge.  GYNECOLOGIC HISTORY: Menarche age 35, menopause in her early 44s. The patient is GX P1, first pregnancy to term age 3. She did not take hormone replacement  SOCIAL HISTORY: The patient grew up in [place], attended [employer], then moved to [place] where she lived about 40 years. 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 Her mother, Christy Harrell, 99, lives with the patient about participates in progress for the elderly which give this alone you a little bit of time to herself. This doesn't mean that she cannot have early morning appointments.. The patient's daughter [name], 52, is disabled secondary to pulmonary hypertension. She can be reached at [phone]. The patient has 2 grandchildren, [name] who works at a Scientist, physiological business and is 73 years old, in general, 69, completing high school.   ADVANCED DIRECTIVES:  HEALTH MAINTENANCE: History  Substance Use Topics  . Smoking status: Former Smoker    Quit date: 12/23/1974  . Smokeless tobacco: Never Used  . Alcohol Use: No     Colonoscopy:  PAP:    Bone density:  2012, on ibandronate chronically  Lipid panel: UTD  Allergies  Allergen Reactions  . Gadolinium Derivatives Other (See Comments)    Pt began having chest numbness/tingling , stated her heart felt funny, had to take her to ED for EKG per RN   CAP    Current Outpatient Prescriptions  Medication Sig Dispense Refill  . aspirin EC 81 MG tablet Take 1 tablet (81 mg total) by mouth daily.      Marland Kitchen atorvastatin (LIPITOR) 40 MG tablet Take 1 tablet (40 mg total) by mouth daily.  30 tablet  11  . diclofenac  sodium (VOLTAREN) 1 % GEL Apply 2 g topically 4 (four) times daily.  2 Tube  2  . Dietary Management Product (VASCULERA) TABS Take 1 capsule by mouth daily.  30 tablet  11  . docusate sodium (COLACE) 100 MG capsule Take 100 mg by mouth 2 (two) times daily as needed for constipation.      Marland Kitchen ezetimibe (ZETIA) 10 MG tablet Take 1 tablet (10 mg total) by mouth daily.  90 tablet  3  . fentaNYL (DURAGESIC - DOSED MCG/HR) 25 MCG/HR patch Place 1 patch (25 mcg total) onto the skin every 3 (three) days. Fill on or after 05/09/14  10 patch  0  . furosemide (LASIX) 40 MG tablet Take 1 tablet (40 mg total) by mouth daily.  30 tablet  6  .  letrozole (FEMARA) 2.5 MG tablet Take 1 tablet (2.5 mg total) by mouth daily.  30 tablet  12  . levothyroxine (SYNTHROID) 200 MCG tablet Take 1 tablet (200 mcg total) by mouth daily before breakfast.  90 tablet  1  . lubiprostone (AMITIZA) 24 MCG capsule Take 1 capsule (24 mcg total) by mouth 2 (two) times daily with a meal.  180 capsule  1  . meloxicam (MOBIC) 7.5 MG tablet Take 1 tablet (7.5 mg total) by mouth daily.  20 tablet  0  . metFORMIN (GLUCOPHAGE) 500 MG tablet Take 1 tablet (500 mg total) by mouth daily.  90 tablet  3  . oxyCODONE (OXY IR/ROXICODONE) 5 MG immediate release tablet Take 1 tablet (5 mg total) by mouth every 4 (four) hours as needed for severe pain. Take 1-2 tablets every 4-6 hours as needed for pain - fill on or after 05/09/14  100 tablet  0  . Polyethyl Glycol-Propyl Glycol (SYSTANE ULTRA OP) Place 1 drop into both eyes daily.      . potassium chloride SA (K-DUR,KLOR-CON) 20 MEQ tablet Take 1 tablet (20 mEq total) by mouth daily.  90 tablet  1  . pregabalin (LYRICA) 75 MG capsule Take 1 capsule (75 mg total) by mouth 2 (two) times daily. Not taking  60 capsule  5  . zolendronic acid (ZOMETA) 4 MG/5ML injection Inject 4 mg into the vein every 30 (thirty) days.       No current facility-administered medications for this visit.   PHYSICAL EXAM:  Elderly African American woman in no acute distress Filed Vitals:   03/12/14 1149  BP: 117/48  Pulse: 67  Temp: 98.6 F (37 C)  Resp: 18   Body mass index is 40.23 kg/(m^2).    ECOG FS: 1 Filed Weights   03/12/14 1149  Weight: 206 lb (93.441 kg)    Sclerae are anicteric; pupils round and equal Oropharynx clear and moist-- dentition in poor repair No cervical or supraclavicular adenopathy Lungs no rales or rhonchi Heart regular rate and rhythm Abd soft, obese,  nontender, positive bowel sounds MSK no focal spinal tenderness, chronic grade 2 right upper extremity lymphedema Neuro: nonfocal, well oriented,  positive affect Breasts: The right breast is status post prior lumpectomy. There are changes secondary to the surgery and radiation, but no evidence of disease recurrence. The right axilla is benign. The left breast is unremarkable   LAB RESULTS: Lab Results  Component Value Date   WBC 6.7 03/12/2014   NEUTROABS 4.3 03/12/2014   HGB 11.5* 03/12/2014   HCT 35.3 03/12/2014   MCV 88.6 03/12/2014   PLT 323 03/12/2014      Chemistry      Component Value Date/Time   NA 139 03/08/2014 1223   NA 140 02/12/2014 1102   K 3.7 03/08/2014 1223   K 4.0 02/12/2014 1102   CL 105 03/08/2014 1223   CL 105 01/23/2013 1337   CO2 30 03/08/2014 1223   CO2 26 02/12/2014 1102   BUN 12 03/08/2014 1223   BUN 14.2 02/12/2014 1102   CREATININE 0.8 03/08/2014 1223   CREATININE 0.8 02/12/2014 1102      Component Value Date/Time   CALCIUM 9.6 03/08/2014 1223   CALCIUM 9.7 02/12/2014 1102   ALKPHOS 58 02/12/2014 1102   ALKPHOS 58 11/27/2013 1404   AST 16 02/12/2014 1102   AST 17 11/27/2013 1404   ALT 11 03/05/2014 1108   ALT 12 02/12/2014 1102   BILITOT 0.26 02/12/2014 1102  BILITOT 0.6 11/27/2013 1404       Lab Results  Component Value Date   LABCA2 24 11/19/2011    STUDIES: Dg Chest 2 View  03/08/2014   CLINICAL DATA:  Cough and chest pain with history of diabetes  EXAM: CHEST  2 VIEW  COMPARISON:  PA and lateral  chest of September 13, 2013 and CT scan of the chest of November 29, 2013.  FINDINGS: The lungs are well-expanded. There is no focal infiltrate. No pulmonary parenchymal nodules are demonstrated. There is degenerative disc change at multiple thoracic levels. No lytic or blastic bony lesion is demonstrated. The heart and pulmonary vascularity are normal. There is mild tortuosity of the descending thoracic aorta.  IMPRESSION: There is no acute cardiopulmonary abnormality. Specifically there are no findings to suggest pneumonia. There is no evidence of metastatic disease to the lungs.   Electronically Signed   By: David  Martinique   On: 03/08/2014 14:56    ASSESSMENT: 73 y.o.  [place] woman with stage IV [bone only] breast cancer in a  (1)  status post right lumpectomy and axillary lymph node dissection 11/17/2010 for a pT2 pN1, stage IIB invasive ductal carcinoma, grade 2, estrogen and progesterone receptor positive, HER-2 negative,   (2) Oncotype DX recurrence score of 27 predicting a risk of distant recurrence of 18% with 5 years of tamoxifen (intermediate score);   (3)  status post radiation completed July of 2012,   (4) started anastrozole July 2012, discontinued June 2014 with evidence of metastatic spread to bone  (5) Chronic lymphedema in the right upper extremity.  (6) pathologic fracture of the left humerus along apparent lytic lesion, with no other bone lesions per bone scan 12/12/2012  (7) on 12/27/2012 underwent #1 intramedullary nail left pathologic proximal humerus fracture  #2 left shoulder rotator cuff repair  #3 left shoulder open bone biopsy with pathology showing metastatic breast adenocarcinoma, estrogen receptor 100% positive, progesterone receptor and HER-2 negative, with an MIB-1 of 26%. PET scan 01/13/2013 showed no other areas of disease and  (8) status post 35 Gy to the left proximal humerus completed 03/13/2013(  (9) fulvestrant, first injections on 01/23/2013  (10)  zolendronic acid given monthly, first dose 01/23/2013; changed to denosumab as of August 2015 because of access problems  (11) multiple liver lesions noted on scans April 2015  (12) letrozole added May 2015   PLAN:  Christy Harrell is tolerating her treatments remarkably well. Clinically she seems very stable. I was going to obtain some staging studies now, but Dr. Ronnald Ramp have set her up for a CT angiogram tomorrow and I think that will be sufficient. Before she returns to see me in September she is scheduled for repeat MRI of the liver. That will help [place] with her measurable disease.  She had what does not quite sound like a reaction to the contrast on the MRI last time. We will premed her with steroids and lorazepam before that procedure.  She has significant problems with axis. We discussed placing a port orthopedic. But we are going to do for now is changed the zolendronate to denosumab. This obviously only requires a shot so it will alleviate that issue at least for now. If we do go to chemotherapy in the future we will consider going back to zolendronate.  Dreya has a good understanding of the overall plan. She agrees with it. She knows the goal of treatment in her case is control. She will call with any problems that may develop  before the next visit.     MAGRINAT,GUSTAV C    03/12/2014

## 2014-03-12 NOTE — Addendum Note (Signed)
Addended by: Chauncey Cruel on: 03/12/2014 07:18 PM   Modules accepted: Orders

## 2014-03-14 ENCOUNTER — Ambulatory Visit (INDEPENDENT_AMBULATORY_CARE_PROVIDER_SITE_OTHER)
Admission: RE | Admit: 2014-03-14 | Discharge: 2014-03-14 | Disposition: A | Discharge status: Home / Self Care | Payer: Medicare Other | Source: Ambulatory Visit | Attending: Internal Medicine | Admitting: Internal Medicine

## 2014-03-14 DIAGNOSIS — R079 Chest pain, unspecified: Secondary | ICD-10-CM

## 2014-03-14 DIAGNOSIS — R05 Cough: Secondary | ICD-10-CM

## 2014-03-14 DIAGNOSIS — R791 Abnormal coagulation profile: Secondary | ICD-10-CM

## 2014-03-14 DIAGNOSIS — R059 Cough, unspecified: Secondary | ICD-10-CM

## 2014-03-14 DIAGNOSIS — R7989 Other specified abnormal findings of blood chemistry: Secondary | ICD-10-CM

## 2014-03-14 MED ORDER — IOHEXOL 350 MG/ML SOLN
80.0000 mL | Freq: Once | INTRAVENOUS | Status: AC | PRN
Start: 2014-03-14 — End: 2014-03-14

## 2014-03-15 ENCOUNTER — Telehealth: Payer: Self-pay | Admitting: Oncology

## 2014-03-15 NOTE — Telephone Encounter (Signed)
S/w pt gave appt 04/09/14. Pt had questions about whether or not this inj goes in her arm or thigh. I apologized and advised pt that I am unaware and unable to answer herquestion.

## 2014-03-16 ENCOUNTER — Telehealth: Payer: Self-pay | Admitting: *Deleted

## 2014-03-16 NOTE — Telephone Encounter (Signed)
Per Dr. [name], patient needs to take Ativan and Prednisone before the MRI on 04/12/14. This RN tried calling the home phone but there is no answering machine to leave a message. Also, cell phone is not set up to receive voice messages. The prescriptions are in the injection room book.

## 2014-03-27 ENCOUNTER — Encounter: Payer: Self-pay | Admitting: Internal Medicine

## 2014-03-27 ENCOUNTER — Ambulatory Visit (INDEPENDENT_AMBULATORY_CARE_PROVIDER_SITE_OTHER): Payer: Medicare Other | Admitting: Internal Medicine

## 2014-03-27 VITALS — BP 90/60 | HR 64 | Ht 60.0 in | Wt 208.8 lb

## 2014-03-27 DIAGNOSIS — K5900 Constipation, unspecified: Secondary | ICD-10-CM

## 2014-03-27 MED ORDER — POLYETHYLENE GLYCOL 3350 17 GM/SCOOP PO POWD: 1.0000 | Freq: Every day | ORAL | Status: AC

## 2014-03-27 NOTE — Patient Instructions (Signed)
You may purchase Glycolax (Miralax) over the counter.  Take one dose daily.  Increase or decrease as needed

## 2014-03-27 NOTE — Progress Notes (Signed)
HISTORY OF PRESENT ILLNESS:  Christy Harrell is a 73 y.o. female  with metastatic (to liver and bone) breast cancer. She is sent today by Dr. Ronnald Ramp for evaluation of constipation. The patient tells me that she has had problems with constipation over the past year. She is on multiple medications including fentanyl patch and oxycodone. She recently initiated Colace which has helped. Amitiza is listed as a medication, though she's not sure if she is using this. She tells me that she has had multiple colonoscopies in [place], most recently 2012. No significant pathology reported. Patient denies nausea, vomiting, or abdominal pain. She does have a history of occasional acid reflux.  REVIEW OF SYSTEMS:  All non-GI ROS negative except for shoulder pain  Past Medical History  Diagnosis Date  . Cancer     right breast  . Club foot   . Arthritis   . Thyroid disease   . Hyperlipidemia   . Diabetes mellitus   . Osteoporosis   . Hypertension   . GERD (gastroesophageal reflux disease)     watches diet  . Neuromuscular disorder     lt arm numb sometimes  . Wears glasses   . 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 History of radiation therapy 02/22/13- 03/13/13    left proximal humerus 3500 cGy 14 sessions  . Lymphedema     right arm  . 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 Cesarean section    . Thyroidectomy  2002  . Foot surgery      as a child for club foot  . Breast surgery  2012    rt lump-16 nodes-in [place]  . Cesarean section    . Colonoscopy    . Dilation and curettage of uterus    . Humerus im nail Left 12/27/2012    Procedure: INTRAMEDULLARY (IM) NAIL HUMERAL LEFT PATHOLOGIC FRACTURE;  Surgeon: Nita Sells, MD;  Location: [facility];  Service: Orthopedics;  Laterality: Left;    Social History Christy Harrell  reports that she quit smoking about 39 years  ago. She has never used smokeless tobacco. She reports that she does not drink alcohol or use illicit drugs.  family history includes Dementia in her mother; Hearing loss in her mother. There is no history of Cancer, Arthritis, Heart disease, Hyperlipidemia, or Hypertension.  Allergies  Allergen Reactions  . Gadolinium Derivatives Other (See Comments)    Pt began having chest numbness/tingling , stated her heart felt funny, had to take her to ED for EKG per RN   CAP       PHYSICAL EXAMINATION: Vital signs: BP 90/60  Pulse 64  Ht 5' (1.524 m)  Wt 208 lb 12.8 oz (94.711 kg)  BMI 40.78 kg/m2 General: Well-developed, obese, well-nourished, no acute distress HEENT: Sclerae are anicteric, conjunctiva pink. Oral mucosa intact Lungs: Clear Heart: Regular Abdomen: soft, obese, nontender, nondistended, no obvious ascites, no peritoneal signs, normal bowel sounds. No organomegaly. Extremities: Lymphedema right arm Psychiatric: alert and oriented x3. Cooperative   ASSESSMENT:  #1. Constipation secondary to narcotics #2. Metastatic breast cancer   PLAN:  #1. Recommend MiraLax. Titrated to need #2. Followup as needed

## 2014-04-09 ENCOUNTER — Ambulatory Visit (HOSPITAL_BASED_OUTPATIENT_CLINIC_OR_DEPARTMENT_OTHER): Payer: Medicare Other

## 2014-04-09 ENCOUNTER — Other Ambulatory Visit (HOSPITAL_BASED_OUTPATIENT_CLINIC_OR_DEPARTMENT_OTHER): Payer: Medicare Other

## 2014-04-09 VITALS — BP 111/52 | HR 69 | Temp 98.5°F

## 2014-04-09 DIAGNOSIS — M899 Disorder of bone, unspecified: Secondary | ICD-10-CM

## 2014-04-09 DIAGNOSIS — C50519 Malignant neoplasm of lower-outer quadrant of unspecified female breast: Secondary | ICD-10-CM

## 2014-04-09 DIAGNOSIS — C50919 Malignant neoplasm of unspecified site of unspecified female breast: Secondary | ICD-10-CM

## 2014-04-09 DIAGNOSIS — Z5111 Encounter for antineoplastic chemotherapy: Secondary | ICD-10-CM

## 2014-04-09 LAB — COMPREHENSIVE METABOLIC PANEL (CC13)
ALBUMIN: 3.1 g/dL — AB (ref 3.5–5.0)
ALT: 14 U/L (ref 0–55)
ANION GAP: 7 meq/L (ref 3–11)
AST: 20 U/L (ref 5–34)
Alkaline Phosphatase: 68 U/L (ref 40–150)
BUN: 12.4 mg/dL (ref 7.0–26.0)
CALCIUM: 9.6 mg/dL (ref 8.4–10.4)
CHLORIDE: 105 meq/L (ref 98–109)
CO2: 30 meq/L — AB (ref 22–29)
Creatinine: 0.8 mg/dL (ref 0.6–1.1)
Glucose: 114 mg/dl (ref 70–140)
POTASSIUM: 4 meq/L (ref 3.5–5.1)
SODIUM: 142 meq/L (ref 136–145)
TOTAL PROTEIN: 7.5 g/dL (ref 6.4–8.3)
Total Bilirubin: 0.39 mg/dL (ref 0.20–1.20)

## 2014-04-09 LAB — CBC WITH DIFFERENTIAL/PLATELET
BASO%: 0.2 % (ref 0.0–2.0)
Basophils Absolute: 0 10*3/uL (ref 0.0–0.1)
EOS%: 2.8 % (ref 0.0–7.0)
Eosinophils Absolute: 0.2 10*3/uL (ref 0.0–0.5)
HEMATOCRIT: 36.7 % (ref 34.8–46.6)
HGB: 11.6 g/dL (ref 11.6–15.9)
LYMPH#: 1.7 10*3/uL (ref 0.9–3.3)
LYMPH%: 28 % (ref 14.0–49.7)
MCH: 28.7 pg (ref 25.1–34.0)
MCHC: 31.6 g/dL (ref 31.5–36.0)
MCV: 90.8 fL (ref 79.5–101.0)
MONO#: 0.4 10*3/uL (ref 0.1–0.9)
MONO%: 6.5 % (ref 0.0–14.0)
NEUT#: 3.7 10*3/uL (ref 1.5–6.5)
NEUT%: 62.5 % (ref 38.4–76.8)
Platelets: 281 10*3/uL (ref 145–400)
RBC: 4.04 10*6/uL (ref 3.70–5.45)
RDW: 14.7 % — AB (ref 11.2–14.5)
WBC: 6 10*3/uL (ref 3.9–10.3)

## 2014-04-09 MED ORDER — FULVESTRANT 250 MG/5ML IM SOLN
500.0000 mg | Freq: Once | INTRAMUSCULAR | Status: AC
  Administered 2014-04-09: 500 mg via INTRAMUSCULAR
  Filled 2014-04-09: qty 10

## 2014-04-11 ENCOUNTER — Telehealth: Payer: Self-pay | Admitting: Emergency Medicine

## 2014-04-11 ENCOUNTER — Telehealth: Payer: Self-pay | Admitting: Cardiology

## 2014-04-11 NOTE — Telephone Encounter (Signed)
Pt stated she was afraid to take the medication she was supposed to use prior to MRI. I told her she needed to go to PCP in regards to this. Also she stated she was having some extra stress in her life about her mom. Advised her to see pcp today or tomorrow. If they felt she needed to see [place] I would get her in with Dr Radford Pax. Pt agreed to plan.

## 2014-04-11 NOTE — Telephone Encounter (Signed)
Patient calls stating that she "doesn't feel good" and that she is going to cancel the MRI scheduled for 9/03. Patient states she is going to call her PCP for an office visit due to her " not feeling well". Patient states she will reschedule her MRI. No further concerns voiced.

## 2014-04-11 NOTE — Telephone Encounter (Signed)
New problem   Pt is feeling funny around her heart that aches. Pt want to speak to nurse about this matter.

## 2014-04-12 ENCOUNTER — Ambulatory Visit (INDEPENDENT_AMBULATORY_CARE_PROVIDER_SITE_OTHER): Payer: Medicare Other | Admitting: Internal Medicine

## 2014-04-12 ENCOUNTER — Encounter: Payer: Self-pay | Admitting: Internal Medicine

## 2014-04-12 ENCOUNTER — Ambulatory Visit (HOSPITAL_COMMUNITY): Admission: RE | Admit: 2014-04-12 | Payer: Medicare Other | Source: Ambulatory Visit

## 2014-04-12 ENCOUNTER — Ambulatory Visit (INDEPENDENT_AMBULATORY_CARE_PROVIDER_SITE_OTHER)
Admission: RE | Admit: 2014-04-12 | Discharge: 2014-04-12 | Disposition: A | Discharge status: Home / Self Care | Payer: Medicare Other | Source: Ambulatory Visit | Attending: Internal Medicine | Admitting: Internal Medicine

## 2014-04-12 ENCOUNTER — Other Ambulatory Visit (INDEPENDENT_AMBULATORY_CARE_PROVIDER_SITE_OTHER): Payer: Medicare Other

## 2014-04-12 ENCOUNTER — Ambulatory Visit (HOSPITAL_COMMUNITY): Payer: Medicare Other

## 2014-04-12 VITALS — BP 100/68 | HR 74 | Temp 98.6°F | Resp 13 | Wt 207.0 lb

## 2014-04-12 DIAGNOSIS — R0600 Dyspnea, unspecified: Secondary | ICD-10-CM

## 2014-04-12 DIAGNOSIS — R5381 Other malaise: Secondary | ICD-10-CM

## 2014-04-12 DIAGNOSIS — IMO0002 Reserved for concepts with insufficient information to code with codable children: Secondary | ICD-10-CM

## 2014-04-12 DIAGNOSIS — I890 Lymphedema, not elsewhere classified: Secondary | ICD-10-CM

## 2014-04-12 DIAGNOSIS — R0609 Other forms of dyspnea: Secondary | ICD-10-CM

## 2014-04-12 DIAGNOSIS — E039 Hypothyroidism, unspecified: Secondary | ICD-10-CM

## 2014-04-12 DIAGNOSIS — R5383 Other fatigue: Secondary | ICD-10-CM

## 2014-04-12 DIAGNOSIS — R0989 Other specified symptoms and signs involving the circulatory and respiratory systems: Secondary | ICD-10-CM

## 2014-04-12 DIAGNOSIS — R079 Chest pain, unspecified: Secondary | ICD-10-CM

## 2014-04-12 DIAGNOSIS — E8989 Other postprocedural endocrine and metabolic complications and disorders: Secondary | ICD-10-CM

## 2014-04-12 LAB — TSH: TSH: 18.13 u[IU]/mL — ABNORMAL HIGH (ref 0.35–4.50)

## 2014-04-12 NOTE — Progress Notes (Signed)
Pre visit review using our clinic review tool, if applicable. No additional management support is needed unless otherwise documented below in the visit note. 

## 2014-04-12 NOTE — Patient Instructions (Signed)
Your next office appointment will be determined based upon review of your pending labs & x-rays. Those instructions will be transmitted to you through by phone.  Cell Travis

## 2014-04-12 NOTE — Progress Notes (Signed)
   Subjective:    Patient ID: Christy Harrell, female    DOB: 12-Sep-1940, 73 y.o.   MRN: [id number]  HPI   She describes approximately 10 days of intermittent substernal dull pain associated with labored breathing. It can last all day. She has some discomfort in the left inframammary area of her chest as well as the left upper chest. Symptoms are worse with exertion.  Her last episode of chest discomfort was earlier this morning.  She describes feeling hot and noted that her skin is damp.  She is also had some unrelated left lower quadrant aching discomfort. She also has aching in the thighs but not the calves.  She has increased belching.  She had extensive labs 04/09/14. Hemoglobin 11.6/hematocrit 36.7. Albumin was reduced at 3.1.  Her TSH was 13.96 on 03/08/14.   Review of Systems  She denies associated significant dyspepsia; dysphagia; unexplained weight loss; melena; or rectal bleeding  She does have an occasional cough productive of scant sputum. She denies any hemoptysis  There is no pain in the posterior thorax which radiates anteriorly  Ankle edema is a chronic problem.  She denies any paroxysmal nocturnal dyspnea  She also denies syncope.  There is no rash or change in color or temperature of the skin in the area the symptoms.     Objective:   Physical Exam   Positive or pertinent physical findings include: She appears adequately nourished but as per CDC guidelines she has severe obesity. She has extremely poor dentition with caries. There's increased wax in the left otic canal. She has profound lymphedema of the right upper extremity She has dramatic edema of the lower extremities greater in the right lower extremity. There is 1+ pitting edema on the right Right reflex is 0+ the knee; left is 1/2+ Homans sign is negative. She is an extremely poor historian; she tends to attempt to provide a diagnosis rather than describing actual symptoms. She repeatedly  uses terms "kind of" and "I think".  General appearance :adequately nourished; in no distress. Eyes: No conjunctival inflammation or scleral icterus is present. Oral exam: Lips and gums are healthy appearing.There is no oropharyngeal erythema or exudate noted.  Heart:  Normal rate and regular rhythm. S1 and S2 normal without gallop, murmur, click, rub or other extra sounds  Lungs:Chest clear to auscultation; no wheezes, rhonchi,rales ,or rubs present.No increased work of breathing.  Abdomen: Protuberant;bowel sounds normal, soft and non-tender without masses, organomegaly or hernias noted.  No guarding or rebound. No flank tenderness to percussion. Skin:Warm & dry.  Intact without suspicious lesions or rashes ; no jaundice or tenting Lymphatic: No lymphadenopathy is noted about the head, neck, axilla          Assessment & Plan:  #1 chest pain  #2 dyspnea  #3 fatigue in context of uncorrected hypothyroidism  Plan: EKG is virtually uninterpretable due to electrical interference. The only lead which appears essentially normal is lead 3. The electrical interference was interpreted as representing frequent multiform ectopic ventricular beats but none were seen in lead 3. The P wave is variable in lead 3 suggesting ectopic foci.

## 2014-04-13 ENCOUNTER — Ambulatory Visit: Payer: Medicare Other | Admitting: Cardiology

## 2014-04-13 ENCOUNTER — Telehealth: Payer: Self-pay | Admitting: Nurse Practitioner

## 2014-04-13 LAB — TROPONIN I: Troponin I: <0.01 ng/mL (ref <0.06)

## 2014-04-13 LAB — D-DIMER, QUANTITATIVE: D-Dimer, Quant: 2.06 ug/mL-FEU — ABNORMAL HIGH (ref 0.00–0.48)

## 2014-04-13 NOTE — Telephone Encounter (Signed)
per GM to sch pt w/ HF-GM wanted pt on LC sch for the 9/8 sch @ 12:156. LC lunch time. Cld pt to see if sch 9/9 would work better. HF had a time that wold work better for pt

## 2014-04-17 ENCOUNTER — Other Ambulatory Visit: Payer: Medicare Other

## 2014-04-17 ENCOUNTER — Ambulatory Visit: Payer: Medicare Other | Admitting: Oncology

## 2014-04-18 ENCOUNTER — Telehealth: Payer: Self-pay | Admitting: *Deleted

## 2014-04-18 ENCOUNTER — Telehealth: Payer: Self-pay | Admitting: Nurse Practitioner

## 2014-04-18 ENCOUNTER — Ambulatory Visit (HOSPITAL_BASED_OUTPATIENT_CLINIC_OR_DEPARTMENT_OTHER): Payer: Medicare Other | Admitting: Nurse Practitioner

## 2014-04-18 ENCOUNTER — Encounter: Payer: Self-pay | Admitting: Nurse Practitioner

## 2014-04-18 ENCOUNTER — Other Ambulatory Visit (HOSPITAL_BASED_OUTPATIENT_CLINIC_OR_DEPARTMENT_OTHER): Payer: Medicare Other

## 2014-04-18 ENCOUNTER — Other Ambulatory Visit: Payer: Self-pay | Admitting: Oncology

## 2014-04-18 VITALS — BP 148/71 | HR 70 | Temp 98.3°F | Resp 18 | Ht 60.0 in | Wt 210.5 lb

## 2014-04-18 DIAGNOSIS — C773 Secondary and unspecified malignant neoplasm of axilla and upper limb lymph nodes: Secondary | ICD-10-CM

## 2014-04-18 DIAGNOSIS — C50919 Malignant neoplasm of unspecified site of unspecified female breast: Secondary | ICD-10-CM

## 2014-04-18 DIAGNOSIS — C50519 Malignant neoplasm of lower-outer quadrant of unspecified female breast: Secondary | ICD-10-CM

## 2014-04-18 DIAGNOSIS — Z170 Estrogen receptor positive status [ER+]: Secondary | ICD-10-CM

## 2014-04-18 DIAGNOSIS — C7951 Secondary malignant neoplasm of bone: Secondary | ICD-10-CM

## 2014-04-18 DIAGNOSIS — C787 Secondary malignant neoplasm of liver and intrahepatic bile duct: Secondary | ICD-10-CM

## 2014-04-18 DIAGNOSIS — E8989 Other postprocedural endocrine and metabolic complications and disorders: Secondary | ICD-10-CM

## 2014-04-18 DIAGNOSIS — I890 Lymphedema, not elsewhere classified: Secondary | ICD-10-CM

## 2014-04-18 DIAGNOSIS — K5900 Constipation, unspecified: Secondary | ICD-10-CM

## 2014-04-18 DIAGNOSIS — F418 Other specified anxiety disorders: Secondary | ICD-10-CM

## 2014-04-18 DIAGNOSIS — M545 Low back pain: Secondary | ICD-10-CM

## 2014-04-18 DIAGNOSIS — C50512 Malignant neoplasm of lower-outer quadrant of left female breast: Secondary | ICD-10-CM

## 2014-04-18 DIAGNOSIS — C7952 Secondary malignant neoplasm of bone marrow: Secondary | ICD-10-CM

## 2014-04-18 LAB — CBC WITH DIFFERENTIAL/PLATELET
BASO%: 0.9 % (ref 0.0–2.0)
BASOS ABS: 0.1 10*3/uL (ref 0.0–0.1)
EOS ABS: 0.3 10*3/uL (ref 0.0–0.5)
EOS%: 5.2 % (ref 0.0–7.0)
HCT: 35.4 % (ref 34.8–46.6)
HEMOGLOBIN: 11.3 g/dL — AB (ref 11.6–15.9)
LYMPH%: 31.6 % (ref 14.0–49.7)
MCH: 28.8 pg (ref 25.1–34.0)
MCHC: 31.9 g/dL (ref 31.5–36.0)
MCV: 90.1 fL (ref 79.5–101.0)
MONO#: 0.5 10*3/uL (ref 0.1–0.9)
MONO%: 8.2 % (ref 0.0–14.0)
NEUT#: 3.1 10*3/uL (ref 1.5–6.5)
NEUT%: 54.1 % (ref 38.4–76.8)
PLATELETS: 312 10*3/uL (ref 145–400)
RBC: 3.93 10*6/uL (ref 3.70–5.45)
RDW: 15.3 % — ABNORMAL HIGH (ref 11.2–14.5)
WBC: 5.7 10*3/uL (ref 3.9–10.3)
lymph#: 1.8 10*3/uL (ref 0.9–3.3)

## 2014-04-18 LAB — COMPREHENSIVE METABOLIC PANEL (CC13)
ALK PHOS: 59 U/L (ref 40–150)
ALT: 9 U/L (ref 0–55)
AST: 18 U/L (ref 5–34)
Albumin: 3 g/dL — ABNORMAL LOW (ref 3.5–5.0)
Anion Gap: 10 mEq/L (ref 3–11)
BUN: 13 mg/dL (ref 7.0–26.0)
CO2: 25 meq/L (ref 22–29)
Calcium: 9.7 mg/dL (ref 8.4–10.4)
Chloride: 107 mEq/L (ref 98–109)
Creatinine: 0.8 mg/dL (ref 0.6–1.1)
GLUCOSE: 116 mg/dL (ref 70–140)
POTASSIUM: 4.1 meq/L (ref 3.5–5.1)
SODIUM: 142 meq/L (ref 136–145)
Total Bilirubin: 0.23 mg/dL (ref 0.20–1.20)
Total Protein: 7.1 g/dL (ref 6.4–8.3)

## 2014-04-18 MED ORDER — EVEROLIMUS 10 MG PO TABS: 10.0000 mg | ORAL_TABLET | Freq: Every day | ORAL | Status: DC

## 2014-04-18 MED ORDER — EXEMESTANE 25 MG PO TABS: 25.0000 mg | ORAL_TABLET | Freq: Every day | ORAL | Status: DC

## 2014-04-18 NOTE — Telephone Encounter (Signed)
per pof to sch appt w/GM week of 9/21-no appt time avail-sent email to HF to consult w/GNM on a time & date-will call pt once reply

## 2014-04-18 NOTE — Progress Notes (Signed)
ID: Christy Harrell   DOB: Aug 23, 1940  MR#: [id number]  ZJI#:[id number]  PCP: [name], MD GYN:  SU:  OTHER MD:  Alysia Penna, Frederik Pear, Faustino Congress  CC: Breast cancer stage IV, follow up TREATMENT: on fulvestrant, letrozole and denosumab  BREAST CANCER HISTORY: The patient had routine screening mammography 10/16/2010 showing a lobulated mass in the right subareolar region measuring up to 5.5 cm. The Left breast showed some central microcalcifications. Biopsy of the Right breast mass on 10/28/2010 showed an invasive ductal carcinoma, grade 2, estrogen receptor positive (Allred score 8), progesterone receptor positive (Allred score 5), with an equivocal HER-2. The Left breast area of microcalcifications was biopsied at the same time, and was read as suspicious for DCIS.  On 11/17/2010 the patient underwent Right lumpectomy and axillary lymph node dissection for what proved to be a 3 cm invasive ductal carcinoma, grade 2, with some papillary and mucinous features. One of 16 lymph nodes was involved. FISH showed no HER-2 amplification. Left breast biopsy was benign.  The patient had an Oncotype sent, with a score of 27, predicting a risk of distant recurrence after 5 years of tamoxifen in the 18% range. With this intermediate result, the decision was made not to proceed with chemotherapy, since the patient is the sole caregiver to her very elderly mother. Instead the patient proceeded to radiation treatment which was completed 03/06/2011. She started anastrozole at that point. Her subsequent history is as detailed below   INTERVAL HISTORY: Christy Harrell returns today for follow up of her metastatic breast cancer. Per the last progress note, she was to receive a PET scan with antianxiety meds prescribed beforehand. According to the patient, she never got around to taking the lorazepam or prednisone. Before arriving to the location of the MRI, she began to  have chest pain and shortness of breath. She canceled the MRI and was evaluated at her PCP's office where she had a chest Xray, EKG, and labs drawn. She believes all the tests returned negative for cardiac issues. She has plans to reschedule the MRI.  REVIEW OF SYSTEMS: [name] denies fever, chills, nausea, and vomiting. She is constipated, and was recommended by her PCP to start miralax along with her stool softeners. Her last BM was this morning, but was small. She continues to feel "fullness" to her right flank area. [name] is a bit fatigued and is short of breath with activity. She is taking narcotics for her back pain which is likely the source of her constipation. She regularly has hot flashes. She is wearing her sleeve for chronic right upper extremity lymphedema. A detailed review of systems is otherwise noncontributory.   PAST MEDICAL HISTORY: Past Medical History  Diagnosis Date  . Cancer     right breast  . Club foot   . Arthritis   . Thyroid disease   . Hyperlipidemia   . Diabetes mellitus   . Osteoporosis   . Hypertension   . GERD (gastroesophageal reflux disease)     watches diet  . Neuromuscular disorder     lt arm numb sometimes  . Wears glasses   . 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 History of radiation therapy 02/22/13- 03/13/13    left proximal humerus 3500 cGy 14 sessions  . Lymphedema     right arm  . Metastasis from malignant tumor of breast     liver    PAST SURGICAL HISTORY: Past Surgical  History  Procedure Laterality Date  . Cesarean section    . Thyroidectomy  2002  . Foot surgery      as a child for club foot  . Breast surgery  2012    rt lump-16 nodes-in [place]  . Cesarean section    . Colonoscopy    . Dilation and curettage of uterus    . Humerus im nail Left 12/27/2012    Procedure: INTRAMEDULLARY (IM) NAIL HUMERAL LEFT PATHOLOGIC FRACTURE;  Surgeon: Nita Sells, MD;  Location: [facility];  Service: Orthopedics;  Laterality: Left;    FAMILY HISTORY Family History  Problem Relation Age of Onset  . Cancer Neg Hx   . Arthritis Neg Hx   . Heart disease Neg Hx   . Hyperlipidemia Neg Hx   . Hypertension Neg Hx   . Dementia Mother   . Hearing loss Mother    The patient's father died from unknown causes. The patient's mother is alive at age 23. The patient was a single child. There is no history of breast or ovarian cancer in the family to her knowledge.  GYNECOLOGIC HISTORY: Menarche age 36, menopause in her early 22s. The patient is GX P1, first pregnancy to term age 53. She did not take hormone replacement  SOCIAL HISTORY: The patient grew up in [place], attended [employer], then moved to [place] where she lived about 40 years. 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 Her mother, [name], 99, lives with the patient about participates in progress for the elderly which give this alone you a little bit of time to herself. This doesn't mean that she cannot have early morning appointments.. The patient's daughter [name], 77, is disabled secondary to pulmonary hypertension. She can be reached at [phone]. The patient has 2 grandchildren, [name] who works at a Scientist, physiological business and is 73 years old, in general, 63, completing high school.   ADVANCED DIRECTIVES:  HEALTH MAINTENANCE: History  Substance Use Topics  . Smoking status: Former Smoker    Quit date: 12/23/1974  . Smokeless tobacco: Never Used  . Alcohol Use: No     Colonoscopy:  PAP:    Bone density:  2012, on ibandronate chronically  Lipid panel: UTD  Allergies  Allergen Reactions  . Gadolinium Derivatives Other (See Comments)    Pt began having chest numbness/tingling , stated her heart felt funny, had to take her to ED for EKG per RN   CAP    Current Outpatient Prescriptions  Medication Sig Dispense Refill  . aspirin EC 81 MG tablet  Take 1 tablet (81 mg total) by mouth daily.      Marland Kitchen atorvastatin (LIPITOR) 40 MG tablet Take 1 tablet (40 mg total) by mouth daily.  30 tablet  11  . diclofenac sodium (VOLTAREN) 1 % GEL Apply 2 g topically 4 (four) times daily.  2 Tube  2  . Dietary Management Product (VASCULERA) TABS Take 1 capsule by mouth daily.  30 tablet  11  . docusate sodium (COLACE) 100 MG capsule Take 100 mg by mouth 2 (two) times daily as needed for constipation.      Marland Kitchen ezetimibe (ZETIA) 10 MG tablet Take 1 tablet (10 mg total) by mouth daily.  90 tablet  3  . fentaNYL (DURAGESIC - DOSED MCG/HR) 25 MCG/HR patch Place 1 patch (25 mcg total) onto the skin every 3 (three) days. Fill on or after 05/09/14  10 patch  0  . furosemide (LASIX) 40 MG tablet Take 1 tablet (40 mg total) by mouth daily.  30 tablet  6  . letrozole (FEMARA) 2.5 MG tablet Take 1 tablet (2.5 mg total) by mouth daily.  30 tablet  12  . levothyroxine (SYNTHROID) 200 MCG tablet Take 1 tablet (200 mcg total) by mouth daily before breakfast.  90 tablet  1  . lubiprostone (AMITIZA) 24 MCG capsule Take 1 capsule (24 mcg total) by mouth 2 (two) times daily with a meal.  180 capsule  1  . meloxicam (MOBIC) 7.5 MG tablet Take 1 tablet (7.5 mg total) by mouth daily.  20 tablet  0  . metFORMIN (GLUCOPHAGE) 500 MG tablet Take 1 tablet (500 mg total) by mouth daily.  90 tablet  3  . oxyCODONE (OXY IR/ROXICODONE) 5 MG immediate release tablet Take 1 tablet (5 mg total) by mouth every 4 (four) hours as needed for severe pain. Take 1-2 tablets every 4-6 hours as needed for pain - fill on or after 05/09/14  100 tablet  0  . Polyethyl Glycol-Propyl Glycol (SYSTANE ULTRA OP) Place 1 drop into both eyes daily.      . polyethylene glycol powder (GLYCOLAX/MIRALAX) powder Take 255 g (1 Container total) by mouth daily.  255 g  3  . potassium chloride SA (K-DUR,KLOR-CON) 20 MEQ tablet Take 1 tablet (20 mEq total) by mouth daily.  90 tablet  1  . pregabalin (LYRICA) 75 MG capsule  Take 1 capsule (75 mg total) by mouth 2 (two) times daily. Not taking  60 capsule  5  . zolendronic acid (ZOMETA) 4 MG/5ML injection Inject 4 mg into the vein every 30 (thirty) days.      Marland Kitchen LORazepam (ATIVAN) 0.5 MG tablet Take 15 minutes before MRI test; may repeat x 1  10 tablet  0  . predniSONE (DELTASONE) 5 MG tablet Take 4 tablets with food 30 minutes before MRI  12 tablet  0   No current facility-administered medications for this visit.   PHYSICAL EXAM: Elderly African American woman in no acute distress Filed Vitals:   04/18/14 1031  BP: 148/71  Pulse: 70  Temp: 98.3 F (36.8 C)  Resp: 18   Body mass index is 41.11 kg/(m^2).    ECOG FS: 1 Filed Weights   04/18/14 1031  Weight: 210 lb 8 oz (95.482 kg)    Skin: warm, dry  HEENT: sclerae anicteric, conjunctivae pink, oropharynx clear. No thrush or mucositis, teeth in poor repair.  Lymph Nodes: No cervical or supraclavicular lymphadenopathy  Lungs: clear to auscultation bilaterally, no rales, wheezes, or rhonci  Heart: regular rate and rhythm  Abdomen: obese, soft, non tender, positive bowel sounds  Musculoskeletal: No focal spinal tenderness, right upper extremity lymphedema, sleeve present Neuro: non focal, well oriented, positive affect  Breasts deferred  LAB RESULTS: Lab Results  Component Value Date   WBC 5.7 04/18/2014   NEUTROABS 3.1 04/18/2014   HGB 11.3* 04/18/2014   HCT 35.4 04/18/2014   MCV 90.1 04/18/2014   PLT 312 04/18/2014      Chemistry      Component Value Date/Time   NA 142 04/18/2014 1019   NA 139 03/08/2014 1223   K 4.1 04/18/2014 1019   K 3.7 03/08/2014 1223   CL 105 03/08/2014 1223   CL 105 01/23/2013 1337   CO2 25 04/18/2014 1019   CO2 30 03/08/2014 1223   BUN 13.0 04/18/2014 1019   BUN 12  03/08/2014 1223   CREATININE 0.8 04/18/2014 1019   CREATININE 0.8 03/08/2014 1223      Component Value Date/Time   CALCIUM 9.7 04/18/2014 1019   CALCIUM 9.6 03/08/2014 1223   ALKPHOS 59 04/18/2014 1019   ALKPHOS 58 11/27/2013  1404   AST 18 04/18/2014 1019   AST 17 11/27/2013 1404   ALT 9 04/18/2014 1019   ALT 11 03/05/2014 1108   BILITOT 0.23 04/18/2014 1019   BILITOT 0.6 11/27/2013 1404       Lab Results  Component Value Date   LABCA2 24 11/19/2011    STUDIES: Dg Chest 2 View  04/12/2014   CLINICAL DATA:  Atypical chest pain, dyspnea  EXAM: CHEST  2 VIEW  COMPARISON:  March 08, 2014  FINDINGS: The heart size and mediastinal contours are stable. There is no focal infiltrate, pulmonary edema, or pleural effusion. The visualized skeletal structures are stable. Postsurgical changes are identified in the left humerus.  IMPRESSION: No active cardiopulmonary disease.   Electronically Signed   By: Abelardo Diesel M.D.   On: 04/12/2014 14:47   ASSESSMENT: 73 y.o.  [place] woman with stage IV [bone only] breast cancer in a  (1)  status post right lumpectomy and axillary lymph node dissection 11/17/2010 for a pT2 pN1, stage IIB invasive ductal carcinoma, grade 2, estrogen and progesterone receptor positive, HER-2 negative,   (2) Oncotype DX recurrence score of 27 predicting a risk of distant recurrence of 18% with 5 years of tamoxifen (intermediate score);   (3)  status post radiation completed July of 2012,   (4) started anastrozole July 2012, discontinued June 2014 with evidence of metastatic spread to bone  (5) Chronic lymphedema in the right upper extremity.  (6) pathologic fracture of the left humerus along apparent lytic lesion, with no other bone lesions per bone scan 12/12/2012  (7) on 12/27/2012 underwent #1 intramedullary nail left pathologic proximal humerus fracture  #2 left shoulder rotator cuff repair  #3 left shoulder open bone biopsy with pathology showing metastatic breast adenocarcinoma, estrogen receptor 100% positive, progesterone receptor and HER-2 negative, with an MIB-1 of 26%. PET scan 01/13/2013 showed no other areas of disease and  (8) status post 35 Gy to the left proximal humerus completed  03/13/2013(  (9) fulvestrant, first injections on 01/23/2013  (10) zolendronic acid given monthly, first dose 01/23/2013; changed to denosumab as of August 2015 because of access problems  (11) multiple liver lesions noted on scans April 2015  (12) letrozole added May 2015, stopped 04/18/14 because of progression in her liver noted on a chest CT performed on 03/14/14  (13) everolimus and exemestane to be started 04/19/14   PLAN:  Medrith had a CT angiogram ordered by Dr. Ronnald Ramp in early August to rule out a PE that also showed her liver metastasis was worsening. The right hepatic lesion had enlarged since the prior study performed in April of this year. Dr. [name] was informed of this and he advised that she stop the letrozole, and instead switch her to exemestane 67m daily along with everolimus 168mdaily. As the patient had already left the campus before I could communicate this change to her, I called her at her home this afternoon and discussed the change in treatment, as well as possible side effects. [facility] 2 [facility]  Kadesia stated during this phone call that the earliest she could schedule her MRI was Sept 22nd due to being bogged down with  her own affairs. She still has the prescriptions for the lorazepam and prednisone to be taken beforehand. I expressed to her the importance of following up with Dr. [name] soon, and her response was that she is only available during that week at the earliest. Understanding this, we will aim for labs and an office visit immediately following the MRI to discuss her new treatment strategy. Venie understands and agrees with this plan. She knows a goal of treatment in her case is control. She has been encouraged to call with any issues that might arise before her next visit here.   Marcelino Duster    04/18/2014

## 2014-04-18 NOTE — Telephone Encounter (Signed)
Received voice message from patient stating, "I have rescheduled my MRI for Tuesday, 05/01/14, at 12:00 pm. Also, I picked up my medicine. Please give Nira Conn this message." Return number is [phone].

## 2014-04-19 ENCOUNTER — Telehealth: Payer: Self-pay | Admitting: Nurse Practitioner

## 2014-04-19 NOTE — Telephone Encounter (Signed)
cld & spoke to pt and gave appt time & date per pof. Offered pt appt on 9/22 since she would be @ [facility]-pt refused. gaave pt 9/23 appt-pt understood

## 2014-05-01 ENCOUNTER — Ambulatory Visit (HOSPITAL_COMMUNITY)
Admission: RE | Admit: 2014-05-01 | Discharge: 2014-05-01 | Disposition: A | Discharge status: Home / Self Care | Payer: Medicare Other | Source: Ambulatory Visit | Attending: Oncology | Admitting: Oncology

## 2014-05-01 ENCOUNTER — Other Ambulatory Visit: Payer: Self-pay | Admitting: *Deleted

## 2014-05-01 DIAGNOSIS — C50512 Malignant neoplasm of lower-outer quadrant of left female breast: Secondary | ICD-10-CM

## 2014-05-01 DIAGNOSIS — C7951 Secondary malignant neoplasm of bone: Secondary | ICD-10-CM | POA: Diagnosis present

## 2014-05-01 DIAGNOSIS — C50519 Malignant neoplasm of lower-outer quadrant of unspecified female breast: Secondary | ICD-10-CM | POA: Insufficient documentation

## 2014-05-01 DIAGNOSIS — I700 Atherosclerosis of aorta: Secondary | ICD-10-CM | POA: Insufficient documentation

## 2014-05-01 DIAGNOSIS — C7952 Secondary malignant neoplasm of bone marrow: Secondary | ICD-10-CM | POA: Diagnosis present

## 2014-05-02 ENCOUNTER — Ambulatory Visit (HOSPITAL_BASED_OUTPATIENT_CLINIC_OR_DEPARTMENT_OTHER): Payer: Medicare Other | Admitting: Nurse Practitioner

## 2014-05-02 ENCOUNTER — Telehealth: Payer: Self-pay | Admitting: Nurse Practitioner

## 2014-05-02 ENCOUNTER — Other Ambulatory Visit (HOSPITAL_BASED_OUTPATIENT_CLINIC_OR_DEPARTMENT_OTHER): Payer: Medicare Other

## 2014-05-02 ENCOUNTER — Encounter: Payer: Self-pay | Admitting: Nurse Practitioner

## 2014-05-02 VITALS — BP 134/81 | HR 66 | Temp 98.9°F | Resp 18 | Ht 60.0 in | Wt 203.5 lb

## 2014-05-02 DIAGNOSIS — C7952 Secondary malignant neoplasm of bone marrow: Secondary | ICD-10-CM

## 2014-05-02 DIAGNOSIS — C787 Secondary malignant neoplasm of liver and intrahepatic bile duct: Secondary | ICD-10-CM

## 2014-05-02 DIAGNOSIS — R61 Generalized hyperhidrosis: Secondary | ICD-10-CM

## 2014-05-02 DIAGNOSIS — C7951 Secondary malignant neoplasm of bone: Secondary | ICD-10-CM

## 2014-05-02 DIAGNOSIS — Z170 Estrogen receptor positive status [ER+]: Secondary | ICD-10-CM

## 2014-05-02 DIAGNOSIS — C773 Secondary and unspecified malignant neoplasm of axilla and upper limb lymph nodes: Secondary | ICD-10-CM

## 2014-05-02 DIAGNOSIS — I890 Lymphedema, not elsewhere classified: Secondary | ICD-10-CM

## 2014-05-02 DIAGNOSIS — C50519 Malignant neoplasm of lower-outer quadrant of unspecified female breast: Secondary | ICD-10-CM

## 2014-05-02 DIAGNOSIS — C50512 Malignant neoplasm of lower-outer quadrant of left female breast: Secondary | ICD-10-CM

## 2014-05-02 LAB — CBC WITH DIFFERENTIAL/PLATELET
BASO%: 0.7 % (ref 0.0–2.0)
Basophils Absolute: 0 10*3/uL (ref 0.0–0.1)
EOS ABS: 0.1 10*3/uL (ref 0.0–0.5)
EOS%: 1.8 % (ref 0.0–7.0)
HCT: 37.4 % (ref 34.8–46.6)
HGB: 12 g/dL (ref 11.6–15.9)
LYMPH%: 41.3 % (ref 14.0–49.7)
MCH: 28.4 pg (ref 25.1–34.0)
MCHC: 32.2 g/dL (ref 31.5–36.0)
MCV: 88.3 fL (ref 79.5–101.0)
MONO#: 0.7 10*3/uL (ref 0.1–0.9)
MONO%: 11.8 % (ref 0.0–14.0)
NEUT%: 44.4 % (ref 38.4–76.8)
NEUTROS ABS: 2.5 10*3/uL (ref 1.5–6.5)
Platelets: 215 10*3/uL (ref 145–400)
RBC: 4.24 10*6/uL (ref 3.70–5.45)
RDW: 14.5 % (ref 11.2–14.5)
WBC: 5.5 10*3/uL (ref 3.9–10.3)
lymph#: 2.3 10*3/uL (ref 0.9–3.3)

## 2014-05-02 LAB — COMPREHENSIVE METABOLIC PANEL (CC13)
ALBUMIN: 3.1 g/dL — AB (ref 3.5–5.0)
ALT: 9 U/L (ref 0–55)
ANION GAP: 7 meq/L (ref 3–11)
AST: 18 U/L (ref 5–34)
Alkaline Phosphatase: 70 U/L (ref 40–150)
BILIRUBIN TOTAL: 0.29 mg/dL (ref 0.20–1.20)
BUN: 12.2 mg/dL (ref 7.0–26.0)
CALCIUM: 9.7 mg/dL (ref 8.4–10.4)
CHLORIDE: 108 meq/L (ref 98–109)
CO2: 28 meq/L (ref 22–29)
Creatinine: 0.8 mg/dL (ref 0.6–1.1)
GLUCOSE: 151 mg/dL — AB (ref 70–140)
POTASSIUM: 3.5 meq/L (ref 3.5–5.1)
SODIUM: 144 meq/L (ref 136–145)
TOTAL PROTEIN: 7.7 g/dL (ref 6.4–8.3)

## 2014-05-02 NOTE — Progress Notes (Addendum)
ID: Christy Harrell   DOB: 08-02-1941  MR#: [id number]  SHF#:[id number]  PCP: Christy Calico, MD GYN:  SU:  OTHER MD:  Christy Harrell, Christy Harrell, Christy Harrell  CC: Breast cancer stage IV, follow up TREATMENT: exemestane, everolimus, fulvestrant, and denosumab  BREAST CANCER HISTORY: The patient had routine screening mammography 10/16/2010 showing a lobulated mass in the right subareolar region measuring up to 5.5 cm. The Left breast showed some central microcalcifications. Biopsy of the Right breast mass on 10/28/2010 showed an invasive ductal carcinoma, grade 2, estrogen receptor positive (Allred score 8), progesterone receptor positive (Allred score 5), with an equivocal HER-2. The Left breast area of microcalcifications was biopsied at the same time, and was read as suspicious for DCIS.  On 11/17/2010 the patient underwent Right lumpectomy and axillary lymph node dissection for what proved to be a 3 cm invasive ductal carcinoma, grade 2, with some papillary and mucinous features. One of 16 lymph nodes was involved. FISH showed no HER-2 amplification. Left breast biopsy was benign.  The patient had an Oncotype sent, with a score of 27, predicting a risk of distant recurrence after 5 years of tamoxifen in the 18% range. With this intermediate result, the decision was made not to proceed with chemotherapy, since the patient is the sole caregiver to her very elderly mother. Instead the patient proceeded to radiation treatment which was completed 03/06/2011. She started anastrozole at that point. Her subsequent history is as detailed below   INTERVAL HISTORY: Christy Harrell returns today for follow up of her metastatic breast cancer. Since her last visit she had a liver MRI and was started on everolimus and exemestane. She is tolerating both drugs with few complaints. Particularly no mouth sores or rashes. She continues to have hot flashes, but they are unchanged  from their previous nature.   REVIEW OF SYSTEMS: Christy Harrell denies fevers, chills, nausea, and vomiting. Her constipation is managed by miralax and stool softeners. This is likely caused by her narcotic use for back, shoulder, and bilateral leg pain. She is mildly short of breath with activity and has some fatigue. She denies chest pain, palpitations, or cough. She continues to wear a sleeve for her chronic right upper extremity lymphedema. A detailed review of systems is otherwise noncontributory.   PAST MEDICAL HISTORY: Past Medical History  Diagnosis Date  . Cancer     right breast  . Club foot   . Arthritis   . Thyroid disease   . Hyperlipidemia   . Diabetes mellitus   . Osteoporosis   . Hypertension   . GERD (gastroesophageal reflux disease)     watches diet  . Neuromuscular disorder     lt arm numb sometimes  . Wears glasses   . 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 History of radiation therapy 02/22/13- 03/13/13    left proximal humerus 3500 cGy 14 sessions  . Lymphedema     right arm  . Metastasis from malignant tumor of breast     liver    PAST SURGICAL HISTORY: Past Surgical History  Procedure Laterality Date  . Cesarean section    . Thyroidectomy  2002  . Foot surgery      as a child for club foot  . Breast surgery  2012    rt lump-16 nodes-in [place]  . Cesarean section    . Colonoscopy    . Dilation and curettage of uterus    .  Humerus im nail Left 12/27/2012    Procedure: INTRAMEDULLARY (IM) NAIL HUMERAL LEFT PATHOLOGIC FRACTURE;  Surgeon: Nita Sells, MD;  Location: [facility];  Service: Orthopedics;  Laterality: Left;    FAMILY HISTORY Family History  Problem Relation Age of Onset  . Cancer Neg Hx   . Arthritis Neg Hx   . Heart disease Neg Hx   . Hyperlipidemia Neg Hx   . Hypertension Neg Hx   . Dementia Mother   . Hearing loss Mother    The patient's father died from unknown causes. The patient's  mother is alive at age 53. The patient was a single child. There is no history of breast or ovarian cancer in the family to her knowledge.  GYNECOLOGIC HISTORY: Menarche age 26, menopause in her early 4s. The patient is GX P1, first pregnancy to term age 1. She did not take hormone replacement  SOCIAL HISTORY: The patient grew up in [place], attended [employer] school, then moved to [place] where she lived about 40 years. 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 Her mother, Christy Harrell, 99, lives with the patient about participates in progress for the elderly which give this alone you a little bit of time to herself. This doesn't mean that she cannot have early morning appointments.. The patient's daughter Christy Harrell, 40, is disabled secondary to pulmonary hypertension. She can be reached at [phone]. The patient has 2 grandchildren, Christy Harrell who works at a Scientist, physiological business and is 73 years old, in general, 70, completing high school.   ADVANCED DIRECTIVES:  HEALTH MAINTENANCE: History  Substance Use Topics  . Smoking status: Former Smoker    Quit date: 12/23/1974  . Smokeless tobacco: Never Used  . Alcohol Use: No     Colonoscopy:  PAP:    Bone density:  2012, on ibandronate chronically  Lipid panel: UTD  Allergies  Allergen Reactions  . Gadolinium Derivatives Other (See Comments)    Pt began having chest numbness/tingling , stated her heart felt funny, had to take her to ED for EKG per RN   CAP    Current Outpatient Prescriptions  Medication Sig Dispense Refill  . aspirin EC 81 MG tablet Take 1 tablet (81 mg total) by mouth daily.      Marland Kitchen atorvastatin (LIPITOR) 40 MG tablet Take 1 tablet (40 mg total) by mouth daily.  30 tablet  11  . docusate sodium (COLACE) 100 MG capsule Take 100 mg by mouth 2 (two) times daily as needed for constipation.      Marland Kitchen ezetimibe (ZETIA) 10 MG tablet Take 1 tablet (10 mg total) by mouth daily.  90  tablet  3  . fentaNYL (DURAGESIC - DOSED MCG/HR) 25 MCG/HR patch Place 1 patch (25 mcg total) onto the skin every 3 (three) days. Fill on or after 05/09/14  10 patch  0  . furosemide (LASIX) 40 MG tablet Take 1 tablet (40 mg total) by mouth daily.  30 tablet  6  . levothyroxine (SYNTHROID) 200 MCG tablet Take 1 tablet (200 mcg total) by mouth daily before breakfast.  90 tablet  1  . meloxicam (MOBIC) 7.5 MG tablet Take 1 tablet (7.5 mg total) by mouth daily.  20 tablet  0  . metFORMIN (GLUCOPHAGE) 500 MG tablet Take 1 tablet (500 mg total) by mouth daily.  90 tablet  3  . oxyCODONE (OXY IR/ROXICODONE) 5 MG immediate release tablet Take 1 tablet (5 mg  total) by mouth every 4 (four) hours as needed for severe pain. Take 1-2 tablets every 4-6 hours as needed for pain - fill on or after 05/09/14  100 tablet  0  . Polyethyl Glycol-Propyl Glycol (SYSTANE ULTRA OP) Place 1 drop into both eyes daily.      . pregabalin (LYRICA) 75 MG capsule Take 1 capsule (75 mg total) by mouth 2 (two) times daily. Not taking  60 capsule  5  . diclofenac sodium (VOLTAREN) 1 % GEL Apply 2 g topically 4 (four) times daily.  2 Tube  2  . Dietary Management Product (VASCULERA) TABS Take 1 capsule by mouth daily.  30 tablet  11  . LORazepam (ATIVAN) 0.5 MG tablet Take 15 minutes before MRI test; may repeat x 1  10 tablet  0  . lubiprostone (AMITIZA) 24 MCG capsule Take 1 capsule (24 mcg total) by mouth 2 (two) times daily with a meal.  180 capsule  1  . polyethylene glycol powder (GLYCOLAX/MIRALAX) powder Take 255 g (1 Container total) by mouth daily.  255 g  3  . potassium chloride SA (K-DUR,KLOR-CON) 20 MEQ tablet Take 1 tablet (20 mEq total) by mouth daily.  90 tablet  1  . predniSONE (DELTASONE) 5 MG tablet Take 4 tablets with food 30 minutes before MRI  12 tablet  0  . zolendronic acid (ZOMETA) 4 MG/5ML injection Inject 4 mg into the vein every 30 (thirty) days.       No current facility-administered medications for this  visit.   PHYSICAL EXAM: Elderly African American woman in no acute distress Filed Vitals:   05/02/14 1130  BP: 134/81  Pulse: 66  Temp: 98.9 F (37.2 C)  Resp: 18   Body mass index is 39.74 kg/(m^2).    ECOG FS: 1 Filed Weights   05/02/14 1130  Weight: 203 lb 8 oz (92.307 kg)   Sclerae unicteric, pupils equal and reactive Oropharynx clear and moist-- no thrush No cervical or supraclavicular adenopathy Lungs no rales or rhonchi Heart regular rate and rhythm Abd soft, nontender, positive bowel sounds MSK no focal spinal tenderness, significant right arm lymphedema in sleeve Neuro: nonfocal, well oriented, appropriate affect Breasts: deferred   LAB RESULTS: Lab Results  Component Value Date   WBC 5.5 05/02/2014   NEUTROABS 2.5 05/02/2014   HGB 12.0 05/02/2014   HCT 37.4 05/02/2014   MCV 88.3 05/02/2014   PLT 215 05/02/2014      Chemistry      Component Value Date/Time   NA 144 05/02/2014 1050   NA 139 03/08/2014 1223   K 3.5 05/02/2014 1050   K 3.7 03/08/2014 1223   CL 105 03/08/2014 1223   CL 105 01/23/2013 1337   CO2 28 05/02/2014 1050   CO2 30 03/08/2014 1223   BUN 12.2 05/02/2014 1050   BUN 12 03/08/2014 1223   CREATININE 0.8 05/02/2014 1050   CREATININE 0.8 03/08/2014 1223      Component Value Date/Time   CALCIUM 9.7 05/02/2014 1050   CALCIUM 9.6 03/08/2014 1223   ALKPHOS 70 05/02/2014 1050   ALKPHOS 58 11/27/2013 1404   AST 18 05/02/2014 1050   AST 17 11/27/2013 1404   ALT 9 05/02/2014 1050   ALT 11 03/05/2014 1108   BILITOT 0.29 05/02/2014 1050   BILITOT 0.6 11/27/2013 1404       Lab Results  Component Value Date   LABCA2 24 11/19/2011    STUDIES: Dg Chest 2 View  04/12/2014  CLINICAL DATA:  Atypical chest pain, dyspnea  EXAM: CHEST  2 VIEW  COMPARISON:  March 08, 2014  FINDINGS: The heart size and mediastinal contours are stable. There is no focal infiltrate, pulmonary edema, or pleural effusion. The visualized skeletal structures are stable. Postsurgical changes are  identified in the left humerus.  IMPRESSION: No active cardiopulmonary disease.   Electronically Signed   By: Abelardo Diesel M.D.   On: 04/12/2014 14:47   Mr Abdomen Wo Contrast  05/01/2014   CLINICAL DATA:  Followup liver metastasis.  EXAM: MRI ABDOMEN WITHOUT CONTRAST  TECHNIQUE: Multiplanar multisequence MR imaging was performed without the administration of intravenous contrast.  COMPARISON:  12/06/2013  FINDINGS: Lower chest: No pleural effusion identified. Again noted is asymmetric right-sided skin thickening and subcutaneous fat stranding involving the right breast. Resection cavity within the right breast measures 5.7 x 2.6 cm, image 13/series 3.  Hepatobiliary: Multiple liver metastasis are again identified. Index lesion within the lateral segment of left hepatic lobe measures 6.2 x 5.1 cm, image 18/series 3. Previously this measured 5.7 x 5.6 cm. Lesion within the lateral right hepatic lobe measures 2.8 cm, image 21/ series 3. Previously 1.5 cm. The adjacent right hepatic lobe lesion measures 3.7 x 3.5 cm, image 25/ series 3. Previously 3.5 x 3.0 cm. Slightly more inferior in the right hepatic lobe is a 1.8 cm lesion, image 29/ series 3. Previously 7 mm.  The gallbladder appears normal.  No biliary dilatation.  Spleen: Normal appearance of the spleen  Pancreas: The pancreas is on unremarkable.  Stomach/Bowel: The visualized portions of the stomach and upper abdominal bowel loops are grossly unremarkable.  Adrenals/urinary tract: Normal appearance of both adrenal glands. The kidneys are unremarkable.  Vascular/Lymphatic: Normal caliber of the abdominal aorta. There is no retroperitoneal adenopathy. No mesenteric adenopathy noted.  Musculoskeletal: No abnormal marrow signal identified within the visualized bony structures.  Other: No free fluid.  IMPRESSION: 1. Multiple liver lesions compatible with metastasis. Compared with the previous MRI from 12/06/2013 there has been interval progression of disease  within the liver.   Electronically Signed   By: Kerby Moors M.D.   On: 05/01/2014 15:50   ASSESSMENT: 73 y.o.  [place] woman with stage IV [bone only] breast cancer in a  (1)  status post right lumpectomy and axillary lymph node dissection 11/17/2010 for a pT2 pN1, stage IIB invasive ductal carcinoma, grade 2, estrogen and progesterone receptor positive, HER-2 negative,   (2) Oncotype DX recurrence score of 27 predicting a risk of distant recurrence of 18% with 5 years of tamoxifen (intermediate score);   (3)  status post radiation completed July of 2012,   (4) started anastrozole July 2012, discontinued June 2014 with evidence of metastatic spread to bone  (5) Chronic lymphedema in the right upper extremity.  (6) pathologic fracture of the left humerus along apparent lytic lesion, with no other bone lesions per bone scan 12/12/2012  (7) on 12/27/2012 underwent #1 intramedullary nail left pathologic proximal humerus fracture  #2 left shoulder rotator cuff repair  #3 left shoulder open bone biopsy with pathology showing metastatic breast adenocarcinoma, estrogen receptor 100% positive, progesterone receptor and HER-2 negative, with an MIB-1 of 26%. PET scan 01/13/2013 showed no other areas of disease and  (8) status post 35 Gy to the left proximal humerus completed 03/13/2013(  (9) fulvestrant, first injections on 01/23/2013  (10) zolendronic acid given monthly, first dose 01/23/2013; changed to denosumab as of August 2015 because of access problems  (  11) multiple liver lesions noted on scans April 2015  (12) letrozole added May 2015, stopped 04/18/14 because of progression in her liver noted on a chest CT performed on 03/14/14  (13) everolimus and exemestane started 04/19/14   PLAN:  I reviewed the results of her liver MRI which confirms interval progression of liver mets captured by previous studies. We spent about 25 minutes discussing what this means for her treatment. Because  she is tolerating the everolimus and exemestane well, we will continue these drugs and repeat an abdominal MRI in 2-3 months.   Eola will return for an office visit with me in 3 weeks for a follow up visit regarding her tolerance of her drug regimen. She will continue the fulvestrant injections every 4 weeks, and the denosumab injections every 8 weeks. Lyfe understands and agrees with this plan. She has been encouraged to call with any issues that might arise before her next visit here.    Marcelino Duster    05/02/2014     ADDENDUM: Laurice Record is doing remarkably well with her everolimus and exemestane combination. Of course we just started these 2 weeks ago so we would not be expecting to see any significant response. The liver MRI gives [place] a new baseline, which we will follow. Hopefully we will be able to demonstrate at least disease control when we repeat a liver MRI in 2 months.  The patient has a good understanding of the overall plan. She agrees with it. She knows a goal of treatment in her case is control. She will call with any problems that may develop before the next visit, particularly she develops a cough shortness of breath, or mouth sores.   I personally saw this patient and performed a substantive portion of this encounter with the listed APP documented above.   Chauncey Cruel, MD

## 2014-05-02 NOTE — Telephone Encounter (Signed)
, °

## 2014-05-07 ENCOUNTER — Ambulatory Visit (HOSPITAL_BASED_OUTPATIENT_CLINIC_OR_DEPARTMENT_OTHER): Payer: Medicare Other

## 2014-05-07 ENCOUNTER — Other Ambulatory Visit (HOSPITAL_BASED_OUTPATIENT_CLINIC_OR_DEPARTMENT_OTHER): Payer: Medicare Other

## 2014-05-07 VITALS — BP 110/55 | HR 72 | Temp 98.4°F

## 2014-05-07 DIAGNOSIS — C7952 Secondary malignant neoplasm of bone marrow: Secondary | ICD-10-CM

## 2014-05-07 DIAGNOSIS — C50512 Malignant neoplasm of lower-outer quadrant of left female breast: Secondary | ICD-10-CM

## 2014-05-07 DIAGNOSIS — Z5111 Encounter for antineoplastic chemotherapy: Secondary | ICD-10-CM

## 2014-05-07 DIAGNOSIS — M899 Disorder of bone, unspecified: Secondary | ICD-10-CM

## 2014-05-07 DIAGNOSIS — C50519 Malignant neoplasm of lower-outer quadrant of unspecified female breast: Secondary | ICD-10-CM

## 2014-05-07 DIAGNOSIS — C7951 Secondary malignant neoplasm of bone: Secondary | ICD-10-CM

## 2014-05-07 DIAGNOSIS — C50919 Malignant neoplasm of unspecified site of unspecified female breast: Secondary | ICD-10-CM

## 2014-05-07 LAB — CBC WITH DIFFERENTIAL/PLATELET
BASO%: 0.8 % (ref 0.0–2.0)
Basophils Absolute: 0 10*3/uL (ref 0.0–0.1)
EOS%: 4.9 % (ref 0.0–7.0)
Eosinophils Absolute: 0.2 10*3/uL (ref 0.0–0.5)
HEMATOCRIT: 35.4 % (ref 34.8–46.6)
HGB: 11.4 g/dL — ABNORMAL LOW (ref 11.6–15.9)
LYMPH#: 1.7 10*3/uL (ref 0.9–3.3)
LYMPH%: 41.4 % (ref 14.0–49.7)
MCH: 28.4 pg (ref 25.1–34.0)
MCHC: 32.3 g/dL (ref 31.5–36.0)
MCV: 88.1 fL (ref 79.5–101.0)
MONO#: 0.5 10*3/uL (ref 0.1–0.9)
MONO%: 11.8 % (ref 0.0–14.0)
NEUT#: 1.6 10*3/uL (ref 1.5–6.5)
NEUT%: 41.1 % (ref 38.4–76.8)
Platelets: 207 10*3/uL (ref 145–400)
RBC: 4.02 10*6/uL (ref 3.70–5.45)
RDW: 14.1 % (ref 11.2–14.5)
WBC: 4 10*3/uL (ref 3.9–10.3)

## 2014-05-07 LAB — COMPREHENSIVE METABOLIC PANEL (CC13)
ALT: 10 U/L (ref 0–55)
AST: 20 U/L (ref 5–34)
Albumin: 2.8 g/dL — ABNORMAL LOW (ref 3.5–5.0)
Alkaline Phosphatase: 67 U/L (ref 40–150)
Anion Gap: 7 mEq/L (ref 3–11)
BILIRUBIN TOTAL: 0.23 mg/dL (ref 0.20–1.20)
BUN: 10.2 mg/dL (ref 7.0–26.0)
CO2: 27 mEq/L (ref 22–29)
CREATININE: 0.8 mg/dL (ref 0.6–1.1)
Calcium: 9.2 mg/dL (ref 8.4–10.4)
Chloride: 110 mEq/L — ABNORMAL HIGH (ref 98–109)
Glucose: 149 mg/dl — ABNORMAL HIGH (ref 70–140)
Potassium: 3.6 mEq/L (ref 3.5–5.1)
SODIUM: 144 meq/L (ref 136–145)
TOTAL PROTEIN: 6.9 g/dL (ref 6.4–8.3)

## 2014-05-07 MED ORDER — DENOSUMAB 120 MG/1.7ML ~~LOC~~ SOLN
120.0000 mg | Freq: Once | SUBCUTANEOUS | Status: AC
  Administered 2014-05-07: 120 mg via SUBCUTANEOUS
  Filled 2014-05-07: qty 1.7

## 2014-05-07 MED ORDER — FULVESTRANT 250 MG/5ML IM SOLN
500.0000 mg | Freq: Once | INTRAMUSCULAR | Status: AC
  Administered 2014-05-07: 500 mg via INTRAMUSCULAR
  Filled 2014-05-07: qty 10

## 2014-05-09 ENCOUNTER — Telehealth: Payer: Self-pay | Admitting: *Deleted

## 2014-05-09 NOTE — Telephone Encounter (Signed)
Returned pt's call from yesterday.Pt called with irritation and swelling of gums. She noticed sensitivity after eating popcorn the night before.Pt denies any sores in her mouth and is able to eat a soft textured breakfast this am. Pt wanted to be reassured the oral cancer meds she is on didn't cause this problem. She has been on exemestane and everolimus x 3 weeks. I told her that  she probably would have seen a reaction before now if this was the case. Pt did mention she needed to make an appt with her dentist, had not been since 2014. Pt states, " My gums are less tender and less swollen this morning  and I just wanted to let you know". I offered to her an appt with symptom management NP, but pt felt like she didn't need to come in. I told her to call us or go to nearest ED if things worsen. Message to be forwarded to Susanne Borders, NP.

## 2014-05-16 ENCOUNTER — Telehealth: Payer: Self-pay | Admitting: *Deleted

## 2014-05-16 NOTE — Telephone Encounter (Signed)
Pt called to this RN to state onset of sore throat and changes in mouth - " I don't know if I am coming down with a sinus infection or if it is a side effect of the new drugs Dr [name] put me on " " I tried making an appointment with my primary MD but he didn't have any openings "  Pt is taking afinitor and exemustane - started mid September.  This RN discussed above with pt including more likely related to new drugs.  Per conversation and current time of day- appointment made for 10/8 with symptom management clinic.

## 2014-05-17 ENCOUNTER — Encounter: Payer: Self-pay | Admitting: *Deleted

## 2014-05-17 ENCOUNTER — Ambulatory Visit (HOSPITAL_BASED_OUTPATIENT_CLINIC_OR_DEPARTMENT_OTHER): Payer: Medicare Other | Admitting: Nurse Practitioner

## 2014-05-17 ENCOUNTER — Other Ambulatory Visit: Payer: Self-pay | Admitting: *Deleted

## 2014-05-17 VITALS — BP 149/61 | HR 83 | Temp 98.7°F | Resp 18 | Ht 60.0 in | Wt 206.9 lb

## 2014-05-17 DIAGNOSIS — K0889 Other specified disorders of teeth and supporting structures: Secondary | ICD-10-CM

## 2014-05-17 DIAGNOSIS — C7951 Secondary malignant neoplasm of bone: Secondary | ICD-10-CM

## 2014-05-17 DIAGNOSIS — R232 Flushing: Secondary | ICD-10-CM

## 2014-05-17 DIAGNOSIS — I890 Lymphedema, not elsewhere classified: Secondary | ICD-10-CM

## 2014-05-17 DIAGNOSIS — K088 Other specified disorders of teeth and supporting structures: Secondary | ICD-10-CM

## 2014-05-17 DIAGNOSIS — J329 Chronic sinusitis, unspecified: Secondary | ICD-10-CM

## 2014-05-17 DIAGNOSIS — C50512 Malignant neoplasm of lower-outer quadrant of left female breast: Secondary | ICD-10-CM

## 2014-05-17 DIAGNOSIS — N951 Menopausal and female climacteric states: Secondary | ICD-10-CM

## 2014-05-17 DIAGNOSIS — J019 Acute sinusitis, unspecified: Secondary | ICD-10-CM

## 2014-05-17 MED ORDER — AMOXICILLIN-POT CLAVULANATE 875-125 MG PO TABS: 1.0000 | ORAL_TABLET | Freq: Two times a day (BID) | ORAL | Status: DC

## 2014-05-18 ENCOUNTER — Encounter: Payer: Self-pay | Admitting: Nurse Practitioner

## 2014-05-18 DIAGNOSIS — I890 Lymphedema, not elsewhere classified: Secondary | ICD-10-CM | POA: Insufficient documentation

## 2014-05-18 DIAGNOSIS — R232 Flushing: Secondary | ICD-10-CM | POA: Insufficient documentation

## 2014-05-18 DIAGNOSIS — K0889 Other specified disorders of teeth and supporting structures: Secondary | ICD-10-CM | POA: Insufficient documentation

## 2014-05-18 NOTE — Assessment & Plan Note (Signed)
Patient initiated both for support and Aromasin therapy on 04/19/2014.  She appears to be tolerating his new chemotherapy fairly well.  She will continue with a thin Edward 10 mg daily; and Aromasin 25 mg daily as previously directed.  Patient has plans to return on 06/04/2014 for her next Faslodex injection.  She'll return for labs and a follow up visit on 06/08/2014.  Also plan to obtain a restaging liver MRI in mid December 2015.

## 2014-05-18 NOTE — Assessment & Plan Note (Signed)
Patient received Indocin up on an every eight-week basis for her previous rediagnosed bone metastasis.  Last received her Denosumab injection on 05/07/2014.  She will be due her next dose Denosumab injection on 07/02/2014.

## 2014-05-18 NOTE — Progress Notes (Signed)
[place]   Chief Complaint  Patient presents with  . Sinusitis    HPI: Christy Harrell 73 y.o. female diagnosed with breast cancer.  Currently undergoing 4/Aromasin oral therapy; as well as Faslodex and Denosumab therapy.  Agent called the [employer] today requesting urgent care visit.  She states she has had some mild seasonal allergy-type symptoms; as well as some concern for sinusitis as well.  She is complaining of some nasal congestion; and occasional headache.  She denies any fevers or chills.  She denies any productive cough.  He denies any GI symptoms.  Patient also continues with quite significant right upper extremity lymphedema; and continues to wear both a lymphedema arm and hand sleeve.  She states she has not been back to the lymphedema clinic for quite some time since she cannot afford the copayment.  She seemed unaware of any lymphedema drainage exercises.  Patient also continues with hot flashes; but states that these are stable at present.    Sinusitis    CURRENT THERAPY: Upcoming Treatment Dates - BREAST ADJUVANT AC q21d Days with orders from any treatment category:  04/17/2014      dexamethasone (DECADRON) 4 MG tablet      ondansetron (ZOFRAN) 8 MG tablet      prochlorperazine (COMPAZINE) 10 MG tablet      LORazepam (ATIVAN) 0.5 MG tablet      [facility] ON[facility] PHYSICIAN COMMUNICATION ORDER 04/18/2014      [facility] SCHEDULING COMMUNICATION      palonosetron (ALOXI) injection 0.25 mg      Dexamethasone Sodium Phosphate (DECADRON) injection 12 mg      fosaprepitant (EMEND) 150 mg in sodium chloride 0.9 % 145 mL IVPB      DOXOrubicin (ADRIAMYCIN) chemo injection 120 mg      cyclophosphamide (CYTOXAN) 1,200 mg in sodium chloride 0.9 % 250 mL chemo infusion      sodium chloride 0.9 % injection 10 mL      heparin lock flush 100 unit/mL      heparin lock flush 100 unit/mL      alteplase (CATHFLO ACTIVASE) injection 2 mg      sodium chloride 0.9 %  injection 3 mL      Cold Pack 1 packet      0.9 %  sodium chloride infusion      TREATMENT CONDITIONS 04/19/2014      [facility] ON[facility] SCHEDULING COMMUNICATION INJECTION      pegfilgrastim (NEULASTA) injection 6 mg    ROS  Past Medical History  Diagnosis Date  . Cancer     right breast  . Club foot   . Arthritis   . Thyroid disease   . Hyperlipidemia   . Diabetes mellitus   . Osteoporosis   . Hypertension   . GERD (gastroesophageal reflux disease)     watches diet  . Neuromuscular disorder     lt arm numb sometimes  . Wears glasses   . 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 History of radiation therapy 02/22/13- 03/13/13    left proximal humerus 3500 cGy 14 sessions  . Lymphedema     right arm  . 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 Cesarean section    . Thyroidectomy  2002  . Foot surgery      as a child for club foot  .  Breast surgery  2012    rt lump-16 nodes-in [place]  . Cesarean section    . Colonoscopy    . Dilation and curettage of uterus    . Humerus im nail Left 12/27/2012    Procedure: INTRAMEDULLARY (IM) NAIL HUMERAL LEFT PATHOLOGIC FRACTURE;  Surgeon: Nita Sells, MD;  Location: [facility];  Service: Orthopedics;  Laterality: Left;    has Hyperlipidemia LDL goal < 100; Type II or unspecified type diabetes mellitus without mention of complication, uncontrolled; Osteoporosis; Unspecified hypothyroidism; Generalized OA; DJD of shoulder; Spinal stenosis in cervical region; Cervical spondylitis with radiculitis; Lymphedema of upper extremity following lymphadenectomy; Myalgia and myositis, unspecified; Low back pain; Unspecified vitamin D deficiency; Bone metastasis; Venous stasis dermatitis; Breast cancer of lower-outer quadrant of left female breast; Unspecified constipation; Atherosclerosis of aorta; Coronary artery calcification seen on CAT scan; Chest  pain; Severe obesity (BMI >= 40); Cough; D-dimer, elevated; Situational anxiety; Liver metastases; Sinusitis; Lymphedema; Pain, dental; and Hot flashes on her problem list.     is allergic to gadolinium derivatives.    Medication List       This list is accurate as of: 05/17/14 11:59 PM.  Always use your most recent med list.               amoxicillin-clavulanate 875-125 MG per tablet  Commonly known as:  AUGMENTIN  Take 1 tablet by mouth 2 (two) times daily.     aspirin EC 81 MG tablet  Take 1 tablet (81 mg total) by mouth daily.     atorvastatin 40 MG tablet  Commonly known as:  LIPITOR  Take 1 tablet (40 mg total) by mouth daily.     diclofenac sodium 1 % Gel  Commonly known as:  VOLTAREN  Apply 2 g topically 4 (four) times daily.     docusate sodium 100 MG capsule  Commonly known as:  COLACE  Take 100 mg by mouth 2 (two) times daily as needed for constipation.     everolimus 10 MG tablet  Commonly known as:  AFINITOR  Take 10 mg by mouth daily. Started 04/18/2014     exemestane 25 MG tablet  Commonly known as:  AROMASIN  Take 25 mg by mouth daily after breakfast. Started 04/18/2014     ezetimibe 10 MG tablet  Commonly known as:  ZETIA  Take 1 tablet (10 mg total) by mouth daily.     fentaNYL 25 MCG/HR patch  Commonly known as:  DURAGESIC - dosed mcg/hr  Place 1 patch (25 mcg total) onto the skin every 3 (three) days. Fill on or after 05/09/14     furosemide 40 MG tablet  Commonly known as:  LASIX  Take 1 tablet (40 mg total) by mouth daily.     levothyroxine 200 MCG tablet  Commonly known as:  SYNTHROID  Take 1 tablet (200 mcg total) by mouth daily before breakfast.     LORazepam 0.5 MG tablet  Commonly known as:  ATIVAN  Take 15 minutes before MRI test; may repeat x 1     lubiprostone 24 MCG capsule  Commonly known as:  AMITIZA  Take 1 capsule (24 mcg total) by mouth 2 (two) times daily with a meal.     meloxicam 7.5 MG tablet  Commonly known as:   MOBIC  Take 1 tablet (7.5 mg total) by mouth daily.     metFORMIN 500 MG tablet  Commonly known as:  GLUCOPHAGE  Take 1 tablet (500 mg  total) by mouth daily.     oxyCODONE 5 MG immediate release tablet  Commonly known as:  Oxy IR/ROXICODONE  Take 1 tablet (5 mg total) by mouth every 4 (four) hours as needed for severe pain. Take 1-2 tablets every 4-6 hours as needed for pain - fill on or after 05/09/14     polyethylene glycol powder powder  Commonly known as:  GLYCOLAX/MIRALAX  Take 255 g (1 Container total) by mouth daily.     potassium chloride SA 20 MEQ tablet  Commonly known as:  K-DUR,KLOR-CON  Take 1 tablet (20 mEq total) by mouth daily.     predniSONE 5 MG tablet  Commonly known as:  DELTASONE  Take 4 tablets with food 30 minutes before MRI     pregabalin 75 MG capsule  Commonly known as:  LYRICA  Take 1 capsule (75 mg total) by mouth 2 (two) times daily. Not taking     SYSTANE ULTRA OP  Place 1 drop into both eyes daily.     VASCULERA Tabs  Take 1 capsule by mouth daily.     zolendronic acid 4 MG/5ML injection  Commonly known as:  ZOMETA  Inject 4 mg into the vein every 30 (thirty) days.         PHYSICAL EXAMINATION  Blood pressure 149/61, pulse 83, temperature 98.7 F (37.1 C), temperature source Oral, resp. rate 18, height 5' (1.524 m), weight 206 lb 14.4 oz (93.849 kg), SpO2 97.00%.  Physical Exam  Nursing note and vitals reviewed. Constitutional: She is oriented to person, place, and time and well-developed, well-nourished, and in no distress.  HENT:  Head: Normocephalic and atraumatic.  Right Ear: External ear normal.  Left Ear: External ear normal.  Nose: Nose normal.  Mouth/Throat: Oropharynx is clear and moist.  Patient does have multiple teeth posteriorly that are in various stages of decay.  There are some that are partially broken and close to the gumline.  No active infection noted.  No specific tenderness with palpation to calm, teeth, or  jaw.  No facial tenderness with palpation.  No nasal congestion noted on exam.  Oropharynx was within normal limits.  Eyes: Conjunctivae and EOM are normal. Pupils are equal, round, and reactive to light. Right eye exhibits no discharge. Left eye exhibits no discharge. No scleral icterus.  Neck: Normal range of motion. Neck supple. No JVD present. No tracheal deviation present. No thyromegaly present.  Cardiovascular: Normal rate, regular rhythm, normal heart sounds and intact distal pulses.   Pulmonary/Chest: Effort normal and breath sounds normal. No respiratory distress. She has no wheezes. She has no rales.  Abdominal: Soft. Bowel sounds are normal. She exhibits no distension. There is no tenderness. There is no rebound.  Musculoskeletal: Normal range of motion. She exhibits edema. She exhibits no tenderness.  Significant right upper extremity edema noted.  Patient is wearing a lymphedema arm and hand sleeve.  Lymphadenopathy:    She has no cervical adenopathy.  Neurological: She is alert and oriented to person, place, and time. Gait normal.  Skin: Skin is warm and dry. No rash noted. No erythema.  Psychiatric: Affect normal.   ASSESSMENT/PLAN:    Bone metastasis  Assessment & Plan Patient received Indocin up on an every eight-week basis for her previous rediagnosed bone metastasis.  Last received her Denosumab injection on 05/07/2014.  She will be due her next dose Denosumab injection on 07/02/2014.     Breast cancer of lower-outer quadrant of left female breast  Assessment &  Plan Patient initiated both for support and Aromasin therapy on 04/19/2014.  She appears to be tolerating his new chemotherapy fairly well.  She will continue with a thin Edward 10 mg daily; and Aromasin 25 mg daily as previously directed.  Patient has plans to return on 06/04/2014 for her next Faslodex injection.  She'll return for labs and a follow up visit on 06/08/2014.  Also plan to obtain a restaging  liver MRI in mid December 2015.   Sinusitis  Assessment & Plan It does appear the patient has some mild allergy symptoms which include rhinitis and an occasional dry cough.  Patient is complaining of some nasal congestion and headache; but no facial tenderness on exam.  Will prescribe Augmentin twice daily for a total of 10 days for mild sinusitis symptoms.  Patient states that she would like to obtain a flu shot when she is feeling better on her return visit towards the end of October.   Lymphedema  Assessment & Plan Patient does have significant right upper extremity lymphedema is her baseline.  She states that she has been to the lymphedema clinic in the past; but can no longer afford to go.  She continues to wear both a lymphedema sleeve to her arm into her hand.  She seemed unaware of the drainage exercises.  Advised patient to follow up again with the with edema clinic in regards to length drainage exercises.  Patient stated concern regarding financial constraints.  Advised patient that I would order a new lymphedema clinic referral; and also request that the financial counselor call her to see if she can be of assistance.   Pain, dental  Assessment & Plan Patient is complaining of some very vague, mild dental discomfort on occasion only but to her bilateral lower teeth.  On exam-patient has multiple teeth that are in.  Stages of decay and appear broken to almost the gumline.  No active infection noted at to any sites.  No tenderness with palpation to comes lower jaw.  Patient has full range of motion with jaw as well.  Advised patient that she should follow up with a dentist as soon as possible.  Patient states she has not been to a dentist in several years.  Was ordered the patient a dental referral.   Hot flashes  Assessment & Plan Patient continues with hot flashes; and stated they are stable at present.   Patient stated understanding of all instructions; and was in agreement  with this plan of care. The patient knows to call the clinic with any problems, questions or concerns.   Review/collaboration with Dr.  [name]  regarding all aspects of patient's visit today.   Total time spent with patient was  25  minutes;  with greater than  75  percent of that time spent in face to face counseling regarding  her symptoms  and coordination of care and follow up.  Disclaimer: This note was dictated with voice recognition software. Similar sounding words can inadvertently be transcribed and may not be corrected upon review.   Drue Second, NP 05/18/2014

## 2014-05-18 NOTE — Assessment & Plan Note (Signed)
It does appear the patient has some mild allergy symptoms which include rhinitis and an occasional dry cough.  Patient is complaining of some nasal congestion and headache; but no facial tenderness on exam.  Will prescribe Augmentin twice daily for a total of 10 days for mild sinusitis symptoms.  Patient states that she would like to obtain a flu shot when she is feeling better on her return visit towards the end of October.

## 2014-05-18 NOTE — Assessment & Plan Note (Signed)
Patient does have significant right upper extremity lymphedema is her baseline.  She states that she has been to the lymphedema clinic in the past; but can no longer afford to go.  She continues to wear both a lymphedema sleeve to her arm into her hand.  She seemed unaware of the drainage exercises.  Advised patient to follow up again with the with edema clinic in regards to length drainage exercises.  Patient stated concern regarding financial constraints.  Advised patient that I would order a new lymphedema clinic referral; and also request that the financial counselor call her to see if she can be of assistance.

## 2014-05-18 NOTE — Assessment & Plan Note (Signed)
Patient continues with hot flashes; and stated they are stable at present.

## 2014-05-18 NOTE — Assessment & Plan Note (Signed)
Patient is complaining of some very vague, mild dental discomfort on occasion only but to her bilateral lower teeth.  On exam-patient has multiple teeth that are in.  Stages of decay and appear broken to almost the gumline.  No active infection noted at to any sites.  No tenderness with palpation to comes lower jaw.  Patient has full range of motion with jaw as well.  Advised patient that she should follow up with a dentist as soon as possible.  Patient states she has not been to a dentist in several years.  Was ordered the patient a dental referral.

## 2014-05-21 ENCOUNTER — Telehealth: Payer: Self-pay | Admitting: *Deleted

## 2014-05-21 NOTE — Telephone Encounter (Signed)
Spoke with pt today.  Pt stated she is still taking anitbiotics as prescribed.  However, pt has diarrhea associated with antibiotics.  Instructed pt to eat banana, white rice, and toast to help with diarrhea.  Pt stated she was feeling somewhat better today than last week.  Pt aware of next return appts.  Pt understood to call office with any new symptoms.

## 2014-06-04 ENCOUNTER — Ambulatory Visit (HOSPITAL_BASED_OUTPATIENT_CLINIC_OR_DEPARTMENT_OTHER): Payer: Medicare Other

## 2014-06-04 VITALS — BP 121/62 | HR 75 | Temp 98.4°F

## 2014-06-04 DIAGNOSIS — C50512 Malignant neoplasm of lower-outer quadrant of left female breast: Secondary | ICD-10-CM

## 2014-06-04 DIAGNOSIS — C7951 Secondary malignant neoplasm of bone: Secondary | ICD-10-CM

## 2014-06-04 DIAGNOSIS — Z5111 Encounter for antineoplastic chemotherapy: Secondary | ICD-10-CM

## 2014-06-04 DIAGNOSIS — M899 Disorder of bone, unspecified: Secondary | ICD-10-CM

## 2014-06-04 MED ORDER — FULVESTRANT 250 MG/5ML IM SOLN
500.0000 mg | Freq: Once | INTRAMUSCULAR | Status: AC
  Administered 2014-06-04: 500 mg via INTRAMUSCULAR
  Filled 2014-06-04: qty 10

## 2014-06-04 NOTE — Patient Instructions (Signed)
Fulvestrant injection  What is this medicine?  FULVESTRANT (ful VES trant) blocks the effects of estrogen. It is used to treat breast cancer in women past the age of menopause.  This medicine may be used for other purposes; ask your health care provider or pharmacist if you have questions.  COMMON BRAND NAME(S): FASLODEX  What should I tell my health care provider before I take this medicine?  They need to know if you have any of these conditions:  -bleeding problems  -liver disease  -low levels of platelets in the blood  -an unusual or allergic reaction to fulvestrant, other medicines, foods, dyes, or preservatives  -pregnant or trying to get pregnant  -breast-feeding  How should I use this medicine?  This medicine is for injection into a muscle. It is usually given by a health care professional in a hospital or clinic setting.  Talk to your pediatrician regarding the use of this medicine in children. Special care may be needed.  Overdosage: If you think you have taken too much of this medicine contact a poison control center or emergency room at once.  NOTE: This medicine is only for you. Do not share this medicine with others.  What if I miss a dose?  It is important not to miss your dose. Call your doctor or health care professional if you are unable to keep an appointment.  What may interact with this medicine?  -medicines that treat or prevent blood clots like warfarin, enoxaparin, and dalteparin  This list may not describe all possible interactions. Give your health care provider a list of all the medicines, herbs, non-prescription drugs, or dietary supplements you use. Also tell them if you smoke, drink alcohol, or use illegal drugs. Some items may interact with your medicine.  What should I watch for while using this medicine?  Your condition will be monitored carefully while you are receiving this medicine. You will need important blood work done while you are taking this medicine.  Do not become pregnant  while taking this medicine. Women should inform their doctor if they wish to become pregnant or think they might be pregnant. There is a potential for serious side effects to an unborn child. Talk to your health care professional or pharmacist for more information.  What side effects may I notice from receiving this medicine?  Side effects that you should report to your doctor or health care professional as soon as possible:  -allergic reactions like skin rash, itching or hives, swelling of the face, lips, or tongue  -feeling faint or lightheaded, falls  -fever or flu-like symptoms  -sore throat  -vaginal bleeding  Side effects that usually do not require medical attention (report to your doctor or health care professional if they continue or are bothersome):  -aches, pains  -constipation or diarrhea  -headache  -hot flashes  -nausea, vomiting  -pain at site where injected  -stomach pain  This list may not describe all possible side effects. Call your doctor for medical advice about side effects. You may report side effects to FDA at 1-800-FDA-1088.  Where should I keep my medicine?  This drug is given in a hospital or clinic and will not be stored at home.  NOTE: This sheet is a summary. It may not cover all possible information. If you have questions about this medicine, talk to your doctor, pharmacist, or health care provider.   2015, [employer]. (2007-12-05 15:39:24)

## 2014-06-08 ENCOUNTER — Ambulatory Visit (HOSPITAL_BASED_OUTPATIENT_CLINIC_OR_DEPARTMENT_OTHER): Payer: Medicare Other | Admitting: Nurse Practitioner

## 2014-06-08 ENCOUNTER — Telehealth: Payer: Self-pay | Admitting: *Deleted

## 2014-06-08 ENCOUNTER — Other Ambulatory Visit (HOSPITAL_BASED_OUTPATIENT_CLINIC_OR_DEPARTMENT_OTHER): Payer: Medicare Other

## 2014-06-08 ENCOUNTER — Encounter: Payer: Self-pay | Admitting: Nurse Practitioner

## 2014-06-08 VITALS — BP 116/58 | HR 84 | Temp 98.4°F | Resp 20 | Ht 60.0 in | Wt 206.0 lb

## 2014-06-08 DIAGNOSIS — C50512 Malignant neoplasm of lower-outer quadrant of left female breast: Secondary | ICD-10-CM

## 2014-06-08 DIAGNOSIS — C7951 Secondary malignant neoplasm of bone: Secondary | ICD-10-CM

## 2014-06-08 DIAGNOSIS — C50011 Malignant neoplasm of nipple and areola, right female breast: Secondary | ICD-10-CM

## 2014-06-08 DIAGNOSIS — C50919 Malignant neoplasm of unspecified site of unspecified female breast: Secondary | ICD-10-CM

## 2014-06-08 DIAGNOSIS — C50511 Malignant neoplasm of lower-outer quadrant of right female breast: Secondary | ICD-10-CM

## 2014-06-08 DIAGNOSIS — C787 Secondary malignant neoplasm of liver and intrahepatic bile duct: Secondary | ICD-10-CM

## 2014-06-08 DIAGNOSIS — I890 Lymphedema, not elsewhere classified: Secondary | ICD-10-CM

## 2014-06-08 DIAGNOSIS — C773 Secondary and unspecified malignant neoplasm of axilla and upper limb lymph nodes: Secondary | ICD-10-CM

## 2014-06-08 DIAGNOSIS — K088 Other specified disorders of teeth and supporting structures: Secondary | ICD-10-CM

## 2014-06-08 DIAGNOSIS — K089 Disorder of teeth and supporting structures, unspecified: Secondary | ICD-10-CM | POA: Insufficient documentation

## 2014-06-08 LAB — CBC WITH DIFFERENTIAL/PLATELET
BASO%: 1.2 % (ref 0.0–2.0)
BASOS ABS: 0.1 10*3/uL (ref 0.0–0.1)
EOS%: 3.6 % (ref 0.0–7.0)
Eosinophils Absolute: 0.2 10*3/uL (ref 0.0–0.5)
HEMATOCRIT: 36.5 % (ref 34.8–46.6)
HEMOGLOBIN: 11.8 g/dL (ref 11.6–15.9)
LYMPH%: 26.4 % (ref 14.0–49.7)
MCH: 27.4 pg (ref 25.1–34.0)
MCHC: 32.3 g/dL (ref 31.5–36.0)
MCV: 84.8 fL (ref 79.5–101.0)
MONO#: 0.5 10*3/uL (ref 0.1–0.9)
MONO%: 9.1 % (ref 0.0–14.0)
NEUT#: 3.2 10*3/uL (ref 1.5–6.5)
NEUT%: 59.7 % (ref 38.4–76.8)
Platelets: 221 10*3/uL (ref 145–400)
RBC: 4.31 10*6/uL (ref 3.70–5.45)
RDW: 14 % (ref 11.2–14.5)
WBC: 5.4 10*3/uL (ref 3.9–10.3)
lymph#: 1.4 10*3/uL (ref 0.9–3.3)

## 2014-06-08 LAB — COMPREHENSIVE METABOLIC PANEL (CC13)
ALK PHOS: 74 U/L (ref 40–150)
ALT: 13 U/L (ref 0–55)
AST: 19 U/L (ref 5–34)
Albumin: 3 g/dL — ABNORMAL LOW (ref 3.5–5.0)
Anion Gap: 9 mEq/L (ref 3–11)
BILIRUBIN TOTAL: 0.42 mg/dL (ref 0.20–1.20)
BUN: 11 mg/dL (ref 7.0–26.0)
CALCIUM: 9.4 mg/dL (ref 8.4–10.4)
CO2: 27 mEq/L (ref 22–29)
Chloride: 108 mEq/L (ref 98–109)
Creatinine: 0.9 mg/dL (ref 0.6–1.1)
Glucose: 154 mg/dl — ABNORMAL HIGH (ref 70–140)
Potassium: 3.6 mEq/L (ref 3.5–5.1)
Sodium: 144 mEq/L (ref 136–145)
Total Protein: 7.3 g/dL (ref 6.4–8.3)

## 2014-06-08 NOTE — Telephone Encounter (Signed)
Called pt to f/u about her finding a dentist to address her dental needs . Pt has a tooth that has broken off and needs attention. Pt has agreed to find a dentist and get this taken care of. I told pt in meantime to brush frequently and floss as well. Encouraged pt to use Biotene mouth rinse as well. Pt agreed to plan. I will call pt next week to followup.

## 2014-06-08 NOTE — Progress Notes (Signed)
ID: Christy Harrell   DOB: June 18, 1941  MR#: [id number]  EPP#:[id number]  PCP: [name], MD GYN:  SU:  OTHER MD:  Alysia Penna, Frederik Pear, Faustino Congress  CC: Breast cancer stage IV, follow up TREATMENT: exemestane, everolimus, fulvestrant, and denosumab  BREAST CANCER HISTORY: The patient had routine screening mammography 10/16/2010 showing a lobulated mass in the right subareolar region measuring up to 5.5 cm. The Left breast showed some central microcalcifications. Biopsy of the Right breast mass on 10/28/2010 showed an invasive ductal carcinoma, grade 2, estrogen receptor positive (Allred score 8), progesterone receptor positive (Allred score 5), with an equivocal HER-2. The Left breast area of microcalcifications was biopsied at the same time, and was read as suspicious for DCIS.  On 11/17/2010 the patient underwent Right lumpectomy and axillary lymph node dissection for what proved to be a 3 cm invasive ductal carcinoma, grade 2, with some papillary and mucinous features. One of 16 lymph nodes was involved. FISH showed no HER-2 amplification. Left breast biopsy was benign.  The patient had an Oncotype sent, with a score of 27, predicting a risk of distant recurrence after 5 years of tamoxifen in the 18% range. With this intermediate result, the decision was made not to proceed with chemotherapy, since the patient is the sole caregiver to her very elderly mother. Instead the patient proceeded to radiation treatment which was completed 03/06/2011. She started anastrozole at that point. Her subsequent history is as detailed below   INTERVAL HISTORY: Bellany returns today for follow up of her metastatic breast cancer. She continues on the exemestane and everoliumus daily and is tolerating them fairly well. She has hot flashes, but denies vaginal changes, arthralgias/myalgials. She denies rashes or mouth sores, but she does complain of mild dental  discomfort to her bilateral lower teeth, she has one in particular that may be decaying and has broken off almost to the gumline. Aside from her teeth, the rest of the mouth is stable with no breakdown. Also new is a decreased appetite, as soon as she begins eating, she doesn't feel like finishing her meal.   REVIEW OF SYSTEMS: Gissele denies fevers or chills. She has some mild nausea managed with ginger ale, she declines any offers for nausea meds at this time. Her constipation is managed by miralax and stool softeners. She is mildly short of breath with activity and has some fatigue. She denies chest pain, palpitations, or cough. She continues to wear a sleeve for her chronic right upper extremity lymphedema. A detailed review of systems is otherwise noncontributory.   PAST MEDICAL HISTORY: Past Medical History  Diagnosis Date  . Cancer     right breast  . Club foot   . Arthritis   . Thyroid disease   . Hyperlipidemia   . Diabetes mellitus   . Osteoporosis   . Hypertension   . GERD (gastroesophageal reflux disease)     watches diet  . Neuromuscular disorder     lt arm numb sometimes  . Wears glasses   . 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 History of radiation therapy 02/22/13- 03/13/13    left proximal humerus 3500 cGy 14 sessions  . Lymphedema     right arm  . Metastasis from malignant tumor of breast     liver    PAST SURGICAL HISTORY: Past Surgical History  Procedure Laterality Date  . Cesarean section    . Thyroidectomy  2002  .  Foot surgery      as a child for club foot  . Breast surgery  2012    rt lump-16 nodes-in [place]  . Cesarean section    . Colonoscopy    . Dilation and curettage of uterus    . Humerus im nail Left 12/27/2012    Procedure: INTRAMEDULLARY (IM) NAIL HUMERAL LEFT PATHOLOGIC FRACTURE;  Surgeon: Nita Sells, MD;  Location: [facility];  Service: Orthopedics;  Laterality: Left;    FAMILY  HISTORY Family History  Problem Relation Age of Onset  . Cancer Neg Hx   . Arthritis Neg Hx   . Heart disease Neg Hx   . Hyperlipidemia Neg Hx   . Hypertension Neg Hx   . Dementia Mother   . Hearing loss Mother    The patient's father died from unknown causes. The patient's mother is alive at age 101. The patient was a single child. There is no history of breast or ovarian cancer in the family to her knowledge.  GYNECOLOGIC HISTORY: Menarche age 50, menopause in her early 37s. The patient is GX P1, first pregnancy to term age 10. She did not take hormone replacement  SOCIAL HISTORY: The patient grew up in [place], attended [employer], then moved to [place] where she lived about 40 years. 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 Her mother, [name], 99, lives with the patient about participates in progress for the elderly which give this alone you a little bit of time to herself. This doesn't mean that she cannot have early morning appointments.. The patient's daughter [name], 48, is disabled secondary to pulmonary hypertension. She can be reached at [phone]. The patient has 2 grandchildren, [name] who works at a Scientist, physiological business and is 73 years old, in general, 65, completing high school.   ADVANCED DIRECTIVES:  HEALTH MAINTENANCE: History  Substance Use Topics  . Smoking status: Former Smoker    Quit date: 12/23/1974  . Smokeless tobacco: Never Used  . Alcohol Use: No     Colonoscopy:  PAP:    Bone density:  2012, on ibandronate chronically  Lipid panel: UTD  Allergies  Allergen Reactions  . Gadolinium Derivatives Other (See Comments)    Pt began having chest numbness/tingling , stated her heart felt funny, had to take her to ED for EKG per RN   CAP    Current Outpatient Prescriptions  Medication Sig Dispense Refill  . amoxicillin-clavulanate (AUGMENTIN) 875-125 MG per tablet Take 1 tablet by mouth 2 (two)  times daily.  20 tablet  0  . aspirin EC 81 MG tablet Take 1 tablet (81 mg total) by mouth daily.      Marland Kitchen atorvastatin (LIPITOR) 40 MG tablet Take 1 tablet (40 mg total) by mouth daily.  30 tablet  11  . diclofenac sodium (VOLTAREN) 1 % GEL Apply 2 g topically 4 (four) times daily.  2 Tube  2  . Dietary Management Product (VASCULERA) TABS Take 1 capsule by mouth daily.  30 tablet  11  . docusate sodium (COLACE) 100 MG capsule Take 100 mg by mouth 2 (two) times daily as needed for constipation.      Marland Kitchen everolimus (AFINITOR) 10 MG tablet Take 10 mg by mouth daily. Started 04/18/2014      . exemestane (AROMASIN) 25 MG tablet Take 25 mg by mouth daily after breakfast. Started 04/18/2014      . ezetimibe (ZETIA) 10 MG  tablet Take 1 tablet (10 mg total) by mouth daily.  90 tablet  3  . fentaNYL (DURAGESIC - DOSED MCG/HR) 25 MCG/HR patch Place 1 patch (25 mcg total) onto the skin every 3 (three) days. Fill on or after 05/09/14  10 patch  0  . furosemide (LASIX) 40 MG tablet Take 1 tablet (40 mg total) by mouth daily.  30 tablet  6  . levothyroxine (SYNTHROID) 200 MCG tablet Take 1 tablet (200 mcg total) by mouth daily before breakfast.  90 tablet  1  . LORazepam (ATIVAN) 0.5 MG tablet Take 15 minutes before MRI test; may repeat x 1  10 tablet  0  . lubiprostone (AMITIZA) 24 MCG capsule Take 1 capsule (24 mcg total) by mouth 2 (two) times daily with a meal.  180 capsule  1  . meloxicam (MOBIC) 7.5 MG tablet Take 1 tablet (7.5 mg total) by mouth daily.  20 tablet  0  . metFORMIN (GLUCOPHAGE) 500 MG tablet Take 1 tablet (500 mg total) by mouth daily.  90 tablet  3  . oxyCODONE (OXY IR/ROXICODONE) 5 MG immediate release tablet Take 1 tablet (5 mg total) by mouth every 4 (four) hours as needed for severe pain. Take 1-2 tablets every 4-6 hours as needed for pain - fill on or after 05/09/14  100 tablet  0  . Polyethyl Glycol-Propyl Glycol (SYSTANE ULTRA OP) Place 1 drop into both eyes daily.      . polyethylene glycol  powder (GLYCOLAX/MIRALAX) powder Take 255 g (1 Container total) by mouth daily.  255 g  3  . potassium chloride SA (K-DUR,KLOR-CON) 20 MEQ tablet Take 1 tablet (20 mEq total) by mouth daily.  90 tablet  1  . pregabalin (LYRICA) 75 MG capsule Take 1 capsule (75 mg total) by mouth 2 (two) times daily. Not taking  60 capsule  5  . predniSONE (DELTASONE) 5 MG tablet Take 4 tablets with food 30 minutes before MRI  12 tablet  0  . zolendronic acid (ZOMETA) 4 MG/5ML injection Inject 4 mg into the vein every 30 (thirty) days.       No current facility-administered medications for this visit.   PHYSICAL EXAM: Elderly African American woman in no acute distress Filed Vitals:   06/08/14 1103  BP: 116/58  Pulse: 84  Temp: 98.4 F (36.9 C)  Resp: 20   Body mass index is 40.23 kg/(m^2).    ECOG FS: 1 Filed Weights   06/08/14 1103  Weight: 206 lb (93.441 kg)   Skin: warm, dry  HEENT: sclerae anicteric, conjunctivae pink, oropharynx clear. No thrush or mucositis. Teeth in poor dentition Lymph Nodes: No cervical or supraclavicular lymphadenopathy  Lungs: clear to auscultation bilaterally, no rales, wheezes, or rhonci  Heart: regular rate and rhythm  Abdomen: round, soft, non tender, positive bowel sounds  Musculoskeletal: No focal spinal tenderness, no peripheral edema, significant right arm lymphedema in sleeve Neuro: non focal, well oriented, positive affect  Breasts: deferred   LAB RESULTS: Lab Results  Component Value Date   WBC 5.4 06/08/2014   NEUTROABS 3.2 06/08/2014   HGB 11.8 06/08/2014   HCT 36.5 06/08/2014   MCV 84.8 06/08/2014   PLT 221 06/08/2014      Chemistry      Component Value Date/Time   NA 144 06/08/2014 1050   NA 139 03/08/2014 1223   K 3.6 06/08/2014 1050   K 3.7 03/08/2014 1223   CL 105 03/08/2014 1223   CL 105 01/23/2013  1337   CO2 27 06/08/2014 1050   CO2 30 03/08/2014 1223   BUN 11.0 06/08/2014 1050   BUN 12 03/08/2014 1223   CREATININE 0.9 06/08/2014 1050    CREATININE 0.8 03/08/2014 1223      Component Value Date/Time   CALCIUM 9.4 06/08/2014 1050   CALCIUM 9.6 03/08/2014 1223   ALKPHOS 74 06/08/2014 1050   ALKPHOS 58 11/27/2013 1404   AST 19 06/08/2014 1050   AST 17 11/27/2013 1404   ALT 13 06/08/2014 1050   ALT 11 03/05/2014 1108   BILITOT 0.42 06/08/2014 1050   BILITOT 0.6 11/27/2013 1404       Lab Results  Component Value Date   LABCA2 24 11/19/2011    STUDIES: No results found.  ASSESSMENT: 73 y.o.   woman with stage IV [bone only] breast cancer in a  (1)  status post right lumpectomy and axillary lymph node dissection 11/17/2010 for a pT2 pN1, stage IIB invasive ductal carcinoma, grade 2, estrogen and progesterone receptor positive, HER-2 negative,   (2) Oncotype DX recurrence score of 27 predicting a risk of distant recurrence of 18% with 5 years of tamoxifen (intermediate score);   (3)  status post radiation completed July of 2012,   (4) started anastrozole July 2012, discontinued June 2014 with evidence of metastatic spread to bone  (5) Chronic lymphedema in the right upper extremity.  (6) pathologic fracture of the left humerus along apparent lytic lesion, with no other bone lesions per bone scan 12/12/2012  (7) on 12/27/2012 underwent #1 intramedullary nail left pathologic proximal humerus fracture  #2 left shoulder rotator cuff repair  #3 left shoulder open bone biopsy with pathology showing metastatic breast adenocarcinoma, estrogen receptor 100% positive, progesterone receptor and HER-2 negative, with an MIB-1 of 26%. PET scan 01/13/2013 showed no other areas of disease and  (8) status post 35 Gy to the left proximal humerus completed 03/13/2013(  (9) fulvestrant, first injections on 01/23/2013  (10) zolendronic acid given monthly, first dose 01/23/2013; changed to denosumab as of August 2015 because of access problems  (11) multiple liver lesions noted on scans April 2015  (12) letrozole added  May 2015, stopped 04/18/14 because of progression in her liver noted on a chest CT performed on 03/14/14  (13) everolimus and exemestane started 04/19/14   PLAN:  Overall Loreena is doing well. The labs were reviewed in detail and were stable. For the most part she is tolerating exemestane and everolimus well, and she will continue on these meds. I think the gum discomfort she is experiencing is related to tooth decay. My nurse spoke with her over the phone regarding dental hygiene including brushing, flossing, and using a Biotene mouth rinse. We have given her instructions to contact a dentist for immediate dental care, as she has likely not had a cleaning in close to a decade. She is also on denosumab, which puts her at risk for osteonecrosis of the jaw.  The rest of her mouth is stable, no abscesses, or mouth sores .   Florena will continue the fulvestrant injections every 4 weeks. Her next MRI is scheduled for 12/1. She will follow up with labs and an office visit on 12/8 with Dr. [name] to review these results. She understands and agrees with this plan. She knows the goal of treatment in her case is control. She has been encouraged to call with any issues that might arise before her next visit here.   Marcelino Duster, NP

## 2014-06-25 ENCOUNTER — Ambulatory Visit: Payer: Medicare Other | Admitting: Internal Medicine

## 2014-06-26 ENCOUNTER — Ambulatory Visit: Payer: Medicare Other | Admitting: Internal Medicine

## 2014-07-02 ENCOUNTER — Ambulatory Visit (HOSPITAL_BASED_OUTPATIENT_CLINIC_OR_DEPARTMENT_OTHER): Payer: Medicare Other

## 2014-07-02 DIAGNOSIS — C50511 Malignant neoplasm of lower-outer quadrant of right female breast: Secondary | ICD-10-CM

## 2014-07-02 DIAGNOSIS — C50512 Malignant neoplasm of lower-outer quadrant of left female breast: Secondary | ICD-10-CM

## 2014-07-02 DIAGNOSIS — Z5111 Encounter for antineoplastic chemotherapy: Secondary | ICD-10-CM

## 2014-07-02 DIAGNOSIS — M899 Disorder of bone, unspecified: Secondary | ICD-10-CM

## 2014-07-02 DIAGNOSIS — C7951 Secondary malignant neoplasm of bone: Secondary | ICD-10-CM

## 2014-07-02 MED ORDER — FULVESTRANT 250 MG/5ML IM SOLN
500.0000 mg | Freq: Once | INTRAMUSCULAR | Status: AC
  Administered 2014-07-02: 500 mg via INTRAMUSCULAR
  Filled 2014-07-02: qty 10

## 2014-07-02 MED ORDER — DENOSUMAB 120 MG/1.7ML ~~LOC~~ SOLN
120.0000 mg | Freq: Once | SUBCUTANEOUS | Status: AC
  Administered 2014-07-02: 120 mg via SUBCUTANEOUS
  Filled 2014-07-02: qty 1.7

## 2014-07-02 NOTE — Patient Instructions (Signed)
Denosumab injection What is this medicine? DENOSUMAB (den oh sue mab) slows bone breakdown. Prolia is used to treat osteoporosis in women after menopause and in men. Xgeva is used to prevent bone fractures and other bone problems caused by cancer bone metastases. Xgeva is also used to treat giant cell tumor of the bone. This medicine may be used for other purposes; ask your health care provider or pharmacist if you have questions. COMMON BRAND NAME(S): Prolia, XGEVA What should I tell my health care provider before I take this medicine? They need to know if you have any of these conditions: -dental disease -eczema -infection or history of infections -kidney disease or on dialysis -low blood calcium or vitamin D -malabsorption syndrome -scheduled to have surgery or tooth extraction -taking medicine that contains denosumab -thyroid or parathyroid disease -an unusual reaction to denosumab, other medicines, foods, dyes, or preservatives -pregnant or trying to get pregnant -breast-feeding How should I use this medicine? This medicine is for injection under the skin. It is given by a health care professional in a hospital or clinic setting. If you are getting Prolia, a special MedGuide will be given to you by the pharmacist with each prescription and refill. Be sure to read this information carefully each time. For Prolia, talk to your pediatrician regarding the use of this medicine in children. Special care may be needed. For Xgeva, talk to your pediatrician regarding the use of this medicine in children. While this drug may be prescribed for children as young as 13 years for selected conditions, precautions do apply. Overdosage: If you think you've taken too much of this medicine contact a poison control center or emergency room at once. Overdosage: If you think you have taken too much of this medicine contact a poison control center or emergency room at once. NOTE: This medicine is only for  you. Do not share this medicine with others. What if I miss a dose? It is important not to miss your dose. Call your doctor or health care professional if you are unable to keep an appointment. What may interact with this medicine? Do not take this medicine with any of the following medications: -other medicines containing denosumab This medicine may also interact with the following medications: -medicines that suppress the immune system -medicines that treat cancer -steroid medicines like prednisone or cortisone This list may not describe all possible interactions. Give your health care provider a list of all the medicines, herbs, non-prescription drugs, or dietary supplements you use. Also tell them if you smoke, drink alcohol, or use illegal drugs. Some items may interact with your medicine. What should I watch for while using this medicine? Visit your doctor or health care professional for regular checks on your progress. Your doctor or health care professional may order blood tests and other tests to see how you are doing. Call your doctor or health care professional if you get a cold or other infection while receiving this medicine. Do not treat yourself. This medicine may decrease your body's ability to fight infection. You should make sure you get enough calcium and vitamin D while you are taking this medicine, unless your doctor tells you not to. Discuss the foods you eat and the vitamins you take with your health care professional. See your dentist regularly. Brush and floss your teeth as directed. Before you have any dental work done, tell your dentist you are receiving this medicine. Do not become pregnant while taking this medicine or for 5 months after stopping   it. Women should inform their doctor if they wish to become pregnant or think they might be pregnant. There is a potential for serious side effects to an unborn child. Talk to your health care professional or pharmacist for more  information. What side effects may I notice from receiving this medicine? Side effects that you should report to your doctor or health care professional as soon as possible: -allergic reactions like skin rash, itching or hives, swelling of the face, lips, or tongue -breathing problems -chest pain -fast, irregular heartbeat -feeling faint or lightheaded, falls -fever, chills, or any other sign of infection -muscle spasms, tightening, or twitches -numbness or tingling -skin blisters or bumps, or is dry, peels, or red -slow healing or unexplained pain in the mouth or jaw -unusual bleeding or bruising Side effects that usually do not require medical attention (Report these to your doctor or health care professional if they continue or are bothersome.): -muscle pain -stomach upset, gas This list may not describe all possible side effects. Call your doctor for medical advice about side effects. You may report side effects to FDA at 1-800-FDA-1088. Where should I keep my medicine? This medicine is only given in a clinic, doctor's office, or other health care setting and will not be stored at home. NOTE: This sheet is a summary. It may not cover all possible information. If you have questions about this medicine, talk to your doctor, pharmacist, or health care provider.  2015, Elsevier/Gold Standard. (2012-01-25 12:37:47)  Fulvestrant injection What is this medicine? FULVESTRANT (ful VES trant) blocks the effects of estrogen. It is used to treat breast cancer in women past the age of menopause. This medicine may be used for other purposes; ask your health care provider or pharmacist if you have questions. COMMON BRAND NAME(S): FASLODEX What should I tell my health care provider before I take this medicine? They need to know if you have any of these conditions: -bleeding problems -liver disease -low levels of platelets in the blood -an unusual or allergic reaction to fulvestrant, other  medicines, foods, dyes, or preservatives -pregnant or trying to get pregnant -breast-feeding How should I use this medicine? This medicine is for injection into a muscle. It is usually given by a health care professional in a hospital or clinic setting. Talk to your pediatrician regarding the use of this medicine in children. Special care may be needed. Overdosage: If you think you have taken too much of this medicine contact a poison control center or emergency room at once. NOTE: This medicine is only for you. Do not share this medicine with others. What if I miss a dose? It is important not to miss your dose. Call your doctor or health care professional if you are unable to keep an appointment. What may interact with this medicine? -medicines that treat or prevent blood clots like warfarin, enoxaparin, and dalteparin This list may not describe all possible interactions. Give your health care provider a list of all the medicines, herbs, non-prescription drugs, or dietary supplements you use. Also tell them if you smoke, drink alcohol, or use illegal drugs. Some items may interact with your medicine. What should I watch for while using this medicine? Your condition will be monitored carefully while you are receiving this medicine. You will need important blood work done while you are taking this medicine. Do not become pregnant while taking this medicine. Women should inform their doctor if they wish to become pregnant or think they might be pregnant. There is   a potential for serious side effects to an unborn child. Talk to your health care professional or pharmacist for more information. What side effects may I notice from receiving this medicine? Side effects that you should report to your doctor or health care professional as soon as possible: -allergic reactions like skin rash, itching or hives, swelling of the face, lips, or tongue -feeling faint or lightheaded, falls -fever or flu-like  symptoms -sore throat -vaginal bleeding Side effects that usually do not require medical attention (report to your doctor or health care professional if they continue or are bothersome): -aches, pains -constipation or diarrhea -headache -hot flashes -nausea, vomiting -pain at site where injected -stomach pain This list may not describe all possible side effects. Call your doctor for medical advice about side effects. You may report side effects to FDA at 1-800-FDA-1088. Where should I keep my medicine? This drug is given in a hospital or clinic and will not be stored at home. NOTE: This sheet is a summary. It may not cover all possible information. If you have questions about this medicine, talk to your doctor, pharmacist, or health care provider.  2015, Elsevier/Gold Standard. (2007-12-05 15:39:24)  

## 2014-07-09 ENCOUNTER — Encounter: Payer: Self-pay | Admitting: Internal Medicine

## 2014-07-09 ENCOUNTER — Ambulatory Visit (INDEPENDENT_AMBULATORY_CARE_PROVIDER_SITE_OTHER): Payer: Medicare Other | Admitting: Internal Medicine

## 2014-07-09 ENCOUNTER — Other Ambulatory Visit (INDEPENDENT_AMBULATORY_CARE_PROVIDER_SITE_OTHER): Payer: Medicare Other

## 2014-07-09 VITALS — BP 120/64 | HR 86 | Temp 98.3°F | Ht 60.0 in | Wt 202.0 lb

## 2014-07-09 DIAGNOSIS — M19019 Primary osteoarthritis, unspecified shoulder: Secondary | ICD-10-CM

## 2014-07-09 DIAGNOSIS — L602 Onychogryphosis: Secondary | ICD-10-CM

## 2014-07-09 DIAGNOSIS — E038 Other specified hypothyroidism: Secondary | ICD-10-CM

## 2014-07-09 DIAGNOSIS — E118 Type 2 diabetes mellitus with unspecified complications: Secondary | ICD-10-CM

## 2014-07-09 DIAGNOSIS — M19012 Primary osteoarthritis, left shoulder: Secondary | ICD-10-CM

## 2014-07-09 DIAGNOSIS — M19011 Primary osteoarthritis, right shoulder: Secondary | ICD-10-CM

## 2014-07-09 DIAGNOSIS — Z23 Encounter for immunization: Secondary | ICD-10-CM

## 2014-07-09 DIAGNOSIS — E039 Hypothyroidism, unspecified: Secondary | ICD-10-CM

## 2014-07-09 DIAGNOSIS — Z0000 Encounter for general adult medical examination without abnormal findings: Secondary | ICD-10-CM

## 2014-07-09 DIAGNOSIS — M159 Polyosteoarthritis, unspecified: Secondary | ICD-10-CM

## 2014-07-09 LAB — BASIC METABOLIC PANEL
BUN: 12 mg/dL (ref 6–23)
CO2: 26 meq/L (ref 19–32)
Calcium: 8.4 mg/dL (ref 8.4–10.5)
Chloride: 103 mEq/L (ref 96–112)
Creatinine, Ser: 0.7 mg/dL (ref 0.4–1.2)
GFR: 112.93 mL/min (ref >60.00)
GLUCOSE: 87 mg/dL (ref 70–99)
POTASSIUM: 3.3 meq/L — AB (ref 3.5–5.1)
SODIUM: 137 meq/L (ref 135–145)

## 2014-07-09 LAB — TSH: TSH: 0.06 u[IU]/mL — ABNORMAL LOW (ref 0.35–4.50)

## 2014-07-09 LAB — HEMOGLOBIN A1C: Hgb A1c MFr Bld: 7.7 % — ABNORMAL HIGH (ref 4.6–6.5)

## 2014-07-09 MED ORDER — FENTANYL 50 MCG/HR TD PT72: 50.0000 ug | MEDICATED_PATCH | TRANSDERMAL | Status: DC

## 2014-07-09 MED ORDER — LEVOTHYROXINE SODIUM 175 MCG PO TABS: 175.0000 ug | ORAL_TABLET | Freq: Every day | ORAL | Status: DC

## 2014-07-09 NOTE — Progress Notes (Signed)
Pre visit review using our clinic review tool, if applicable. No additional management support is needed unless otherwise documented below in the visit note. 

## 2014-07-09 NOTE — Progress Notes (Signed)
   Subjective:    Patient ID: Christy Harrell, female    DOB: 1940/09/01, 73 y.o.   MRN: [id number]  Thyroid Problem Presents for follow-up visit. Symptoms include weight loss. Patient reports no anxiety, cold intolerance, constipation, depressed mood, diaphoresis, diarrhea, dry skin, fatigue, hair loss, heat intolerance, hoarse voice, leg swelling, nail problem, palpitations, tremors, visual change or weight gain. The symptoms have been resolved. The treatment provided moderate relief.      Review of Systems  Constitutional: Positive for weight loss. Negative for fever, chills, weight gain, diaphoresis, appetite change and fatigue.  HENT: Negative.  Negative for hoarse voice.   Eyes: Negative.   Respiratory: Negative.  Negative for cough, choking, chest tightness, shortness of breath and stridor.   Cardiovascular: Negative.  Negative for chest pain, palpitations and leg swelling.  Gastrointestinal: Negative.  Negative for nausea, vomiting, abdominal pain, diarrhea, constipation and blood in stool.  Endocrine: Negative.  Negative for cold intolerance, heat intolerance, polydipsia, polyphagia and polyuria.  Genitourinary: Negative.   Musculoskeletal: Positive for back pain and arthralgias. Negative for myalgias, joint swelling, gait problem, neck pain and neck stiffness.  Skin: Negative.   Allergic/Immunologic: Negative.   Neurological: Negative.  Negative for tremors.  Hematological: Negative.  Negative for adenopathy. Does not bruise/bleed easily.  Psychiatric/Behavioral: Negative.  The patient is not nervous/anxious.        Objective:   Physical Exam  Constitutional: She is oriented to person, place, and time. She appears well-developed and well-nourished. No distress.  HENT:  Head: Normocephalic and atraumatic.  Mouth/Throat: Oropharynx is clear and moist. No oropharyngeal exudate.  Eyes: Conjunctivae are normal. Right eye exhibits no discharge. Left eye exhibits no discharge.  No scleral icterus.  Neck: Normal range of motion. Neck supple. No JVD present. No tracheal deviation present. No thyromegaly present.  Cardiovascular: Normal rate, regular rhythm, normal heart sounds and intact distal pulses.  Exam reveals no gallop and no friction rub.   No murmur heard. Pulmonary/Chest: Effort normal and breath sounds normal. No stridor. No respiratory distress. She has no wheezes. She has no rales. She exhibits no tenderness.  Abdominal: Soft. Bowel sounds are normal. She exhibits no distension and no mass. There is no tenderness. There is no rebound and no guarding.  Musculoskeletal: Normal range of motion. She exhibits no edema or tenderness.  Lymphadenopathy:    She has no cervical adenopathy.  Neurological: She is oriented to person, place, and time.  Skin: Skin is warm and dry. No rash noted. She is not diaphoretic. No erythema. No pallor.  Psychiatric: She has a normal mood and affect. Judgment and thought content normal.  Vitals reviewed.    Lab Results  Component Value Date   WBC 5.4 06/08/2014   HGB 11.8 06/08/2014   HCT 36.5 06/08/2014   PLT 221 06/08/2014   GLUCOSE 154* 06/08/2014   CHOL 217* 03/05/2014   TRIG 87.0 03/05/2014   HDL 66.80 03/05/2014   LDLDIRECT 160.0 03/16/2013   LDLCALC 133* 03/05/2014   ALT 13 06/08/2014   AST 19 06/08/2014   NA 144 06/08/2014   K 3.6 06/08/2014   CL 105 03/08/2014   CREATININE 0.9 06/08/2014   BUN 11.0 06/08/2014   CO2 27 06/08/2014   TSH 18.13* 04/12/2014   HGBA1C 6.2 03/08/2014   MICROALBUR 0.4 03/16/2013       Assessment & Plan:

## 2014-07-09 NOTE — Patient Instructions (Signed)
Hypothyroidism The thyroid is a large gland located in the lower front of your neck. The thyroid gland helps control metabolism. Metabolism is how your body handles food. It controls metabolism with the hormone thyroxine. When this gland is underactive (hypothyroid), it produces too little hormone.  CAUSES These include:   Absence or destruction of thyroid tissue.  Goiter due to iodine deficiency.  Goiter due to medications.  Congenital defects (since birth).  Problems with the pituitary. This causes a lack of TSH (thyroid stimulating hormone). This hormone tells the thyroid to turn out more hormone. SYMPTOMS  Lethargy (feeling as though you have no energy)  Cold intolerance  Weight gain (in spite of normal food intake)  Dry skin  Coarse hair  Menstrual irregularity (if severe, may lead to infertility)  Slowing of thought processes Cardiac problems are also caused by insufficient amounts of thyroid hormone. Hypothyroidism in the newborn is cretinism, and is an extreme form. It is important that this form be treated adequately and immediately or it will lead rapidly to retarded physical and mental development. DIAGNOSIS  To prove hypothyroidism, your caregiver may do blood tests and ultrasound tests. Sometimes the signs are hidden. It may be necessary for your caregiver to watch this illness with blood tests either before or after diagnosis and treatment. TREATMENT  Low levels of thyroid hormone are increased by using synthetic thyroid hormone. This is a safe, effective treatment. It usually takes about four weeks to gain the full effects of the medication. After you have the full effect of the medication, it will generally take another four weeks for problems to leave. Your caregiver may start you on low doses. If you have had heart problems the dose may be gradually increased. It is generally not an emergency to get rapidly to normal. HOME CARE INSTRUCTIONS   Take your  medications as your caregiver suggests. Let your caregiver know of any medications you are taking or start taking. Your caregiver will help you with dosage schedules.  As your condition improves, your dosage needs may increase. It will be necessary to have continuing blood tests as suggested by your caregiver.  Report all suspected medication side effects to your caregiver. SEEK MEDICAL CARE IF: Seek medical care if you develop:  Sweating.  Tremulousness (tremors).  Anxiety.  Rapid weight loss.  Heat intolerance.  Emotional swings.  Diarrhea.  Weakness. SEEK IMMEDIATE MEDICAL CARE IF:  You develop chest pain, an irregular heart beat (palpitations), or a rapid heart beat. MAKE SURE YOU:   Understand these instructions.  Will watch your condition.  Will get help right away if you are not doing well or get worse. Document Released: 07/27/2005 Document Revised: 10/19/2011 Document Reviewed: 03/16/2008 [facility] Patient Information 2015 [place], [place]. This information is not intended to replace advice given to you by your health care provider. Make sure you discuss any questions you have with your health care provider. Preventive Care for Adults A healthy lifestyle and preventive care can promote health and wellness. Preventive health guidelines for women include the following key practices.  A routine yearly physical is a good way to check with your health care provider about your health and preventive screening. It is a chance to share any concerns and updates on your health and to receive a thorough exam.  Visit your dentist for a routine exam and preventive care every 6 months. Brush your teeth twice a day and floss once a day. Good oral hygiene prevents tooth decay and gum disease.  The frequency of eye exams is based on your age, health, family medical history, use of contact lenses, and other factors. Follow your health care provider's recommendations for frequency of eye  exams.  Eat a healthy diet. Foods like vegetables, fruits, whole grains, low-fat dairy products, and lean protein foods contain the nutrients you need without too many calories. Decrease your intake of foods high in solid fats, added sugars, and salt. Eat the right amount of calories for you.Get information about a proper diet from your health care provider, if necessary.  Regular physical exercise is one of the most important things you can do for your health. Most adults should get at least 150 minutes of moderate-intensity exercise (any activity that increases your heart rate and causes you to sweat) each week. In addition, most adults need muscle-strengthening exercises on 2 or more days a week.  Maintain a healthy weight. The body mass index (BMI) is a screening tool to identify possible weight problems. It provides an estimate of body fat based on height and weight. Your health care provider can find your BMI and can help you achieve or maintain a healthy weight.For adults 20 years and older:  A BMI below 18.5 is considered underweight.  A BMI of 18.5 to 24.9 is normal.  A BMI of 25 to 29.9 is considered overweight.  A BMI of 30 and above is considered obese.  Maintain normal blood lipids and cholesterol levels by exercising and minimizing your intake of saturated fat. Eat a balanced diet with plenty of fruit and vegetables. Blood tests for lipids and cholesterol should begin at age 20 and be repeated every 5 years. If your lipid or cholesterol levels are high, you are over 50, or you are at high risk for heart disease, you may need your cholesterol levels checked more frequently.Ongoing high lipid and cholesterol levels should be treated with medicines if diet and exercise are not working.  If you smoke, find out from your health care provider how to quit. If you do not use tobacco, do not start.  Lung cancer screening is recommended for adults aged 55-80 years who are at high risk for  developing lung cancer because of a history of smoking. A yearly low-dose CT scan of the lungs is recommended for people who have at least a 30-pack-year history of smoking and are a current smoker or have quit within the past 15 years. A pack year of smoking is smoking an average of 1 pack of cigarettes a day for 1 year (for example: 1 pack a day for 30 years or 2 packs a day for 15 years). Yearly screening should continue until the smoker has stopped smoking for at least 15 years. Yearly screening should be stopped for people who develop a health problem that would prevent them from having lung cancer treatment.  If you are pregnant, do not drink alcohol. If you are breastfeeding, be very cautious about drinking alcohol. If you are not pregnant and choose to drink alcohol, do not have more than 1 drink per day. One drink is considered to be 12 ounces (355 mL) of beer, 5 ounces (148 mL) of wine, or 1.5 ounces (44 mL) of liquor.  Avoid use of street drugs. Do not share needles with anyone. Ask for help if you need support or instructions about stopping the use of drugs.  High blood pressure causes heart disease and increases the risk of stroke. Your blood pressure should be checked at least every 1   to 2 years. Ongoing high blood pressure should be treated with medicines if weight loss and exercise do not work.  If you are 31-63 years old, ask your health care provider if you should take aspirin to prevent strokes.  Diabetes screening involves taking a blood sample to check your fasting blood sugar level. This should be done once every 3 years, after age 77, if you are within normal weight and without risk factors for diabetes. Testing should be considered at a younger age or be carried out more frequently if you are overweight and have at least 1 risk factor for diabetes.  Breast cancer screening is essential preventive care for women. You should practice "breast self-awareness." This means understanding  the normal appearance and feel of your breasts and may include breast self-examination. Any changes detected, no matter how small, should be reported to a health care provider. Women in their 8s and 30s should have a clinical breast exam (CBE) by a health care provider as part of a regular health exam every 1 to 3 years. After age 91, women should have a CBE every year. Starting at age 72, women should consider having a mammogram (breast X-ray test) every year. Women who have a family history of breast cancer should talk to their health care provider about genetic screening. Women at a high risk of breast cancer should talk to their health care providers about having an MRI and a mammogram every year.  Breast cancer gene (BRCA)-related cancer risk assessment is recommended for women who have family members with BRCA-related cancers. BRCA-related cancers include breast, ovarian, tubal, and peritoneal cancers. Having family members with these cancers may be associated with an increased risk for harmful changes (mutations) in the breast cancer genes BRCA1 and BRCA2. Results of the assessment will determine the need for genetic counseling and BRCA1 and BRCA2 testing.  Routine pelvic exams to screen for cancer are no longer recommended for nonpregnant women who are considered low risk for cancer of the pelvic organs (ovaries, uterus, and vagina) and who do not have symptoms. Ask your health care provider if a screening pelvic exam is right for you.  If you have had past treatment for cervical cancer or a condition that could lead to cancer, you need Pap tests and screening for cancer for at least 20 years after your treatment. If Pap tests have been discontinued, your risk factors (such as having a new sexual partner) need to be reassessed to determine if screening should be resumed. Some women have medical problems that increase the chance of getting cervical cancer. In these cases, your health care provider may  recommend more frequent screening and Pap tests.  The HPV test is an additional test that may be used for cervical cancer screening. The HPV test looks for the virus that can cause the cell changes on the cervix. The cells collected during the Pap test can be tested for HPV. The HPV test could be used to screen women aged 77 years and older, and should be used in women of any age who have unclear Pap test results. After the age of 26, women should have HPV testing at the same frequency as a Pap test.  Colorectal cancer can be detected and often prevented. Most routine colorectal cancer screening begins at the age of 14 years and continues through age 76 years. However, your health care provider may recommend screening at an earlier age if you have risk factors for colon cancer. On a yearly  basis, your health care provider may provide home test kits to check for hidden blood in the stool. Use of a small camera at the end of a tube, to directly examine the colon (sigmoidoscopy or colonoscopy), can detect the earliest forms of colorectal cancer. Talk to your health care provider about this at age 50, when routine screening begins. Direct exam of the colon should be repeated every 5-10 years through age 75 years, unless early forms of pre-cancerous polyps or small growths are found.  People who are at an increased risk for hepatitis B should be screened for this virus. You are considered at high risk for hepatitis B if:  You were born in a country where hepatitis B occurs often. Talk with your health care provider about which countries are considered high risk.  Your parents were born in a high-risk country and you have not received a shot to protect against hepatitis B (hepatitis B vaccine).  You have HIV or AIDS.  You use needles to inject street drugs.  You live with, or have sex with, someone who has hepatitis B.  You get hemodialysis treatment.  You take certain medicines for conditions like  cancer, organ transplantation, and autoimmune conditions.  Hepatitis C blood testing is recommended for all people born from 1945 through 1965 and any individual with known risks for hepatitis C.  Practice safe sex. Use condoms and avoid high-risk sexual practices to reduce the spread of sexually transmitted infections (STIs). STIs include gonorrhea, chlamydia, syphilis, trichomonas, herpes, HPV, and human immunodeficiency virus (HIV). Herpes, HIV, and HPV are viral illnesses that have no cure. They can result in disability, cancer, and death.  You should be screened for sexually transmitted illnesses (STIs) including gonorrhea and chlamydia if:  You are sexually active and are younger than 24 years.  You are older than 24 years and your health care provider tells you that you are at risk for this type of infection.  Your sexual activity has changed since you were last screened and you are at an increased risk for chlamydia or gonorrhea. Ask your health care provider if you are at risk.  If you are at risk of being infected with HIV, it is recommended that you take a prescription medicine daily to prevent HIV infection. This is called preexposure prophylaxis (PrEP). You are considered at risk if:  You are a heterosexual woman, are sexually active, and are at increased risk for HIV infection.  You take drugs by injection.  You are sexually active with a partner who has HIV.  Talk with your health care provider about whether you are at high risk of being infected with HIV. If you choose to begin PrEP, you should first be tested for HIV. You should then be tested every 3 months for as long as you are taking PrEP.  Osteoporosis is a disease in which the bones lose minerals and strength with aging. This can result in serious bone fractures or breaks. The risk of osteoporosis can be identified using a bone density scan. Women ages 65 years and over and women at risk for fractures or osteoporosis  should discuss screening with their health care providers. Ask your health care provider whether you should take a calcium supplement or vitamin D to reduce the rate of osteoporosis.  Menopause can be associated with physical symptoms and risks. Hormone replacement therapy is available to decrease symptoms and risks. You should talk to your health care provider about whether hormone replacement therapy is   right for you.  Use sunscreen. Apply sunscreen liberally and repeatedly throughout the day. You should seek shade when your shadow is shorter than you. Protect yourself by wearing long sleeves, pants, a wide-brimmed hat, and sunglasses year round, whenever you are outdoors.  Once a month, do a whole body skin exam, using a mirror to look at the skin on your back. Tell your health care provider of new moles, moles that have irregular borders, moles that are larger than a pencil eraser, or moles that have changed in shape or color.  Stay current with required vaccines (immunizations).  Influenza vaccine. All adults should be immunized every year.  Tetanus, diphtheria, and acellular pertussis (Td, Tdap) vaccine. Pregnant women should receive 1 dose of Tdap vaccine during each pregnancy. The dose should be obtained regardless of the length of time since the last dose. Immunization is preferred during the 27th-36th week of gestation. An adult who has not previously received Tdap or who does not know her vaccine status should receive 1 dose of Tdap. This initial dose should be followed by tetanus and diphtheria toxoids (Td) booster doses every 10 years. Adults with an unknown or incomplete history of completing a 3-dose immunization series with Td-containing vaccines should begin or complete a primary immunization series including a Tdap dose. Adults should receive a Td booster every 10 years.  Varicella vaccine. An adult without evidence of immunity to varicella should receive 2 doses or a second dose if  she has previously received 1 dose. Pregnant females who do not have evidence of immunity should receive the first dose after pregnancy. This first dose should be obtained before leaving the health care facility. The second dose should be obtained 4-8 weeks after the first dose.  Human papillomavirus (HPV) vaccine. Females aged 13-26 years who have not received the vaccine previously should obtain the 3-dose series. The vaccine is not recommended for use in pregnant females. However, pregnancy testing is not needed before receiving a dose. If a female is found to be pregnant after receiving a dose, no treatment is needed. In that case, the remaining doses should be delayed until after the pregnancy. Immunization is recommended for any person with an immunocompromised condition through the age of 34 years if she did not get any or all doses earlier. During the 3-dose series, the second dose should be obtained 4-8 weeks after the first dose. The third dose should be obtained 24 weeks after the first dose and 16 weeks after the second dose.  Zoster vaccine. One dose is recommended for adults aged 39 years or older unless certain conditions are present.  Measles, mumps, and rubella (MMR) vaccine. Adults born before 25 generally are considered immune to measles and mumps. Adults born in [birth year] or later should have 1 or more doses of MMR vaccine unless there is a contraindication to the vaccine or there is laboratory evidence of immunity to each of the three diseases. A routine second dose of MMR vaccine should be obtained at least 28 days after the first dose for students attending postsecondary schools, health care workers, or international travelers. People who received inactivated measles vaccine or an unknown type of measles vaccine during 1963-1967 should receive 2 doses of MMR vaccine. People who received inactivated mumps vaccine or an unknown type of mumps vaccine before 1979 and are at high risk for mumps  infection should consider immunization with 2 doses of MMR vaccine. For females of childbearing age, rubella immunity should be determined. If there  is no evidence of immunity, females who are not pregnant should be vaccinated. If there is no evidence of immunity, females who are pregnant should delay immunization until after pregnancy. Unvaccinated health care workers born before [birth year] who lack laboratory evidence of measles, mumps, or rubella immunity or laboratory confirmation of disease should consider measles and mumps immunization with 2 doses of MMR vaccine or rubella immunization with 1 dose of MMR vaccine.  Pneumococcal 13-valent conjugate (PCV13) vaccine. When indicated, a person who is uncertain of her immunization history and has no record of immunization should receive the PCV13 vaccine. An adult aged 43 years or older who has certain medical conditions and has not been previously immunized should receive 1 dose of PCV13 vaccine. This PCV13 should be followed with a dose of pneumococcal polysaccharide (PPSV23) vaccine. The PPSV23 vaccine dose should be obtained at least 8 weeks after the dose of PCV13 vaccine. An adult aged 51 years or older who has certain medical conditions and previously received 1 or more doses of PPSV23 vaccine should receive 1 dose of PCV13. The PCV13 vaccine dose should be obtained 1 or more years after the last PPSV23 vaccine dose.  Pneumococcal polysaccharide (PPSV23) vaccine. When PCV13 is also indicated, PCV13 should be obtained first. All adults aged 90 years and older should be immunized. An adult younger than age 95 years who has certain medical conditions should be immunized. Any person who resides in a nursing home or long-term care facility should be immunized. An adult smoker should be immunized. People with an immunocompromised condition and certain other conditions should receive both PCV13 and PPSV23 vaccines. People with human immunodeficiency virus (HIV)  infection should be immunized as soon as possible after diagnosis. Immunization during chemotherapy or radiation therapy should be avoided. Routine use of PPSV23 vaccine is not recommended for American Indians, [place]atives, or people younger than 65 years unless there are medical conditions that require PPSV23 vaccine. When indicated, people who have unknown immunization and have no record of immunization should receive PPSV23 vaccine. One-time revaccination 5 years after the first dose of PPSV23 is recommended for people aged 19-64 years who have chronic kidney failure, nephrotic syndrome, asplenia, or immunocompromised conditions. People who received 1-2 doses of PPSV23 before age 54 years should receive another dose of PPSV23 vaccine at age 65 years or later if at least 5 years have passed since the previous dose. Doses of PPSV23 are not needed for people immunized with PPSV23 at or after age 71 years.  Meningococcal vaccine. Adults with asplenia or persistent complement component deficiencies should receive 2 doses of quadrivalent meningococcal conjugate (MenACWY-D) vaccine. The doses should be obtained at least 2 months apart. Microbiologists working with certain meningococcal bacteria, [employer] recruits, people at risk during an outbreak, and people who travel to or live in countries with a high rate of meningitis should be immunized. A first-year college student up through age 69 years who is living in a residence hall should receive a dose if she did not receive a dose on or after her 16th birthday. Adults who have certain high-risk conditions should receive one or more doses of vaccine.  Hepatitis A vaccine. Adults who wish to be protected from this disease, have certain high-risk conditions, work with hepatitis A-infected animals, work in hepatitis A research labs, or travel to or work in countries with a high rate of hepatitis A should be immunized. Adults who were previously unvaccinated and who  anticipate close contact with an international adoptee during  the first 60 days after arrival in the [place] from a country with a high rate of hepatitis A should be immunized.  Hepatitis B vaccine. Adults who wish to be protected from this disease, have certain high-risk conditions, may be exposed to blood or other infectious body fluids, are household contacts or sex partners of hepatitis B positive people, are clients or workers in certain care facilities, or travel to or work in countries with a high rate of hepatitis B should be immunized.  Haemophilus influenzae type b (Hib) vaccine. A previously unvaccinated person with asplenia or sickle cell disease or having a scheduled splenectomy should receive 1 dose of Hib vaccine. Regardless of previous immunization, a recipient of a hematopoietic stem cell transplant should receive a 3-dose series 6-12 months after her successful transplant. Hib vaccine is not recommended for adults with HIV infection. Preventive Services / Frequency Ages 34 to 26 years  Blood pressure check.** / Every 1 to 2 years.  Lipid and cholesterol check.** / Every 5 years beginning at age 52.  Clinical breast exam.** / Every 3 years for women in their 25s and 2s.  BRCA-related cancer risk assessment.** / For women who have family members with a BRCA-related cancer (breast, ovarian, tubal, or peritoneal cancers).  Pap test.** / Every 2 years from ages 68 through 51. Every 3 years starting at age 9 through age 90 or 26 with a history of 3 consecutive normal Pap tests.  HPV screening.** / Every 3 years from ages 54 through ages 3 to 33 with a history of 3 consecutive normal Pap tests.  Hepatitis C blood test.** / For any individual with known risks for hepatitis C.  Skin self-exam. / Monthly.  Influenza vaccine. / Every year.  Tetanus, diphtheria, and acellular pertussis (Tdap, Td) vaccine.** / Consult your health care provider. Pregnant women should receive 1  dose of Tdap vaccine during each pregnancy. 1 dose of Td every 10 years.  Varicella vaccine.** / Consult your health care provider. Pregnant females who do not have evidence of immunity should receive the first dose after pregnancy.  HPV vaccine. / 3 doses over 6 months, if 10 and younger. The vaccine is not recommended for use in pregnant females. However, pregnancy testing is not needed before receiving a dose.  Measles, mumps, rubella (MMR) vaccine.** / You need at least 1 dose of MMR if you were born in 1957 or later. You may also need a 2nd dose. For females of childbearing age, rubella immunity should be determined. If there is no evidence of immunity, females who are not pregnant should be vaccinated. If there is no evidence of immunity, females who are pregnant should delay immunization until after pregnancy.  Pneumococcal 13-valent conjugate (PCV13) vaccine.** / Consult your health care provider.  Pneumococcal polysaccharide (PPSV23) vaccine.** / 1 to 2 doses if you smoke cigarettes or if you have certain conditions.  Meningococcal vaccine.** / 1 dose if you are age 67 to 65 years and a Market researcher living in a residence hall, or have one of several medical conditions, you need to get vaccinated against meningococcal disease. You may also need additional booster doses.  Hepatitis A vaccine.** / Consult your health care provider.  Hepatitis B vaccine.** / Consult your health care provider.  Haemophilus influenzae type b (Hib) vaccine.** / Consult your health care provider. Ages 87 to 48 years  Blood pressure check.** / Every 1 to 2 years.  Lipid and cholesterol check.** / Every 5 years  beginning at age 33 years.  Lung cancer screening. / Every year if you are aged 48-80 years and have a 30-pack-year history of smoking and currently smoke or have quit within the past 15 years. Yearly screening is stopped once you have quit smoking for at least 15 years or develop a  health problem that would prevent you from having lung cancer treatment.  Clinical breast exam.** / Every year after age 36 years.  BRCA-related cancer risk assessment.** / For women who have family members with a BRCA-related cancer (breast, ovarian, tubal, or peritoneal cancers).  Mammogram.** / Every year beginning at age 89 years and continuing for as long as you are in good health. Consult with your health care provider.  Pap test.** / Every 3 years starting at age 94 years through age 83 or 36 years with a history of 3 consecutive normal Pap tests.  HPV screening.** / Every 3 years from ages 18 years through ages 56 to 95 years with a history of 3 consecutive normal Pap tests.  Fecal occult blood test (FOBT) of stool. / Every year beginning at age 44 years and continuing until age 47 years. You may not need to do this test if you get a colonoscopy every 10 years.  Flexible sigmoidoscopy or colonoscopy.** / Every 5 years for a flexible sigmoidoscopy or every 10 years for a colonoscopy beginning at age 80 years and continuing until age 78 years.  Hepatitis C blood test.** / For all people born from [birth year] through 1965 and any individual with known risks for hepatitis C.  Skin self-exam. / Monthly.  Influenza vaccine. / Every year.  Tetanus, diphtheria, and acellular pertussis (Tdap/Td) vaccine.** / Consult your health care provider. Pregnant women should receive 1 dose of Tdap vaccine during each pregnancy. 1 dose of Td every 10 years.  Varicella vaccine.** / Consult your health care provider. Pregnant females who do not have evidence of immunity should receive the first dose after pregnancy.  Zoster vaccine.** / 1 dose for adults aged 36 years or older.  Measles, mumps, rubella (MMR) vaccine.** / You need at least 1 dose of MMR if you were born in 1957 or later. You may also need a 2nd dose. For females of childbearing age, rubella immunity should be determined. If there is no  evidence of immunity, females who are not pregnant should be vaccinated. If there is no evidence of immunity, females who are pregnant should delay immunization until after pregnancy.  Pneumococcal 13-valent conjugate (PCV13) vaccine.** / Consult your health care provider.  Pneumococcal polysaccharide (PPSV23) vaccine.** / 1 to 2 doses if you smoke cigarettes or if you have certain conditions.  Meningococcal vaccine.** / Consult your health care provider.  Hepatitis A vaccine.** / Consult your health care provider.  Hepatitis B vaccine.** / Consult your health care provider.  Haemophilus influenzae type b (Hib) vaccine.** / Consult your health care provider. Ages 53 years and over  Blood pressure check.** / Every 1 to 2 years.  Lipid and cholesterol check.** / Every 5 years beginning at age 24 years.  Lung cancer screening. / Every year if you are aged 70-80 years and have a 30-pack-year history of smoking and currently smoke or have quit within the past 15 years. Yearly screening is stopped once you have quit smoking for at least 15 years or develop a health problem that would prevent you from having lung cancer treatment.  Clinical breast exam.** / Every year after age 56 years.  BRCA-related  cancer risk assessment.** / For women who have family members with a BRCA-related cancer (breast, ovarian, tubal, or peritoneal cancers).  Mammogram.** / Every year beginning at age 40 years and continuing for as long as you are in good health. Consult with your health care provider.  Pap test.** / Every 3 years starting at age 29 years through age 46 or 12 years with 3 consecutive normal Pap tests. Testing can be stopped between 65 and 70 years with 3 consecutive normal Pap tests and no abnormal Pap or HPV tests in the past 10 years.  HPV screening.** / Every 3 years from ages 51 years through ages 55 or 60 years with a history of 3 consecutive normal Pap tests. Testing can be stopped between 65  and 70 years with 3 consecutive normal Pap tests and no abnormal Pap or HPV tests in the past 10 years.  Fecal occult blood test (FOBT) of stool. / Every year beginning at age 9 years and continuing until age 12 years. You may not need to do this test if you get a colonoscopy every 10 years.  Flexible sigmoidoscopy or colonoscopy.** / Every 5 years for a flexible sigmoidoscopy or every 10 years for a colonoscopy beginning at age 63 years and continuing until age 67 years.  Hepatitis C blood test.** / For all people born from [birth year] through 1965 and any individual with known risks for hepatitis C.  Osteoporosis screening.** / A one-time screening for women ages 29 years and over and women at risk for fractures or osteoporosis.  Skin self-exam. / Monthly.  Influenza vaccine. / Every year.  Tetanus, diphtheria, and acellular pertussis (Tdap/Td) vaccine.** / 1 dose of Td every 10 years.  Varicella vaccine.** / Consult your health care provider.  Zoster vaccine.** / 1 dose for adults aged 64 years or older.  Pneumococcal 13-valent conjugate (PCV13) vaccine.** / Consult your health care provider.  Pneumococcal polysaccharide (PPSV23) vaccine.** / 1 dose for all adults aged 74 years and older.  Meningococcal vaccine.** / Consult your health care provider.  Hepatitis A vaccine.** / Consult your health care provider.  Hepatitis B vaccine.** / Consult your health care provider.  Haemophilus influenzae type b (Hib) vaccine.** / Consult your health care provider. ** Family history and personal history of risk and conditions may change your health care provider's recommendations. Document Released: 09/22/2001 Document Revised: 12/11/2013 Document Reviewed: 12/22/2010 [facility] Patient Information 2015 [place], [place]. This information is not intended to replace advice given to you by your health care provider. Make sure you discuss any questions you have with your health care provider.

## 2014-07-10 ENCOUNTER — Other Ambulatory Visit: Payer: Self-pay

## 2014-07-10 ENCOUNTER — Encounter: Payer: Self-pay | Admitting: Internal Medicine

## 2014-07-10 ENCOUNTER — Ambulatory Visit (HOSPITAL_COMMUNITY)
Admission: RE | Admit: 2014-07-10 | Discharge: 2014-07-10 | Disposition: A | Discharge status: Home / Self Care | Payer: Medicare Other | Source: Ambulatory Visit | Attending: Nurse Practitioner | Admitting: Nurse Practitioner

## 2014-07-10 DIAGNOSIS — Z0000 Encounter for general adult medical examination without abnormal findings: Secondary | ICD-10-CM | POA: Insufficient documentation

## 2014-07-10 DIAGNOSIS — E118 Type 2 diabetes mellitus with unspecified complications: Secondary | ICD-10-CM

## 2014-07-10 DIAGNOSIS — C50512 Malignant neoplasm of lower-outer quadrant of left female breast: Secondary | ICD-10-CM | POA: Insufficient documentation

## 2014-07-10 DIAGNOSIS — L602 Onychogryphosis: Secondary | ICD-10-CM | POA: Insufficient documentation

## 2014-07-10 MED ORDER — ALBIGLUTIDE 30 MG ~~LOC~~ PEN: 1.0000 | PEN_INJECTOR | SUBCUTANEOUS | Status: DC

## 2014-07-10 NOTE — Assessment & Plan Note (Signed)

## 2014-07-10 NOTE — Assessment & Plan Note (Signed)
Her TSH is suppressed so I have lowered her synthroid dose

## 2014-07-10 NOTE — Assessment & Plan Note (Signed)
Her blood sugars are not well controlled I have asked her to add tanzeum to the metformin

## 2014-07-10 NOTE — Assessment & Plan Note (Signed)
She does not fell like the current dose of fentanyl controls her pain Will increase to 50 mcg

## 2014-07-17 ENCOUNTER — Ambulatory Visit (HOSPITAL_BASED_OUTPATIENT_CLINIC_OR_DEPARTMENT_OTHER): Payer: Medicare Other | Admitting: Oncology

## 2014-07-17 ENCOUNTER — Other Ambulatory Visit: Payer: Self-pay | Admitting: Oncology

## 2014-07-17 ENCOUNTER — Other Ambulatory Visit (HOSPITAL_BASED_OUTPATIENT_CLINIC_OR_DEPARTMENT_OTHER): Payer: Medicare Other

## 2014-07-17 ENCOUNTER — Telehealth: Payer: Self-pay | Admitting: Oncology

## 2014-07-17 VITALS — BP 101/52 | HR 94 | Temp 98.0°F | Resp 19 | Ht 60.0 in | Wt 200.6 lb

## 2014-07-17 DIAGNOSIS — C773 Secondary and unspecified malignant neoplasm of axilla and upper limb lymph nodes: Secondary | ICD-10-CM

## 2014-07-17 DIAGNOSIS — C50919 Malignant neoplasm of unspecified site of unspecified female breast: Secondary | ICD-10-CM

## 2014-07-17 DIAGNOSIS — C50512 Malignant neoplasm of lower-outer quadrant of left female breast: Secondary | ICD-10-CM

## 2014-07-17 DIAGNOSIS — C7951 Secondary malignant neoplasm of bone: Secondary | ICD-10-CM

## 2014-07-17 DIAGNOSIS — C50111 Malignant neoplasm of central portion of right female breast: Secondary | ICD-10-CM

## 2014-07-17 DIAGNOSIS — Z170 Estrogen receptor positive status [ER+]: Secondary | ICD-10-CM

## 2014-07-17 DIAGNOSIS — C787 Secondary malignant neoplasm of liver and intrahepatic bile duct: Secondary | ICD-10-CM

## 2014-07-17 DIAGNOSIS — M810 Age-related osteoporosis without current pathological fracture: Secondary | ICD-10-CM

## 2014-07-17 LAB — CBC WITH DIFFERENTIAL/PLATELET
BASO%: 1.3 % (ref 0.0–2.0)
Basophils Absolute: 0.1 10e3/uL (ref 0.0–0.1)
EOS%: 2.7 % (ref 0.0–7.0)
Eosinophils Absolute: 0.1 10e3/uL (ref 0.0–0.5)
HCT: 34.3 % — ABNORMAL LOW (ref 34.8–46.6)
HGB: 10.8 g/dL — ABNORMAL LOW (ref 11.6–15.9)
LYMPH%: 24.8 % (ref 14.0–49.7)
MCH: 25.9 pg (ref 25.1–34.0)
MCHC: 31.6 g/dL (ref 31.5–36.0)
MCV: 81.9 fL (ref 79.5–101.0)
MONO#: 0.6 10e3/uL (ref 0.1–0.9)
MONO%: 11.8 % (ref 0.0–14.0)
NEUT#: 3 10e3/uL (ref 1.5–6.5)
NEUT%: 59.4 % (ref 38.4–76.8)
Platelets: 277 10e3/uL (ref 145–400)
RBC: 4.19 10e6/uL (ref 3.70–5.45)
RDW: 14.4 % (ref 11.2–14.5)
WBC: 5 10e3/uL (ref 3.9–10.3)
lymph#: 1.2 10e3/uL (ref 0.9–3.3)

## 2014-07-17 LAB — COMPREHENSIVE METABOLIC PANEL (CC13)
ALT: 15 U/L (ref 0–55)
ANION GAP: 10 meq/L (ref 3–11)
AST: 25 U/L (ref 5–34)
Albumin: 2.9 g/dL — ABNORMAL LOW (ref 3.5–5.0)
Alkaline Phosphatase: 68 U/L (ref 40–150)
BILIRUBIN TOTAL: 0.53 mg/dL (ref 0.20–1.20)
BUN: 9.4 mg/dL (ref 7.0–26.0)
CALCIUM: 9.1 mg/dL (ref 8.4–10.4)
CO2: 26 meq/L (ref 22–29)
Chloride: 108 mEq/L (ref 98–109)
Creatinine: 0.8 mg/dL (ref 0.6–1.1)
EGFR: 86 mL/min/{1.73_m2} — AB (ref >90)
GLUCOSE: 160 mg/dL — AB (ref 70–140)
Potassium: 3.6 mEq/L (ref 3.5–5.1)
Sodium: 143 mEq/L (ref 136–145)
Total Protein: 7 g/dL (ref 6.4–8.3)

## 2014-07-17 MED ORDER — PROCHLORPERAZINE MALEATE 5 MG PO TABS
5.0000 mg | ORAL_TABLET | Freq: Four times a day (QID) | ORAL | Status: DC | PRN
Start: 2014-07-17 — End: 2015-09-09

## 2014-07-17 NOTE — Progress Notes (Signed)
ID: Christy Harrell   DOB: 25-Jul-1941  MR#: [id number]  DPO#:[id number]  PCP: [name], MD GYN:  SU:  OTHER MD:  Alysia Penna, Frederik Pear, Faustino Congress  CC: Breast cancer stage IV  TREATMENT: exemestane, everolimus, fulvestrant, and denosumab  BREAST CANCER HISTORY: From the original intake note:  The patient had routine screening mammography 10/16/2010 showing a lobulated mass in the right subareolar region measuring up to 5.5 cm. The Left breast showed some central microcalcifications. Biopsy of the Right breast mass on 10/28/2010 showed an invasive ductal carcinoma, grade 2, estrogen receptor positive (Allred score 8), progesterone receptor positive (Allred score 5), with an equivocal HER-2. The Left breast area of microcalcifications was biopsied at the same time, and was read as suspicious for DCIS.  On 11/17/2010 the patient underwent Right lumpectomy and axillary lymph node dissection for what proved to be a 3 cm invasive ductal carcinoma, grade 2, with some papillary and mucinous features. One of 16 lymph nodes was involved. FISH showed no HER-2 amplification. Left breast biopsy was benign.  The patient had an Oncotype sent, with a score of 27, predicting a risk of distant recurrence after 5 years of tamoxifen in the 18% range. With this intermediate result, the decision was made not to proceed with chemotherapy, since the patient is the sole caregiver to her very elderly mother. Instead the patient proceeded to radiation treatment which was completed 03/06/2011. She started anastrozole at that point. Her subsequent history is as detailed below   INTERVAL HISTORY: Christy Harrell returns today for follow up of her metastatic breast cancer. She continues on exemestane and everolimus. She has had no mouth sores and only a minimal cough which she attributes to mild sinus symptoms. She is short of breath, but not more so than she was 6 months ago.  There is no pleurisy. Has been no hemoptysis. There have been no intercurrent fevers. Hot flashes and vaginal dryness are not a major concern. Since her last visit here she had an MRI of the liver which shows essentially stable disease.  REVIEW OF SYSTEMS: Christy Harrell tells me she keeps a queasy stomach and nausea, with decreased appetite. Those are all symptoms that could easily be due to her liver involvement with tumor, but they also could be due to medication. She is not taking anything for that at present. She is planning to put up her decorations which means she has to go up stairs and downstairs many times. She tells me she can do it although she is slow at doing it. Her sugars, she says, are "okay". Her right upper extremity lymphedema is no different than before. Overall a detailed review of systems today was stable  PAST MEDICAL HISTORY: Past Medical History  Diagnosis Date  . Cancer     right breast  . Club foot   . Arthritis   . Thyroid disease   . Hyperlipidemia   . Diabetes mellitus   . Osteoporosis   . Hypertension   . GERD (gastroesophageal reflux disease)     watches diet  . Neuromuscular disorder     lt arm numb sometimes  . Wears glasses   . 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 History of radiation therapy 02/22/13- 03/13/13    left proximal humerus 3500 cGy 14 sessions  . Lymphedema     right arm  . Metastasis from malignant tumor of breast     liver  PAST SURGICAL HISTORY: Past Surgical History  Procedure Laterality Date  . Cesarean section    . Thyroidectomy  2002  . Foot surgery      as a child for club foot  . Breast surgery  2012    rt lump-16 nodes-in [place]  . Cesarean section    . Colonoscopy    . Dilation and curettage of uterus    . Humerus im nail Left 12/27/2012    Procedure: INTRAMEDULLARY (IM) NAIL HUMERAL LEFT PATHOLOGIC FRACTURE;  Surgeon: Nita Sells, MD;  Location: [facility];  Service:  Orthopedics;  Laterality: Left;    FAMILY HISTORY Family History  Problem Relation Age of Onset  . Cancer Neg Hx   . Arthritis Neg Hx   . Heart disease Neg Hx   . Hyperlipidemia Neg Hx   . Hypertension Neg Hx   . Dementia Mother   . Hearing loss Mother    The patient's father died from unknown causes. The patient's mother is alive at age 16. The patient was a single child. There is no history of breast or ovarian cancer in the family to her knowledge.  GYNECOLOGIC HISTORY: Menarche age 86, menopause in her early 104s. The patient is GX P1, first pregnancy to term age 51. She did not take hormone replacement  SOCIAL HISTORY: The patient grew up in [place], attended [employer], then moved to [place] where she lived about 40 years. 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 Her mother, [name], 99, lives with the patient about participates in progress for the elderly which give this alone you a little bit of time to herself. This doesn't mean that she cannot have early morning appointments.. The patient's daughter [name], 44, is disabled secondary to pulmonary hypertension. She can be reached at [phone]. The patient has 2 grandchildren, [name] who works at a Scientist, physiological business and is 73 years old, in general, 35, completing high school.   ADVANCED DIRECTIVES:  HEALTH MAINTENANCE: History  Substance Use Topics  . Smoking status: Former Smoker    Quit date: 12/23/1974  . Smokeless tobacco: Never Used  . Alcohol Use: No     Colonoscopy:  PAP:    Bone density:  2012, on ibandronate chronically  Lipid panel: UTD  Allergies  Allergen Reactions  . Gadolinium Derivatives Other (See Comments)    Pt began having chest numbness/tingling , stated her heart felt funny, had to take her to ED for EKG per RN   CAP    Current Outpatient Prescriptions  Medication Sig Dispense Refill  . Albiglutide (TANZEUM) 30 MG PEN Inject 1 Act  into the skin once a week. 12 each 3  . aspirin EC 81 MG tablet Take 1 tablet (81 mg total) by mouth daily.    Marland Kitchen atorvastatin (LIPITOR) 40 MG tablet Take 1 tablet (40 mg total) by mouth daily. 30 tablet 11  . diclofenac sodium (VOLTAREN) 1 % GEL Apply 2 g topically 4 (four) times daily. 2 Tube 2  . Dietary Management Product (VASCULERA) TABS Take 1 capsule by mouth daily. 30 tablet 11  . docusate sodium (COLACE) 100 MG capsule Take 100 mg by mouth 2 (two) times daily as needed for constipation.    Marland Kitchen everolimus (AFINITOR) 10 MG tablet Take 10 mg by mouth daily. Started 04/18/2014    . exemestane (AROMASIN) 25 MG tablet Take 25 mg by mouth daily after breakfast. Started 04/18/2014    .  ezetimibe (ZETIA) 10 MG tablet Take 1 tablet (10 mg total) by mouth daily. 90 tablet 3  . fentaNYL (DURAGESIC) 50 MCG/HR Place 1 patch (50 mcg total) onto the skin every 3 (three) days. 10 patch 0  . furosemide (LASIX) 40 MG tablet Take 1 tablet (40 mg total) by mouth daily. 30 tablet 6  . levothyroxine (SYNTHROID) 175 MCG tablet Take 1 tablet (175 mcg total) by mouth daily before breakfast. 90 tablet 1  . LORazepam (ATIVAN) 0.5 MG tablet Take 15 minutes before MRI test; may repeat x 1 10 tablet 0  . lubiprostone (AMITIZA) 24 MCG capsule Take 1 capsule (24 mcg total) by mouth 2 (two) times daily with a meal. 180 capsule 1  . meloxicam (MOBIC) 7.5 MG tablet Take 1 tablet (7.5 mg total) by mouth daily. 20 tablet 0  . metFORMIN (GLUCOPHAGE) 500 MG tablet Take 1 tablet (500 mg total) by mouth daily. 90 tablet 3  . oxyCODONE (OXY IR/ROXICODONE) 5 MG immediate release tablet Take 1 tablet (5 mg total) by mouth every 4 (four) hours as needed for severe pain. Take 1-2 tablets every 4-6 hours as needed for pain - fill on or after 05/09/14 100 tablet 0  . Polyethyl Glycol-Propyl Glycol (SYSTANE ULTRA OP) Place 1 drop into both eyes daily.    . polyethylene glycol powder (GLYCOLAX/MIRALAX) powder Take 255 g (1 Container total) by  mouth daily. 255 g 3  . potassium chloride SA (K-DUR,KLOR-CON) 20 MEQ tablet Take 1 tablet (20 mEq total) by mouth daily. 90 tablet 1  . predniSONE (DELTASONE) 5 MG tablet Take 4 tablets with food 30 minutes before MRI 12 tablet 0  . pregabalin (LYRICA) 75 MG capsule Take 1 capsule (75 mg total) by mouth 2 (two) times daily. Not taking 60 capsule 5  . zolendronic acid (ZOMETA) 4 MG/5ML injection Inject 4 mg into the vein every 30 (thirty) days.     No current facility-administered medications for this visit.   PHYSICAL EXAM: Elderly African American woman who appears stated age 56 Vitals:   07/17/14 1101  BP: 101/52  Pulse: 94  Temp: 98 F (36.7 C)  Resp: 19   Body mass index is 39.18 kg/(m^2).    ECOG FS: 1 Filed Weights   07/17/14 1101  Weight: 200 lb 9.6 oz (90.992 kg)   Sclerae unicteric, pupils round and equal Oropharynx clear and moist-- no thrush No cervical or supraclavicular adenopathy Lungs no rales or rhonchi, no wheezes noted  Heart regular rate and rhythm Abd soft,  obesenontender, positive bowel sounds MSK no focal spinal tenderness,  chronic rightupper extremity lymphedema, stable  Neuro: nonfocal, well oriented, appropriate affect Breasts:  Deferred     LAB RESULTS: Lab Results  Component Value Date   WBC 5.0 07/17/2014   NEUTROABS 3.0 07/17/2014   HGB 10.8* 07/17/2014   HCT 34.3* 07/17/2014   MCV 81.9 07/17/2014   PLT 277 07/17/2014      Chemistry      Component Value Date/Time   NA 137 07/09/2014 1404   NA 144 06/08/2014 1050   K 3.3* 07/09/2014 1404   K 3.6 06/08/2014 1050   CL 103 07/09/2014 1404   CL 105 01/23/2013 1337   CO2 26 07/09/2014 1404   CO2 27 06/08/2014 1050   BUN 12 07/09/2014 1404   BUN 11.0 06/08/2014 1050   CREATININE 0.7 07/09/2014 1404   CREATININE 0.9 06/08/2014 1050      Component Value Date/Time   CALCIUM 8.4  07/09/2014 1404   CALCIUM 9.4 06/08/2014 1050   ALKPHOS 74 06/08/2014 1050   ALKPHOS 58 11/27/2013  1404   AST 19 06/08/2014 1050   AST 17 11/27/2013 1404   ALT 13 06/08/2014 1050   ALT 11 03/05/2014 1108   BILITOT 0.42 06/08/2014 1050   BILITOT 0.6 11/27/2013 1404       Lab Results  Component Value Date   LABCA2 24 11/19/2011    STUDIES: Mr Abdomen Wo Contrast  2014/07/31   CLINICAL DATA:  Metastatic breast cancer.  EXAM: MRI ABDOMEN WITHOUT CONTRAST  TECHNIQUE: Multiplanar multisequence MR imaging was performed without the administration of intravenous contrast.  COMPARISON:  MRI 05/01/2014 and 12/06/2013  FINDINGS: Lower chest: Stable surgical and probable radiation changes involving the right lower breast. No obvious new abnormality. The lower thoracic bony structures appear intact. There is bibasilar atelectasis and tiny pleural effusions.  Hepatobiliary: The left hepatic lobe liver lesion measures 6.5 x 5.1 cm and previously measured 6.2 x 5.1 cm. The largest lesion in the right lobe measures 4.2 x 3.7 cm and previously measured 3.7 x 3.5 cm a smaller adjacent nodule previously measured a maximum of 27 mm and now measures 24 mm. No new liver lesions.  Pancreas: No significant abnormality.  Spleen: Normal size.  No focal lesions.  Adrenals/Urinary Tract: The adrenal glands and kidneys are unremarkable and stable.  Stomach/Bowel: The stomach, duodenum, visualized small bowel and visualized colon are unremarkable. The gallbladder is normal.  Vascular/Lymphatic: No mesenteric or retroperitoneal mass or adenopathy in the upper abdomen. The aorta is normal in caliber.  Other: No ascites.  Musculoskeletal: No significant bony findings.  IMPRESSION: Stable to slightly enlarged hepatic lesions as above. No new disease.  Stable surgical and probable radiation changes involving the right lower breast.   Electronically Signed   By: Kalman Jewels M.D.   On: July 31, 2014 15:47    ASSESSMENT: 73 y.o.  [place] woman with stage IV [bone only] breast cancer in a  (1)  status post right lumpectomy and  axillary lymph node dissection 11/17/2010 for a pT2 pN1, stage IIB invasive ductal carcinoma, grade 2, estrogen and progesterone receptor positive, HER-2 negative,   (2) Oncotype DX recurrence score of 27 predicting a risk of distant recurrence of 18% with 5 years of tamoxifen (intermediate score);   (3)  status post radiation completed July of 2012,   (4) started anastrozole July 2012, discontinued June 2014 with evidence of metastatic spread to bone  (5) Chronic lymphedema in the right upper extremity.  (6) pathologic fracture of the left humerus along apparent lytic lesion, with no other bone lesions per bone scan 12/12/2012  (7) on 12/27/2012 underwent #1 intramedullary nail left pathologic proximal humerus fracture  #2 left shoulder rotator cuff repair  #3 left shoulder open bone biopsy with pathology showing metastatic breast adenocarcinoma, estrogen receptor 100% positive, progesterone receptor and HER-2 negative, with an MIB-1 of 26%. PET scan 01/13/2013 showed no other areas of disease and  (8) status post 35 Gy to the left proximal humerus completed 03/13/2013(  (9) fulvestrant, first injections on 01/23/2013  (10) zolendronic acid given monthly, first dose 01/23/2013; changed to denosumab as of August 2015 because of access problems  (11) multiple liver lesions noted on scans April 2015  (12) letrozole added May 2015, stopped 04/18/14 because of progression in her liver noted on a chest CT performed on 03/14/14  (13) everolimus and exemestane started 04/19/14   PLAN:  Christy Harrell is tolerating her  treatment well. The liver MRI is essentially stable: Generally we define progression as 20% increase in measurable disease.. The plan is going to be to continue her treatments as ordered. She will see [place] again in January and then again in March. Before the March visit we will repeat a liver MRI.  If we see 20% disease progression in the liver we would switch likely from  anti-estrogens to chemotherapy.  I am adding Compazine 5 mg for her to take before meals as needed to control the nausea, which is the chief side effects she is experiencing so far.  She has a good understanding of the overall plan. She agrees with it. She knows the goal of treatment in her case is control. She will call with any other problems that may develop before her next visit here. Chauncey Cruel, MD

## 2014-07-18 NOTE — Addendum Note (Signed)
Addended by: Laureen Abrahams on: 07/18/2014 05:56 PM   Modules accepted: Medications

## 2014-07-19 ENCOUNTER — Telehealth: Payer: Self-pay | Admitting: Internal Medicine

## 2014-07-19 DIAGNOSIS — E118 Type 2 diabetes mellitus with unspecified complications: Secondary | ICD-10-CM

## 2014-07-19 NOTE — Telephone Encounter (Signed)
Will allow Dr. Ronnald Ramp to decide once he returns.

## 2014-07-19 NOTE — Telephone Encounter (Signed)
Called pt gave md msg below will call back when Dr. Ronnald Ramp return with his recommendation...Christy Harrell

## 2014-07-19 NOTE — Telephone Encounter (Signed)
Pt called to say her Tanzeum would cost $1,500 per [facility], can this be changed to another medication or PA, pharmacy will fax over a request.

## 2014-07-19 NOTE — Telephone Encounter (Signed)
Error

## 2014-07-20 ENCOUNTER — Other Ambulatory Visit: Payer: Self-pay | Admitting: Oncology

## 2014-07-20 DIAGNOSIS — C50919 Malignant neoplasm of unspecified site of unspecified female breast: Secondary | ICD-10-CM

## 2014-07-25 ENCOUNTER — Encounter: Payer: Self-pay | Admitting: Podiatry

## 2014-07-25 ENCOUNTER — Ambulatory Visit (INDEPENDENT_AMBULATORY_CARE_PROVIDER_SITE_OTHER): Payer: Medicare Other | Admitting: Podiatry

## 2014-07-25 VITALS — BP 153/83 | HR 108 | Resp 18

## 2014-07-25 DIAGNOSIS — Z87768 Personal history of other specified (corrected) congenital malformations of integument, limbs and musculoskeletal system: Secondary | ICD-10-CM

## 2014-07-25 DIAGNOSIS — Z8776 Personal history of (corrected) congenital malformations of integument, limbs and musculoskeletal system: Secondary | ICD-10-CM

## 2014-07-25 DIAGNOSIS — M79676 Pain in unspecified toe(s): Secondary | ICD-10-CM

## 2014-07-25 DIAGNOSIS — B351 Tinea unguium: Secondary | ICD-10-CM

## 2014-07-25 NOTE — Patient Instructions (Signed)
Diabetes and Foot Care Diabetes may cause you to have problems because of poor blood supply (circulation) to your feet and legs. This may cause the skin on your feet to become thinner, break easier, and heal more slowly. Your skin may become dry, and the skin may peel and crack. You may also have nerve damage in your legs and feet causing decreased feeling in them. You may not notice minor injuries to your feet that could lead to infections or more serious problems. Taking care of your feet is one of the most important things you can do for yourself.  HOME CARE INSTRUCTIONS  Wear shoes at all times, even in the house. Do not go barefoot. Bare feet are easily injured.  Check your feet daily for blisters, cuts, and redness. If you cannot see the bottom of your feet, use a mirror or ask someone for help.  Wash your feet with warm water (do not use hot water) and mild soap. Then pat your feet and the areas between your toes until they are completely dry. Do not soak your feet as this can dry your skin.  Apply a moisturizing lotion or petroleum jelly (that does not contain alcohol and is unscented) to the skin on your feet and to dry, brittle toenails. Do not apply lotion between your toes.  Trim your toenails straight across. Do not dig under them or around the cuticle. File the edges of your nails with an emery board or nail file.  Do not cut corns or calluses or try to remove them with medicine.  Wear clean socks or stockings every day. Make sure they are not too tight. Do not wear knee-high stockings since they may decrease blood flow to your legs.  Wear shoes that fit properly and have enough cushioning. To break in new shoes, wear them for just a few hours a day. This prevents you from injuring your feet. Always look in your shoes before you put them on to be sure there are no objects inside.  Do not cross your legs. This may decrease the blood flow to your feet.  If you find a minor scrape,  cut, or break in the skin on your feet, keep it and the skin around it clean and dry. These areas may be cleansed with mild soap and water. Do not cleanse the area with peroxide, alcohol, or iodine.  When you remove an adhesive bandage, be sure not to damage the skin around it.  If you have a wound, look at it several times a day to make sure it is healing.  Do not use heating pads or hot water bottles. They may burn your skin. If you have lost feeling in your feet or legs, you may not know it is happening until it is too late.  Make sure your health care provider performs a complete foot exam at least annually or more often if you have foot problems. Report any cuts, sores, or bruises to your health care provider immediately. SEEK MEDICAL CARE IF:   You have an injury that is not healing.  You have cuts or breaks in the skin.  You have an ingrown nail.  You notice redness on your legs or feet.  You feel burning or tingling in your legs or feet.  You have pain or cramps in your legs and feet.  Your legs or feet are numb.  Your feet always feel cold. SEEK IMMEDIATE MEDICAL CARE IF:   There is increasing redness,   swelling, or pain in or around a wound.  There is a red line that goes up your leg.  Pus is coming from a wound.  You develop a fever or as directed by your health care provider.  You notice a bad smell coming from an ulcer or wound. Document Released: 07/24/2000 Document Revised: 03/29/2013 Document Reviewed: 01/03/2013 Exit[employer] Patient Information 2015 [employer]. This information is not intended to replace advice given to you by your health care provider. Make sure you discuss any questions you have with your health care provider.  

## 2014-07-25 NOTE — Progress Notes (Signed)
   Subjective:    Patient ID: Christy Harrell, female    DOB: Feb 27, 1941, 73 y.o.   MRN: [id number]  HPI  73 year old female presents the office today for painful elongated toenails. She states that her nails are painful as they are elongated particularly with shoe gear and pressure. She is diabetic and her last HbA1c was 7.7. 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 She denies any recent ulcerations. No tingling or numbness or any intermittent claudication symptoms. No other complaints at this time.  Review of Systems  Musculoskeletal: Positive for back pain.       JOINT PAIN DUE TO ARTHRITIS  All other systems reviewed and are negative.      Objective:   Physical Exam AAO 3, NAD DP/PT pulses palpable, CRT less than 3 seconds Protective sensation appears to be intact with Derrel Nip monofilament, vibratory sensation intact, Achilles tendon reflex intact. Nails hypertrophic, dystrophic, elongated, brittle, discolored 10. No surrounding erythema or drainage from around the nail sites. There is residual clubfoot deformity identified on the right foot > left foot. No areas of pinpoint bony tenderness or pain with vibratory sensation. There appears to be mild chronic bilateral lower extremity edema. There is no pain with calf compression, swelling, warmth, erythema. No open lesions or pre-ulcerative lesions. No interdigital maceration.       Assessment & Plan:  73 year old female with symptomatic onychomycosis. -Treatment options were discussed including alternatives, risks, complications. -Nail sharply debrided 10 without complications. -Discussed importance of daily foot inspection. -Follow-up in 3 months or sooner should any problems arise. In the meantime, call the office with any questions, concerns, change in symptoms.

## 2014-07-26 ENCOUNTER — Other Ambulatory Visit: Payer: Self-pay | Admitting: *Deleted

## 2014-07-26 DIAGNOSIS — E118 Type 2 diabetes mellitus with unspecified complications: Secondary | ICD-10-CM

## 2014-07-26 MED ORDER — ALBIGLUTIDE 30 MG ~~LOC~~ PEN: 1.0000 | PEN_INJECTOR | SUBCUTANEOUS | Status: DC

## 2014-07-28 ENCOUNTER — Telehealth: Payer: Self-pay

## 2014-07-28 DIAGNOSIS — Z0000 Encounter for general adult medical examination without abnormal findings: Secondary | ICD-10-CM

## 2014-07-28 NOTE — Telephone Encounter (Signed)
Spoke to pt and eye exam was done 2014 at [facility].   Will contact them to get a copy of OV notes from that visit.

## 2014-07-30 ENCOUNTER — Ambulatory Visit: Payer: Medicare Other

## 2014-07-30 ENCOUNTER — Other Ambulatory Visit: Payer: Medicare Other

## 2014-08-06 ENCOUNTER — Other Ambulatory Visit: Payer: Self-pay | Admitting: Internal Medicine

## 2014-08-06 DIAGNOSIS — E118 Type 2 diabetes mellitus with unspecified complications: Secondary | ICD-10-CM

## 2014-08-06 MED ORDER — ALBIGLUTIDE 30 MG ~~LOC~~ PEN: 1.0000 | PEN_INJECTOR | SUBCUTANEOUS | Status: DC

## 2014-08-08 ENCOUNTER — Ambulatory Visit: Payer: Medicare Other | Admitting: Internal Medicine

## 2014-08-08 DIAGNOSIS — R4689 Other symptoms and signs involving appearance and behavior: Secondary | ICD-10-CM | POA: Insufficient documentation

## 2014-08-24 ENCOUNTER — Ambulatory Visit (INDEPENDENT_AMBULATORY_CARE_PROVIDER_SITE_OTHER): Payer: Medicare Other | Admitting: Internal Medicine

## 2014-08-24 ENCOUNTER — Encounter: Payer: Self-pay | Admitting: Internal Medicine

## 2014-08-24 ENCOUNTER — Other Ambulatory Visit: Payer: Self-pay | Admitting: Oncology

## 2014-08-24 ENCOUNTER — Ambulatory Visit (INDEPENDENT_AMBULATORY_CARE_PROVIDER_SITE_OTHER)
Admission: RE | Admit: 2014-08-24 | Discharge: 2014-08-24 | Disposition: A | Discharge status: Home / Self Care | Payer: Medicare Other | Source: Ambulatory Visit | Attending: Internal Medicine | Admitting: Internal Medicine

## 2014-08-24 ENCOUNTER — Other Ambulatory Visit: Payer: Self-pay | Admitting: Internal Medicine

## 2014-08-24 VITALS — BP 110/70 | HR 68 | Temp 98.5°F | Resp 16 | Ht 60.0 in | Wt 187.0 lb

## 2014-08-24 DIAGNOSIS — R079 Chest pain, unspecified: Secondary | ICD-10-CM

## 2014-08-24 DIAGNOSIS — R05 Cough: Secondary | ICD-10-CM

## 2014-08-24 DIAGNOSIS — M159 Polyosteoarthritis, unspecified: Secondary | ICD-10-CM

## 2014-08-24 DIAGNOSIS — M25552 Pain in left hip: Secondary | ICD-10-CM

## 2014-08-24 DIAGNOSIS — E118 Type 2 diabetes mellitus with unspecified complications: Secondary | ICD-10-CM

## 2014-08-24 DIAGNOSIS — M4692 Unspecified inflammatory spondylopathy, cervical region: Secondary | ICD-10-CM

## 2014-08-24 DIAGNOSIS — M5030 Other cervical disc degeneration, unspecified cervical region: Secondary | ICD-10-CM

## 2014-08-24 DIAGNOSIS — M5412 Radiculopathy, cervical region: Secondary | ICD-10-CM

## 2014-08-24 DIAGNOSIS — M25559 Pain in unspecified hip: Secondary | ICD-10-CM | POA: Insufficient documentation

## 2014-08-24 DIAGNOSIS — R059 Cough, unspecified: Secondary | ICD-10-CM

## 2014-08-24 DIAGNOSIS — G893 Neoplasm related pain (acute) (chronic): Secondary | ICD-10-CM

## 2014-08-24 DIAGNOSIS — M4722 Other spondylosis with radiculopathy, cervical region: Secondary | ICD-10-CM

## 2014-08-24 DIAGNOSIS — Z853 Personal history of malignant neoplasm of breast: Secondary | ICD-10-CM

## 2014-08-24 DIAGNOSIS — J189 Pneumonia, unspecified organism: Secondary | ICD-10-CM | POA: Insufficient documentation

## 2014-08-24 DIAGNOSIS — M4682 Other specified inflammatory spondylopathies, cervical region: Secondary | ICD-10-CM

## 2014-08-24 MED ORDER — OXYCODONE HCL 5 MG PO TABS: 5.0000 mg | ORAL_TABLET | ORAL | Status: DC | PRN

## 2014-08-24 MED ORDER — EXENATIDE ER 2 MG ~~LOC~~ PEN: 1.0000 | PEN_INJECTOR | SUBCUTANEOUS | Status: DC

## 2014-08-24 NOTE — Telephone Encounter (Signed)
Received pharmacy rejection stating that insurance will not coverTanzeum without a prior authorization. Preferred alternatives are bydureon. PA started for Tanzeum, may have to change. Thanks

## 2014-08-24 NOTE — Progress Notes (Signed)
Pre visit review using our clinic review tool, if applicable. No additional management support is needed unless otherwise documented below in the visit note. 

## 2014-08-24 NOTE — Addendum Note (Signed)
Addended by: Janith Lima on: 08/24/2014 01:41 PM   Modules accepted: Medications

## 2014-08-24 NOTE — Telephone Encounter (Signed)
Changed to bydureon Sent to [facility]

## 2014-08-24 NOTE — Patient Instructions (Signed)
Cough, Adult  A cough is a reflex that helps clear your throat and airways. It can help heal the body or may be a reaction to an irritated airway. A cough may only last 2 or 3 weeks (acute) or may last more than 8 weeks (chronic).  CAUSES Acute cough:  Viral or bacterial infections. Chronic cough:  Infections.  Allergies.  Asthma.  Post-nasal drip.  Smoking.  Heartburn or acid reflux.  Some medicines.  Chronic lung problems (COPD).  Cancer. SYMPTOMS   Cough.  Fever.  Chest pain.  Increased breathing rate.  High-pitched whistling sound when breathing (wheezing).  Colored mucus that you cough up (sputum). TREATMENT   A bacterial cough may be treated with antibiotic medicine.  A viral cough must run its course and will not respond to antibiotics.  Your caregiver may recommend other treatments if you have a chronic cough. HOME CARE INSTRUCTIONS   Only take over-the-counter or prescription medicines for pain, discomfort, or fever as directed by your caregiver. Use cough suppressants only as directed by your caregiver.  Use a cold steam vaporizer or humidifier in your bedroom or home to help loosen secretions.  Sleep in a semi-upright position if your cough is worse at night.  Rest as needed.  Stop smoking if you smoke. SEEK IMMEDIATE MEDICAL CARE IF:   You have pus in your sputum.  Your cough starts to worsen.  You cannot control your cough with suppressants and are losing sleep.  You begin coughing up blood.  You have difficulty breathing.  You develop pain which is getting worse or is uncontrolled with medicine.  You have a fever. MAKE SURE YOU:   Understand these instructions.  Will watch your condition.  Will get help right away if you are not doing well or get worse. Document Released: 01/23/2011 Document Revised: 10/19/2011 Document Reviewed: 01/23/2011 Exit[employer] Patient Information 2015 [employer]. This information is not intended  to replace advice given to you by your health care provider. Make sure you discuss any questions you have with your health care provider.  

## 2014-08-24 NOTE — Progress Notes (Signed)
Subjective:    Patient ID: Christy Harrell, female    DOB: 05/20/1941, 74 y.o.   MRN: [id number]  Cough This is a new problem. The current episode started in the past 7 days. The problem has been unchanged. The problem occurs every few hours. The cough is non-productive. Associated symptoms include chest pain (sharp pain on the right lower side), a sore throat and shortness of breath. Pertinent negatives include no chills, ear congestion, ear pain, fever, headaches, heartburn, hemoptysis, myalgias, nasal congestion, postnasal drip, rash, rhinorrhea, sweats, weight loss or wheezing. She has tried nothing for the symptoms. The treatment provided no relief.      Review of Systems  Constitutional: Negative.  Negative for fever, chills, weight loss, diaphoresis, appetite change and fatigue.  HENT: Positive for sore throat. Negative for ear pain, postnasal drip, rhinorrhea, sinus pressure and trouble swallowing.   Eyes: Negative.   Respiratory: Positive for cough and shortness of breath. Negative for apnea, hemoptysis, choking, chest tightness, wheezing and stridor.   Cardiovascular: Positive for chest pain (sharp pain on the right lower side). Negative for palpitations and leg swelling.  Gastrointestinal: Negative.  Negative for heartburn, nausea, vomiting, abdominal pain, diarrhea, constipation and blood in stool.  Endocrine: Negative.   Genitourinary: Negative.   Musculoskeletal: Positive for arthralgias (both hips). Negative for myalgias, back pain and neck pain.  Skin: Negative.  Negative for rash.  Allergic/Immunologic: Negative.   Neurological: Negative.  Negative for headaches.  Hematological: Negative.  Negative for adenopathy. Does not bruise/bleed easily.  Psychiatric/Behavioral: Negative.        Objective:   Physical Exam  Constitutional: She is oriented to person, place, and time. She appears well-developed and well-nourished. No distress.  HENT:  Head: Normocephalic and  atraumatic.  Mouth/Throat: Oropharynx is clear and moist. No oropharyngeal exudate.  Eyes: Conjunctivae are normal. Right eye exhibits no discharge. Left eye exhibits no discharge. No scleral icterus.  Neck: Normal range of motion. Neck supple. No JVD present. No tracheal deviation present. No thyromegaly present.  Cardiovascular: Normal rate, regular rhythm, normal heart sounds and intact distal pulses.  Exam reveals no gallop and no friction rub.   No murmur heard. Pulmonary/Chest: Effort normal and breath sounds normal. No stridor. No respiratory distress. She has no wheezes. She has no rales. She exhibits no tenderness.  Abdominal: Soft. Bowel sounds are normal. She exhibits no distension and no mass. There is no tenderness. There is no rebound and no guarding.  Musculoskeletal: Normal range of motion. She exhibits no edema or tenderness.  Lymphadenopathy:    She has no cervical adenopathy.  Neurological: She is oriented to person, place, and time.  Skin: Skin is warm and dry. No rash noted. She is not diaphoretic. No erythema. No pallor.  Vitals reviewed.    Lab Results  Component Value Date   WBC 5.0 07/17/2014   HGB 10.8* 07/17/2014   HCT 34.3* 07/17/2014   PLT 277 07/17/2014   GLUCOSE 160* 07/17/2014   CHOL 217* 03/05/2014   TRIG 87.0 03/05/2014   HDL 66.80 03/05/2014   LDLDIRECT 160.0 03/16/2013   LDLCALC 133* 03/05/2014   ALT 15 07/17/2014   AST 25 07/17/2014   NA 143 07/17/2014   K 3.6 07/17/2014   CL 103 07/09/2014   CREATININE 0.8 07/17/2014   BUN 9.4 07/17/2014   CO2 26 07/17/2014   TSH 0.06* 07/09/2014   HGBA1C 7.7* 07/09/2014   MICROALBUR 0.4 03/16/2013       Assessment & Plan:

## 2014-08-27 ENCOUNTER — Other Ambulatory Visit (HOSPITAL_BASED_OUTPATIENT_CLINIC_OR_DEPARTMENT_OTHER): Payer: Medicare Other

## 2014-08-27 ENCOUNTER — Ambulatory Visit: Payer: Medicare Other

## 2014-08-27 ENCOUNTER — Encounter: Payer: Self-pay | Admitting: Nurse Practitioner

## 2014-08-27 ENCOUNTER — Encounter: Payer: Self-pay | Admitting: Internal Medicine

## 2014-08-27 ENCOUNTER — Ambulatory Visit (HOSPITAL_BASED_OUTPATIENT_CLINIC_OR_DEPARTMENT_OTHER): Payer: Medicare Other | Admitting: Nurse Practitioner

## 2014-08-27 ENCOUNTER — Ambulatory Visit (HOSPITAL_BASED_OUTPATIENT_CLINIC_OR_DEPARTMENT_OTHER): Payer: Medicare Other

## 2014-08-27 VITALS — BP 109/52 | HR 73 | Temp 98.5°F | Resp 18 | Ht 60.0 in | Wt 188.1 lb

## 2014-08-27 DIAGNOSIS — C773 Secondary and unspecified malignant neoplasm of axilla and upper limb lymph nodes: Secondary | ICD-10-CM

## 2014-08-27 DIAGNOSIS — C50511 Malignant neoplasm of lower-outer quadrant of right female breast: Secondary | ICD-10-CM

## 2014-08-27 DIAGNOSIS — C787 Secondary malignant neoplasm of liver and intrahepatic bile duct: Secondary | ICD-10-CM

## 2014-08-27 DIAGNOSIS — C50211 Malignant neoplasm of upper-inner quadrant of right female breast: Secondary | ICD-10-CM

## 2014-08-27 DIAGNOSIS — C50512 Malignant neoplasm of lower-outer quadrant of left female breast: Secondary | ICD-10-CM

## 2014-08-27 DIAGNOSIS — C7951 Secondary malignant neoplasm of bone: Secondary | ICD-10-CM

## 2014-08-27 DIAGNOSIS — C50919 Malignant neoplasm of unspecified site of unspecified female breast: Secondary | ICD-10-CM

## 2014-08-27 DIAGNOSIS — I890 Lymphedema, not elsewhere classified: Secondary | ICD-10-CM

## 2014-08-27 DIAGNOSIS — M899 Disorder of bone, unspecified: Secondary | ICD-10-CM

## 2014-08-27 DIAGNOSIS — Z5111 Encounter for antineoplastic chemotherapy: Secondary | ICD-10-CM

## 2014-08-27 LAB — CBC WITH DIFFERENTIAL/PLATELET
BASO%: 1.1 % (ref 0.0–2.0)
BASOS ABS: 0 10*3/uL (ref 0.0–0.1)
EOS ABS: 0.1 10*3/uL (ref 0.0–0.5)
EOS%: 3.2 % (ref 0.0–7.0)
HCT: 33.5 % — ABNORMAL LOW (ref 34.8–46.6)
HEMOGLOBIN: 10.7 g/dL — AB (ref 11.6–15.9)
LYMPH%: 25.6 % (ref 14.0–49.7)
MCH: 24.6 pg — ABNORMAL LOW (ref 25.1–34.0)
MCHC: 31.9 g/dL (ref 31.5–36.0)
MCV: 77.1 fL — ABNORMAL LOW (ref 79.5–101.0)
MONO#: 0.5 10*3/uL (ref 0.1–0.9)
MONO%: 11.1 % (ref 0.0–14.0)
NEUT#: 2.6 10*3/uL (ref 1.5–6.5)
NEUT%: 59 % (ref 38.4–76.8)
PLATELETS: 263 10*3/uL (ref 145–400)
RBC: 4.34 10*6/uL (ref 3.70–5.45)
RDW: 16 % — ABNORMAL HIGH (ref 11.2–14.5)
WBC: 4.5 10*3/uL (ref 3.9–10.3)
lymph#: 1.1 10*3/uL (ref 0.9–3.3)

## 2014-08-27 LAB — COMPREHENSIVE METABOLIC PANEL (CC13)
ALBUMIN: 2.9 g/dL — AB (ref 3.5–5.0)
ALK PHOS: 70 U/L (ref 40–150)
ALT: 18 U/L (ref 0–55)
AST: 26 U/L (ref 5–34)
Anion Gap: 10 mEq/L (ref 3–11)
BILIRUBIN TOTAL: 0.45 mg/dL (ref 0.20–1.20)
BUN: 11.8 mg/dL (ref 7.0–26.0)
CO2: 29 meq/L (ref 22–29)
Calcium: 8.4 mg/dL (ref 8.4–10.4)
Chloride: 106 mEq/L (ref 98–109)
Creatinine: 0.8 mg/dL (ref 0.6–1.1)
EGFR: 81 mL/min/{1.73_m2} — ABNORMAL LOW (ref >90)
GLUCOSE: 155 mg/dL — AB (ref 70–140)
Potassium: 3.4 mEq/L — ABNORMAL LOW (ref 3.5–5.1)
Sodium: 144 mEq/L (ref 136–145)
Total Protein: 6.8 g/dL (ref 6.4–8.3)

## 2014-08-27 MED ORDER — DENOSUMAB 120 MG/1.7ML ~~LOC~~ SOLN
120.0000 mg | Freq: Once | SUBCUTANEOUS | Status: AC
  Administered 2014-08-27: 120 mg via SUBCUTANEOUS
  Filled 2014-08-27: qty 1.7

## 2014-08-27 MED ORDER — FULVESTRANT 250 MG/5ML IM SOLN
500.0000 mg | Freq: Once | INTRAMUSCULAR | Status: AC
  Administered 2014-08-27: 500 mg via INTRAMUSCULAR
  Filled 2014-08-27: qty 10

## 2014-08-27 NOTE — Assessment & Plan Note (Signed)
Due to formulary restrictions will have to change tanzeum to bydureon for blood sugar control

## 2014-08-27 NOTE — Assessment & Plan Note (Signed)
Her exam and CXR are normal This appears to be a viral URI, will treat with sx relief

## 2014-08-27 NOTE — Progress Notes (Signed)
ID: [name]   DOB: [birth year]  MR#: [id number]  XVQ#:[id number]  PCP: [name], MD GYN:  SU:  OTHER MD:  Alysia Penna, Frederik Pear, Faustino Congress  CC: Breast cancer stage IV  TREATMENT: exemestane, everolimus, fulvestrant, and denosumab  BREAST CANCER HISTORY: From the original intake note:  The patient had routine screening mammography 10/16/2010 showing a lobulated mass in the right subareolar region measuring up to 5.5 cm. The Left breast showed some central microcalcifications. Biopsy of the Right breast mass on 10/28/2010 showed an invasive ductal carcinoma, grade 2, estrogen receptor positive (Allred score 8), progesterone receptor positive (Allred score 5), with an equivocal HER-2. The Left breast area of microcalcifications was biopsied at the same time, and was read as suspicious for DCIS.  On 11/17/2010 the patient underwent Right lumpectomy and axillary lymph node dissection for what proved to be a 3 cm invasive ductal carcinoma, grade 2, with some papillary and mucinous features. One of 16 lymph nodes was involved. FISH showed no HER-2 amplification. Left breast biopsy was benign.  The patient had an Oncotype sent, with a score of 27, predicting a risk of distant recurrence after 5 years of tamoxifen in the 18% range. With this intermediate result, the decision was made not to proceed with chemotherapy, since the patient is the sole caregiver to her very elderly mother. Instead the patient proceeded to radiation treatment which was completed 03/06/2011. She started anastrozole at that point. Her subsequent history is as detailed below   INTERVAL HISTORY: Chairty returns today for follow up of her metastatic breast cancer. She continues on exemestane and everolimus, with fulvestrant monthly. She has hot flashes, but denies vaginal changes, arthralgias/myalgials. She has no cough or shortness of breath. She has continued discomfort  to her lower row of teeth. Her mouth is dry and she is using biotene. The interval history is significant for the passing of her 29 year old mother on 08/13/14. Keshona is grieving appropriately at this time, and plans to be more active in the community now that she is not spending that time caring for her mother. However now that her mother is gone she has no one to help her put on her compression sleeve. The home health aid that helped Ruthie's mother every morning, also helped her put on the sleeve.   REVIEW OF SYSTEMS: Danijela denies, fevers, chills, or changes, in bowel or bladder habits. She uses colace and miralax for constipation PRN. She is nauseous at times, and uses compazine when she feels like it. She gets full quickly and states that nothing tastes the same. She uses oxycodone for her pain, but does not use it regularly. Her blood sugars have been up and down, some of this is related to stress. She is sleeping fine. A detailed review of systems is otherwise stable.  PAST MEDICAL HISTORY: Past Medical History  Diagnosis Date  . Cancer     right breast  . Club foot   . Arthritis   . Thyroid disease   . Hyperlipidemia   . Diabetes mellitus   . Osteoporosis   . Hypertension   . GERD (gastroesophageal reflux disease)     watches diet  . Neuromuscular disorder     lt arm numb sometimes  . Wears glasses   . 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 History of radiation therapy 02/22/13- 03/13/13    left proximal humerus 3500 cGy 14 sessions  .  Lymphedema     right arm  . Metastasis from malignant tumor of breast     liver    PAST SURGICAL HISTORY: Past Surgical History  Procedure Laterality Date  . Cesarean section    . Thyroidectomy  2002  . Foot surgery      as a child for club foot  . Breast surgery  2012    rt lump-16 nodes-in [place]  . Cesarean section    . Colonoscopy    . Dilation and curettage of uterus    . Humerus im nail Left  12/27/2012    Procedure: INTRAMEDULLARY (IM) NAIL HUMERAL LEFT PATHOLOGIC FRACTURE;  Surgeon: Nita Sells, MD;  Location: [facility];  Service: Orthopedics;  Laterality: Left;    FAMILY HISTORY Family History  Problem Relation Age of Onset  . Cancer Neg Hx   . Arthritis Neg Hx   . Heart disease Neg Hx   . Hyperlipidemia Neg Hx   . Hypertension Neg Hx   . Dementia Mother   . Hearing loss Mother    The patient's father died from unknown causes. The patient's mother is alive at age 10. The patient was a single child. There is no history of breast or ovarian cancer in the family to her knowledge.  GYNECOLOGIC HISTORY: Menarche age 5, menopause in her early 65s. The patient is GX P1, first pregnancy to term age 43. She did not take hormone replacement  SOCIAL HISTORY: The patient grew up in [place], attended [employer], then moved to [place] where she lived about 40 years. 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 Her mother, [name] away at 100, on 08-28-14. The patient's daughter [name], 61, is disabled secondary to pulmonary hypertension. She can be reached at [phone]. The patient has 2 grandchildren, [name] who works at a Scientist, physiological business and is 74 years old, in general, 71, completing high school.   ADVANCED DIRECTIVES:  HEALTH MAINTENANCE: History  Substance Use Topics  . Smoking status: Former Smoker    Quit date: 12/23/1974  . Smokeless tobacco: Never Used  . Alcohol Use: No     Colonoscopy:  PAP:    Bone density:  2012, on ibandronate chronically  Lipid panel: UTD  Allergies  Allergen Reactions  . Gadolinium Derivatives Other (See Comments)    Pt began having chest numbness/tingling , stated her heart felt funny, had to take her to ED for EKG per RN   CAP    Current Outpatient Prescriptions  Medication Sig Dispense Refill  . AFINITOR 10 MG tablet TAKE 1 TABLET BY MOUTH  ONCE DAILY 28 tablet 2  . aspirin EC 81 MG tablet Take 1 tablet (81 mg total) by mouth daily.    Marland Kitchen atorvastatin (LIPITOR) 40 MG tablet Take 1 tablet (40 mg total) by mouth daily. 30 tablet 11  . diclofenac sodium (VOLTAREN) 1 % GEL Apply 2 g topically 4 (four) times daily. 2 Tube 2  . Dietary Management Product (VASCULERA) TABS Take 1 capsule by mouth daily. 30 tablet 11  . docusate sodium (COLACE) 100 MG capsule Take 100 mg by mouth 2 (two) times daily as needed for constipation.    Marland Kitchen exemestane (AROMASIN) 25 MG tablet TAKE 1 TABLET BY MOUTH ONCE DAILY 30 tablet 2  . ezetimibe (ZETIA) 10 MG tablet Take 1 tablet (10 mg total) by mouth daily. 90 tablet 3  . fentaNYL (DURAGESIC) 50 MCG/HR Place 1 patch (  50 mcg total) onto the skin every 3 (three) days. 10 patch 0  . furosemide (LASIX) 40 MG tablet Take 1 tablet (40 mg total) by mouth daily. 30 tablet 6  . levothyroxine (SYNTHROID) 175 MCG tablet Take 1 tablet (175 mcg total) by mouth daily before breakfast. 90 tablet 1  . LORazepam (ATIVAN) 0.5 MG tablet Take 15 minutes before MRI test; may repeat x 1 10 tablet 0  . lubiprostone (AMITIZA) 24 MCG capsule Take 1 capsule (24 mcg total) by mouth 2 (two) times daily with a meal. 180 capsule 1  . meloxicam (MOBIC) 7.5 MG tablet Take 1 tablet (7.5 mg total) by mouth daily. 20 tablet 0  . metFORMIN (GLUCOPHAGE) 500 MG tablet Take 1 tablet (500 mg total) by mouth daily. 90 tablet 3  . Polyethyl Glycol-Propyl Glycol (SYSTANE ULTRA OP) Place 1 drop into both eyes daily.    . polyethylene glycol powder (GLYCOLAX/MIRALAX) powder Take 255 g (1 Container total) by mouth daily. 255 g 3  . potassium chloride SA (K-DUR,KLOR-CON) 20 MEQ tablet Take 1 tablet (20 mEq total) by mouth daily. 90 tablet 1  . pregabalin (LYRICA) 75 MG capsule Take 1 capsule (75 mg total) by mouth 2 (two) times daily. Not taking 60 capsule 5  . zolendronic acid (ZOMETA) 4 MG/5ML injection Inject 4 mg into the vein every 30 (thirty) days.     . Albiglutide 30 MG PEN Inject 30 mg into the skin. Inject 1 Act into the skin once a week.    Marland Kitchen oxyCODONE (OXY IR/ROXICODONE) 5 MG immediate release tablet Take 1 tablet (5 mg total) by mouth every 4 (four) hours as needed for severe pain. Take 1-2 tablets every 4-6 hours as needed for pain - fill on or after 05/09/14 (Patient not taking: Reported on 08/27/2014) 100 tablet 0  . prochlorperazine (COMPAZINE) 5 MG tablet Take 1 tablet (5 mg total) by mouth every 6 (six) hours as needed for nausea or vomiting. (Patient not taking: Reported on 08/27/2014) 90 tablet 6   No current facility-administered medications for this visit.   PHYSICAL EXAM: Elderly African American woman who appears stated age 73 Vitals:   08/27/14 1124  BP: 109/52  Pulse: 73  Temp: 98.5 F (36.9 C)  Resp: 18   Body mass index is 36.74 kg/(m^2).    ECOG FS: 1 Filed Weights   08/27/14 1124  Weight: 188 lb 1.6 oz (85.322 kg)    Skin: warm, dry  HEENT: sclerae anicteric, conjunctivae pink, oropharynx clear. No thrush or mucositis. Poor dentition Lymph Nodes: No cervical or supraclavicular lymphadenopathy  Lungs: clear to auscultation bilaterally, no rales, wheezes, or rhonci  Heart: regular rate and rhythm  Abdomen: round, soft, non tender, positive bowel sounds  Musculoskeletal: No focal spinal tenderness, chronic grade 2 right upper extremity lymphedema, not in sleeve.  Neuro: non focal, well oriented, positive affect  Breasts: right breast status post lumpectomy and radiation. No evidence of local recurrence. Right axilla benign. Left breast unremarkable     LAB RESULTS: Lab Results  Component Value Date   WBC 4.5 08/27/2014   NEUTROABS 2.6 08/27/2014   HGB 10.7* 08/27/2014   HCT 33.5* 08/27/2014   MCV 77.1* 08/27/2014   PLT 263 08/27/2014      Chemistry      Component Value Date/Time   NA 144 08/27/2014 1104   NA 137 07/09/2014 1404   K 3.4* 08/27/2014 1104   K 3.3* 07/09/2014 1404   CL 103  07/09/2014  1404   CL 105 01/23/2013 1337   CO2 29 08/27/2014 1104   CO2 26 07/09/2014 1404   BUN 11.8 08/27/2014 1104   BUN 12 07/09/2014 1404   CREATININE 0.8 08/27/2014 1104   CREATININE 0.7 07/09/2014 1404      Component Value Date/Time   CALCIUM 8.4 08/27/2014 1104   CALCIUM 8.4 07/09/2014 1404   ALKPHOS 70 08/27/2014 1104   ALKPHOS 58 11/27/2013 1404   AST 26 08/27/2014 1104   AST 17 11/27/2013 1404   ALT 18 08/27/2014 1104   ALT 11 03/05/2014 1108   BILITOT 0.45 08/27/2014 1104   BILITOT 0.6 11/27/2013 1404       Lab Results  Component Value Date   LABCA2 24 11/19/2011    STUDIES: Dg Chest 2 View  08/24/2014   CLINICAL DATA:  Intra scapular pain.  EXAM: CHEST  2 VIEW  COMPARISON:  04/12/2014  FINDINGS: Heart is upper limits normal in size. Mediastinal contours are within normal limits. No confluent airspace opacities or effusions. Degenerative changes in the thoracic spine.  IMPRESSION: No active cardiopulmonary disease.   Electronically Signed   By: Rolm Baptise M.D.   On: 08/24/2014 17:02   Dg Hips Bilat With Pelvis Min 5 Views  08/24/2014   CLINICAL DATA:  Bilateral hip pain.  EXAM: DG HIP W/ PELVIS 5+V BILAT  COMPARISON:  None.  FINDINGS: Both hips are normally located. Moderate degenerative changes bilaterally with joint space narrowing and osteophytic spurring. No fracture or plain film evidence of avascular necrosis. The pubic symphysis and SI joints are intact. No pelvic fractures.  IMPRESSION: Bilateral hip joint degenerative changes but no acute bony findings.   Electronically Signed   By: Kalman Jewels M.D.   On: 08/24/2014 17:03    ASSESSMENT: 74 y.o.  [place] woman with stage IV [bone only] breast cancer in a  (1)  status post right lumpectomy and axillary lymph node dissection 11/17/2010 for a pT2 pN1, stage IIB invasive ductal carcinoma, grade 2, estrogen and progesterone receptor positive, HER-2 negative,   (2) Oncotype DX recurrence score of 27  predicting a risk of distant recurrence of 18% with 5 years of tamoxifen (intermediate score);   (3)  status post radiation completed July of 2012,   (4) started anastrozole July 2012, discontinued June 2014 with evidence of metastatic spread to bone  (5) Chronic lymphedema in the right upper extremity.  (6) pathologic fracture of the left humerus along apparent lytic lesion, with no other bone lesions per bone scan 12/12/2012  (7) on 12/27/2012 underwent #1 intramedullary nail left pathologic proximal humerus fracture  #2 left shoulder rotator cuff repair  #3 left shoulder open bone biopsy with pathology showing metastatic breast adenocarcinoma, estrogen receptor 100% positive, progesterone receptor and HER-2 negative, with an MIB-1 of 26%. PET scan 01/13/2013 showed no other areas of disease and  (8) status post 35 Gy to the left proximal humerus completed 03/13/2013(  (9) fulvestrant, first injections on 01/23/2013  (10) zolendronic acid given monthly, first dose 01/23/2013; changed to denosumab as of August 2015 because of access problems  (11) multiple liver lesions noted on scans April 2015  (12) letrozole added May 2015, stopped 04/18/14 because of progression in her liver noted on a chest CT performed on 03/14/14  (13) everolimus and exemestane started 04/19/14   PLAN:  Kilyn continues to tolerate the everolimus, exemestane, and fulvestrant well. She will continue these drugs. She is due for denosumab today as well and will  receive all injections after this appointment.   I have urged Thesesalonia to wear her night sleeve during the day now, as this is the only one she can apply herself. She states she's going to call a friend who lives nearby to see if they can help.   Kyani will have a repeat liver MRI in March, then follow up with labs and a visit with Dr. [name]. Per his last note, "If we see 20% disease progression in the liver we would switch likely from  anti-estrogens to chemotherapy." Ori understands and agrees with this plan. She knows the goal of treatment in her case is control. She has been encouraged to call with any issues that might arise before her next visit here.   Marcelino Duster, NP  08/27/2014

## 2014-08-27 NOTE — Assessment & Plan Note (Signed)
+   DJD on plain films Will con the current pain meds

## 2014-08-27 NOTE — Assessment & Plan Note (Signed)
Plain films today show DJD in both hips Will cont oxycodone, fentanyl, mobic

## 2014-08-27 NOTE — Patient Instructions (Signed)
Denosumab injection What is this medicine? DENOSUMAB (den oh sue mab) slows bone breakdown. Prolia is used to treat osteoporosis in women after menopause and in men. Xgeva is used to prevent bone fractures and other bone problems caused by cancer bone metastases. Xgeva is also used to treat giant cell tumor of the bone. This medicine may be used for other purposes; ask your health care provider or pharmacist if you have questions. COMMON BRAND NAME(S): Prolia, XGEVA What should I tell my health care provider before I take this medicine? They need to know if you have any of these conditions: -dental disease -eczema -infection or history of infections -kidney disease or on dialysis -low blood calcium or vitamin D -malabsorption syndrome -scheduled to have surgery or tooth extraction -taking medicine that contains denosumab -thyroid or parathyroid disease -an unusual reaction to denosumab, other medicines, foods, dyes, or preservatives -pregnant or trying to get pregnant -breast-feeding How should I use this medicine? This medicine is for injection under the skin. It is given by a health care professional in a hospital or clinic setting. If you are getting Prolia, a special MedGuide will be given to you by the pharmacist with each prescription and refill. Be sure to read this information carefully each time. For Prolia, talk to your pediatrician regarding the use of this medicine in children. Special care may be needed. For Xgeva, talk to your pediatrician regarding the use of this medicine in children. While this drug may be prescribed for children as young as 13 years for selected conditions, precautions do apply. Overdosage: If you think you've taken too much of this medicine contact a poison control center or emergency room at once. Overdosage: If you think you have taken too much of this medicine contact a poison control center or emergency room at once. NOTE: This medicine is only for  you. Do not share this medicine with others. What if I miss a dose? It is important not to miss your dose. Call your doctor or health care professional if you are unable to keep an appointment. What may interact with this medicine? Do not take this medicine with any of the following medications: -other medicines containing denosumab This medicine may also interact with the following medications: -medicines that suppress the immune system -medicines that treat cancer -steroid medicines like prednisone or cortisone This list may not describe all possible interactions. Give your health care provider a list of all the medicines, herbs, non-prescription drugs, or dietary supplements you use. Also tell them if you smoke, drink alcohol, or use illegal drugs. Some items may interact with your medicine. What should I watch for while using this medicine? Visit your doctor or health care professional for regular checks on your progress. Your doctor or health care professional may order blood tests and other tests to see how you are doing. Call your doctor or health care professional if you get a cold or other infection while receiving this medicine. Do not treat yourself. This medicine may decrease your body's ability to fight infection. You should make sure you get enough calcium and vitamin D while you are taking this medicine, unless your doctor tells you not to. Discuss the foods you eat and the vitamins you take with your health care professional. See your dentist regularly. Brush and floss your teeth as directed. Before you have any dental work done, tell your dentist you are receiving this medicine. Do not become pregnant while taking this medicine or for 5 months after stopping   it. Women should inform their doctor if they wish to become pregnant or think they might be pregnant. There is a potential for serious side effects to an unborn child. Talk to your health care professional or pharmacist for more  information. What side effects may I notice from receiving this medicine? Side effects that you should report to your doctor or health care professional as soon as possible: -allergic reactions like skin rash, itching or hives, swelling of the face, lips, or tongue -breathing problems -chest pain -fast, irregular heartbeat -feeling faint or lightheaded, falls -fever, chills, or any other sign of infection -muscle spasms, tightening, or twitches -numbness or tingling -skin blisters or bumps, or is dry, peels, or red -slow healing or unexplained pain in the mouth or jaw -unusual bleeding or bruising Side effects that usually do not require medical attention (Report these to your doctor or health care professional if they continue or are bothersome.): -muscle pain -stomach upset, gas This list may not describe all possible side effects. Call your doctor for medical advice about side effects. You may report side effects to FDA at 1-800-FDA-1088. Where should I keep my medicine? This medicine is only given in a clinic, doctor's office, or other health care setting and will not be stored at home. NOTE: This sheet is a summary. It may not cover all possible information. If you have questions about this medicine, talk to your doctor, pharmacist, or health care provider.  2015, Elsevier/Gold Standard. (2012-01-25 12:37:47)  Fulvestrant injection What is this medicine? FULVESTRANT (ful VES trant) blocks the effects of estrogen. It is used to treat breast cancer in women past the age of menopause. This medicine may be used for other purposes; ask your health care provider or pharmacist if you have questions. COMMON BRAND NAME(S): FASLODEX What should I tell my health care provider before I take this medicine? They need to know if you have any of these conditions: -bleeding problems -liver disease -low levels of platelets in the blood -an unusual or allergic reaction to fulvestrant, other  medicines, foods, dyes, or preservatives -pregnant or trying to get pregnant -breast-feeding How should I use this medicine? This medicine is for injection into a muscle. It is usually given by a health care professional in a hospital or clinic setting. Talk to your pediatrician regarding the use of this medicine in children. Special care may be needed. Overdosage: If you think you have taken too much of this medicine contact a poison control center or emergency room at once. NOTE: This medicine is only for you. Do not share this medicine with others. What if I miss a dose? It is important not to miss your dose. Call your doctor or health care professional if you are unable to keep an appointment. What may interact with this medicine? -medicines that treat or prevent blood clots like warfarin, enoxaparin, and dalteparin This list may not describe all possible interactions. Give your health care provider a list of all the medicines, herbs, non-prescription drugs, or dietary supplements you use. Also tell them if you smoke, drink alcohol, or use illegal drugs. Some items may interact with your medicine. What should I watch for while using this medicine? Your condition will be monitored carefully while you are receiving this medicine. You will need important blood work done while you are taking this medicine. Do not become pregnant while taking this medicine. Women should inform their doctor if they wish to become pregnant or think they might be pregnant. There is   a potential for serious side effects to an unborn child. Talk to your health care professional or pharmacist for more information. What side effects may I notice from receiving this medicine? Side effects that you should report to your doctor or health care professional as soon as possible: -allergic reactions like skin rash, itching or hives, swelling of the face, lips, or tongue -feeling faint or lightheaded, falls -fever or flu-like  symptoms -sore throat -vaginal bleeding Side effects that usually do not require medical attention (report to your doctor or health care professional if they continue or are bothersome): -aches, pains -constipation or diarrhea -headache -hot flashes -nausea, vomiting -pain at site where injected -stomach pain This list may not describe all possible side effects. Call your doctor for medical advice about side effects. You may report side effects to FDA at 1-800-FDA-1088. Where should I keep my medicine? This drug is given in a hospital or clinic and will not be stored at home. NOTE: This sheet is a summary. It may not cover all possible information. If you have questions about this medicine, talk to your doctor, pharmacist, or health care provider.  2015, Elsevier/Gold Standard. (2007-12-05 15:39:24)  

## 2014-09-04 ENCOUNTER — Emergency Department (HOSPITAL_COMMUNITY): Payer: Medicare Other

## 2014-09-04 ENCOUNTER — Encounter (HOSPITAL_COMMUNITY): Payer: Self-pay

## 2014-09-04 ENCOUNTER — Emergency Department (HOSPITAL_COMMUNITY)
Admission: EM | Admit: 2014-09-04 | Discharge: 2014-09-04 | Disposition: A | Discharge status: Home / Self Care | Payer: Medicare Other | Attending: Emergency Medicine | Admitting: Emergency Medicine

## 2014-09-04 DIAGNOSIS — M810 Age-related osteoporosis without current pathological fracture: Secondary | ICD-10-CM | POA: Diagnosis not present

## 2014-09-04 DIAGNOSIS — Z7982 Long term (current) use of aspirin: Secondary | ICD-10-CM | POA: Diagnosis not present

## 2014-09-04 DIAGNOSIS — E079 Disorder of thyroid, unspecified: Secondary | ICD-10-CM | POA: Diagnosis not present

## 2014-09-04 DIAGNOSIS — J159 Unspecified bacterial pneumonia: Secondary | ICD-10-CM | POA: Insufficient documentation

## 2014-09-04 DIAGNOSIS — E785 Hyperlipidemia, unspecified: Secondary | ICD-10-CM | POA: Insufficient documentation

## 2014-09-04 DIAGNOSIS — Z87891 Personal history of nicotine dependence: Secondary | ICD-10-CM | POA: Insufficient documentation

## 2014-09-04 DIAGNOSIS — Z8669 Personal history of other diseases of the nervous system and sense organs: Secondary | ICD-10-CM | POA: Diagnosis not present

## 2014-09-04 DIAGNOSIS — E119 Type 2 diabetes mellitus without complications: Secondary | ICD-10-CM | POA: Diagnosis not present

## 2014-09-04 DIAGNOSIS — Z79899 Other long term (current) drug therapy: Secondary | ICD-10-CM | POA: Insufficient documentation

## 2014-09-04 DIAGNOSIS — R4182 Altered mental status, unspecified: Secondary | ICD-10-CM | POA: Diagnosis not present

## 2014-09-04 DIAGNOSIS — Z791 Long term (current) use of non-steroidal anti-inflammatories (NSAID): Secondary | ICD-10-CM | POA: Insufficient documentation

## 2014-09-04 DIAGNOSIS — Z853 Personal history of malignant neoplasm of breast: Secondary | ICD-10-CM | POA: Insufficient documentation

## 2014-09-04 DIAGNOSIS — Z8719 Personal history of other diseases of the digestive system: Secondary | ICD-10-CM | POA: Insufficient documentation

## 2014-09-04 DIAGNOSIS — Z973 Presence of spectacles and contact lenses: Secondary | ICD-10-CM | POA: Diagnosis not present

## 2014-09-04 DIAGNOSIS — M1990 Unspecified osteoarthritis, unspecified site: Secondary | ICD-10-CM | POA: Diagnosis not present

## 2014-09-04 DIAGNOSIS — J189 Pneumonia, unspecified organism: Secondary | ICD-10-CM

## 2014-09-04 DIAGNOSIS — I10 Essential (primary) hypertension: Secondary | ICD-10-CM | POA: Diagnosis not present

## 2014-09-04 LAB — COMPREHENSIVE METABOLIC PANEL
ALBUMIN: 3.3 g/dL — AB (ref 3.5–5.2)
ALT: 20 U/L (ref 0–35)
AST: 37 U/L (ref 0–37)
Alkaline Phosphatase: 74 U/L (ref 39–117)
Anion gap: 11 (ref 5–15)
BUN: 15 mg/dL (ref 6–23)
CALCIUM: 8.8 mg/dL (ref 8.4–10.5)
CO2: 24 mmol/L (ref 19–32)
CREATININE: 1.15 mg/dL — AB (ref 0.50–1.10)
Chloride: 108 mmol/L (ref 96–112)
GFR calc Af Amer: 53 mL/min — ABNORMAL LOW (ref >90)
GFR, EST NON AFRICAN AMERICAN: 46 mL/min — AB (ref >90)
GLUCOSE: 168 mg/dL — AB (ref 70–99)
POTASSIUM: 3.6 mmol/L (ref 3.5–5.1)
Sodium: 143 mmol/L (ref 135–145)
Total Bilirubin: 0.7 mg/dL (ref 0.3–1.2)
Total Protein: 7.5 g/dL (ref 6.0–8.3)

## 2014-09-04 LAB — CBC WITH DIFFERENTIAL/PLATELET
Basophils Absolute: 0 10*3/uL (ref 0.0–0.1)
Basophils Relative: 0 % (ref 0–1)
EOS ABS: 0 10*3/uL (ref 0.0–0.7)
Eosinophils Relative: 1 % (ref 0–5)
HEMATOCRIT: 35.3 % — AB (ref 36.0–46.0)
Hemoglobin: 10.9 g/dL — ABNORMAL LOW (ref 12.0–15.0)
Lymphocytes Relative: 8 % — ABNORMAL LOW (ref 12–46)
Lymphs Abs: 0.7 10*3/uL (ref 0.7–4.0)
MCH: 24.6 pg — AB (ref 26.0–34.0)
MCHC: 30.9 g/dL (ref 30.0–36.0)
MCV: 79.7 fL (ref 78.0–100.0)
MONO ABS: 0.5 10*3/uL (ref 0.1–1.0)
Monocytes Relative: 6 % (ref 3–12)
NEUTROS ABS: 7 10*3/uL (ref 1.7–7.7)
NEUTROS PCT: 85 % — AB (ref 43–77)
PLATELETS: 195 10*3/uL (ref 150–400)
RBC: 4.43 MIL/uL (ref 3.87–5.11)
RDW: 15.9 % — ABNORMAL HIGH (ref 11.5–15.5)
WBC: 8.2 10*3/uL (ref 4.0–10.5)

## 2014-09-04 LAB — URINALYSIS, ROUTINE W REFLEX MICROSCOPIC
Glucose, UA: NEGATIVE mg/dL
Hgb urine dipstick: NEGATIVE
Ketones, ur: 15 mg/dL — AB
LEUKOCYTES UA: NEGATIVE
NITRITE: NEGATIVE
PH: 5 (ref 5.0–8.0)
PROTEIN: 30 mg/dL — AB
SPECIFIC GRAVITY, URINE: 1.026 (ref 1.005–1.030)
UROBILINOGEN UA: 0.2 mg/dL (ref 0.0–1.0)

## 2014-09-04 LAB — URINE MICROSCOPIC-ADD ON

## 2014-09-04 LAB — I-STAT TROPONIN, ED: Troponin i, poc: 0.07 ng/mL (ref 0.00–0.08)

## 2014-09-04 MED ORDER — SODIUM CHLORIDE 0.9 % IV BOLUS (SEPSIS)
500.0000 mL | Freq: Once | INTRAVENOUS | Status: AC
  Administered 2014-09-04: 500 mL via INTRAVENOUS

## 2014-09-04 MED ORDER — AZITHROMYCIN 250 MG PO TABS: ORAL_TABLET | ORAL | Status: DC

## 2014-09-04 NOTE — ED Notes (Signed)
Pt states she just lost mother in earlier January; pt is very talkative; pt keeps stating she thinks she must have been sleep in house since past Sunday; Pt remembers Sunday;

## 2014-09-04 NOTE — ED Notes (Signed)
Per EMS pt set off home alarm; pt told police she believe she was having a stroke; EMS states stroke screen for them was negative; Pt is confused at times; does not remember setting off home alarm; No slurred speech; denies chest pain; pt had 2 x n/v....pt has left hip pain which has been seen by PCP for on past Friday with imaging done.

## 2014-09-04 NOTE — ED Notes (Signed)
MD at bedside. 

## 2014-09-04 NOTE — ED Provider Notes (Signed)
CSN: [id number]     Arrival date & time 09/04/14  1913 History   First MD Initiated Contact with Patient 09/04/14 1916     Chief Complaint  Patient presents with  . Altered Mental Status     (Consider location/radiation/quality/duration/timing/severity/associated sxs/prior Treatment) HPI Comments: Patient is a 74 year old female with past medical history of type 2 diabetes, hypothyroidism, chronic hip pain. She presents today for evaluation of decreased responsiveness. Her family apparently tried to reach her today, however she would not answer the phone. Eventually, Event organiser and EMS were called to investigate. They found her asleep in bed and was very difficult to wake up. She is brought today for evaluation of this.  She denies to me she is experiencing any symptoms. She denies any fevers or chills. She denies any chest pain, shortness of breath, headache, or other symptoms.  Patient currently lives by herself. Her elderly mother was living with her until last month when she passed away.  Patient is a 74 y.o. female presenting with altered mental status. The history is provided by the patient.  Altered Mental Status Presenting symptoms: partial responsiveness   Severity:  Moderate Most recent episode:  Today Episode history:  Single Timing:  Constant Progression:  Resolved Chronicity:  New   Past Medical History  Diagnosis Date  . Cancer     right breast  . Club foot   . Arthritis   . Thyroid disease   . Hyperlipidemia   . Diabetes mellitus   . Osteoporosis   . Hypertension   . GERD (gastroesophageal reflux disease)     watches diet  . Neuromuscular disorder     lt arm numb sometimes  . Wears glasses   . 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 History of radiation therapy 02/22/13- 03/13/13    left proximal humerus 3500 cGy 14 sessions  . Lymphedema     right arm  . Metastasis from malignant tumor of breast     liver   Past Surgical  History  Procedure Laterality Date  . Cesarean section    . Thyroidectomy  2002  . Foot surgery      as a child for club foot  . Breast surgery  2012    rt lump-16 nodes-in [place]  . Cesarean section    . Colonoscopy    . Dilation and curettage of uterus    . Humerus im nail Left 12/27/2012    Procedure: INTRAMEDULLARY (IM) NAIL HUMERAL LEFT PATHOLOGIC FRACTURE;  Surgeon: Nita Sells, MD;  Location: [facility];  Service: Orthopedics;  Laterality: Left;   Family History  Problem Relation Age of Onset  . Cancer Neg Hx   . Arthritis Neg Hx   . Heart disease Neg Hx   . Hyperlipidemia Neg Hx   . Hypertension Neg Hx   . Dementia Mother   . Hearing loss Mother    History  Substance Use Topics  . Smoking status: Former Smoker    Quit date: 12/23/1974  . Smokeless tobacco: Never Used  . Alcohol Use: No   OB History    No data available     Review of Systems  All other systems reviewed and are negative.     Allergies  Gadolinium derivatives  Home Medications   Prior to Admission medications   Medication Sig Start Date End Date Taking? Authorizing Provider  AFINITOR 10 MG tablet TAKE 1 TABLET BY MOUTH ONCE DAILY  07/20/14   Chauncey Cruel, MD  Albiglutide 30 MG PEN Inject 30 mg into the skin. Inject 1 Act into the skin once a week.    Historical Provider, MD  aspirin EC 81 MG tablet Take 1 tablet (81 mg total) by mouth daily. 01/22/14   Sueanne Margarita, MD  atorvastatin (LIPITOR) 40 MG tablet Take 1 tablet (40 mg total) by mouth daily. 01/22/14   Sueanne Margarita, MD  diclofenac sodium (VOLTAREN) 1 % GEL Apply 2 g topically 4 (four) times daily. 05/31/12   Aundria Mems, PA-C  Dietary Management Product (VASCULERA) TABS Take 1 capsule by mouth daily. 03/22/13   Janith Lima, MD  docusate sodium (COLACE) 100 MG capsule Take 100 mg by mouth 2 (two) times daily as needed for constipation.    Historical Provider, MD  exemestane (AROMASIN) 25 MG tablet TAKE  1 TABLET BY MOUTH ONCE DAILY 07/20/14   Chauncey Cruel, MD  ezetimibe (ZETIA) 10 MG tablet Take 1 tablet (10 mg total) by mouth daily. 11/28/13   Janith Lima, MD  fentaNYL (DURAGESIC) 50 MCG/HR Place 1 patch (50 mcg total) onto the skin every 3 (three) days. 07/09/14   Janith Lima, MD  furosemide (LASIX) 40 MG tablet Take 1 tablet (40 mg total) by mouth daily. 03/08/14   Janith Lima, MD  levothyroxine (SYNTHROID) 175 MCG tablet Take 1 tablet (175 mcg total) by mouth daily before breakfast. 07/09/14   Janith Lima, MD  LORazepam (ATIVAN) 0.5 MG tablet Take 15 minutes before MRI test; may repeat x 1 03/12/14   Chauncey Cruel, MD  lubiprostone (AMITIZA) 24 MCG capsule Take 1 capsule (24 mcg total) by mouth 2 (two) times daily with a meal. 08/31/13   Janith Lima, MD  meloxicam (MOBIC) 7.5 MG tablet Take 1 tablet (7.5 mg total) by mouth daily. 03/08/14   Janith Lima, MD  metFORMIN (GLUCOPHAGE) 500 MG tablet Take 1 tablet (500 mg total) by mouth daily. 03/08/14   Janith Lima, MD  oxyCODONE (OXY IR/ROXICODONE) 5 MG immediate release tablet Take 1 tablet (5 mg total) by mouth every 4 (four) hours as needed for severe pain. Take 1-2 tablets every 4-6 hours as needed for pain - fill on or after 05/09/14 Patient not taking: Reported on 08/27/2014 08/24/14   Janith Lima, MD  Polyethyl Glycol-Propyl Glycol (SYSTANE ULTRA OP) Place 1 drop into both eyes daily.    Historical Provider, MD  polyethylene glycol powder (GLYCOLAX/MIRALAX) powder Take 255 g (1 Container total) by mouth daily. 03/27/14   Irene Shipper, MD  potassium chloride SA (K-DUR,KLOR-CON) 20 MEQ tablet Take 1 tablet (20 mEq total) by mouth daily. 11/28/13   Janith Lima, MD  pregabalin (LYRICA) 75 MG capsule Take 1 capsule (75 mg total) by mouth 2 (two) times daily. Not taking 11/27/13   Janith Lima, MD  prochlorperazine (COMPAZINE) 5 MG tablet Take 1 tablet (5 mg total) by mouth every 6 (six) hours as needed for nausea or  vomiting. Patient not taking: Reported on 08/27/2014 07/17/14   Chauncey Cruel, MD  zolendronic acid (ZOMETA) 4 MG/5ML injection Inject 4 mg into the vein every 30 (thirty) days.    Historical Provider, MD   BP 120/60 mmHg  Pulse 115  Temp(Src) 99.8 F (37.7 C) (Oral)  Resp 23  SpO2 100% Physical Exam  Constitutional: She is oriented to person, place, and time. She appears well-developed and well-nourished. No  distress.  HENT:  Head: Normocephalic and atraumatic.  Neck: Normal range of motion. Neck supple.  Cardiovascular: Normal rate and regular rhythm.  Exam reveals no gallop and no friction rub.   No murmur heard. Pulmonary/Chest: Effort normal and breath sounds normal. No respiratory distress. She has no wheezes.  Abdominal: Soft. Bowel sounds are normal. She exhibits no distension. There is no tenderness.  Musculoskeletal: Normal range of motion.  The right upper extremity is edematous. This is her baseline due to complications from breast cancer surgery many years ago.  Neurological: She is alert and oriented to person, place, and time.  Skin: Skin is warm and dry. She is not diaphoretic.  Nursing note and vitals reviewed.   ED Course  Procedures (including critical care time) Labs Review Labs Reviewed - No data to display  Imaging Review No results found.   Date: 09/04/2014  Rate: 120  Rhythm: sinus tachycardia  QRS Axis: normal  Intervals: normal  ST/T Wave abnormalities: nonspecific T wave changes  Conduction Disutrbances:right bundle branch block  Narrative Interpretation:   Old EKG Reviewed: none available    MDM   Final diagnoses:  None    Patient was brought here for evaluation by EMS after family had a difficult time getting her to respond to their phone calls. She denies to me she is experiencing any significant symptoms. She appears in no acute distress, has no complaints, and her physical examination is essentially unremarkable. Workup reveals no  elevation of white count, no evidence for UTI, EKG and troponin which do not reflect a cardiac event, and metabolic panel which is unremarkable as well. Her chest x-ray reveals a minimal retrocardiac opacity which may reflect atelectasis or possibly mild pneumonia. She reports to me she did have a productive cough several days ago. She will be treated with an antibiotic but otherwise appears appropriate for discharge.  While I was speaking with her, she speaks in full sentences and her oxygen saturations have maintained in the upper 90s while on room air. She is not tachycardic while speaking with me.    Veryl Speak, MD 09/04/14 2142

## 2014-09-04 NOTE — Discharge Instructions (Signed)
Zithromax as prescribed.  Return to the emergency department if you develop any new and concerning symptoms.   Pneumonia Pneumonia is an infection of the lungs.  CAUSES Pneumonia may be caused by bacteria or a virus. Usually, these infections are caused by breathing infectious particles into the lungs (respiratory tract). SIGNS AND SYMPTOMS   Cough.  Fever.  Chest pain.  Increased rate of breathing.  Wheezing.  Mucus production. DIAGNOSIS  If you have the common symptoms of pneumonia, your health care provider will typically confirm the diagnosis with a chest X-ray. The X-ray will show an abnormality in the lung (pulmonary infiltrate) if you have pneumonia. Other tests of your blood, urine, or sputum may be done to find the specific cause of your pneumonia. Your health care provider may also do tests (blood gases or pulse oximetry) to see how well your lungs are working. TREATMENT  Some forms of pneumonia may be spread to other people when you cough or sneeze. You may be asked to wear a mask before and during your exam. Pneumonia that is caused by bacteria is treated with antibiotic medicine. Pneumonia that is caused by the influenza virus may be treated with an antiviral medicine. Most other viral infections must run their course. These infections will not respond to antibiotics.  HOME CARE INSTRUCTIONS   Cough suppressants may be used if you are losing too much rest. However, coughing protects you by clearing your lungs. You should avoid using cough suppressants if you can.  Your health care provider may have prescribed medicine if he or she thinks your pneumonia is caused by bacteria or influenza. Finish your medicine even if you start to feel better.  Your health care provider may also prescribe an expectorant. This loosens the mucus to be coughed up.  Take medicines only as directed by your health care provider.  Do not smoke. Smoking is a common cause of bronchitis and can  contribute to pneumonia. If you are a smoker and continue to smoke, your cough may last several weeks after your pneumonia has cleared.  A cold steam vaporizer or humidifier in your room or home may help loosen mucus.  Coughing is often worse at night. Sleeping in a semi-upright position in a recliner or using a couple pillows under your head will help with this.  Get rest as you feel it is needed. Your body will usually let you know when you need to rest. PREVENTION A pneumococcal shot (vaccine) is available to prevent a common bacterial cause of pneumonia. This is usually suggested for:  People over 66 years old.  Patients on chemotherapy.  People with chronic lung problems, such as bronchitis or emphysema.  People with immune system problems. If you are over 65 or have a high risk condition, you may receive the pneumococcal vaccine if you have not received it before. In some countries, a routine influenza vaccine is also recommended. This vaccine can help prevent some cases of pneumonia.You may be offered the influenza vaccine as part of your care. If you smoke, it is time to quit. You may receive instructions on how to stop smoking. Your health care provider can provide medicines and counseling to help you quit. SEEK MEDICAL CARE IF: You have a fever. SEEK IMMEDIATE MEDICAL CARE IF:   Your illness becomes worse. This is especially true if you are elderly or weakened from any other disease.  You cannot control your cough with suppressants and are losing sleep.  You begin coughing up  blood.  You develop pain which is getting worse or is uncontrolled with medicines.  Any of the symptoms which initially brought you in for treatment are getting worse rather than better.  You develop shortness of breath or chest pain. MAKE SURE YOU:   Understand these instructions.  Will watch your condition.  Will get help right away if you are not doing well or get worse. Document Released:  07/27/2005 Document Revised: 12/11/2013 Document Reviewed: 10/16/2010 [facility] Patient Information 2015 [place], [place]. This information is not intended to replace advice given to you by your health care provider. Make sure you discuss any questions you have with your health care provider.

## 2014-09-11 ENCOUNTER — Ambulatory Visit
Admission: RE | Admit: 2014-09-11 | Discharge: 2014-09-11 | Disposition: A | Discharge status: Home / Self Care | Payer: Medicare Other | Source: Ambulatory Visit | Attending: Oncology | Admitting: Oncology

## 2014-09-11 DIAGNOSIS — Z853 Personal history of malignant neoplasm of breast: Secondary | ICD-10-CM

## 2014-09-14 ENCOUNTER — Encounter: Payer: Self-pay | Admitting: Internal Medicine

## 2014-09-14 ENCOUNTER — Ambulatory Visit (INDEPENDENT_AMBULATORY_CARE_PROVIDER_SITE_OTHER): Payer: Medicare Other | Admitting: Internal Medicine

## 2014-09-14 VITALS — BP 110/68 | HR 88 | Temp 98.2°F | Resp 16 | Ht 60.0 in | Wt 184.5 lb

## 2014-09-14 DIAGNOSIS — E118 Type 2 diabetes mellitus with unspecified complications: Secondary | ICD-10-CM

## 2014-09-14 DIAGNOSIS — J189 Pneumonia, unspecified organism: Secondary | ICD-10-CM

## 2014-09-14 DIAGNOSIS — T300 Burn of unspecified body region, unspecified degree: Secondary | ICD-10-CM

## 2014-09-14 MED ORDER — EXENATIDE ER 2 MG ~~LOC~~ PEN: 1.0000 | PEN_INJECTOR | SUBCUTANEOUS | Status: DC

## 2014-09-14 MED ORDER — MUPIROCIN 2 % EX OINT: 1.0000 "application " | TOPICAL_OINTMENT | Freq: Two times a day (BID) | CUTANEOUS | Status: DC

## 2014-09-14 NOTE — Assessment & Plan Note (Signed)
She appears to be improving after a course of zithromax Will recheck her CXR today

## 2014-09-14 NOTE — Progress Notes (Signed)
Pre visit review using our clinic review tool, if applicable. No additional management support is needed unless otherwise documented below in the visit note. 

## 2014-09-14 NOTE — Assessment & Plan Note (Signed)
This appears to be healing without complication I have asked her to apply bactroban to prevent wound infection

## 2014-09-14 NOTE — Assessment & Plan Note (Signed)
She has been taught how to use the bydureon pen Will recheck her A1C in 3-4 months

## 2014-09-14 NOTE — Patient Instructions (Signed)
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
## 2014-09-21 ENCOUNTER — Other Ambulatory Visit: Payer: Self-pay | Admitting: *Deleted

## 2014-09-21 DIAGNOSIS — C50512 Malignant neoplasm of lower-outer quadrant of left female breast: Secondary | ICD-10-CM

## 2014-09-21 DIAGNOSIS — C787 Secondary malignant neoplasm of liver and intrahepatic bile duct: Secondary | ICD-10-CM

## 2014-09-24 ENCOUNTER — Other Ambulatory Visit: Payer: Medicare Other

## 2014-09-24 ENCOUNTER — Ambulatory Visit: Payer: Medicare Other

## 2014-09-24 ENCOUNTER — Telehealth: Payer: Self-pay | Admitting: *Deleted

## 2014-09-24 NOTE — Telephone Encounter (Signed)
Called [facility] about missed appointments   Stated that she tried to call about cancelling appointments, but couldn't get thru.  Cancelled appointment due to weather, and rescheduled to next week.  Patient has new date and time.

## 2014-09-26 LAB — HM DIABETES EYE EXAM

## 2014-10-01 ENCOUNTER — Ambulatory Visit: Payer: Medicare Other

## 2014-10-01 ENCOUNTER — Ambulatory Visit
Admission: RE | Admit: 2014-10-01 | Discharge: 2014-10-01 | Disposition: A | Discharge status: Home / Self Care | Payer: Medicare Other | Source: Ambulatory Visit | Attending: Internal Medicine | Admitting: Internal Medicine

## 2014-10-01 ENCOUNTER — Other Ambulatory Visit (HOSPITAL_BASED_OUTPATIENT_CLINIC_OR_DEPARTMENT_OTHER): Payer: Medicare Other

## 2014-10-01 DIAGNOSIS — C50512 Malignant neoplasm of lower-outer quadrant of left female breast: Secondary | ICD-10-CM

## 2014-10-01 DIAGNOSIS — C50511 Malignant neoplasm of lower-outer quadrant of right female breast: Secondary | ICD-10-CM

## 2014-10-01 DIAGNOSIS — M899 Disorder of bone, unspecified: Secondary | ICD-10-CM

## 2014-10-01 DIAGNOSIS — J189 Pneumonia, unspecified organism: Secondary | ICD-10-CM

## 2014-10-01 DIAGNOSIS — C787 Secondary malignant neoplasm of liver and intrahepatic bile duct: Secondary | ICD-10-CM

## 2014-10-01 LAB — COMPREHENSIVE METABOLIC PANEL (CC13)
ALT: 11 U/L (ref 0–55)
AST: 21 U/L (ref 5–34)
Albumin: 3 g/dL — ABNORMAL LOW (ref 3.5–5.0)
Alkaline Phosphatase: 73 U/L (ref 40–150)
Anion Gap: 9 mEq/L (ref 3–11)
BILIRUBIN TOTAL: 0.38 mg/dL (ref 0.20–1.20)
BUN: 8.2 mg/dL (ref 7.0–26.0)
CHLORIDE: 109 meq/L (ref 98–109)
CO2: 27 mEq/L (ref 22–29)
CREATININE: 0.8 mg/dL (ref 0.6–1.1)
Calcium: 9 mg/dL (ref 8.4–10.4)
EGFR: 82 mL/min/{1.73_m2} — AB (ref >90)
Glucose: 202 mg/dl — ABNORMAL HIGH (ref 70–140)
Potassium: 3.3 mEq/L — ABNORMAL LOW (ref 3.5–5.1)
SODIUM: 145 meq/L (ref 136–145)
TOTAL PROTEIN: 6.7 g/dL (ref 6.4–8.3)

## 2014-10-01 LAB — CBC WITH DIFFERENTIAL/PLATELET
BASO%: 1.3 % (ref 0.0–2.0)
Basophils Absolute: 0.1 10*3/uL (ref 0.0–0.1)
EOS%: 4 % (ref 0.0–7.0)
Eosinophils Absolute: 0.2 10*3/uL (ref 0.0–0.5)
HCT: 32.4 % — ABNORMAL LOW (ref 34.8–46.6)
HGB: 10.3 g/dL — ABNORMAL LOW (ref 11.6–15.9)
LYMPH#: 1.2 10*3/uL (ref 0.9–3.3)
LYMPH%: 27.5 % (ref 14.0–49.7)
MCH: 24 pg — ABNORMAL LOW (ref 25.1–34.0)
MCHC: 31.8 g/dL (ref 31.5–36.0)
MCV: 75.5 fL — AB (ref 79.5–101.0)
MONO#: 0.4 10*3/uL (ref 0.1–0.9)
MONO%: 9.9 % (ref 0.0–14.0)
NEUT%: 57.3 % (ref 38.4–76.8)
NEUTROS ABS: 2.6 10*3/uL (ref 1.5–6.5)
PLATELETS: 282 10*3/uL (ref 145–400)
RBC: 4.29 10*6/uL (ref 3.70–5.45)
RDW: 17.4 % — AB (ref 11.2–14.5)
WBC: 4.5 10*3/uL (ref 3.9–10.3)

## 2014-10-01 MED ORDER — FULVESTRANT 250 MG/5ML IM SOLN
500.0000 mg | Freq: Once | INTRAMUSCULAR | Status: DC
  Filled 2014-10-01: qty 10
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
## 2014-10-05 ENCOUNTER — Telehealth: Payer: Self-pay | Admitting: Internal Medicine

## 2014-10-05 DIAGNOSIS — T300 Burn of unspecified body region, unspecified degree: Secondary | ICD-10-CM

## 2014-10-05 MED ORDER — MUPIROCIN 2 % EX OINT: 1.0000 "application " | TOPICAL_OINTMENT | Freq: Two times a day (BID) | CUTANEOUS | Status: DC

## 2014-10-05 NOTE — Telephone Encounter (Signed)
Notified pt rx sent to [facility]...Christy Harrell
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
## 2014-10-18 ENCOUNTER — Ambulatory Visit (HOSPITAL_COMMUNITY)
Admission: RE | Admit: 2014-10-18 | Discharge: 2014-10-18 | Disposition: A | Discharge status: Home / Self Care | Payer: Medicare Other | Source: Ambulatory Visit | Attending: Nurse Practitioner | Admitting: Nurse Practitioner

## 2014-10-18 DIAGNOSIS — C50919 Malignant neoplasm of unspecified site of unspecified female breast: Secondary | ICD-10-CM | POA: Diagnosis not present

## 2014-10-18 DIAGNOSIS — C787 Secondary malignant neoplasm of liver and intrahepatic bile duct: Secondary | ICD-10-CM | POA: Diagnosis present

## 2014-10-18 DIAGNOSIS — C50512 Malignant neoplasm of lower-outer quadrant of left female breast: Secondary | ICD-10-CM

## 2014-10-18 DIAGNOSIS — C7951 Secondary malignant neoplasm of bone: Secondary | ICD-10-CM

## 2014-10-19 ENCOUNTER — Encounter: Payer: Self-pay | Admitting: Internal Medicine

## 2014-10-19 ENCOUNTER — Other Ambulatory Visit: Payer: Self-pay | Admitting: *Deleted

## 2014-10-19 ENCOUNTER — Encounter: Payer: Self-pay | Admitting: Cardiology

## 2014-10-19 ENCOUNTER — Other Ambulatory Visit (INDEPENDENT_AMBULATORY_CARE_PROVIDER_SITE_OTHER): Payer: Medicare Other

## 2014-10-19 ENCOUNTER — Ambulatory Visit (INDEPENDENT_AMBULATORY_CARE_PROVIDER_SITE_OTHER): Payer: Medicare Other | Admitting: Internal Medicine

## 2014-10-19 VITALS — BP 120/64 | HR 83 | Temp 98.3°F | Resp 16 | Ht 60.0 in | Wt 182.0 lb

## 2014-10-19 DIAGNOSIS — E038 Other specified hypothyroidism: Secondary | ICD-10-CM

## 2014-10-19 DIAGNOSIS — E1040 Type 1 diabetes mellitus with diabetic neuropathy, unspecified: Secondary | ICD-10-CM

## 2014-10-19 DIAGNOSIS — K5901 Slow transit constipation: Secondary | ICD-10-CM | POA: Diagnosis not present

## 2014-10-19 DIAGNOSIS — E118 Type 2 diabetes mellitus with unspecified complications: Secondary | ICD-10-CM

## 2014-10-19 DIAGNOSIS — E1065 Type 1 diabetes mellitus with hyperglycemia: Secondary | ICD-10-CM

## 2014-10-19 DIAGNOSIS — E1140 Type 2 diabetes mellitus with diabetic neuropathy, unspecified: Secondary | ICD-10-CM

## 2014-10-19 DIAGNOSIS — C50512 Malignant neoplasm of lower-outer quadrant of left female breast: Secondary | ICD-10-CM

## 2014-10-19 DIAGNOSIS — E1041 Type 1 diabetes mellitus with diabetic mononeuropathy: Secondary | ICD-10-CM | POA: Diagnosis not present

## 2014-10-19 DIAGNOSIS — E1165 Type 2 diabetes mellitus with hyperglycemia: Secondary | ICD-10-CM

## 2014-10-19 DIAGNOSIS — E876 Hypokalemia: Secondary | ICD-10-CM

## 2014-10-19 DIAGNOSIS — IMO0002 Reserved for concepts with insufficient information to code with codable children: Secondary | ICD-10-CM

## 2014-10-19 LAB — BASIC METABOLIC PANEL
BUN: 11 mg/dL (ref 6–23)
CHLORIDE: 107 meq/L (ref 96–112)
CO2: 32 mEq/L (ref 19–32)
Calcium: 9.2 mg/dL (ref 8.4–10.5)
Creatinine, Ser: 0.77 mg/dL (ref 0.40–1.20)
GFR: 94.45 mL/min (ref >60.00)
GLUCOSE: 132 mg/dL — AB (ref 70–99)
Potassium: 3.1 mEq/L — ABNORMAL LOW (ref 3.5–5.1)
Sodium: 142 mEq/L (ref 135–145)

## 2014-10-19 LAB — URINALYSIS, ROUTINE W REFLEX MICROSCOPIC
BILIRUBIN URINE: NEGATIVE
Leukocytes, UA: NEGATIVE
NITRITE: NEGATIVE
Specific Gravity, Urine: >=1.03 — AB (ref 1.000–1.030)
Total Protein, Urine: 30 — AB
UROBILINOGEN UA: 1 (ref 0.0–1.0)
Urine Glucose: NEGATIVE
pH: 6 (ref 5.0–8.0)

## 2014-10-19 LAB — TSH: TSH: 6.45 u[IU]/mL — AB (ref 0.35–4.50)

## 2014-10-19 LAB — MICROALBUMIN / CREATININE URINE RATIO
Creatinine,U: 499.2 mg/dL
MICROALB UR: 7.3 mg/dL — AB (ref 0.0–1.9)
MICROALB/CREAT RATIO: 1.5 mg/g (ref 0.0–30.0)

## 2014-10-19 LAB — HEMOGLOBIN A1C: Hgb A1c MFr Bld: 7.1 % — ABNORMAL HIGH (ref 4.6–6.5)

## 2014-10-19 MED ORDER — LUBIPROSTONE 24 MCG PO CAPS: 24.0000 ug | ORAL_CAPSULE | Freq: Two times a day (BID) | ORAL | Status: DC

## 2014-10-19 MED ORDER — LEVOTHYROXINE SODIUM 200 MCG PO TABS: 200.0000 ug | ORAL_TABLET | Freq: Every day | ORAL | Status: DC

## 2014-10-19 MED ORDER — PREGABALIN 75 MG PO CAPS: 75.0000 mg | ORAL_CAPSULE | Freq: Two times a day (BID) | ORAL | Status: DC

## 2014-10-19 MED ORDER — POTASSIUM CHLORIDE CRYS ER 20 MEQ PO TBCR: 20.0000 meq | EXTENDED_RELEASE_TABLET | Freq: Two times a day (BID) | ORAL | Status: DC

## 2014-10-19 NOTE — Patient Instructions (Signed)
Hypothyroidism The thyroid is a large gland located in the lower front of your neck. The thyroid gland helps control metabolism. Metabolism is how your body handles food. It controls metabolism with the hormone thyroxine. When this gland is underactive (hypothyroid), it produces too little hormone.  CAUSES These include:   Absence or destruction of thyroid tissue.  Goiter due to iodine deficiency.  Goiter due to medications.  Congenital defects (since birth).  Problems with the pituitary. This causes a lack of TSH (thyroid stimulating hormone). This hormone tells the thyroid to turn out more hormone. SYMPTOMS  Lethargy (feeling as though you have no energy)  Cold intolerance  Weight gain (in spite of normal food intake)  Dry skin  Coarse hair  Menstrual irregularity (if severe, may lead to infertility)  Slowing of thought processes Cardiac problems are also caused by insufficient amounts of thyroid hormone. Hypothyroidism in the newborn is cretinism, and is an extreme form. It is important that this form be treated adequately and immediately or it will lead rapidly to retarded physical and mental development. DIAGNOSIS  To prove hypothyroidism, your caregiver may do blood tests and ultrasound tests. Sometimes the signs are hidden. It may be necessary for your caregiver to watch this illness with blood tests either before or after diagnosis and treatment. TREATMENT  Low levels of thyroid hormone are increased by using synthetic thyroid hormone. This is a safe, effective treatment. It usually takes about four weeks to gain the full effects of the medication. After you have the full effect of the medication, it will generally take another four weeks for problems to leave. Your caregiver may start you on low doses. If you have had heart problems the dose may be gradually increased. It is generally not an emergency to get rapidly to normal. HOME CARE INSTRUCTIONS   Take your  medications as your caregiver suggests. Let your caregiver know of any medications you are taking or start taking. Your caregiver will help you with dosage schedules.  As your condition improves, your dosage needs may increase. It will be necessary to have continuing blood tests as suggested by your caregiver.  Report all suspected medication side effects to your caregiver. SEEK MEDICAL CARE IF: Seek medical care if you develop:  Sweating.  Tremulousness (tremors).  Anxiety.  Rapid weight loss.  Heat intolerance.  Emotional swings.  Diarrhea.  Weakness. SEEK IMMEDIATE MEDICAL CARE IF:  You develop chest pain, an irregular heart beat (palpitations), or a rapid heart beat. MAKE SURE YOU:   Understand these instructions.  Will watch your condition.  Will get help right away if you are not doing well or get worse. Document Released: 07/27/2005 Document Revised: 10/19/2011 Document Reviewed: 03/16/2008 [employer] Patient Information 2015 [employer]. This information is not intended to replace advice given to you by your health care provider. Make sure you discuss any questions you have with your health care provider.  
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
## 2014-10-19 NOTE — Progress Notes (Signed)
Pre visit review using our clinic review tool, if applicable. No additional management support is needed unless otherwise documented below in the visit note. 

## 2014-10-21 NOTE — Assessment & Plan Note (Signed)
She is doing well on her current regimen Will recheck her A1C today

## 2014-10-21 NOTE — Assessment & Plan Note (Signed)
I will check her TSh and will adjust her dose if needed 

## 2014-10-22 ENCOUNTER — Other Ambulatory Visit (HOSPITAL_BASED_OUTPATIENT_CLINIC_OR_DEPARTMENT_OTHER): Payer: Medicare Other

## 2014-10-22 ENCOUNTER — Ambulatory Visit (HOSPITAL_BASED_OUTPATIENT_CLINIC_OR_DEPARTMENT_OTHER): Payer: Medicare Other

## 2014-10-22 ENCOUNTER — Telehealth: Payer: Self-pay | Admitting: Oncology

## 2014-10-22 ENCOUNTER — Ambulatory Visit (HOSPITAL_BASED_OUTPATIENT_CLINIC_OR_DEPARTMENT_OTHER): Payer: Medicare Other | Admitting: Oncology

## 2014-10-22 VITALS — BP 113/60 | HR 79 | Temp 98.1°F | Resp 19 | Ht 60.0 in | Wt 182.9 lb

## 2014-10-22 DIAGNOSIS — E8989 Other postprocedural endocrine and metabolic complications and disorders: Secondary | ICD-10-CM

## 2014-10-22 DIAGNOSIS — C50512 Malignant neoplasm of lower-outer quadrant of left female breast: Secondary | ICD-10-CM

## 2014-10-22 DIAGNOSIS — C50919 Malignant neoplasm of unspecified site of unspecified female breast: Secondary | ICD-10-CM

## 2014-10-22 DIAGNOSIS — Z170 Estrogen receptor positive status [ER+]: Secondary | ICD-10-CM

## 2014-10-22 DIAGNOSIS — C773 Secondary and unspecified malignant neoplasm of axilla and upper limb lymph nodes: Secondary | ICD-10-CM | POA: Diagnosis not present

## 2014-10-22 DIAGNOSIS — C50111 Malignant neoplasm of central portion of right female breast: Secondary | ICD-10-CM

## 2014-10-22 DIAGNOSIS — M810 Age-related osteoporosis without current pathological fracture: Secondary | ICD-10-CM

## 2014-10-22 DIAGNOSIS — I700 Atherosclerosis of aorta: Secondary | ICD-10-CM

## 2014-10-22 DIAGNOSIS — C7951 Secondary malignant neoplasm of bone: Secondary | ICD-10-CM | POA: Diagnosis not present

## 2014-10-22 DIAGNOSIS — C787 Secondary malignant neoplasm of liver and intrahepatic bile duct: Secondary | ICD-10-CM

## 2014-10-22 DIAGNOSIS — Z5111 Encounter for antineoplastic chemotherapy: Secondary | ICD-10-CM | POA: Diagnosis not present

## 2014-10-22 DIAGNOSIS — M899 Disorder of bone, unspecified: Secondary | ICD-10-CM

## 2014-10-22 DIAGNOSIS — I890 Lymphedema, not elsewhere classified: Secondary | ICD-10-CM

## 2014-10-22 LAB — CBC WITH DIFFERENTIAL/PLATELET
BASO%: 0.8 % (ref 0.0–2.0)
Basophils Absolute: 0 10*3/uL (ref 0.0–0.1)
EOS%: 4.9 % (ref 0.0–7.0)
Eosinophils Absolute: 0.2 10*3/uL (ref 0.0–0.5)
HCT: 33.8 % — ABNORMAL LOW (ref 34.8–46.6)
HGB: 10.5 g/dL — ABNORMAL LOW (ref 11.6–15.9)
LYMPH%: 36.4 % (ref 14.0–49.7)
MCH: 24.7 pg — ABNORMAL LOW (ref 25.1–34.0)
MCHC: 31.1 g/dL — ABNORMAL LOW (ref 31.5–36.0)
MCV: 79.5 fL (ref 79.5–101.0)
MONO#: 0.4 10*3/uL (ref 0.1–0.9)
MONO%: 11 % (ref 0.0–14.0)
NEUT#: 1.8 10*3/uL (ref 1.5–6.5)
NEUT%: 46.9 % (ref 38.4–76.8)
Platelets: 200 10*3/uL (ref 145–400)
RBC: 4.25 10*6/uL (ref 3.70–5.45)
RDW: 18.1 % — ABNORMAL HIGH (ref 11.2–14.5)
WBC: 3.9 10*3/uL (ref 3.9–10.3)
lymph#: 1.4 10*3/uL (ref 0.9–3.3)

## 2014-10-22 MED ORDER — EVEROLIMUS 10 MG PO TABS: 10.0000 mg | ORAL_TABLET | Freq: Every day | ORAL | Status: DC

## 2014-10-22 MED ORDER — DENOSUMAB 120 MG/1.7ML ~~LOC~~ SOLN
120.0000 mg | Freq: Once | SUBCUTANEOUS | Status: AC
  Administered 2014-10-22: 120 mg via SUBCUTANEOUS
  Filled 2014-10-22: qty 1.7

## 2014-10-22 MED ORDER — EXEMESTANE 25 MG PO TABS: 25.0000 mg | ORAL_TABLET | Freq: Every day | ORAL | Status: DC

## 2014-10-22 MED ORDER — FULVESTRANT 250 MG/5ML IM SOLN
500.0000 mg | Freq: Once | INTRAMUSCULAR | Status: AC
  Administered 2014-10-22: 500 mg via INTRAMUSCULAR
  Filled 2014-10-22: qty 10

## 2014-10-22 NOTE — Telephone Encounter (Signed)
appts made and avs printed for pt  Christy Harrell °
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
## 2014-10-23 ENCOUNTER — Encounter: Payer: Self-pay | Admitting: Internal Medicine

## 2014-10-24 ENCOUNTER — Encounter: Payer: Self-pay | Admitting: Podiatry

## 2014-10-24 ENCOUNTER — Ambulatory Visit (INDEPENDENT_AMBULATORY_CARE_PROVIDER_SITE_OTHER): Payer: Medicare Other | Admitting: Podiatry

## 2014-10-24 ENCOUNTER — Ambulatory Visit (INDEPENDENT_AMBULATORY_CARE_PROVIDER_SITE_OTHER): Payer: Medicare Other

## 2014-10-24 VITALS — BP 127/77 | HR 82 | Resp 18

## 2014-10-24 DIAGNOSIS — L84 Corns and callosities: Secondary | ICD-10-CM

## 2014-10-24 DIAGNOSIS — Z8776 Personal history of (corrected) congenital malformations of integument, limbs and musculoskeletal system: Secondary | ICD-10-CM | POA: Diagnosis not present

## 2014-10-24 DIAGNOSIS — M79676 Pain in unspecified toe(s): Secondary | ICD-10-CM

## 2014-10-24 DIAGNOSIS — B351 Tinea unguium: Secondary | ICD-10-CM | POA: Diagnosis not present

## 2014-10-24 DIAGNOSIS — M79671 Pain in right foot: Secondary | ICD-10-CM | POA: Diagnosis not present

## 2014-10-24 DIAGNOSIS — Z87768 Personal history of other specified (corrected) congenital malformations of integument, limbs and musculoskeletal system: Secondary | ICD-10-CM

## 2014-10-24 DIAGNOSIS — R52 Pain, unspecified: Secondary | ICD-10-CM

## 2014-10-24 NOTE — Progress Notes (Signed)
Patient ID: Christy Harrell, female   DOB: [birth year], 74 y.o.   MRN: [id number]  Subjective: 74 y.o.-year-old female returns the office today for painful, elongated, thickened toenails which she is unable to trim herself. Denies any redness or drainage around the nails. She also states that she has calluses on bottoms feet that are painful as well which causes pain protective pressure. Denies any redness or drainage on sites. She also states that she has some discomfort overlying the right foot particularly over the big toe. She denies any history of injury or trauma to the area. She states that she has swelling to both of her legs which is chronic. She denies any overlying redness or any increase in warmth. She has a no prior treatment. Denies any acute changes since last appointment and no new complaints today. Denies any systemic complaints such as fevers, chills, nausea, vomiting.   Objective: AAO 3, NAD DP/PT pulses palpable, CRT less than 3 seconds Protective sensation intact with Simms Weinstein monofilament, Achilles tendon reflex intact.  Nails hypertrophic, dystrophic, elongated, brittle, discolored 10. There is tenderness overlying the nails 1-5 bilaterally. There is no surrounding erythema or drainage along the nail sites. Diffuse hyperkeratotic lesions bilateral submetatarsal heads as well as plantar heels bilaterally. Upon debridement lesion there is no underlying ulceration or clinical signs of infection. There is prominent metatarsal heads plantarly with atrophy of the fat pad bilaterally. There is no evidence of pinpoint bony tenderness or pain with vibratory sensation to bilateral lower extremities. There is chronic lower extremity edema present. There is no pain with range of motion. Cavus foot type on the right from prior clubfoot.  No other areas of tenderness bilateral lower extremities. No overlying edema, erythema, increased warmth. No pain with calf compression, swelling,  warmth, erythema.  Assessment: Patient presents with symptomatic onychomycosis; hyperkerotic lesions, right foot pain   Plan: -X-rays of the right foot were obtained and reviewed with the patient. -Treatment options including alternatives, risks, complications were discussed -Nails sharply debrided 10 without complication/bleeding. -Hyperkeratotic lesions sharply debrided 4 without complication/bleeding. -Discussed the patient she may likely benefit from diabetic shoes and custom inserts. Discussion for diabetic shoes given to the patient for biotech. -Discussed daily foot inspection. If there are any changes, to call the office immediately.  -Follow-up in 3 months or sooner if any problems are to arise. In the meantime, encouraged to call the office with any questions, concerns, changes symptoms.

## 2014-11-21 ENCOUNTER — Ambulatory Visit (HOSPITAL_BASED_OUTPATIENT_CLINIC_OR_DEPARTMENT_OTHER): Payer: Medicare Other

## 2014-11-21 ENCOUNTER — Telehealth: Payer: Self-pay | Admitting: Nurse Practitioner

## 2014-11-21 ENCOUNTER — Other Ambulatory Visit (HOSPITAL_BASED_OUTPATIENT_CLINIC_OR_DEPARTMENT_OTHER): Payer: Medicare Other

## 2014-11-21 ENCOUNTER — Encounter: Payer: Self-pay | Admitting: Nurse Practitioner

## 2014-11-21 ENCOUNTER — Other Ambulatory Visit: Payer: Self-pay | Admitting: *Deleted

## 2014-11-21 ENCOUNTER — Ambulatory Visit (HOSPITAL_BASED_OUTPATIENT_CLINIC_OR_DEPARTMENT_OTHER): Payer: Medicare Other | Admitting: Nurse Practitioner

## 2014-11-21 VITALS — BP 142/62 | HR 86 | Temp 98.8°F | Resp 18 | Ht 60.0 in | Wt 177.7 lb

## 2014-11-21 DIAGNOSIS — C50512 Malignant neoplasm of lower-outer quadrant of left female breast: Secondary | ICD-10-CM

## 2014-11-21 DIAGNOSIS — C50111 Malignant neoplasm of central portion of right female breast: Secondary | ICD-10-CM

## 2014-11-21 DIAGNOSIS — C7951 Secondary malignant neoplasm of bone: Secondary | ICD-10-CM | POA: Diagnosis not present

## 2014-11-21 DIAGNOSIS — Z5111 Encounter for antineoplastic chemotherapy: Secondary | ICD-10-CM | POA: Diagnosis not present

## 2014-11-21 DIAGNOSIS — C773 Secondary and unspecified malignant neoplasm of axilla and upper limb lymph nodes: Secondary | ICD-10-CM

## 2014-11-21 DIAGNOSIS — C787 Secondary malignant neoplasm of liver and intrahepatic bile duct: Secondary | ICD-10-CM | POA: Diagnosis not present

## 2014-11-21 DIAGNOSIS — F419 Anxiety disorder, unspecified: Secondary | ICD-10-CM

## 2014-11-21 DIAGNOSIS — I890 Lymphedema, not elsewhere classified: Secondary | ICD-10-CM

## 2014-11-21 DIAGNOSIS — M899 Disorder of bone, unspecified: Secondary | ICD-10-CM

## 2014-11-21 DIAGNOSIS — Z91041 Radiographic dye allergy status: Secondary | ICD-10-CM

## 2014-11-21 DIAGNOSIS — F418 Other specified anxiety disorders: Secondary | ICD-10-CM

## 2014-11-21 LAB — COMPREHENSIVE METABOLIC PANEL (CC13)
ALK PHOS: 79 U/L (ref 40–150)
ALT: 11 U/L (ref 0–55)
ANION GAP: 9 meq/L (ref 3–11)
AST: 20 U/L (ref 5–34)
Albumin: 2.8 g/dL — ABNORMAL LOW (ref 3.5–5.0)
BILIRUBIN TOTAL: 0.45 mg/dL (ref 0.20–1.20)
BUN: 8.8 mg/dL (ref 7.0–26.0)
CO2: 26 mEq/L (ref 22–29)
CREATININE: 0.8 mg/dL (ref 0.6–1.1)
Calcium: 8.3 mg/dL — ABNORMAL LOW (ref 8.4–10.4)
Chloride: 109 mEq/L (ref 98–109)
EGFR: 85 mL/min/{1.73_m2} — ABNORMAL LOW (ref >90)
Glucose: 206 mg/dl — ABNORMAL HIGH (ref 70–140)
Potassium: 3.6 mEq/L (ref 3.5–5.1)
SODIUM: 144 meq/L (ref 136–145)
Total Protein: 6.5 g/dL (ref 6.4–8.3)

## 2014-11-21 LAB — CBC WITH DIFFERENTIAL/PLATELET
BASO%: 0.4 % (ref 0.0–2.0)
BASOS ABS: 0 10*3/uL (ref 0.0–0.1)
EOS%: 3.6 % (ref 0.0–7.0)
Eosinophils Absolute: 0.2 10*3/uL (ref 0.0–0.5)
HCT: 33.2 % — ABNORMAL LOW (ref 34.8–46.6)
HEMOGLOBIN: 10.5 g/dL — AB (ref 11.6–15.9)
LYMPH#: 1.4 10*3/uL (ref 0.9–3.3)
LYMPH%: 30.3 % (ref 14.0–49.7)
MCH: 24.2 pg — ABNORMAL LOW (ref 25.1–34.0)
MCHC: 31.6 g/dL (ref 31.5–36.0)
MCV: 76.6 fL — AB (ref 79.5–101.0)
MONO#: 0.5 10*3/uL (ref 0.1–0.9)
MONO%: 10.5 % (ref 0.0–14.0)
NEUT%: 55.2 % (ref 38.4–76.8)
NEUTROS ABS: 2.5 10*3/uL (ref 1.5–6.5)
Platelets: 237 10*3/uL (ref 145–400)
RBC: 4.33 10*6/uL (ref 3.70–5.45)
RDW: 18.1 % — AB (ref 11.2–14.5)
WBC: 4.5 10*3/uL (ref 3.9–10.3)

## 2014-11-21 MED ORDER — PREDNISONE 50 MG PO TABS: ORAL_TABLET | ORAL | Status: DC

## 2014-11-21 MED ORDER — FULVESTRANT 250 MG/5ML IM SOLN
500.0000 mg | Freq: Once | INTRAMUSCULAR | Status: AC
  Administered 2014-11-21: 500 mg via INTRAMUSCULAR
  Filled 2014-11-21: qty 10

## 2014-11-21 MED ORDER — LORAZEPAM 0.5 MG PO TABS: ORAL_TABLET | ORAL | Status: DC

## 2014-11-21 MED ORDER — PREDNISONE 5 MG PO TABS: ORAL_TABLET | ORAL | Status: DC

## 2014-11-21 NOTE — Telephone Encounter (Signed)
Avs & calender given in scheduling.

## 2014-11-21 NOTE — Addendum Note (Signed)
Addended by: Marcelino Duster on: 11/21/2014 01:43 PM   Modules accepted: Orders

## 2014-11-21 NOTE — Progress Notes (Signed)
ID: Christy Harrell   DOB: 09-May-1941  MR#: [id number]  YTK#:[id number]  PCP: [name], MD GYN:  SU:  OTHER MD:  Alysia Penna, Frederik Pear, Faustino Congress  CC: Breast cancer stage IV  TREATMENT: exemestane, everolimus, fulvestrant, and denosumab  BREAST CANCER HISTORY: From the original intake note:  The patient had routine screening mammography 10/16/2010 showing a lobulated mass in the right subareolar region measuring up to 5.5 cm. The Left breast showed some central microcalcifications. Biopsy of the Right breast mass on 10/28/2010 showed an invasive ductal carcinoma, grade 2, estrogen receptor positive (Allred score 8), progesterone receptor positive (Allred score 5), with an equivocal HER-2. The Left breast area of microcalcifications was biopsied at the same time, and was read as suspicious for DCIS.  On 11/17/2010 the patient underwent Right lumpectomy and axillary lymph node dissection for what proved to be a 3 cm invasive ductal carcinoma, grade 2, with some papillary and mucinous features. One of 16 lymph nodes was involved. FISH showed no HER-2 amplification. Left breast biopsy was benign.  The patient had an Oncotype sent, with a score of 27, predicting a risk of distant recurrence after 5 years of tamoxifen in the 18% range. With this intermediate result, the decision was made not to proceed with chemotherapy, since the patient is the sole caregiver to her very elderly mother. Instead the patient proceeded to radiation treatment which was completed 03/06/2011. She started anastrozole at that point. Her subsequent history is as detailed below   INTERVAL HISTORY: Lataysha returns today for follow up of her metastatic breast cancer. She continues on exemestane and everolimus, with fulvestrant monthly and denosumab every 2 month. She has hot flashes, but denies vaginal changes, arthralgias/myalgials. She has no mucositis or evidence of  pneumonitis either. The interval history is generally unremarkable. She is ready for a big trip with her daughter and granddaughter next month.   REVIEW OF SYSTEMS: Avamae denies, fevers or chills. She was switched to a new oral diabetes medication and she believes this is the source of her new abdominal discomfort and gas. She uses colace and miralax PRN for constipation. Her appetite has always been an issue, and she continues to lose weight. She is on rare occasions nauseous if she tries to eat too much. She continues to wear a sleep to her right arm for her chronic upper extremity lymphedema. She has some pain to her back and joints, but does not use her oxycodone regularly. She is short of breath with exhaustion, but denies chest pain, cough, or palpitations. A detailed review of systems is otherwise stable.  PAST MEDICAL HISTORY: Past Medical History  Diagnosis Date  . Cancer     right breast  . Club foot   . Arthritis   . Thyroid disease   . Hyperlipidemia   . Diabetes mellitus   . Osteoporosis   . Hypertension   . GERD (gastroesophageal reflux disease)     watches diet  . Neuromuscular disorder     lt arm numb sometimes  . Wears glasses   . 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 History of radiation therapy 02/22/13- 03/13/13    left proximal humerus 3500 cGy 14 sessions  . Lymphedema     right arm  . Metastasis from malignant tumor of breast     liver    PAST SURGICAL HISTORY: Past Surgical History  Procedure Laterality Date  . Cesarean section    .  Thyroidectomy  2002  . Foot surgery      as a child for club foot  . Breast surgery  2012    rt lump-16 nodes-in [place]  . Cesarean section    . Colonoscopy    . Dilation and curettage of uterus    . Humerus im nail Left 12/27/2012    Procedure: INTRAMEDULLARY (IM) NAIL HUMERAL LEFT PATHOLOGIC FRACTURE;  Surgeon: Nita Sells, MD;  Location: [facility];  Service:  Orthopedics;  Laterality: Left;    FAMILY HISTORY Family History  Problem Relation Age of Onset  . Cancer Neg Hx   . Arthritis Neg Hx   . Heart disease Neg Hx   . Hyperlipidemia Neg Hx   . Hypertension Neg Hx   . Dementia Mother   . Hearing loss Mother    The patient's father died from unknown causes. The patient's mother is alive at age 58. The patient was a single child. There is no history of breast or ovarian cancer in the family to her knowledge.  GYNECOLOGIC HISTORY: Menarche age 67, menopause in her early 58s. The patient is GX P1, first pregnancy to term age 43. She did not take hormone replacement  SOCIAL HISTORY: The patient grew up in [place], attended [employer], then moved to [place] where she lived about 40 years. 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 Her mother, [name] away at 100, on 09/09/14. The patient's daughter [name], 51, is disabled secondary to pulmonary hypertension. She can be reached at [phone]. The patient has 2 grandchildren, [name] who works at a Scientist, physiological business and is 75 years old, in general, 68, completing high school.   ADVANCED DIRECTIVES:not in place  HEALTH MAINTENANCE: History  Substance Use Topics  . Smoking status: Former Smoker    Quit date: 12/23/1974  . Smokeless tobacco: Never Used  . Alcohol Use: No     Colonoscopy:  PAP:    Bone density:  2012, on ibandronate chronically  Lipid panel: UTD  Allergies  Allergen Reactions  . Gadolinium Derivatives Other (See Comments)    Pt began having chest numbness/tingling , stated her heart felt funny, had to take her to ED for EKG per RN   CAP    Current Outpatient Prescriptions  Medication Sig Dispense Refill  . aspirin EC 81 MG tablet Take 1 tablet (81 mg total) by mouth daily.    Marland Kitchen atorvastatin (LIPITOR) 40 MG tablet Take 1 tablet (40 mg total) by mouth daily. 30 tablet 11  . diclofenac sodium (VOLTAREN) 1 %  GEL Apply 2 g topically 4 (four) times daily. 2 Tube 2  . Dietary Management Product (VASCULERA) TABS Take 1 capsule by mouth daily. 30 tablet 11  . everolimus (AFINITOR) 10 MG tablet Take 1 tablet (10 mg total) by mouth daily. 28 tablet 2  . exemestane (AROMASIN) 25 MG tablet Take 1 tablet (25 mg total) by mouth daily. 30 tablet 2  . Exenatide ER (BYDUREON) 2 MG PEN Inject 1 Act into the skin once a week. 4 each 11  . ezetimibe (ZETIA) 10 MG tablet Take 1 tablet (10 mg total) by mouth daily. 90 tablet 3  . furosemide (LASIX) 40 MG tablet Take 1 tablet (40 mg total) by mouth daily. 30 tablet 6  . levothyroxine (SYNTHROID) 200 MCG tablet Take 1 tablet (200 mcg total) by mouth daily before breakfast. 90 tablet 1  . lubiprostone (AMITIZA) 24 MCG  capsule Take 1 capsule (24 mcg total) by mouth 2 (two) times daily with a meal. 180 capsule 1  . metFORMIN (GLUCOPHAGE) 500 MG tablet Take 1 tablet (500 mg total) by mouth daily. 90 tablet 3  . Polyethyl Glycol-Propyl Glycol (SYSTANE ULTRA OP) Place 1 drop into both eyes daily.    . potassium chloride SA (K-DUR,KLOR-CON) 20 MEQ tablet Take 1 tablet (20 mEq total) by mouth 2 (two) times daily. 180 tablet 1  . pregabalin (LYRICA) 75 MG capsule Take 1 capsule (75 mg total) by mouth 2 (two) times daily. Not taking 60 capsule 5  . prochlorperazine (COMPAZINE) 5 MG tablet Take 1 tablet (5 mg total) by mouth every 6 (six) hours as needed for nausea or vomiting. 90 tablet 6  . docusate sodium (COLACE) 100 MG capsule Take 100 mg by mouth 2 (two) times daily as needed for constipation.    Marland Kitchen LORazepam (ATIVAN) 0.5 MG tablet Take 15 minutes before MRI test; may repeat x 1 10 tablet 0  . meloxicam (MOBIC) 7.5 MG tablet Take 1 tablet (7.5 mg total) by mouth daily. (Patient not taking: Reported on 11/21/2014) 20 tablet 0  . oxyCODONE (OXY IR/ROXICODONE) 5 MG immediate release tablet Take 1 tablet (5 mg total) by mouth every 4 (four) hours as needed for severe pain. Take 1-2  tablets every 4-6 hours as needed for pain - fill on or after 05/09/14 (Patient not taking: Reported on 11/21/2014) 100 tablet 0  . polyethylene glycol powder (GLYCOLAX/MIRALAX) powder Take 255 g (1 Container total) by mouth daily. (Patient not taking: Reported on 11/21/2014) 255 g 3   No current facility-administered medications for this visit.   PHYSICAL EXAM: Elderly African American woman in no acute distress  Filed Vitals:   11/21/14 1119  BP: 142/62  Pulse: 86  Temp: 98.8 F (37.1 C)  Resp: 18   Body mass index is 34.7 kg/(m^2).    ECOG FS: 2 Filed Weights   11/21/14 1119  Weight: 177 lb 11.2 oz (80.604 kg)   Skin: warm, dry  HEENT: sclerae anicteric, conjunctivae pink, oropharynx clear. No thrush or mucositis.  Lymph Nodes: No cervical or supraclavicular lymphadenopathy  Lungs: clear to auscultation bilaterally, no rales, wheezes, or rhonci  Heart: regular rate and rhythm  Abdomen: round, soft, non tender, positive bowel sounds  Musculoskeletal: No focal spinal tenderness, chronic grade 3 right upper extremity lymphedema Neuro: non focal, well oriented, positive affect  Breasts: deferred  LAB RESULTS: Lab Results  Component Value Date   WBC 4.5 11/21/2014   NEUTROABS 2.5 11/21/2014   HGB 10.5* 11/21/2014   HCT 33.2* 11/21/2014   MCV 76.6* 11/21/2014   PLT 237 11/21/2014      Chemistry      Component Value Date/Time   NA 144 11/21/2014 1047   NA 142 10/19/2014 1246   K 3.6 11/21/2014 1047   K 3.1* 10/19/2014 1246   CL 107 10/19/2014 1246   CL 105 01/23/2013 1337   CO2 26 11/21/2014 1047   CO2 32 10/19/2014 1246   BUN 8.8 11/21/2014 1047   BUN 11 10/19/2014 1246   CREATININE 0.8 11/21/2014 1047   CREATININE 0.77 10/19/2014 1246      Component Value Date/Time   CALCIUM 8.3* 11/21/2014 1047   CALCIUM 9.2 10/19/2014 1246   ALKPHOS 79 11/21/2014 1047   ALKPHOS 74 09/04/2014 2013   AST 20 11/21/2014 1047   AST 37 09/04/2014 2013   ALT 11 11/21/2014 1047  ALT 20 09/04/2014 2013   BILITOT 0.45 11/21/2014 1047   BILITOT 0.7 09/04/2014 2013       Lab Results  Component Value Date   LABCA2 24 11/19/2011    STUDIES: Dg Foot Complete Right  Dec 01, 2014   3 views of a skeletally mature individual were obtained of the right foot.  Study includes AP, oblique, lateral projections.  Cavus form is present of the synovium plantar flexed first metatarsal.  Destruction the talus is present. This is a result of chronic clubfoot.  There is no evidence of acute fracture identified at this time.   ASSESSMENT: 74 y.o.  [place] woman with stage IV [bone only] breast cancer in a  (1)  status post right lumpectomy and axillary lymph node dissection 11/17/2010 for a pT2 pN1, stage IIB invasive ductal carcinoma, grade 2, estrogen and progesterone receptor positive, HER-2 negative,   (2) Oncotype DX recurrence score of 27 predicting a risk of distant recurrence of 18% with 5 years of tamoxifen (intermediate score);   (3)  status post radiation completed July of 2012,   (4) started anastrozole July 2012, discontinued June 2014 with evidence of metastatic spread to bone  (5) Chronic lymphedema in the right upper extremity.  (6) pathologic fracture of the left humerus along apparent lytic lesion, with no other bone lesions per bone scan 12/12/2012  (7) on 12/27/2012 underwent #1 intramedullary nail left pathologic proximal humerus fracture  #2 left shoulder rotator cuff repair  #3 left shoulder open bone biopsy with pathology showing metastatic breast adenocarcinoma, estrogen receptor 100% positive, progesterone receptor and HER-2 negative, with an MIB-1 of 26%. PET scan 01/13/2013 showed no other areas of disease and  (8) status post 35 Gy to the left proximal humerus completed 03/13/2013(  (9) fulvestrant, first injections on 01/23/2013  (10) zolendronic acid given monthly, first dose 01/23/2013; changed to denosumab as of August 2015 because of  access problems  (11) multiple liver lesions noted on scans April 2015  (12) letrozole added May 2015, stopped 04/18/14 because of progression in her liver noted on a chest CT performed on 03/14/14  (13) everolimus and exemestane started 04/19/14   PLAN:  Velena is doing well today. The labs were reviewed in detail and were entirely stable with the exception of an elevated blood sugar. She meets with the PCP this upcoming Monday to discuss the changes he has made to her diabetes medications anyway. For now she will continue as directed. She is tolerating the everolimus and exemestane well and will continue these drugs daily. She is also due for a fulvestrant injection today.   Kobi plans to be out of town for 3 weeks in May and will have a faslodex injection in the first week of that month before she leaves. When she returns she will have both the denosumab and faslodex on 5/30. She will return in 3 months for a follow up visit with Dr. [name]. Prior to this visit she will have a repeat abdominal MRI. I have written a prescription for ativan to relieve her anxiety before this scan. I will also be sending in a prescription for prednisone premeds as she is allergic to IV contrast. She understands and agrees with this plan. She knows the goal of treatment in her case is control. She has been encouraged to call with any issues that might arise before her next visit here.  Total time spent in this appointment was 25 minutes, with greater than 50% of the time spent face  to face with the patient. Laurie Panda, NP  11/21/2014

## 2014-11-26 ENCOUNTER — Ambulatory Visit (INDEPENDENT_AMBULATORY_CARE_PROVIDER_SITE_OTHER): Payer: Medicare Other | Admitting: Internal Medicine

## 2014-11-26 ENCOUNTER — Encounter: Payer: Self-pay | Admitting: Internal Medicine

## 2014-11-26 ENCOUNTER — Other Ambulatory Visit: Payer: Self-pay | Admitting: Nurse Practitioner

## 2014-11-26 VITALS — BP 118/58 | HR 90 | Temp 98.1°F | Resp 16 | Wt 181.0 lb

## 2014-11-26 DIAGNOSIS — E1165 Type 2 diabetes mellitus with hyperglycemia: Secondary | ICD-10-CM

## 2014-11-26 DIAGNOSIS — E1140 Type 2 diabetes mellitus with diabetic neuropathy, unspecified: Secondary | ICD-10-CM

## 2014-11-26 DIAGNOSIS — IMO0002 Reserved for concepts with insufficient information to code with codable children: Secondary | ICD-10-CM

## 2014-11-26 DIAGNOSIS — R21 Rash and other nonspecific skin eruption: Secondary | ICD-10-CM | POA: Diagnosis not present

## 2014-11-26 DIAGNOSIS — C787 Secondary malignant neoplasm of liver and intrahepatic bile duct: Secondary | ICD-10-CM

## 2014-11-26 MED ORDER — DESONIDE 0.05 % EX LOTN: TOPICAL_LOTION | Freq: Two times a day (BID) | CUTANEOUS | Status: DC

## 2014-11-26 NOTE — Patient Instructions (Signed)

## 2014-11-26 NOTE — Progress Notes (Signed)
Pre visit review using our clinic review tool, if applicable. No additional management support is needed unless otherwise documented below in the visit note. 

## 2014-11-27 NOTE — Assessment & Plan Note (Signed)
The timing of starting bydureon and the rash fits with a drug allergy She stopped bydureon and that rash has improved There is still some facial involvement so will try low potency steroid for symptom relief She will also start an OTC antihistamine

## 2014-11-27 NOTE — Progress Notes (Signed)
Subjective:    Patient ID: Christy Harrell, female    DOB: [birth year], 74 y.o.   MRN: [id number]  HPI Comments: She has developed a red itchy rash that she believes was caused by bydureon - the rash started on abd and torso 2-3 weeks ago, she stopped using bydureon and now rash is only on her face. She has not treated it.  Rash This is a new problem. The current episode started 1 to 4 weeks ago. The problem has been gradually improving since onset. The rash is diffuse. The rash is characterized by redness and itchiness. She was exposed to a new medication. Associated symptoms include joint pain. Pertinent negatives include no anorexia, congestion, cough, diarrhea, eye pain, facial edema, fatigue, fever, rhinorrhea, shortness of breath, sore throat or vomiting. Past treatments include nothing.  Diabetes Pertinent negatives for hypoglycemia include no pallor. Pertinent negatives for diabetes include no chest pain and no fatigue.      Review of Systems  Constitutional: Negative.  Negative for fever, chills, diaphoresis, appetite change and fatigue.  HENT: Negative.  Negative for congestion, rhinorrhea, sore throat, trouble swallowing and voice change.   Eyes: Negative.  Negative for pain.  Respiratory: Negative.  Negative for cough, choking, shortness of breath and stridor.   Cardiovascular: Negative.  Negative for chest pain, palpitations and leg swelling.  Gastrointestinal: Negative.  Negative for vomiting, abdominal pain, diarrhea and anorexia.  Endocrine: Negative.   Genitourinary: Negative.   Musculoskeletal: Positive for back pain, joint pain and arthralgias. Negative for myalgias, joint swelling, gait problem and neck pain.  Skin: Positive for rash. Negative for pallor and wound.  Allergic/Immunologic: Negative.   Neurological: Negative.   Hematological: Negative.  Negative for adenopathy. Does not bruise/bleed easily.  Psychiatric/Behavioral: Negative.        Objective:   Physical Exam  Constitutional: She is oriented to person, place, and time. She appears well-developed and well-nourished. No distress.  HENT:  Head: Normocephalic and atraumatic.  Mouth/Throat: Oropharynx is clear and moist. No oropharyngeal exudate.  Eyes: Conjunctivae are normal. Right eye exhibits no discharge. Left eye exhibits no discharge. No scleral icterus.  Neck: Normal range of motion. Neck supple. No JVD present. No tracheal deviation present. No thyromegaly present.  Cardiovascular: Normal rate, regular rhythm, normal heart sounds and intact distal pulses.  Exam reveals no gallop and no friction rub.   No murmur heard. Pulmonary/Chest: Effort normal and breath sounds normal. No stridor. No respiratory distress. She has no wheezes. She has no rales. She exhibits no tenderness.  Abdominal: Soft. Bowel sounds are normal. She exhibits no distension and no mass. There is no tenderness. There is no rebound and no guarding.  Musculoskeletal: Normal range of motion. She exhibits no edema or tenderness.  Lymphadenopathy:    She has no cervical adenopathy.  Neurological: She is oriented to person, place, and time.  Skin: Skin is warm, dry and intact. Rash noted. No abrasion, no bruising, no burn, no ecchymosis, no laceration, no lesion, no petechiae and no purpura noted. Rash is pustular. Rash is not macular, not papular, not maculopapular, not nodular, not vesicular and not urticarial. She is not diaphoretic. No cyanosis or erythema. No pallor. Nails show no clubbing.  There are faint erythematous papules over her face but no rash elsewhere  Psychiatric: She has a normal mood and affect. Her behavior is normal. Judgment and thought content normal.  Vitals reviewed.    Lab Results  Component Value Date   WBC 4.5 11/21/2014  HGB 10.5* 11/21/2014   HCT 33.2* 11/21/2014   PLT 237 11/21/2014   GLUCOSE 206* 11/21/2014   CHOL 217* 03/05/2014   TRIG 87.0 03/05/2014   HDL 66.80 03/05/2014     LDLDIRECT 160.0 03/16/2013   LDLCALC 133* 03/05/2014   ALT 11 11/21/2014   AST 20 11/21/2014   NA 144 11/21/2014   K 3.6 11/21/2014   CL 107 10/19/2014   CREATININE 0.8 11/21/2014   BUN 8.8 11/21/2014   CO2 26 11/21/2014   TSH 6.45* 10/19/2014   HGBA1C 7.1* 10/19/2014   MICROALBUR 7.3* 10/19/2014       Assessment & Plan:

## 2014-11-27 NOTE — Assessment & Plan Note (Signed)
Will stop bydureon

## 2014-12-10 ENCOUNTER — Ambulatory Visit (HOSPITAL_BASED_OUTPATIENT_CLINIC_OR_DEPARTMENT_OTHER): Payer: Medicare Other

## 2014-12-10 ENCOUNTER — Other Ambulatory Visit (HOSPITAL_BASED_OUTPATIENT_CLINIC_OR_DEPARTMENT_OTHER): Payer: Medicare Other

## 2014-12-10 VITALS — BP 120/65 | HR 91 | Temp 98.6°F | Resp 16

## 2014-12-10 DIAGNOSIS — Z5111 Encounter for antineoplastic chemotherapy: Secondary | ICD-10-CM | POA: Diagnosis not present

## 2014-12-10 DIAGNOSIS — C50512 Malignant neoplasm of lower-outer quadrant of left female breast: Secondary | ICD-10-CM

## 2014-12-10 DIAGNOSIS — C50111 Malignant neoplasm of central portion of right female breast: Secondary | ICD-10-CM

## 2014-12-10 DIAGNOSIS — C787 Secondary malignant neoplasm of liver and intrahepatic bile duct: Secondary | ICD-10-CM | POA: Diagnosis not present

## 2014-12-10 DIAGNOSIS — M899 Disorder of bone, unspecified: Secondary | ICD-10-CM

## 2014-12-10 DIAGNOSIS — C7951 Secondary malignant neoplasm of bone: Secondary | ICD-10-CM

## 2014-12-10 LAB — CBC WITH DIFFERENTIAL/PLATELET
BASO%: 0.2 % (ref 0.0–2.0)
Basophils Absolute: 0 10*3/uL (ref 0.0–0.1)
EOS%: 3.5 % (ref 0.0–7.0)
Eosinophils Absolute: 0.2 10*3/uL (ref 0.0–0.5)
HCT: 31.9 % — ABNORMAL LOW (ref 34.8–46.6)
HEMOGLOBIN: 10.4 g/dL — AB (ref 11.6–15.9)
LYMPH%: 27.5 % (ref 14.0–49.7)
MCH: 25 pg — ABNORMAL LOW (ref 25.1–34.0)
MCHC: 32.7 g/dL (ref 31.5–36.0)
MCV: 76.6 fL — AB (ref 79.5–101.0)
MONO#: 0.3 10*3/uL (ref 0.1–0.9)
MONO%: 6.4 % (ref 0.0–14.0)
NEUT%: 62.4 % (ref 38.4–76.8)
NEUTROS ABS: 2.8 10*3/uL (ref 1.5–6.5)
Platelets: 271 10*3/uL (ref 145–400)
RBC: 4.16 10*6/uL (ref 3.70–5.45)
RDW: 16.9 % — ABNORMAL HIGH (ref 11.2–14.5)
WBC: 4.5 10*3/uL (ref 3.9–10.3)
lymph#: 1.2 10*3/uL (ref 0.9–3.3)

## 2014-12-10 LAB — COMPREHENSIVE METABOLIC PANEL (CC13)
ALK PHOS: 65 U/L (ref 40–150)
ALT: 11 U/L (ref 0–55)
AST: 21 U/L (ref 5–34)
Albumin: 2.9 g/dL — ABNORMAL LOW (ref 3.5–5.0)
Anion Gap: 9 mEq/L (ref 3–11)
BUN: 10.3 mg/dL (ref 7.0–26.0)
CO2: 25 meq/L (ref 22–29)
CREATININE: 0.8 mg/dL (ref 0.6–1.1)
Calcium: 8.8 mg/dL (ref 8.4–10.4)
Chloride: 109 mEq/L (ref 98–109)
EGFR: 81 mL/min/{1.73_m2} — ABNORMAL LOW (ref >90)
Glucose: 170 mg/dl — ABNORMAL HIGH (ref 70–140)
POTASSIUM: 3.3 meq/L — AB (ref 3.5–5.1)
Sodium: 143 mEq/L (ref 136–145)
TOTAL PROTEIN: 6.7 g/dL (ref 6.4–8.3)
Total Bilirubin: 0.4 mg/dL (ref 0.20–1.20)

## 2014-12-10 MED ORDER — FULVESTRANT 250 MG/5ML IM SOLN
500.0000 mg | Freq: Once | INTRAMUSCULAR | Status: AC
  Administered 2014-12-10: 500 mg via INTRAMUSCULAR
  Filled 2014-12-10: qty 10

## 2014-12-10 MED ORDER — DENOSUMAB 120 MG/1.7ML ~~LOC~~ SOLN
120.0000 mg | Freq: Once | SUBCUTANEOUS | Status: AC
  Administered 2014-12-10: 120 mg via SUBCUTANEOUS
  Filled 2014-12-10: qty 1.7

## 2014-12-10 NOTE — Patient Instructions (Signed)
Denosumab injection What is this medicine? DENOSUMAB (den oh sue mab) slows bone breakdown. Prolia is used to treat osteoporosis in women after menopause and in men. Xgeva is used to prevent bone fractures and other bone problems caused by cancer bone metastases. Xgeva is also used to treat giant cell tumor of the bone. This medicine may be used for other purposes; ask your health care provider or pharmacist if you have questions. COMMON BRAND NAME(S): Prolia, XGEVA What should I tell my health care provider before I take this medicine? They need to know if you have any of these conditions: -dental disease -eczema -infection or history of infections -kidney disease or on dialysis -low blood calcium or vitamin D -malabsorption syndrome -scheduled to have surgery or tooth extraction -taking medicine that contains denosumab -thyroid or parathyroid disease -an unusual reaction to denosumab, other medicines, foods, dyes, or preservatives -pregnant or trying to get pregnant -breast-feeding How should I use this medicine? This medicine is for injection under the skin. It is given by a health care professional in a hospital or clinic setting. If you are getting Prolia, a special MedGuide will be given to you by the pharmacist with each prescription and refill. Be sure to read this information carefully each time. For Prolia, talk to your pediatrician regarding the use of this medicine in children. Special care may be needed. For Xgeva, talk to your pediatrician regarding the use of this medicine in children. While this drug may be prescribed for children as young as 13 years for selected conditions, precautions do apply. Overdosage: If you think you've taken too much of this medicine contact a poison control center or emergency room at once. Overdosage: If you think you have taken too much of this medicine contact a poison control center or emergency room at once. NOTE: This medicine is only for  you. Do not share this medicine with others. What if I miss a dose? It is important not to miss your dose. Call your doctor or health care professional if you are unable to keep an appointment. What may interact with this medicine? Do not take this medicine with any of the following medications: -other medicines containing denosumab This medicine may also interact with the following medications: -medicines that suppress the immune system -medicines that treat cancer -steroid medicines like prednisone or cortisone This list may not describe all possible interactions. Give your health care provider a list of all the medicines, herbs, non-prescription drugs, or dietary supplements you use. Also tell them if you smoke, drink alcohol, or use illegal drugs. Some items may interact with your medicine. What should I watch for while using this medicine? Visit your doctor or health care professional for regular checks on your progress. Your doctor or health care professional may order blood tests and other tests to see how you are doing. Call your doctor or health care professional if you get a cold or other infection while receiving this medicine. Do not treat yourself. This medicine may decrease your body's ability to fight infection. You should make sure you get enough calcium and vitamin D while you are taking this medicine, unless your doctor tells you not to. Discuss the foods you eat and the vitamins you take with your health care professional. See your dentist regularly. Brush and floss your teeth as directed. Before you have any dental work done, tell your dentist you are receiving this medicine. Do not become pregnant while taking this medicine or for 5 months after stopping   it. Women should inform their doctor if they wish to become pregnant or think they might be pregnant. There is a potential for serious side effects to an unborn child. Talk to your health care professional or pharmacist for more  information. What side effects may I notice from receiving this medicine? Side effects that you should report to your doctor or health care professional as soon as possible: -allergic reactions like skin rash, itching or hives, swelling of the face, lips, or tongue -breathing problems -chest pain -fast, irregular heartbeat -feeling faint or lightheaded, falls -fever, chills, or any other sign of infection -muscle spasms, tightening, or twitches -numbness or tingling -skin blisters or bumps, or is dry, peels, or red -slow healing or unexplained pain in the mouth or jaw -unusual bleeding or bruising Side effects that usually do not require medical attention (Report these to your doctor or health care professional if they continue or are bothersome.): -muscle pain -stomach upset, gas This list may not describe all possible side effects. Call your doctor for medical advice about side effects. You may report side effects to FDA at 1-800-FDA-1088. Where should I keep my medicine? This medicine is only given in a clinic, doctor's office, or other health care setting and will not be stored at home. NOTE: This sheet is a summary. It may not cover all possible information. If you have questions about this medicine, talk to your doctor, pharmacist, or health care provider.  2015, Elsevier/Gold Standard. (2012-01-25 12:37:47)  Fulvestrant injection What is this medicine? FULVESTRANT (ful VES trant) blocks the effects of estrogen. It is used to treat breast cancer in women past the age of menopause. This medicine may be used for other purposes; ask your health care provider or pharmacist if you have questions. COMMON BRAND NAME(S): FASLODEX What should I tell my health care provider before I take this medicine? They need to know if you have any of these conditions: -bleeding problems -liver disease -low levels of platelets in the blood -an unusual or allergic reaction to fulvestrant, other  medicines, foods, dyes, or preservatives -pregnant or trying to get pregnant -breast-feeding How should I use this medicine? This medicine is for injection into a muscle. It is usually given by a health care professional in a hospital or clinic setting. Talk to your pediatrician regarding the use of this medicine in children. Special care may be needed. Overdosage: If you think you have taken too much of this medicine contact a poison control center or emergency room at once. NOTE: This medicine is only for you. Do not share this medicine with others. What if I miss a dose? It is important not to miss your dose. Call your doctor or health care professional if you are unable to keep an appointment. What may interact with this medicine? -medicines that treat or prevent blood clots like warfarin, enoxaparin, and dalteparin This list may not describe all possible interactions. Give your health care provider a list of all the medicines, herbs, non-prescription drugs, or dietary supplements you use. Also tell them if you smoke, drink alcohol, or use illegal drugs. Some items may interact with your medicine. What should I watch for while using this medicine? Your condition will be monitored carefully while you are receiving this medicine. You will need important blood work done while you are taking this medicine. Do not become pregnant while taking this medicine. Women should inform their doctor if they wish to become pregnant or think they might be pregnant. There is   a potential for serious side effects to an unborn child. Talk to your health care professional or pharmacist for more information. What side effects may I notice from receiving this medicine? Side effects that you should report to your doctor or health care professional as soon as possible: -allergic reactions like skin rash, itching or hives, swelling of the face, lips, or tongue -feeling faint or lightheaded, falls -fever or flu-like  symptoms -sore throat -vaginal bleeding Side effects that usually do not require medical attention (report to your doctor or health care professional if they continue or are bothersome): -aches, pains -constipation or diarrhea -headache -hot flashes -nausea, vomiting -pain at site where injected -stomach pain This list may not describe all possible side effects. Call your doctor for medical advice about side effects. You may report side effects to FDA at 1-800-FDA-1088. Where should I keep my medicine? This drug is given in a hospital or clinic and will not be stored at home. NOTE: This sheet is a summary. It may not cover all possible information. If you have questions about this medicine, talk to your doctor, pharmacist, or health care provider.  2015, Elsevier/Gold Standard. (2007-12-05 15:39:24)  

## 2014-12-10 NOTE — Progress Notes (Signed)
Call received from [facility] in lab - pt requesting letter before she leaves today to allow her to take her lymphedema equipment on the plane.  Tells Maudie Mercury it looks like a gun.  When asked, pt states it is not a machine.  Saw patient in the injection room.  Patient reports she requested letter a month ago and "they told her" they would give Val the message.  Reports she has gotten a letter for this before.  Confirms the eqpt is not a machine and has velcro straps she has to attach at night.  Letter printed and provided to patient.

## 2014-12-28 ENCOUNTER — Ambulatory Visit: Payer: Medicare Other

## 2014-12-28 ENCOUNTER — Other Ambulatory Visit: Payer: Medicare Other

## 2015-01-02 ENCOUNTER — Ambulatory Visit: Payer: Medicare Other | Admitting: Podiatry

## 2015-01-08 ENCOUNTER — Other Ambulatory Visit: Payer: Medicare Other

## 2015-01-08 ENCOUNTER — Ambulatory Visit: Payer: Medicare Other

## 2015-01-10 ENCOUNTER — Other Ambulatory Visit (HOSPITAL_BASED_OUTPATIENT_CLINIC_OR_DEPARTMENT_OTHER): Payer: Medicare Other

## 2015-01-10 ENCOUNTER — Ambulatory Visit (HOSPITAL_BASED_OUTPATIENT_CLINIC_OR_DEPARTMENT_OTHER): Payer: Medicare Other

## 2015-01-10 VITALS — BP 120/55 | HR 81 | Temp 98.5°F

## 2015-01-10 DIAGNOSIS — C7951 Secondary malignant neoplasm of bone: Secondary | ICD-10-CM

## 2015-01-10 DIAGNOSIS — C50111 Malignant neoplasm of central portion of right female breast: Secondary | ICD-10-CM | POA: Diagnosis not present

## 2015-01-10 DIAGNOSIS — Z5111 Encounter for antineoplastic chemotherapy: Secondary | ICD-10-CM | POA: Diagnosis not present

## 2015-01-10 DIAGNOSIS — C773 Secondary and unspecified malignant neoplasm of axilla and upper limb lymph nodes: Secondary | ICD-10-CM

## 2015-01-10 DIAGNOSIS — M899 Disorder of bone, unspecified: Secondary | ICD-10-CM

## 2015-01-10 DIAGNOSIS — C787 Secondary malignant neoplasm of liver and intrahepatic bile duct: Secondary | ICD-10-CM

## 2015-01-10 LAB — CBC WITH DIFFERENTIAL/PLATELET
BASO%: 1.2 % (ref 0.0–2.0)
Basophils Absolute: 0.1 10*3/uL (ref 0.0–0.1)
EOS ABS: 0.1 10*3/uL (ref 0.0–0.5)
EOS%: 2.3 % (ref 0.0–7.0)
HCT: 31.7 % — ABNORMAL LOW (ref 34.8–46.6)
HEMOGLOBIN: 10.5 g/dL — AB (ref 11.6–15.9)
LYMPH#: 1.4 10*3/uL (ref 0.9–3.3)
LYMPH%: 28 % (ref 14.0–49.7)
MCH: 25.6 pg (ref 25.1–34.0)
MCHC: 33.2 g/dL (ref 31.5–36.0)
MCV: 77.2 fL — AB (ref 79.5–101.0)
MONO#: 0.6 10*3/uL (ref 0.1–0.9)
MONO%: 12.9 % (ref 0.0–14.0)
NEUT%: 55.6 % (ref 38.4–76.8)
NEUTROS ABS: 2.7 10*3/uL (ref 1.5–6.5)
PLATELETS: 265 10*3/uL (ref 145–400)
RBC: 4.1 10*6/uL (ref 3.70–5.45)
RDW: 15.4 % — AB (ref 11.2–14.5)
WBC: 4.9 10*3/uL (ref 3.9–10.3)

## 2015-01-10 LAB — COMPREHENSIVE METABOLIC PANEL (CC13)
ALK PHOS: 68 U/L (ref 40–150)
ALT: 13 U/L (ref 0–55)
AST: 24 U/L (ref 5–34)
Albumin: 2.9 g/dL — ABNORMAL LOW (ref 3.5–5.0)
Anion Gap: 9 mEq/L (ref 3–11)
BUN: 13 mg/dL (ref 7.0–26.0)
CHLORIDE: 104 meq/L (ref 98–109)
CO2: 28 mEq/L (ref 22–29)
Calcium: 8.9 mg/dL (ref 8.4–10.4)
Creatinine: 0.9 mg/dL (ref 0.6–1.1)
EGFR: 79 mL/min/{1.73_m2} — AB (ref >90)
Glucose: 136 mg/dl (ref 70–140)
Potassium: 3.2 mEq/L — ABNORMAL LOW (ref 3.5–5.1)
Sodium: 141 mEq/L (ref 136–145)
Total Bilirubin: 0.42 mg/dL (ref 0.20–1.20)
Total Protein: 6.8 g/dL (ref 6.4–8.3)

## 2015-01-10 MED ORDER — FULVESTRANT 250 MG/5ML IM SOLN
500.0000 mg | Freq: Once | INTRAMUSCULAR | Status: AC
  Administered 2015-01-10: 500 mg via INTRAMUSCULAR
  Filled 2015-01-10: qty 10

## 2015-01-11 ENCOUNTER — Other Ambulatory Visit: Payer: Medicare Other

## 2015-01-14 ENCOUNTER — Ambulatory Visit: Payer: Medicare Other | Admitting: Podiatry

## 2015-01-14 ENCOUNTER — Other Ambulatory Visit: Payer: Medicare Other

## 2015-01-25 ENCOUNTER — Other Ambulatory Visit: Payer: Medicare Other

## 2015-01-25 ENCOUNTER — Ambulatory Visit: Payer: Medicare Other

## 2015-01-28 ENCOUNTER — Encounter: Payer: Self-pay | Admitting: Podiatry

## 2015-01-28 ENCOUNTER — Ambulatory Visit (INDEPENDENT_AMBULATORY_CARE_PROVIDER_SITE_OTHER): Payer: Medicare Other | Admitting: Podiatry

## 2015-01-28 DIAGNOSIS — L84 Corns and callosities: Secondary | ICD-10-CM

## 2015-01-28 DIAGNOSIS — B351 Tinea unguium: Secondary | ICD-10-CM

## 2015-01-28 DIAGNOSIS — M79676 Pain in unspecified toe(s): Secondary | ICD-10-CM

## 2015-01-29 NOTE — Progress Notes (Signed)
Patient ID: Christy Harrell, female   DOB: 06-Mar-1941, 74 y.o.   MRN: [id number]  Subjective: 74 y.o.-year-old female returns the office today for painful, elongated, thickened toenails which she is unable to trim herself=. Denies any redness or drainage around the nails. Denies any acute changes since last appointment and no new complaints today. Denies any systemic complaints such as fevers, chills, nausea, vomiting.   Objective: AAO 3, NAD DP/PT pulses palpable, CRT less than 3 seconds; chronic appearing edema to bilateral lower extremities. Protective sensation intact with Derrel Nip monofilament, Achilles tendon reflex intact.  Nails hypertrophic, dystrophic, elongated, brittle, discolored 10. There is tenderness overlying the nails 1-5 bilaterally.. There is no surrounding erythema or drainage along the nail sites. Hyperkeratotic lesion bilateral submetatarsal one. Upon debridement no underlying ulceration, drainage or other clinical signs of infection. No open lesions or other pre-ulcerative lesions are identified. No other areas of tenderness bilateral lower extremities. No overlying edema, erythema, increased warmth. No pain with calf compression, swelling, warmth, erythema.  Assessment: Patient presents with symptomatic onychomycosis; hyperkeratotic lesions  Plan: -Treatment options including alternatives, risks, complications were discussed -Nails sharply debrided 10 without complication/bleeding. -Hyperkeratotic lesion sharply debrided without complications/bleeding. -Discussed daily foot inspection. If there are any changes, to call the office immediately.  -Follow-up with PCP for lower extremity edema. -Follow-up in 3 months or sooner if any problems are to arise. In the meantime, encouraged to call the office with any questions, concerns, changes symptoms.

## 2015-02-04 ENCOUNTER — Other Ambulatory Visit: Payer: Self-pay | Admitting: *Deleted

## 2015-02-04 ENCOUNTER — Other Ambulatory Visit (HOSPITAL_BASED_OUTPATIENT_CLINIC_OR_DEPARTMENT_OTHER): Payer: Medicare Other

## 2015-02-04 ENCOUNTER — Ambulatory Visit (HOSPITAL_BASED_OUTPATIENT_CLINIC_OR_DEPARTMENT_OTHER): Payer: Medicare Other

## 2015-02-04 VITALS — BP 135/54 | HR 65 | Temp 97.9°F | Resp 18

## 2015-02-04 DIAGNOSIS — Z5111 Encounter for antineoplastic chemotherapy: Secondary | ICD-10-CM | POA: Diagnosis not present

## 2015-02-04 DIAGNOSIS — C50512 Malignant neoplasm of lower-outer quadrant of left female breast: Secondary | ICD-10-CM

## 2015-02-04 DIAGNOSIS — C7951 Secondary malignant neoplasm of bone: Secondary | ICD-10-CM

## 2015-02-04 DIAGNOSIS — C787 Secondary malignant neoplasm of liver and intrahepatic bile duct: Secondary | ICD-10-CM | POA: Diagnosis not present

## 2015-02-04 LAB — COMPREHENSIVE METABOLIC PANEL (CC13)
ALBUMIN: 3 g/dL — AB (ref 3.5–5.0)
ALK PHOS: 69 U/L (ref 40–150)
ALT: 13 U/L (ref 0–55)
AST: 18 U/L (ref 5–34)
Anion Gap: 8 mEq/L (ref 3–11)
BUN: 9.3 mg/dL (ref 7.0–26.0)
CO2: 28 mEq/L (ref 22–29)
Calcium: 8.9 mg/dL (ref 8.4–10.4)
Chloride: 108 mEq/L (ref 98–109)
Creatinine: 0.9 mg/dL (ref 0.6–1.1)
EGFR: 74 mL/min/{1.73_m2} — AB (ref >90)
Glucose: 169 mg/dl — ABNORMAL HIGH (ref 70–140)
POTASSIUM: 3.6 meq/L (ref 3.5–5.1)
Sodium: 144 mEq/L (ref 136–145)
Total Bilirubin: 0.36 mg/dL (ref 0.20–1.20)
Total Protein: 6.8 g/dL (ref 6.4–8.3)

## 2015-02-04 LAB — CBC WITH DIFFERENTIAL/PLATELET
BASO%: 1 % (ref 0.0–2.0)
Basophils Absolute: 0 10*3/uL (ref 0.0–0.1)
EOS%: 2.3 % (ref 0.0–7.0)
Eosinophils Absolute: 0.1 10*3/uL (ref 0.0–0.5)
HCT: 32.4 % — ABNORMAL LOW (ref 34.8–46.6)
HGB: 10.6 g/dL — ABNORMAL LOW (ref 11.6–15.9)
LYMPH%: 27.4 % (ref 14.0–49.7)
MCH: 25.4 pg (ref 25.1–34.0)
MCHC: 32.8 g/dL (ref 31.5–36.0)
MCV: 77.4 fL — AB (ref 79.5–101.0)
MONO#: 0.5 10*3/uL (ref 0.1–0.9)
MONO%: 9.7 % (ref 0.0–14.0)
NEUT#: 3 10*3/uL (ref 1.5–6.5)
NEUT%: 59.6 % (ref 38.4–76.8)
PLATELETS: 222 10*3/uL (ref 145–400)
RBC: 4.19 10*6/uL (ref 3.70–5.45)
RDW: 15.5 % — ABNORMAL HIGH (ref 11.2–14.5)
WBC: 5 10*3/uL (ref 3.9–10.3)
lymph#: 1.4 10*3/uL (ref 0.9–3.3)

## 2015-02-04 MED ORDER — FULVESTRANT 250 MG/5ML IM SOLN
500.0000 mg | Freq: Once | INTRAMUSCULAR | Status: AC
  Administered 2015-02-04: 500 mg via INTRAMUSCULAR
  Filled 2015-02-04: qty 10

## 2015-02-04 MED ORDER — DENOSUMAB 120 MG/1.7ML ~~LOC~~ SOLN
120.0000 mg | Freq: Once | SUBCUTANEOUS | Status: AC
  Administered 2015-02-04: 120 mg via SUBCUTANEOUS
  Filled 2015-02-04: qty 1.7

## 2015-02-04 NOTE — Patient Instructions (Signed)
Fulvestrant injection What is this medicine? FULVESTRANT (ful VES trant) blocks the effects of estrogen. It is used to treat breast cancer in women past the age of menopause. This medicine may be used for other purposes; ask your health care provider or pharmacist if you have questions. COMMON BRAND NAME(S): FASLODEX What should I tell my health care provider before I take this medicine? They need to know if you have any of these conditions: -bleeding problems -liver disease -low levels of platelets in the blood -an unusual or allergic reaction to fulvestrant, other medicines, foods, dyes, or preservatives -pregnant or trying to get pregnant -breast-feeding How should I use this medicine? This medicine is for injection into a muscle. It is usually given by a health care professional in a hospital or clinic setting. Talk to your pediatrician regarding the use of this medicine in children. Special care may be needed. Overdosage: If you think you have taken too much of this medicine contact a poison control center or emergency room at once. NOTE: This medicine is only for you. Do not share this medicine with others. What if I miss a dose? It is important not to miss your dose. Call your doctor or health care professional if you are unable to keep an appointment. What may interact with this medicine? -medicines that treat or prevent blood clots like warfarin, enoxaparin, and dalteparin This list may not describe all possible interactions. Give your health care provider a list of all the medicines, herbs, non-prescription drugs, or dietary supplements you use. Also tell them if you smoke, drink alcohol, or use illegal drugs. Some items may interact with your medicine. What should I watch for while using this medicine? Your condition will be monitored carefully while you are receiving this medicine. You will need important blood work done while you are taking this medicine. Do not become pregnant  while taking this medicine. Women should inform their doctor if they wish to become pregnant or think they might be pregnant. There is a potential for serious side effects to an unborn child. Talk to your health care professional or pharmacist for more information. What side effects may I notice from receiving this medicine? Side effects that you should report to your doctor or health care professional as soon as possible: -allergic reactions like skin rash, itching or hives, swelling of the face, lips, or tongue -feeling faint or lightheaded, falls -fever or flu-like symptoms -sore throat -vaginal bleeding Side effects that usually do not require medical attention (report to your doctor or health care professional if they continue or are bothersome): -aches, pains -constipation or diarrhea -headache -hot flashes -nausea, vomiting -pain at site where injected -stomach pain This list may not describe all possible side effects. Call your doctor for medical advice about side effects. You may report side effects to FDA at 1-800-FDA-1088. Where should I keep my medicine? This drug is given in a hospital or clinic and will not be stored at home. NOTE: This sheet is a summary. It may not cover all possible information. If you have questions about this medicine, talk to your doctor, pharmacist, or health care provider.  2015, Elsevier/Gold Standard. (2007-12-05 15:39:24)  Denosumab injection What is this medicine? DENOSUMAB (den oh sue mab) slows bone breakdown. Prolia is used to treat osteoporosis in women after menopause and in men. Delton See is used to prevent bone fractures and other bone problems caused by cancer bone metastases. Delton See is also used to treat giant cell tumor of the bone.  This medicine may be used for other purposes; ask your health care provider or pharmacist if you have questions. COMMON BRAND NAME(S): Prolia, XGEVA What should I tell my health care provider before I take this  medicine? They need to know if you have any of these conditions: -dental disease -eczema -infection or history of infections -kidney disease or on dialysis -low blood calcium or vitamin D -malabsorption syndrome -scheduled to have surgery or tooth extraction -taking medicine that contains denosumab -thyroid or parathyroid disease -an unusual reaction to denosumab, other medicines, foods, dyes, or preservatives -pregnant or trying to get pregnant -breast-feeding How should I use this medicine? This medicine is for injection under the skin. It is given by a health care professional in a hospital or clinic setting. If you are getting Prolia, a special MedGuide will be given to you by the pharmacist with each prescription and refill. Be sure to read this information carefully each time. For Prolia, talk to your pediatrician regarding the use of this medicine in children. Special care may be needed. For Delton See, talk to your pediatrician regarding the use of this medicine in children. While this drug may be prescribed for children as young as 13 years for selected conditions, precautions do apply. Overdosage: If you think you've taken too much of this medicine contact a poison control center or emergency room at once. Overdosage: If you think you have taken too much of this medicine contact a poison control center or emergency room at once. NOTE: This medicine is only for you. Do not share this medicine with others. What if I miss a dose? It is important not to miss your dose. Call your doctor or health care professional if you are unable to keep an appointment. What may interact with this medicine? Do not take this medicine with any of the following medications: -other medicines containing denosumab This medicine may also interact with the following medications: -medicines that suppress the immune system -medicines that treat cancer -steroid medicines like prednisone or cortisone This list  may not describe all possible interactions. Give your health care provider a list of all the medicines, herbs, non-prescription drugs, or dietary supplements you use. Also tell them if you smoke, drink alcohol, or use illegal drugs. Some items may interact with your medicine. What should I watch for while using this medicine? Visit your doctor or health care professional for regular checks on your progress. Your doctor or health care professional may order blood tests and other tests to see how you are doing. Call your doctor or health care professional if you get a cold or other infection while receiving this medicine. Do not treat yourself. This medicine may decrease your body's ability to fight infection. You should make sure you get enough calcium and vitamin D while you are taking this medicine, unless your doctor tells you not to. Discuss the foods you eat and the vitamins you take with your health care professional. See your dentist regularly. Brush and floss your teeth as directed. Before you have any dental work done, tell your dentist you are receiving this medicine. Do not become pregnant while taking this medicine or for 5 months after stopping it. Women should inform their doctor if they wish to become pregnant or think they might be pregnant. There is a potential for serious side effects to an unborn child. Talk to your health care professional or pharmacist for more information. What side effects may I notice from receiving this medicine? Side effects that  you should report to your doctor or health care professional as soon as possible: -allergic reactions like skin rash, itching or hives, swelling of the face, lips, or tongue -breathing problems -chest pain -fast, irregular heartbeat -feeling faint or lightheaded, falls -fever, chills, or any other sign of infection -muscle spasms, tightening, or twitches -numbness or tingling -skin blisters or bumps, or is dry, peels, or red -slow  healing or unexplained pain in the mouth or jaw -unusual bleeding or bruising Side effects that usually do not require medical attention (Report these to your doctor or health care professional if they continue or are bothersome.): -muscle pain -stomach upset, gas This list may not describe all possible side effects. Call your doctor for medical advice about side effects. You may report side effects to FDA at 1-800-FDA-1088. Where should I keep my medicine? This medicine is only given in a clinic, doctor's office, or other health care setting and will not be stored at home. NOTE: This sheet is a summary. It may not cover all possible information. If you have questions about this medicine, talk to your doctor, pharmacist, or health care provider.  2015, Elsevier/Gold Standard. (2012-01-25 12:37:47)

## 2015-02-05 ENCOUNTER — Ambulatory Visit (INDEPENDENT_AMBULATORY_CARE_PROVIDER_SITE_OTHER): Payer: Medicare Other | Admitting: Cardiology

## 2015-02-05 ENCOUNTER — Encounter: Payer: Self-pay | Admitting: Cardiology

## 2015-02-05 VITALS — BP 100/58 | HR 87 | Ht 60.0 in | Wt 188.0 lb

## 2015-02-05 DIAGNOSIS — I452 Bifascicular block: Secondary | ICD-10-CM | POA: Insufficient documentation

## 2015-02-05 DIAGNOSIS — R600 Localized edema: Secondary | ICD-10-CM

## 2015-02-05 DIAGNOSIS — R609 Edema, unspecified: Secondary | ICD-10-CM

## 2015-02-05 DIAGNOSIS — R079 Chest pain, unspecified: Secondary | ICD-10-CM

## 2015-02-05 DIAGNOSIS — I2510 Atherosclerotic heart disease of native coronary artery without angina pectoris: Secondary | ICD-10-CM

## 2015-02-05 DIAGNOSIS — E785 Hyperlipidemia, unspecified: Secondary | ICD-10-CM | POA: Diagnosis not present

## 2015-02-05 NOTE — Patient Instructions (Signed)
Medication Instructions:  Your physician recommends that you continue on your current medications as directed. Please refer to the Current Medication list given to you today.   Labwork: FASTING lab work to be scheduled SAME DAY AS STRESS TEST: LFTs, lipids  Testing/Procedures: Your physician has requested that you have an echocardiogram. Echocardiography is a painless test that uses sound waves to create images of your heart. It provides your doctor with information about the size and shape of your heart and how well your heart's chambers and valves are working. This procedure takes approximately one hour. There are no restrictions for this procedure.  Your physician has requested that you have a lexiscan myoview. For further information please visit [email or web address]. Please follow instruction sheet, as given.   Dr. Radford Pax recommends you have LOWER EXTREMITY VENOUS DOPPLERS.  Follow-Up: Your physician wants you to follow-up in: 1 year with Dr. Radford Pax. You will receive a reminder letter in the mail two months in advance. If you don't receive a letter, please call our office to schedule the follow-up appointment.   Any Other Special Instructions Will Be Listed Below (If Applicable).

## 2015-02-05 NOTE — Progress Notes (Signed)
Cardiology Office Note   Date:  02/05/2015   ID:  Christy Harrell, DOB 1941/04/03, MRN [id number]  PCP:  [name], MD    Chief Complaint  Patient presents with  . Follow-up    chest pain      History of Present Illness:  Christy Harrell is a 74 y.o. female with a history of HTN, DM, RBBB and LAFB, dyslipidemia and coronary artery calcifications on chest CT.   She denies any anginal chest pain.  She occasionally has some mild discomfort in the mid sternal portionf her chest.  It is worse with exertion and is associated with SOB.  She has had some problems with LE edema recently which is new.  She denies any dizziness, palptiations or syncope.    Past Medical History  Diagnosis Date  . Cancer     right breast  . Club foot   . Arthritis   . Thyroid disease   . Hyperlipidemia   . Diabetes mellitus   . Osteoporosis   . Hypertension   . GERD (gastroesophageal reflux disease)     watches diet  . Neuromuscular disorder     lt arm numb sometimes  . Wears glasses   . 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 History of radiation therapy 02/22/13- 03/13/13    left proximal humerus 3500 cGy 14 sessions  . Lymphedema     right arm  . 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 Cesarean section    . Thyroidectomy  2002  . Foot surgery      as a child for club foot  . Breast surgery  2012    rt lump-16 nodes-in [place]  . Cesarean section    . Colonoscopy    . Dilation and curettage of uterus    . Humerus im nail Left 12/27/2012    Procedure: INTRAMEDULLARY (IM) NAIL HUMERAL LEFT PATHOLOGIC FRACTURE;  Surgeon: Nita Sells, MD;  Location: [facility];  Service: Orthopedics;  Laterality: Left;     Current Outpatient Prescriptions  Medication Sig Dispense Refill  . aspirin EC 81 MG tablet Take 1 tablet (81 mg total) by mouth daily.    Marland Kitchen  atorvastatin (LIPITOR) 40 MG tablet Take 1 tablet (40 mg total) by mouth daily. 30 tablet 11  . desonide (DESOWEN) 0.05 % lotion Apply topically 2 (two) times daily. 59 mL 1  . diclofenac sodium (VOLTAREN) 1 % GEL Apply 2 g topically 4 (four) times daily. 2 Tube 2  . docusate sodium (COLACE) 100 MG capsule Take 100 mg by mouth 2 (two) times daily as needed for constipation.    Marland Kitchen everolimus (AFINITOR) 10 MG tablet Take 1 tablet (10 mg total) by mouth daily. 28 tablet 2  . exemestane (AROMASIN) 25 MG tablet Take 1 tablet (25 mg total) by mouth daily. 30 tablet 2  . ezetimibe (ZETIA) 10 MG tablet Take 1 tablet (10 mg total) by mouth daily. 90 tablet 3  . furosemide (LASIX) 40 MG tablet Take 1 tablet (40 mg total) by mouth daily. 30 tablet 6  . levothyroxine (SYNTHROID) 200 MCG tablet Take 1 tablet (200 mcg total) by mouth daily before breakfast. 90 tablet 1  . LORazepam (ATIVAN) 0.5 MG tablet Take 15 minutes before MRI test;  may repeat x 1 10 tablet 0  . lubiprostone (AMITIZA) 24 MCG capsule Take 1 capsule (24 mcg total) by mouth 2 (two) times daily with a meal. 180 capsule 1  . meloxicam (MOBIC) 7.5 MG tablet Take 1 tablet (7.5 mg total) by mouth daily. 20 tablet 0  . metFORMIN (GLUCOPHAGE) 500 MG tablet Take 1 tablet (500 mg total) by mouth daily. 90 tablet 3  . oxyCODONE (OXY IR/ROXICODONE) 5 MG immediate release tablet Take 1 tablet (5 mg total) by mouth every 4 (four) hours as needed for severe pain. Take 1-2 tablets every 4-6 hours as needed for pain - fill on or after 05/09/14 100 tablet 0  . Polyethyl Glycol-Propyl Glycol (SYSTANE ULTRA OP) Place 1 drop into both eyes daily.    . polyethylene glycol powder (GLYCOLAX/MIRALAX) powder Take 255 g (1 Container total) by mouth daily. 255 g 3  . potassium chloride SA (K-DUR,KLOR-CON) 20 MEQ tablet Take 1 tablet (20 mEq total) by mouth 2 (two) times daily. 180 tablet 1  . predniSONE (DELTASONE) 5 MG tablet Take 4 tablets with food 30 minutes before  MRI. 12 tablet 0  . pregabalin (LYRICA) 75 MG capsule Take 1 capsule (75 mg total) by mouth 2 (two) times daily. Not taking 60 capsule 5  . prochlorperazine (COMPAZINE) 5 MG tablet Take 1 tablet (5 mg total) by mouth every 6 (six) hours as needed for nausea or vomiting. 90 tablet 6   No current facility-administered medications for this visit.    Allergies:   Bydureon and Gadolinium derivatives    Social History:  The patient  reports that she quit smoking about 40 years ago. She has never used smokeless tobacco. She reports that she does not drink alcohol or use illicit drugs.   Family History:  The patient's family history includes Dementia in her mother; Hearing loss in her mother. There is no history of Cancer, Arthritis, Heart disease, Hyperlipidemia, or Hypertension.    ROS:  Please see the history of present illness.   Otherwise, review of systems are positive for constipation, back pain, fatigue.   All other systems are reviewed and negative.    PHYSICAL EXAM: VS:  BP 100/58 mmHg  Pulse 87  Ht 5' (1.524 m)  Wt 188 lb (85.276 kg)  BMI 36.72 kg/m2  SpO2 97% , BMI Body mass index is 36.72 kg/(m^2). GEN: Well nourished, well developed, in no acute distress HEENT: normal Neck: no JVD, carotid bruits, or masses Cardiac: RRR; no murmurs, rubs, or gallops,no edema  Respiratory:  clear to auscultation bilaterally, normal work of breathing GI: soft, nontender, nondistended, + BS MS: no deformity or atrophy Skin: warm and dry, no rash Neuro:  Strength and sensation are intact Psych: euthymic mood, full affect   EKG:  EKG is not ordered today.    Recent Labs: 10/19/2014: TSH 6.45* 02/04/2015: ALT 13; BUN 9.3; Creatinine 0.9; HGB 10.6*; Platelets 222; Potassium 3.6; Sodium 144    Lipid Panel    Component Value Date/Time   CHOL 217* 03/05/2014 1108   TRIG 87.0 03/05/2014 1108   HDL 66.80 03/05/2014 1108   CHOLHDL 3 03/05/2014 1108   VLDL 17.4 03/05/2014 1108   LDLCALC  133* 03/05/2014 1108   LDLDIRECT 160.0 03/16/2013 1352      Wt Readings from Last 3 Encounters:  02/05/15 188 lb (85.276 kg)  11/26/14 181 lb (82.101 kg)  11/21/14 177 lb 11.2 oz (80.604 kg)    ASSESSMENT AND PLAN:  1. RBBB with  LAFB - noted since EKG 2013.  2. Coronary artery calcifications by chest CT -  atherosclerosis of the left main and 3 vessel CAD.  She has been having some vague chest pain with typical and atypical components.  I will get a Lexiscan myoview to rule out ischemia.  She needs aggressive risk factor modification.  Continue ASA/statin. 3. Dyslipidemia - LDL is not at goal. Will continue Zetia and Lipitor 40mg  daily. Check fasting lipids and ALT  4. LE edema which is new.  Given her history of cancer I will get LE venous dopplers to rule out DVT.  I have encouraged her to let Dr. [name] know that she is having edema.  I will check a 2D echo to assess LVF.  I would like to avoid diuretics at this time due to low BP.   Current medicines are reviewed at length with the patient today.  The patient does not have concerns regarding medicines.  The following changes have been made:  no change  Labs/ tests ordered today: See above Assessment and Plan No orders of the defined types were placed in this encounter.     Disposition:   FU with me in 1 year  Signed, Sueanne Margarita, MD  02/05/2015 11:22 AM    [facility] [address], [place], [place]  [address] Phone: [phone]; Fax: [phone]

## 2015-02-12 ENCOUNTER — Encounter: Payer: Self-pay | Admitting: Internal Medicine

## 2015-02-12 ENCOUNTER — Other Ambulatory Visit (INDEPENDENT_AMBULATORY_CARE_PROVIDER_SITE_OTHER): Payer: Medicare Other

## 2015-02-12 ENCOUNTER — Ambulatory Visit (INDEPENDENT_AMBULATORY_CARE_PROVIDER_SITE_OTHER): Payer: Medicare Other | Admitting: Internal Medicine

## 2015-02-12 ENCOUNTER — Ambulatory Visit (INDEPENDENT_AMBULATORY_CARE_PROVIDER_SITE_OTHER)
Admission: RE | Admit: 2015-02-12 | Discharge: 2015-02-12 | Disposition: A | Discharge status: Home / Self Care | Payer: Medicare Other | Source: Ambulatory Visit | Attending: Internal Medicine | Admitting: Internal Medicine

## 2015-02-12 VITALS — BP 138/70 | HR 93 | Temp 97.8°F | Resp 20 | Ht 60.0 in | Wt 180.0 lb

## 2015-02-12 DIAGNOSIS — IMO0002 Reserved for concepts with insufficient information to code with codable children: Secondary | ICD-10-CM

## 2015-02-12 DIAGNOSIS — E1140 Type 2 diabetes mellitus with diabetic neuropathy, unspecified: Secondary | ICD-10-CM

## 2015-02-12 DIAGNOSIS — R0602 Shortness of breath: Secondary | ICD-10-CM

## 2015-02-12 DIAGNOSIS — E038 Other specified hypothyroidism: Secondary | ICD-10-CM

## 2015-02-12 DIAGNOSIS — E785 Hyperlipidemia, unspecified: Secondary | ICD-10-CM

## 2015-02-12 DIAGNOSIS — E1165 Type 2 diabetes mellitus with hyperglycemia: Secondary | ICD-10-CM | POA: Diagnosis not present

## 2015-02-12 DIAGNOSIS — M545 Low back pain: Secondary | ICD-10-CM

## 2015-02-12 DIAGNOSIS — J189 Pneumonia, unspecified organism: Secondary | ICD-10-CM | POA: Insufficient documentation

## 2015-02-12 LAB — LIPID PANEL
CHOLESTEROL: 301 mg/dL — AB (ref 0–200)
HDL: 63.5 mg/dL (ref >39.00)
LDL Cholesterol: 216 mg/dL — ABNORMAL HIGH (ref 0–99)
NonHDL: 237.5
Total CHOL/HDL Ratio: 5
Triglycerides: 108 mg/dL (ref 0.0–149.0)
VLDL: 21.6 mg/dL (ref 0.0–40.0)

## 2015-02-12 LAB — BASIC METABOLIC PANEL
BUN: 11 mg/dL (ref 6–23)
CALCIUM: 9.1 mg/dL (ref 8.4–10.5)
CO2: 28 mEq/L (ref 19–32)
CREATININE: 0.89 mg/dL (ref 0.40–1.20)
Chloride: 106 mEq/L (ref 96–112)
GFR: 79.84 mL/min (ref >60.00)
GLUCOSE: 118 mg/dL — AB (ref 70–99)
Potassium: 3.8 mEq/L (ref 3.5–5.1)
SODIUM: 142 meq/L (ref 135–145)

## 2015-02-12 LAB — TSH: TSH: 10.97 u[IU]/mL — AB (ref 0.35–4.50)

## 2015-02-12 LAB — HEMOGLOBIN A1C: Hgb A1c MFr Bld: 6.8 % — ABNORMAL HIGH (ref 4.6–6.5)

## 2015-02-12 LAB — BRAIN NATRIURETIC PEPTIDE: Pro B Natriuretic peptide (BNP): 39 pg/mL (ref 0.0–100.0)

## 2015-02-12 MED ORDER — LEVOTHYROXINE SODIUM 25 MCG PO TABS: 25.0000 ug | ORAL_TABLET | Freq: Every day | ORAL | Status: DC

## 2015-02-12 MED ORDER — FENTANYL 25 MCG/HR TD PT72: 25.0000 ug | MEDICATED_PATCH | TRANSDERMAL | Status: DC

## 2015-02-12 MED ORDER — LEVOTHYROXINE SODIUM 200 MCG PO TABS
200.0000 ug | ORAL_TABLET | Freq: Every day | ORAL | Status: DC
Start: 2015-02-12 — End: 2015-05-24

## 2015-02-12 MED ORDER — AMOXICILLIN-POT CLAVULANATE 875-125 MG PO TABS: 1.0000 | ORAL_TABLET | Freq: Two times a day (BID) | ORAL | Status: AC

## 2015-02-12 NOTE — Patient Instructions (Signed)
Hypothyroidism The thyroid is a large gland located in the lower front of your neck. The thyroid gland helps control metabolism. Metabolism is how your body handles food. It controls metabolism with the hormone thyroxine. When this gland is underactive (hypothyroid), it produces too little hormone.  CAUSES These include:   Absence or destruction of thyroid tissue.  Goiter due to iodine deficiency.  Goiter due to medications.  Congenital defects (since birth).  Problems with the pituitary. This causes a lack of TSH (thyroid stimulating hormone). This hormone tells the thyroid to turn out more hormone. SYMPTOMS  Lethargy (feeling as though you have no energy)  Cold intolerance  Weight gain (in spite of normal food intake)  Dry skin  Coarse hair  Menstrual irregularity (if severe, may lead to infertility)  Slowing of thought processes Cardiac problems are also caused by insufficient amounts of thyroid hormone. Hypothyroidism in the newborn is cretinism, and is an extreme form. It is important that this form be treated adequately and immediately or it will lead rapidly to retarded physical and mental development. DIAGNOSIS  To prove hypothyroidism, your caregiver may do blood tests and ultrasound tests. Sometimes the signs are hidden. It may be necessary for your caregiver to watch this illness with blood tests either before or after diagnosis and treatment. TREATMENT  Low levels of thyroid hormone are increased by using synthetic thyroid hormone. This is a safe, effective treatment. It usually takes about four weeks to gain the full effects of the medication. After you have the full effect of the medication, it will generally take another four weeks for problems to leave. Your caregiver may start you on low doses. If you have had heart problems the dose may be gradually increased. It is generally not an emergency to get rapidly to normal. HOME CARE INSTRUCTIONS   Take your  medications as your caregiver suggests. Let your caregiver know of any medications you are taking or start taking. Your caregiver will help you with dosage schedules.  As your condition improves, your dosage needs may increase. It will be necessary to have continuing blood tests as suggested by your caregiver.  Report all suspected medication side effects to your caregiver. SEEK MEDICAL CARE IF: Seek medical care if you develop:  Sweating.  Tremulousness (tremors).  Anxiety.  Rapid weight loss.  Heat intolerance.  Emotional swings.  Diarrhea.  Weakness. SEEK IMMEDIATE MEDICAL CARE IF:  You develop chest pain, an irregular heart beat (palpitations), or a rapid heart beat. MAKE SURE YOU:   Understand these instructions.  Will watch your condition.  Will get help right away if you are not doing well or get worse. Document Released: 07/27/2005 Document Revised: 10/19/2011 Document Reviewed: 03/16/2008 [employer] Patient Information 2015 [employer]. This information is not intended to replace advice given to you by your health care provider. Make sure you discuss any questions you have with your health care provider.  

## 2015-02-12 NOTE — Progress Notes (Signed)
Pre visit review using our clinic review tool, if applicable. No additional management support is needed unless otherwise documented below in the visit note. 

## 2015-02-12 NOTE — Progress Notes (Signed)
Subjective:  Patient ID: Christy Harrell, female    DOB: April 02, 1941  Age: 74 y.o. MRN: [id number]  CC: Edema   HPI Christy Harrell presents for evaluation of persistent edema over both ankles as well as a sensation of SOB isolated to the right side of her chest for the last 10 days.  Outpatient Prescriptions Prior to Visit  Medication Sig Dispense Refill  . aspirin EC 81 MG tablet Take 1 tablet (81 mg total) by mouth daily.    Marland Kitchen atorvastatin (LIPITOR) 40 MG tablet Take 1 tablet (40 mg total) by mouth daily. 30 tablet 11  . desonide (DESOWEN) 0.05 % lotion Apply topically 2 (two) times daily. 59 mL 1  . diclofenac sodium (VOLTAREN) 1 % GEL Apply 2 g topically 4 (four) times daily. 2 Tube 2  . docusate sodium (COLACE) 100 MG capsule Take 100 mg by mouth 2 (two) times daily as needed for constipation.    Marland Kitchen everolimus (AFINITOR) 10 MG tablet Take 1 tablet (10 mg total) by mouth daily. 28 tablet 2  . exemestane (AROMASIN) 25 MG tablet Take 1 tablet (25 mg total) by mouth daily. 30 tablet 2  . ezetimibe (ZETIA) 10 MG tablet Take 1 tablet (10 mg total) by mouth daily. 90 tablet 3  . furosemide (LASIX) 40 MG tablet Take 1 tablet (40 mg total) by mouth daily. 30 tablet 6  . LORazepam (ATIVAN) 0.5 MG tablet Take 15 minutes before MRI test; may repeat x 1 10 tablet 0  . lubiprostone (AMITIZA) 24 MCG capsule Take 1 capsule (24 mcg total) by mouth 2 (two) times daily with a meal. 180 capsule 1  . meloxicam (MOBIC) 7.5 MG tablet Take 1 tablet (7.5 mg total) by mouth daily. 20 tablet 0  . metFORMIN (GLUCOPHAGE) 500 MG tablet Take 1 tablet (500 mg total) by mouth daily. 90 tablet 3  . oxyCODONE (OXY IR/ROXICODONE) 5 MG immediate release tablet Take 1 tablet (5 mg total) by mouth every 4 (four) hours as needed for severe pain. Take 1-2 tablets every 4-6 hours as needed for pain - fill on or after 05/09/14 100 tablet 0  . Polyethyl Glycol-Propyl Glycol (SYSTANE ULTRA OP) Place 1 drop into both eyes  daily.    . polyethylene glycol powder (GLYCOLAX/MIRALAX) powder Take 255 g (1 Container total) by mouth daily. 255 g 3  . potassium chloride SA (K-DUR,KLOR-CON) 20 MEQ tablet Take 1 tablet (20 mEq total) by mouth 2 (two) times daily. 180 tablet 1  . predniSONE (DELTASONE) 5 MG tablet Take 4 tablets with food 30 minutes before MRI. 12 tablet 0  . pregabalin (LYRICA) 75 MG capsule Take 1 capsule (75 mg total) by mouth 2 (two) times daily. Not taking 60 capsule 5  . prochlorperazine (COMPAZINE) 5 MG tablet Take 1 tablet (5 mg total) by mouth every 6 (six) hours as needed for nausea or vomiting. 90 tablet 6  . levothyroxine (SYNTHROID) 200 MCG tablet Take 1 tablet (200 mcg total) by mouth daily before breakfast. 90 tablet 1   No facility-administered medications prior to visit.    ROS Review of Systems  Constitutional: Positive for fatigue. Negative for fever, chills, diaphoresis, activity change, appetite change and unexpected weight change.  HENT: Negative.  Negative for dental problem, facial swelling, sinus pressure, trouble swallowing and voice change.   Eyes: Negative.   Respiratory: Positive for shortness of breath. Negative for apnea, cough, choking, chest tightness, wheezing and stridor.   Cardiovascular: Positive for leg swelling. Negative  for chest pain and palpitations.  Gastrointestinal: Positive for constipation. Negative for nausea, vomiting, abdominal pain, diarrhea, blood in stool, anal bleeding and rectal pain.  Endocrine: Negative.   Genitourinary: Negative.  Negative for difficulty urinating.  Musculoskeletal: Positive for back pain and arthralgias. Negative for myalgias, joint swelling, gait problem, neck pain and neck stiffness.  Skin: Negative.   Allergic/Immunologic: Negative.   Neurological: Negative.  Negative for dizziness, tremors, weakness, light-headedness and numbness.  Hematological: Negative.  Negative for adenopathy. Does not bruise/bleed easily.    Psychiatric/Behavioral: Negative.     Objective:  BP 138/70 mmHg  Pulse 93  Temp(Src) 97.8 F (36.6 C) (Oral)  Resp 20  Ht 5' (1.524 m)  Wt 180 lb (81.647 kg)  BMI 35.15 kg/m2  SpO2 99%  BP Readings from Last 3 Encounters:  02/12/15 138/70  02/05/15 100/58  02/04/15 135/54    Wt Readings from Last 3 Encounters:  02/12/15 180 lb (81.647 kg)  02/05/15 188 lb (85.276 kg)  11/26/14 181 lb (82.101 kg)    Physical Exam  Constitutional: She appears well-developed and well-nourished.  Non-toxic appearance. She does not have a sickly appearance. She does not appear ill. No distress.  HENT:  Head: Normocephalic and atraumatic.  Mouth/Throat: Oropharynx is clear and moist. No oropharyngeal exudate.  Eyes: Conjunctivae are normal. Right eye exhibits no discharge. Left eye exhibits no discharge. No scleral icterus.  Neck: Normal range of motion. Neck supple. No JVD present. No tracheal deviation present. No thyromegaly present.  Cardiovascular: Normal rate, regular rhythm, normal heart sounds and intact distal pulses.  Exam reveals no gallop and no friction rub.   No murmur heard. Pulmonary/Chest: Effort normal and breath sounds normal. No stridor. No respiratory distress. She has no wheezes. She has no rales. She exhibits no tenderness.  Abdominal: Soft. Bowel sounds are normal. She exhibits no distension and no mass. There is no tenderness. There is no rebound and no guarding.  Musculoskeletal: Normal range of motion. She exhibits edema (there is 1+ pitting edema over both ankles). She exhibits no tenderness.  Lymphadenopathy:    She has no cervical adenopathy.  Skin: Skin is warm and dry. No rash noted. She is not diaphoretic. No erythema. No pallor.  Psychiatric: She has a normal mood and affect. Her behavior is normal. Judgment and thought content normal.  Vitals reviewed.   Lab Results  Component Value Date   WBC 5.0 02/04/2015   HGB 10.6* 02/04/2015   HCT 32.4* 02/04/2015    PLT 222 02/04/2015   GLUCOSE 118* 02/12/2015   CHOL 301* 02/12/2015   TRIG 108.0 02/12/2015   HDL 63.50 02/12/2015   LDLDIRECT 160.0 03/16/2013   LDLCALC 216* 02/12/2015   ALT 13 02/04/2015   AST 18 02/04/2015   NA 142 02/12/2015   K 3.8 02/12/2015   CL 106 02/12/2015   CREATININE 0.89 02/12/2015   BUN 11 02/12/2015   CO2 28 02/12/2015   TSH 10.97* 02/12/2015   HGBA1C 6.8* 02/12/2015   MICROALBUR 7.3* 10/19/2014    Mr Abdomen Wo Contrast  10/18/2014   CLINICAL DATA:  Metastatic breast cancer with known liver metastases. Bony metastases.  EXAM: MRI ABDOMEN WITHOUT CONTRAST  TECHNIQUE: Multiplanar multisequence MR imaging was performed without the administration of intravenous contrast.  COMPARISON:  Multiple exams, including 07/10/2014  FINDINGS: Lower chest: Stable posttherapy related findings inferiorly in the right breast with skin thickening and in inferior breast cavitary structure or which may reflect lumpectomy site. The skin thickening may  be due to radiation therapy or possibly inflammatory component of breast cancer. Small subpleural nodular density along the lingula, 1.2 by 0.8 cm on image 11 of series 1100, possibly a new nodule.  Some of the airway thickening and indistinct opacity in both lower lobes is improved compared to the prior exam.  Hepatobiliary: The hepatic metastatic lesions are measured on the axial T2 weighted fat saturated images of series 4.  The dominant lateral segment left hepatic lobe mass measures 6.2 by 4.5 cm, and by my measurements was previously 6.7 by 5.1 cm.  The dominant right hepatic lobe mass measures 3.9 by 3.7 cm on image 21 of series 4, and by my measurements was previously 4.1 by 3.7 cm. Several additional hepatic metastatic lesions are similar in size on today' s exam compared to prior exam, although seen better today due to less motion artifact. I do not observe any new lesions.  Pancreas: Unremarkable  Spleen: Unremarkable  Adrenals/Urinary  Tract: Unremarkable  Stomach/Bowel: Unremarkable  Vascular/Lymphatic: Unremarkable  Other: Unremarkable  Musculoskeletal: There is a new healing right posterior eighth rib fracture on image 11 of series 4. I do not observe specific indicators that this fractures due to malignancy.  IMPRESSION: 1. The dominant liver lesions are mildly reduced in size. 2. New healing right posterior eighth rib fracture. 3. Improved airspace opacities in both lower lobes. 4. There is a subpleural nodular density along the lingula measuring 1.2 by 0.8 cm. Although this could simply be inflammatory, I cannot exclude a true pulmonary nodule. The lungs are better assessed by chest CT. 5. Stable skin thickening in the right inferior breast, nonspecific but possibly related to radiation therapy.   Electronically Signed   By: Van Clines M.D.   On: 10/18/2014 13:40    Assessment & Plan:   [place] was seen today for edema.  Diagnoses and all orders for this visit:  Other specified hypothyroidism- her TSH is elevated so I will add an additional 25 mcg of levo-thyroxine to the 200 mcg Orders: -     TSH; Future  Type 2 diabetes, uncontrolled, with neuropathy- her blood sugars are well controlled Orders: -     Basic metabolic panel; Future -     Hemoglobin A1c; Future  Hyperlipidemia with target LDL less than 100 Orders: -     Lipid panel; Future -     Basic metabolic panel; Future  SOB (shortness of breath)- her CXR shows an area of concern, possibly PNA so will start augmentin, her d-dimer is lower than it was 10 months ago so this is not a PE, her BNP is normal so I am not concernd about CHF or fluid overload Orders: -     D-dimer, quantitative (not at [facility]); Future -     Brain natriuretic peptide; Future -     DG Chest 2 View; Future  Low back pain without sciatica, unspecified back pain laterality Orders: -     fentaNYL (DURAGESIC) 25 MCG/HR patch; Place 1 patch (25 mcg total) onto the skin every 3  (three) days.  CAP (community acquired pneumonia)- +CXR, will start augmentin Orders: -     amoxicillin-clavulanate (AUGMENTIN) 875-125 MG per tablet; Take 1 tablet by mouth 2 (two) times daily.   I have discontinued Ms. Mac's levothyroxine. I am also having her start on fentaNYL, amoxicillin-clavulanate, levothyroxine, and levothyroxine. Additionally, I am having her maintain her Polyethyl Glycol-Propyl Glycol (SYSTANE ULTRA OP), diclofenac sodium, docusate sodium, ezetimibe, aspirin EC, atorvastatin, furosemide, meloxicam, metFORMIN,  polyethylene glycol powder, prochlorperazine, oxyCODONE, lubiprostone, pregabalin, potassium chloride SA, exemestane, everolimus, LORazepam, predniSONE, and desonide.  Meds ordered this encounter  Medications  . fentaNYL (DURAGESIC) 25 MCG/HR patch    Sig: Place 1 patch (25 mcg total) onto the skin every 3 (three) days.    Dispense:  10 patch    Refill:  0  . amoxicillin-clavulanate (AUGMENTIN) 875-125 MG per tablet    Sig: Take 1 tablet by mouth 2 (two) times daily.    Dispense:  28 tablet    Refill:  0  . levothyroxine (SYNTHROID) 200 MCG tablet    Sig: Take 1 tablet (200 mcg total) by mouth daily before breakfast.    Dispense:  90 tablet    Refill:  1  . levothyroxine (LEVOTHROID) 25 MCG tablet    Sig: Take 1 tablet (25 mcg total) by mouth daily before breakfast.    Dispense:  90 tablet    Refill:  1     Follow-up: Return in about 4 months (around 06/15/2015).  [name], MD

## 2015-02-13 ENCOUNTER — Encounter: Payer: Self-pay | Admitting: Internal Medicine

## 2015-02-13 ENCOUNTER — Telehealth: Payer: Self-pay | Admitting: *Deleted

## 2015-02-13 LAB — D-DIMER, QUANTITATIVE (NOT AT ARMC): D DIMER QUANT: 1.47 ug{FEU}/mL — AB (ref 0.00–0.48)

## 2015-02-13 NOTE — Telephone Encounter (Signed)
This RN spoke with pt per receiving communication that she contacted on call nurse pm 7/5.  Noted concern relating to having a CXR and needing an antibiotic.  Per discussion with pt as well as dictation in EPIC from primary MD from 7/5- pt had CXR due to SOB, and chest pain ( non cardiac )  Cally states Dr Ronnald Ramp called her this AM with results from yesterday- with overall good reading except for CXR with probable pneumonia.  Dr Ronnald Ramp has prescribed an antibiotic and requested pt to follow up as scheduled with scans and appointment with Dr Jannifer Rodney.  Pt is is scheduled for MRI of chest mid July with appt with Dr Jannifer Rodney on 7/25.  This RN validated above plan as well as encouraged pt per her positive thinking relating " I am just praying this is not cancer "  Per end of conversation pt verbalized understanding to call if symptoms or new symptoms occur for appropriate follow up.

## 2015-02-14 ENCOUNTER — Ambulatory Visit (HOSPITAL_COMMUNITY): Payer: Medicare Other | Attending: Cardiovascular Disease

## 2015-02-14 ENCOUNTER — Other Ambulatory Visit: Payer: Self-pay

## 2015-02-14 DIAGNOSIS — R079 Chest pain, unspecified: Secondary | ICD-10-CM | POA: Diagnosis not present

## 2015-02-14 DIAGNOSIS — R609 Edema, unspecified: Secondary | ICD-10-CM | POA: Insufficient documentation

## 2015-02-14 DIAGNOSIS — R600 Localized edema: Secondary | ICD-10-CM

## 2015-02-18 ENCOUNTER — Telehealth (HOSPITAL_COMMUNITY): Payer: Self-pay

## 2015-02-18 NOTE — Telephone Encounter (Signed)
Patient given detailed instructions per Myocardial Perfusion Study Information Sheet for test on 02-20-2015 at 0900, arrive at 0745 for PV study. Patient Notified to arrive 15 minutes early, and that it is imperative to arrive on time for appointment to keep from having the test rescheduled. Patient verbalized understanding. Oletta Lamas, Mauro Arps A

## 2015-02-20 ENCOUNTER — Telehealth: Payer: Self-pay

## 2015-02-20 ENCOUNTER — Ambulatory Visit (HOSPITAL_BASED_OUTPATIENT_CLINIC_OR_DEPARTMENT_OTHER): Payer: Medicare Other

## 2015-02-20 ENCOUNTER — Other Ambulatory Visit (INDEPENDENT_AMBULATORY_CARE_PROVIDER_SITE_OTHER): Payer: Medicare Other | Admitting: *Deleted

## 2015-02-20 ENCOUNTER — Ambulatory Visit (HOSPITAL_COMMUNITY): Payer: Medicare Other | Attending: Cardiology

## 2015-02-20 DIAGNOSIS — R600 Localized edema: Secondary | ICD-10-CM | POA: Diagnosis not present

## 2015-02-20 DIAGNOSIS — R609 Edema, unspecified: Secondary | ICD-10-CM | POA: Insufficient documentation

## 2015-02-20 DIAGNOSIS — R079 Chest pain, unspecified: Secondary | ICD-10-CM | POA: Diagnosis not present

## 2015-02-20 DIAGNOSIS — E785 Hyperlipidemia, unspecified: Secondary | ICD-10-CM

## 2015-02-20 DIAGNOSIS — R9439 Abnormal result of other cardiovascular function study: Secondary | ICD-10-CM | POA: Diagnosis not present

## 2015-02-20 LAB — MYOCARDIAL PERFUSION IMAGING
CHL CUP RESTING HR STRESS: 75 {beats}/min
LHR: 0.31
LV dias vol: 66 mL
LVSYSVOL: 25 mL
NUC STRESS TID: 0.91
Peak HR: 96 {beats}/min
SDS: 2
SRS: 1
SSS: 3

## 2015-02-20 LAB — HEPATIC FUNCTION PANEL
ALT: 10 U/L (ref 0–35)
AST: 18 U/L (ref 0–37)
Albumin: 3.5 g/dL (ref 3.5–5.2)
Alkaline Phosphatase: 64 U/L (ref 39–117)
BILIRUBIN DIRECT: 0.1 mg/dL (ref 0.0–0.3)
BILIRUBIN TOTAL: 0.4 mg/dL (ref 0.2–1.2)
TOTAL PROTEIN: 6.9 g/dL (ref 6.0–8.3)

## 2015-02-20 LAB — LIPID PANEL
Cholesterol: 272 mg/dL — ABNORMAL HIGH (ref 0–200)
HDL: 58.7 mg/dL (ref >39.00)
LDL CALC: 194 mg/dL — AB (ref 0–99)
NONHDL: 213.3
Total CHOL/HDL Ratio: 5
Triglycerides: 95 mg/dL (ref 0.0–149.0)
VLDL: 19 mg/dL (ref 0.0–40.0)

## 2015-02-20 MED ORDER — ATORVASTATIN CALCIUM 80 MG PO TABS: 80.0000 mg | ORAL_TABLET | Freq: Every day | ORAL | Status: AC

## 2015-02-20 MED ORDER — TECHNETIUM TC 99M SESTAMIBI GENERIC - CARDIOLITE
32.6000 | Freq: Once | INTRAVENOUS | Status: AC | PRN
  Administered 2015-02-20: 33 via INTRAVENOUS

## 2015-02-20 MED ORDER — REGADENOSON 0.4 MG/5ML IV SOLN
0.4000 mg | Freq: Once | INTRAVENOUS | Status: AC
  Administered 2015-02-20: 0.4 mg via INTRAVENOUS

## 2015-02-20 MED ORDER — TECHNETIUM TC 99M SESTAMIBI GENERIC - CARDIOLITE
10.2000 | Freq: Once | INTRAVENOUS | Status: AC | PRN
  Administered 2015-02-20: 10 via INTRAVENOUS

## 2015-02-20 NOTE — Telephone Encounter (Signed)
Informed patient of results and verbal understanding expressed.   Instructed patient to INCREASE LIPITOR to 80 mg daily. FLP and ALT scheduled in September. Patient agrees with treatment plan.

## 2015-02-20 NOTE — Telephone Encounter (Signed)
-----   Message from Sueanne Margarita, MD sent at 02/20/2015  1:27 PM EDT ----- Increase Lipitor to 80mg  daily and recheck FLP and ALT in 6 weeks

## 2015-02-21 ENCOUNTER — Other Ambulatory Visit: Payer: Self-pay | Admitting: Oncology

## 2015-02-22 ENCOUNTER — Other Ambulatory Visit: Payer: Medicare Other

## 2015-02-22 ENCOUNTER — Ambulatory Visit: Payer: Medicare Other

## 2015-02-25 ENCOUNTER — Ambulatory Visit (HOSPITAL_COMMUNITY)
Admission: RE | Admit: 2015-02-25 | Discharge: 2015-02-25 | Disposition: A | Discharge status: Home / Self Care | Payer: Medicare Other | Source: Ambulatory Visit | Attending: Nurse Practitioner | Admitting: Nurse Practitioner

## 2015-02-25 ENCOUNTER — Telehealth: Payer: Self-pay

## 2015-02-25 ENCOUNTER — Ambulatory Visit (HOSPITAL_COMMUNITY): Payer: Medicare Other

## 2015-02-25 DIAGNOSIS — C787 Secondary malignant neoplasm of liver and intrahepatic bile duct: Secondary | ICD-10-CM | POA: Insufficient documentation

## 2015-02-25 DIAGNOSIS — R9439 Abnormal result of other cardiovascular function study: Secondary | ICD-10-CM

## 2015-02-25 DIAGNOSIS — I2510 Atherosclerotic heart disease of native coronary artery without angina pectoris: Secondary | ICD-10-CM

## 2015-02-25 DIAGNOSIS — Z853 Personal history of malignant neoplasm of breast: Secondary | ICD-10-CM | POA: Insufficient documentation

## 2015-02-25 DIAGNOSIS — R079 Chest pain, unspecified: Secondary | ICD-10-CM

## 2015-02-25 NOTE — Telephone Encounter (Signed)
Informed patient of results and verbal understanding expressed.  CTA ordered for scheduling. Patient agrees with treatment plan.

## 2015-02-25 NOTE — Telephone Encounter (Signed)
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

## 2015-02-26 ENCOUNTER — Other Ambulatory Visit: Payer: Self-pay

## 2015-02-26 DIAGNOSIS — R079 Chest pain, unspecified: Secondary | ICD-10-CM

## 2015-02-27 ENCOUNTER — Other Ambulatory Visit: Payer: Self-pay

## 2015-03-04 ENCOUNTER — Ambulatory Visit (HOSPITAL_BASED_OUTPATIENT_CLINIC_OR_DEPARTMENT_OTHER): Payer: Medicare Other

## 2015-03-04 ENCOUNTER — Other Ambulatory Visit (HOSPITAL_BASED_OUTPATIENT_CLINIC_OR_DEPARTMENT_OTHER): Payer: Medicare Other

## 2015-03-04 ENCOUNTER — Ambulatory Visit (HOSPITAL_BASED_OUTPATIENT_CLINIC_OR_DEPARTMENT_OTHER): Payer: Medicare Other | Admitting: Oncology

## 2015-03-04 ENCOUNTER — Telehealth: Payer: Self-pay | Admitting: Oncology

## 2015-03-04 VITALS — BP 142/63 | HR 90 | Temp 98.4°F | Resp 19 | Ht 60.0 in | Wt 184.9 lb

## 2015-03-04 DIAGNOSIS — C50512 Malignant neoplasm of lower-outer quadrant of left female breast: Secondary | ICD-10-CM | POA: Diagnosis not present

## 2015-03-04 DIAGNOSIS — C7951 Secondary malignant neoplasm of bone: Secondary | ICD-10-CM

## 2015-03-04 DIAGNOSIS — C787 Secondary malignant neoplasm of liver and intrahepatic bile duct: Secondary | ICD-10-CM

## 2015-03-04 DIAGNOSIS — R600 Localized edema: Secondary | ICD-10-CM

## 2015-03-04 DIAGNOSIS — Z79811 Long term (current) use of aromatase inhibitors: Secondary | ICD-10-CM

## 2015-03-04 DIAGNOSIS — Z5111 Encounter for antineoplastic chemotherapy: Secondary | ICD-10-CM | POA: Diagnosis not present

## 2015-03-04 DIAGNOSIS — M810 Age-related osteoporosis without current pathological fracture: Secondary | ICD-10-CM

## 2015-03-04 LAB — COMPREHENSIVE METABOLIC PANEL (CC13)
ALBUMIN: 2.7 g/dL — AB (ref 3.5–5.0)
ALK PHOS: 69 U/L (ref 40–150)
ALT: 9 U/L (ref 0–55)
ANION GAP: 8 meq/L (ref 3–11)
AST: 19 U/L (ref 5–34)
BUN: 10.9 mg/dL (ref 7.0–26.0)
CALCIUM: 8.9 mg/dL (ref 8.4–10.4)
CO2: 25 meq/L (ref 22–29)
CREATININE: 0.8 mg/dL (ref 0.6–1.1)
Chloride: 111 mEq/L — ABNORMAL HIGH (ref 98–109)
EGFR: >90 mL/min/{1.73_m2} (ref >90)
Glucose: 251 mg/dl — ABNORMAL HIGH (ref 70–140)
POTASSIUM: 3.9 meq/L (ref 3.5–5.1)
Sodium: 143 mEq/L (ref 136–145)
TOTAL PROTEIN: 6.4 g/dL (ref 6.4–8.3)
Total Bilirubin: 0.33 mg/dL (ref 0.20–1.20)

## 2015-03-04 LAB — CBC WITH DIFFERENTIAL/PLATELET
BASO%: 1.5 % (ref 0.0–2.0)
BASOS ABS: 0.1 10*3/uL (ref 0.0–0.1)
EOS ABS: 0.2 10*3/uL (ref 0.0–0.5)
EOS%: 3.6 % (ref 0.0–7.0)
HEMATOCRIT: 30.5 % — AB (ref 34.8–46.6)
HEMOGLOBIN: 10 g/dL — AB (ref 11.6–15.9)
LYMPH%: 22.2 % (ref 14.0–49.7)
MCH: 25.4 pg (ref 25.1–34.0)
MCHC: 32.7 g/dL (ref 31.5–36.0)
MCV: 77.6 fL — ABNORMAL LOW (ref 79.5–101.0)
MONO#: 0.5 10*3/uL (ref 0.1–0.9)
MONO%: 10.4 % (ref 0.0–14.0)
NEUT%: 62.3 % (ref 38.4–76.8)
NEUTROS ABS: 3.1 10*3/uL (ref 1.5–6.5)
Platelets: 273 10*3/uL (ref 145–400)
RBC: 3.92 10*6/uL (ref 3.70–5.45)
RDW: 15.5 % — ABNORMAL HIGH (ref 11.2–14.5)
WBC: 5 10*3/uL (ref 3.9–10.3)
lymph#: 1.1 10*3/uL (ref 0.9–3.3)

## 2015-03-04 MED ORDER — EXEMESTANE 25 MG PO TABS: 25.0000 mg | ORAL_TABLET | Freq: Every day | ORAL | Status: DC

## 2015-03-04 MED ORDER — FULVESTRANT 250 MG/5ML IM SOLN
500.0000 mg | Freq: Once | INTRAMUSCULAR | Status: AC
  Administered 2015-03-04: 500 mg via INTRAMUSCULAR
  Filled 2015-03-04: qty 10

## 2015-03-04 MED ORDER — EVEROLIMUS 5 MG PO TABS: 5.0000 mg | ORAL_TABLET | Freq: Every day | ORAL | Status: DC

## 2015-03-04 NOTE — Addendum Note (Signed)
Addended by: Chauncey Cruel on: 03/04/2015 01:42 PM   Modules accepted: Orders

## 2015-03-04 NOTE — Telephone Encounter (Signed)
Appointments made and avs printed for patient,gyn information given to patient to call for an appointment

## 2015-03-04 NOTE — Progress Notes (Signed)
ID: Christy Harrell   DOB: [birth year]  MR#: [id number]  WUX#:[id number]  PCP: [name], MD GYN:  SU:  OTHER MD:  Alysia Penna, Frederik Pear, Faustino Congress  CC: Breast cancer stage IV  TREATMENT: exemestane, everolimus, fulvestrant, and denosumab  BREAST CANCER HISTORY: From the original intake note:  The patient had routine screening mammography 10/16/2010 showing a lobulated mass in the right subareolar region measuring up to 5.5 cm. The Left breast showed some central microcalcifications. Biopsy of the Right breast mass on 10/28/2010 showed an invasive ductal carcinoma, grade 2, estrogen receptor positive (Allred score 8), progesterone receptor positive (Allred score 5), with an equivocal HER-2. The Left breast area of microcalcifications was biopsied at the same time, and was read as suspicious for DCIS.  On 11/17/2010 the patient underwent Right lumpectomy and axillary lymph node dissection for what proved to be a 3 cm invasive ductal carcinoma, grade 2, with some papillary and mucinous features. One of 16 lymph nodes was involved. FISH showed no HER-2 amplification. Left breast biopsy was benign.  The patient had an Oncotype sent, with a score of 27, predicting a risk of distant recurrence after 5 years of tamoxifen in the 18% range. With this intermediate result, the decision was made not to proceed with chemotherapy, since the patient is the sole caregiver to her very elderly mother. Instead the patient proceeded to radiation treatment which was completed 03/06/2011. She started anastrozole at that point. Her subsequent history is as detailed below   INTERVAL HISTORY: Christy Harrell returns today for follow up of her metastatic breast cancer. She has developed lower extremity swelling bilaterally, which has been evaluated by Dr. Ronnald Ramp with bilateral Dopplers, which showed no evidence of DVT. She had a nuclear cardiac test which showed an ejection  fraction of 62%. She has also been complaining of shortness of breath, and a chest x-ray obtained earlier this month showed a retrocardiac area suspicious for pneumonia. She has been taking Augmentin she tells me for 3 weeks for this, without obvious improvement.  Aside from those problems, she is tolerating the exemestane and everolimus well. She does not mind the fulvestrant and denosumab injections.   REVIEW OF SYSTEMS: Christy Harrell complains of fatigue, arthritis, and hot flashes. Of course she has chronic problems with her sugars. A detailed review of systems today was otherwise stable  PAST MEDICAL HISTORY: Past Medical History  Diagnosis Date  . Cancer     right breast  . Club foot   . Arthritis   . Thyroid disease   . Hyperlipidemia   . Diabetes mellitus   . Osteoporosis   . Hypertension   . GERD (gastroesophageal reflux disease)     watches diet  . Neuromuscular disorder     lt arm numb sometimes  . Wears glasses   . 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 History of radiation therapy 02/22/13- 03/13/13    left proximal humerus 3500 cGy 14 sessions  . Lymphedema     right arm  . Metastasis from malignant tumor of breast     liver    PAST SURGICAL HISTORY: Past Surgical History  Procedure Laterality Date  . Cesarean section    . Thyroidectomy  2002  . Foot surgery      as a child for club foot  . Breast surgery  2012    rt lump-16 nodes-in [place]  . Cesarean section    . Colonoscopy    .  Dilation and curettage of uterus    . Humerus im nail Left 12/27/2012    Procedure: INTRAMEDULLARY (IM) NAIL HUMERAL LEFT PATHOLOGIC FRACTURE;  Surgeon: Nita Sells, MD;  Location: [facility];  Service: Orthopedics;  Laterality: Left;    FAMILY HISTORY Family History  Problem Relation Age of Onset  . Cancer Neg Hx   . Arthritis Neg Hx   . Heart disease Neg Hx   . Hyperlipidemia Neg Hx   . Hypertension Neg Hx   . Dementia  Mother   . Hearing loss Mother    The patient's father died from unknown causes. The patient's mother is alive at age 13. The patient was a single child. There is no history of breast or ovarian cancer in the family to her knowledge.  GYNECOLOGIC HISTORY: Menarche age 82, menopause in her early 94s. The patient is GX P1, first pregnancy to term age 33. She did not take hormone replacement  SOCIAL HISTORY: The patient grew up in [place], attended [employer] high school, then moved to [place] where she lived about 40 years. 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 Her mother, Christy Harrell away at 100, on 08-29-2014. The patient's daughter Christy Harrell, 96, is disabled secondary to pulmonary hypertension. She can be reached at [phone]. The patient has 2 grandchildren, [name] who works at a Scientist, physiological business and is 74 years old, in general, 25, completing high school.   ADVANCED DIRECTIVES:not in place  HEALTH MAINTENANCE: History  Substance Use Topics  . Smoking status: Former Smoker    Quit date: 12/23/1974  . Smokeless tobacco: Never Used  . Alcohol Use: No     Colonoscopy:  PAP:    Bone density:  2012, on ibandronate chronically  Lipid panel: UTD  Allergies  Allergen Reactions  . Bydureon [Exenatide] Rash  . Gadolinium Derivatives Other (See Comments)    Pt began having chest numbness/tingling , stated her heart felt funny, had to take her to ED for EKG per RN   CAP    Current Outpatient Prescriptions  Medication Sig Dispense Refill  . AFINITOR 10 MG tablet TAKE 1 TABLET BY MOUTH ONCE DAILY 28 tablet 2  . aspirin EC 81 MG tablet Take 1 tablet (81 mg total) by mouth daily.    Marland Kitchen atorvastatin (LIPITOR) 80 MG tablet Take 1 tablet (80 mg total) by mouth daily. 30 tablet 11  . desonide (DESOWEN) 0.05 % lotion Apply topically 2 (two) times daily. 59 mL 1  . diclofenac sodium (VOLTAREN) 1 % GEL Apply 2 g topically 4 (four) times daily. 2  Tube 2  . docusate sodium (COLACE) 100 MG capsule Take 100 mg by mouth 2 (two) times daily as needed for constipation.    Marland Kitchen exemestane (AROMASIN) 25 MG tablet TAKE 1 TABLET BY MOUTH ONCE DAILY 30 tablet 2  . ezetimibe (ZETIA) 10 MG tablet Take 1 tablet (10 mg total) by mouth daily. 90 tablet 3  . fentaNYL (DURAGESIC) 25 MCG/HR patch Place 1 patch (25 mcg total) onto the skin every 3 (three) days. 10 patch 0  . furosemide (LASIX) 40 MG tablet Take 1 tablet (40 mg total) by mouth daily. 30 tablet 6  . levothyroxine (LEVOTHROID) 25 MCG tablet Take 1 tablet (25 mcg total) by mouth daily before breakfast. 90 tablet 1  . levothyroxine (SYNTHROID) 200 MCG tablet Take 1 tablet (200 mcg total) by mouth daily before breakfast. 90 tablet 1  . LORazepam (  ATIVAN) 0.5 MG tablet Take 15 minutes before MRI test; may repeat x 1 10 tablet 0  . lubiprostone (AMITIZA) 24 MCG capsule Take 1 capsule (24 mcg total) by mouth 2 (two) times daily with a meal. 180 capsule 1  . meloxicam (MOBIC) 7.5 MG tablet Take 1 tablet (7.5 mg total) by mouth daily. 20 tablet 0  . metFORMIN (GLUCOPHAGE) 500 MG tablet Take 1 tablet (500 mg total) by mouth daily. 90 tablet 3  . oxyCODONE (OXY IR/ROXICODONE) 5 MG immediate release tablet Take 1 tablet (5 mg total) by mouth every 4 (four) hours as needed for severe pain. Take 1-2 tablets every 4-6 hours as needed for pain - fill on or after 05/09/14 100 tablet 0  . Polyethyl Glycol-Propyl Glycol (SYSTANE ULTRA OP) Place 1 drop into both eyes daily.    . polyethylene glycol powder (GLYCOLAX/MIRALAX) powder Take 255 g (1 Container total) by mouth daily. 255 g 3  . potassium chloride SA (K-DUR,KLOR-CON) 20 MEQ tablet Take 1 tablet (20 mEq total) by mouth 2 (two) times daily. 180 tablet 1  . predniSONE (DELTASONE) 5 MG tablet Take 4 tablets with food 30 minutes before MRI. 12 tablet 0  . pregabalin (LYRICA) 75 MG capsule Take 1 capsule (75 mg total) by mouth 2 (two) times daily. Not taking 60  capsule 5  . prochlorperazine (COMPAZINE) 5 MG tablet Take 1 tablet (5 mg total) by mouth every 6 (six) hours as needed for nausea or vomiting. 90 tablet 6   No current facility-administered medications for this visit.   PHYSICAL EXAM: Elderly African American woman who appears stated age 19 Vitals:   03/04/15 1106  BP: 142/63  Pulse: 90  Temp: 98.4 F (36.9 C)  Resp: 19   Body mass index is 36.11 kg/(m^2).    ECOG FS: 2 Filed Weights   03/04/15 1106  Weight: 184 lb 14.4 oz (83.87 kg)   Sclerae unicteric, EOMs intact Oropharynx clear and moist No cervical or supraclavicular adenopathy Lungs no rales or rhonchi Heart regular rate and rhythm Abd soft, obese, nontender, positive bowel sounds MSK no focal spinal tenderness, chronic grade 3 right upper extremity lymphedema, bilateral grade 2 lower extremity edema Neuro: nonfocal, well oriented, appropriate affect Breasts: The right breast is status post lumpectomy and radiation. There are chronic skin changes, but no change from baseline and no evidence of local recurrence. The right axilla is benign per the left breast is unremarkable.  LAB RESULTS: Lab Results  Component Value Date   WBC 5.0 03/04/2015   NEUTROABS 3.1 03/04/2015   HGB 10.0* 03/04/2015   HCT 30.5* 03/04/2015   MCV 77.6* 03/04/2015   PLT 273 03/04/2015      Chemistry      Component Value Date/Time   NA 143 03/04/2015 1041   NA 142 02/12/2015 1520   K 3.9 03/04/2015 1041   K 3.8 02/12/2015 1520   CL 106 02/12/2015 1520   CL 105 01/23/2013 1337   CO2 25 03/04/2015 1041   CO2 28 02/12/2015 1520   BUN 10.9 03/04/2015 1041   BUN 11 02/12/2015 1520   CREATININE 0.8 03/04/2015 1041   CREATININE 0.89 02/12/2015 1520      Component Value Date/Time   CALCIUM 8.9 03/04/2015 1041   CALCIUM 9.1 02/12/2015 1520   ALKPHOS 69 03/04/2015 1041   ALKPHOS 64 02/20/2015 0759   AST 19 03/04/2015 1041   AST 18 02/20/2015 0759   ALT 9 03/04/2015 1041   ALT 10  02/20/2015 0759   BILITOT 0.33 03/04/2015 1041   BILITOT 0.4 02/20/2015 0759       Lab Results  Component Value Date   LABCA2 24 11/19/2011    STUDIES:  Nuclear stress EF: 62%.  There was no ST segment deviation noted during stress.  Findings consistent with prior myocardial infarction.  This is a low risk study. Abnormal.  Mild inferior hypokinesis.  There is a small defect of severe severity present in the apical anterior and apical septal location. The defect is non-reversible. 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    Dg Chest 2 View  02/12/2015   CLINICAL DATA:  Two weeks of shortness of breath, history of diabetes, previous tobacco use, and right breast malignancy with right axillary lymph node dissection.  EXAM: CHEST  2 VIEW  COMPARISON:  PA and lateral chest x-ray of September 04, 2014  FINDINGS: The lungs are adequately inflated. There is increased density in the right lower lobe posteriorly. There is no pleural effusion. The cardiac silhouette is normal. The pulmonary vascularity is not engorged. The mediastinum is normal in width. There is mild tortuosity of the descending thoracic aorta. There are prominent lateral osteophytes at multiple thoracic levels. There are surgical clips in the right axillary region. The patient has undergone previous ORIF for a left humeral fracture.  IMPRESSION: Increased density in the retrocardiac region most likely on the right is worrisome for pneumonia.  Followup PA and lateral chest X-ray is recommended in 3-4 weeks following trial of antibiotic therapy to ensure resolution and exclude underlying malignancy.   Electronically Signed   By: David  Martinique M.D.   On: 02/12/2015 15:57   Mr Abdomen Wo Contrast  02/25/2015   CLINICAL DATA:  74 year old female with history breast cancer with known liver metastasis. Allergy to gadolinium.  EXAM: MRI ABDOMEN WITHOUT CONTRAST  TECHNIQUE: Multiplanar multisequence MR imaging was performed without the  administration of intravenous contrast.  COMPARISON:  MRI of the abdomen without contrast 10/18/2014.  FINDINGS: Comment: Study is limited for assessment of visceral and/or vascular lesions by lack of IV gadolinium.  Lower chest: Small focus of abnormal signal intensity in the inferior segment of the lingula (nonspecific), but similar to prior examination.  Hepatobiliary: As done on the prior study, hepatic lesions were all measured off of axial T2 respiratory triggered fat saturated images (series 3). The lesions appear slightly smaller than the prior study, with specific examples including a 6.1 x 4.3 cm lesion in segment 2 (image 12 of series 3), previously 4.5 x 6.2 cm, and a 3.8 x 3.4 cm lesion between segments 5 and 6 (image 18 of series 3), previously 3.9 x 3.7 cm. Several smaller hepatic lesions are also noted, also slightly less apparent compared to the prior examination. No new hepatic lesions are noted. No intra or extrahepatic biliary ductal dilatation. Gallbladder is normal in appearance.  Pancreas: No pancreatic mass. No pancreatic ductal dilatation. No pancreatic or peripancreatic fluid or inflammatory changes.  Spleen: Unremarkable.  Adrenals/Urinary Tract: Sub cm T1 hypointense, T2 hyperintense lesion in the medial aspect of the interpolar region of the left kidney is unchanged compared to prior examinations, likely a small cyst. Right kidney and bilateral adrenal glands are normal in appearance. No hydroureteronephrosis in the visualized abdomen.  Stomach/Bowel: Visualized portions are unremarkable.  Vascular/Lymphatic: No definite aneurysm in the visualized abdominal vasculature. No lymphadenopathy noted in the abdomen.  Other: No significant volume of ascites in the visualized peritoneal cavity.  Musculoskeletal: Postoperative changes in  the right breast again noted, with what appears to be a chronic postoperative fluid collection which appears slightly smaller than the prior examination,  presumably a seroma (this is poorly evaluated on today's noncontrast abdominal MRI). Healing fracture of the posterior aspect of the right eighth rib again noted.  IMPRESSION: 1. Slight interval regression of several hepatic metastases, as above. No new hepatic lesions are noted. 2. Additional findings, as above, similar to the prior examination.   Electronically Signed   By: Vinnie Langton M.D.   On: 02/25/2015 14:15    ASSESSMENT: 74 y.o.  [place] woman with stage IV [bone only] breast cancer in a  (1)  status post right lumpectomy and axillary lymph node dissection 11/17/2010 for a pT2 pN1, stage IIB invasive ductal carcinoma, grade 2, estrogen and progesterone receptor positive, HER-2 negative,   (2) Oncotype DX recurrence score of 27 predicting a risk of distant recurrence of 18% with 5 years of tamoxifen (intermediate score);   (3)  status post radiation completed July of 2012,   (4) started anastrozole July 2012, discontinued June 2014 with evidence of metastatic spread to bone  (5) Chronic lymphedema in the right upper extremity.  (6) pathologic fracture of the left humerus along apparent lytic lesion, with no other bone lesions per bone scan 12/12/2012  (7) on 12/27/2012 underwent #1 intramedullary nail left pathologic proximal humerus fracture  #2 left shoulder rotator cuff repair  #3 left shoulder open bone biopsy with pathology showing metastatic breast adenocarcinoma, estrogen receptor 100% positive, progesterone receptor and HER-2 negative, with an MIB-1 of 26%. PET scan 01/13/2013 showed no other areas of disease and  (8) status post 35 Gy to the left proximal humerus completed 03/13/2013(  (9) fulvestrant, first injections on 01/23/2013  (10) zolendronic acid given monthly, first dose 01/23/2013; changed to denosumab as of August 2015 because of access problems  (11) multiple liver lesions noted on scans April 2015   (12) letrozole added May 2015, stopped 04/18/14  because of progression in her liver noted on a chest CT performed on 03/14/14  (13) everolimus and exemestane started 04/19/14  (a) MRI of the abdomen 02/25/2015 shows continuing response   PLAN:  I am delighted that Katalena's cancer continues to respond to her current therapy, which she is tolerating well. She has a variety of other problems, which she is working on through her primary care doctor and cardiologist.  I don't have a simple explanation for her lower extremity edema. There are no clots there and she has a good ejection fraction. I recommended compression stockings but she tells me these are very hard to put on. That is commonly what my patients tell me. She can consider wrapping with Ace bandages while Dr. Ronnald Ramp continues to work on the issue.  She has not had a gynecologic exam "in a long time" and has some discomfort which she would like evaluated. I am referring her to Dr. Alden Hipp for evaluation and treatment  I'm not sure why she is more short of breath. Certainly everolimus can cause an pneumonitis, but the symptoms she has she tells me localized to the right lung. Her chest x-ray from earlier this month suggests early pneumonia. She has been on antibiotics for that without significant improvement per her report. I am going to go ahead and obtain a CT of the chest to see if he can clarify what is currently going on.  Otherwise she will see [place] again in 2 months. She knows to call  for any other problems that may develop before then. Chauncey Cruel, MD  03/04/2015

## 2015-03-07 ENCOUNTER — Ambulatory Visit (HOSPITAL_COMMUNITY)
Admission: RE | Admit: 2015-03-07 | Discharge: 2015-03-07 | Disposition: A | Discharge status: Home / Self Care | Payer: Medicare Other | Source: Ambulatory Visit | Attending: Oncology | Admitting: Oncology

## 2015-03-07 ENCOUNTER — Encounter (HOSPITAL_COMMUNITY): Payer: Self-pay

## 2015-03-07 DIAGNOSIS — E890 Postprocedural hypothyroidism: Secondary | ICD-10-CM | POA: Insufficient documentation

## 2015-03-07 DIAGNOSIS — J479 Bronchiectasis, uncomplicated: Secondary | ICD-10-CM | POA: Insufficient documentation

## 2015-03-07 DIAGNOSIS — Z79899 Other long term (current) drug therapy: Secondary | ICD-10-CM | POA: Insufficient documentation

## 2015-03-07 DIAGNOSIS — C50512 Malignant neoplasm of lower-outer quadrant of left female breast: Secondary | ICD-10-CM

## 2015-03-07 DIAGNOSIS — C787 Secondary malignant neoplasm of liver and intrahepatic bile duct: Secondary | ICD-10-CM | POA: Insufficient documentation

## 2015-03-07 DIAGNOSIS — C7951 Secondary malignant neoplasm of bone: Secondary | ICD-10-CM | POA: Diagnosis not present

## 2015-03-07 DIAGNOSIS — C50911 Malignant neoplasm of unspecified site of right female breast: Secondary | ICD-10-CM | POA: Insufficient documentation

## 2015-03-07 DIAGNOSIS — Z923 Personal history of irradiation: Secondary | ICD-10-CM | POA: Insufficient documentation

## 2015-03-07 DIAGNOSIS — R0602 Shortness of breath: Secondary | ICD-10-CM | POA: Insufficient documentation

## 2015-03-07 DIAGNOSIS — I2510 Atherosclerotic heart disease of native coronary artery without angina pectoris: Secondary | ICD-10-CM | POA: Diagnosis not present

## 2015-03-07 DIAGNOSIS — I700 Atherosclerosis of aorta: Secondary | ICD-10-CM | POA: Diagnosis not present

## 2015-03-07 DIAGNOSIS — M810 Age-related osteoporosis without current pathological fracture: Secondary | ICD-10-CM

## 2015-03-07 MED ORDER — IOHEXOL 300 MG/ML  SOLN
100.0000 mL | Freq: Once | INTRAMUSCULAR | Status: AC | PRN
  Administered 2015-03-07: 80 mL via INTRAVENOUS

## 2015-03-12 ENCOUNTER — Telehealth: Payer: Self-pay | Admitting: Cardiology

## 2015-03-12 NOTE — Telephone Encounter (Signed)
Spoke with [facility] to make an appointment for her Cardiac Ct.   She want to see if she can wait a while before doing this test.  She just had a chest ct done on 03-07-15 for her cancer MD.   She stated she only has one arm that is use to do IV in due to she has problems with her right arm.  My veins are tried from the last test.   I will defer the test x 2 months.

## 2015-03-12 NOTE — Telephone Encounter (Signed)
I would prefer to proceed with Coronary CT given her symptoms of vague CP and abnormal stress test

## 2015-03-13 NOTE — Telephone Encounter (Signed)
Left message to call back with Dr Theodosia Blender recommendations for the pt to proceed with getting a coronary ct done.

## 2015-03-14 NOTE — Telephone Encounter (Signed)
Patient agrees to proceed with coronary CT now.  To [facility] for scheduling.

## 2015-03-18 ENCOUNTER — Other Ambulatory Visit: Payer: Self-pay | Admitting: Obstetrics & Gynecology

## 2015-03-20 LAB — CYTOLOGY - PAP

## 2015-03-22 ENCOUNTER — Ambulatory Visit: Payer: Medicare Other

## 2015-03-22 ENCOUNTER — Other Ambulatory Visit: Payer: Medicare Other

## 2015-03-26 ENCOUNTER — Telehealth: Payer: Self-pay | Admitting: *Deleted

## 2015-03-26 NOTE — Telephone Encounter (Signed)
SENT TO DR.MAGRINAT'S NURSE, VAL DODD,RN.

## 2015-03-26 NOTE — Telephone Encounter (Signed)
Obtained reading of recent CT showing stable results.  Returned call to pt at given number and obtained identified VM.  Message left to return call to this RN.

## 2015-03-27 ENCOUNTER — Telehealth: Payer: Self-pay

## 2015-03-27 NOTE — Telephone Encounter (Signed)
Returned call to pt re: CT results.  Let pt know CT shows no mets in chest and stable liver.  Pt questioning lungs - has been told by PCP that she has pneumonia.  Let her know the CT shows radiation changes.  Pt would like Dr. [name] to call and discuss with her.  Message given to Dr. [name].

## 2015-03-28 ENCOUNTER — Telehealth: Payer: Self-pay | Admitting: Internal Medicine

## 2015-03-28 ENCOUNTER — Encounter: Payer: Self-pay | Admitting: *Deleted

## 2015-03-28 NOTE — Progress Notes (Signed)
[place] Work  Clinical Social Work was referred by scheduling for assessment of psychosocial needs due to possible transportation concerns.  Clinical Social Worker attempted to contact patient at home and left message to return call if transportation assistance needed.   Loren Racer, [place] Worker [place]  [place] Phone: [phone] Fax: [phone]

## 2015-03-28 NOTE — Telephone Encounter (Signed)
Rec'd from [facility] forward 5 pages to Dr. Ronnald Ramp

## 2015-03-29 ENCOUNTER — Telehealth: Payer: Self-pay | Admitting: *Deleted

## 2015-03-29 NOTE — Telephone Encounter (Signed)
error 

## 2015-03-29 NOTE — Telephone Encounter (Signed)
This RN spoke with pt to follow up on call from 8/17 regarding CT obtained in July 2016.  Area noted in lung concerning to pt if she " still had pneumonia ".  Per history given by pt - she went to her primary MD late June with increased SOB.  " they did xrays and then he called me the next morning and said I had pneumonia and put me on an antibiotic "  " then when I saw Dr [name] he did the CT "  At present only symptom is an occasional nonproductive cough.  This RN reviewed scan with pt including noted area in right lung which shows some changes in the lining probably from her prior radiation therapy .  Per discussion this RN reiterated no areas per this CT indicative of cancer.  Christy Harrell understands to call if symptoms worsens or she has new concerns.

## 2015-04-01 ENCOUNTER — Other Ambulatory Visit (HOSPITAL_BASED_OUTPATIENT_CLINIC_OR_DEPARTMENT_OTHER): Payer: Medicare Other

## 2015-04-01 ENCOUNTER — Ambulatory Visit (HOSPITAL_BASED_OUTPATIENT_CLINIC_OR_DEPARTMENT_OTHER): Payer: Medicare Other

## 2015-04-01 VITALS — BP 133/61 | HR 79 | Temp 98.1°F

## 2015-04-01 DIAGNOSIS — Z5111 Encounter for antineoplastic chemotherapy: Secondary | ICD-10-CM | POA: Diagnosis not present

## 2015-04-01 DIAGNOSIS — C787 Secondary malignant neoplasm of liver and intrahepatic bile duct: Secondary | ICD-10-CM | POA: Diagnosis not present

## 2015-04-01 DIAGNOSIS — C7951 Secondary malignant neoplasm of bone: Secondary | ICD-10-CM

## 2015-04-01 DIAGNOSIS — C50512 Malignant neoplasm of lower-outer quadrant of left female breast: Secondary | ICD-10-CM | POA: Diagnosis not present

## 2015-04-01 LAB — COMPREHENSIVE METABOLIC PANEL (CC13)
ALBUMIN: 2.8 g/dL — AB (ref 3.5–5.0)
ALT: 15 U/L (ref 0–55)
AST: 18 U/L (ref 5–34)
Alkaline Phosphatase: 85 U/L (ref 40–150)
Anion Gap: 8 mEq/L (ref 3–11)
BUN: 8.9 mg/dL (ref 7.0–26.0)
CHLORIDE: 111 meq/L — AB (ref 98–109)
CO2: 26 mEq/L (ref 22–29)
Calcium: 8.9 mg/dL (ref 8.4–10.4)
Creatinine: 0.8 mg/dL (ref 0.6–1.1)
EGFR: 88 mL/min/{1.73_m2} — ABNORMAL LOW (ref >90)
GLUCOSE: 139 mg/dL (ref 70–140)
Potassium: 4.2 mEq/L (ref 3.5–5.1)
SODIUM: 145 meq/L (ref 136–145)
Total Bilirubin: 0.38 mg/dL (ref 0.20–1.20)
Total Protein: 6.6 g/dL (ref 6.4–8.3)

## 2015-04-01 LAB — CBC WITH DIFFERENTIAL/PLATELET
BASO%: 0.2 % (ref 0.0–2.0)
BASOS ABS: 0 10*3/uL (ref 0.0–0.1)
EOS%: 2.8 % (ref 0.0–7.0)
Eosinophils Absolute: 0.1 10*3/uL (ref 0.0–0.5)
HCT: 33.7 % — ABNORMAL LOW (ref 34.8–46.6)
HEMOGLOBIN: 10.9 g/dL — AB (ref 11.6–15.9)
LYMPH%: 28.9 % (ref 14.0–49.7)
MCH: 25.2 pg (ref 25.1–34.0)
MCHC: 32.5 g/dL (ref 31.5–36.0)
MCV: 77.5 fL — ABNORMAL LOW (ref 79.5–101.0)
MONO#: 0.5 10*3/uL (ref 0.1–0.9)
MONO%: 8.9 % (ref 0.0–14.0)
NEUT#: 3.2 10*3/uL (ref 1.5–6.5)
NEUT%: 59.2 % (ref 38.4–76.8)
Platelets: 274 10*3/uL (ref 145–400)
RBC: 4.35 10*6/uL (ref 3.70–5.45)
RDW: 15 % — ABNORMAL HIGH (ref 11.2–14.5)
WBC: 5.4 10*3/uL (ref 3.9–10.3)
lymph#: 1.6 10*3/uL (ref 0.9–3.3)

## 2015-04-01 MED ORDER — FULVESTRANT 250 MG/5ML IM SOLN
500.0000 mg | Freq: Once | INTRAMUSCULAR | Status: AC
  Administered 2015-04-01: 500 mg via INTRAMUSCULAR
  Filled 2015-04-01: qty 10

## 2015-04-01 MED ORDER — DENOSUMAB 120 MG/1.7ML ~~LOC~~ SOLN
120.0000 mg | Freq: Once | SUBCUTANEOUS | Status: AC
  Administered 2015-04-01: 120 mg via SUBCUTANEOUS
  Filled 2015-04-01: qty 1.7

## 2015-04-04 ENCOUNTER — Encounter: Payer: Self-pay | Admitting: Cardiology

## 2015-04-04 ENCOUNTER — Telehealth: Payer: Self-pay | Admitting: Cardiology

## 2015-04-04 NOTE — Telephone Encounter (Signed)
Patient needs prophylaxis for chest CT.  Please see Radiology protocol

## 2015-04-04 NOTE — Telephone Encounter (Signed)
Left message for patient that I will call her next week with pre-medication instructions for cardiac CT.

## 2015-04-04 NOTE — Telephone Encounter (Addendum)
Patient is schedule for Cardiac Ct 04-17-15 with Dr. Meda Coffee to read.   She is allergic to contrast and will need 13 hour pre-medication.  Patient is aware the nurse will call to discuss medication.

## 2015-04-09 ENCOUNTER — Encounter: Payer: Self-pay | Admitting: Cardiology

## 2015-04-09 ENCOUNTER — Telehealth: Payer: Self-pay | Admitting: *Deleted

## 2015-04-09 DIAGNOSIS — M5412 Radiculopathy, cervical region: Secondary | ICD-10-CM

## 2015-04-09 DIAGNOSIS — M5030 Other cervical disc degeneration, unspecified cervical region: Secondary | ICD-10-CM

## 2015-04-09 DIAGNOSIS — G893 Neoplasm related pain (acute) (chronic): Secondary | ICD-10-CM

## 2015-04-09 DIAGNOSIS — M4692 Unspecified inflammatory spondylopathy, cervical region: Secondary | ICD-10-CM

## 2015-04-09 DIAGNOSIS — R079 Chest pain, unspecified: Secondary | ICD-10-CM

## 2015-04-09 MED ORDER — OXYCODONE HCL 5 MG PO TABS: 5.0000 mg | ORAL_TABLET | ORAL | Status: DC | PRN

## 2015-04-09 NOTE — Telephone Encounter (Signed)
done

## 2015-04-09 NOTE — Telephone Encounter (Signed)
Per Radiology Protocol for patients with history of iodinated contrast reaction, patient is to take: 1) PO Prednisone 50 mg at 13 hours, 7 hours, and 1 hour prior to study 2) PO Benadryl 50 mg at 1 hour prior to study  Patient's CT is scheduled for 1100, so patient to take prednisone at 2200 the night before and 0400 and 1000 the morning of her CT. Benadryl is to be taken at 1000.  Left message to call back.

## 2015-04-09 NOTE — Telephone Encounter (Signed)
Returned patient's call. Left message to call back.

## 2015-04-09 NOTE — Telephone Encounter (Signed)
Notified pt rx ready for pick-up. Place in cabinet up front...Christy Harrell

## 2015-04-09 NOTE — Telephone Encounter (Signed)
This encounter was created in error - please disregard.

## 2015-04-09 NOTE — Telephone Encounter (Signed)
New message ° ° ° ° °Pt returning your call °

## 2015-04-09 NOTE — Telephone Encounter (Signed)
Left msg on triage requesting refill on her pain med oxycodone...Johny Chess

## 2015-04-10 NOTE — Telephone Encounter (Signed)
Called patient to review instructions for CT contrast prophylaxis. Patient adamantly refuses, stating she will not inconvenience herself to take medications over 13 hours "just to have a scan."  Patient st she will not come to the appointment, and to cancel.  She st she is willing to do another test but nothing she has to prepare for.   To Dr. Radford Pax.

## 2015-04-10 NOTE — Telephone Encounter (Signed)
The only other option is a cath and that requires contrast prophylaxis as well and more risks involved since it is an invasive procedure.

## 2015-04-10 NOTE — Telephone Encounter (Signed)
Left message to call back  

## 2015-04-12 ENCOUNTER — Telehealth: Payer: Self-pay

## 2015-04-12 NOTE — Telephone Encounter (Signed)
Received a letter from [facility] regarding patient's medication exemestane.  The letter states that patient has not been taking this medication.  Writer spoke with patient and states that she is taking the medication and receives it from [employer]

## 2015-04-16 ENCOUNTER — Encounter: Payer: Self-pay | Admitting: Cardiology

## 2015-04-16 MED ORDER — PREDNISONE 50 MG PO TABS: ORAL_TABLET | ORAL | Status: DC

## 2015-04-16 MED ORDER — DIPHENHYDRAMINE HCL 50 MG PO TABS: ORAL_TABLET | ORAL | Status: DC

## 2015-04-16 NOTE — Telephone Encounter (Signed)
This encounter was created in error - please disregard.

## 2015-04-16 NOTE — Telephone Encounter (Signed)
New message      Talk to the nurse regarding her CT that she had scheduled.

## 2015-04-16 NOTE — Telephone Encounter (Signed)
Confirmed with pharmacy that Rx's will be delivered to patient tomorrow morning. Ordered: Benadryl 50 mg 1 tablet, Prednisone 50 mg 3 tablets Will call patient after test is rescheduled to review prophylaxis instructions.

## 2015-04-16 NOTE — Telephone Encounter (Signed)
Patient now agrees to CT scan, but does cannot get pre-medication Rx in time for tomorrow (patient does not drive).  Rx to be called into [facility] and they will deliver tomorrow.  Per patient request, instructed [facility] to call her to check her schedule before making appointment. Informed the patient that after her test is scheduled, I will call her with medication instructions. Patient agrees with treatment plan.

## 2015-04-17 ENCOUNTER — Ambulatory Visit (HOSPITAL_COMMUNITY): Payer: Medicare Other

## 2015-04-23 ENCOUNTER — Telehealth: Payer: Self-pay | Admitting: Cardiology

## 2015-04-23 NOTE — Telephone Encounter (Signed)
NewMessage  Pt requetsed to speak w/ RN to see if Lab work sched for 9/16 is necessary for CT on 9/26, or can she canc. Pt has trouble facilitating transportation. Please call back and discuss.

## 2015-04-23 NOTE — Telephone Encounter (Signed)
Confirmed with patient she received her medications from pharmacy. Reviewed medication instructions with patient for CT. Patient understands to take prednisone at 2200 the night before, prednisone at 0400 the morning of, and prednisone and benadryl at 1000 the day of the test.

## 2015-04-23 NOTE — Telephone Encounter (Signed)
Patient st it is difficult for her to get rides and requests her fasting labs moved to day of CT. Appointment moved.

## 2015-04-26 ENCOUNTER — Other Ambulatory Visit: Payer: Medicare Other

## 2015-04-29 ENCOUNTER — Encounter: Payer: Self-pay | Admitting: Nurse Practitioner

## 2015-04-29 ENCOUNTER — Ambulatory Visit (HOSPITAL_BASED_OUTPATIENT_CLINIC_OR_DEPARTMENT_OTHER): Payer: Medicare Other

## 2015-04-29 ENCOUNTER — Other Ambulatory Visit (HOSPITAL_BASED_OUTPATIENT_CLINIC_OR_DEPARTMENT_OTHER): Payer: Medicare Other

## 2015-04-29 ENCOUNTER — Ambulatory Visit (HOSPITAL_COMMUNITY): Payer: Medicare Other

## 2015-04-29 ENCOUNTER — Ambulatory Visit (HOSPITAL_BASED_OUTPATIENT_CLINIC_OR_DEPARTMENT_OTHER): Payer: Medicare Other | Admitting: Nurse Practitioner

## 2015-04-29 VITALS — BP 118/80 | HR 71 | Temp 98.3°F | Resp 18 | Ht 60.0 in | Wt 178.2 lb

## 2015-04-29 DIAGNOSIS — C7951 Secondary malignant neoplasm of bone: Secondary | ICD-10-CM

## 2015-04-29 DIAGNOSIS — C787 Secondary malignant neoplasm of liver and intrahepatic bile duct: Secondary | ICD-10-CM | POA: Diagnosis not present

## 2015-04-29 DIAGNOSIS — C50512 Malignant neoplasm of lower-outer quadrant of left female breast: Secondary | ICD-10-CM

## 2015-04-29 DIAGNOSIS — Z5111 Encounter for antineoplastic chemotherapy: Secondary | ICD-10-CM

## 2015-04-29 LAB — COMPREHENSIVE METABOLIC PANEL (CC13)
ALK PHOS: 88 U/L (ref 40–150)
ALT: 13 U/L (ref 0–55)
ANION GAP: 8 meq/L (ref 3–11)
AST: 22 U/L (ref 5–34)
Albumin: 3 g/dL — ABNORMAL LOW (ref 3.5–5.0)
BILIRUBIN TOTAL: 0.48 mg/dL (ref 0.20–1.20)
BUN: 10.7 mg/dL (ref 7.0–26.0)
CALCIUM: 8.6 mg/dL (ref 8.4–10.4)
CO2: 27 mEq/L (ref 22–29)
CREATININE: 0.8 mg/dL (ref 0.6–1.1)
Chloride: 109 mEq/L (ref 98–109)
EGFR: 80 mL/min/{1.73_m2} — ABNORMAL LOW (ref >90)
Glucose: 155 mg/dl — ABNORMAL HIGH (ref 70–140)
Potassium: 3.7 mEq/L (ref 3.5–5.1)
Sodium: 143 mEq/L (ref 136–145)
TOTAL PROTEIN: 6.8 g/dL (ref 6.4–8.3)

## 2015-04-29 LAB — CBC WITH DIFFERENTIAL/PLATELET
BASO%: 1.3 % (ref 0.0–2.0)
Basophils Absolute: 0.1 10*3/uL (ref 0.0–0.1)
EOS ABS: 0.2 10*3/uL (ref 0.0–0.5)
EOS%: 3.7 % (ref 0.0–7.0)
HEMATOCRIT: 33.4 % — AB (ref 34.8–46.6)
HGB: 11 g/dL — ABNORMAL LOW (ref 11.6–15.9)
LYMPH#: 1.3 10*3/uL (ref 0.9–3.3)
LYMPH%: 27.8 % (ref 14.0–49.7)
MCH: 25 pg — ABNORMAL LOW (ref 25.1–34.0)
MCHC: 33 g/dL (ref 31.5–36.0)
MCV: 75.6 fL — AB (ref 79.5–101.0)
MONO#: 0.5 10*3/uL (ref 0.1–0.9)
MONO%: 10 % (ref 0.0–14.0)
NEUT%: 57.2 % (ref 38.4–76.8)
NEUTROS ABS: 2.6 10*3/uL (ref 1.5–6.5)
PLATELETS: 274 10*3/uL (ref 145–400)
RBC: 4.41 10*6/uL (ref 3.70–5.45)
RDW: 15.7 % — ABNORMAL HIGH (ref 11.2–14.5)
WBC: 4.6 10*3/uL (ref 3.9–10.3)

## 2015-04-29 MED ORDER — FULVESTRANT 250 MG/5ML IM SOLN
500.0000 mg | Freq: Once | INTRAMUSCULAR | Status: AC
  Administered 2015-04-29: 500 mg via INTRAMUSCULAR
  Filled 2015-04-29: qty 10

## 2015-04-29 NOTE — Progress Notes (Signed)
ID: Christy Harrell   DOB: [birth year]  MR#: [id number]  BPZ#:[id number]  PCP: [name], MD GYN:  SU:  OTHER MD:  Alysia Penna, Frederik Pear, Faustino Congress  CC: Breast cancer stage IV  TREATMENT: exemestane, everolimus, fulvestrant, and denosumab  BREAST CANCER HISTORY: From the original intake note:  The patient had routine screening mammography 10/16/2010 showing a lobulated mass in the right subareolar region measuring up to 5.5 cm. The Left breast showed some central microcalcifications. Biopsy of the Right breast mass on 10/28/2010 showed an invasive ductal carcinoma, grade 2, estrogen receptor positive (Allred score 8), progesterone receptor positive (Allred score 5), with an equivocal HER-2. The Left breast area of microcalcifications was biopsied at the same time, and was read as suspicious for DCIS.  On 11/17/2010 the patient underwent Right lumpectomy and axillary lymph node dissection for what proved to be a 3 cm invasive ductal carcinoma, grade 2, with some papillary and mucinous features. One of 16 lymph nodes was involved. FISH showed no HER-2 amplification. Left breast biopsy was benign.  The patient had an Oncotype sent, with a score of 27, predicting a risk of distant recurrence after 5 years of tamoxifen in the 18% range. With this intermediate result, the decision was made not to proceed with chemotherapy, since the patient is the sole caregiver to her very elderly mother. Instead the patient proceeded to radiation treatment which was completed 03/06/2011. She started anastrozole at that point. Her subsequent history is as detailed below   INTERVAL HISTORY: Christy Harrell returns today for follow up of her metastatic breast cancer. She continues on exemestane and everolimus daily. She tolerates these well denying mucositis, pneumonitis, and vaginal dryness. She does however continue to have hot flashes regularly, which she manages on her  own. She receives fulvestrant monthly and denosumab every 3 months, which provide no issues for her.  REVIEW OF SYSTEMS: Christy Harrell denies fevers, chills, nausea, or vomiting. She takes colace to avoid constipation. Her appetite is down, and she is losing weight. Her energy level is up and down, but she continues to care for herself and her chores well. She has shortness of breath with exertion, but denies chest pain, cough, or palpitations. She has no headaches, dizziness, vision changes, or weakness. She fell over the summer and landed on her left wrist, now that area and forearm are swollen. A detailed review of systems is otherwise stable.  PAST MEDICAL HISTORY: Past Medical History  Diagnosis Date  . Club foot   . Arthritis   . Thyroid disease   . Hyperlipidemia   . Osteoporosis   . Hypertension   . GERD (gastroesophageal reflux disease)     watches diet  . Neuromuscular disorder     lt arm numb sometimes  . Wears glasses   . History of radiation therapy 02/22/13- 03/13/13    left proximal humerus 3500 cGy 14 sessions  . Lymphedema     right arm  . Diabetes mellitus     metformin  . Cancer     right breast  . 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 Metastasis from malignant tumor of breast     liver    PAST SURGICAL HISTORY: Past Surgical History  Procedure Laterality Date  . Cesarean section    . Thyroidectomy  2002  . Foot surgery      as a child for club foot  . Breast surgery  2012  rt lump-16 nodes-in [place]  . Cesarean section    . Colonoscopy    . Dilation and curettage of uterus    . Humerus im nail Left 12/27/2012    Procedure: INTRAMEDULLARY (IM) NAIL HUMERAL LEFT PATHOLOGIC FRACTURE;  Surgeon: Nita Sells, MD;  Location: [facility];  Service: Orthopedics;  Laterality: Left;    FAMILY HISTORY Family History  Problem Relation Age of Onset  . Cancer Neg Hx   . Arthritis Neg Hx   . Heart disease Neg Hx   .  Hyperlipidemia Neg Hx   . Hypertension Neg Hx   . Dementia Mother   . Hearing loss Mother    The patient's father died from unknown causes. The patient's mother is alive at age 74. The patient was a single child. There is no history of breast or ovarian cancer in the family to her knowledge.  GYNECOLOGIC HISTORY: Menarche age 74, menopause in her early 45s. The patient is GX P1, first pregnancy to term age 74 She did not take hormone replacement  SOCIAL HISTORY: The patient grew up in [place], attended [employer], then moved to [place] where she lived about 40 years. 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 Her mother, [name] away at 100, on 09/09/2014. The patient's daughter [name], 93, is disabled secondary to pulmonary hypertension. She can be reached at [phone]. The patient has 2 grandchildren, [name] who works at a Scientist, physiological business and is 74 years old, in general, 60, completing high school.   ADVANCED DIRECTIVES:not in place  HEALTH MAINTENANCE: Social History  Substance Use Topics  . Smoking status: Former Smoker    Quit date: 12/23/1974  . Smokeless tobacco: Never Used  . Alcohol Use: No     Colonoscopy:  PAP:    Bone density:  2012, on ibandronate chronically  Lipid panel: UTD  Allergies  Allergen Reactions  . Bydureon [Exenatide] Rash  . Gadolinium Derivatives Other (See Comments)    Pt began having chest numbness/tingling , stated her heart felt funny, had to take her to ED for EKG per RN   CAP    Current Outpatient Prescriptions  Medication Sig Dispense Refill  . aspirin EC 81 MG tablet Take 1 tablet (81 mg total) by mouth daily.    Marland Kitchen atorvastatin (LIPITOR) 80 MG tablet Take 1 tablet (80 mg total) by mouth daily. 30 tablet 11  . desonide (DESOWEN) 0.05 % lotion Apply topically 2 (two) times daily. 59 mL 1  . diclofenac sodium (VOLTAREN) 1 % GEL Apply 2 g topically 4 (four) times daily. 2 Tube  2  . docusate sodium (COLACE) 100 MG capsule Take 100 mg by mouth 2 (two) times daily as needed for constipation.    Marland Kitchen everolimus (AFINITOR) 5 MG tablet Take 1 tablet (5 mg total) by mouth daily. 90 tablet 4  . exemestane (AROMASIN) 25 MG tablet Take 1 tablet (25 mg total) by mouth daily. 30 tablet 2  . ezetimibe (ZETIA) 10 MG tablet Take 1 tablet (10 mg total) by mouth daily. 90 tablet 3  . fentaNYL (DURAGESIC) 25 MCG/HR patch Place 1 patch (25 mcg total) onto the skin every 3 (three) days. 10 patch 0  . furosemide (LASIX) 40 MG tablet Take 1 tablet (40 mg total) by mouth daily. 30 tablet 6  . levothyroxine (LEVOTHROID) 25 MCG tablet Take 1 tablet (25 mcg total) by mouth daily before breakfast. 90 tablet 1  .  levothyroxine (SYNTHROID) 200 MCG tablet Take 1 tablet (200 mcg total) by mouth daily before breakfast. 90 tablet 1  . lubiprostone (AMITIZA) 24 MCG capsule Take 1 capsule (24 mcg total) by mouth 2 (two) times daily with a meal. 180 capsule 1  . metFORMIN (GLUCOPHAGE) 500 MG tablet Take 1 tablet (500 mg total) by mouth daily. 90 tablet 3  . oxyCODONE (OXY IR/ROXICODONE) 5 MG immediate release tablet Take 1 tablet (5 mg total) by mouth every 4 (four) hours as needed for severe pain. Take 1-2 tablets every 4-6 hours as needed for pain - fill on or after 04/09/15 100 tablet 0  . Polyethyl Glycol-Propyl Glycol (SYSTANE ULTRA OP) Place 1 drop into both eyes daily.    . potassium chloride SA (K-DUR,KLOR-CON) 20 MEQ tablet Take 1 tablet (20 mEq total) by mouth 2 (two) times daily. 180 tablet 1  . pregabalin (LYRICA) 75 MG capsule Take 1 capsule (75 mg total) by mouth 2 (two) times daily. Not taking 60 capsule 5  . AFINITOR 10 MG tablet TAKE 1 TABLET BY MOUTH ONCE DAILY (Patient not taking: Reported on 04/29/2015) 28 tablet 2  . diphenhydrAMINE (BENADRYL) 50 MG tablet Take one hour prior to CT scan. (Patient not taking: Reported on 04/29/2015) 1 tablet 0  . LORazepam (ATIVAN) 0.5 MG tablet Take 15  minutes before MRI test; may repeat x 1 (Patient not taking: Reported on 04/29/2015) 10 tablet 0  . meloxicam (MOBIC) 7.5 MG tablet Take 1 tablet (7.5 mg total) by mouth daily. (Patient not taking: Reported on 04/29/2015) 20 tablet 0  . polyethylene glycol powder (GLYCOLAX/MIRALAX) powder Take 255 g (1 Container total) by mouth daily. (Patient not taking: Reported on 04/29/2015) 255 g 3  . predniSONE (DELTASONE) 5 MG tablet Take 4 tablets with food 30 minutes before MRI. (Patient not taking: Reported on 04/29/2015) 12 tablet 0  . predniSONE (DELTASONE) 50 MG tablet Take 13 hours, 7 hours, and 1 hour prior to CT scan. (Patient not taking: Reported on 04/29/2015) 3 tablet 0  . prochlorperazine (COMPAZINE) 5 MG tablet Take 1 tablet (5 mg total) by mouth every 6 (six) hours as needed for nausea or vomiting. (Patient not taking: Reported on 04/29/2015) 90 tablet 6   No current facility-administered medications for this visit.   PHYSICAL EXAM: Elderly African American woman who appears stated age 74 Vitals:   04/29/15 1141  BP: 118/80  Pulse: 71  Temp: 98.3 F (36.8 C)  Resp: 18   Body mass index is 34.8 kg/(m^2).    ECOG FS: 2 Filed Weights   04/29/15 1141  Weight: 178 lb 3.2 oz (80.831 kg)   Skin: warm, dry  HEENT: sclerae anicteric, conjunctivae pink, oropharynx clear. No thrush or mucositis.  Lymph Nodes: No cervical or supraclavicular lymphadenopathy  Lungs: clear to auscultation bilaterally, no rales, wheezes, or rhonci  Heart: regular rate and rhythm  Abdomen: round, soft, non tender, positive bowel sounds  Musculoskeletal: No focal spinal tenderness, chronic grade 3 right upper extremity lymphedema, bilateral grade 2 lower extremity edema, grade 1 left upper extremity edema Neuro: non focal, well oriented, positive affect  Breasts:  Right breast status post lumpectomy and radiation. Skin thickening unchanged. Right axilla benign. Left breast unremarkable.  LAB RESULTS: Lab Results   Component Value Date   WBC 4.6 04/29/2015   NEUTROABS 2.6 04/29/2015   HGB 11.0* 04/29/2015   HCT 33.4* 04/29/2015   MCV 75.6* 04/29/2015   PLT 274 04/29/2015  Chemistry      Component Value Date/Time   NA 143 04/29/2015 1114   NA 142 02/12/2015 1520   K 3.7 04/29/2015 1114   K 3.8 02/12/2015 1520   CL 106 02/12/2015 1520   CL 105 01/23/2013 1337   CO2 27 04/29/2015 1114   CO2 28 02/12/2015 1520   BUN 10.7 04/29/2015 1114   BUN 11 02/12/2015 1520   CREATININE 0.8 04/29/2015 1114   CREATININE 0.89 02/12/2015 1520      Component Value Date/Time   CALCIUM 8.6 04/29/2015 1114   CALCIUM 9.1 02/12/2015 1520   ALKPHOS 88 04/29/2015 1114   ALKPHOS 64 02/20/2015 0759   AST 22 04/29/2015 1114   AST 18 02/20/2015 0759   ALT 13 04/29/2015 1114   ALT 10 02/20/2015 0759   BILITOT 0.48 04/29/2015 1114   BILITOT 0.4 02/20/2015 0759       Lab Results  Component Value Date   LABCA2 24 11/19/2011    STUDIES:  Nuclear stress EF: 62%.  There was no ST segment deviation noted during stress.  Findings consistent with prior myocardial infarction.  This is a low risk study. Abnormal.  Mild inferior hypokinesis.  There is a small defect of severe severity present in the apical anterior and apical septal location. The defect is non-reversible. 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    No results found.  ASSESSMENT: 74 y.o.  [place] woman with stage IV [bone only] breast cancer in a  (1)  status post right lumpectomy and axillary lymph node dissection 11/17/2010 for a pT2 pN1, stage IIB invasive ductal carcinoma, grade 2, estrogen and progesterone receptor positive, HER-2 negative,   (2) Oncotype DX recurrence score of 27 predicting a risk of distant recurrence of 18% with 5 years of tamoxifen (intermediate score);   (3)  status post radiation completed July of 2012,   (4) started anastrozole July 2012, discontinued June 2014 with evidence of metastatic spread to  bone  (5) Chronic lymphedema in the right upper extremity.  (6) pathologic fracture of the left humerus along apparent lytic lesion, with no other bone lesions per bone scan 12/12/2012  (7) on 12/27/2012 underwent #1 intramedullary nail left pathologic proximal humerus fracture  #2 left shoulder rotator cuff repair  #3 left shoulder open bone biopsy with pathology showing metastatic breast adenocarcinoma, estrogen receptor 100% positive, progesterone receptor and HER-2 negative, with an MIB-1 of 26%. PET scan 01/13/2013 showed no other areas of disease and  (8) status post 35 Gy to the left proximal humerus completed 03/13/2013(  (9) fulvestrant, first injections on 01/23/2013  (10) zolendronic acid given monthly, first dose 01/23/2013; changed to denosumab as of August 2015 because of access problems  (11) multiple liver lesions noted on scans April 2015   (12) letrozole added May 2015, stopped 04/18/14 because of progression in her liver noted on a chest CT performed on 03/14/14  (13) everolimus and exemestane started 04/19/14  (a) MRI of the abdomen 02/25/2015 shows continuing response   PLAN:  Yajaira continues to manage her prescribed treatments well, with few complaints. The labs were reviewed in detail and were stable. She will proceed with her fulvestrant injection today. denosumab will be due next in November.   She plans to follow up with orthopedics regarding the swelling to her left hand/forearm. She has a history of shoulder injuries and wonders if a "pin" became dislodged during her fall.   Toleen will return in November for a follow up visit already scheduled  with Dr. [name]. Prior to this visit she will have a repeat liver MRI. She understands and agrees with this plan. She knows the goal of treatment in her case is control. She has been encouraged to call with any issues that might arise before her next visit here.   Laurie Panda, NP  04/29/2015

## 2015-05-03 ENCOUNTER — Telehealth: Payer: Self-pay | Admitting: Cardiology

## 2015-05-03 ENCOUNTER — Encounter: Payer: Self-pay | Admitting: Podiatry

## 2015-05-03 ENCOUNTER — Ambulatory Visit (INDEPENDENT_AMBULATORY_CARE_PROVIDER_SITE_OTHER): Payer: Medicare Other | Admitting: Podiatry

## 2015-05-03 ENCOUNTER — Telehealth: Payer: Self-pay | Admitting: *Deleted

## 2015-05-03 DIAGNOSIS — B351 Tinea unguium: Secondary | ICD-10-CM | POA: Diagnosis not present

## 2015-05-03 DIAGNOSIS — L84 Corns and callosities: Secondary | ICD-10-CM

## 2015-05-03 DIAGNOSIS — M79676 Pain in unspecified toe(s): Secondary | ICD-10-CM

## 2015-05-03 MED ORDER — KETOCONAZOLE 2 % EX CREA: 1.0000 "application " | TOPICAL_CREAM | Freq: Every day | CUTANEOUS | Status: DC

## 2015-05-03 NOTE — Progress Notes (Signed)
Patient ID: Christy Harrell, female   DOB: 05-18-1941, 74 y.o.   MRN: [id number]  Subjective: 74 y.o.-year-old female returns the office today for painful, elongated, thickened toenails which she is unable to trim herself. Denies any redness or drainage around the nails. She also has a painful callus on her right foot. Denies any redness or drainage. Denies any acute changes since last appointment and no new complaints today. Denies any systemic complaints such as fevers, chills, nausea, vomiting.   Objective: AAO 3, NAD DP/PT pulses palpable, CRT less than 3 seconds Chronic appearing edema to bilateral lower extremities. Protective sensation intact with Derrel Nip monofilament Nails hypertrophic, dystrophic, elongated, brittle, discolored 10. There is tenderness overlying the nails 1-5 bilaterally. There is no surrounding erythema or drainage along the nail sites. Hyperkeratotic lesion rightsubmetatarsal one. Upon debridement no underlying ulceration, drainage or other clinical signs of infection. No open lesions or other pre-ulcerative lesions are identified. No other areas of tenderness bilateral lower extremities. No overlying edema, erythema, increased warmth. No pain with calf compression, swelling, warmth, erythema.  Assessment: Patient presents with symptomatic onychomycosis; hyperkeratotic lesions  Plan: -Treatment options including alternatives, risks, complications were discussed -Nails sharply debrided 10 without complication/bleeding. -Hyperkeratotic lesion sharply debrided without complications/bleeding. -Discussed daily foot inspection. If there are any changes, to call the office immediately.  -Follow-up in 3 months or sooner if any problems are to arise. In the meantime, encouraged to call the office with any questions, concerns, changes symptoms.  Celesta Gentile, DPM

## 2015-05-03 NOTE — Telephone Encounter (Signed)
Patient called and stated we sent the prescription to the wrong pharmacy and I e-scribed it to [facility] and called and left the patient a message that it would be there. Christy Harrell

## 2015-05-03 NOTE — Telephone Encounter (Signed)
Christy Harrell was schedule for Cardiac Ct on 05-06-15 @ 11:00.  Test was cancel due to scanner is down.   I will call her back to reschedule test when scanner is back up.

## 2015-05-06 ENCOUNTER — Ambulatory Visit (HOSPITAL_COMMUNITY): Payer: Medicare Other

## 2015-05-06 ENCOUNTER — Other Ambulatory Visit: Payer: Medicare Other

## 2015-05-17 ENCOUNTER — Encounter: Payer: Self-pay | Admitting: Cardiology

## 2015-05-23 ENCOUNTER — Encounter: Payer: Self-pay | Admitting: Internal Medicine

## 2015-05-23 ENCOUNTER — Ambulatory Visit (INDEPENDENT_AMBULATORY_CARE_PROVIDER_SITE_OTHER)
Admission: RE | Admit: 2015-05-23 | Discharge: 2015-05-23 | Disposition: A | Discharge status: Home / Self Care | Payer: Medicare Other | Source: Ambulatory Visit | Attending: Internal Medicine | Admitting: Internal Medicine

## 2015-05-23 ENCOUNTER — Ambulatory Visit (INDEPENDENT_AMBULATORY_CARE_PROVIDER_SITE_OTHER): Payer: Medicare Other | Admitting: Internal Medicine

## 2015-05-23 ENCOUNTER — Other Ambulatory Visit (INDEPENDENT_AMBULATORY_CARE_PROVIDER_SITE_OTHER): Payer: Medicare Other

## 2015-05-23 VITALS — BP 128/68 | HR 101 | Temp 98.8°F | Resp 18 | Wt 169.0 lb

## 2015-05-23 DIAGNOSIS — L853 Xerosis cutis: Secondary | ICD-10-CM

## 2015-05-23 DIAGNOSIS — R059 Cough, unspecified: Secondary | ICD-10-CM

## 2015-05-23 DIAGNOSIS — R05 Cough: Secondary | ICD-10-CM

## 2015-05-23 DIAGNOSIS — L850 Acquired ichthyosis: Secondary | ICD-10-CM | POA: Diagnosis not present

## 2015-05-23 DIAGNOSIS — I890 Lymphedema, not elsewhere classified: Secondary | ICD-10-CM

## 2015-05-23 DIAGNOSIS — E8989 Other postprocedural endocrine and metabolic complications and disorders: Secondary | ICD-10-CM | POA: Diagnosis not present

## 2015-05-23 DIAGNOSIS — R5381 Other malaise: Secondary | ICD-10-CM | POA: Diagnosis not present

## 2015-05-23 LAB — CBC WITH DIFFERENTIAL/PLATELET
Basophils Absolute: 0.1 10*3/uL (ref 0.0–0.1)
Basophils Relative: 1.3 % (ref 0.0–3.0)
EOS PCT: 2.9 % (ref 0.0–5.0)
Eosinophils Absolute: 0.2 10*3/uL (ref 0.0–0.7)
HCT: 34.1 % — ABNORMAL LOW (ref 36.0–46.0)
Hemoglobin: 11 g/dL — ABNORMAL LOW (ref 12.0–15.0)
LYMPHS ABS: 1.7 10*3/uL (ref 0.7–4.0)
Lymphocytes Relative: 30.7 % (ref 12.0–46.0)
MCHC: 32.4 g/dL (ref 30.0–36.0)
MCV: 75.1 fl — ABNORMAL LOW (ref 78.0–100.0)
MONO ABS: 0.7 10*3/uL (ref 0.1–1.0)
Monocytes Relative: 12.8 % — ABNORMAL HIGH (ref 3.0–12.0)
NEUTROS ABS: 3 10*3/uL (ref 1.4–7.7)
NEUTROS PCT: 52.3 % (ref 43.0–77.0)
PLATELETS: 326 10*3/uL (ref 150.0–400.0)
RBC: 4.54 Mil/uL (ref 3.87–5.11)
RDW: 15.3 % (ref 11.5–15.5)
WBC: 5.7 10*3/uL (ref 4.0–10.5)

## 2015-05-23 NOTE — Progress Notes (Signed)
Pre visit review using our clinic review tool, if applicable. No additional management support is needed unless otherwise documented below in the visit note. 

## 2015-05-23 NOTE — Progress Notes (Signed)
   Subjective:    Patient ID: Christy Harrell, female    DOB: Aug 14, 1940, 74 y.o.   MRN: [id number]  HPI  She describes a dry cough for one week and is fearful that she has pneumonia. She states she's had pneumonia multiple times despite stopping smoking in 1976. This is associated with some shortness of breath.  She's also had chills and sweating.  She denies definite fever. She has not had definite upper respiratory tract infection symptoms.  She is not sure whether her leg swelling is related to being on a chemotherapeutic agent.  She describes anorexia; she is able to eat only 2-3 bites @ a time. She complains of a bad taste in her mouth.  She's had a diffuse rash of the trunk and extremities. The lesions on the legs have been treated with ketoconazole.   Review of Systems Frontal headache, facial pain , nasal purulence, dental pain, sore throat , otic pain or otic discharge denied.  There is no sputum production,hemoptysis, wheezing,or  paroxysmal nocturnal dyspnea. Unexplained weight loss, abdominal pain, significant dyspepsia, dysphagia, melena, rectal bleeding, or persistently small caliber stools are not present. Dysuria, pyuria, hematuria, frequency, nocturia or polyuria are denied.     Objective:   Physical Exam  Pertinent or positive findings include: She appears somewhat chronically ill. She has severe caries mainly of the mandibular teeth. She has significant lymphedema the right upper extremity despite wearing the compression device. Rales are present mainly in the right lower lobe. Edema is present in the right ankle greater than the left. She has hyperpigmented plaque-like lesions over the right anterior ankle and at the superior aspect of an left Achilles operative scar. She has dry skin over the upper back and left upper extremity with excoriations.  General appearance :adequately nourished; in no distress.  Eyes: No conjunctival inflammation or scleral icterus is  present.  Oral exam:  Lips and gums are healthy appearing.There is no oropharyngeal erythema or exudate noted.  Heart:  Normal rate and regular rhythm. S1 and S2 normal without gallop, murmur, click, rub or other extra sounds    Lungs:No increased work of breathing.   Abdomen: bowel sounds normal, soft and non-tender without masses, organomegaly or hernias noted.  No guarding or rebound. No flank tenderness to percussion.  Vascular : all pulses equal ; no bruits present.  Skin:Warm & dry; no tenting   Lymphatic: No lymphadenopathy is noted about the head, neck, axilla.   Neuro: Strength, tone decreased.       Assessment & Plan:  #1 cough with a history of recurrent pneumonia  #2 malaise  #3 rash/itching due to dry skin   Plan: See orders recommendations

## 2015-05-23 NOTE — Patient Instructions (Signed)
Use  Aveeno Daily  Moisturizing Lotion  twice a day  for the dry skin. Bathe with moisturizing liquid soap , not bar soap.   Your next office appointment will be determined based upon review of your pending labs  and  xrays  Those written interpretation of the lab results and instructions will be transmitted to you by mail for your records.  Critical results will be called.   Followup as needed for any active or acute issue. Please report any significant change in your symptoms.

## 2015-05-24 ENCOUNTER — Telehealth: Payer: Self-pay | Admitting: Internal Medicine

## 2015-05-24 ENCOUNTER — Other Ambulatory Visit: Payer: Self-pay | Admitting: Internal Medicine

## 2015-05-24 DIAGNOSIS — E038 Other specified hypothyroidism: Secondary | ICD-10-CM

## 2015-05-24 LAB — BASIC METABOLIC PANEL
BUN: 12 mg/dL (ref 6–23)
CALCIUM: 9.3 mg/dL (ref 8.4–10.5)
CO2: 29 meq/L (ref 19–32)
Chloride: 104 mEq/L (ref 96–112)
Creatinine, Ser: 0.83 mg/dL (ref 0.40–1.20)
GFR: 86.47 mL/min (ref >60.00)
GLUCOSE: 109 mg/dL — AB (ref 70–99)
POTASSIUM: 3.9 meq/L (ref 3.5–5.1)
Sodium: 143 mEq/L (ref 135–145)

## 2015-05-24 LAB — TSH: TSH: 0.03 u[IU]/mL — AB (ref 0.35–4.50)

## 2015-05-24 NOTE — Telephone Encounter (Signed)
Patient called regarding her lab and xray results from yesterday and about any prescriptions that Dr. Linus Orn may have written for her. She doesn't drive and she has a ride this morning to take her to drug store.  Please give her a call

## 2015-05-24 NOTE — Telephone Encounter (Signed)
Spoke with pt to inform that chest xray did not show any Cardiopulmonary findings. Lab results will be mailed to pt once they have been resulted.

## 2015-05-27 ENCOUNTER — Other Ambulatory Visit (HOSPITAL_BASED_OUTPATIENT_CLINIC_OR_DEPARTMENT_OTHER): Payer: Medicare Other

## 2015-05-27 ENCOUNTER — Ambulatory Visit (HOSPITAL_BASED_OUTPATIENT_CLINIC_OR_DEPARTMENT_OTHER): Payer: Medicare Other

## 2015-05-27 ENCOUNTER — Other Ambulatory Visit: Payer: Self-pay | Admitting: Nurse Practitioner

## 2015-05-27 VITALS — BP 126/60 | HR 80 | Temp 99.1°F

## 2015-05-27 DIAGNOSIS — C7951 Secondary malignant neoplasm of bone: Secondary | ICD-10-CM

## 2015-05-27 DIAGNOSIS — Z5111 Encounter for antineoplastic chemotherapy: Secondary | ICD-10-CM

## 2015-05-27 DIAGNOSIS — C787 Secondary malignant neoplasm of liver and intrahepatic bile duct: Secondary | ICD-10-CM

## 2015-05-27 DIAGNOSIS — C50512 Malignant neoplasm of lower-outer quadrant of left female breast: Secondary | ICD-10-CM

## 2015-05-27 LAB — COMPREHENSIVE METABOLIC PANEL (CC13)
ALBUMIN: 2.7 g/dL — AB (ref 3.5–5.0)
ALK PHOS: 96 U/L (ref 40–150)
ALT: 12 U/L (ref 0–55)
AST: 17 U/L (ref 5–34)
Anion Gap: 7 mEq/L (ref 3–11)
BUN: 11.4 mg/dL (ref 7.0–26.0)
CALCIUM: 8.7 mg/dL (ref 8.4–10.4)
CO2: 24 mEq/L (ref 22–29)
CREATININE: 0.8 mg/dL (ref 0.6–1.1)
Chloride: 111 mEq/L — ABNORMAL HIGH (ref 98–109)
EGFR: 88 mL/min/{1.73_m2} — ABNORMAL LOW (ref >90)
GLUCOSE: 142 mg/dL — AB (ref 70–140)
POTASSIUM: 3.6 meq/L (ref 3.5–5.1)
SODIUM: 142 meq/L (ref 136–145)
TOTAL PROTEIN: 6.9 g/dL (ref 6.4–8.3)
Total Bilirubin: 0.4 mg/dL (ref 0.20–1.20)

## 2015-05-27 LAB — CBC WITH DIFFERENTIAL/PLATELET
BASO%: 2.1 % — AB (ref 0.0–2.0)
Basophils Absolute: 0.1 10*3/uL (ref 0.0–0.1)
EOS%: 3.4 % (ref 0.0–7.0)
Eosinophils Absolute: 0.1 10*3/uL (ref 0.0–0.5)
HEMATOCRIT: 31.1 % — AB (ref 34.8–46.6)
HEMOGLOBIN: 10.1 g/dL — AB (ref 11.6–15.9)
LYMPH#: 1.1 10*3/uL (ref 0.9–3.3)
LYMPH%: 26.3 % (ref 14.0–49.7)
MCH: 24.2 pg — AB (ref 25.1–34.0)
MCHC: 32.5 g/dL (ref 31.5–36.0)
MCV: 74.4 fL — ABNORMAL LOW (ref 79.5–101.0)
MONO#: 0.4 10*3/uL (ref 0.1–0.9)
MONO%: 9.4 % (ref 0.0–14.0)
NEUT%: 58.8 % (ref 38.4–76.8)
NEUTROS ABS: 2.5 10*3/uL (ref 1.5–6.5)
Platelets: 295 10*3/uL (ref 145–400)
RBC: 4.18 10*6/uL (ref 3.70–5.45)
RDW: 15 % — AB (ref 11.2–14.5)
WBC: 4.3 10*3/uL (ref 3.9–10.3)

## 2015-05-27 MED ORDER — DENOSUMAB 120 MG/1.7ML ~~LOC~~ SOLN
120.0000 mg | Freq: Once | SUBCUTANEOUS | Status: AC
  Administered 2015-05-27: 120 mg via SUBCUTANEOUS
  Filled 2015-05-27: qty 1.7

## 2015-05-27 MED ORDER — FULVESTRANT 250 MG/5ML IM SOLN
500.0000 mg | Freq: Once | INTRAMUSCULAR | Status: AC
  Administered 2015-05-27: 500 mg via INTRAMUSCULAR
  Filled 2015-05-27: qty 10

## 2015-05-28 ENCOUNTER — Telehealth: Payer: Self-pay | Admitting: Cardiology

## 2015-05-28 NOTE — Telephone Encounter (Signed)
Informed patient that CMET was completed so she will not need anymore lab work. Patient agrees with treatment plan and is grateful for call back.

## 2015-05-28 NOTE — Telephone Encounter (Signed)
New Message  Pt calling to see if lab work taken yesterday 10/17 @ [facility] will be ok for CT on Mon 10/24, or if she needs more labwork. Please call back and discuss.

## 2015-05-30 ENCOUNTER — Ambulatory Visit: Payer: Medicare Other | Admitting: Internal Medicine

## 2015-06-03 ENCOUNTER — Telehealth: Payer: Self-pay

## 2015-06-03 ENCOUNTER — Ambulatory Visit (HOSPITAL_COMMUNITY): Admission: RE | Admit: 2015-06-03 | Payer: Medicare Other | Source: Ambulatory Visit

## 2015-06-03 NOTE — Telephone Encounter (Signed)
Please find out from [facility] CT radiology if patient had prophylaxis before the CT scans she has had

## 2015-06-03 NOTE — Telephone Encounter (Signed)
Kelly from [employer]. called before re-scheduling test after patient went to [facility] instead of [facility].  She wants clarification from Dr. Radford Pax. Patient is allergic to gadolinium, which in in MRIs, but patient is getting CT. Patient is not allergic to iodine or contrast. She wants to know if CT can simply be rescheduled or if prophylaxis is necessary.  To Dr. Radford Pax.

## 2015-06-04 NOTE — Telephone Encounter (Signed)
Christy Harrell st patient had CT with contrast at the end of July, 2016 and no prophylaxis was documented being used. The patient had no complaints and did fine with the contrast at that time.

## 2015-06-06 NOTE — Telephone Encounter (Signed)
SHe has no allergy to iodinated contrast agents

## 2015-06-10 NOTE — Telephone Encounter (Signed)
Left message to call back  

## 2015-06-10 NOTE — Telephone Encounter (Signed)
Will notify [facility] to schedule. No prophylaxis needed.

## 2015-06-19 ENCOUNTER — Other Ambulatory Visit (INDEPENDENT_AMBULATORY_CARE_PROVIDER_SITE_OTHER): Payer: Medicare Other

## 2015-06-19 ENCOUNTER — Encounter: Payer: Self-pay | Admitting: Internal Medicine

## 2015-06-19 ENCOUNTER — Ambulatory Visit (INDEPENDENT_AMBULATORY_CARE_PROVIDER_SITE_OTHER): Payer: Medicare Other | Admitting: Internal Medicine

## 2015-06-19 VITALS — BP 112/78 | HR 88 | Temp 98.4°F | Resp 20 | Ht 60.0 in | Wt 174.0 lb

## 2015-06-19 DIAGNOSIS — E038 Other specified hypothyroidism: Secondary | ICD-10-CM

## 2015-06-19 DIAGNOSIS — IMO0002 Reserved for concepts with insufficient information to code with codable children: Secondary | ICD-10-CM

## 2015-06-19 DIAGNOSIS — E1140 Type 2 diabetes mellitus with diabetic neuropathy, unspecified: Secondary | ICD-10-CM

## 2015-06-19 DIAGNOSIS — R600 Localized edema: Secondary | ICD-10-CM | POA: Diagnosis not present

## 2015-06-19 DIAGNOSIS — L301 Dyshidrosis [pompholyx]: Secondary | ICD-10-CM | POA: Diagnosis not present

## 2015-06-19 DIAGNOSIS — E1165 Type 2 diabetes mellitus with hyperglycemia: Secondary | ICD-10-CM | POA: Diagnosis not present

## 2015-06-19 LAB — BASIC METABOLIC PANEL
BUN: 11 mg/dL (ref 6–23)
CALCIUM: 9.1 mg/dL (ref 8.4–10.5)
CO2: 26 mEq/L (ref 19–32)
CREATININE: 0.81 mg/dL (ref 0.40–1.20)
Chloride: 106 mEq/L (ref 96–112)
GFR: 88.92 mL/min (ref >60.00)
Glucose, Bld: 230 mg/dL — ABNORMAL HIGH (ref 70–99)
Potassium: 3.8 mEq/L (ref 3.5–5.1)
Sodium: 142 mEq/L (ref 135–145)

## 2015-06-19 LAB — HEMOGLOBIN A1C: HEMOGLOBIN A1C: 7.1 % — AB (ref 4.6–6.5)

## 2015-06-19 LAB — TSH: TSH: 6.06 u[IU]/mL — ABNORMAL HIGH (ref 0.35–4.50)

## 2015-06-19 MED ORDER — DOXEPIN HCL 25 MG PO CAPS: 25.0000 mg | ORAL_CAPSULE | Freq: Every evening | ORAL | Status: DC | PRN

## 2015-06-19 MED ORDER — METFORMIN HCL 500 MG PO TABS: 500.0000 mg | ORAL_TABLET | Freq: Every day | ORAL | Status: DC

## 2015-06-19 MED ORDER — LEVOTHYROXINE SODIUM 150 MCG PO TABS
150.0000 ug | ORAL_TABLET | Freq: Every day | ORAL | Status: DC
Start: 2015-06-19 — End: 2015-09-10

## 2015-06-19 MED ORDER — FUROSEMIDE 40 MG PO TABS: 40.0000 mg | ORAL_TABLET | Freq: Every day | ORAL | Status: DC

## 2015-06-19 MED ORDER — FLUOCINONIDE-E 0.05 % EX CREA: 1.0000 "application " | TOPICAL_CREAM | Freq: Two times a day (BID) | CUTANEOUS | Status: DC

## 2015-06-19 NOTE — Progress Notes (Signed)
Subjective:  Patient ID: Christy Harrell, female    DOB: [birth year]  Age: 74 y.o. MRN: [id number]  CC: Rash; Hypothyroidism; and Diabetes   HPI Christy Harrell presents for the complaint of a rash. She complains of itchy dry spots on her left upper arm and her right lower extremity. She also has a need to spot behind her right shoulder. She has not been treating this in any way. She says the itching is intense. She is also here for me to recheck her thyroid level and follow-up on diabetes.  Outpatient Prescriptions Prior to Visit  Medication Sig Dispense Refill  . AFINITOR 10 MG tablet TAKE 1 TABLET BY MOUTH ONCE DAILY 28 tablet 2  . aspirin EC 81 MG tablet Take 1 tablet (81 mg total) by mouth daily.    Marland Kitchen atorvastatin (LIPITOR) 80 MG tablet Take 1 tablet (80 mg total) by mouth daily. 30 tablet 11  . diclofenac sodium (VOLTAREN) 1 % GEL Apply 2 g topically 4 (four) times daily. 2 Tube 2  . docusate sodium (COLACE) 100 MG capsule Take 100 mg by mouth 2 (two) times daily as needed for constipation.    Marland Kitchen everolimus (AFINITOR) 5 MG tablet Take 1 tablet (5 mg total) by mouth daily. 90 tablet 4  . exemestane (AROMASIN) 25 MG tablet Take 1 tablet (25 mg total) by mouth daily. 30 tablet 2  . ezetimibe (ZETIA) 10 MG tablet Take 1 tablet (10 mg total) by mouth daily. 90 tablet 3  . ketoconazole (NIZORAL) 2 % cream Apply 1 application topically daily. 60 g 2  . LORazepam (ATIVAN) 0.5 MG tablet Take 15 minutes before MRI test; may repeat x 1 10 tablet 0  . lubiprostone (AMITIZA) 24 MCG capsule Take 1 capsule (24 mcg total) by mouth 2 (two) times daily with a meal. 180 capsule 1  . oxyCODONE (OXY IR/ROXICODONE) 5 MG immediate release tablet Take 1 tablet (5 mg total) by mouth every 4 (four) hours as needed for severe pain. Take 1-2 tablets every 4-6 hours as needed for pain - fill on or after 04/09/15 100 tablet 0  . Polyethyl Glycol-Propyl Glycol (SYSTANE ULTRA OP) Place 1 drop into both eyes daily.     . polyethylene glycol powder (GLYCOLAX/MIRALAX) powder Take 255 g (1 Container total) by mouth daily. 255 g 3  . potassium chloride SA (K-DUR,KLOR-CON) 20 MEQ tablet Take 1 tablet (20 mEq total) by mouth 2 (two) times daily. 180 tablet 1  . predniSONE (DELTASONE) 5 MG tablet Take 4 tablets with food 30 minutes before MRI. 12 tablet 0  . predniSONE (DELTASONE) 50 MG tablet Take 13 hours, 7 hours, and 1 hour prior to CT scan. 3 tablet 0  . pregabalin (LYRICA) 75 MG capsule Take 1 capsule (75 mg total) by mouth 2 (two) times daily. Not taking 60 capsule 5  . prochlorperazine (COMPAZINE) 5 MG tablet Take 1 tablet (5 mg total) by mouth every 6 (six) hours as needed for nausea or vomiting. 90 tablet 6  . fentaNYL (DURAGESIC) 25 MCG/HR patch Place 1 patch (25 mcg total) onto the skin every 3 (three) days. 10 patch 0  . furosemide (LASIX) 40 MG tablet Take 1 tablet (40 mg total) by mouth daily. 30 tablet 6  . levothyroxine (LEVOTHROID) 25 MCG tablet Take 1 tablet (25 mcg total) by mouth daily before breakfast. 90 tablet 1  . levothyroxine (SYNTHROID) 200 MCG tablet 1/2 daily 90 tablet 1  . meloxicam (MOBIC) 7.5 MG tablet Take  1 tablet (7.5 mg total) by mouth daily. 20 tablet 0  . metFORMIN (GLUCOPHAGE) 500 MG tablet Take 1 tablet (500 mg total) by mouth daily. 90 tablet 3  . desonide (DESOWEN) 0.05 % lotion Apply topically 2 (two) times daily. (Patient not taking: Reported on 06/19/2015) 59 mL 1  . diphenhydrAMINE (BENADRYL) 50 MG tablet Take one hour prior to CT scan. (Patient not taking: Reported on 06/19/2015) 1 tablet 0   No facility-administered medications prior to visit.    ROS Review of Systems  Constitutional: Negative.  Negative for fever, chills, diaphoresis, appetite change and fatigue.  HENT: Negative.  Negative for trouble swallowing and voice change.   Eyes: Negative.   Respiratory: Negative.  Negative for cough, choking, chest tightness, shortness of breath and stridor.     Cardiovascular: Positive for leg swelling (chronic edema over both ankles). Negative for chest pain and palpitations.  Gastrointestinal: Negative.  Negative for nausea, vomiting, abdominal pain, diarrhea and constipation.  Endocrine: Negative.   Genitourinary: Negative.  Negative for difficulty urinating.  Musculoskeletal: Positive for arthralgias. Negative for myalgias and back pain.  Skin: Positive for rash. Negative for color change and wound.  Allergic/Immunologic: Negative.   Neurological: Negative.   Hematological: Negative.  Negative for adenopathy. Does not bruise/bleed easily.  Psychiatric/Behavioral: Negative.     Objective:  BP 112/78 mmHg  Pulse 88  Temp(Src) 98.4 F (36.9 C) (Oral)  Resp 20  Ht 5' (1.524 m)  Wt 174 lb (78.926 kg)  BMI 33.98 kg/m2  SpO2 96%  BP Readings from Last 3 Encounters:  06/19/15 112/78  05/27/15 126/60  05/23/15 128/68    Wt Readings from Last 3 Encounters:  06/19/15 174 lb (78.926 kg)  05/23/15 169 lb (76.658 kg)  04/29/15 178 lb 3.2 oz (80.831 kg)    Physical Exam  Constitutional: She is oriented to person, place, and time.  HENT:  Head: Normocephalic and atraumatic.  Mouth/Throat: Oropharynx is clear and moist. No oropharyngeal exudate.  Eyes: Conjunctivae are normal. Right eye exhibits no discharge. Left eye exhibits no discharge. No scleral icterus.  Neck: Normal range of motion. Neck supple. No JVD present. No tracheal deviation present. No thyromegaly present.  Cardiovascular: Normal rate, regular rhythm, normal heart sounds and intact distal pulses.  Exam reveals no gallop and no friction rub.   No murmur heard. Pulmonary/Chest: Effort normal and breath sounds normal. No stridor. No respiratory distress. She has no wheezes. She has no rales. She exhibits no tenderness.  Abdominal: Soft. Bowel sounds are normal. She exhibits no distension and no mass. There is no tenderness. There is no rebound and no guarding.   Musculoskeletal: Normal range of motion. She exhibits edema (1+ pitting edema located over both ankles). She exhibits no tenderness.  Lymphadenopathy:    She has no cervical adenopathy.  Neurological: She is oriented to person, place, and time.  Skin: Skin is warm, dry and intact. Rash noted. No abrasion, no bruising, no ecchymosis, no petechiae and no purpura noted. Rash is macular and papular. Rash is not nodular, not pustular, not vesicular and not urticarial. She is not diaphoretic. No erythema. No pallor.     Psychiatric: She has a normal mood and affect. Her behavior is normal. Thought content normal.    Lab Results  Component Value Date   WBC 4.3 05/27/2015   HGB 10.1* 05/27/2015   HCT 31.1* 05/27/2015   PLT 295 05/27/2015   GLUCOSE 230* 06/19/2015   CHOL 272* 02/20/2015   TRIG  95.0 02/20/2015   HDL 58.70 02/20/2015   LDLDIRECT 160.0 03/16/2013   LDLCALC 194* 02/20/2015   ALT 12 05/27/2015   AST 17 05/27/2015   NA 142 06/19/2015   K 3.8 06/19/2015   CL 106 06/19/2015   CREATININE 0.81 06/19/2015   BUN 11 06/19/2015   CO2 26 06/19/2015   TSH 6.06* 06/19/2015   HGBA1C 7.1* 06/19/2015   MICROALBUR 7.3* 10/19/2014    Dg Chest 2 View  05/24/2015  CLINICAL DATA:  Cough, shortness of breath. EXAM: CHEST  2 VIEW COMPARISON:  February 12, 2015. FINDINGS: The heart size and mediastinal contours are within normal limits. Both lungs are clear. No pneumothorax or pleural effusion is noted. Old right rib fracture is noted. Surgical clips are noted in right axillary region. Status post surgical internal fixation of proximal left humerus. IMPRESSION: No active cardiopulmonary disease. Electronically Signed   By: Marijo Conception, M.D.   On: 05/24/2015 08:30    Assessment & Plan:   Christy Harrell was seen today for rash, hypothyroidism and diabetes.  Diagnoses and all orders for this visit:  Type 2 diabetes, uncontrolled, with neuropathy ([place])- her blood sugars are adequately  well-controlled and her renal function is stable, will continue metformin. -     Basic metabolic panel; Future -     Hemoglobin A1c; Future -     metFORMIN (GLUCOPHAGE) 500 MG tablet; Take 1 tablet (500 mg total) by mouth daily.  Other specified hypothyroidism- her TSH is slightly elevated and she is symptomatic. She has been taking a total of 125 micrograms of Synthroid a day so I have increased her dose to 150 g per day. -     TSH; Future -     levothyroxine (SYNTHROID, LEVOTHROID) 150 MCG tablet; Take 1 tablet (150 mcg total) by mouth daily.  Eczema, dyshidrotic- we'll treat this with doxepin at bedtime and Lidex cream -     fluocinonide-emollient (LIDEX-E) 0.05 % cream; Apply 1 application topically 2 (two) times daily. -     doxepin (SINEQUAN) 25 MG capsule; Take 1 capsule (25 mg total) by mouth at bedtime as needed.  Localized edema- she will restart Lasix for this. -     furosemide (LASIX) 40 MG tablet; Take 1 tablet (40 mg total) by mouth daily.   I have discontinued Ms. Niedermeier's meloxicam, desonide, fentaNYL, levothyroxine, diphenhydrAMINE, and levothyroxine. I am also having her start on fluocinonide-emollient, doxepin, and levothyroxine. Additionally, I am having her maintain her Polyethyl Glycol-Propyl Glycol (SYSTANE ULTRA OP), diclofenac sodium, docusate sodium, ezetimibe, aspirin EC, polyethylene glycol powder, prochlorperazine, lubiprostone, pregabalin, potassium chloride SA, LORazepam, predniSONE, atorvastatin, AFINITOR, exemestane, everolimus, oxyCODONE, predniSONE, ketoconazole, enalapril, furosemide, and metFORMIN.  Meds ordered this encounter  Medications  . enalapril (VASOTEC) 10 MG tablet    Sig: 10 mg daily.    Refill:  2  . fluocinonide-emollient (LIDEX-E) 0.05 % cream    Sig: Apply 1 application topically 2 (two) times daily.    Dispense:  60 g    Refill:  2  . doxepin (SINEQUAN) 25 MG capsule    Sig: Take 1 capsule (25 mg total) by mouth at bedtime as needed.     Dispense:  90 capsule    Refill:  1  . furosemide (LASIX) 40 MG tablet    Sig: Take 1 tablet (40 mg total) by mouth daily.    Dispense:  30 tablet    Refill:  6  . metFORMIN (GLUCOPHAGE) 500 MG tablet  Sig: Take 1 tablet (500 mg total) by mouth daily.    Dispense:  90 tablet    Refill:  1  . levothyroxine (SYNTHROID, LEVOTHROID) 150 MCG tablet    Sig: Take 1 tablet (150 mcg total) by mouth daily.    Dispense:  90 tablet    Refill:  1     Follow-up: Return in about 4 months (around 10/17/2015).  [name], MD

## 2015-06-19 NOTE — Patient Instructions (Signed)

## 2015-06-19 NOTE — Progress Notes (Signed)
Pre visit review using our clinic review tool, if applicable. No additional management support is needed unless otherwise documented below in the visit note. 

## 2015-06-21 ENCOUNTER — Ambulatory Visit (HOSPITAL_COMMUNITY): Admission: RE | Admit: 2015-06-21 | Payer: Medicare Other | Source: Ambulatory Visit

## 2015-06-27 ENCOUNTER — Other Ambulatory Visit (HOSPITAL_BASED_OUTPATIENT_CLINIC_OR_DEPARTMENT_OTHER): Payer: Medicare Other

## 2015-06-27 ENCOUNTER — Telehealth: Payer: Self-pay | Admitting: Oncology

## 2015-06-27 ENCOUNTER — Ambulatory Visit (HOSPITAL_BASED_OUTPATIENT_CLINIC_OR_DEPARTMENT_OTHER): Payer: Medicare Other

## 2015-06-27 ENCOUNTER — Ambulatory Visit (HOSPITAL_BASED_OUTPATIENT_CLINIC_OR_DEPARTMENT_OTHER): Payer: Medicare Other | Admitting: Oncology

## 2015-06-27 VITALS — BP 143/69 | HR 77 | Temp 98.5°F | Resp 18 | Ht 60.0 in | Wt 170.9 lb

## 2015-06-27 DIAGNOSIS — C7951 Secondary malignant neoplasm of bone: Secondary | ICD-10-CM | POA: Diagnosis not present

## 2015-06-27 DIAGNOSIS — C787 Secondary malignant neoplasm of liver and intrahepatic bile duct: Secondary | ICD-10-CM

## 2015-06-27 DIAGNOSIS — C50512 Malignant neoplasm of lower-outer quadrant of left female breast: Secondary | ICD-10-CM

## 2015-06-27 DIAGNOSIS — Z79811 Long term (current) use of aromatase inhibitors: Secondary | ICD-10-CM | POA: Diagnosis not present

## 2015-06-27 DIAGNOSIS — Z5111 Encounter for antineoplastic chemotherapy: Secondary | ICD-10-CM | POA: Diagnosis not present

## 2015-06-27 LAB — COMPREHENSIVE METABOLIC PANEL (CC13)
ALBUMIN: 2.8 g/dL — AB (ref 3.5–5.0)
ANION GAP: 9 meq/L (ref 3–11)
AST: 16 U/L (ref 5–34)
Alkaline Phosphatase: 95 U/L (ref 40–150)
BILIRUBIN TOTAL: 0.36 mg/dL (ref 0.20–1.20)
BUN: 12 mg/dL (ref 7.0–26.0)
CALCIUM: 9.1 mg/dL (ref 8.4–10.4)
CO2: 26 mEq/L (ref 22–29)
CREATININE: 0.9 mg/dL (ref 0.6–1.1)
Chloride: 109 mEq/L (ref 98–109)
EGFR: 71 mL/min/{1.73_m2} — ABNORMAL LOW (ref >90)
Glucose: 139 mg/dl (ref 70–140)
Potassium: 3.8 mEq/L (ref 3.5–5.1)
Sodium: 144 mEq/L (ref 136–145)
TOTAL PROTEIN: 6.7 g/dL (ref 6.4–8.3)

## 2015-06-27 LAB — CBC WITH DIFFERENTIAL/PLATELET
BASO%: 1.3 % (ref 0.0–2.0)
Basophils Absolute: 0.1 10*3/uL (ref 0.0–0.1)
EOS ABS: 0.1 10*3/uL (ref 0.0–0.5)
EOS%: 3.4 % (ref 0.0–7.0)
HEMATOCRIT: 32.8 % — AB (ref 34.8–46.6)
HEMOGLOBIN: 10.6 g/dL — AB (ref 11.6–15.9)
LYMPH#: 1.3 10*3/uL (ref 0.9–3.3)
LYMPH%: 31 % (ref 14.0–49.7)
MCH: 24.5 pg — ABNORMAL LOW (ref 25.1–34.0)
MCHC: 32.2 g/dL (ref 31.5–36.0)
MCV: 76 fL — ABNORMAL LOW (ref 79.5–101.0)
MONO#: 0.4 10*3/uL (ref 0.1–0.9)
MONO%: 9.4 % (ref 0.0–14.0)
NEUT%: 54.9 % (ref 38.4–76.8)
NEUTROS ABS: 2.3 10*3/uL (ref 1.5–6.5)
PLATELETS: 252 10*3/uL (ref 145–400)
RBC: 4.31 10*6/uL (ref 3.70–5.45)
RDW: 17.8 % — AB (ref 11.2–14.5)
WBC: 4.2 10*3/uL (ref 3.9–10.3)

## 2015-06-27 MED ORDER — FULVESTRANT 250 MG/5ML IM SOLN
500.0000 mg | Freq: Once | INTRAMUSCULAR | Status: AC
  Administered 2015-06-27: 500 mg via INTRAMUSCULAR
  Filled 2015-06-27: qty 10

## 2015-06-27 NOTE — Progress Notes (Signed)
ID: Christy Harrell   DOB: Apr 01, 1941  MR#: [id number]  DXA#:[id number]  PCP: [name], MD GYN:  SU:  OTHER MD:  Alysia Penna, Frederik Pear, Faustino Congress  CC: Breast cancer stage IV  TREATMENT: exemestane, everolimus, fulvestrant, and denosumab  BREAST CANCER HISTORY: From the original intake note:  The patient had routine screening mammography 10/16/2010 showing a lobulated mass in the right subareolar region measuring up to 5.5 cm. The Left breast showed some central microcalcifications. Biopsy of the Right breast mass on 10/28/2010 showed an invasive ductal carcinoma, grade 2, estrogen receptor positive (Allred score 8), progesterone receptor positive (Allred score 5), with an equivocal HER-2. The Left breast area of microcalcifications was biopsied at the same time, and was read as suspicious for DCIS.  On 11/17/2010 the patient underwent Right lumpectomy and axillary lymph node dissection for what proved to be a 3 cm invasive ductal carcinoma, grade 2, with some papillary and mucinous features. One of 16 lymph nodes was involved. FISH showed no HER-2 amplification. Left breast biopsy was benign.  The patient had an Oncotype sent, with a score of 27, predicting a risk of distant recurrence after 5 years of tamoxifen in the 18% range. With this intermediate result, the decision was made not to proceed with chemotherapy, since the patient is the sole caregiver to her very elderly mother (age 74). Instead the patient proceeded to radiation treatment which was completed 03/06/2011. She started anastrozole at that point. Her subsequent history is as detailed below   INTERVAL HISTORY: Christy Harrell returns today for follow up of her estrogen receptor positive stage IV breast cancer. [name] she is doing "really well". Although she grieves for her mother, since her death Christy Harrell has had more time to herself and she has become more active in her church and with  the "sisters network", which she greatly enjoys. She is having some hot flashes from the exemestane, but denies vaginal dryness or worsening arthralgias/myalgias. She does not have mucositis or evidence of pneumonitis related to the ever [place] his treatment. She was supposed to have had a restaging MRI of the liver prior to today's visit but she lost her transportation and that has been rescheduled for next week.  REVIEW OF SYSTEMS: Christy Harrell is concerned about her weight. She has significant knee pain. It is hard for her to exercise. She is not on an effective diet, she tells me. Sometimes her ankles swell. She sleeps on 3 pillows. She says her appetite is good and food doesn't seem appealing and yet her weight continues to go up. She does have joint pains here in there which are not more intense or persistent than before. She continues to have of course significant lymphedema in the right upper extremity. This is unchanged. She tells me her blood sugars are "all right". Overall a detailed review of systems today was stable.  PAST MEDICAL HISTORY: Past Medical History  Diagnosis Date  . Club foot   . Arthritis   . Thyroid disease   . Hyperlipidemia   . Osteoporosis   . Hypertension   . GERD (gastroesophageal reflux disease)     watches diet  . Neuromuscular disorder (H[facility])     lt arm numb sometimes  . Wears glasses   . History of radiation therapy 02/22/13- 03/13/13    left proximal humerus 3500 cGy 14 sessions  . Lymphedema     right arm  . Diabetes mellitus     metformin  . Cancer ([place])  right breast  . Metastasis from malignant tumor of breast ([facility])     left humerous  . Metastasis from malignant tumor of breast [facility])     liver    PAST SURGICAL HISTORY: Past Surgical History  Procedure Laterality Date  . Cesarean section    . Thyroidectomy  2002  . Foot surgery      as a child for club foot  . Breast surgery  2012    rt lump-16 nodes-in [place]  . Cesarean section    .  Colonoscopy    . Dilation and curettage of uterus    . Humerus im nail Left 12/27/2012    Procedure: INTRAMEDULLARY (IM) NAIL HUMERAL LEFT PATHOLOGIC FRACTURE;  Surgeon: Nita Sells, MD;  Location: [facility];  Service: Orthopedics;  Laterality: Left;    FAMILY HISTORY Family History  Problem Relation Age of Onset  . Cancer Neg Hx   . Arthritis Neg Hx   . Heart disease Neg Hx   . Hyperlipidemia Neg Hx   . Hypertension Neg Hx   . Dementia Mother   . Hearing loss Mother    The patient's father died from unknown causes. The patient's mother is alive at age 83. The patient was a single child. There is no history of breast or ovarian cancer in the family to her knowledge.  GYNECOLOGIC HISTORY: Menarche age 21, menopause in her early 43s. The patient is GX P1, first pregnancy to term age 74. She did not take hormone replacement  SOCIAL HISTORY: The patient grew up in [place], attended [employer] high school, then moved to [place] where she lived about 40 years. 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 Her mother, Christy Harrell away at 100, on 08-25-2014. The patient's daughter Christy Harrell, 71, is disabled secondary to pulmonary hypertension. She can be reached at [phone]. The patient has 2 grandchildren, Christy Harrell who works at a Scientist, physiological business and is 74 years old, in general, 48, completing high school.   ADVANCED DIRECTIVES:not in place  HEALTH MAINTENANCE: Social History  Substance Use Topics  . Smoking status: Former Smoker    Quit date: 12/23/1974  . Smokeless tobacco: Never Used  . Alcohol Use: No     Colonoscopy:  PAP:    Bone density:  2012, on ibandronate chronically  Lipid panel: UTD  Allergies  Allergen Reactions  . Bydureon [Exenatide] Rash  . Gadolinium Derivatives Other (See Comments)    Pt began having chest numbness/tingling , stated her heart felt funny, had to take her to ED for EKG per RN    CAP    Current Outpatient Prescriptions  Medication Sig Dispense Refill  . AFINITOR 10 MG tablet TAKE 1 TABLET BY MOUTH ONCE DAILY 28 tablet 2  . aspirin EC 81 MG tablet Take 1 tablet (81 mg total) by mouth daily.    Marland Kitchen atorvastatin (LIPITOR) 80 MG tablet Take 1 tablet (80 mg total) by mouth daily. 30 tablet 11  . diclofenac sodium (VOLTAREN) 1 % GEL Apply 2 g topically 4 (four) times daily. 2 Tube 2  . docusate sodium (COLACE) 100 MG capsule Take 100 mg by mouth 2 (two) times daily as needed for constipation.    Marland Kitchen doxepin (SINEQUAN) 25 MG capsule Take 1 capsule (25 mg total) by mouth at bedtime as needed. 90 capsule 1  . enalapril (VASOTEC) 10 MG tablet 10 mg daily.  2  . everolimus (AFINITOR) 5 MG tablet Take  1 tablet (5 mg total) by mouth daily. 90 tablet 4  . exemestane (AROMASIN) 25 MG tablet Take 1 tablet (25 mg total) by mouth daily. 30 tablet 2  . ezetimibe (ZETIA) 10 MG tablet Take 1 tablet (10 mg total) by mouth daily. 90 tablet 3  . fluocinonide-emollient (LIDEX-E) 0.05 % cream Apply 1 application topically 2 (two) times daily. 60 g 2  . furosemide (LASIX) 40 MG tablet Take 1 tablet (40 mg total) by mouth daily. 30 tablet 6  . ketoconazole (NIZORAL) 2 % cream Apply 1 application topically daily. 60 g 2  . levothyroxine (SYNTHROID, LEVOTHROID) 150 MCG tablet Take 1 tablet (150 mcg total) by mouth daily. 90 tablet 1  . LORazepam (ATIVAN) 0.5 MG tablet Take 15 minutes before MRI test; may repeat x 1 10 tablet 0  . lubiprostone (AMITIZA) 24 MCG capsule Take 1 capsule (24 mcg total) by mouth 2 (two) times daily with a meal. 180 capsule 1  . metFORMIN (GLUCOPHAGE) 500 MG tablet Take 1 tablet (500 mg total) by mouth daily. 90 tablet 1  . oxyCODONE (OXY IR/ROXICODONE) 5 MG immediate release tablet Take 1 tablet (5 mg total) by mouth every 4 (four) hours as needed for severe pain. Take 1-2 tablets every 4-6 hours as needed for pain - fill on or after 04/09/15 100 tablet 0  . Polyethyl  Glycol-Propyl Glycol (SYSTANE ULTRA OP) Place 1 drop into both eyes daily.    . polyethylene glycol powder (GLYCOLAX/MIRALAX) powder Take 255 g (1 Container total) by mouth daily. 255 g 3  . potassium chloride SA (K-DUR,KLOR-CON) 20 MEQ tablet Take 1 tablet (20 mEq total) by mouth 2 (two) times daily. 180 tablet 1  . predniSONE (DELTASONE) 5 MG tablet Take 4 tablets with food 30 minutes before MRI. 12 tablet 0  . predniSONE (DELTASONE) 50 MG tablet Take 13 hours, 7 hours, and 1 hour prior to CT scan. 3 tablet 0  . pregabalin (LYRICA) 75 MG capsule Take 1 capsule (75 mg total) by mouth 2 (two) times daily. Not taking 60 capsule 5  . prochlorperazine (COMPAZINE) 5 MG tablet Take 1 tablet (5 mg total) by mouth every 6 (six) hours as needed for nausea or vomiting. 90 tablet 6   No current facility-administered medications for this visit.   PHYSICAL EXAM: Elderly African American woman in no acute distress Filed Vitals:   06/27/15 1152  BP: 143/69  Pulse: 77  Temp: 98.5 F (36.9 C)  Resp: 18   Body mass index is 33.38 kg/(m^2).    ECOG FS: 2 Filed Weights   06/27/15 1152  Weight: 170 lb 14.4 oz (77.52 kg)   Sclerae unicteric, EOMs intact Oropharynx clear and moist No cervical or supraclavicular adenopathy Lungs no rales or rhonchi Heart regular rate and rhythm Abd soft, obese, nontender, positive bowel sounds MSK scoliosis but no focal spinal tenderness, grade 3 right upper extremity lymphedema as previously noted Neuro: nonfocal, well oriented, positive affect Breasts: The right breast is somewhat edematous and slightly darker than the left, status post lumpectomy and radiation. There is no evidence of local recurrence. The right axilla is benign. The left breast is unremarkable.   LAB RESULTS: Lab Results  Component Value Date   WBC 4.2 06/27/2015   NEUTROABS 2.3 06/27/2015   HGB 10.6* 06/27/2015   HCT 32.8* 06/27/2015   MCV 76.0* 06/27/2015   PLT 252 06/27/2015       Chemistry      Component Value Date/Time  NA 142 06/19/2015 1156   NA 142 05/27/2015 1221   K 3.8 06/19/2015 1156   K 3.6 05/27/2015 1221   CL 106 06/19/2015 1156   CL 105 01/23/2013 1337   CO2 26 06/19/2015 1156   CO2 24 05/27/2015 1221   BUN 11 06/19/2015 1156   BUN 11.4 05/27/2015 1221   CREATININE 0.81 06/19/2015 1156   CREATININE 0.8 05/27/2015 1221      Component Value Date/Time   CALCIUM 9.1 06/19/2015 1156   CALCIUM 8.7 05/27/2015 1221   ALKPHOS 96 05/27/2015 1221   ALKPHOS 64 02/20/2015 0759   AST 17 05/27/2015 1221   AST 18 02/20/2015 0759   ALT 12 05/27/2015 1221   ALT 10 02/20/2015 0759   BILITOT 0.40 05/27/2015 1221   BILITOT 0.4 02/20/2015 0759       Lab Results  Component Value Date   LABCA2 24 11/19/2011    STUDIES: No results found.   ASSESSMENT: 74 y.o.  [place] woman with stage IV [bone only] breast cancer as of May 2014  (1)  status post right lumpectomy and axillary lymph node dissection 11/17/2010 for a pT2 pN1, stage IIB invasive ductal carcinoma, grade 2, estrogen and progesterone receptor positive, HER-2 negative,   (2) Oncotype DX recurrence score of 27 predicting a risk of distant recurrence of 18% with 5 years of tamoxifen (intermediate score);   (3)  status post radiation completed July of 2012,   (4) started anastrozole July 2012, discontinued June 2014 with evidence of metastatic spread to bone  (5) Chronic lymphedema in the right upper extremity.  (6) pathologic fracture of the left humerus along apparent lytic lesion, with no other bone lesions per bone scan 12/12/2012  (7) on 12/27/2012 underwent #1 intramedullary nail left pathologic proximal humerus fracture  #2 left shoulder rotator cuff repair  #3 left shoulder open bone biopsy with pathology showing metastatic breast adenocarcinoma, estrogen receptor 100% positive, progesterone receptor and HER-2 negative, with an MIB-1 of 26%. PET scan 01/13/2013 showed no other  areas of disease   (8) status post 35 Gy to the left proximal humerus completed 03/13/2013(  (9) fulvestrant, started on 01/23/2013, ongoing  (10) zolendronic acid given monthly, first dose 01/23/2013; changed to denosumab as of August 2015 because of access problems  (11) multiple liver lesions noted on scans April 2015   (12) letrozole added May 2015, stopped 04/18/14 because of progression in her liver noted on a chest CT performed on 03/14/14  (13) everolimus and exemestane started 04/19/14  (a) MRI of the abdomen 02/25/2015 shows continuing response   PLAN:  Telina looks the best I have seen her in quite some time. She is tolerating her antiestrogen therapy well and the plan is to continue as currently until there is evidence of disease response. She is able to obtain the drugs had a good price.  We were supposed to have restaged her prior to this visit but for social reasons this did not occur. She tells me she will make sure to arrange for a driver to take her to her next radiology appointment the first week in December. She will then see [place] a week or 2 later.  If the scan is favorable, the plan is to continue her everolimus and exemestane daily, and Denosumab and fulvestrant monthly. If we have evidence of disease progression we will consider capecitabine, CMF, or eribulin. At some point we could try to return to anti-estrogens, perhaps anastrozole with Palbociclib.  She knows to call  for any problems that may develop before her next visit here. Chauncey Cruel, MD  06/27/2015

## 2015-06-27 NOTE — Telephone Encounter (Signed)
Called and left a message appointments and that i was mailing her avs to her as well   anne

## 2015-07-12 ENCOUNTER — Other Ambulatory Visit: Payer: Self-pay | Admitting: Nurse Practitioner

## 2015-07-12 ENCOUNTER — Ambulatory Visit (HOSPITAL_COMMUNITY)
Admission: RE | Admit: 2015-07-12 | Discharge: 2015-07-12 | Disposition: A | Discharge status: Home / Self Care | Payer: Medicare Other | Source: Ambulatory Visit | Attending: Nurse Practitioner | Admitting: Nurse Practitioner

## 2015-07-12 DIAGNOSIS — C787 Secondary malignant neoplasm of liver and intrahepatic bile duct: Secondary | ICD-10-CM | POA: Diagnosis not present

## 2015-07-12 DIAGNOSIS — Z853 Personal history of malignant neoplasm of breast: Secondary | ICD-10-CM | POA: Diagnosis present

## 2015-07-18 ENCOUNTER — Other Ambulatory Visit: Payer: Self-pay | Admitting: Oncology

## 2015-07-25 ENCOUNTER — Ambulatory Visit (HOSPITAL_BASED_OUTPATIENT_CLINIC_OR_DEPARTMENT_OTHER): Payer: Medicare Other

## 2015-07-25 ENCOUNTER — Other Ambulatory Visit: Payer: Self-pay | Admitting: *Deleted

## 2015-07-25 ENCOUNTER — Telehealth: Payer: Self-pay | Admitting: Oncology

## 2015-07-25 ENCOUNTER — Ambulatory Visit (HOSPITAL_BASED_OUTPATIENT_CLINIC_OR_DEPARTMENT_OTHER): Payer: Medicare Other | Admitting: Nurse Practitioner

## 2015-07-25 ENCOUNTER — Encounter: Payer: Self-pay | Admitting: Nurse Practitioner

## 2015-07-25 ENCOUNTER — Other Ambulatory Visit (HOSPITAL_BASED_OUTPATIENT_CLINIC_OR_DEPARTMENT_OTHER): Payer: Medicare Other

## 2015-07-25 DIAGNOSIS — I890 Lymphedema, not elsewhere classified: Secondary | ICD-10-CM

## 2015-07-25 DIAGNOSIS — C50512 Malignant neoplasm of lower-outer quadrant of left female breast: Secondary | ICD-10-CM

## 2015-07-25 DIAGNOSIS — C787 Secondary malignant neoplasm of liver and intrahepatic bile duct: Secondary | ICD-10-CM

## 2015-07-25 DIAGNOSIS — C7951 Secondary malignant neoplasm of bone: Secondary | ICD-10-CM

## 2015-07-25 DIAGNOSIS — M4692 Unspecified inflammatory spondylopathy, cervical region: Secondary | ICD-10-CM

## 2015-07-25 DIAGNOSIS — M5030 Other cervical disc degeneration, unspecified cervical region: Secondary | ICD-10-CM

## 2015-07-25 DIAGNOSIS — M5412 Radiculopathy, cervical region: Secondary | ICD-10-CM

## 2015-07-25 DIAGNOSIS — Z5111 Encounter for antineoplastic chemotherapy: Secondary | ICD-10-CM

## 2015-07-25 DIAGNOSIS — G893 Neoplasm related pain (acute) (chronic): Secondary | ICD-10-CM

## 2015-07-25 DIAGNOSIS — R079 Chest pain, unspecified: Secondary | ICD-10-CM

## 2015-07-25 DIAGNOSIS — Z79811 Long term (current) use of aromatase inhibitors: Secondary | ICD-10-CM

## 2015-07-25 LAB — CBC WITH DIFFERENTIAL/PLATELET
BASO%: 0.5 % (ref 0.0–2.0)
BASOS ABS: 0 10*3/uL (ref 0.0–0.1)
EOS ABS: 0.1 10*3/uL (ref 0.0–0.5)
EOS%: 2.5 % (ref 0.0–7.0)
HCT: 34.2 % — ABNORMAL LOW (ref 34.8–46.6)
HEMOGLOBIN: 10.8 g/dL — AB (ref 11.6–15.9)
LYMPH%: 29.3 % (ref 14.0–49.7)
MCH: 25.2 pg (ref 25.1–34.0)
MCHC: 31.6 g/dL (ref 31.5–36.0)
MCV: 79.9 fL (ref 79.5–101.0)
MONO#: 0.5 10*3/uL (ref 0.1–0.9)
MONO%: 8.9 % (ref 0.0–14.0)
NEUT#: 3.3 10*3/uL (ref 1.5–6.5)
NEUT%: 58.8 % (ref 38.4–76.8)
PLATELETS: 265 10*3/uL (ref 145–400)
RBC: 4.28 10*6/uL (ref 3.70–5.45)
RDW: 17.5 % — AB (ref 11.2–14.5)
WBC: 5.6 10*3/uL (ref 3.9–10.3)
lymph#: 1.6 10*3/uL (ref 0.9–3.3)

## 2015-07-25 LAB — COMPREHENSIVE METABOLIC PANEL
ALBUMIN: 3 g/dL — AB (ref 3.5–5.0)
ALK PHOS: 91 U/L (ref 40–150)
ALT: 9 U/L (ref 0–55)
ANION GAP: 8 meq/L (ref 3–11)
AST: 17 U/L (ref 5–34)
BUN: 10.1 mg/dL (ref 7.0–26.0)
CALCIUM: 9 mg/dL (ref 8.4–10.4)
CO2: 27 mEq/L (ref 22–29)
Chloride: 107 mEq/L (ref 98–109)
Creatinine: 0.9 mg/dL (ref 0.6–1.1)
EGFR: 72 mL/min/{1.73_m2} — AB (ref >90)
GLUCOSE: 113 mg/dL (ref 70–140)
POTASSIUM: 4.3 meq/L (ref 3.5–5.1)
SODIUM: 142 meq/L (ref 136–145)
Total Bilirubin: 0.53 mg/dL (ref 0.20–1.20)
Total Protein: 7.1 g/dL (ref 6.4–8.3)

## 2015-07-25 MED ORDER — DENOSUMAB 120 MG/1.7ML ~~LOC~~ SOLN
120.0000 mg | Freq: Once | SUBCUTANEOUS | Status: AC
  Administered 2015-07-25: 120 mg via SUBCUTANEOUS
  Filled 2015-07-25: qty 1.7

## 2015-07-25 MED ORDER — OXYCODONE HCL 5 MG PO TABS: 5.0000 mg | ORAL_TABLET | ORAL | Status: DC | PRN

## 2015-07-25 MED ORDER — FULVESTRANT 250 MG/5ML IM SOLN
500.0000 mg | Freq: Once | INTRAMUSCULAR | Status: AC
  Administered 2015-07-25: 500 mg via INTRAMUSCULAR
  Filled 2015-07-25: qty 10

## 2015-07-25 NOTE — Progress Notes (Signed)
ID: Christy Harrell   DOB: [birth year]  MR#: [id number]  WUJ#:[id number]  PCP: [name], MD GYN:  SU:  OTHER MD:  Alysia Penna, Frederik Pear, Faustino Congress  CC: Breast cancer stage IV  TREATMENT: exemestane, everolimus, fulvestrant, and denosumab  BREAST CANCER HISTORY: From the original intake note:  The patient had routine screening mammography 10/16/2010 showing a lobulated mass Christy the right subareolar region measuring up to 5.5 cm. The Left breast showed some central microcalcifications. Biopsy of the Right breast mass on 10/28/2010 showed an invasive ductal carcinoma, grade 2, estrogen receptor positive (Allred score 8), progesterone receptor positive (Allred score 5), with an equivocal HER-2. The Left breast area of microcalcifications was biopsied at the same time, and was read as suspicious for DCIS.  On 11/17/2010 the patient underwent Right lumpectomy and axillary lymph node dissection for what proved to be a 3 cm invasive ductal carcinoma, grade 2, with some papillary and mucinous features. One of 16 lymph nodes was involved. FISH showed no HER-2 amplification. Left breast biopsy was benign.  The patient had an Oncotype sent, with a score of 27, predicting a risk of distant recurrence after 5 years of tamoxifen Christy the 18% range. With this intermediate result, the decision was made not to proceed with chemotherapy, since the patient is the sole caregiver to her very elderly mother. Instead the patient proceeded to radiation treatment which was completed 03/06/2011. She started anastrozole at that point. Her subsequent history is as detailed below   INTERVAL HISTORY: Christy Harrell returns today for follow up of her estrogen receptor positive stage IV breast cancer. She continues on exemestane and everolimus and tolerates both drugs well with few complaints. She has some hot flashes, but denies vaginal dryness, unusual arthralgias/myalgias. She has  no mouth sores, rashes, diarrhea, or pneumonitis. The interval history is remarakble for a repeat liver MRI. She is here to review the results.   REVIEW OF SYSTEMS: Christy Harrell denies fevers or chills. Her nausea is minimal. Her appetite is fair and she is gaining some of her weight back. She ambulates with a cane and is generally steady, but she has fallen twice Christy the past month. She is had a steroid injection to her left shoulder and is having to attend PT as a result of one of the falls. It is still somewhat tender. She continues to have significant lymphedema to the right upper arm. She is not wearing a sleeve today, because it will not fit Christy her winter coat. A detailed review of systems is otherwise stable.  PAST MEDICAL HISTORY: Past Medical History  Diagnosis Date  . Club foot   . Arthritis   . Thyroid disease   . Hyperlipidemia   . Osteoporosis   . Hypertension   . GERD (gastroesophageal reflux disease)     watches diet  . Neuromuscular disorder (HCC)     lt arm numb sometimes  . Wears glasses   . History of radiation therapy 02/22/13- 03/13/13    left proximal humerus 3500 cGy 14 sessions  . Lymphedema     right arm  . Diabetes mellitus     metformin  . Cancer [facility])     right breast  . Metastasis from malignant tumor of breast ([place])     left humerous  . Metastasis from malignant tumor of breast [facility])     liver    PAST SURGICAL HISTORY: Past Surgical History  Procedure Laterality Date  . Cesarean section    .  Thyroidectomy  2002  . Foot surgery      as a child for club foot  . Breast surgery  2012    rt lump-16 nodes-[place]  . Cesarean section    . Colonoscopy    . Dilation and curettage of uterus    . Humerus im nail Left 12/27/2012    Procedure: INTRAMEDULLARY (IM) NAIL HUMERAL LEFT PATHOLOGIC FRACTURE;  Surgeon: Nita Sells, MD;  Location: [facility];  Service: Orthopedics;  Laterality: Left;    FAMILY HISTORY Family History   Problem Relation Age of Onset  . Cancer Neg Hx   . Arthritis Neg Hx   . Heart disease Neg Hx   . Hyperlipidemia Neg Hx   . Hypertension Neg Hx   . Dementia Mother   . Hearing loss Mother    The patient's father died from unknown causes. The patient's mother is alive at age 23. The patient was a single child. There is no history of breast or ovarian cancer Christy the family to her knowledge.  GYNECOLOGIC HISTORY: Menarche age 5, menopause Christy her early 27s. The patient is GX P1, first pregnancy to term age 48. She did not take hormone replacement  SOCIAL HISTORY: The patient grew up Christ[place], attended [employer], then moved to [place] where she lived about 40 years. 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 Her mother, Christy Harrell away at 100, on 09-03-14. The patient's daughter Christy Harrell, 71, is disabled secondary to pulmonary hypertension. She can be reached at [phone]. The patient has 2 grandchildren, Christy Harrell who works at a Scientist, physiological business and is 74 years old, Christy Harrell, 51, completing high school.   ADVANCED DIRECTIVES:not Christy place  HEALTH MAINTENANCE: Social History  Substance Use Topics  . Smoking status: Former Smoker    Quit date: 12/23/1974  . Smokeless tobacco: Never Used  . Alcohol Use: No     Colonoscopy:  PAP:    Bone density:  2012, on ibandronate chronically  Lipid panel: UTD  Allergies  Allergen Reactions  . Bydureon [Exenatide] Rash  . Gadolinium Derivatives Other (See Comments)    Pt began having chest numbness/tingling , stated her heart felt funny, had to take her to ED for EKG per RN   CAP    Current Outpatient Prescriptions  Medication Sig Dispense Refill  . AFINITOR 10 MG tablet TAKE 1 TABLET BY MOUTH ONCE DAILY 28 tablet 2  . aspirin EC 81 MG tablet Take 1 tablet (81 mg total) by mouth daily.    Marland Kitchen atorvastatin (LIPITOR) 80 MG tablet Take 1 tablet (80 mg total) by mouth daily. 30 tablet  11  . diclofenac sodium (VOLTAREN) 1 % GEL Apply 2 g topically 4 (four) times daily. 2 Tube 2  . docusate sodium (COLACE) 100 MG capsule Take 100 mg by mouth 2 (two) times daily as needed for constipation.    . enalapril (VASOTEC) 10 MG tablet 10 mg daily.  2  . exemestane (AROMASIN) 25 MG tablet Take 1 tablet (25 mg total) by mouth daily. 30 tablet 2  . ezetimibe (ZETIA) 10 MG tablet Take 1 tablet (10 mg total) by mouth daily. 90 tablet 3  . fentaNYL (DURAGESIC - DOSED MCG/HR) 25 MCG/HR patch Place 25 mcg onto the skin every 3 (three) days.    . fluocinonide-emollient (LIDEX-E) 0.05 % cream Apply 1 application topically 2 (two) times daily. 60 g 2  . furosemide (LASIX)  40 MG tablet Take 1 tablet (40 mg total) by mouth daily. 30 tablet 6  . levothyroxine (SYNTHROID, LEVOTHROID) 150 MCG tablet Take 1 tablet (150 mcg total) by mouth daily. 90 tablet 1  . lubiprostone (AMITIZA) 24 MCG capsule Take 1 capsule (24 mcg total) by mouth 2 (two) times daily with a meal. 180 capsule 1  . metFORMIN (GLUCOPHAGE) 500 MG tablet Take 1 tablet (500 mg total) by mouth daily. 90 tablet 1  . Polyethyl Glycol-Propyl Glycol (SYSTANE ULTRA OP) Place 1 drop into both eyes daily.    . potassium chloride SA (K-DUR,KLOR-CON) 20 MEQ tablet Take 1 tablet (20 mEq total) by mouth 2 (two) times daily. 180 tablet 1  . pregabalin (LYRICA) 75 MG capsule Take 1 capsule (75 mg total) by mouth 2 (two) times daily. Not taking 60 capsule 5  . doxepin (SINEQUAN) 25 MG capsule Take 1 capsule (25 mg total) by mouth at bedtime as needed. (Patient not taking: Reported on 07/25/2015) 90 capsule 1  . ketoconazole (NIZORAL) 2 % cream Apply 1 application topically daily. (Patient not taking: Reported on 07/25/2015) 60 g 2  . LORazepam (ATIVAN) 0.5 MG tablet Take 15 minutes before MRI test; may repeat x 1 (Patient not taking: Reported on 07/25/2015) 10 tablet 0  . oxyCODONE (OXY IR/ROXICODONE) 5 MG immediate release tablet Take 1 tablet (5 mg  total) by mouth every 4 (four) hours as needed for severe pain. Take 1-2 tablets every 4-6 hours as needed for pain - fill on or after 04/09/15 100 tablet 0  . polyethylene glycol powder (GLYCOLAX/MIRALAX) powder Take 255 g (1 Container total) by mouth daily. (Patient not taking: Reported on 07/25/2015) 255 g 3  . predniSONE (DELTASONE) 5 MG tablet Take 4 tablets with food 30 minutes before MRI. (Patient not taking: Reported on 07/25/2015) 12 tablet 0  . predniSONE (DELTASONE) 50 MG tablet Take 13 hours, 7 hours, and 1 hour prior to CT scan. (Patient not taking: Reported on 07/25/2015) 3 tablet 0  . prochlorperazine (COMPAZINE) 5 MG tablet Take 1 tablet (5 mg total) by mouth every 6 (six) hours as needed for nausea or vomiting. (Patient not taking: Reported on 07/25/2015) 90 tablet 6   No current facility-administered medications for this visit.   Facility-Administered Medications Ordered Christy Other Visits  Medication Dose Route Frequency Provider Last Rate Last Dose  . denosumab (XGEVA) injection 120 mg  120 mg Subcutaneous Once Chauncey Cruel, MD      . fulvestrant (FASLODEX) injection 500 mg  500 mg Intramuscular Once Chauncey Cruel, MD       PHYSICAL EXAM: Elderly African American woman Christy no acute distress There were no vitals filed for this visit. There is no weight on file to calculate BMI.    ECOG FS: 2 There were no vitals filed for this visit.   Skin: warm, dry  HEENT: sclerae anicteric, conjunctivae pink, oropharynx clear. No thrush or mucositis.  Lymph Nodes: No cervical or supraclavicular lymphadenopathy  Lungs: clear to auscultation bilaterally, no rales, wheezes, or rhonci  Heart: regular rate and rhythm  Abdomen: round, soft, non tender, positive bowel sounds  Musculoskeletal: No focal spinal tenderness, grade 2 right upper extremity lymphedema  Neuro: non focal, well oriented, positive affect  Breasts: right breast status post lumpectomy and radiation, swollen from  lymphedema, no evidence of recurrent disease. Right axilla benign. Left breast unremarkable.  LAB RESULTS: Lab Results  Component Value Date   WBC 5.6 07/25/2015   NEUTROABS  3.3 07/25/2015   HGB 10.8* 07/25/2015   HCT 34.2* 07/25/2015   MCV 79.9 07/25/2015   PLT 265 07/25/2015      Chemistry      Component Value Date/Time   NA 142 07/25/2015 1252   NA 142 06/19/2015 1156   K 4.3 07/25/2015 1252   K 3.8 06/19/2015 1156   CL 106 06/19/2015 1156   CL 105 01/23/2013 1337   CO2 27 07/25/2015 1252   CO2 26 06/19/2015 1156   BUN 10.1 07/25/2015 1252   BUN 11 06/19/2015 1156   CREATININE 0.9 07/25/2015 1252   CREATININE 0.81 06/19/2015 1156      Component Value Date/Time   CALCIUM 9.0 07/25/2015 1252   CALCIUM 9.1 06/19/2015 1156   ALKPHOS 91 07/25/2015 1252   ALKPHOS 64 02/20/2015 0759   AST 17 07/25/2015 1252   AST 18 02/20/2015 0759   ALT 9 07/25/2015 1252   ALT 10 02/20/2015 0759   BILITOT 0.53 07/25/2015 1252   BILITOT 0.4 02/20/2015 0759       Lab Results  Component Value Date   LABCA2 24 11/19/2011    STUDIES: Mr Liver Wo Contrast  07/12/2015  CLINICAL DATA:  Subsequent evaluation of a 74 year old female with history of breast cancer with known liver metastasis. Followup evaluation. EXAM: MRI ABDOMEN WITHOUT CONTRAST TECHNIQUE: Multiplanar multisequence MR imaging was performed without the administration of intravenous contrast. COMPARISON:  MRI of the abdomen 02/25/2015. FINDINGS: Comment: Study is significantly limited by lack of IV gadolinium for assessment of visceral and/or vascular lesions. Lower chest:  Unremarkable. Hepatobiliary: As done on the prior study, hepatic lesions on today's examination were all measured from the axial T2 respiratory triggered fat saturated images (series 6 (series 4 was not utilized secondary to excessive respiratory motion)). These lesions all appear essentially unchanged compared to the prior examination. Specific examples  include the dominant lesion Christy segment 2 (image 21 of series 6), which currently measures 6.1 x 4.2 cm (previously 6.1 x 4.3 cm), and the other a large lesion Christy the right lobe of the liver between segments 5 and 6 (image 26 of series 6) which currently measures 4.0 x 3.2 cm (previously 3.8 x 3.4 cm). No new hepatic lesions are otherwise noted. No intra or extrahepatic biliary ductal dilatation. Gallbladder is largely decompressed, but otherwise unremarkable Christy appearance. Pancreas: No pancreatic mass. No pancreatic ductal dilatation. No pancreatic or peripancreatic fluid or inflammatory changes. Spleen: Unremarkable. Adrenals/Urinary Tract: Sub cm lesion Christy the medial aspect of the interpolar region of the left kidney is T1 hypointense and T2 hyperintense, similar to the prior study, incompletely characterized on today's noncontrast examination but likely a small cyst. Right kidney bilateral adrenal glands are normal Christy appearance. No hydroureteronephrosis Christy the visualized portions of the abdomen. Stomach/Bowel: Visualized portions are unremarkable. Vascular/Lymphatic: No definite aneurysm Christy the visualized abdominal vasculature. No lymphadenopathy noted Christy the abdomen. Other: No significant volume of ascites Christy the visualized peritoneal cavity. Musculoskeletal: Postoperative changes Christy the right breast with architectural distortion and chronic postoperative fluid collection, similar to the prior examination, presumably a small seroma. Healed fracture of the posterior right eighth rib again noted. IMPRESSION: 1. Previously noted hepatic metastatic disease is stable, as above. No new hepatic lesions are noted. 2. Additional incidental findings, as above, similar prior studies. Electronically Signed   By: Vinnie Langton M.D.   On: 07/12/2015 14:44     ASSESSMENT: 74 y.o.  [place] woman with stage IV [bone only] breast cancer  as of May 2014  (1)  status post right lumpectomy and axillary lymph node  dissection 11/17/2010 for a pT2 pN1, stage IIB invasive ductal carcinoma, grade 2, estrogen and progesterone receptor positive, HER-2 negative,   (2) Oncotype DX recurrence score of 27 predicting a risk of distant recurrence of 18% with 5 years of tamoxifen (intermediate score);   (3)  status post radiation completed July of 2012,   (4) started anastrozole July 2012, discontinued June 2014 with evidence of metastatic spread to bone  (5) Chronic lymphedema Christy the right upper extremity.  (6) pathologic fracture of the left humerus along apparent lytic lesion, with no other bone lesions per bone scan 12/12/2012  (7) on 12/27/2012 underwent #1 intramedullary nail left pathologic proximal humerus fracture  #2 left shoulder rotator cuff repair  #3 left shoulder open bone biopsy with pathology showing metastatic breast adenocarcinoma, estrogen receptor 100% positive, progesterone receptor and HER-2 negative, with an MIB-1 of 26%. PET scan 01/13/2013 showed no other areas of disease   (8) status post 35 Gy to the left proximal humerus completed 03/13/2013(  (9) fulvestrant, started on 01/23/2013, ongoing  (10) zolendronic acid given monthly, first dose 01/23/2013; changed to denosumab as of August 2015 because of access problems  (11) multiple liver lesions noted on scans April 2015   (12) letrozole added May 2015, stopped 04/18/14 because of progression Christy her liver noted on a chest CT performed on 03/14/14  (13) everolimus and exemestane started 04/19/14  (a) MRI of the abdomen 07/12/15 showed stable disease   PLAN:  Christy Harrell is doing well as far as her metastatic breast cancer. She has been on exemestane and everolimus with no evidence of progressing disease, just a stable appearance of her current lesions according to an MRI earlier this month. She tolerates the drugs well and will continue them indefinitely or until evidence of progression. The labs were reviewed Christy detail and were stable.    She is due for her next fulvestrant and denosumab injection today.   Christy Harrell will return Christy 2 months for follow up with Dr. [name]. She understands and agrees with this plan. She knows the goal of treatment Christy her case is control. She has been encouraged to call with any issues that might arise before her next visit here.  Total time spent Christy this appointment was 25 minutes, with greater than 50% of the time spent face to face with the patient.    Laurie Panda, NP  07/25/2015

## 2015-07-25 NOTE — Telephone Encounter (Signed)
Gave patient appt and printed out AVS/calendar.

## 2015-07-29 ENCOUNTER — Telehealth: Payer: Self-pay | Admitting: Cardiology

## 2015-07-29 ENCOUNTER — Other Ambulatory Visit: Payer: Medicare Other

## 2015-07-29 NOTE — Telephone Encounter (Signed)
See 12/19 phone note °

## 2015-07-29 NOTE — Telephone Encounter (Signed)
Spoke with with Christy Harrell to schedule the Cardiac Ct that was cancel.  She stated that she wanted to wait til January to schedule the test. I told her I will call her back 2nd week of January/saf

## 2015-08-09 ENCOUNTER — Ambulatory Visit: Payer: Medicare Other | Admitting: Podiatry

## 2015-08-22 ENCOUNTER — Other Ambulatory Visit: Payer: Medicare Other

## 2015-08-22 ENCOUNTER — Ambulatory Visit: Payer: Medicare Other

## 2015-08-22 ENCOUNTER — Telehealth: Payer: Self-pay | Admitting: *Deleted

## 2015-08-22 NOTE — Telephone Encounter (Signed)
Christy Harrell called office to reschedule lab and injection appointments due to transportation cancelled due to inclement weather.  Rescheduled to 08/27/15.  Patient aware of date and time.

## 2015-08-26 ENCOUNTER — Ambulatory Visit: Payer: Medicare Other | Attending: Orthopedic Surgery

## 2015-08-26 DIAGNOSIS — M25512 Pain in left shoulder: Secondary | ICD-10-CM | POA: Insufficient documentation

## 2015-08-26 DIAGNOSIS — M75102 Unspecified rotator cuff tear or rupture of left shoulder, not specified as traumatic: Secondary | ICD-10-CM

## 2015-08-26 DIAGNOSIS — M12812 Other specific arthropathies, not elsewhere classified, left shoulder: Secondary | ICD-10-CM

## 2015-08-26 DIAGNOSIS — Z9181 History of falling: Secondary | ICD-10-CM | POA: Insufficient documentation

## 2015-08-26 DIAGNOSIS — Z7409 Other reduced mobility: Secondary | ICD-10-CM | POA: Insufficient documentation

## 2015-08-26 DIAGNOSIS — R29898 Other symptoms and signs involving the musculoskeletal system: Secondary | ICD-10-CM | POA: Insufficient documentation

## 2015-08-26 DIAGNOSIS — M12512 Traumatic arthropathy, left shoulder: Secondary | ICD-10-CM | POA: Diagnosis not present

## 2015-08-26 NOTE — Therapy (Addendum)
[place], [place], [address] Phone: [phone]   Fax:  [phone]  Physical Therapy Evaluation  Patient Details  Name: Christy Harrell MRN: [id number] Date of Birth: 07-27-1941 Referring Provider: Tania Ade  Encounter Date: 08/26/2015      PT End of Session - 08/26/15 1122    Visit Number 1   Number of Visits 20   Date for PT Re-Evaluation 10/20/15   PT Start Time [id number]   PT Stop Time 1100   PT Time Calculation (min) 45 min   Activity Tolerance Patient tolerated treatment well   Behavior During Therapy [facility] for tasks assessed/performed      Past Medical History  Diagnosis Date  . Club foot   . Arthritis   . Thyroid disease   . Hyperlipidemia   . Osteoporosis   . Hypertension   . GERD (gastroesophageal reflux disease)     watches diet  . Neuromuscular disorder (HCC)     lt arm numb sometimes  . Wears glasses   . History of radiation therapy 02/22/13- 03/13/13    left proximal humerus 3500 cGy 14 sessions  . Lymphedema     right arm  . Diabetes mellitus     metformin  . Cancer [facility])     right breast  . Metastasis from malignant tumor of breast ([place])     left humerous  . Metastasis from malignant tumor of breast [facility])     liver    Past Surgical History  Procedure Laterality Date  . Cesarean section    . Thyroidectomy  2002  . Foot surgery      as a child for club foot  . Breast surgery  2012    rt lump-16 nodes-in [place]  . Cesarean section    . Colonoscopy    . Dilation and curettage of uterus    . Humerus im nail Left 12/27/2012    Procedure: INTRAMEDULLARY (IM) NAIL HUMERAL LEFT PATHOLOGIC FRACTURE;  Surgeon: Nita Sells, MD;  Location: [place];  Service: Orthopedics;  Laterality: Left;    There were no vitals filed for this visit.  Visit Diagnosis:  Rotator cuff tear arthropathy, left - Plan: PT plan of care cert/re-cert  Pain in joint of  left shoulder - Plan: PT plan of care cert/re-cert  Weakness of left arm - Plan: PT plan of care cert/re-cert  Impaired mobility and ADLs - Plan: PT plan of care cert/re-cert  Patient had 2 or more falls in past year - Plan: PT plan of care cert/re-cert      Subjective Assessment - 08/26/15 1024    Subjective On 12/27/2012 pt underwent L RTC repair and humeral fracture with intramedullary nail. Pt has had chronic L shoulder pain since but progressively has worsened .  Pt has had multiple falls as well, which could contribute to worsening symptoms .    Pertinent History metastatic Breast Cancer , R lymphedema   Limitations Reading;Lifting;House hold activities;Other (comment)  reaching, dressing, bathing, grooming    Patient Stated Goals household chores, overhead reaching, grooming, dressing without pain.    Currently in Pain? Yes   Pain Score 8    Pain Location Shoulder   Pain Orientation Left   Pain Descriptors / Indicators Aching;Sharp   Pain Type Chronic pain   Pain Onset More than a month ago   Aggravating Factors  overhead reaching    Pain Relieving Factors using topical: alcohol . Use ice and  Karsten Ro PT Assessment - 08/26/15 1031    Assessment   Medical Diagnosis L SH pain    Referring Provider Tania Ade   Onset Date/Surgical Date 12/21/12   Hand Dominance Right   Next MD Visit 09/2015   Prior Therapy 2014    Precautions   Precautions Other (comment)   Precaution Comments METASTATIC CANCER   Restrictions   Weight Bearing Restrictions No   Balance Screen   Has the patient fallen in the past 6 months Yes   How many times? 2   Has the patient had a decrease in activity level because of a fear of falling?  No   Is the patient reluctant to leave their home because of a fear of falling?  No   Home Ecologist residence   Prior Function   Level of Independence Independent   Cognition   Overall Cognitive Status  Within Functional Limits for tasks assessed   Observation/Other Assessments   Focus on Therapeutic Outcomes (FOTO)  59% limited    Posture/Postural Control   Posture/Postural Control Postural limitations   Postural Limitations Rounded Shoulders;Forward head   ROM / Strength   AROM / PROM / Strength AROM;PROM;Strength   AROM   AROM Assessment Site Shoulder   Right/Left Shoulder Right;Left   Right Shoulder Flexion 60 Degrees   Right Shoulder ABduction 65 Degrees   Right Shoulder Internal Rotation --  FIR unable   Right Shoulder External Rotation --  FER unable   Left Shoulder Flexion 40 Degrees   Left Shoulder ABduction 60 Degrees   Left Shoulder Internal Rotation --  FIR unable   Left Shoulder External Rotation --  FER C2   PROM   PROM Assessment Site Shoulder   Right/Left Shoulder Left   Left Shoulder Flexion 110 Degrees  ERP   Left Shoulder ABduction 110 Degrees  ERP   Left Shoulder Internal Rotation 40 Degrees  @ 90 degrees ABD, ERP   Left Shoulder External Rotation 35 Degrees  @ 90 degrees ABD, ERP   Strength   Strength Assessment Site Shoulder   Right/Left Shoulder Left   Left Shoulder Flexion 2-/5   Left Shoulder ABduction 2-/5   Left Shoulder Internal Rotation 3/5   Left Shoulder External Rotation 2-/5                   [facility] Adult PT Treatment/Exercise - 08/26/15 1031    Self-Care   Self-Care Posture   Posture Instructed to position with pillow under shoulder and resting on stomach with pillow under arm for sleeping , PT POC recommended 3 times a week, but pt only agreeable for 2 x a week.    Manual Therapy   Manual Therapy Soft tissue mobilization   Manual therapy comments STM to anterior shoulder, biceps with decreased pain from 8/10 to 7/10.                 PT Education - 08/26/15 1120    Education provided Yes   Education Details Instructed to position with pillow under shoulder and resting on stomach with pillow under arm for  sleeping , PT POC recommended 3 times a week, but pt only agreeable for 2 x a week.   Person(s) Educated Patient   Methods Demonstration;Explanation   Comprehension Verbalized understanding          PT Short Term Goals - 08/26/15 1133  PT SHORT TERM GOAL #1   Title Pt will be I with initial HEP for continued strengthening and mobility by 09/26/15   Time 4   Period Weeks   Status New   PT SHORT TERM GOAL #2   Title R shoulder PROM will improve by 10 degrees pain-free for improved overhead reaching activities by 09/26/15.   Time 4   Period Weeks   Status New           PT Long Term Goals - Sep 04, 2015 1135    PT LONG TERM GOAL #1   Title Pt will be indep with advanced HEP by 10/20/15.    Time 8   Period Weeks   Status New   PT LONG TERM GOAL #2   Title FOTO score will improve from 59% limited to 39% to demo improved function and mobility by 10/20/15.    Time 8   Period Weeks   Status New   PT LONG TERM GOAL #3   Title Pt will be able to lift 1# overhead x 10 reps without pain in order to return to pain-free functional activities for lifting overhead by 10/20/15.   Time 8   Period Weeks   Status New               Plan - 04-Sep-2015 1124    Clinical Impression Statement Pt presents for low complexity evaluation for shoulder pain. Signs and symptoms are compatible with "chronically torn rotator cuff" per MD diagnosis. Pt has hx of RTC repair in 12/2012, but has since fallen multiple times since and L shoulder pain has progressively worsening.  Pt presents with impairments including pain, impaired mobility/ROM, and impaired strength, which limit pt's functional abilities with reaching, lifting, overhead activity, carrying, dressing, grooming.  Pt will benefit from oupt PT for 3 times a week, but pt agreeable to only 2 times a week for 8 weeks for UE strengthening, stretching, pt education in order to address these impairments and functional limitations and return pt to maximal  level of function. FOTO scored 59% limited.    Pt will benefit from skilled therapeutic intervention in order to improve on the following deficits Decreased strength;Decreased mobility;Decreased range of motion;Impaired flexibility;Increased muscle spasms;Postural dysfunction;Obesity;Pain;Impaired UE functional use;Increased edema   Rehab Potential Fair   Clinical Impairments Affecting Rehab Potential chronic condition   PT Frequency 2x / week   PT Duration 8 weeks   PT Treatment/Interventions ADLs/Self Care Home Management;Functional mobility training;Therapeutic activities;Therapeutic exercise;Neuromuscular re-education;Patient/family education;Manual techniques;Passive range of motion   PT Next Visit Plan Est HEP. AAROM ther ex. Shoulder isometrics   PT Home Exercise Plan Prescribe Shoulder isometrics at next visit.    Consulted and Agree with Plan of Care Patient          G-Codes - Sep 04, 2015 1139    Functional Assessment Tool Used FOTO and clinical judgement   Functional Limitation Carrying, moving and handling objects   Carrying, Moving and Handling Objects Current Status [phone]) At least 60 percent but less than 80 percent impaired, limited or restricted   Carrying, Moving and Handling Objects Goal Status [id number]) At least 20 percent but less than 40 percent impaired, limited or restricted       Problem List Patient Active Problem List   Diagnosis Date Noted  . Eczema, dyshidrotic 06/19/2015  . Localized edema 06/19/2015  . 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 Allergy history, radiographic dye 11/21/2014  . Metastases to the liver (  [place]) 07/17/2014  . Routine general medical examination at a health care facility 07/10/2014  . Situational anxiety 04/18/2014  . Severe obesity (BMI >= 40) ([place]) 01/15/2014  . Coronary artery calcification seen on CAT scan 12/25/2013  . Atherosclerosis of aorta ([place]) 12/02/2013  . Unspecified  constipation 08/21/2013  . Breast cancer of lower-outer quadrant of left female breast ([place]) 05/09/2013  . Venous stasis dermatitis 03/22/2013  . Bone metastasis ([place]) 01/31/2013  . Unspecified vitamin D deficiency 12/23/2012  . Low back pain 12/01/2012  . Lymphedema of upper extremity following lymphadenectomy 02/15/2012  . Spinal stenosis in cervical region 10/28/2011  . Cervical spondylitis with radiculitis ([place]) 10/28/2011  . Hyperlipidemia with target LDL less than 100 09/03/2011  . Type 2 diabetes, uncontrolled, with neuropathy ([place]) 09/03/2011  . Osteoporosis 09/03/2011  . Hypothyroidism 09/03/2011  . Generalized OA 09/03/2011    Dollene Cleveland, PT 08/26/2015, 11:46 AM  [facility] [address] [place], [place], [address] Phone: [phone]   Fax:  [phone]  Name: Christy Harrell MRN: [id number] Date of Birth: 12/13/1940

## 2015-08-27 ENCOUNTER — Telehealth: Payer: Self-pay | Admitting: *Deleted

## 2015-08-27 ENCOUNTER — Ambulatory Visit (HOSPITAL_BASED_OUTPATIENT_CLINIC_OR_DEPARTMENT_OTHER): Payer: Medicare Other

## 2015-08-27 ENCOUNTER — Other Ambulatory Visit (HOSPITAL_BASED_OUTPATIENT_CLINIC_OR_DEPARTMENT_OTHER): Payer: Medicare Other

## 2015-08-27 ENCOUNTER — Other Ambulatory Visit: Payer: Self-pay | Admitting: Internal Medicine

## 2015-08-27 VITALS — BP 134/57 | HR 70 | Temp 98.2°F

## 2015-08-27 DIAGNOSIS — C50512 Malignant neoplasm of lower-outer quadrant of left female breast: Secondary | ICD-10-CM | POA: Diagnosis not present

## 2015-08-27 DIAGNOSIS — C7951 Secondary malignant neoplasm of bone: Secondary | ICD-10-CM | POA: Diagnosis not present

## 2015-08-27 DIAGNOSIS — Z5111 Encounter for antineoplastic chemotherapy: Secondary | ICD-10-CM | POA: Diagnosis not present

## 2015-08-27 DIAGNOSIS — C787 Secondary malignant neoplasm of liver and intrahepatic bile duct: Secondary | ICD-10-CM

## 2015-08-27 LAB — CBC WITH DIFFERENTIAL/PLATELET
BASO%: 0.4 % (ref 0.0–2.0)
Basophils Absolute: 0 10*3/uL (ref 0.0–0.1)
EOS ABS: 0.1 10*3/uL (ref 0.0–0.5)
EOS%: 2.7 % (ref 0.0–7.0)
HCT: 33.9 % — ABNORMAL LOW (ref 34.8–46.6)
HGB: 10.8 g/dL — ABNORMAL LOW (ref 11.6–15.9)
LYMPH%: 29.3 % (ref 14.0–49.7)
MCH: 25.7 pg (ref 25.1–34.0)
MCHC: 31.9 g/dL (ref 31.5–36.0)
MCV: 80.5 fL (ref 79.5–101.0)
MONO#: 0.4 10*3/uL (ref 0.1–0.9)
MONO%: 9.4 % (ref 0.0–14.0)
NEUT#: 2.6 10*3/uL (ref 1.5–6.5)
NEUT%: 58.2 % (ref 38.4–76.8)
PLATELETS: 219 10*3/uL (ref 145–400)
RBC: 4.21 10*6/uL (ref 3.70–5.45)
RDW: 16.5 % — ABNORMAL HIGH (ref 11.2–14.5)
WBC: 4.5 10*3/uL (ref 3.9–10.3)
lymph#: 1.3 10*3/uL (ref 0.9–3.3)

## 2015-08-27 LAB — COMPREHENSIVE METABOLIC PANEL
ALBUMIN: 3 g/dL — AB (ref 3.5–5.0)
ALK PHOS: 90 U/L (ref 40–150)
ALT: <9 U/L (ref 0–55)
ANION GAP: 9 meq/L (ref 3–11)
AST: 17 U/L (ref 5–34)
BILIRUBIN TOTAL: 0.42 mg/dL (ref 0.20–1.20)
BUN: 14.8 mg/dL (ref 7.0–26.0)
CO2: 26 mEq/L (ref 22–29)
CREATININE: 0.8 mg/dL (ref 0.6–1.1)
Calcium: 9.2 mg/dL (ref 8.4–10.4)
Chloride: 108 mEq/L (ref 98–109)
EGFR: 82 mL/min/{1.73_m2} — AB (ref >90)
Glucose: 159 mg/dl — ABNORMAL HIGH (ref 70–140)
Potassium: 3.8 mEq/L (ref 3.5–5.1)
Sodium: 142 mEq/L (ref 136–145)
TOTAL PROTEIN: 7 g/dL (ref 6.4–8.3)

## 2015-08-27 MED ORDER — FULVESTRANT 250 MG/5ML IM SOLN
500.0000 mg | Freq: Once | INTRAMUSCULAR | Status: AC
  Administered 2015-08-27: 500 mg via INTRAMUSCULAR
  Filled 2015-08-27: qty 10

## 2015-08-27 MED ORDER — AMOXICILLIN-POT CLAVULANATE 875-125 MG PO TABS: 1.0000 | ORAL_TABLET | Freq: Two times a day (BID) | ORAL | Status: DC

## 2015-08-27 MED FILL — AMOX-CLAV 875-125 MG TABLET: 875-125 | 14 days supply | Qty: 28 | Fill #0

## 2015-08-27 NOTE — Telephone Encounter (Signed)
Notified patient.

## 2015-08-27 NOTE — Telephone Encounter (Signed)
Start augmentin 

## 2015-08-27 NOTE — Telephone Encounter (Signed)
Pt left msg on triage stating she does not have a dentist, and she has a infected tooth that is causing pain all the way up in her jaw, can't chew on that side. She has made appt to see md on 1/30, but she is wanting to see if he can rx antibiotic until she is able to see him on the 1/30...Johny Chess

## 2015-08-30 ENCOUNTER — Telehealth: Payer: Self-pay | Admitting: *Deleted

## 2015-08-30 ENCOUNTER — Encounter: Payer: Self-pay | Admitting: Podiatry

## 2015-08-30 ENCOUNTER — Ambulatory Visit (INDEPENDENT_AMBULATORY_CARE_PROVIDER_SITE_OTHER): Payer: Medicare Other | Admitting: Podiatry

## 2015-08-30 DIAGNOSIS — L84 Corns and callosities: Secondary | ICD-10-CM

## 2015-08-30 DIAGNOSIS — M79676 Pain in unspecified toe(s): Secondary | ICD-10-CM | POA: Diagnosis not present

## 2015-08-30 DIAGNOSIS — B351 Tinea unguium: Secondary | ICD-10-CM | POA: Diagnosis not present

## 2015-08-30 DIAGNOSIS — E1149 Type 2 diabetes mellitus with other diabetic neurological complication: Secondary | ICD-10-CM | POA: Diagnosis not present

## 2015-08-30 NOTE — Addendum Note (Signed)
Addended by: Harriett Sine D on: 08/30/2015 11:36 AM   Modules accepted: Orders

## 2015-08-30 NOTE — Progress Notes (Signed)
Patient ID: Christy Harrell, female   DOB: 07-08-1941, 75 y.o.   MRN: [id number]  Subjective: 75 y.o.-year-old female returns the office today for painful, elongated, thickened toenails which she is unable to trim herself as well as callus. Denies any redness or drainage around the nails or calluses. Denies any acute changes since last appointment and no new complaints today. Denies any systemic complaints such as fevers, chills, nausea, vomiting.   Objective: AAO 3, NAD DP/PT pulses palpable, CRT less than 3 seconds Chronic appearing edema to bilateral lower extremities. Protective sensation intact with Derrel Nip monofilament Nails hypertrophic, dystrophic, elongated, brittle, discolored 10. There is tenderness overlying the nails 1-5 bilaterally. There is no surrounding erythema or drainage along the nail sites. Hyperkeratotic lesion right submetatarsal 3/4 and bilateral heels. Upon debridement no underlying ulceration, drainage or other signs of infection. No open lesions or other pre-ulcerative lesions are identified. No other areas of tenderness bilateral lower extremities. No overlying edema, erythema, increased warmth. No pain with calf compression, swelling, warmth, erythema.  Assessment: Patient presents with symptomatic onychomycosis; hyperkeratotic lesions  Plan: -Treatment options including alternatives, risks, complications were discussed -Nails sharply debrided 10 without complication/bleeding. -Hyperkeratotic lesions sharply debrided x 3 without complications/bleeding. -Will obtain venous duplex due to swelling the legs which appears to be slightly increased on the right compared to normal. There is no pain with calf compression and there is no warmth. DVT unlikely.  -Discussed daily foot inspection. If there are any changes, to call the office immediately.  -Follow-up in 3 months or sooner if any problems are to arise. In the meantime, encouraged to call the office  with any questions, concerns, changes symptoms.  Celesta Gentile, DPM

## 2015-08-30 NOTE — Telephone Encounter (Signed)
Orders for Venous dopplers faxed.

## 2015-09-03 ENCOUNTER — Ambulatory Visit: Payer: Medicare Other | Admitting: Physical Therapy

## 2015-09-03 DIAGNOSIS — M25512 Pain in left shoulder: Secondary | ICD-10-CM

## 2015-09-03 DIAGNOSIS — M75102 Unspecified rotator cuff tear or rupture of left shoulder, not specified as traumatic: Principal | ICD-10-CM

## 2015-09-03 DIAGNOSIS — Z7409 Other reduced mobility: Secondary | ICD-10-CM

## 2015-09-03 DIAGNOSIS — M12512 Traumatic arthropathy, left shoulder: Secondary | ICD-10-CM | POA: Diagnosis not present

## 2015-09-03 DIAGNOSIS — Z9181 History of falling: Secondary | ICD-10-CM

## 2015-09-03 DIAGNOSIS — R29898 Other symptoms and signs involving the musculoskeletal system: Secondary | ICD-10-CM

## 2015-09-03 DIAGNOSIS — M12812 Other specific arthropathies, not elsewhere classified, left shoulder: Secondary | ICD-10-CM

## 2015-09-03 NOTE — Patient Instructions (Addendum)
Press palms together with thumbs crossed. Hold 5 sec, repeat 10 times for deltoid contraction.    Strengthening: Isometric Flexion  Using wall for resistance, press right fist into ball using light pressure. Hold _5___ seconds. Repeat __10__ times per set. Do _2___ sets per session. Do _2___ sessions per day.   Extension (Isometric)  Place left bent elbow and back of arm against wall. Press elbow against wall. Hold _5___ seconds. Repeat __10__ times. Do _2___ sessions per day.  Internal Rotation (Isometric)  Place palm of right fist against door frame, with elbow bent. Press fist against door frame. Hold __5__ seconds. Repeat _10___ times. Do __2__ sessions per day.  External Rotation (Isometric)  Place back of left fist against door frame, with elbow bent. Press fist against door frame. Hold _5___ seconds. Repeat __10__ times. Do __2__ sessions per day.  SHOULDER: External Rotation - Supine (Cane)   Hold cane with both hands. Rotate arm away from body. Keep elbow on floor and next to body. __10_ reps per set, _2-3__ sets per day, _7__ days per week Add towel to keep elbow at side.  Cane Exercise: Flexion   Lie on back, holding cane above chest. Keeping arms as straight as possible, lower cane toward floor beyond head. Hold ___3-5_ seconds. Repeat __10__ times. Do _2___ sessions per day.  http://gt2.exer.us/91   Copyright  VHI. All rights reserved.

## 2015-09-04 ENCOUNTER — Other Ambulatory Visit: Payer: Self-pay | Admitting: Podiatry

## 2015-09-04 DIAGNOSIS — R609 Edema, unspecified: Secondary | ICD-10-CM

## 2015-09-04 NOTE — Therapy (Signed)
[place], [place], [address] Phone: [phone]   Fax:  [phone]  Physical Therapy Treatment  Patient Details  Name: Christy Harrell MRN: [id number] Date of Birth: 1940/12/18 Referring Provider: Tania Ade  Encounter Date: 09/03/2015      PT End of Session - 09/03/15 1025    Visit Number 2   Number of Visits 20   Date for PT Re-Evaluation 10/20/15   PT Start Time 1017   PT Stop Time 1100   PT Time Calculation (min) 43 min      Past Medical History  Diagnosis Date  . Club foot   . Arthritis   . Thyroid disease   . Hyperlipidemia   . Osteoporosis   . Hypertension   . GERD (gastroesophageal reflux disease)     watches diet  . Neuromuscular disorder (HCC)     lt arm numb sometimes  . Wears glasses   . History of radiation therapy 02/22/13- 03/13/13    left proximal humerus 3500 cGy 14 sessions  . Lymphedema     right arm  . Diabetes mellitus     metformin  . Cancer [facility])     right breast  . Metastasis from malignant tumor of breast ([place])     left humerous  . Metastasis from malignant tumor of breast [facility])     liver    Past Surgical History  Procedure Laterality Date  . Cesarean section    . Thyroidectomy  2002  . Foot surgery      as a child for club foot  . Breast surgery  2012    rt lump-16 nodes-in [place]  . Cesarean section    . Colonoscopy    . Dilation and curettage of uterus    . Humerus im nail Left 12/27/2012    Procedure: INTRAMEDULLARY (IM) NAIL HUMERAL LEFT PATHOLOGIC FRACTURE;  Surgeon: Nita Sells, MD;  Location: [place];  Service: Orthopedics;  Laterality: Left;    There were no vitals filed for this visit.  Visit Diagnosis:  Rotator cuff tear arthropathy, left  Pain in joint of left shoulder  Weakness of left arm  Impaired mobility and ADLs  Patient had 2 or more falls in past year      Subjective Assessment - 09/03/15  1025    Subjective Can be an 8/10 sometimes.    Currently in Pain? Yes   Pain Score 6   or 7/10    Pain Location Shoulder   Pain Orientation Left   Aggravating Factors  trying to reach    Pain Relieving Factors topical alcohol- use ice and heat      Treatment:   Supine cane pressups and ER, 10 x 2 each  Isometrics all planes except abduction- performed seated using self manual resistance  and standing using wall x10 for 5 sec each                          PT Education - 09/03/15 0837    Education provided Yes   Education Details shouder isometrics and supine cane press ups, ER, palm press for deltoid activation.    Person(s) Educated Patient   Methods Explanation;Handout   Comprehension Verbalized understanding          PT Short Term Goals - 08/26/15 1133    PT SHORT TERM GOAL #1   Title Pt will be I with initial HEP for continued  strengthening and mobility by 09/26/15   Time 4   Period Weeks   Status New   PT SHORT TERM GOAL #2   Title R shoulder PROM will improve by 10 degrees pain-free for improved overhead reaching activities by 09/26/15.   Time 4   Period Weeks   Status New           PT Long Term Goals - 08/26/15 1135    PT LONG TERM GOAL #1   Title Pt will be indep with advanced HEP by 10/20/15.    Time 8   Period Weeks   Status New   PT LONG TERM GOAL #2   Title FOTO score will improve from 59% limited to 39% to demo improved function and mobility by 10/20/15.    Time 8   Period Weeks   Status New   PT LONG TERM GOAL #3   Title Pt will be able to lift 1# overhead x 10 reps without pain in order to return to pain-free functional activities for lifting overhead by 10/20/15.   Time 8   Period Weeks   Status New               Plan - 09/03/15 [id number]    Clinical Impression Statement Instructed pt in isometrics and gentle cane exercises for HEP monitoring pain throughout treatment. Pt is concerned about co-pay and may reduce to 1  x per week after inital 2 weeks.    PT Next Visit Plan Review cane and isometrics. Continue AAROM exercises as tolerated        Problem List Patient Active Problem List   Diagnosis Date Noted  . Eczema, dyshidrotic 06/19/2015  . Localized edema 06/19/2015  . 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 Allergy history, radiographic dye 11/21/2014  . Metastases to the liver ([place]) 07/17/2014  . Routine general medical examination at a health care facility 07/10/2014  . Situational anxiety 04/18/2014  . Severe obesity (BMI >= 40) ([place]) 01/15/2014  . Coronary artery calcification seen on CAT scan 12/25/2013  . Atherosclerosis of aorta ([place]) 12/02/2013  . Unspecified constipation 08/21/2013  . Breast cancer of lower-outer quadrant of left female breast ([place]) 05/09/2013  . Venous stasis dermatitis 03/22/2013  . Bone metastasis ([place]) 01/31/2013  . Unspecified vitamin D deficiency 12/23/2012  . Low back pain 12/01/2012  . Lymphedema of upper extremity following lymphadenectomy 02/15/2012  . Spinal stenosis in cervical region 10/28/2011  . Cervical spondylitis with radiculitis ([place]) 10/28/2011  . Hyperlipidemia with target LDL less than 100 09/03/2011  . Type 2 diabetes, uncontrolled, with neuropathy ([place]) 09/03/2011  . Osteoporosis 09/03/2011  . Hypothyroidism 09/03/2011  . Generalized OA 09/03/2011    Dorene Ar, PTA 09/04/2015, 8:40 AM  [place], [place], [address] Phone: [phone]   Fax:  [phone]  Name: Christy Harrell MRN: [id number] Date of Birth: [birth year]

## 2015-09-05 ENCOUNTER — Ambulatory Visit: Payer: Medicare Other

## 2015-09-05 DIAGNOSIS — Z7409 Other reduced mobility: Secondary | ICD-10-CM

## 2015-09-05 DIAGNOSIS — M12512 Traumatic arthropathy, left shoulder: Secondary | ICD-10-CM | POA: Diagnosis not present

## 2015-09-05 DIAGNOSIS — M25512 Pain in left shoulder: Secondary | ICD-10-CM

## 2015-09-05 DIAGNOSIS — R29898 Other symptoms and signs involving the musculoskeletal system: Secondary | ICD-10-CM

## 2015-09-05 DIAGNOSIS — Z9181 History of falling: Secondary | ICD-10-CM

## 2015-09-05 DIAGNOSIS — M12812 Other specific arthropathies, not elsewhere classified, left shoulder: Secondary | ICD-10-CM

## 2015-09-05 DIAGNOSIS — M75102 Unspecified rotator cuff tear or rupture of left shoulder, not specified as traumatic: Principal | ICD-10-CM

## 2015-09-05 NOTE — Therapy (Signed)
[place], [place], [address] Phone: [phone]   Fax:  [phone]  Physical Therapy Treatment  Patient Details  Name: Christy Harrell MRN: [id number] Date of Birth: 1940/12/12 Referring Provider: Tania Ade  Encounter Date: 09/05/2015      PT End of Session - 09/05/15 1027    Visit Number 3   Number of Visits 20   Date for PT Re-Evaluation 10/20/15   PT Start Time [id number]   PT Stop Time 1102   PT Time Calculation (min) 48 min   Activity Tolerance Patient tolerated treatment well   Behavior During Therapy [facility] for tasks assessed/performed      Past Medical History  Diagnosis Date  . Club foot   . Arthritis   . Thyroid disease   . Hyperlipidemia   . Osteoporosis   . Hypertension   . GERD (gastroesophageal reflux disease)     watches diet  . Neuromuscular disorder (HCC)     lt arm numb sometimes  . Wears glasses   . History of radiation therapy 02/22/13- 03/13/13    left proximal humerus 3500 cGy 14 sessions  . Lymphedema     right arm  . Diabetes mellitus     metformin  . Cancer [facility])     right breast  . 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 Metastasis from malignant tumor of breast [facility])     liver    Past Surgical History  Procedure Laterality Date  . Cesarean section    . Thyroidectomy  2002  . Foot surgery      as a child for club foot  . Breast surgery  2012    rt lump-16 nodes-in [place]  . Cesarean section    . Colonoscopy    . Dilation and curettage of uterus    . Humerus im nail Left 12/27/2012    Procedure: INTRAMEDULLARY (IM) NAIL HUMERAL LEFT PATHOLOGIC FRACTURE;  Surgeon: Nita Sells, MD;  Location: [place];  Service: Orthopedics;  Laterality: Left;    There were no vitals filed for this visit.  Visit Diagnosis:  Rotator cuff tear arthropathy, left  Pain in joint of left shoulder  Weakness of left  arm  Impaired mobility and ADLs  Patient had 2 or more falls in past year      Subjective Assessment - 09/05/15 1016    Subjective "I think I'm at an 8/10."  Pt reports compliance with HEP.    Currently in Pain? Yes   Pain Score 8    Pain Location Shoulder   Pain Orientation Left   Pain Descriptors / Indicators Aching;Sharp   Pain Type Chronic pain   Pain Onset More than a month ago                         [facility] Adult PT Treatment/Exercise - 09/05/15 0001    Shoulder Exercises: Supine   Other Supine Exercises supine cane pressups and ER 5 reps x 2 each.    Shoulder Exercises: Standing   Other Standing Exercises isometrics all planes-  x 15 reps, 5 sec hold   HEP   Other Standing Exercises UE ranger   Modalities   Modalities Cryotherapy   Cryotherapy   Number Minutes Cryotherapy 10 Minutes   Cryotherapy Location Shoulder   Type of Cryotherapy Ice pack   Manual Therapy   Manual Therapy Joint  mobilization;Soft tissue mobilization;Passive ROM   Joint Mobilization gentle joint distraction , Inf- post glide grade 1-3    Soft tissue mobilization STM to anterior shoulder, biceps with decreased pain from 8/10 to 7/10.    Passive ROM PROM L shoulder all directions                 PT Education - 09/05/15 1025    Education provided Yes   Education Details continue isometrics and supine press    Person(s) Educated Patient   Methods Explanation;Handout   Comprehension Verbalized understanding          PT Short Term Goals - 08/26/15 1133    PT SHORT TERM GOAL #1   Title Pt will be I with initial HEP for continued strengthening and mobility by 09/26/15   Time 4   Period Weeks   Status New   PT SHORT TERM GOAL #2   Title R shoulder PROM will improve by 10 degrees pain-free for improved overhead reaching activities by 09/26/15.   Time 4   Period Weeks   Status New           PT Long Term Goals - 08/26/15 1135    PT LONG TERM GOAL #1   Title Pt  will be indep with advanced HEP by 10/20/15.    Time 8   Period Weeks   Status New   PT LONG TERM GOAL #2   Title FOTO score will improve from 59% limited to 39% to demo improved function and mobility by 10/20/15.    Time 8   Period Weeks   Status New   PT LONG TERM GOAL #3   Title Pt will be able to lift 1# overhead x 10 reps without pain in order to return to pain-free functional activities for lifting overhead by 10/20/15.   Time 8   Period Weeks   Status New               Plan - 09/05/15 1027    Clinical Impression Statement Pt reports she will have to decrease to 1 x a week due to co-pay, for next week. Reviewed isometrics and cane ther ex for HEP.  PT explained importance of pain-free HEP as a result of decreasing to 1 x a week. Reviewed importance of pain- free HEP to prevent worsening symptoms, since pt stated she thought she should "feel it in order to know it was working."  Pt verbalized understanding of importance of pain- free activities and HEP. Pain decreased from 8/10 to 7/10 after treatment.     PT Frequency 2x / week   PT Duration 8 weeks   PT Next Visit Plan Review cane and isometrics. Progresss AAROM and HEP exercises as tolerated   PT Home Exercise Plan Prescribe table slides/ AAROM at next visit.    Consulted and Agree with Plan of Care Patient        Problem List Patient Active Problem List   Diagnosis Date Noted  . Eczema, dyshidrotic 06/19/2015  . Localized edema 06/19/2015  . 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 Allergy history, radiographic dye 11/21/2014  . Metastases to the liver ([place]) 07/17/2014  . Routine general medical examination at a health care facility 07/10/2014  . Situational anxiety 04/18/2014  . Severe obesity (BMI >= 40) ([place]) 01/15/2014  . Coronary artery calcification seen on CAT scan 12/25/2013  . Atherosclerosis of aorta ([place]) 12/02/2013  . Unspecified constipation 08/21/2013  .  Breast cancer of lower-outer quadrant of left female breast ([place]) 05/09/2013  . Venous stasis dermatitis 03/22/2013  . Bone metastasis ([place]) 01/31/2013  . Unspecified vitamin D deficiency 12/23/2012  . Low back pain 12/01/2012  . Lymphedema of upper extremity following lymphadenectomy 02/15/2012  . Spinal stenosis in cervical region 10/28/2011  . Cervical spondylitis with radiculitis ([place]) 10/28/2011  . Hyperlipidemia with target LDL less than 100 09/03/2011  . Type 2 diabetes, uncontrolled, with neuropathy ([place]) 09/03/2011  . Osteoporosis 09/03/2011  . Hypothyroidism 09/03/2011  . Generalized OA 09/03/2011    Dollene Cleveland, PT 09/05/2015, 11:00 AM  [place], [place], [address] Phone: [phone]   Fax:  [phone]  Name: Christy Harrell MRN: [id number] Date of Birth: Nov 06, 1940

## 2015-09-09 ENCOUNTER — Encounter: Payer: Self-pay | Admitting: Internal Medicine

## 2015-09-09 ENCOUNTER — Other Ambulatory Visit (INDEPENDENT_AMBULATORY_CARE_PROVIDER_SITE_OTHER): Payer: Medicare Other

## 2015-09-09 ENCOUNTER — Ambulatory Visit (INDEPENDENT_AMBULATORY_CARE_PROVIDER_SITE_OTHER): Payer: Medicare Other | Admitting: Internal Medicine

## 2015-09-09 VITALS — BP 130/80 | HR 86 | Temp 97.9°F | Resp 16 | Ht 60.0 in | Wt 170.0 lb

## 2015-09-09 DIAGNOSIS — C787 Secondary malignant neoplasm of liver and intrahepatic bile duct: Secondary | ICD-10-CM

## 2015-09-09 DIAGNOSIS — E038 Other specified hypothyroidism: Secondary | ICD-10-CM

## 2015-09-09 DIAGNOSIS — G4489 Other headache syndrome: Secondary | ICD-10-CM | POA: Diagnosis not present

## 2015-09-09 DIAGNOSIS — L301 Dyshidrosis [pompholyx]: Secondary | ICD-10-CM

## 2015-09-09 DIAGNOSIS — Z23 Encounter for immunization: Secondary | ICD-10-CM

## 2015-09-09 DIAGNOSIS — C50512 Malignant neoplasm of lower-outer quadrant of left female breast: Secondary | ICD-10-CM

## 2015-09-09 LAB — CBC WITH DIFFERENTIAL/PLATELET
Basophils Absolute: 0 10*3/uL (ref 0.0–0.1)
Basophils Relative: 0.6 % (ref 0.0–3.0)
EOS ABS: 0.2 10*3/uL (ref 0.0–0.7)
Eosinophils Relative: 2.9 % (ref 0.0–5.0)
HEMATOCRIT: 37.8 % (ref 36.0–46.0)
HEMOGLOBIN: 12.4 g/dL (ref 12.0–15.0)
LYMPHS PCT: 31.1 % (ref 12.0–46.0)
Lymphs Abs: 1.8 10*3/uL (ref 0.7–4.0)
MCHC: 32.7 g/dL (ref 30.0–36.0)
MCV: 79.1 fl (ref 78.0–100.0)
MONO ABS: 0.5 10*3/uL (ref 0.1–1.0)
Monocytes Relative: 8.9 % (ref 3.0–12.0)
Neutro Abs: 3.2 10*3/uL (ref 1.4–7.7)
Neutrophils Relative %: 56.5 % (ref 43.0–77.0)
Platelets: 356 10*3/uL (ref 150.0–400.0)
RBC: 4.78 Mil/uL (ref 3.87–5.11)
RDW: 17.4 % — ABNORMAL HIGH (ref 11.5–15.5)
WBC: 5.7 10*3/uL (ref 4.0–10.5)

## 2015-09-09 LAB — HEPATIC FUNCTION PANEL
ALT: 10 U/L (ref 0–35)
AST: 19 U/L (ref 0–37)
Albumin: 3.7 g/dL (ref 3.5–5.2)
Alkaline Phosphatase: 80 U/L (ref 39–117)
BILIRUBIN DIRECT: 0.1 mg/dL (ref 0.0–0.3)
BILIRUBIN TOTAL: 0.5 mg/dL (ref 0.2–1.2)
TOTAL PROTEIN: 7.6 g/dL (ref 6.0–8.3)

## 2015-09-09 LAB — TSH: TSH: 10.05 u[IU]/mL — AB (ref 0.35–4.50)

## 2015-09-09 MED ORDER — FLUOCINONIDE-E 0.05 % EX CREA: 1.0000 "application " | TOPICAL_CREAM | Freq: Two times a day (BID) | CUTANEOUS | Status: DC

## 2015-09-09 MED ORDER — DOXEPIN HCL 25 MG PO CAPS: 50.0000 mg | ORAL_CAPSULE | Freq: Every evening | ORAL | Status: DC | PRN

## 2015-09-09 MED FILL — *AFINITOR 10MG TAB: 10 | 28 days supply | Qty: 28 | Fill #1

## 2015-09-09 MED FILL — EXEMESTANE 25 MG TABLET: 25 | 30 days supply | Qty: 30 | Fill #1

## 2015-09-09 NOTE — Progress Notes (Signed)
Pre visit review using our clinic review tool, if applicable. No additional management support is needed unless otherwise documented below in the visit note. 

## 2015-09-09 NOTE — Patient Instructions (Signed)
Eczema Eczema, also called atopic dermatitis, is a skin disorder that causes inflammation of the skin. It causes a red rash and dry, scaly skin. The skin becomes very itchy. Eczema is generally worse during the cooler winter months and often improves with the warmth of summer. Eczema usually starts showing signs in infancy. Some children outgrow eczema, but it may last through adulthood.  CAUSES  The exact cause of eczema is not known, but it appears to run in families. People with eczema often have a family history of eczema, allergies, asthma, or hay fever. Eczema is not contagious. Flare-ups of the condition may be caused by:   Contact with something you are sensitive or allergic to.   Stress. SIGNS AND SYMPTOMS  Dry, scaly skin.   Red, itchy rash.   Itchiness. This may occur before the skin rash and may be very intense.  DIAGNOSIS  The diagnosis of eczema is usually made based on symptoms and medical history. TREATMENT  Eczema cannot be cured, but symptoms usually can be controlled with treatment and other strategies. A treatment plan might include:  Controlling the itching and scratching.   Use over-the-counter antihistamines as directed for itching. This is especially useful at night when the itching tends to be worse.   Use over-the-counter steroid creams as directed for itching.   Avoid scratching. Scratching makes the rash and itching worse. It may also result in a skin infection (impetigo) due to a break in the skin caused by scratching.   Keeping the skin well moisturized with creams every day. This will seal in moisture and help prevent dryness. Lotions that contain alcohol and water should be avoided because they can dry the skin.   Limiting exposure to things that you are sensitive or allergic to (allergens).   Recognizing situations that cause stress.   Developing a plan to manage stress.  HOME CARE INSTRUCTIONS   Only take over-the-counter or  prescription medicines as directed by your health care provider.   Do not use anything on the skin without checking with your health care provider.   Keep baths or showers short (5 minutes) in warm (not hot) water. Use mild cleansers for bathing. These should be unscented. You may add nonperfumed bath oil to the bath water. It is best to avoid soap and bubble bath.   Immediately after a bath or shower, when the skin is still damp, apply a moisturizing ointment to the entire body. This ointment should be a petroleum ointment. This will seal in moisture and help prevent dryness. The thicker the ointment, the better. These should be unscented.   Keep fingernails cut short. Children with eczema may need to wear soft gloves or mittens at night after applying an ointment.   Dress in clothes made of cotton or cotton blends. Dress lightly, because heat increases itching.   A child with eczema should stay away from anyone with fever blisters or cold sores. The virus that causes fever blisters (herpes simplex) can cause a serious skin infection in children with eczema. SEEK MEDICAL CARE IF:   Your itching interferes with sleep.   Your rash gets worse or is not better within 1 week after starting treatment.   You see pus or soft yellow scabs in the rash area.   You have a fever.   You have a rash flare-up after contact with someone who has fever blisters.    This information is not intended to replace advice given to you by your health care   provider. Make sure you discuss any questions you have with your health care provider.   Document Released: 07/24/2000 Document Revised: 05/17/2013 Document Reviewed: 02/27/2013 Elsevier Interactive Patient Education 2016 [employer] Inc.  

## 2015-09-10 ENCOUNTER — Ambulatory Visit: Payer: Medicare Other

## 2015-09-10 ENCOUNTER — Other Ambulatory Visit: Payer: Self-pay

## 2015-09-10 DIAGNOSIS — Z7409 Other reduced mobility: Secondary | ICD-10-CM

## 2015-09-10 DIAGNOSIS — R29898 Other symptoms and signs involving the musculoskeletal system: Secondary | ICD-10-CM

## 2015-09-10 DIAGNOSIS — M12812 Other specific arthropathies, not elsewhere classified, left shoulder: Secondary | ICD-10-CM

## 2015-09-10 DIAGNOSIS — L301 Dyshidrosis [pompholyx]: Secondary | ICD-10-CM

## 2015-09-10 DIAGNOSIS — Z789 Other specified health status: Secondary | ICD-10-CM

## 2015-09-10 DIAGNOSIS — M75102 Unspecified rotator cuff tear or rupture of left shoulder, not specified as traumatic: Principal | ICD-10-CM

## 2015-09-10 DIAGNOSIS — M12512 Traumatic arthropathy, left shoulder: Secondary | ICD-10-CM | POA: Diagnosis not present

## 2015-09-10 DIAGNOSIS — Z9181 History of falling: Secondary | ICD-10-CM

## 2015-09-10 DIAGNOSIS — M25512 Pain in left shoulder: Secondary | ICD-10-CM

## 2015-09-10 MED ORDER — LEVOTHYROXINE SODIUM 175 MCG PO TABS: 175.0000 ug | ORAL_TABLET | Freq: Every day | ORAL | Status: AC

## 2015-09-10 MED ORDER — FLUOCINONIDE-E 0.05 % EX CREA: 1.0000 "application " | TOPICAL_CREAM | Freq: Two times a day (BID) | CUTANEOUS | Status: DC

## 2015-09-10 MED FILL — FLUOCINONIDE-E 0.05% CREAM: 0.05 | 20 days supply | Qty: 60 | Fill #0

## 2015-09-10 MED FILL — LEVOTHYROXINE 175 MCG TAB: 175 | 90 days supply | Qty: 90 | Fill #0

## 2015-09-10 NOTE — Therapy (Signed)
[place], [place], [address] Phone: [phone]   Fax:  [phone]  Physical Therapy Treatment  Patient Details  Name: Christy Harrell MRN: [id number] Date of Birth: 1940/09/01 Referring Provider: Tania Ade  Encounter Date: 09/10/2015      PT End of Session - 09/10/15 1037    Visit Number 4   Number of Visits 20   Date for PT Re-Evaluation 10/20/15   PT Start Time [id number]   PT Stop Time 1105   PT Time Calculation (min) 47 min   Activity Tolerance Patient tolerated treatment well   Behavior During Therapy [facility] for tasks assessed/performed      Past Medical History  Diagnosis Date  . Club foot   . Arthritis   . Thyroid disease   . Hyperlipidemia   . Osteoporosis   . Hypertension   . GERD (gastroesophageal reflux disease)     watches diet  . Neuromuscular disorder (HCC)     lt arm numb sometimes  . Wears glasses   . History of radiation therapy 02/22/13- 03/13/13    left proximal humerus 3500 cGy 14 sessions  . Lymphedema     right arm  . Diabetes mellitus     metformin  . Cancer [facility])     right breast  . 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 Metastasis from malignant tumor of breast [facility])     liver    Past Surgical History  Procedure Laterality Date  . Cesarean section    . Thyroidectomy  2002  . Foot surgery      as a child for club foot  . Breast surgery  2012    rt lump-16 nodes-in [place]  . Cesarean section    . Colonoscopy    . Dilation and curettage of uterus    . Humerus im nail Left 12/27/2012    Procedure: INTRAMEDULLARY (IM) NAIL HUMERAL LEFT PATHOLOGIC FRACTURE;  Surgeon: Nita Sells, MD;  Location: [place];  Service: Orthopedics;  Laterality: Left;    There were no vitals filed for this visit.  Visit Diagnosis:  Rotator cuff tear arthropathy, left  Pain in joint of left shoulder  Weakness of left  arm  Impaired mobility and ADLs  Patient had 2 or more falls in past year      Subjective Assessment - 09/10/15 1020    Subjective Pt reports she recieved pain meds from MD this AM (8 am) and is pain- free currently.  Pt reports improvements in shoulder are noted and is able to get coat off easier with less pain. Pt reports compliance with HEP 2-3 times a day and began to perform isometrics immediately upon arrival without cuing.    Currently in Pain? Yes   Pain Score 0-No pain   Pain Location Shoulder   Pain Orientation Left   Pain Descriptors / Indicators Aching   Pain Type Chronic pain                         [facility] Adult PT Treatment/Exercise - 09/10/15 0001    Shoulder Exercises: Seated   External Rotation AAROM;10 reps  table slides and on UE Ranger, HEP   Flexion AAROM;10 reps  table slides, UE ranger , HEP   Shoulder Exercises: Standing   Other Standing Exercises isometrics all planes-  x 15 reps, 5 sec hold   HEP  Other Standing Exercises UE ranger for flexion, ER, 10 x each in standing    Manual Therapy   Joint Mobilization gentle joint distraction , Inf- post glide grade 1-3    Soft tissue mobilization STM to anterior shoulder, biceps   Passive ROM PROM L shoulder all directions                 PT Education - 09/10/15 1036    Education provided Yes   Education Details Added AAROM table slides flexion and ER, Also instructed in use of cane in sitting for AAROM flexion  ( like UE ranger) for home.    Person(s) Educated Patient   Methods Explanation;Handout   Comprehension Verbalized understanding          PT Short Term Goals - 09/10/15 1045    PT SHORT TERM GOAL #1   Title Pt will be I with initial HEP for continued strengthening and mobility by 09/26/15   Time 4   Period Weeks   Status On-going   PT SHORT TERM GOAL #2   Title R shoulder PROM will improve by 10 degrees pain-free for improved overhead reaching activities by 09/26/15.    Time 4   Period Weeks   Status On-going           PT Long Term Goals - 09/10/15 1045    PT LONG TERM GOAL #1   Title Pt will be indep with advanced HEP by 10/20/15.    Time 8   Period Weeks   Status On-going   PT LONG TERM GOAL #2   Title FOTO score will improve from 59% limited to 39% to demo improved function and mobility by 10/20/15.    Time 8   Period Weeks   Status On-going   PT LONG TERM GOAL #3   Title Pt will be able to lift 1# overhead x 10 reps without pain in order to return to pain-free functional activities for lifting overhead by 10/20/15.   Time 8   Period Weeks   Status On-going               Plan - 09/10/15 1037    Clinical Impression Statement Pt demonstrated understanding isometrics for HEP. Pt is starting 1 x this week at her discretion, but will return 2 x a week next week. Added table slides AAROM flexion and ER AAROM without increased pain or difficulty. Pt requires active assist to produce ER in sitting.    PT Next Visit Plan Review cane and isometrics. Progresss AAROM and HEP exercises as tolerated   PT Home Exercise Plan table slides/ AAROM flexion and ER.    Consulted and Agree with Plan of Care Patient        Problem List Patient Active Problem List   Diagnosis Date Noted  . Eczema, dyshidrotic 06/19/2015  . Localized edema 06/19/2015  . 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 Allergy history, radiographic dye 11/21/2014  . Metastases to the liver ([place]) 07/17/2014  . Routine general medical examination at a health care facility 07/10/2014  . Situational anxiety 04/18/2014  . Severe obesity (BMI >= 40) ([place]) 01/15/2014  . Coronary artery calcification seen on CAT scan 12/25/2013  . Atherosclerosis of aorta ([place]) 12/02/2013  . Unspecified constipation 08/21/2013  . Breast cancer of lower-outer quadrant of left female breast ([place]) 05/09/2013  . Venous stasis dermatitis 03/22/2013  . Bone  metastasis ([place]) 01/31/2013  . Unspecified vitamin D deficiency  12/23/2012  . Low back pain 12/01/2012  . Lymphedema of upper extremity following lymphadenectomy 02/15/2012  . Spinal stenosis in cervical region 10/28/2011  . Cervical spondylitis with radiculitis ([place]) 10/28/2011  . Hyperlipidemia with target LDL less than 100 09/03/2011  . Type 2 diabetes, uncontrolled, with neuropathy ([place]) 09/03/2011  . Osteoporosis 09/03/2011  . Hypothyroidism 09/03/2011  . Generalized OA 09/03/2011    Dollene Cleveland, PT 09/10/2015, 1:18 PM  [facility] [address] [place], [place], [address] Phone: [phone]   Fax:  [phone]  Name: Christy Harrell MRN: [id number] Date of Birth: 1941/07/20

## 2015-09-10 NOTE — Patient Instructions (Signed)
Flexion (Passive)    Sitting upright, slide forearm forward along table, bending from the waist until a stretch is felt. Hold _10___ seconds. Repeat __10__ times. Do __3__ sessions per day.  Copyright  VHI. All rights reserved.   SHOULDER: External Rotation - Sitting (Table Top)    Use one arm to rotate other arm away from body. Keep elbow next to body. _10__ reps per set, _2__ sets per day, _3__ days per week Use towel or pillowcase under arm as needed.  Copyright  VHI. All rights reserved.

## 2015-09-10 NOTE — Progress Notes (Signed)
Subjective:  Patient ID: Christy Harrell, female    DOB: Feb 07, 1941  Age: 75 y.o. MRN: [id number]  CC: Rash; Hypothyroidism; and Headache   HPI Christy Harrell presents for follow-up-she complains of persistent rash and itching and says that she has patches over her arms and legs. She has not been using the prescription steroid cream and instead was using an over-the-counter low-dose, hydrocortisone cream. She takes doxepin at bedtime and says it does help some but not very much. She also complains of having a diffuse headache for about 2 weeks. She complains of mild nausea and a decreased appetite.  Outpatient Prescriptions Prior to Visit  Medication Sig Dispense Refill  . AFINITOR 10 MG tablet TAKE 1 TABLET BY MOUTH ONCE DAILY 28 tablet 2  . aspirin EC 81 MG tablet Take 1 tablet (81 mg total) by mouth daily.    Marland Kitchen atorvastatin (LIPITOR) 80 MG tablet Take 1 tablet (80 mg total) by mouth daily. 30 tablet 11  . docusate sodium (COLACE) 100 MG capsule Take 100 mg by mouth 2 (two) times daily as needed for constipation.    Marland Kitchen exemestane (AROMASIN) 25 MG tablet Take 1 tablet (25 mg total) by mouth daily. 30 tablet 2  . fentaNYL (DURAGESIC - DOSED MCG/HR) 25 MCG/HR patch Place 25 mcg onto the skin every 3 (three) days.    . furosemide (LASIX) 40 MG tablet Take 1 tablet (40 mg total) by mouth daily. 30 tablet 6  . ketoconazole (NIZORAL) 2 % cream Apply 1 application topically daily. 60 g 2  . LORazepam (ATIVAN) 0.5 MG tablet Take 15 minutes before MRI test; may repeat x 1 10 tablet 0  . lubiprostone (AMITIZA) 24 MCG capsule Take 1 capsule (24 mcg total) by mouth 2 (two) times daily with a meal. 180 capsule 1  . metFORMIN (GLUCOPHAGE) 500 MG tablet Take 1 tablet (500 mg total) by mouth daily. 90 tablet 1  . oxyCODONE (OXY IR/ROXICODONE) 5 MG immediate release tablet Take 1 tablet (5 mg total) by mouth every 4 (four) hours as needed for severe pain. Take 1-2 tablets every 4-6 hours as needed for  pain - fill on or after 04/09/15 100 tablet 0  . Polyethyl Glycol-Propyl Glycol (SYSTANE ULTRA OP) Place 1 drop into both eyes daily.    . polyethylene glycol powder (GLYCOLAX/MIRALAX) powder Take 255 g (1 Container total) by mouth daily. 255 g 3  . potassium chloride SA (K-DUR,KLOR-CON) 20 MEQ tablet Take 1 tablet (20 mEq total) by mouth 2 (two) times daily. 180 tablet 1  . predniSONE (DELTASONE) 50 MG tablet Take 13 hours, 7 hours, and 1 hour prior to CT scan. 3 tablet 0  . pregabalin (LYRICA) 75 MG capsule Take 1 capsule (75 mg total) by mouth 2 (two) times daily. Not taking 60 capsule 5  . diclofenac sodium (VOLTAREN) 1 % GEL Apply 2 g topically 4 (four) times daily. 2 Tube 2  . doxepin (SINEQUAN) 25 MG capsule Take 1 capsule (25 mg total) by mouth at bedtime as needed. 90 capsule 1  . enalapril (VASOTEC) 10 MG tablet 10 mg daily.  2  . ezetimibe (ZETIA) 10 MG tablet Take 1 tablet (10 mg total) by mouth daily. 90 tablet 3  . fluocinonide-emollient (LIDEX-E) 0.05 % cream Apply 1 application topically 2 (two) times daily. 60 g 2  . levothyroxine (SYNTHROID, LEVOTHROID) 150 MCG tablet Take 1 tablet (150 mcg total) by mouth daily. 90 tablet 1  . prochlorperazine (COMPAZINE) 5 MG tablet  Take 1 tablet (5 mg total) by mouth every 6 (six) hours as needed for nausea or vomiting. 90 tablet 6  . amoxicillin-clavulanate (AUGMENTIN) 875-125 MG tablet Take 1 tablet by mouth 2 (two) times daily. 28 tablet 0  . predniSONE (DELTASONE) 5 MG tablet Take 4 tablets with food 30 minutes before MRI. 12 tablet 0   No facility-administered medications prior to visit.    ROS Review of Systems  Constitutional: Positive for appetite change and fatigue. Negative for fever, chills, diaphoresis, activity change and unexpected weight change.  Eyes: Negative.   Respiratory: Negative.  Negative for apnea, cough, choking, chest tightness, shortness of breath, wheezing and stridor.   Cardiovascular: Negative.  Negative for  chest pain, palpitations and leg swelling.  Gastrointestinal: Positive for nausea. Negative for vomiting, abdominal pain, diarrhea, constipation and blood in stool.  Endocrine: Negative.   Genitourinary: Negative.  Negative for difficulty urinating.  Musculoskeletal: Positive for arthralgias. Negative for myalgias, back pain and neck pain.  Skin: Positive for rash. Negative for color change, pallor and wound.  Allergic/Immunologic: Negative.   Neurological: Positive for headaches. Negative for dizziness, tremors, seizures, speech difficulty, weakness, light-headedness and numbness.  Hematological: Negative.  Negative for adenopathy. Does not bruise/bleed easily.  Psychiatric/Behavioral: Negative.     Objective:  BP 130/80 mmHg  Pulse 86  Temp(Src) 97.9 F (36.6 C) (Oral)  Resp 16  Ht 5' (1.524 m)  Wt 170 lb (77.111 kg)  BMI 33.20 kg/m2  SpO2 99%  BP Readings from Last 3 Encounters:  09/09/15 130/80  08/27/15 134/57  06/27/15 143/69    Wt Readings from Last 3 Encounters:  09/09/15 170 lb (77.111 kg)  06/27/15 170 lb 14.4 oz (77.52 kg)  06/19/15 174 lb (78.926 kg)    Physical Exam  Constitutional: She is oriented to person, place, and time. She appears well-developed and well-nourished. No distress.  HENT:  Mouth/Throat: Oropharynx is clear and moist. No oropharyngeal exudate.  Eyes: Conjunctivae are normal. Right eye exhibits no discharge. Left eye exhibits no discharge. No scleral icterus.  Neck: Normal range of motion. Neck supple. No JVD present. No tracheal deviation present. No thyromegaly present.  Cardiovascular: Normal rate, regular rhythm, normal heart sounds and intact distal pulses.  Exam reveals no gallop and no friction rub.   No murmur heard. Pulmonary/Chest: Effort normal and breath sounds normal. No stridor. No respiratory distress. She has no wheezes. She has no rales. She exhibits no tenderness.  Abdominal: Soft. Bowel sounds are normal. She exhibits no  distension and no mass. There is no tenderness. There is no rebound and no guarding.  Musculoskeletal: Normal range of motion. She exhibits no edema or tenderness.  Lymphadenopathy:    She has no cervical adenopathy.  Neurological: She is alert and oriented to person, place, and time. She has normal reflexes. She displays normal reflexes. No cranial nerve deficit. She exhibits normal muscle tone. Coordination normal.  Skin: Skin is warm, dry and intact. Rash noted. No purpura noted. Rash is not macular, not papular, not maculopapular, not nodular, not pustular, not vesicular and not urticarial. She is not diaphoretic. No pallor.  She has excoriated, eczematous patches on her upper arms and lower extremities. She also has one on the middle of her abdomen.  Psychiatric: She has a normal mood and affect. Her behavior is normal. Judgment and thought content normal.  Vitals reviewed.   Lab Results  Component Value Date   WBC 5.7 09/09/2015   HGB 12.4 09/09/2015  HCT 37.8 09/09/2015   PLT 356.0 09/09/2015   GLUCOSE 159* 08/27/2015   CHOL 272* 02/20/2015   TRIG 95.0 02/20/2015   HDL 58.70 02/20/2015   LDLDIRECT 160.0 03/16/2013   LDLCALC 194* 02/20/2015   ALT 10 09/09/2015   AST 19 09/09/2015   NA 142 08/27/2015   K 3.8 08/27/2015   CL 106 06/19/2015   CREATININE 0.8 08/27/2015   BUN 14.8 08/27/2015   CO2 26 08/27/2015   TSH 10.05* 09/09/2015   HGBA1C 7.1* 06/19/2015   MICROALBUR 7.3* 10/19/2014    Mr Liver Wo Contrast  07/12/2015  CLINICAL DATA:  Subsequent evaluation of a 75 year old female with history of breast cancer with known liver metastasis. Followup evaluation. EXAM: MRI ABDOMEN WITHOUT CONTRAST TECHNIQUE: Multiplanar multisequence MR imaging was performed without the administration of intravenous contrast. COMPARISON:  MRI of the abdomen 02/25/2015. FINDINGS: Comment: Study is significantly limited by lack of IV gadolinium for assessment of visceral and/or vascular lesions.  Lower chest:  Unremarkable. Hepatobiliary: As done on the prior study, hepatic lesions on today's examination were all measured from the axial T2 respiratory triggered fat saturated images (series 6 (series 4 was not utilized secondary to excessive respiratory motion)). These lesions all appear essentially unchanged compared to the prior examination. Specific examples include the dominant lesion in segment 2 (image 21 of series 6), which currently measures 6.1 x 4.2 cm (previously 6.1 x 4.3 cm), and the other a large lesion in the right lobe of the liver between segments 5 and 6 (image 26 of series 6) which currently measures 4.0 x 3.2 cm (previously 3.8 x 3.4 cm). No new hepatic lesions are otherwise noted. No intra or extrahepatic biliary ductal dilatation. Gallbladder is largely decompressed, but otherwise unremarkable in appearance. Pancreas: No pancreatic mass. No pancreatic ductal dilatation. No pancreatic or peripancreatic fluid or inflammatory changes. Spleen: Unremarkable. Adrenals/Urinary Tract: Sub cm lesion in the medial aspect of the interpolar region of the left kidney is T1 hypointense and T2 hyperintense, similar to the prior study, incompletely characterized on today's noncontrast examination but likely a small cyst. Right kidney bilateral adrenal glands are normal in appearance. No hydroureteronephrosis in the visualized portions of the abdomen. Stomach/Bowel: Visualized portions are unremarkable. Vascular/Lymphatic: No definite aneurysm in the visualized abdominal vasculature. No lymphadenopathy noted in the abdomen. Other: No significant volume of ascites in the visualized peritoneal cavity. Musculoskeletal: Postoperative changes in the right breast with architectural distortion and chronic postoperative fluid collection, similar to the prior examination, presumably a small seroma. Healed fracture of the posterior right eighth rib again noted. IMPRESSION: 1. Previously noted hepatic metastatic  disease is stable, as above. No new hepatic lesions are noted. 2. Additional incidental findings, as above, similar prior studies. Electronically Signed   By: Vinnie Langton M.D.   On: 07/12/2015 14:44    Assessment & Plan:   Shulonda was seen today for rash, hypothyroidism and headache.  Diagnoses and all orders for this visit:  Metastases to the liver [facility])- her liver enzymes are normal, this does not appear to be the cause for her itching. -     Hepatic function panel; Future -     CBC with Differential/Platelet; Future  Other specified hypothyroidism - her TSH is elevated so I have increased her Synthroid dose. -     TSH; Future -     CBC with Differential/Platelet; Future -     levothyroxine (SYNTHROID) 175 MCG tablet; Take 1 tablet (175 mcg total) by mouth daily before  breakfast.  Eczema, dyshidrotic- I have asked her to use the topical steroid cream as directed, will also increase the dose on her doxepin to control the itching. -     Discontinue: fluocinonide-emollient (LIDEX-E) 0.05 % cream; Apply 1 application topically 2 (two) times daily. -     doxepin (SINEQUAN) 25 MG capsule; Take 2 capsules (50 mg total) by mouth at bedtime as needed.  Other headache syndrome- I have ordered an MRI of her brain to see if she has metastatic disease. -     MR Brain Wo Contrast; Future  Breast cancer of lower-outer quadrant of left female breast ([place]) -     MR Brain Wo Contrast; Future  Need for 23-polyvalent pneumococcal polysaccharide vaccine -     Pneumococcal polysaccharide vaccine 23-valent greater than or equal to 2yo subcutaneous/IM   I have discontinued Ms. Halperin's diclofenac sodium, ezetimibe, prochlorperazine, enalapril, fluocinonide-emollient, levothyroxine, amoxicillin-clavulanate, and enalapril. I have also changed her doxepin. Additionally, I am having her start on levothyroxine. Lastly, I am having her maintain her Polyethyl Glycol-Propyl Glycol (SYSTANE ULTRA OP),  docusate sodium, aspirin EC, polyethylene glycol powder, lubiprostone, pregabalin, potassium chloride SA, LORazepam, atorvastatin, exemestane, predniSONE, ketoconazole, furosemide, metFORMIN, AFINITOR, fentaNYL, and oxyCODONE.  Meds ordered this encounter  Medications  . DISCONTD: enalapril (VASOTEC) 10 MG tablet    Sig:     Refill:  2  . DISCONTD: fluocinonide-emollient (LIDEX-E) 0.05 % cream    Sig: Apply 1 application topically 2 (two) times daily.    Dispense:  60 g    Refill:  2  . doxepin (SINEQUAN) 25 MG capsule    Sig: Take 2 capsules (50 mg total) by mouth at bedtime as needed.    Dispense:  180 capsule    Refill:  1  . levothyroxine (SYNTHROID) 175 MCG tablet    Sig: Take 1 tablet (175 mcg total) by mouth daily before breakfast.    Dispense:  90 tablet    Refill:  1     Follow-up: Return in about 3 months (around 12/08/2015).  [name], MD

## 2015-09-11 ENCOUNTER — Other Ambulatory Visit: Payer: Self-pay | Admitting: Oncology

## 2015-09-11 DIAGNOSIS — Z853 Personal history of malignant neoplasm of breast: Secondary | ICD-10-CM

## 2015-09-12 ENCOUNTER — Encounter: Payer: Medicare Other | Admitting: Physical Therapy

## 2015-09-12 ENCOUNTER — Ambulatory Visit (HOSPITAL_COMMUNITY)
Admission: RE | Admit: 2015-09-12 | Discharge: 2015-09-12 | Disposition: A | Discharge status: Home / Self Care | Payer: Medicare Other | Source: Ambulatory Visit | Attending: Cardiovascular Disease | Admitting: Cardiovascular Disease

## 2015-09-12 DIAGNOSIS — R609 Edema, unspecified: Secondary | ICD-10-CM | POA: Diagnosis not present

## 2015-09-12 DIAGNOSIS — E785 Hyperlipidemia, unspecified: Secondary | ICD-10-CM | POA: Insufficient documentation

## 2015-09-12 DIAGNOSIS — E119 Type 2 diabetes mellitus without complications: Secondary | ICD-10-CM | POA: Insufficient documentation

## 2015-09-12 DIAGNOSIS — I10 Essential (primary) hypertension: Secondary | ICD-10-CM | POA: Diagnosis not present

## 2015-09-17 ENCOUNTER — Telehealth: Payer: Self-pay

## 2015-09-17 ENCOUNTER — Ambulatory Visit: Payer: Medicare Other | Attending: Orthopedic Surgery

## 2015-09-17 DIAGNOSIS — R29898 Other symptoms and signs involving the musculoskeletal system: Secondary | ICD-10-CM | POA: Diagnosis present

## 2015-09-17 DIAGNOSIS — M12812 Other specific arthropathies, not elsewhere classified, left shoulder: Secondary | ICD-10-CM

## 2015-09-17 DIAGNOSIS — Z9181 History of falling: Secondary | ICD-10-CM | POA: Insufficient documentation

## 2015-09-17 DIAGNOSIS — Z7409 Other reduced mobility: Secondary | ICD-10-CM | POA: Diagnosis present

## 2015-09-17 DIAGNOSIS — M75102 Unspecified rotator cuff tear or rupture of left shoulder, not specified as traumatic: Secondary | ICD-10-CM

## 2015-09-17 DIAGNOSIS — M25512 Pain in left shoulder: Secondary | ICD-10-CM | POA: Diagnosis present

## 2015-09-17 DIAGNOSIS — M12512 Traumatic arthropathy, left shoulder: Secondary | ICD-10-CM | POA: Insufficient documentation

## 2015-09-17 DIAGNOSIS — Z789 Other specified health status: Secondary | ICD-10-CM

## 2015-09-17 NOTE — Therapy (Signed)
[place], [place], [address] Phone: [phone]   Fax:  [phone]  Physical Therapy Treatment  Patient Details  Name: Christy Harrell MRN: [id number] Date of Birth: Aug 06, 1941 Referring Provider: Tania Ade  Encounter Date: 09/17/2015      PT End of Session - 09/17/15 1028    Visit Number 5   Number of Visits 20   Date for PT Re-Evaluation 10/20/15   PT Start Time 1020   PT Stop Time 1110   PT Time Calculation (min) 50 min   Activity Tolerance Patient tolerated treatment well   Behavior During Therapy [facility] for tasks assessed/performed      Past Medical History  Diagnosis Date  . Club foot   . Arthritis   . Thyroid disease   . Hyperlipidemia   . Osteoporosis   . Hypertension   . GERD (gastroesophageal reflux disease)     watches diet  . Neuromuscular disorder (HCC)     lt arm numb sometimes  . Wears glasses   . History of radiation therapy 02/22/13- 03/13/13    left proximal humerus 3500 cGy 14 sessions  . Lymphedema     right arm  . Diabetes mellitus     metformin  . Cancer [facility])     right breast  . Metastasis from malignant tumor of breast ([place])     left humerous  . Metastasis from malignant tumor of breast [facility])     liver    Past Surgical History  Procedure Laterality Date  . Cesarean section    . Thyroidectomy  2002  . Foot surgery      as a child for club foot  . Breast surgery  2012    rt lump-16 nodes-in [place]  . Cesarean section    . Colonoscopy    . Dilation and curettage of uterus    . Humerus im nail Left 12/27/2012    Procedure: INTRAMEDULLARY (IM) NAIL HUMERAL LEFT PATHOLOGIC FRACTURE;  Surgeon: Nita Sells, MD;  Location: [facility];  Service: Orthopedics;  Laterality: Left;    There were no vitals filed for this visit.  Visit Diagnosis:  Rotator cuff tear arthropathy, left  Pain in joint of left shoulder  Weakness of left  arm  Impaired mobility and ADLs  Patient had 2 or more falls in past year      Subjective Assessment - 09/17/15 1017    Subjective Pt c/o of L hip  "crook" today, no pain. L shoulder is "doing pretty good".    Currently in Pain? No/denies   Pain Score 0-No pain   Pain Location Shoulder   Pain Orientation Left   Pain Descriptors / Indicators Aching   Pain Type Chronic pain   Pain Onset More than a month ago            [facility] PT Assessment - 09/17/15 0001    AROM   Left Shoulder Flexion 45 Degrees   Left Shoulder ABduction 55 Degrees   PROM   Left Shoulder Flexion 135 Degrees   Left Shoulder ABduction 115 Degrees   Left Shoulder Internal Rotation 55 Degrees   Left Shoulder External Rotation 45 Degrees   Strength   Left Shoulder Flexion 2-/5   Left Shoulder ABduction 2-/5   Left Shoulder Internal Rotation 3/5   Left Shoulder External Rotation 2-/5  [facility] Adult PT Treatment/Exercise - 09/17/15 0001    Self-Care   Posture adjusted pt's cane to better fit pt. and educated on how to adjust height properly.    Shoulder Exercises: Supine   External Rotation AAROM;20 reps  with cane   Flexion AROM;Left;20 reps   Other Supine Exercises supine (chest press) punches x 20 AROM    Shoulder Exercises: Seated   Flexion AAROM;10 reps  table slides, HEP   Shoulder Exercises: Standing   Other Standing Exercises isometrics all planes-  x 20 reps, 5 sec hold   HEP   Modalities   Modalities Moist Heat   Moist Heat Therapy   Number Minutes Moist Heat 10 Minutes   Moist Heat Location Shoulder  L   Manual Therapy   Manual Therapy Joint mobilization;Soft tissue mobilization;Passive ROM   Joint Mobilization gentle joint distraction , Inf- post glide grade 1-3    Soft tissue mobilization STM to anterior shoulder, biceps   Passive ROM PROM L shoulder all directions                 PT Education - 09/17/15 1027    Education provided Yes    Education Details Cotninue HEP.    Person(s) Educated Patient   Methods Explanation   Comprehension Verbalized understanding          PT Short Term Goals - 09/17/15 1028    PT SHORT TERM GOAL #1   Title Pt will be I with initial HEP for continued strengthening and mobility by 09/26/15   Time 4   Period Weeks   Status Achieved   PT SHORT TERM GOAL #2   Title R shoulder PROM will improve by 10 degrees pain-free for improved overhead reaching activities by 09/26/15.   Time 4   Period Weeks   Status Achieved           PT Long Term Goals - 09/17/15 1029    PT LONG TERM GOAL #1   Title Pt will be indep with advanced HEP by 10/20/15.    Time 8   Period Weeks   Status On-going   PT LONG TERM GOAL #2   Title FOTO score will improve from 59% limited to 39% to demo improved function and mobility by 10/20/15.    Time 8   Period Weeks   Status On-going   PT LONG TERM GOAL #3   Title Pt will be able to lift 1# overhead x 10 reps without pain in order to return to pain-free functional activities for lifting overhead by 10/20/15.   Time 8   Period Weeks   Status On-going               Plan - 09/17/15 1024    Clinical Impression Statement Increased isometric reps to 20 times without pain or difficutly. Progressed to 20 times for HEP too.  Pt reports compliance with HEP including new table slides, and pt demonstrated good form. Pt able to perform AROM shoulder flexion in supine. Pt demonstrated good improvements in PROM as measured by objective measures today.  Both STGs achieved. Pt would benefit from progression to AAROM in standing and AROM ther ex.     PT Next Visit Plan Progress to AAROM in standing and AROM ther ex, pain - free.    Consulted and Agree with Plan of Care Patient        Problem List Patient Active Problem List   Diagnosis Date Noted  . Eczema, dyshidrotic 06/19/2015  .  Localized edema 06/19/2015  . 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 Allergy history, radiographic dye 11/21/2014  . Metastases to the liver ([place]) 07/17/2014  . Routine general medical examination at a health care facility 07/10/2014  . Situational anxiety 04/18/2014  . Severe obesity (BMI >= 40) ([place]) 01/15/2014  . Coronary artery calcification seen on CAT scan 12/25/2013  . Atherosclerosis of aorta ([place]) 12/02/2013  . Unspecified constipation 08/21/2013  . Breast cancer of lower-outer quadrant of left female breast ([place]) 05/09/2013  . Venous stasis dermatitis 03/22/2013  . Bone metastasis ([place]) 01/31/2013  . Unspecified vitamin D deficiency 12/23/2012  . Low back pain 12/01/2012  . Lymphedema of upper extremity following lymphadenectomy 02/15/2012  . Spinal stenosis in cervical region 10/28/2011  . Cervical spondylitis with radiculitis ([place]) 10/28/2011  . Hyperlipidemia with target LDL less than 100 09/03/2011  . Type 2 diabetes, uncontrolled, with neuropathy ([place]) 09/03/2011  . Osteoporosis 09/03/2011  . Hypothyroidism 09/03/2011  . Generalized OA 09/03/2011    Christy Harrell, PT 09/17/2015, 11:04 AM  [facility] [address] [place], [place], [address] Phone: [phone]   Fax:  [phone]  Name: Christy Harrell MRN: [id number] Date of Birth: 12/04/1940

## 2015-09-17 NOTE — Telephone Encounter (Signed)
PA initiated for Doxepin, key [id number]

## 2015-09-18 ENCOUNTER — Ambulatory Visit
Admission: RE | Admit: 2015-09-18 | Discharge: 2015-09-18 | Disposition: A | Discharge status: Home / Self Care | Payer: Medicare Other | Source: Ambulatory Visit | Attending: Oncology | Admitting: Oncology

## 2015-09-18 DIAGNOSIS — Z853 Personal history of malignant neoplasm of breast: Secondary | ICD-10-CM

## 2015-09-18 NOTE — Telephone Encounter (Signed)
PA approved 08/09/2015 - 08/09/2016. Pharmacy advised

## 2015-09-19 ENCOUNTER — Telehealth: Payer: Self-pay | Admitting: Oncology

## 2015-09-19 ENCOUNTER — Other Ambulatory Visit: Payer: Medicare Other

## 2015-09-19 ENCOUNTER — Other Ambulatory Visit (HOSPITAL_BASED_OUTPATIENT_CLINIC_OR_DEPARTMENT_OTHER): Payer: Medicare Other

## 2015-09-19 ENCOUNTER — Ambulatory Visit (HOSPITAL_BASED_OUTPATIENT_CLINIC_OR_DEPARTMENT_OTHER): Payer: Medicare Other | Admitting: Oncology

## 2015-09-19 ENCOUNTER — Ambulatory Visit: Payer: Medicare Other

## 2015-09-19 ENCOUNTER — Ambulatory Visit (HOSPITAL_BASED_OUTPATIENT_CLINIC_OR_DEPARTMENT_OTHER): Payer: Medicare Other

## 2015-09-19 VITALS — BP 120/63 | HR 79 | Temp 98.5°F | Resp 18 | Ht 60.0 in | Wt 174.7 lb

## 2015-09-19 DIAGNOSIS — C50512 Malignant neoplasm of lower-outer quadrant of left female breast: Secondary | ICD-10-CM

## 2015-09-19 DIAGNOSIS — C787 Secondary malignant neoplasm of liver and intrahepatic bile duct: Secondary | ICD-10-CM

## 2015-09-19 DIAGNOSIS — C7951 Secondary malignant neoplasm of bone: Secondary | ICD-10-CM

## 2015-09-19 DIAGNOSIS — I890 Lymphedema, not elsewhere classified: Secondary | ICD-10-CM | POA: Diagnosis not present

## 2015-09-19 DIAGNOSIS — Z5111 Encounter for antineoplastic chemotherapy: Secondary | ICD-10-CM

## 2015-09-19 DIAGNOSIS — Z79811 Long term (current) use of aromatase inhibitors: Secondary | ICD-10-CM

## 2015-09-19 DIAGNOSIS — M810 Age-related osteoporosis without current pathological fracture: Secondary | ICD-10-CM

## 2015-09-19 LAB — CBC WITH DIFFERENTIAL/PLATELET
BASO%: 1.5 % (ref 0.0–2.0)
Basophils Absolute: 0.1 10*3/uL (ref 0.0–0.1)
EOS%: 3.7 % (ref 0.0–7.0)
Eosinophils Absolute: 0.2 10*3/uL (ref 0.0–0.5)
HCT: 34.2 % — ABNORMAL LOW (ref 34.8–46.6)
HGB: 11 g/dL — ABNORMAL LOW (ref 11.6–15.9)
LYMPH%: 33.3 % (ref 14.0–49.7)
MCH: 25.6 pg (ref 25.1–34.0)
MCHC: 32.3 g/dL (ref 31.5–36.0)
MCV: 79.4 fL — ABNORMAL LOW (ref 79.5–101.0)
MONO#: 0.5 10*3/uL (ref 0.1–0.9)
MONO%: 11 % (ref 0.0–14.0)
NEUT#: 2.2 10*3/uL (ref 1.5–6.5)
NEUT%: 50.5 % (ref 38.4–76.8)
Platelets: 177 10*3/uL (ref 145–400)
RBC: 4.31 10*6/uL (ref 3.70–5.45)
RDW: 16.2 % — ABNORMAL HIGH (ref 11.2–14.5)
WBC: 4.4 10*3/uL (ref 3.9–10.3)
lymph#: 1.5 10*3/uL (ref 0.9–3.3)

## 2015-09-19 LAB — COMPREHENSIVE METABOLIC PANEL
ALT: 11 U/L (ref 0–55)
AST: 18 U/L (ref 5–34)
Albumin: 2.9 g/dL — ABNORMAL LOW (ref 3.5–5.0)
Alkaline Phosphatase: 89 U/L (ref 40–150)
Anion Gap: 9 mEq/L (ref 3–11)
BILIRUBIN TOTAL: 0.41 mg/dL (ref 0.20–1.20)
BUN: 15.3 mg/dL (ref 7.0–26.0)
CO2: 25 meq/L (ref 22–29)
CREATININE: 0.8 mg/dL (ref 0.6–1.1)
Calcium: 9.1 mg/dL (ref 8.4–10.4)
Chloride: 109 mEq/L (ref 98–109)
EGFR: 79 mL/min/{1.73_m2} — ABNORMAL LOW (ref >90)
GLUCOSE: 121 mg/dL (ref 70–140)
Potassium: 3.7 mEq/L (ref 3.5–5.1)
SODIUM: 143 meq/L (ref 136–145)
TOTAL PROTEIN: 6.9 g/dL (ref 6.4–8.3)

## 2015-09-19 MED ORDER — METOCLOPRAMIDE HCL 5 MG PO TABS: 5.0000 mg | ORAL_TABLET | Freq: Three times a day (TID) | ORAL | Status: DC | PRN

## 2015-09-19 MED ORDER — DENOSUMAB 120 MG/1.7ML ~~LOC~~ SOLN
120.0000 mg | Freq: Once | SUBCUTANEOUS | Status: AC
  Administered 2015-09-19: 120 mg via SUBCUTANEOUS
  Filled 2015-09-19: qty 1.7

## 2015-09-19 MED ORDER — FULVESTRANT 250 MG/5ML IM SOLN
500.0000 mg | Freq: Once | INTRAMUSCULAR | Status: AC
  Administered 2015-09-19: 500 mg via INTRAMUSCULAR
  Filled 2015-09-19: qty 10

## 2015-09-19 MED ORDER — PREDNISONE 50 MG PO TABS: ORAL_TABLET | ORAL | Status: DC

## 2015-09-19 MED ORDER — EVEROLIMUS 10 MG PO TABS: 10.0000 mg | ORAL_TABLET | Freq: Every day | ORAL | Status: DC

## 2015-09-19 MED ORDER — EXEMESTANE 25 MG PO TABS: 25.0000 mg | ORAL_TABLET | Freq: Every day | ORAL | Status: DC

## 2015-09-19 NOTE — Telephone Encounter (Signed)
Scheduled patient appt per pof, avs report printed.  °

## 2015-09-19 NOTE — Progress Notes (Signed)
ID: [name]   DOB: Aug 15, 1940  MR#: [id number]  QQP#:[id number]  PCP: [name], MD GYN:  SU:  OTHER MD:  Alysia Penna, Frederik Pear, Faustino Congress  CC: Breast cancer stage IV  TREATMENT: exemestane, everolimus, fulvestrant, and denosumab  BREAST CANCER HISTORY: From the original intake note:  The patient had routine screening mammography 10/16/2010 showing a lobulated mass in the right subareolar region measuring up to 5.5 cm. The Left breast showed some central microcalcifications. Biopsy of the Right breast mass on 10/28/2010 showed an invasive ductal carcinoma, grade 2, estrogen receptor positive (Allred score 8), progesterone receptor positive (Allred score 5), with an equivocal HER-2. The Left breast area of microcalcifications was biopsied at the same time, and was read as suspicious for DCIS.  On 11/17/2010 the patient underwent Right lumpectomy and axillary lymph node dissection for what proved to be a 3 cm invasive ductal carcinoma, grade 2, with some papillary and mucinous features. One of 16 lymph nodes was involved. FISH showed no HER-2 amplification. Left breast biopsy was benign.  The patient had an Oncotype sent, with a score of 27, predicting a risk of distant recurrence after 5 years of tamoxifen in the 18% range. With this intermediate result, the decision was made not to proceed with chemotherapy, since the patient is the sole caregiver to her very elderly mother. Instead the patient proceeded to radiation treatment which was completed 03/06/2011. She started anastrozole at that point. Her subsequent history is as detailed below   INTERVAL HISTORY: Christy Harrell returns today for follow up of her metastatic breast cancer. She is receiving exemestane and everolimus. There has been no mucositis or evidence of pneumonitis so far. She does not describe herself is unusually fatigued. As far as the exemestane is concerns hot flashes  are a minor nuisance. They don't wake her up at night. Vaginal dryness is not a concern. She obtains both drugs at a very good price.  She is also on fulvestrant and Denosumab monthly. Of course she does not like the shots but aside from that she denies any side effects from those medications.  REVIEW OF SYSTEMS: Christy Harrell Has lost a fair amount of weight going back to February 2015 when she weighed 216 pounds. In February 2016 she was done 285 pounds. Today's way is 175 pounds.her pain is well-controlled on her current medications which include a fentanyl patch.She has occasional headaches and there is an order from her primary care physician for a brain MRI, which I do not see scheduled at this point. The headaches are mild and intermittent. They resolve "on their own". She tells me her sugars are "okay". The right upper extremity lymphedema is unchanged. She saw Dr. Tamera Punt recently for her shoulder pain and was told she has significant rotator cuff problems and if we have does not work she may need surgical intervention. She denies visual changes, cough, phlegm production, or pleurisy. She has some GI symptoms suggestive of gastric atony. She denies constipation, diarrhea, melena, or bright red blood per rectum. A detailed review of systems today was otherwise stable  PAST MEDICAL HISTORY: Past Medical History  Diagnosis Date  . Club foot   . Arthritis   . Thyroid disease   . Hyperlipidemia   . Osteoporosis   . Hypertension   . GERD (gastroesophageal reflux disease)     watches diet  . Neuromuscular disorder (HCC)     lt arm numb sometimes  . Wears glasses   .  History of radiation therapy 02/22/13- 03/13/13    left proximal humerus 3500 cGy 14 sessions  . Lymphedema     right arm  . Diabetes mellitus     metformin  . Cancer [facility])     right breast  . Metastasis from malignant tumor of breast ([place])     left humerous  . Metastasis from malignant tumor of breast [facility])     liver     PAST SURGICAL HISTORY: Past Surgical History  Procedure Laterality Date  . Cesarean section    . Thyroidectomy  2002  . Foot surgery      as a child for club foot  . Breast surgery  2012    rt lump-16 nodes-in [place]  . Cesarean section    . Colonoscopy    . Dilation and curettage of uterus    . Humerus im nail Left 12/27/2012    Procedure: INTRAMEDULLARY (IM) NAIL HUMERAL LEFT PATHOLOGIC FRACTURE;  Surgeon: Nita Sells, MD;  Location: [facility];  Service: Orthopedics;  Laterality: Left;    FAMILY HISTORY Family History  Problem Relation Age of Onset  . Cancer Neg Hx   . Arthritis Neg Hx   . Heart disease Neg Hx   . Hyperlipidemia Neg Hx   . Hypertension Neg Hx   . Dementia Mother   . Hearing loss Mother    The patient's father died from unknown causes. The patient's mother is alive at age 5. The patient was a single child. There is no history of breast or ovarian cancer in the family to her knowledge.  GYNECOLOGIC HISTORY: Menarche age 55, menopause in her early 86s. The patient is GX P1, first pregnancy to term age 12. She did not take hormone replacement  SOCIAL HISTORY: The patient grew up in [place], attended [employer], then moved to [place] where she lived about 40 years. 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 Her mother, Christy Harrell away at 100, on Aug 25, 2014. The patient's daughter Christy Harrell, 63, is disabled secondary to pulmonary hypertension. She can be reached at [phone]. The patient has 2 grandchildren, Christy Harrell who works at a Scientist, physiological business and is 75 years old, in general, 40, completing high school.   ADVANCED DIRECTIVES:not in place  HEALTH MAINTENANCE: Social History  Substance Use Topics  . Smoking status: Former Smoker    Quit date: 12/23/1974  . Smokeless tobacco: Never Used  . Alcohol Use: No     Colonoscopy:  PAP:    Bone density:  2012, on ibandronate  chronically  Lipid panel: UTD  Allergies  Allergen Reactions  . Bydureon [Exenatide] Rash  . Gadolinium Derivatives Other (See Comments)    Pt began having chest numbness/tingling , stated her heart felt funny, had to take her to ED for EKG per RN   CAP  . Enalapril Rash    Current Outpatient Prescriptions  Medication Sig Dispense Refill  . AFINITOR 10 MG tablet TAKE 1 TABLET BY MOUTH ONCE DAILY 28 tablet 2  . aspirin EC 81 MG tablet Take 1 tablet (81 mg total) by mouth daily.    Marland Kitchen atorvastatin (LIPITOR) 80 MG tablet Take 1 tablet (80 mg total) by mouth daily. 30 tablet 11  . docusate sodium (COLACE) 100 MG capsule Take 100 mg by mouth 2 (two) times daily as needed for constipation.    Marland Kitchen doxepin (SINEQUAN) 25 MG capsule Take 2 capsules (50 mg total) by mouth  at bedtime as needed. 180 capsule 1  . exemestane (AROMASIN) 25 MG tablet Take 1 tablet (25 mg total) by mouth daily. 30 tablet 2  . fentaNYL (DURAGESIC - DOSED MCG/HR) 25 MCG/HR patch Place 25 mcg onto the skin every 3 (three) days.    . fluocinonide-emollient (LIDEX-E) 0.05 % cream Apply 1 application topically 2 (two) times daily. 60 g 2  . furosemide (LASIX) 40 MG tablet Take 1 tablet (40 mg total) by mouth daily. 30 tablet 6  . ketoconazole (NIZORAL) 2 % cream Apply 1 application topically daily. 60 g 2  . levothyroxine (SYNTHROID) 175 MCG tablet Take 1 tablet (175 mcg total) by mouth daily before breakfast. 90 tablet 1  . LORazepam (ATIVAN) 0.5 MG tablet Take 15 minutes before MRI test; may repeat x 1 10 tablet 0  . lubiprostone (AMITIZA) 24 MCG capsule Take 1 capsule (24 mcg total) by mouth 2 (two) times daily with a meal. 180 capsule 1  . metFORMIN (GLUCOPHAGE) 500 MG tablet Take 1 tablet (500 mg total) by mouth daily. 90 tablet 1  . oxyCODONE (OXY IR/ROXICODONE) 5 MG immediate release tablet Take 1 tablet (5 mg total) by mouth every 4 (four) hours as needed for severe pain. Take 1-2 tablets every 4-6 hours as needed for pain -  fill on or after 04/09/15 100 tablet 0  . Polyethyl Glycol-Propyl Glycol (SYSTANE ULTRA OP) Place 1 drop into both eyes daily.    . polyethylene glycol powder (GLYCOLAX/MIRALAX) powder Take 255 g (1 Container total) by mouth daily. 255 g 3  . potassium chloride SA (K-DUR,KLOR-CON) 20 MEQ tablet Take 1 tablet (20 mEq total) by mouth 2 (two) times daily. 180 tablet 1  . predniSONE (DELTASONE) 50 MG tablet Take 13 hours, 7 hours, and 1 hour prior to CT scan. 3 tablet 0  . pregabalin (LYRICA) 75 MG capsule Take 1 capsule (75 mg total) by mouth 2 (two) times daily. Not taking 60 capsule 5   No current facility-administered medications for this visit.   PHYSICAL EXAM: Elderly African American woman who appears stated age 51 Vitals:   09/19/15 1028  BP: 120/63  Pulse: 79  Temp: 98.5 F (36.9 C)  Resp: 18   Body mass index is 34.12 kg/(m^2).    ECOG FS: 2 Filed Weights   09/19/15 1028  Weight: 174 lb 11.2 oz (79.243 kg)   Sclerae unicteric, pupils round and equal Oropharynx clear and moist-- no thrush or other lesions No cervical or supraclavicular adenopathy Lungs no rales or rhonchi Heart regular rate and rhythm Abd soft, obese, nontender, positive bowel sounds MSK no focal spinal tenderness, grade 3 right upper extremity lymphedema Neuro: nonfocal, well oriented, pleasant and positive affect Breasts: the right breast is status post radiation and lumpectomy. In addition there is swelling related to the right sided lymphedema. There is absolutely no erythema. The right axilla is unremarkable. The left breast is benign.  LAB RESULTS: Lab Results  Component Value Date   WBC 5.7 09/09/2015   NEUTROABS 3.2 09/09/2015   HGB 12.4 09/09/2015   HCT 37.8 09/09/2015   MCV 79.1 09/09/2015   PLT 356.0 09/09/2015      Chemistry      Component Value Date/Time   NA 142 08/27/2015 1200   NA 142 06/19/2015 1156   K 3.8 08/27/2015 1200   K 3.8 06/19/2015 1156   CL 106 06/19/2015 1156    CL 105 01/23/2013 1337   CO2 26 08/27/2015 1200  CO2 26 06/19/2015 1156   BUN 14.8 08/27/2015 1200   BUN 11 06/19/2015 1156   CREATININE 0.8 08/27/2015 1200   CREATININE 0.81 06/19/2015 1156      Component Value Date/Time   CALCIUM 9.2 08/27/2015 1200   CALCIUM 9.1 06/19/2015 1156   ALKPHOS 80 09/09/2015 1140   ALKPHOS 90 08/27/2015 1200   AST 19 09/09/2015 1140   AST 17 08/27/2015 1200   ALT 10 09/09/2015 1140   ALT <9 08/27/2015 1200   BILITOT 0.5 09/09/2015 1140   BILITOT 0.42 08/27/2015 1200       Lab Results  Component Value Date   LABCA2 24 11/19/2011    STUDIES: Mm Diag Breast Tomo Bilateral  09/18/2015  CLINICAL DATA:  Patient has stage IV breast carcinoma. She underwent right lumpectomy in April 2012 had adjuvant radiation therapy and is currently on chemotherapy. Patient presents with swelling and firmness of the right breast. She also has diffuse swelling of the right arm consistent with left edema. EXAM: DIGITAL DIAGNOSTIC BILATERAL MAMMOGRAM WITH 3D TOMOSYNTHESIS AND CAD COMPARISON:  Previous exam(s). ACR Breast Density Category b: There are scattered areas of fibroglandular density. FINDINGS: On the right, the focal opacity in the surgical bed has decreased and is now mixed attenuation. There is architectural distortion consistent with postsurgical scarring. Well-defined fat necrosis lies along the anterior margin of this reflected by egg shell calcifications. The suspensory ligaments are thickened throughout the right breast. The skin is thickened most evident anteriorly and inferiorly. There are no new masses or other areas of architectural distortion. There are no suspicious calcifications. The skin thickening is similar to the prior 2 exams. On the left, architectural distortion in the upper breast reflecting a previous, by report, benign, surgical excision, has decreased in size from the most recent prior exam. There is associated dystrophic calcifications. A stable  smooth benign appearing mass is noted in the medial anterior breast likely a cyst. This has been stable for several years. There are no other masses. There are no other areas of architectural distortion. There are no suspicious left breast calcifications. Mammographic images were processed with CAD. IMPRESSION: The imaging appearance is consistent with benign postsurgical change and benign lymphedema with the skin thickening and suspensory ligament thickening similar to the recent prior studies. It should be noted that clinically, the appearance the breast could either be due to lymphedema or inflammatory breast carcinoma. RECOMMENDATION: Diagnostic mammography in 1 year, per standard post lumpectomy protocol, if this would alter clinical management. Consider punch biopsy of the skin if a diagnosis of inflammatory breast carcinoma would alter clinical management. I have discussed the findings and recommendations with the patient. Results were also provided in writing at the conclusion of the visit. If applicable, a reminder letter will be sent to the patient regarding the next appointment. BI-RADS CATEGORY  2: Benign. Electronically Signed   By: Lajean Manes M.D.   On: 09/18/2015 11:59     ASSESSMENT: 75 y.o.  [place] woman with stage IV [bone only] breast cancer as of May 2014  (1)  status post right lumpectomy and axillary lymph node dissection 11/17/2010 for a pT2 pN1, stage IIB invasive ductal carcinoma, grade 2, estrogen and progesterone receptor positive, HER-2 negative,   (2) Oncotype DX recurrence score of 27 predicting a risk of distant recurrence of 18% with 5 years of tamoxifen (intermediate score);   (3)  status post radiation completed July of 2012,   (4) started anastrozole July 2012, discontinued June 2014  with evidence of metastatic spread to bone  (5) Chronic lymphedema in the right upper extremity.  (6) pathologic fracture of the left humerus along apparent lytic lesion, with no  other bone lesions per bone scan 12/12/2012  (7) on 12/27/2012 underwent #1 intramedullary nail left pathologic proximal humerus fracture  #2 left shoulder rotator cuff repair  #3 left shoulder open bone biopsy with pathology showing metastatic breast adenocarcinoma, estrogen receptor 100% positive, progesterone receptor and HER-2 negative, with an MIB-1 of 26%. PET scan 01/13/2013 showed no other areas of disease   (8) status post 35 Gy to the left proximal humerus completed 03/13/2013(  (9) fulvestrant, started on 01/23/2013, ongoing  (10) zolendronic acid given monthly, first dose 01/23/2013; changed to denosumab as of August 2015 because of access problems, being given every three months  (11) multiple liver lesions noted on scans April 2015   (12) letrozole added May 2015, stopped 04/18/14 because of progression in her liver noted on a chest CT performed on 03/14/14  (13) everolimus and exemestane started 04/19/14  (a) MRI of the abdomen 07/12/15 showed stable disease   PLAN:   there is quite a bit going on [place]  But from a cancer point of view she is tolerating the everolimus with no mucositis or pneumonitis and she tolerates the fulvestrant without significant pain. She does not have hot flashes or significant vaginal dryness from the exemestane. We are doing the Denosumab every 3 months and I have strongly urged her to get herself a dentist. I am not sure this is covered by her insurance and I don't know how to get this for her otherwise.    she will see [place] again in a month and we will do some lab work before that visit. She will also have a repeat MRI of the abdomen prior to the visit. Recall that she is allergic to contrast and so I have refilled her prednisone premeds with regards to that.  Her TSH rose above 10 and Dr.Jones has already adjusted her Synthroid dose.  As far as her nausea is concerned , I have written for metoclopramide 5 mg to take before meals as needed.  She is having very mild headaches and  There is a concern regarding brain metastases , although the history is not strongly suggestive of this. Her primary care physician has a ready written for an MRI of the brain but I do not see that that has been scheduled yet.   the mammography shows significant swelling of the right breast and raises the question of possible inflammatory breast cancer. However there is no erythema whatsoever. What we are seeing is changes from prior radiation and lymphedema associated with her right upper extremity lymphedema. I don't see any evidence for local recurrence.   she knows to call for any problems that may develop before her next visit here.   Chauncey Cruel, MD  09/19/2015

## 2015-09-20 ENCOUNTER — Encounter: Payer: Medicare Other | Admitting: Physical Therapy

## 2015-09-20 ENCOUNTER — Other Ambulatory Visit: Payer: Self-pay | Admitting: Oncology

## 2015-09-20 DIAGNOSIS — C50512 Malignant neoplasm of lower-outer quadrant of left female breast: Secondary | ICD-10-CM

## 2015-09-20 MED ORDER — EVEROLIMUS 10 MG PO TABS: 10.0000 mg | ORAL_TABLET | Freq: Every day | ORAL | Status: DC

## 2015-09-20 NOTE — Telephone Encounter (Addendum)
-----   Message from Trula Slade, DPM sent at 09/19/2015  7:03 PM EST ----- Can you let her know that her venous study was normal, no blood clots and veins working good. Thanks  09/20/2015-LEFT MESSAGE as directed by pt, informing her that her venous doppler was normal and no blood clots.

## 2015-09-23 ENCOUNTER — Other Ambulatory Visit: Payer: Self-pay

## 2015-09-23 DIAGNOSIS — C7951 Secondary malignant neoplasm of bone: Secondary | ICD-10-CM

## 2015-09-23 DIAGNOSIS — C50512 Malignant neoplasm of lower-outer quadrant of left female breast: Secondary | ICD-10-CM

## 2015-09-23 DIAGNOSIS — C787 Secondary malignant neoplasm of liver and intrahepatic bile duct: Secondary | ICD-10-CM

## 2015-09-23 NOTE — Progress Notes (Signed)
[facility] called and requested that the MRI of the liver be changed to the MRI of the abdomen with and without contrast. Writer changed order and notified [facility].

## 2015-09-24 ENCOUNTER — Ambulatory Visit: Payer: Medicare Other | Admitting: Physical Therapy

## 2015-09-24 DIAGNOSIS — Z7409 Other reduced mobility: Secondary | ICD-10-CM

## 2015-09-24 DIAGNOSIS — M75102 Unspecified rotator cuff tear or rupture of left shoulder, not specified as traumatic: Principal | ICD-10-CM

## 2015-09-24 DIAGNOSIS — M25512 Pain in left shoulder: Secondary | ICD-10-CM

## 2015-09-24 DIAGNOSIS — M12512 Traumatic arthropathy, left shoulder: Secondary | ICD-10-CM | POA: Diagnosis not present

## 2015-09-24 DIAGNOSIS — R29898 Other symptoms and signs involving the musculoskeletal system: Secondary | ICD-10-CM

## 2015-09-24 DIAGNOSIS — Z9181 History of falling: Secondary | ICD-10-CM

## 2015-09-24 DIAGNOSIS — M12812 Other specific arthropathies, not elsewhere classified, left shoulder: Secondary | ICD-10-CM

## 2015-09-24 NOTE — Therapy (Signed)
[place], [place], [address] Phone: [phone]   Fax:  [phone]  Physical Therapy Treatment  Patient Details  Name: Christy Harrell MRN: [id number] Date of Birth: Jan 30, 1941 Referring Provider: Tania Ade  Encounter Date: 09/24/2015      PT End of Session - 09/24/15 1021    Visit Number 6   Number of Visits 20   Date for PT Re-Evaluation 10/20/15   PT Start Time [id number]   PT Stop Time 1109   PT Time Calculation (min) 54 min      Past Medical History  Diagnosis Date  . Club foot   . Arthritis   . Thyroid disease   . Hyperlipidemia   . Osteoporosis   . Hypertension   . GERD (gastroesophageal reflux disease)     watches diet  . Neuromuscular disorder (HCC)     lt arm numb sometimes  . Wears glasses   . History of radiation therapy 02/22/13- 03/13/13    left proximal humerus 3500 cGy 14 sessions  . Lymphedema     right arm  . Diabetes mellitus     metformin  . Cancer [facility])     right breast  . Metastasis from malignant tumor of breast ([place])     left humerous  . Metastasis from malignant tumor of breast [facility])     liver    Past Surgical History  Procedure Laterality Date  . Cesarean section    . Thyroidectomy  2002  . Foot surgery      as a child for club foot  . Breast surgery  2012    rt lump-16 nodes-in [place]  . Cesarean section    . Colonoscopy    . Dilation and curettage of uterus    . Humerus im nail Left 12/27/2012    Procedure: INTRAMEDULLARY (IM) NAIL HUMERAL LEFT PATHOLOGIC FRACTURE;  Surgeon: Nita Sells, MD;  Location: [facility];  Service: Orthopedics;  Laterality: Left;    There were no vitals filed for this visit.  Visit Diagnosis:  Rotator cuff tear arthropathy, left  Pain in joint of left shoulder  Weakness of left arm  Impaired mobility and ADLs  Patient had 2 or more falls in past year      Subjective Assessment - 09/24/15  1019    Subjective I carried something a little heavy; a bag of books to church. It hurt later. MD appointment next month for follow up.    Currently in Pain? No/denies  not really   Aggravating Factors  cloudy weather                         [facility] Adult PT Treatment/Exercise - 09/24/15 0001    Shoulder Exercises: Supine   Flexion AROM;12 reps   Shoulder Exercises: Pulleys   Flexion 2 minutes   Shoulder Exercises: ROM/Strengthening   Other ROM/Strengthening Exercises UE ranger on floor progressed to wall at level 10 with assist required to perform motion. Horizontal ab.addct also with assist from PTA- increased pain after horiz ab.addct.    Moist Heat Therapy   Number Minutes Moist Heat 15 Minutes   Moist Heat Location Shoulder                  PT Short Term Goals - 09/17/15 1028    PT SHORT TERM GOAL #1   Title Pt will be I with initial HEP for continued strengthening and  mobility by 09/26/15   Time 4   Period Weeks   Status Achieved   PT SHORT TERM GOAL #2   Title R shoulder PROM will improve by 10 degrees pain-free for improved overhead reaching activities by 09/26/15.   Time 4   Period Weeks   Status Achieved           PT Long Term Goals - 09/17/15 1029    PT LONG TERM GOAL #1   Title Pt will be indep with advanced HEP by 10/20/15.    Time 8   Period Weeks   Status On-going   PT LONG TERM GOAL #2   Title FOTO score will improve from 59% limited to 39% to demo improved function and mobility by 10/20/15.    Time 8   Period Weeks   Status On-going   PT LONG TERM GOAL #3   Title Pt will be able to lift 1# overhead x 10 reps without pain in order to return to pain-free functional activities for lifting overhead by 10/20/15.   Time 8   Period Weeks   Status On-going               Plan - 09/24/15 1051    Clinical Impression Statement UE ranger used to perform AAROM in standing progressing flexion from 60  degrees to 120 degrees as  well as horizontal abduction/adduction. Pt reports increased pain following horizontal abduction up to 8/10 pain. [place] post treatment for pain management.    PT Next Visit Plan Continue AAROM in standing and AROM ther ex, pain - free.         Problem List Patient Active Problem List   Diagnosis Date Noted  . Eczema, dyshidrotic 06/19/2015  . Localized edema 06/19/2015  . 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 Allergy history, radiographic dye 11/21/2014  . Metastases to the liver ([place]) 07/17/2014  . Routine general medical examination at a health care facility 07/10/2014  . Situational anxiety 04/18/2014  . Severe obesity (BMI >= 40) ([place]) 01/15/2014  . Coronary artery calcification seen on CAT scan 12/25/2013  . Atherosclerosis of aorta ([place]) 12/02/2013  . Unspecified constipation 08/21/2013  . Breast cancer of lower-outer quadrant of left female breast ([place]) 05/09/2013  . Venous stasis dermatitis 03/22/2013  . Bone metastasis ([place]) 01/31/2013  . Unspecified vitamin D deficiency 12/23/2012  . Low back pain 12/01/2012  . Lymphedema of upper extremity following lymphadenectomy 02/15/2012  . Spinal stenosis in cervical region 10/28/2011  . Cervical spondylitis with radiculitis ([place]) 10/28/2011  . Hyperlipidemia with target LDL less than 100 09/03/2011  . Type 2 diabetes, uncontrolled, with neuropathy ([place]) 09/03/2011  . Osteoporosis 09/03/2011  . Hypothyroidism 09/03/2011  . Generalized OA 09/03/2011    Dorene Ar, PTA 09/24/2015, 10:57 AM  [facility] [address] [place], [place], [address] Phone: [phone]   Fax:  [phone]  Name: Christy Harrell MRN: [id number] Date of Birth: 03-29-1941

## 2015-09-26 ENCOUNTER — Ambulatory Visit: Payer: Medicare Other | Admitting: Physical Therapy

## 2015-09-26 DIAGNOSIS — M75102 Unspecified rotator cuff tear or rupture of left shoulder, not specified as traumatic: Principal | ICD-10-CM

## 2015-09-26 DIAGNOSIS — M12812 Other specific arthropathies, not elsewhere classified, left shoulder: Secondary | ICD-10-CM

## 2015-09-26 DIAGNOSIS — R29898 Other symptoms and signs involving the musculoskeletal system: Secondary | ICD-10-CM

## 2015-09-26 DIAGNOSIS — M25512 Pain in left shoulder: Secondary | ICD-10-CM

## 2015-09-26 DIAGNOSIS — M12512 Traumatic arthropathy, left shoulder: Secondary | ICD-10-CM | POA: Diagnosis not present

## 2015-09-26 DIAGNOSIS — Z7409 Other reduced mobility: Secondary | ICD-10-CM

## 2015-09-26 NOTE — Therapy (Signed)
[place], [place], [address] Phone: [phone]   Fax:  [phone]  Physical Therapy Treatment  Patient Details  Name: Christy Harrell MRN: [id number] Date of Birth: May 13, 1941 Referring Provider: Tania Ade  Encounter Date: 09/26/2015      PT End of Session - 09/26/15 1014    Visit Number 7   Number of Visits 20   Date for PT Re-Evaluation 10/20/15   PT Start Time 1013   PT Stop Time 1110   PT Time Calculation (min) 57 min   Activity Tolerance Patient tolerated treatment well   Behavior During Therapy [facility] for tasks assessed/performed      Past Medical History  Diagnosis Date  . Club foot   . Arthritis   . Thyroid disease   . Hyperlipidemia   . Osteoporosis   . Hypertension   . GERD (gastroesophageal reflux disease)     watches diet  . Neuromuscular disorder (HCC)     lt arm numb sometimes  . Wears glasses   . History of radiation therapy 02/22/13- 03/13/13    left proximal humerus 3500 cGy 14 sessions  . Lymphedema     right arm  . Diabetes mellitus     metformin  . Cancer [facility])     right breast  . 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 Metastasis from malignant tumor of breast [facility])     liver    Past Surgical History  Procedure Laterality Date  . Cesarean section    . Thyroidectomy  2002  . Foot surgery      as a child for club foot  . Breast surgery  2012    rt lump-16 nodes-in [place]  . Cesarean section    . Colonoscopy    . Dilation and curettage of uterus    . Humerus im nail Left 12/27/2012    Procedure: INTRAMEDULLARY (IM) NAIL HUMERAL LEFT PATHOLOGIC FRACTURE;  Surgeon: Nita Sells, MD;  Location: [place];  Service: Orthopedics;  Laterality: Left;    There were no vitals filed for this visit.  Visit Diagnosis:  Rotator cuff tear arthropathy, left  Pain in joint of left shoulder  Weakness of left  arm  Impaired mobility and ADLs      Subjective Assessment - 09/26/15 1014    Subjective Can't tell if therapy is helping yet.  I try to use my cane for the exercises but its hard.    Currently in Pain? No/denies   Pain Score 0-No pain  at rest, premedicated   Pain Location Shoulder   Pain Orientation Left                         [facility] Adult PT Treatment/Exercise - 09/26/15 1039    Shoulder Exercises: Supine   External Rotation AAROM;Left;10 reps   Flexion AROM;Strengthening;Both;20 reps   Other Supine Exercises chest press with cane and ER with cane x 10 each    Shoulder Exercises: Seated   Elevation AROM;Strengthening;Both;10 reps   Elevation Weight (lbs) cane    Retraction Strengthening;Both;10 reps   Other Seated Exercises bicep curls 3 lbs cuff weight on cane    Shoulder Exercises: ROM/Strengthening   Other ROM/Strengthening Exercises UE ranger level 16 height, flexion and horizontal add/abd with circles both directions, spent several minutes here, no pain increase.  Added a 8 inch step to increase ROM.  Cues for scapualr position.    Shoulder Exercises: Isometric Strengthening   External Rotation Supine;5X10"   Internal Rotation Supine;5X10"   Moist Heat Therapy   Number Minutes Moist Heat 15 Minutes   Moist Heat Location Shoulder   Manual Therapy   Manual Therapy Joint mobilization;Soft tissue mobilization;Passive ROM   Joint Mobilization gentle joint distraction , Inf- post glide grade 1-3    Passive ROM PROM L shoulder all directions   isometric for IR and ER very weak ER (1/5)                 PT Education - 09/26/15 1300    Education provided Yes   Education Details how to use cane like a UE ranger   Person(s) Educated Patient   Methods Explanation;Demonstration   Comprehension Verbalized understanding;Returned demonstration          PT Short Term Goals - 09/17/15 1028    PT SHORT TERM GOAL #1   Title Pt will be I with initial  HEP for continued strengthening and mobility by 09/26/15   Time 4   Period Weeks   Status Achieved   PT SHORT TERM GOAL #2   Title R shoulder PROM will improve by 10 degrees pain-free for improved overhead reaching activities by 09/26/15.   Time 4   Period Weeks   Status Achieved           PT Long Term Goals - 09/17/15 1029    PT LONG TERM GOAL #1   Title Pt will be indep with advanced HEP by 10/20/15.    Time 8   Period Weeks   Status On-going   PT LONG TERM GOAL #2   Title FOTO score will improve from 59% limited to 39% to demo improved function and mobility by 10/20/15.    Time 8   Period Weeks   Status On-going   PT LONG TERM GOAL #3   Title Pt will be able to lift 1# overhead x 10 reps without pain in order to return to pain-free functional activities for lifting overhead by 10/20/15.   Time 8   Period Weeks   Status On-going               Plan - 09/26/15 1301    Clinical Impression Statement Pt without pain today for most AAROM ther ex.  She has significant weakness in external rotation, increased time spent today with instructing on HEP.  No further goals met.    PT Next Visit Plan Continue AAROM in standing and AROM ther ex, pain - free. isometrics    PT Home Exercise Plan table slides/ AAROM flexion and ER.    Consulted and Agree with Plan of Care Patient        Problem List Patient Active Problem List   Diagnosis Date Noted  . Eczema, dyshidrotic 06/19/2015  . Localized edema 06/19/2015  . 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 Allergy history, radiographic dye 11/21/2014  . Metastases to the liver ([place]) 07/17/2014  . Routine general medical examination at a health care facility 07/10/2014  . Situational anxiety 04/18/2014  . Severe obesity (BMI >= 40) ([place]) 01/15/2014  . Coronary artery calcification seen on CAT scan 12/25/2013  . Atherosclerosis of aorta ([place]) 12/02/2013  . Unspecified constipation  08/21/2013  . Breast cancer of lower-outer quadrant of left female breast ([place]) 05/09/2013  . Venous stasis dermatitis 03/22/2013  . Bone metastasis ([place]) 01/31/2013  .  Unspecified vitamin D deficiency 12/23/2012  . Low back pain 12/01/2012  . Lymphedema of upper extremity following lymphadenectomy 02/15/2012  . Spinal stenosis in cervical region 10/28/2011  . Cervical spondylitis with radiculitis ([place]) 10/28/2011  . Hyperlipidemia with target LDL less than 100 09/03/2011  . Type 2 diabetes, uncontrolled, with neuropathy ([place]) 09/03/2011  . Osteoporosis 09/03/2011  . Hypothyroidism 09/03/2011  . Generalized OA 09/03/2011    Breyson Kelm 09/26/2015, 1:07 PM  [facility] [address] [place], [place], [address] Phone: [phone]   Fax:  [phone]  Name: Kashia Brossard MRN: [id number] Date of Birth: 04-13-1941    Raeford Razor, PT 09/26/2015 1:08 PM Phone: [phone] Fax: [phone]

## 2015-09-30 MED FILL — DOXEPIN 25 MG CAPSULE: 25 | 90 days supply | Qty: 180 | Fill #0

## 2015-10-01 ENCOUNTER — Ambulatory Visit: Payer: Medicare Other | Admitting: Physical Therapy

## 2015-10-01 DIAGNOSIS — M25512 Pain in left shoulder: Secondary | ICD-10-CM

## 2015-10-01 DIAGNOSIS — M12812 Other specific arthropathies, not elsewhere classified, left shoulder: Secondary | ICD-10-CM

## 2015-10-01 DIAGNOSIS — Z7409 Other reduced mobility: Secondary | ICD-10-CM

## 2015-10-01 DIAGNOSIS — M75102 Unspecified rotator cuff tear or rupture of left shoulder, not specified as traumatic: Principal | ICD-10-CM

## 2015-10-01 DIAGNOSIS — M12512 Traumatic arthropathy, left shoulder: Secondary | ICD-10-CM | POA: Diagnosis not present

## 2015-10-01 DIAGNOSIS — R29898 Other symptoms and signs involving the musculoskeletal system: Secondary | ICD-10-CM

## 2015-10-01 NOTE — Therapy (Signed)
[place], [place], [address] Phone: [phone]   Fax:  [phone]  Physical Therapy Treatment  Patient Details  Name: Christy Harrell MRN: [id number] Date of Birth: 1940-10-10 Referring Provider: Tania Ade  Encounter Date: 10/01/2015      PT End of Session - 10/01/15 1235    Visit Number 8   Number of Visits 20   Date for PT Re-Evaluation 10/20/15   PT Start Time 1020  came at wrong time   PT Stop Time 1110   PT Time Calculation (min) 50 min   Activity Tolerance Patient tolerated treatment well   Behavior During Therapy [facility] for tasks assessed/performed      Past Medical History  Diagnosis Date  . Club foot   . Arthritis   . Thyroid disease   . Hyperlipidemia   . Osteoporosis   . Hypertension   . GERD (gastroesophageal reflux disease)     watches diet  . Neuromuscular disorder (HCC)     lt arm numb sometimes  . Wears glasses   . History of radiation therapy 02/22/13- 03/13/13    left proximal humerus 3500 cGy 14 sessions  . Lymphedema     right arm  . Diabetes mellitus     metformin  . Cancer [facility])     right breast  . 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 Metastasis from malignant tumor of breast [facility])     liver    Past Surgical History  Procedure Laterality Date  . Cesarean section    . Thyroidectomy  2002  . Foot surgery      as a child for club foot  . Breast surgery  2012    rt lump-16 nodes-in [place]  . Cesarean section    . Colonoscopy    . Dilation and curettage of uterus    . Humerus im nail Left 12/27/2012    Procedure: INTRAMEDULLARY (IM) NAIL HUMERAL LEFT PATHOLOGIC FRACTURE;  Surgeon: Nita Sells, MD;  Location: [place];  Service: Orthopedics;  Laterality: Left;    There were no vitals filed for this visit.  Visit Diagnosis:  Rotator cuff tear arthropathy, left  Pain in joint of left  shoulder  Weakness of left arm  Impaired mobility and ADLs      Subjective Assessment - 10/01/15 1029    Subjective Sees MD tomorrow.  Went dancing the other night.     Currently in Pain? No/denies            [facility] PT Assessment - 10/01/15 1055    AROM   Left Shoulder Flexion 120 Degrees  supine   Left Shoulder ABduction 125 Degrees  supine   Left Shoulder Internal Rotation 80 Degrees  at 90 deg abd    Left Shoulder External Rotation 18 Degrees  35 with PROM    Strength   Left Shoulder Internal Rotation 3+/5   Left Shoulder External Rotation 2/5             [facility] Adult PT Treatment/Exercise - 10/01/15 1036    Shoulder Exercises: Supine   Protraction Strengthening;Left;10 reps   Flexion Strengthening;Left;10 reps   Shoulder Exercises: Seated   Other Seated Exercises UE ranger: resisted row with yellow band x 10 slow and x10 horiz abd x 10 with PT assist.    Shoulder Exercises: ROM/Strengthening   Other ROM/Strengthening Exercises UE ranger standing and seated for NM control  of scapula , UE flexion, circles, horiz abd and adduction . Manual cues.      Cues to stay focused.       PT Education - 10/01/15 1234    Education provided Yes   Education Details scapular position   Person(s) Educated Patient   Methods Explanation;Demonstration;Tactile cues   Comprehension Verbalized understanding;Returned demonstration          PT Short Term Goals - 10/01/15 1237    PT SHORT TERM GOAL #1   Title Pt will be I with initial HEP for continued strengthening and mobility by 09/26/15   Status Achieved   PT SHORT TERM GOAL #2   Title R shoulder PROM will improve by 10 degrees pain-free for improved overhead reaching activities by 09/26/15.   Status Achieved           PT Long Term Goals - 10/01/15 1236    PT LONG TERM GOAL #1   Title Pt will be indep with advanced HEP by 10/20/15.    Status On-going   PT LONG TERM GOAL #2   Title FOTO score will improve from  59% limited to 39% to demo improved function and mobility by 10/20/15.    Status On-going   PT LONG TERM GOAL #3   Title Pt will be able to lift 1# overhead x 10 reps without pain in order to return to pain-free functional activities for lifting overhead by 10/20/15.   Status On-going               Plan - 10/01/15 1236    Clinical Impression Statement Focused on pain free A/AROM with scapular neutral.  Able to do in sitting.  Pt observed donning coat, has to use momentum to fling coat overhead.  Still wants to be able to do her hair.  Slow to progress    PT Next Visit Plan Continue AAROM in standing and AROM ther ex, pain - free. isometrics    PT Home Exercise Plan table slides/ AAROM flexion and ER.    Consulted and Agree with Plan of Care Patient        Problem List Patient Active Problem List   Diagnosis Date Noted  . Eczema, dyshidrotic 06/19/2015  . Localized edema 06/19/2015  . 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 Allergy history, radiographic dye 11/21/2014  . Metastases to the liver ([place]) 07/17/2014  . Routine general medical examination at a health care facility 07/10/2014  . Situational anxiety 04/18/2014  . Severe obesity (BMI >= 40) ([place]) 01/15/2014  . Coronary artery calcification seen on CAT scan 12/25/2013  . Atherosclerosis of aorta ([place]) 12/02/2013  . Unspecified constipation 08/21/2013  . Breast cancer of lower-outer quadrant of left female breast ([place]) 05/09/2013  . Venous stasis dermatitis 03/22/2013  . Bone metastasis ([place]) 01/31/2013  . Unspecified vitamin D deficiency 12/23/2012  . Low back pain 12/01/2012  . Lymphedema of upper extremity following lymphadenectomy 02/15/2012  . Spinal stenosis in cervical region 10/28/2011  . Cervical spondylitis with radiculitis ([place]) 10/28/2011  . Hyperlipidemia with target LDL less than 100 09/03/2011  . Type 2 diabetes, uncontrolled, with neuropathy ([place]) 09/03/2011   . Osteoporosis 09/03/2011  . Hypothyroidism 09/03/2011  . Generalized OA 09/03/2011    Alessandro Griep 10/01/2015, 12:39 PM  [facility] [address] [place], [place], [address] Phone: [phone]   Fax:  [phone]  Name: Illyana Danial MRN: [id number] Date of Birth: 02/13/1941  [name]  Lezlie Lye, PT 10/01/2015 12:40 PM Phone: [phone] Fax: [phone]

## 2015-10-04 ENCOUNTER — Telehealth: Payer: Self-pay | Admitting: *Deleted

## 2015-10-04 ENCOUNTER — Ambulatory Visit
Admission: RE | Admit: 2015-10-04 | Discharge: 2015-10-04 | Disposition: A | Discharge status: Home / Self Care | Payer: Medicare Other | Source: Ambulatory Visit | Attending: Internal Medicine | Admitting: Internal Medicine

## 2015-10-04 DIAGNOSIS — C50512 Malignant neoplasm of lower-outer quadrant of left female breast: Secondary | ICD-10-CM

## 2015-10-04 DIAGNOSIS — G4489 Other headache syndrome: Secondary | ICD-10-CM

## 2015-10-04 MED ORDER — MAGIC MOUTHWASH: 5.0000 mL | Freq: Four times a day (QID) | ORAL | Status: DC | PRN

## 2015-10-04 NOTE — Telephone Encounter (Signed)
Message left by pt stating she is " having problems with my mouth"- requesting mouth wash per MD.  Pt also stated " need to see dentist but was told to call the doctor "  Per message pt states she would be unavailable until later today but to leave a message regarding the mouth wash.  This RN called in prescription for MMW to [facility].  Message left on pt's home number per above.

## 2015-10-07 ENCOUNTER — Encounter: Payer: Self-pay | Admitting: Cardiology

## 2015-10-07 NOTE — Telephone Encounter (Signed)
CT scheduled for 3/22.

## 2015-10-08 ENCOUNTER — Telehealth: Payer: Self-pay | Admitting: *Deleted

## 2015-10-08 DIAGNOSIS — M5412 Radiculopathy, cervical region: Secondary | ICD-10-CM

## 2015-10-08 DIAGNOSIS — G893 Neoplasm related pain (acute) (chronic): Secondary | ICD-10-CM

## 2015-10-08 DIAGNOSIS — M5030 Other cervical disc degeneration, unspecified cervical region: Secondary | ICD-10-CM

## 2015-10-08 DIAGNOSIS — R079 Chest pain, unspecified: Secondary | ICD-10-CM

## 2015-10-08 DIAGNOSIS — M4692 Unspecified inflammatory spondylopathy, cervical region: Secondary | ICD-10-CM

## 2015-10-08 MED ORDER — OXYCODONE HCL 5 MG PO TABS
5.0000 mg | ORAL_TABLET | ORAL | Status: DC | PRN
Start: 2015-10-08 — End: 2015-12-03

## 2015-10-08 NOTE — Telephone Encounter (Signed)
Received call pt is requesting refill on her Oxycodone...Christy Harrell

## 2015-10-08 NOTE — Telephone Encounter (Signed)
Called pt no answer LMOM rx ready for pick-up.../lmb 

## 2015-10-08 NOTE — Telephone Encounter (Signed)
done

## 2015-10-10 ENCOUNTER — Ambulatory Visit: Payer: Medicare Other | Attending: Orthopedic Surgery

## 2015-10-10 DIAGNOSIS — Z9181 History of falling: Secondary | ICD-10-CM | POA: Insufficient documentation

## 2015-10-10 DIAGNOSIS — Z7409 Other reduced mobility: Secondary | ICD-10-CM | POA: Insufficient documentation

## 2015-10-10 DIAGNOSIS — M12512 Traumatic arthropathy, left shoulder: Secondary | ICD-10-CM | POA: Diagnosis present

## 2015-10-10 DIAGNOSIS — M25512 Pain in left shoulder: Secondary | ICD-10-CM | POA: Diagnosis present

## 2015-10-10 DIAGNOSIS — R29898 Other symptoms and signs involving the musculoskeletal system: Secondary | ICD-10-CM | POA: Diagnosis present

## 2015-10-10 DIAGNOSIS — M12812 Other specific arthropathies, not elsewhere classified, left shoulder: Secondary | ICD-10-CM

## 2015-10-10 DIAGNOSIS — M75102 Unspecified rotator cuff tear or rupture of left shoulder, not specified as traumatic: Secondary | ICD-10-CM

## 2015-10-10 NOTE — Therapy (Addendum)
[place], [place], [address] Phone: [phone]   Fax:  [phone]  Physical Therapy RE- Eval and Treatment  Patient Details  Name: Christy Harrell MRN: [id number] Date of Birth: 26-Dec-1940 Referring Provider: Tania Ade  Encounter Date: 10/10/2015      PT End of Session - 10/10/15 1405    Visit Number 9  RE-EVAL   Number of Visits 20   Date for PT Re-Evaluation 11/20/15   PT Start Time 1330   PT Stop Time 1430   PT Time Calculation (min) 60 min   Activity Tolerance Patient tolerated treatment well   Behavior During Therapy [facility] for tasks assessed/performed      Past Medical History  Diagnosis Date  . Club foot   . Arthritis   . Thyroid disease   . Hyperlipidemia   . Osteoporosis   . Hypertension   . GERD (gastroesophageal reflux disease)     watches diet  . Neuromuscular disorder (HCC)     lt arm numb sometimes  . Wears glasses   . History of radiation therapy 02/22/13- 03/13/13    left proximal humerus 3500 cGy 14 sessions  . Lymphedema     right arm  . Diabetes mellitus     metformin  . Cancer [facility])     right breast  . 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 Metastasis from malignant tumor of breast [facility])     liver    Past Surgical History  Procedure Laterality Date  . Cesarean section    . Thyroidectomy  2002  . Foot surgery      as a child for club foot  . Breast surgery  2012    rt lump-16 nodes-in [place]  . Cesarean section    . Colonoscopy    . Dilation and curettage of uterus    . Humerus im nail Left 12/27/2012    Procedure: INTRAMEDULLARY (IM) NAIL HUMERAL LEFT PATHOLOGIC FRACTURE;  Surgeon: Nita Sells, MD;  Location: [place];  Service: Orthopedics;  Laterality: Left;    There were no vitals filed for this visit.  Visit Diagnosis:  Rotator cuff tear arthropathy, left - Plan: PT plan of care  cert/re-cert  Pain in joint of left shoulder - Plan: PT plan of care cert/re-cert  Weakness of left arm - Plan: PT plan of care cert/re-cert  Impaired mobility and ADLs - Plan: PT plan of care cert/re-cert  Patient had 2 or more falls in past year - Plan: PT plan of care cert/re-cert          [facility] PT Assessment - 10/10/15 0001    AROM   Left Shoulder Flexion 50 Degrees   Left Shoulder ABduction 60 Degrees   Left Shoulder Internal Rotation --  FIR: L5   Left Shoulder External Rotation --  FER: C4   PROM   Left Shoulder Flexion 130 Degrees   Left Shoulder ABduction 115 Degrees   Left Shoulder Internal Rotation 60 Degrees  @90  degrees ABD   Left Shoulder External Rotation 45 Degrees  @ 90 degrees ABD   Strength   Left Shoulder Flexion 2-/5   Left Shoulder ABduction 2-/5   Left Shoulder Internal Rotation 3+/5   Left Shoulder External Rotation 2-/5                     [facility] Adult PT Treatment/Exercise - 10/10/15 0001  Shoulder Exercises: Standing   Other Standing Exercises Cane standing: flex, ABD, ER, IR 10 x each   HEP   Other Standing Exercises Physioball flexion     Shoulder Exercises: Pulleys   Flexion 2 minutes   ABduction 2 minutes   Moist Heat Therapy   Number Minutes Moist Heat 15 Minutes   Moist Heat Location Shoulder   Manual Therapy   Passive ROM PROM L shoulder all directions                 PT Education - 10/10/15 1404    Education provided Yes   Education Details PT POC to continue 2 x a week x 6 weeks.  Progress with ROM and goals, new goals, and prescribed HEP with cane ther ex for flex, ABD, ER and IR,    Person(s) Educated Patient   Methods Explanation;Demonstration;Handout   Comprehension Verbalized understanding;Returned demonstration;Tactile cues required          PT Short Term Goals - 10/10/15 1351    PT SHORT TERM GOAL #1   Title Pt will be I with initial HEP for continued strengthening and mobility by  09/26/15   Time 4   Period Weeks   Status Achieved   PT SHORT TERM GOAL #2   Title R shoulder PROM will improve by 10 degrees pain-free for improved overhead reaching activities by 09/26/15.   Time 4   Period Weeks   Status Achieved           PT Long Term Goals - 10/10/15 1351    PT LONG TERM GOAL #1   Title Pt will be indep with advanced HEP by 11/20/15.    Time 6   Status Revised   PT LONG TERM GOAL #2   Title FOTO score will improve from 59% limited to 39% to demo improved function and mobility by 11/20/15.    Baseline 10/10/15: Improved from 59% limited at intake to 54% limited today.    Time 6   Period Weeks   Status Revised   PT LONG TERM GOAL #3   Title Updated 10/10/15: L shoulder strength will improve to 3/5 in order for pt to be able to lift 1# overhead x 10 reps without pain in order to return to pain-free functional activities for lifting overhead by 11/20/15.   Time 6   Period Weeks   Status Revised   PT LONG TERM GOAL #4   Title New 10/10/15: L shoulder AROM flexion will improve to 80 degrees in order to reach overhead for grooming her hair by 11/20/15.    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 Pt saw MD for follow-up and MD prescribed 6 more weeks of PT to see if surgery can be avoided. Pt has made some progress with PROM and AROM measure since eval, but strength conitnues to be limited. FOTO score improved from 59% limited to 54% limited.  Allthough pt is not sure if she can tell a difference due to being sick over the last few weeks, objective measures indicate slight improvement. Therefore, pt would benefit from continued outpt PT for 2 x a week x 6 weeks to progress toward goals and return to max level of function.  Pt  would benefit from progressing AAROM ther ex to more difficulty and eventually to  AROM ther ex, pain-free. STGs achieved and LTGs continue to be appropriate with new shoulder AROM flexion goal added.     Pt will benefit from skilled therapeutic intervention in order to improve on the following deficits Decreased strength;Decreased mobility;Decreased range of motion;Impaired flexibility;Increased muscle spasms;Postural dysfunction;Obesity;Pain;Impaired UE functional use;Increased edema   Rehab Potential Fair   PT Frequency 2x / week   PT Duration 6 weeks   PT Treatment/Interventions ADLs/Self Care Home Management;Functional mobility training;Therapeutic activities;Therapeutic exercise;Neuromuscular re-education;Patient/family education;Manual techniques;Passive range of motion   PT Next Visit Plan Continue AAROM in standing and AROM ther ex, pain - free. isometrics    PT Home Exercise Plan table slides/ AAROM flexion and ER.    Consulted and Agree with Plan of Care Patient          G-Codes - 2015-11-02 1410    Functional Assessment Tool Used FOTO and clinical judgement   Functional Limitation Carrying, moving and handling objects   Carrying, Moving and Handling Objects Current Status [phone]) At least 40 percent but less than 60 percent impaired, limited or restricted   Carrying, Moving and Handling Objects Goal Status [id number]) At least 20 percent but less than 40 percent impaired, limited or restricted      Problem List Patient Active Problem List   Diagnosis Date Noted  . Eczema, dyshidrotic 06/19/2015  . Localized edema 06/19/2015  . 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 Allergy history, radiographic dye 11/21/2014  . Metastases to the liver ([place]) 07/17/2014  . Routine general medical examination at a health care facility 07/10/2014  . Situational anxiety 04/18/2014  . Severe obesity (BMI >= 40) ([place]) 01/15/2014  . Coronary artery calcification seen on CAT scan 12/25/2013  . Atherosclerosis of aorta ([place]) 12/02/2013  .  Unspecified constipation 08/21/2013  . Breast cancer of lower-outer quadrant of left female breast ([place]) 05/09/2013  . Venous stasis dermatitis 03/22/2013  . Bone metastasis ([place]) 01/31/2013  . Unspecified vitamin D deficiency 12/23/2012  . Low back pain 12/01/2012  . Lymphedema of upper extremity following lymphadenectomy 02/15/2012  . Spinal stenosis in cervical region 10/28/2011  . Cervical spondylitis with radiculitis ([place]) 10/28/2011  . Hyperlipidemia with target LDL less than 100 09/03/2011  . Type 2 diabetes, uncontrolled, with neuropathy ([place]) 09/03/2011  . Osteoporosis 09/03/2011  . Hypothyroidism 09/03/2011  . Generalized OA 09/03/2011    Dollene Cleveland, PT 11/02/15, 4:31 PM  [facility] [address] [place], [place], [address] Phone: [phone]   Fax:  [phone]  Name: Shirleyann Ilano MRN: [id number] Date of Birth: [birth year]

## 2015-10-10 NOTE — Patient Instructions (Signed)
Shoulder standing Cane handout from filing cabinet : Flex, ABD, ER standing

## 2015-10-14 ENCOUNTER — Ambulatory Visit (HOSPITAL_COMMUNITY): Admission: RE | Admit: 2015-10-14 | Payer: Medicare Other | Source: Ambulatory Visit

## 2015-10-14 ENCOUNTER — Ambulatory Visit: Payer: Medicare Other | Admitting: Physical Therapy

## 2015-10-17 ENCOUNTER — Other Ambulatory Visit: Payer: Medicare Other

## 2015-10-17 ENCOUNTER — Other Ambulatory Visit (HOSPITAL_BASED_OUTPATIENT_CLINIC_OR_DEPARTMENT_OTHER): Payer: Medicare Other

## 2015-10-17 ENCOUNTER — Ambulatory Visit: Payer: Medicare Other

## 2015-10-17 ENCOUNTER — Ambulatory Visit (HOSPITAL_BASED_OUTPATIENT_CLINIC_OR_DEPARTMENT_OTHER): Payer: Medicare Other | Admitting: Nurse Practitioner

## 2015-10-17 ENCOUNTER — Telehealth: Payer: Self-pay | Admitting: Oncology

## 2015-10-17 ENCOUNTER — Encounter: Payer: Self-pay | Admitting: Nurse Practitioner

## 2015-10-17 VITALS — BP 134/50 | HR 73 | Temp 98.3°F | Resp 18 | Ht 60.0 in | Wt 175.7 lb

## 2015-10-17 DIAGNOSIS — C787 Secondary malignant neoplasm of liver and intrahepatic bile duct: Secondary | ICD-10-CM

## 2015-10-17 DIAGNOSIS — L0100 Impetigo, unspecified: Secondary | ICD-10-CM

## 2015-10-17 DIAGNOSIS — C50512 Malignant neoplasm of lower-outer quadrant of left female breast: Secondary | ICD-10-CM

## 2015-10-17 DIAGNOSIS — C7951 Secondary malignant neoplasm of bone: Secondary | ICD-10-CM

## 2015-10-17 DIAGNOSIS — Z5111 Encounter for antineoplastic chemotherapy: Secondary | ICD-10-CM

## 2015-10-17 DIAGNOSIS — M810 Age-related osteoporosis without current pathological fracture: Secondary | ICD-10-CM

## 2015-10-17 LAB — CBC WITH DIFFERENTIAL/PLATELET
BASO%: 0.6 % (ref 0.0–2.0)
Basophils Absolute: 0 10*3/uL (ref 0.0–0.1)
EOS ABS: 0.1 10*3/uL (ref 0.0–0.5)
EOS%: 1.8 % (ref 0.0–7.0)
HCT: 31.5 % — ABNORMAL LOW (ref 34.8–46.6)
HEMOGLOBIN: 10.2 g/dL — AB (ref 11.6–15.9)
LYMPH#: 1.3 10*3/uL (ref 0.9–3.3)
LYMPH%: 22.2 % (ref 14.0–49.7)
MCH: 25.3 pg (ref 25.1–34.0)
MCHC: 32.4 g/dL (ref 31.5–36.0)
MCV: 77.9 fL — AB (ref 79.5–101.0)
MONO#: 0.8 10*3/uL (ref 0.1–0.9)
MONO%: 13.8 % (ref 0.0–14.0)
NEUT%: 61.6 % (ref 38.4–76.8)
NEUTROS ABS: 3.6 10*3/uL (ref 1.5–6.5)
PLATELETS: 248 10*3/uL (ref 145–400)
RBC: 4.04 10*6/uL (ref 3.70–5.45)
RDW: 14.7 % — AB (ref 11.2–14.5)
WBC: 5.9 10*3/uL (ref 3.9–10.3)

## 2015-10-17 LAB — COMPREHENSIVE METABOLIC PANEL
ALK PHOS: 77 U/L (ref 40–150)
ALT: 17 U/L (ref 0–55)
ANION GAP: 9 meq/L (ref 3–11)
AST: 28 U/L (ref 5–34)
Albumin: 2.7 g/dL — ABNORMAL LOW (ref 3.5–5.0)
BILIRUBIN TOTAL: 0.3 mg/dL (ref 0.20–1.20)
BUN: 12 mg/dL (ref 7.0–26.0)
CALCIUM: 9.3 mg/dL (ref 8.4–10.4)
CO2: 27 meq/L (ref 22–29)
CREATININE: 0.9 mg/dL (ref 0.6–1.1)
Chloride: 104 mEq/L (ref 98–109)
EGFR: 74 mL/min/{1.73_m2} — AB (ref >90)
Glucose: 136 mg/dl (ref 70–140)
Potassium: 4.1 mEq/L (ref 3.5–5.1)
Sodium: 141 mEq/L (ref 136–145)
TOTAL PROTEIN: 7 g/dL (ref 6.4–8.3)

## 2015-10-17 LAB — FERRITIN: FERRITIN: 119 ng/mL (ref 9–269)

## 2015-10-17 MED ORDER — FULVESTRANT 250 MG/5ML IM SOLN
500.0000 mg | Freq: Once | INTRAMUSCULAR | Status: AC
  Administered 2015-10-17: 500 mg via INTRAMUSCULAR
  Filled 2015-10-17: qty 10

## 2015-10-17 MED ORDER — CEPHALEXIN 500 MG PO CAPS: 500.0000 mg | ORAL_CAPSULE | Freq: Two times a day (BID) | ORAL | Status: DC

## 2015-10-17 NOTE — Progress Notes (Signed)
ID: Christy Harrell   DOB: 1941/06/01  MR#: [id number]  XBM#:[id number]  PCP: [name], MD GYN:  SU:  OTHER MD:  Alysia Penna, Frederik Pear, Faustino Congress  CC: Breast cancer stage IV  TREATMENT: exemestane, everolimus, fulvestrant, and denosumab  BREAST CANCER HISTORY: From the original intake note:  The patient had routine screening mammography 10/16/2010 showing a lobulated mass in the right subareolar region measuring up to 5.5 cm. The Left breast showed some central microcalcifications. Biopsy of the Right breast mass on 10/28/2010 showed an invasive ductal carcinoma, grade 2, estrogen receptor positive (Allred score 8), progesterone receptor positive (Allred score 5), with an equivocal HER-2. The Left breast area of microcalcifications was biopsied at the same time, and was read as suspicious for DCIS.  On 11/17/2010 the patient underwent Right lumpectomy and axillary lymph node dissection for what proved to be a 3 cm invasive ductal carcinoma, grade 2, with some papillary and mucinous features. One of 16 lymph nodes was involved. FISH showed no HER-2 amplification. Left breast biopsy was benign.  The patient had an Oncotype sent, with a score of 27, predicting a risk of distant recurrence after 5 years of tamoxifen in the 18% range. With this intermediate result, the decision was made not to proceed with chemotherapy, since the patient is the sole caregiver to her very elderly mother. Instead the patient proceeded to radiation treatment which was completed 03/06/2011. She started anastrozole at that point. Her subsequent history is as detailed below   INTERVAL HISTORY: Christy Harrell returns today for follow up of her metastatic breast cancer. She continues on exemestane and everolimus and tolerates both drugs well with few complaints. She has some hot flashes, but denies vaginal dryness, unusual arthralgias/myalgias. She has no mouth sores, rashes,  diarrhea, or pneumonitis. She is also due for her next dose of fulvestrant today. The timing of denosumab has been moved out to every 12 weeks.  REVIEW OF SYSTEMS: Christy Harrell has recent stopped using her fentanyl patch, despite it controlling her pain well. She developed a rash to her arms and legs and thought it was from this. Dr. Jenny Reichmann prescribed her lidex cream and told her to continue the OTC hydrocortisone cream she had been using. This rash has faded, and now she just has dry skin. She is using dove and aveeno products to combat this. She has a new rash to her right cheek and chin that is different. It does not hurt but its fairly pruritic as well. She is still not using the fentanyl patches as a result. She has increased her use of oxycodone. On top of her usual pain, her right mouth is sore where there are no longer teeth. She plans to follow up with a dentist. Otherwise Christy Harrell is stable. Her nausea is better managed since Dr. [name] prescribed her reglan. Amitiza has her moving her bowels well. She denies shortness of breath, chest pain, cough, or palpitations. She has occasional mild headaches. A detailed review of systems is otherwise stable.  PAST MEDICAL HISTORY: Past Medical History  Diagnosis Date  . Club foot   . Arthritis   . Thyroid disease   . Hyperlipidemia   . Osteoporosis   . Hypertension   . GERD (gastroesophageal reflux disease)     watches diet  . Neuromuscular disorder (HCC)     lt arm numb sometimes  . Wears glasses   . History of radiation therapy 02/22/13- 03/13/13    left proximal humerus 3500  cGy 14 sessions  . Lymphedema     right arm  . Diabetes mellitus     metformin  . Cancer [facility])     right breast  . Metastasis from malignant tumor of breast ([place])     left humerous  . Metastasis from malignant tumor of breast [facility])     liver    PAST SURGICAL HISTORY: Past Surgical History  Procedure Laterality Date  . Cesarean section    . Thyroidectomy   2002  . Foot surgery      as a child for club foot  . Breast surgery  2012    rt lump-16 nodes-in [place]  . Cesarean section    . Colonoscopy    . Dilation and curettage of uterus    . Humerus im nail Left 12/27/2012    Procedure: INTRAMEDULLARY (IM) NAIL HUMERAL LEFT PATHOLOGIC FRACTURE;  Surgeon: Nita Sells, MD;  Location: [place];  Service: Orthopedics;  Laterality: Left;    FAMILY HISTORY Family History  Problem Relation Age of Onset  . Cancer Neg Hx   . Arthritis Neg Hx   . Heart disease Neg Hx   . Hyperlipidemia Neg Hx   . Hypertension Neg Hx   . Dementia Mother   . Hearing loss Mother    The patient's father died from unknown causes. The patient's mother is alive at age 32. The patient was a single child. There is no history of breast or ovarian cancer in the family to her knowledge.  GYNECOLOGIC HISTORY: Menarche age 86, menopause in her early 46s. The patient is GX P1, first pregnancy to term age 4. She did not take hormone replacement  SOCIAL HISTORY: The patient grew up in [place], attended [employer], then moved to [place] where she lived about 40 years. 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 Her mother, [name] away at 100, on 2014-09-07. The patient's daughter [name], 67, is disabled secondary to pulmonary hypertension. She can be reached at [phone]. The patient has 2 grandchildren, [name] who works at a Scientist, physiological business and is 75 years old, in general, 3, completing high school.   ADVANCED DIRECTIVES:not in place  HEALTH MAINTENANCE: Social History  Substance Use Topics  . Smoking status: Former Smoker    Quit date: 12/23/1974  . Smokeless tobacco: Never Used  . Alcohol Use: No     Colonoscopy:  PAP:    Bone density:  2012, on ibandronate chronically  Lipid panel: UTD  Allergies  Allergen Reactions  . Bydureon [Exenatide] Rash  . Gadolinium Derivatives  Other (See Comments)    Pt began having chest numbness/tingling , stated her heart felt funny, had to take her to ED for EKG per RN   CAP  . Enalapril Rash    Current Outpatient Prescriptions  Medication Sig Dispense Refill  . aspirin EC 81 MG tablet Take 1 tablet (81 mg total) by mouth daily.    Marland Kitchen atorvastatin (LIPITOR) 80 MG tablet Take 1 tablet (80 mg total) by mouth daily. 30 tablet 11  . doxepin (SINEQUAN) 25 MG capsule Take 2 capsules (50 mg total) by mouth at bedtime as needed. 180 capsule 1  . everolimus (AFINITOR) 10 MG tablet Take 1 tablet (10 mg total) by mouth daily. 28 tablet 2  . exemestane (AROMASIN) 25 MG tablet Take 1 tablet (25 mg total) by mouth daily. 30 tablet 2  . fentaNYL (DURAGESIC - DOSED MCG/HR)  25 MCG/HR patch Place 25 mcg onto the skin every 3 (three) days.    . furosemide (LASIX) 40 MG tablet Take 1 tablet (40 mg total) by mouth daily. 30 tablet 6  . ketoconazole (NIZORAL) 2 % cream Apply 1 application topically daily. 60 g 2  . levothyroxine (SYNTHROID) 175 MCG tablet Take 1 tablet (175 mcg total) by mouth daily before breakfast. 90 tablet 1  . lubiprostone (AMITIZA) 24 MCG capsule Take 1 capsule (24 mcg total) by mouth 2 (two) times daily with a meal. 180 capsule 1  . magic mouthwash SOLN Take 5 mLs by mouth 4 (four) times daily as needed for mouth pain. 240 mL 3  . metFORMIN (GLUCOPHAGE) 500 MG tablet Take 1 tablet (500 mg total) by mouth daily. 90 tablet 1  . metoCLOPramide (REGLAN) 5 MG tablet Take 1 tablet (5 mg total) by mouth every 8 (eight) hours as needed for nausea. 90 tablet 12  . oxyCODONE (OXY IR/ROXICODONE) 5 MG immediate release tablet Take 1 tablet (5 mg total) by mouth every 4 (four) hours as needed for severe pain. Take 1-2 tablets every 4-6 hours as needed for pain - fill on or after 10/08/15 100 tablet 0  . Polyethyl Glycol-Propyl Glycol (SYSTANE ULTRA OP) Place 1 drop into both eyes daily.    . potassium chloride SA (K-DUR,KLOR-CON) 20 MEQ  tablet Take 1 tablet (20 mEq total) by mouth 2 (two) times daily. 180 tablet 1  . pregabalin (LYRICA) 75 MG capsule Take 1 capsule (75 mg total) by mouth 2 (two) times daily. Not taking 60 capsule 5  . cephALEXin (KEFLEX) 500 MG capsule Take 1 capsule (500 mg total) by mouth 2 (two) times daily. 14 capsule 0  . docusate sodium (COLACE) 100 MG capsule Take 100 mg by mouth 2 (two) times daily as needed for constipation. Reported on 10/17/2015    . fluocinonide-emollient (LIDEX-E) 0.05 % cream Apply 1 application topically 2 (two) times daily. (Patient not taking: Reported on 10/17/2015) 60 g 2  . LORazepam (ATIVAN) 0.5 MG tablet Take 15 minutes before MRI test; may repeat x 1 (Patient not taking: Reported on 10/17/2015) 10 tablet 0  . polyethylene glycol powder (GLYCOLAX/MIRALAX) powder Take 255 g (1 Container total) by mouth daily. (Patient not taking: Reported on 10/17/2015) 255 g 3   No current facility-administered medications for this visit.   PHYSICAL EXAM: Elderly African American woman who appears stated age 75 Vitals:   10/17/15 1142  BP: 134/50  Pulse: 73  Temp: 98.3 F (36.8 C)  Resp: 18   Body mass index is 34.31 kg/(m^2).    ECOG FS: 2 Filed Weights   10/17/15 1142  Weight: 175 lb 11.2 oz (79.697 kg)   Skin: rash consisting of erythematous pustules to right cheek and along chin under bottom lip HEENT: sclerae anicteric, conjunctivae pink, oropharynx clear. No thrush or mucositis.  Lymph Nodes: No cervical or supraclavicular lymphadenopathy  Lungs: clear to auscultation bilaterally, no rales, wheezes, or rhonci  Heart: regular rate and rhythm  Abdomen: round, soft, non tender, positive bowel sounds  Musculoskeletal: No focal spinal tenderness, grade 3 right upper extremity lymphedema Neuro: non focal, well oriented, positive affect  Breasts: deferred  LAB RESULTS: Lab Results  Component Value Date   WBC 5.9 10/17/2015   NEUTROABS 3.6 10/17/2015   HGB 10.2* 10/17/2015   HCT  31.5* 10/17/2015   MCV 77.9* 10/17/2015   PLT 248 10/17/2015      Chemistry  Component Value Date/Time   NA 141 10/17/2015 1128   NA 142 06/19/2015 1156   K 4.1 10/17/2015 1128   K 3.8 06/19/2015 1156   CL 106 06/19/2015 1156   CL 105 01/23/2013 1337   CO2 27 10/17/2015 1128   CO2 26 06/19/2015 1156   BUN 12.0 10/17/2015 1128   BUN 11 06/19/2015 1156   CREATININE 0.9 10/17/2015 1128   CREATININE 0.81 06/19/2015 1156      Component Value Date/Time   CALCIUM 9.3 10/17/2015 1128   CALCIUM 9.1 06/19/2015 1156   ALKPHOS 77 10/17/2015 1128   ALKPHOS 80 09/09/2015 1140   AST 28 10/17/2015 1128   AST 19 09/09/2015 1140   ALT 17 10/17/2015 1128   ALT 10 09/09/2015 1140   BILITOT 0.30 10/17/2015 1128   BILITOT 0.5 09/09/2015 1140       Lab Results  Component Value Date   LABCA2 24 11/19/2011    STUDIES: Mr Brain Wo Contrast  10/04/2015  CLINICAL DATA:  New headaches and nosebleeds for 3-4 weeks. History of left lower outer quadrant breast cancer. EXAM: MRI HEAD WITHOUT CONTRAST TECHNIQUE: Multiplanar, multiecho pulse sequences of the brain and surrounding structures were obtained without intravenous contrast. COMPARISON:  None. FINDINGS: The study was performed without IV contrast at the request of the ordering physician due to patient's history of prior adverse reaction to gadolinium. Sensitivity for detection of intracranial metastases is limited by this. There is a punctate, 1.5 mm focus of increase trace diffusion signal in the right corona radiata adjacent to the body of the caudate with suggestion of corresponding reduced ADC (series 6, image 69). There is no evidence of acute infarct elsewhere. There is no evidence of intracranial hemorrhage, mass, midline shift, or extra-axial fluid collection. Ventricles and sulci are normal for age. Small foci of T2 hyperintensity in the cerebral white matter bilaterally are nonspecific but compatible with mild chronic small vessel  ischemic disease. Orbits are unremarkable. Mild bilateral maxillary, bilateral ethmoid, and right sphenoid sinus mucosal thickening is noted. Mastoid air cells are clear. Major intracranial vascular flow voids are preserved. A joint effusion is noted at the right C1-2 articulation. IMPRESSION: 1. Possible punctate acute small vessel infarct in the right corona radiata. 2. No evidence of intracranial metastatic disease on this unenhanced study. 3. Mild chronic small vessel ischemic disease. Electronically Signed   By: Logan Bores M.D.   On: 10/04/2015 11:51   Mm Diag Breast Tomo Bilateral  09/18/2015  CLINICAL DATA:  Patient has stage IV breast carcinoma. She underwent right lumpectomy in April 2012 had adjuvant radiation therapy and is currently on chemotherapy. Patient presents with swelling and firmness of the right breast. She also has diffuse swelling of the right arm consistent with left edema. EXAM: DIGITAL DIAGNOSTIC BILATERAL MAMMOGRAM WITH 3D TOMOSYNTHESIS AND CAD COMPARISON:  Previous exam(s). ACR Breast Density Category b: There are scattered areas of fibroglandular density. FINDINGS: On the right, the focal opacity in the surgical bed has decreased and is now mixed attenuation. There is architectural distortion consistent with postsurgical scarring. Well-defined fat necrosis lies along the anterior margin of this reflected by egg shell calcifications. The suspensory ligaments are thickened throughout the right breast. The skin is thickened most evident anteriorly and inferiorly. There are no new masses or other areas of architectural distortion. There are no suspicious calcifications. The skin thickening is similar to the prior 2 exams. On the left, architectural distortion in the upper breast reflecting a previous, by report,  benign, surgical excision, has decreased in size from the most recent prior exam. There is associated dystrophic calcifications. A stable smooth benign appearing mass is noted  in the medial anterior breast likely a cyst. This has been stable for several years. There are no other masses. There are no other areas of architectural distortion. There are no suspicious left breast calcifications. Mammographic images were processed with CAD. IMPRESSION: The imaging appearance is consistent with benign postsurgical change and benign lymphedema with the skin thickening and suspensory ligament thickening similar to the recent prior studies. It should be noted that clinically, the appearance the breast could either be due to lymphedema or inflammatory breast carcinoma. RECOMMENDATION: Diagnostic mammography in 1 year, per standard post lumpectomy protocol, if this would alter clinical management. Consider punch biopsy of the skin if a diagnosis of inflammatory breast carcinoma would alter clinical management. I have discussed the findings and recommendations with the patient. Results were also provided in writing at the conclusion of the visit. If applicable, a reminder letter will be sent to the patient regarding the next appointment. BI-RADS CATEGORY  2: Benign. Electronically Signed   By: Lajean Manes M.D.   On: 09/18/2015 11:59     ASSESSMENT: 75 y.o.  [place] woman with stage IV [bone only] breast cancer as of May 2014  (1)  status post right lumpectomy and axillary lymph node dissection 11/17/2010 for a pT2 pN1, stage IIB invasive ductal carcinoma, grade 2, estrogen and progesterone receptor positive, HER-2 negative,   (2) Oncotype DX recurrence score of 27 predicting a risk of distant recurrence of 18% with 5 years of tamoxifen (intermediate score);   (3)  status post radiation completed July of 2012,   (4) started anastrozole July 2012, discontinued June 2014 with evidence of metastatic spread to bone  (5) Chronic lymphedema in the right upper extremity.  (6) pathologic fracture of the left humerus along apparent lytic lesion, with no other bone lesions per bone scan  12/12/2012  (7) on 12/27/2012 underwent #1 intramedullary nail left pathologic proximal humerus fracture  #2 left shoulder rotator cuff repair  #3 left shoulder open bone biopsy with pathology showing metastatic breast adenocarcinoma, estrogen receptor 100% positive, progesterone receptor and HER-2 negative, with an MIB-1 of 26%. PET scan 01/13/2013 showed no other areas of disease   (8) status post 35 Gy to the left proximal humerus completed 03/13/2013(  (9) fulvestrant, started on 01/23/2013, ongoing  (10) zolendronic acid given monthly, first dose 01/23/2013; changed to denosumab as of August 2015 because of access problems, being given every three months  (11) multiple liver lesions noted on scans April 2015   (12) letrozole added May 2015, stopped 04/18/14 because of progression in her liver noted on a chest CT performed on 03/14/14  (13) everolimus and exemestane started 04/19/14  (a) MRI of the abdomen 07/12/15 showed stable disease  PLAN:  From a breast cancer point of view Ayan is stable. She has no major issues with the exemestane or everolimus. Her next fulvestrant injection is due today. Denosumab has recently been moved out to every 12 weeks, so this till next be given in May.   I believe the rash to her face looks like impetigo. I am prescribing her a course of keflex 568m BID. If this does not clear it up, she is determined to go to a dermatologist.   She is already scheduled for follow up with her dentist for right mouth pain tomorrow.   TKayllawill return in  2 months for follow up. Prior to this visit she will have a repeat liver MRI (without contrast due to allergy). She understands and agrees with this plan. She knows the goal of treatment in her case is control. She has been encouraged to call with any issues that might arise before her next visit here.   Total time spent in appointment was 30 minutes with greater than 50% of the time was spent face to face  with the patient.    Laurie Panda, NP  10/17/2015

## 2015-10-17 NOTE — Telephone Encounter (Signed)
Gave patient avs report and appointments for April and May.  °

## 2015-10-18 LAB — CANCER ANTIGEN 27.29: CA 27.29: 236.2 U/mL — ABNORMAL HIGH (ref 0.0–38.6)

## 2015-10-23 ENCOUNTER — Ambulatory Visit: Payer: Medicare Other | Admitting: Physical Therapy

## 2015-10-23 DIAGNOSIS — M75102 Unspecified rotator cuff tear or rupture of left shoulder, not specified as traumatic: Principal | ICD-10-CM

## 2015-10-23 DIAGNOSIS — M25512 Pain in left shoulder: Secondary | ICD-10-CM

## 2015-10-23 DIAGNOSIS — Z789 Other specified health status: Secondary | ICD-10-CM

## 2015-10-23 DIAGNOSIS — M12512 Traumatic arthropathy, left shoulder: Secondary | ICD-10-CM | POA: Diagnosis not present

## 2015-10-23 DIAGNOSIS — Z9181 History of falling: Secondary | ICD-10-CM

## 2015-10-23 DIAGNOSIS — M12812 Other specific arthropathies, not elsewhere classified, left shoulder: Secondary | ICD-10-CM

## 2015-10-23 DIAGNOSIS — R29898 Other symptoms and signs involving the musculoskeletal system: Secondary | ICD-10-CM

## 2015-10-23 DIAGNOSIS — Z7409 Other reduced mobility: Secondary | ICD-10-CM

## 2015-10-23 NOTE — Therapy (Signed)
[place], [place], [address] Phone: [phone]   Fax:  [phone]  Physical Therapy Treatment  Patient Details  Name: Christy Harrell MRN: [id number] Date of Birth: Feb 08, 1941 Referring Provider: Tania Ade  Encounter Date: 10/23/2015      PT End of Session - 10/23/15 1057    Visit Number 10   Number of Visits 20   Date for PT Re-Evaluation 11/20/15   PT Start Time 1031   PT Stop Time 1115   PT Time Calculation (min) 44 min   Activity Tolerance Patient tolerated treatment well   Behavior During Therapy [facility] for tasks assessed/performed      Past Medical History  Diagnosis Date  . Club foot   . Arthritis   . Thyroid disease   . Hyperlipidemia   . Osteoporosis   . Hypertension   . GERD (gastroesophageal reflux disease)     watches diet  . Neuromuscular disorder (HCC)     lt arm numb sometimes  . Wears glasses   . History of radiation therapy 02/22/13- 03/13/13    left proximal humerus 3500 cGy 14 sessions  . Lymphedema     right arm  . Diabetes mellitus     metformin  . Cancer [facility])     right breast  . 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 Metastasis from malignant tumor of breast [facility])     liver    Past Surgical History  Procedure Laterality Date  . Cesarean section    . Thyroidectomy  2002  . Foot surgery      as a child for club foot  . Breast surgery  2012    rt lump-16 nodes-in [place]  . Cesarean section    . Colonoscopy    . Dilation and curettage of uterus    . Humerus im nail Left 12/27/2012    Procedure: INTRAMEDULLARY (IM) NAIL HUMERAL LEFT PATHOLOGIC FRACTURE;  Surgeon: Nita Sells, MD;  Location: [facility];  Service: Orthopedics;  Laterality: Left;    There were no vitals filed for this visit.  Visit Diagnosis:  Rotator cuff tear arthropathy, left  Pain in joint of left shoulder  Weakness of left  arm  Impaired mobility and ADLs  Patient had 2 or more falls in past year      Subjective Assessment - 10/23/15 1036    Subjective Renewed last visit.  Pt with infection in tooth, feels bad today because of that.  Shoulder is fine today.    Currently in Pain? No/denies                         [facility] Adult PT Treatment/Exercise - 10/23/15 1039    Shoulder Exercises: Supine   External Rotation AAROM;Left;10 reps   Flexion AROM;Strengthening;Both;20 reps   ABduction AAROM;Left;10 reps   Other Supine Exercises chest press with cane and ER with cane x 10 each    Other Supine Exercises supine cane ex. see above    Shoulder Exercises: Standing   External Rotation Strengthening;Left;10 reps   Internal Rotation Strengthening;Left;10 reps   Flexion Strengthening;Left;5 reps;Theraband;Limitations   Theraband Level (Shoulder Flexion) Level 1 (Yellow)   Shoulder Flexion Weight (lbs) 5 x elbow bent and 5 x elbow ext   Extension Strengthening;Both;20 reps;Theraband   Theraband Level (Shoulder Extension) Level 1 (Yellow)   Row Strengthening;Right;Left;20 reps;Theraband   Theraband Level (Shoulder  Row) Level 1 (Yellow)   Other Standing Exercises adduction yellow x 20    Shoulder Exercises: Pulleys   Flexion 2 minutes   ABduction 2 minutes   Moist Heat Therapy   Number Minutes Moist Heat 15 Minutes   Moist Heat Location Shoulder                PT Education - 10/23/15 1055    Education provided Yes   Education Details strengthening in standing   Person(s) Educated Patient   Methods Explanation;Demonstration   Comprehension Verbalized understanding          PT Short Term Goals - 10/10/15 1351    PT SHORT TERM GOAL #1   Title Pt will be I with initial HEP for continued strengthening and mobility by 09/26/15   Time 4   Period Weeks   Status Achieved   PT SHORT TERM GOAL #2   Title R shoulder PROM will improve by 10 degrees pain-free for improved overhead  reaching activities by 09/26/15.   Time 4   Period Weeks   Status Achieved           PT Long Term Goals - 10/10/15 1351    PT LONG TERM GOAL #1   Title Pt will be indep with advanced HEP by 11/20/15.    Time 6   Status Revised   PT LONG TERM GOAL #2   Title FOTO score will improve from 59% limited to 39% to demo improved function and mobility by 11/20/15.    Baseline 10/10/15: Improved from 59% limited at intake to 54% limited today.    Time 6   Period Weeks   Status Revised   PT LONG TERM GOAL #3   Title Updated 10/10/15: L shoulder strength will improve to 3/5 in order for pt to be able to lift 1# overhead x 10 reps without pain in order to return to pain-free functional activities for lifting overhead by 11/20/15.   Time 6   Period Weeks   Status Revised   PT LONG TERM GOAL #4   Title New 10/10/15: L shoulder AROM flexion will improve to 80 degrees in order to reach overhead for grooming her hair by 11/20/15.    Time 6   Period Weeks   Status New               Plan - 10/23/15 1300    Clinical Impression Statement Patient able to do a modified strengthening routine for L. UE strength.  For flexion she needs manual assist with maintaining scapular neutral .  Able to maintain a low level of L UE pain during all exercises.     PT Next Visit Plan Continue AAROM in standing and AROM ther ex, pain - free. isometrics    PT Home Exercise Plan table slides/ AAROM flexion and ER. supine cane   Consulted and Agree with Plan of Care Patient        Problem List Patient Active Problem List   Diagnosis Date Noted  . Eczema, dyshidrotic 06/19/2015  . Localized edema 06/19/2015  . 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 Allergy history, radiographic dye 11/21/2014  . Metastases to the liver ([place]) 07/17/2014  . Routine general medical examination at a health care facility 07/10/2014  . Situational anxiety 04/18/2014  . Severe obesity (BMI >=  40) ([place]) 01/15/2014  . Coronary artery calcification seen on CAT scan 12/25/2013  . Atherosclerosis of aorta ([place]) 12/02/2013  .  Unspecified constipation 08/21/2013  . Breast cancer of lower-outer quadrant of left female breast ([place]) 05/09/2013  . Venous stasis dermatitis 03/22/2013  . Bone metastasis ([place]) 01/31/2013  . Unspecified vitamin D deficiency 12/23/2012  . Low back pain 12/01/2012  . Lymphedema of upper extremity following lymphadenectomy 02/15/2012  . Spinal stenosis in cervical region 10/28/2011  . Cervical spondylitis with radiculitis ([place]) 10/28/2011  . Hyperlipidemia with target LDL less than 100 09/03/2011  . Type 2 diabetes, uncontrolled, with neuropathy ([place]) 09/03/2011  . Osteoporosis 09/03/2011  . Hypothyroidism 09/03/2011  . Generalized OA 09/03/2011    PAA,JENNIFER 10/23/2015, 1:09 PM  [facility] [address] [place], [place], [address] Phone: [phone]   Fax:  [phone]  Name: Khalie Petronella MRN: [id number] Date of Birth: January 21, 1941    Raeford Razor, PT 10/23/2015 1:09 PM Phone: [phone] Fax: [phone]

## 2015-10-25 ENCOUNTER — Ambulatory Visit: Payer: Medicare Other | Admitting: Physical Therapy

## 2015-10-25 DIAGNOSIS — M12512 Traumatic arthropathy, left shoulder: Secondary | ICD-10-CM | POA: Diagnosis not present

## 2015-10-25 DIAGNOSIS — Z7409 Other reduced mobility: Secondary | ICD-10-CM

## 2015-10-25 DIAGNOSIS — M75102 Unspecified rotator cuff tear or rupture of left shoulder, not specified as traumatic: Principal | ICD-10-CM

## 2015-10-25 DIAGNOSIS — R29898 Other symptoms and signs involving the musculoskeletal system: Secondary | ICD-10-CM

## 2015-10-25 DIAGNOSIS — M25512 Pain in left shoulder: Secondary | ICD-10-CM

## 2015-10-25 DIAGNOSIS — M12812 Other specific arthropathies, not elsewhere classified, left shoulder: Secondary | ICD-10-CM

## 2015-10-25 NOTE — Therapy (Signed)
[place], [place], [address] Phone: [phone]   Fax:  [phone]  Physical Therapy Treatment  Patient Details  Name: Christy Harrell MRN: [id number] Date of Birth: 1940-12-03 Referring Provider: Tania Harrell  Encounter Date: 10/25/2015      PT End of Session - 10/25/15 1036    Visit Number 11   Number of Visits 20   Date for PT Re-Evaluation 11/20/15   PT Start Time 1017   PT Stop Time 1118   PT Time Calculation (min) 61 min   Activity Tolerance Patient tolerated treatment well   Behavior During Therapy [facility] for tasks assessed/performed      Past Medical History  Diagnosis Date  . Club foot   . Arthritis   . Thyroid disease   . Hyperlipidemia   . Osteoporosis   . Hypertension   . GERD (gastroesophageal reflux disease)     watches diet  . Neuromuscular disorder (HCC)     lt arm numb sometimes  . Wears glasses   . History of radiation therapy 02/22/13- 03/13/13    left proximal humerus 3500 cGy 14 sessions  . Lymphedema     right arm  . Diabetes mellitus     metformin  . Cancer [facility])     right breast  . 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 Metastasis from malignant tumor of breast [facility])     liver    Past Surgical History  Procedure Laterality Date  . Cesarean section    . Thyroidectomy  2002  . Foot surgery      as a child for club foot  . Breast surgery  2012    rt lump-16 nodes-in [place]  . Cesarean section    . Colonoscopy    . Dilation and curettage of uterus    . Humerus im nail Left 12/27/2012    Procedure: INTRAMEDULLARY (IM) NAIL HUMERAL LEFT PATHOLOGIC FRACTURE;  Surgeon: Christy Sells, MD;  Location: [facility];  Service: Orthopedics;  Laterality: Left;    There were no vitals filed for this visit.  Visit Diagnosis:  Rotator cuff tear arthropathy, left  Pain in joint of left shoulder  Weakness of left  arm  Impaired mobility and ADLs      Subjective Assessment - 10/25/15 1035    Subjective Tooth feels better today, my shoulder pain is minimal today.    Currently in Pain? No/denies            [facility] Adult PT Treatment/Exercise - 10/25/15 1038    Shoulder Exercises: Sidelying   External Rotation AAROM;Strengthening;Left;15 reps;Weights   External Rotation Weight (lbs) 1 lb too difficult   Flexion Strengthening;Left;10 reps;Weights   Flexion Weight (lbs) 1   ABduction Strengthening;Left;10 reps   ABduction Weight (lbs) none, PT assist   Other Sidelying Exercises scap depression assist by PT    Shoulder Exercises: Pulleys   Flexion 3 minutes   ABduction 3 minutes   Shoulder Exercises: ROM/Strengthening   Other ROM/Strengthening Exercises UE ranger standing 10 inches and 6 inches circles, abduction, adduction and flexion with weight shiift.     Other ROM/Strengthening Exercises bicep curls 2 x10, 2 lbs   Moist Heat Therapy   Number Minutes Moist Heat 15 Minutes   Moist Heat Location Shoulder   Manual Therapy   Joint Mobilization gentle joint distraction , Inf- post glide grade 1-3    Passive ROM PROM  L shoulder all directions                   PT Short Term Goals - 10/10/15 1351    PT SHORT TERM GOAL #1   Title Pt will be I with initial HEP for continued strengthening and mobility by 09/26/15   Time 4   Period Weeks   Status Achieved   PT SHORT TERM GOAL #2   Title R shoulder PROM will improve by 10 degrees pain-free for improved overhead reaching activities by 09/26/15.   Time 4   Period Weeks   Status Achieved           PT Long Term Goals - 10/25/15 1109    PT LONG TERM GOAL #1   Title Pt will be indep with advanced HEP by 11/20/15.    Status On-going   PT LONG TERM GOAL #2   Title FOTO score will improve from 59% limited to 39% to demo improved function and mobility by 11/20/15.    Status On-going   PT LONG TERM GOAL #3   Title Updated 10/10/15: L  shoulder strength will improve to 3/5 in order for pt to be able to lift 1# overhead x 10 reps without pain in order to return to pain-free functional activities for lifting overhead by 11/20/15.   Status On-going   PT LONG TERM GOAL #4   Title New 10/10/15: L shoulder AROM flexion will improve to 80 degrees in order to reach overhead for grooming her hair by 11/20/15.    Status On-going               Plan - 10/25/15 1108    Clinical Impression Statement Patient able to work strength in standing, seated and sidelying with PT assist and no Increase in pain.    PT Next Visit Plan measure seated AAROM and cont to progress with pain minimized ex.    PT Home Exercise Plan table slides/ AAROM flexion and ER. supine cane, bands in standing for scap strength   Consulted and Agree with Plan of Care Patient        Problem List Patient Active Problem List   Diagnosis Date Noted  . Eczema, dyshidrotic 06/19/2015  . Localized edema 06/19/2015  . 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 Allergy history, radiographic dye 11/21/2014  . Metastases to the liver ([place]) 07/17/2014  . Routine general medical examination at a health care facility 07/10/2014  . Situational anxiety 04/18/2014  . Severe obesity (BMI >= 40) ([place]) 01/15/2014  . Coronary artery calcification seen on CAT scan 12/25/2013  . Atherosclerosis of aorta ([place]) 12/02/2013  . Unspecified constipation 08/21/2013  . Breast cancer of lower-outer quadrant of left female breast ([place]) 05/09/2013  . Venous stasis dermatitis 03/22/2013  . Bone metastasis ([place]) 01/31/2013  . Unspecified vitamin D deficiency 12/23/2012  . Low back pain 12/01/2012  . Lymphedema of upper extremity following lymphadenectomy 02/15/2012  . Spinal stenosis in cervical region 10/28/2011  . Cervical spondylitis with radiculitis ([place]) 10/28/2011  . Hyperlipidemia with target LDL less than 100 09/03/2011  . Type 2 diabetes,  uncontrolled, with neuropathy ([place]) 09/03/2011  . Osteoporosis 09/03/2011  . Hypothyroidism 09/03/2011  . Generalized OA 09/03/2011    Christy Harrell 10/25/2015, 11:11 AM  [facility] [address] [place], [place], [address] Phone: [phone]   Fax:  [phone]  Name: Christy Harrell MRN: [id number] Date of Birth: 08-Dec-1940  Raeford Razor, PT 10/25/2015 11:11 AM Phone: [phone] Fax: [phone]

## 2015-10-29 ENCOUNTER — Ambulatory Visit: Payer: Medicare Other

## 2015-10-29 DIAGNOSIS — M12812 Other specific arthropathies, not elsewhere classified, left shoulder: Secondary | ICD-10-CM

## 2015-10-29 DIAGNOSIS — M75102 Unspecified rotator cuff tear or rupture of left shoulder, not specified as traumatic: Principal | ICD-10-CM

## 2015-10-29 DIAGNOSIS — Z7409 Other reduced mobility: Secondary | ICD-10-CM

## 2015-10-29 DIAGNOSIS — R29898 Other symptoms and signs involving the musculoskeletal system: Secondary | ICD-10-CM

## 2015-10-29 DIAGNOSIS — M12512 Traumatic arthropathy, left shoulder: Secondary | ICD-10-CM | POA: Diagnosis not present

## 2015-10-29 DIAGNOSIS — Z9181 History of falling: Secondary | ICD-10-CM

## 2015-10-29 DIAGNOSIS — M25512 Pain in left shoulder: Secondary | ICD-10-CM

## 2015-10-29 NOTE — Therapy (Signed)
[place], [place], [address] Phone: [phone]   Fax:  [phone]  Physical Therapy Treatment  Patient Details  Name: Christy Harrell MRN: [id number] Date of Birth: [birth year] Referring Provider: Tania Ade  Encounter Date: 10/29/2015      PT End of Session - 10/29/15 1025    Visit Number 12   Number of Visits 20   Date for PT Re-Evaluation 11/20/15   PT Start Time 1020   PT Stop Time 1115   PT Time Calculation (min) 55 min   Activity Tolerance Patient tolerated treatment well   Behavior During Therapy [facility] for tasks assessed/performed      Past Medical History  Diagnosis Date  . Club foot   . Arthritis   . Thyroid disease   . Hyperlipidemia   . Osteoporosis   . Hypertension   . GERD (gastroesophageal reflux disease)     watches diet  . Neuromuscular disorder (HCC)     lt arm numb sometimes  . Wears glasses   . History of radiation therapy 02/22/13- 03/13/13    left proximal humerus 3500 cGy 14 sessions  . Lymphedema     right arm  . Diabetes mellitus     metformin  . Cancer [facility])     right breast  . 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 Metastasis from malignant tumor of breast [facility])     liver    Past Surgical History  Procedure Laterality Date  . Cesarean section    . Thyroidectomy  2002  . Foot surgery      as a child for club foot  . Breast surgery  2012    rt lump-16 nodes-in [place]  . Cesarean section    . Colonoscopy    . Dilation and curettage of uterus    . Humerus im nail Left 12/27/2012    Procedure: INTRAMEDULLARY (IM) NAIL HUMERAL LEFT PATHOLOGIC FRACTURE;  Surgeon: Nita Sells, MD;  Location: [facility];  Service: Orthopedics;  Laterality: Left;    There were no vitals filed for this visit.  Visit Diagnosis:  Rotator cuff tear arthropathy, left  Pain in joint of left shoulder  Weakness of left  arm  Impaired mobility and ADLs  Patient had 2 or more falls in past year      Subjective Assessment - 10/29/15 1023    Subjective I took a pain pill today because I got a work-out last time. Pt denies pain, but did have a pain pill this AM.    Currently in Pain? No/denies   Pain Score 0-No pain   Pain Location Shoulder   Pain Orientation Left            [facility] PT Assessment - 10/29/15 0001    AROM   Left Shoulder Flexion 55 Degrees  110 AAROM flexion    Left Shoulder ABduction 55 Degrees   Left Shoulder Internal Rotation --  FIR L3    Left Shoulder External Rotation --  FER C5   PROM   Left Shoulder Flexion 130 Degrees   Left Shoulder ABduction 120 Degrees   Left Shoulder Internal Rotation 63 Degrees  @ 90 degrees ABD   Left Shoulder External Rotation 45 Degrees  @ 90 degrees ABD   Strength   Left Shoulder Flexion 2-/5   Left Shoulder ABduction 2-/5   Left Shoulder Internal Rotation 3+/5   Left Shoulder External  Rotation 2-/5                     [facility] Adult PT Treatment/Exercise - 10/29/15 0001    Elbow Exercises   Elbow Flexion Strengthening;Right;20 reps  2 lbs.    Shoulder Exercises: Seated   External Rotation AAROM;20 reps;Other (comment)  x 2 sets, 1 x with PT assist, 1x with cane to assist.    Other Seated Exercises UE RANGER: circles and flexion 10 x each.    Shoulder Exercises: Sidelying   External Rotation AAROM;Strengthening;Left;15 reps;Weights   External Rotation Limitations PT assisted   Shoulder Exercises: Pulleys   Flexion 3 minutes   ABduction 3 minutes   Moist Heat Therapy   Number Minutes Moist Heat 15 Minutes   Moist Heat Location Shoulder   Manual Therapy   Passive ROM PROM L shoulder all directions                   PT Short Term Goals - 10/10/15 1351    PT SHORT TERM GOAL #1   Title Pt will be I with initial HEP for continued strengthening and mobility by 09/26/15   Time 4   Period Weeks   Status  Achieved   PT SHORT TERM GOAL #2   Title R shoulder PROM will improve by 10 degrees pain-free for improved overhead reaching activities by 09/26/15.   Time 4   Period Weeks   Status Achieved           PT Long Term Goals - 10/25/15 1109    PT LONG TERM GOAL #1   Title Pt will be indep with advanced HEP by 11/20/15.    Status On-going   PT LONG TERM GOAL #2   Title FOTO score will improve from 59% limited to 39% to demo improved function and mobility by 11/20/15.    Status On-going   PT LONG TERM GOAL #3   Title Updated 10/10/15: L shoulder strength will improve to 3/5 in order for pt to be able to lift 1# overhead x 10 reps without pain in order to return to pain-free functional activities for lifting overhead by 11/20/15.   Status On-going   PT LONG TERM GOAL #4   Title New 10/10/15: L shoulder AROM flexion will improve to 80 degrees in order to reach overhead for grooming her hair by 11/20/15.    Status On-going               Plan - 10/29/15 1026    Clinical Impression Statement Progressed sidelying: ABD with elbow flexed to 90, straight elbow shoulder ABD, SH ER with AAROM from PT. Pt requires AAROM from PT or cane to perform ER. Reassessed PROM and AROM measures with some improvement ( 5 degrees)  in AROM flexion and PROM ABD.   Good improvement noted in FIR too, and pt attributes it to [facility] stretch with cane.    PT Next Visit Plan cont to progress with pain minimized ex. Add sidelying ER and ABD next visit to HEP.    PT Home Exercise Plan table slides/ AAROM flexion and ER. supine cane, bands in standing for scap strength   Consulted and Agree with Plan of Care Patient        Problem List Patient Active Problem List   Diagnosis Date Noted  . Eczema, dyshidrotic 06/19/2015  . Localized edema 06/19/2015  . 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 Allergy history,  radiographic dye 11/21/2014  . Metastases to the liver ([place])  07/17/2014  . Routine general medical examination at a health care facility 07/10/2014  . Situational anxiety 04/18/2014  . Severe obesity (BMI >= 40) ([place]) 01/15/2014  . Coronary artery calcification seen on CAT scan 12/25/2013  . Atherosclerosis of aorta ([employer]) 12/02/2013  . Unspecified constipation 08/21/2013  . Breast cancer of lower-outer quadrant of left female breast ([place]) 05/09/2013  . Venous stasis dermatitis 03/22/2013  . Bone metastasis ([place]) 01/31/2013  . Unspecified vitamin D deficiency 12/23/2012  . Low back pain 12/01/2012  . Lymphedema of upper extremity following lymphadenectomy 02/15/2012  . Spinal stenosis in cervical region 10/28/2011  . Cervical spondylitis with radiculitis ([place]) 10/28/2011  . Hyperlipidemia with target LDL less than 100 09/03/2011  . Type 2 diabetes, uncontrolled, with neuropathy ([place]) 09/03/2011  . Osteoporosis 09/03/2011  . Hypothyroidism 09/03/2011  . Generalized OA 09/03/2011    Dollene Cleveland, PT 10/29/2015, 10:58 AM  [facility] [address] [place], [place], [address] Phone: [phone]   Fax:  [phone]  Name: Christy Harrell MRN: [id number] Date of Birth: 1941/04/24

## 2015-10-30 ENCOUNTER — Ambulatory Visit (HOSPITAL_COMMUNITY): Admission: RE | Admit: 2015-10-30 | Payer: Medicare Other | Source: Ambulatory Visit

## 2015-10-31 ENCOUNTER — Ambulatory Visit: Payer: Medicare Other | Admitting: Physical Therapy

## 2015-11-06 ENCOUNTER — Ambulatory Visit: Payer: Medicare Other | Admitting: Physical Therapy

## 2015-11-06 DIAGNOSIS — R29898 Other symptoms and signs involving the musculoskeletal system: Secondary | ICD-10-CM

## 2015-11-06 DIAGNOSIS — M12512 Traumatic arthropathy, left shoulder: Secondary | ICD-10-CM | POA: Diagnosis not present

## 2015-11-06 DIAGNOSIS — M12812 Other specific arthropathies, not elsewhere classified, left shoulder: Secondary | ICD-10-CM

## 2015-11-06 DIAGNOSIS — M75102 Unspecified rotator cuff tear or rupture of left shoulder, not specified as traumatic: Principal | ICD-10-CM

## 2015-11-06 DIAGNOSIS — Z7409 Other reduced mobility: Secondary | ICD-10-CM

## 2015-11-06 DIAGNOSIS — Z9181 History of falling: Secondary | ICD-10-CM

## 2015-11-06 DIAGNOSIS — M25512 Pain in left shoulder: Secondary | ICD-10-CM

## 2015-11-06 NOTE — Patient Instructions (Signed)
External Rotation (Assistive)    Seated with elbow bent at right angle and held against side, slide arm on table surface in an outward arc. Repeat __10-20__ times. Do __2__ sessions per day. Use pillow case to decrease friction.   External Rotation (Eccentric), Active    Sit, affected arm supported on table, elbow at 90, forearm forward. Quickly lift forearm of affected arm. Slowly lower arm for 3-5 seconds to table. _10__ reps per set, _3__ sets per day, __7_ days per week. Add __1_ lbs when you achieve _10__ repetitions.  ABDUCTION: Side-Lying (Active)    Lie on right side. Lift top arm as high as possible, keeping elbow straight. SLOWLY LOWER AND HOLD AT MID RANGE. Use 1___ lbs. Complete _2_ sets of __10_ repetitions. Perform ___2 sessions per day.  Flexion - Supine (Dumbbell)    Lie with arms along body. Lift arms and shoulders straight up, palms forward. SLOWLY LOWER AND PAUSE AND HOLD AT MID RANGE Repeat __10__ times per set. Do __2__ sets per session. Do __2__ sessions per week. Use __1__ lb weights.

## 2015-11-07 NOTE — Therapy (Addendum)
[place], [place], [address] Phone: [phone]   Fax:  [phone]  Physical Therapy Treatment/Discharge Patient Details  Name: Christy Harrell MRN: [id number] Date of Birth: [birth year] Referring Provider: Tania Ade  Encounter Date: 11/06/2015      PT End of Session - 11/06/15 1148    Visit Number 13   Number of Visits 20   Date for PT Re-Evaluation 11/20/15   PT Start Time 1140   PT Stop Time 1230   PT Time Calculation (min) 50 min      Past Medical History  Diagnosis Date  . Club foot   . Arthritis   . Thyroid disease   . Hyperlipidemia   . Osteoporosis   . Hypertension   . GERD (gastroesophageal reflux disease)     watches diet  . Neuromuscular disorder (HCC)     lt arm numb sometimes  . Wears glasses   . History of radiation therapy 02/22/13- 03/13/13    left proximal humerus 3500 cGy 14 sessions  . Lymphedema     right arm  . Diabetes mellitus     metformin  . Cancer [facility])     right breast  . Metastasis from malignant tumor of breast ([place])     left humerous  . Metastasis from malignant tumor of breast [facility])     liver    Past Surgical History  Procedure Laterality Date  . Cesarean section    . Thyroidectomy  2002  . Foot surgery      as a child for club foot  . Breast surgery  2012    rt lump-16 nodes-in [place]  . Cesarean section    . Colonoscopy    . Dilation and curettage of uterus    . Humerus im nail Left 12/27/2012    Procedure: INTRAMEDULLARY (IM) NAIL HUMERAL LEFT PATHOLOGIC FRACTURE;  Surgeon: Nita Sells, MD;  Location: [place];  Service: Orthopedics;  Laterality: Left;    There were no vitals filed for this visit.  Visit Diagnosis:  Rotator cuff tear arthropathy, left  Pain in joint of left shoulder  Weakness of left arm  Impaired mobility and ADLs  Patient had 2 or more falls in past year      Subjective Assessment -  11/06/15 1146    Subjective Took my pain meds. Not much pain 6/10. Mouth is in pain. Having tooth pulled Monday.    Currently in Pain? Yes   Pain Score 6    Pain Location Shoulder   Pain Orientation Left   Pain Descriptors / Indicators Aching;Throbbing   Aggravating Factors  over doing it with exercises   Pain Relieving Factors pain meds     Treatment:   Sidelying abduction-pt unable to perfrom full sidelying on right due to UE lymphedema. Scaption x 20 with mid range hold for 5 seconds Sidelying ER with pt unable to perform unassisted AROM. Moved to sitting with elbow on tray table in neutral abduction, able to perform IR/ER slides on pillow case x 20. Also with elbow ob table in abduction and lifting lower arm off tray table x 20 with tactile cues for correct technique.  Seated Bicep curls 2# x 20 with pt having difficulty keeping arm in neutral, it prefers to internally rotate. Pulleys seated  3 minutes flexion, abduction.                PT Education - 11/06/15 1234    Education  provided Yes   Education Details AROM shoulder    Person(s) Educated Patient   Methods Explanation;Handout   Comprehension Verbalized understanding          PT Short Term Goals - 10/10/15 1351    PT SHORT TERM GOAL #1   Title Pt will be I with initial HEP for continued strengthening and mobility by 09/26/15   Time 4   Period Weeks   Status Achieved   PT SHORT TERM GOAL #2   Title R shoulder PROM will improve by 10 degrees pain-free for improved overhead reaching activities by 09/26/15.   Time 4   Period Weeks   Status Achieved           PT Long Term Goals - 10/25/15 1109    PT LONG TERM GOAL #1   Title Pt will be indep with advanced HEP by 11/20/15.    Status On-going   PT LONG TERM GOAL #2   Title FOTO score will improve from 59% limited to 39% to demo improved function and mobility by 11/20/15.    Status On-going   PT LONG TERM GOAL #3   Title Updated 10/10/15: L shoulder  strength will improve to 3/5 in order for pt to be able to lift 1# overhead x 10 reps without pain in order to return to pain-free functional activities for lifting overhead by 11/20/15.   Status On-going   PT LONG TERM GOAL #4   Title New 10/10/15: L shoulder AROM flexion will improve to 80 degrees in order to reach overhead for grooming her hair by 11/20/15.    Status On-going               Plan - 11/06/15 1029    Clinical Impression Statement Added AROM exercises to HEP including sidelying abduction with mid range hold, as well as seated ER with elbow on table at neutral and in abduction. Unable to perform ER in sidelying. No increased pain post session.    PT Next Visit Plan cont to progress with pain minimized ex. Review AROM HEP issued this visit.         Problem List Patient Active Problem List   Diagnosis Date Noted  . Eczema, dyshidrotic 06/19/2015  . Localized edema 06/19/2015  . 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 Allergy history, radiographic dye 11/21/2014  . Metastases to the liver (HCC) 07/17/2014  . Routine general medical examination at a health care facility 07/10/2014  . Situational anxiety 04/18/2014  . Severe obesity (BMI >= 40) (HCC) 01/15/2014  . Coronary artery calcification seen on CAT scan 12/25/2013  . Atherosclerosis of aorta (HCC) 12/02/2013  . Unspecified constipation 08/21/2013  . Breast cancer of lower-outer quadrant of left female breast (HCC) 05/09/2013  . Venous stasis dermatitis 03/22/2013  . Bone metastasis (HCC) 01/31/2013  . Unspecified vitamin D deficiency 12/23/2012  . Low back pain 12/01/2012  . Lymphedema of upper extremity following lymphadenectomy 02/15/2012  . Spinal stenosis in cervical region 10/28/2011  . Cervical spondylitis with radiculitis (HCC) 10/28/2011  . Hyperlipidemia with target LDL less than 100 09/03/2011  . Type 2 diabetes, uncontrolled, with neuropathy (HCC)  09/03/2011  . Osteoporosis 09/03/2011  . Hypothyroidism 09/03/2011  . Generalized OA 09/03/2011    Sherrie Mustache, PTA 11/07/2015, 10:31 AM  [facility] [address] [place], [place], [address] Phone: [phone]   Fax:  [phone]  Name: Christy Harrell MRN: [id number] Date of  Birth: 12-30-1940    PHYSICAL THERAPY DISCHARGE SUMMARY  Visits from Start of Care: 13  Current functional level related to goals / functional outcomes: Unknown, see above for goals    Remaining deficits: UNknown   Education / Equipment: HEP, RICE, posture Plan: Patient agrees to discharge.  Patient goals were not met. Patient is being discharged due to not returning since the last visit.  ?????    Raeford Razor, PT 07/15/16 1:54 PM Phone: [phone] Fax: [phone]

## 2015-11-08 ENCOUNTER — Telehealth: Payer: Self-pay | Admitting: Internal Medicine

## 2015-11-08 NOTE — Telephone Encounter (Signed)
Spoke with pt. Informed of results of brain scan on 10/04/15+

## 2015-11-08 NOTE — Telephone Encounter (Signed)
Patient is requesting call back with CT or MRI results from Feb.

## 2015-11-12 MED FILL — EXEMESTANE 25 MG TABLET: 25 | 30 days supply | Qty: 30 | Fill #2

## 2015-11-12 MED FILL — *AFINITOR 10MG TAB: 10 | 28 days supply | Qty: 28 | Fill #0

## 2015-11-14 ENCOUNTER — Ambulatory Visit (HOSPITAL_BASED_OUTPATIENT_CLINIC_OR_DEPARTMENT_OTHER): Payer: Medicare Other

## 2015-11-14 ENCOUNTER — Other Ambulatory Visit (HOSPITAL_BASED_OUTPATIENT_CLINIC_OR_DEPARTMENT_OTHER): Payer: Medicare Other

## 2015-11-14 VITALS — BP 132/46 | HR 64 | Temp 98.6°F

## 2015-11-14 DIAGNOSIS — C7951 Secondary malignant neoplasm of bone: Secondary | ICD-10-CM

## 2015-11-14 DIAGNOSIS — C50512 Malignant neoplasm of lower-outer quadrant of left female breast: Secondary | ICD-10-CM | POA: Diagnosis not present

## 2015-11-14 DIAGNOSIS — Z5111 Encounter for antineoplastic chemotherapy: Secondary | ICD-10-CM

## 2015-11-14 LAB — COMPREHENSIVE METABOLIC PANEL
ALBUMIN: 2.8 g/dL — AB (ref 3.5–5.0)
ALK PHOS: 74 U/L (ref 40–150)
ALT: <9 U/L (ref 0–55)
AST: 17 U/L (ref 5–34)
Anion Gap: 8 mEq/L (ref 3–11)
BILIRUBIN TOTAL: 0.34 mg/dL (ref 0.20–1.20)
BUN: 7.9 mg/dL (ref 7.0–26.0)
CALCIUM: 9.2 mg/dL (ref 8.4–10.4)
CO2: 31 mEq/L — ABNORMAL HIGH (ref 22–29)
Chloride: 105 mEq/L (ref 98–109)
Creatinine: 0.8 mg/dL (ref 0.6–1.1)
EGFR: 83 mL/min/{1.73_m2} — ABNORMAL LOW (ref >90)
GLUCOSE: 101 mg/dL (ref 70–140)
Potassium: 4 mEq/L (ref 3.5–5.1)
Sodium: 143 mEq/L (ref 136–145)
TOTAL PROTEIN: 7.3 g/dL (ref 6.4–8.3)

## 2015-11-14 LAB — CBC WITH DIFFERENTIAL/PLATELET
BASO%: 1.3 % (ref 0.0–2.0)
BASOS ABS: 0.1 10*3/uL (ref 0.0–0.1)
EOS%: 1.9 % (ref 0.0–7.0)
Eosinophils Absolute: 0.1 10*3/uL (ref 0.0–0.5)
HEMATOCRIT: 33.7 % — AB (ref 34.8–46.6)
HGB: 10.7 g/dL — ABNORMAL LOW (ref 11.6–15.9)
LYMPH#: 1.6 10*3/uL (ref 0.9–3.3)
LYMPH%: 23 % (ref 14.0–49.7)
MCH: 25.3 pg (ref 25.1–34.0)
MCHC: 31.6 g/dL (ref 31.5–36.0)
MCV: 80.1 fL (ref 79.5–101.0)
MONO#: 0.6 10*3/uL (ref 0.1–0.9)
MONO%: 8.6 % (ref 0.0–14.0)
NEUT#: 4.5 10*3/uL (ref 1.5–6.5)
NEUT%: 65.2 % (ref 38.4–76.8)
Platelets: 438 10*3/uL — ABNORMAL HIGH (ref 145–400)
RBC: 4.21 10*6/uL (ref 3.70–5.45)
RDW: 16.4 % — ABNORMAL HIGH (ref 11.2–14.5)
WBC: 7 10*3/uL (ref 3.9–10.3)

## 2015-11-14 MED ORDER — FULVESTRANT 250 MG/5ML IM SOLN
500.0000 mg | Freq: Once | INTRAMUSCULAR | Status: AC
  Administered 2015-11-14: 500 mg via INTRAMUSCULAR
  Filled 2015-11-14: qty 10

## 2015-11-15 ENCOUNTER — Ambulatory Visit (INDEPENDENT_AMBULATORY_CARE_PROVIDER_SITE_OTHER): Payer: Medicare Other | Admitting: Internal Medicine

## 2015-11-15 ENCOUNTER — Encounter: Payer: Self-pay | Admitting: Internal Medicine

## 2015-11-15 VITALS — BP 128/72 | HR 81 | Temp 98.5°F | Resp 18 | Wt 169.0 lb

## 2015-11-15 DIAGNOSIS — K047 Periapical abscess without sinus: Secondary | ICD-10-CM

## 2015-11-15 LAB — CANCER ANTIGEN 27.29: CAN 27.29: 268.3 U/mL — AB (ref 0.0–38.6)

## 2015-11-15 MED ORDER — AMOXICILLIN-POT CLAVULANATE 875-125 MG PO TABS: 1.0000 | ORAL_TABLET | Freq: Two times a day (BID) | ORAL | Status: DC

## 2015-11-15 NOTE — Progress Notes (Signed)
Subjective:    Patient ID: Christy Harrell, female    DOB: [birth year], 75 y.o.   MRN: [id number]  HPI She is here for an acute visit for ear pain / cold symptoms.   She had a tooth extracted on Monday.  She was on an antibiotic for the tooth infection 3-4 weeks ago, but was not placed on any additional antibiotics after the tooth was removed.  She has done everything she was supposed to do following the extraction.  She has had pain in the jaw and right side of her face since the tooth was removed.  the pain and increased.    She is haiving headaches, pain on her right side of her jaw and face, her right ear is hurting and she is unsure if she has a sinus infection or the tooth is causing the pain.     She is taking the pain medication as needed.      Medications and allergies reviewed with patient and updated if appropriate.  Patient Active Problem List   Diagnosis Date Noted  . Eczema, dyshidrotic 06/19/2015  . Localized edema 06/19/2015  . 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 Allergy history, radiographic dye 11/21/2014  . Metastases to the liver ([place]) 07/17/2014  . Routine general medical examination at a health care facility 07/10/2014  . Situational anxiety 04/18/2014  . Severe obesity (BMI >= 40) ([place]) 01/15/2014  . Coronary artery calcification seen on CAT scan 12/25/2013  . Atherosclerosis of aorta ([place]) 12/02/2013  . Unspecified constipation 08/21/2013  . Breast cancer of lower-outer quadrant of left female breast ([place]) 05/09/2013  . Venous stasis dermatitis 03/22/2013  . Bone metastasis ([place]) 01/31/2013  . Unspecified vitamin D deficiency 12/23/2012  . Low back pain 12/01/2012  . Lymphedema of upper extremity following lymphadenectomy 02/15/2012  . Spinal stenosis in cervical region 10/28/2011  . Cervical spondylitis with radiculitis ([place]) 10/28/2011  . Hyperlipidemia with target LDL less than 100 09/03/2011  . Type  2 diabetes, uncontrolled, with neuropathy ([place]) 09/03/2011  . Osteoporosis 09/03/2011  . Hypothyroidism 09/03/2011  . Generalized OA 09/03/2011    Current Outpatient Prescriptions on File Prior to Visit  Medication Sig Dispense Refill  . aspirin EC 81 MG tablet Take 1 tablet (81 mg total) by mouth daily.    Marland Kitchen atorvastatin (LIPITOR) 80 MG tablet Take 1 tablet (80 mg total) by mouth daily. 30 tablet 11  . cephALEXin (KEFLEX) 500 MG capsule Take 1 capsule (500 mg total) by mouth 2 (two) times daily. 14 capsule 0  . docusate sodium (COLACE) 100 MG capsule Take 100 mg by mouth 2 (two) times daily as needed for constipation. Reported on 10/17/2015    . doxepin (SINEQUAN) 25 MG capsule Take 2 capsules (50 mg total) by mouth at bedtime as needed. 180 capsule 1  . everolimus (AFINITOR) 10 MG tablet Take 1 tablet (10 mg total) by mouth daily. 28 tablet 2  . exemestane (AROMASIN) 25 MG tablet Take 1 tablet (25 mg total) by mouth daily. 30 tablet 2  . fentaNYL (DURAGESIC - DOSED MCG/HR) 25 MCG/HR patch Place 25 mcg onto the skin every 3 (three) days.    . fluocinonide-emollient (LIDEX-E) 0.05 % cream Apply 1 application topically 2 (two) times daily. 60 g 2  . furosemide (LASIX) 40 MG tablet Take 1 tablet (40 mg total) by mouth daily. 30 tablet 6  . ketoconazole (NIZORAL) 2 % cream Apply 1 application topically daily.  60 g 2  . levothyroxine (SYNTHROID) 175 MCG tablet Take 1 tablet (175 mcg total) by mouth daily before breakfast. 90 tablet 1  . LORazepam (ATIVAN) 0.5 MG tablet Take 15 minutes before MRI test; may repeat x 1 10 tablet 0  . lubiprostone (AMITIZA) 24 MCG capsule Take 1 capsule (24 mcg total) by mouth 2 (two) times daily with a meal. 180 capsule 1  . magic mouthwash SOLN Take 5 mLs by mouth 4 (four) times daily as needed for mouth pain. 240 mL 3  . metFORMIN (GLUCOPHAGE) 500 MG tablet Take 1 tablet (500 mg total) by mouth daily. 90 tablet 1  . metoCLOPramide (REGLAN) 5 MG tablet Take 1 tablet  (5 mg total) by mouth every 8 (eight) hours as needed for nausea. 90 tablet 12  . oxyCODONE (OXY IR/ROXICODONE) 5 MG immediate release tablet Take 1 tablet (5 mg total) by mouth every 4 (four) hours as needed for severe pain. Take 1-2 tablets every 4-6 hours as needed for pain - fill on or after 10/08/15 100 tablet 0  . Polyethyl Glycol-Propyl Glycol (SYSTANE ULTRA OP) Place 1 drop into both eyes daily.    . polyethylene glycol powder (GLYCOLAX/MIRALAX) powder Take 255 g (1 Container total) by mouth daily. 255 g 3  . potassium chloride SA (K-DUR,KLOR-CON) 20 MEQ tablet Take 1 tablet (20 mEq total) by mouth 2 (two) times daily. 180 tablet 1  . pregabalin (LYRICA) 75 MG capsule Take 1 capsule (75 mg total) by mouth 2 (two) times daily. Not taking 60 capsule 5   No current facility-administered medications on file prior to visit.    Past Medical History  Diagnosis Date  . Club foot   . Arthritis   . Thyroid disease   . Hyperlipidemia   . Osteoporosis   . Hypertension   . GERD (gastroesophageal reflux disease)     watches diet  . Neuromuscular disorder (HCC)     lt arm numb sometimes  . Wears glasses   . History of radiation therapy 02/22/13- 03/13/13    left proximal humerus 3500 cGy 14 sessions  . Lymphedema     right arm  . Diabetes mellitus     metformin  . Cancer [facility])     right breast  . Metastasis from malignant tumor of breast ([place])     left humerous  . Metastasis from malignant tumor of breast [facility])     liver    Past Surgical History  Procedure Laterality Date  . Cesarean section    . Thyroidectomy  2002  . Foot surgery      as a child for club foot  . Breast surgery  2012    rt lump-16 nodes-in [place]  . Cesarean section    . Colonoscopy    . Dilation and curettage of uterus    . Humerus im nail Left 12/27/2012    Procedure: INTRAMEDULLARY (IM) NAIL HUMERAL LEFT PATHOLOGIC FRACTURE;  Surgeon: Nita Sells, MD;  Location: [place];  Service:  Orthopedics;  Laterality: Left;    Social History   Social History  . Marital Status: Single    Spouse Name: N/A  . Number of Children: N/A  . Years of Education: N/A   Social History Main Topics  . Smoking status: Former Smoker    Quit date: 12/23/1974  . Smokeless tobacco: Never Used  . Alcohol Use: No  . Drug Use: No  . Sexual Activity: Not Currently   Other Topics  Concern  . Not on file   Social History Narrative    Family History  Problem Relation Age of Onset  . Cancer Neg Hx   . Arthritis Neg Hx   . Heart disease Neg Hx   . Hyperlipidemia Neg Hx   . Hypertension Neg Hx   . Dementia Mother   . Hearing loss Mother     Review of Systems  Constitutional: Negative for fever.  HENT: Positive for ear pain (right ), facial swelling (right lower jaw were tooth was removed), sinus pressure (right sided pain), sore throat (right side) and trouble swallowing. Negative for congestion.   Respiratory: Negative for cough, shortness of breath and wheezing.   Cardiovascular: Negative for chest pain and palpitations.  Gastrointestinal: Negative for nausea and diarrhea.  Neurological: Positive for headaches. Negative for dizziness and light-headedness.       Objective:   Filed Vitals:   11/15/15 1122  BP: 128/72  Pulse: 81  Temp: 98.5 F (36.9 C)  Resp: 18   Filed Weights   11/15/15 1122  Weight: 169 lb (76.658 kg)   Body mass index is 33.01 kg/(m^2).   Physical Exam GENERAL APPEARANCE: Appears stated age, well appearing, NAD EYES: conjunctiva clear, no icterus HEENT: bilateral tympanic membranes and ear canals normal, oropharynx with no erythema, no obvious gum swelling or discharge, lower jaw on right tender to palpation, no thyromegaly, trachea midline, no cervical or supraclavicular lymphadenopathy LUNGS: Clear to auscultation without wheeze or crackles, unlabored breathing, good air entry bilaterally HEART: Normal [id number] 1/6 systolic EXTREMITIES: chornic  edema        Assessment & Plan:   Tooth infection No obvious oral infection on exam, but her pain started after the tooth was extracted and she was not placed on additional antibiotics - most likely the infection is in the gum or bone Will start augmentin -- this is her third antibiotic in a month - start probiotics Pain medication as needed She will call her dentist and follow up  Call with questions or concerns

## 2015-11-15 NOTE — Patient Instructions (Addendum)
Buy a probiotic and start taking daily.  Take for two weeks after you complete the antibiotic.  An antibiotic was sent to your pharmacy.  Complete the entire course.   Call your dentist for follow up.

## 2015-11-21 ENCOUNTER — Telehealth: Payer: Self-pay | Admitting: Internal Medicine

## 2015-11-21 DIAGNOSIS — E1165 Type 2 diabetes mellitus with hyperglycemia: Secondary | ICD-10-CM

## 2015-11-21 DIAGNOSIS — IMO0002 Reserved for concepts with insufficient information to code with codable children: Secondary | ICD-10-CM

## 2015-11-21 DIAGNOSIS — E876 Hypokalemia: Secondary | ICD-10-CM

## 2015-11-21 DIAGNOSIS — E1140 Type 2 diabetes mellitus with diabetic neuropathy, unspecified: Secondary | ICD-10-CM

## 2015-11-21 DIAGNOSIS — R600 Localized edema: Secondary | ICD-10-CM

## 2015-11-21 NOTE — Telephone Encounter (Signed)
Pt called in and said that he gave you a med list and checked all meds she needed refilled on.. Do you have that list by chance ?

## 2015-11-27 MED ORDER — FUROSEMIDE 40 MG PO TABS: 40.0000 mg | ORAL_TABLET | Freq: Every day | ORAL | Status: DC

## 2015-11-27 MED ORDER — POTASSIUM CHLORIDE CRYS ER 20 MEQ PO TBCR: 20.0000 meq | EXTENDED_RELEASE_TABLET | Freq: Two times a day (BID) | ORAL | Status: DC

## 2015-11-27 MED ORDER — METFORMIN HCL 500 MG PO TABS: 500.0000 mg | ORAL_TABLET | Freq: Every day | ORAL | Status: DC

## 2015-11-27 NOTE — Telephone Encounter (Signed)
According to her fill history it doenst look she has anything due. She may be out of fills but will send scripts to be on hold

## 2015-11-29 ENCOUNTER — Encounter: Payer: Self-pay | Admitting: Podiatry

## 2015-11-29 ENCOUNTER — Ambulatory Visit (INDEPENDENT_AMBULATORY_CARE_PROVIDER_SITE_OTHER): Payer: Medicare Other | Admitting: Podiatry

## 2015-11-29 DIAGNOSIS — E1149 Type 2 diabetes mellitus with other diabetic neurological complication: Secondary | ICD-10-CM

## 2015-11-29 DIAGNOSIS — M79676 Pain in unspecified toe(s): Secondary | ICD-10-CM

## 2015-11-29 DIAGNOSIS — B351 Tinea unguium: Secondary | ICD-10-CM | POA: Diagnosis not present

## 2015-11-29 MED ORDER — CLOTRIMAZOLE-BETAMETHASONE 1-0.05 % EX CREA: 1.0000 "application " | TOPICAL_CREAM | Freq: Two times a day (BID) | CUTANEOUS | Status: DC

## 2015-11-30 NOTE — Progress Notes (Signed)
Patient ID: Christy Harrell, female   DOB: 1941-06-30, 75 y.o.   MRN: [id number]  Subjective: 75 y.o.-year-old female returns the office today for painful, elongated, thickened toenails which she is unable to trim herself. Denies any redness or drainage around the nails or calluses. Denies any acute changes since last appointment and no new complaints today. Denies any systemic complaints such as fevers, chills, nausea, vomiting.   Objective: AAO 3, NAD DP/PT pulses palpable, CRT less than 3 seconds Chronic appearing edema to bilateral lower extremities. Protective sensation intact with Derrel Nip monofilament Nails hypertrophic, dystrophic, elongated, brittle, discolored 10. There is tenderness overlying the nails 1-5 bilaterally. There is no surrounding erythema or drainage along the nail sites. Hyperkeratotic lesion right submetatarsal 3/4 and bilateral heels. Upon debridement no underlying ulceration, drainage or other signs of infection. No open lesions or other pre-ulcerative lesions are identified. No other areas of tenderness bilateral lower extremities. No overlying edema, erythema, increased warmth. No pain with calf compression, swelling, warmth, erythema.  Assessment: Patient presents with symptomatic onychomycosis; hyperkeratotic lesions  Plan: -Treatment options including alternatives, risks, complications were discussed -Nails sharply debrided 10 without complication/bleeding. -Hyperkeratotic lesions sharply debrided without complications/bleeding. -Discussed daily foot inspection. If there are any changes, to call the office immediately.  -Follow-up in 3 months or sooner if any problems are to arise. In the meantime, encouraged to call the office with any questions, concerns, changes symptoms.  Celesta Gentile, DPM

## 2015-12-03 ENCOUNTER — Telehealth: Payer: Self-pay | Admitting: *Deleted

## 2015-12-03 ENCOUNTER — Other Ambulatory Visit (INDEPENDENT_AMBULATORY_CARE_PROVIDER_SITE_OTHER): Payer: Medicare Other

## 2015-12-03 ENCOUNTER — Ambulatory Visit (INDEPENDENT_AMBULATORY_CARE_PROVIDER_SITE_OTHER)
Admission: RE | Admit: 2015-12-03 | Discharge: 2015-12-03 | Disposition: A | Discharge status: Home / Self Care | Payer: Medicare Other | Source: Ambulatory Visit | Attending: Internal Medicine | Admitting: Internal Medicine

## 2015-12-03 ENCOUNTER — Encounter: Payer: Self-pay | Admitting: Internal Medicine

## 2015-12-03 ENCOUNTER — Ambulatory Visit (INDEPENDENT_AMBULATORY_CARE_PROVIDER_SITE_OTHER): Payer: Medicare Other | Admitting: Internal Medicine

## 2015-12-03 VITALS — BP 120/70 | HR 75 | Temp 98.5°F | Resp 16 | Ht 60.0 in | Wt 171.0 lb

## 2015-12-03 DIAGNOSIS — E1165 Type 2 diabetes mellitus with hyperglycemia: Secondary | ICD-10-CM

## 2015-12-03 DIAGNOSIS — E038 Other specified hypothyroidism: Secondary | ICD-10-CM

## 2015-12-03 DIAGNOSIS — E1140 Type 2 diabetes mellitus with diabetic neuropathy, unspecified: Secondary | ICD-10-CM

## 2015-12-03 DIAGNOSIS — R51 Headache: Secondary | ICD-10-CM

## 2015-12-03 DIAGNOSIS — K5901 Slow transit constipation: Secondary | ICD-10-CM

## 2015-12-03 DIAGNOSIS — M5412 Radiculopathy, cervical region: Secondary | ICD-10-CM

## 2015-12-03 DIAGNOSIS — M4722 Other spondylosis with radiculopathy, cervical region: Secondary | ICD-10-CM | POA: Diagnosis not present

## 2015-12-03 DIAGNOSIS — J0100 Acute maxillary sinusitis, unspecified: Secondary | ICD-10-CM | POA: Insufficient documentation

## 2015-12-03 DIAGNOSIS — M4802 Spinal stenosis, cervical region: Secondary | ICD-10-CM

## 2015-12-03 DIAGNOSIS — M4692 Unspecified inflammatory spondylopathy, cervical region: Secondary | ICD-10-CM

## 2015-12-03 DIAGNOSIS — R519 Headache, unspecified: Secondary | ICD-10-CM

## 2015-12-03 DIAGNOSIS — G893 Neoplasm related pain (acute) (chronic): Secondary | ICD-10-CM | POA: Diagnosis not present

## 2015-12-03 DIAGNOSIS — IMO0002 Reserved for concepts with insufficient information to code with codable children: Secondary | ICD-10-CM

## 2015-12-03 DIAGNOSIS — M4682 Other specified inflammatory spondylopathies, cervical region: Secondary | ICD-10-CM

## 2015-12-03 LAB — SEDIMENTATION RATE: Sed Rate: 24 mm/hr — ABNORMAL HIGH (ref 0–22)

## 2015-12-03 LAB — CBC WITH DIFFERENTIAL/PLATELET
BASOS PCT: 0.8 % (ref 0.0–3.0)
Basophils Absolute: 0 10*3/uL (ref 0.0–0.1)
EOS PCT: 2.1 % (ref 0.0–5.0)
Eosinophils Absolute: 0.1 10*3/uL (ref 0.0–0.7)
HCT: 35 % — ABNORMAL LOW (ref 36.0–46.0)
HEMOGLOBIN: 11.5 g/dL — AB (ref 12.0–15.0)
LYMPHS ABS: 1.9 10*3/uL (ref 0.7–4.0)
Lymphocytes Relative: 35 % (ref 12.0–46.0)
MCHC: 32.9 g/dL (ref 30.0–36.0)
MCV: 79.7 fl (ref 78.0–100.0)
MONO ABS: 0.5 10*3/uL (ref 0.1–1.0)
MONOS PCT: 9 % (ref 3.0–12.0)
NEUTROS PCT: 53.1 % (ref 43.0–77.0)
Neutro Abs: 2.9 10*3/uL (ref 1.4–7.7)
Platelets: 316 10*3/uL (ref 150.0–400.0)
RBC: 4.39 Mil/uL (ref 3.87–5.11)
RDW: 18.5 % — ABNORMAL HIGH (ref 11.5–15.5)
WBC: 5.4 10*3/uL (ref 4.0–10.5)

## 2015-12-03 LAB — COMPREHENSIVE METABOLIC PANEL
ALK PHOS: 77 U/L (ref 39–117)
ALT: 7 U/L (ref 0–35)
AST: 19 U/L (ref 0–37)
Albumin: 3.6 g/dL (ref 3.5–5.2)
BUN: 10 mg/dL (ref 6–23)
CHLORIDE: 106 meq/L (ref 96–112)
CO2: 28 mEq/L (ref 19–32)
Calcium: 9.1 mg/dL (ref 8.4–10.5)
Creatinine, Ser: 0.71 mg/dL (ref 0.40–1.20)
GFR: 103.4 mL/min (ref >60.00)
GLUCOSE: 122 mg/dL — AB (ref 70–99)
POTASSIUM: 4.4 meq/L (ref 3.5–5.1)
SODIUM: 142 meq/L (ref 135–145)
TOTAL PROTEIN: 7.4 g/dL (ref 6.0–8.3)
Total Bilirubin: 0.5 mg/dL (ref 0.2–1.2)

## 2015-12-03 LAB — TSH: TSH: 2.91 u[IU]/mL (ref 0.35–4.50)

## 2015-12-03 LAB — HEMOGLOBIN A1C: HEMOGLOBIN A1C: 6.4 % (ref 4.6–6.5)

## 2015-12-03 MED ORDER — LUBIPROSTONE 24 MCG PO CAPS: 24.0000 ug | ORAL_CAPSULE | Freq: Two times a day (BID) | ORAL | Status: AC

## 2015-12-03 MED ORDER — FENTANYL 25 MCG/HR TD PT72: 25.0000 ug | MEDICATED_PATCH | TRANSDERMAL | Status: DC

## 2015-12-03 MED ORDER — DOCUSATE SODIUM 100 MG PO CAPS: 100.0000 mg | ORAL_CAPSULE | Freq: Two times a day (BID) | ORAL | Status: AC | PRN

## 2015-12-03 MED ORDER — AMOXICILLIN-POT CLAVULANATE 875-125 MG PO TABS: 1.0000 | ORAL_TABLET | Freq: Two times a day (BID) | ORAL | Status: AC

## 2015-12-03 MED ORDER — OXYCODONE HCL 5 MG PO TABS: 5.0000 mg | ORAL_TABLET | ORAL | Status: DC | PRN

## 2015-12-03 MED ORDER — PREGABALIN 75 MG PO CAPS: 75.0000 mg | ORAL_CAPSULE | Freq: Two times a day (BID) | ORAL | Status: DC

## 2015-12-03 NOTE — Patient Instructions (Signed)

## 2015-12-03 NOTE — Progress Notes (Signed)
Pre visit review using our clinic review tool, if applicable. No additional management support is needed unless otherwise documented below in the visit note. 

## 2015-12-03 NOTE — Telephone Encounter (Signed)
Pt in office today states this list was given to Geni Bers she saw Dr burns and was pertaining to that visit. Fills were given this office visit today per pt and PCP agrreement

## 2015-12-03 NOTE — Telephone Encounter (Signed)
This RN returned call to pt per her concerns regarding " mouth soreness". Christy Harrell states she does not have mouth sores but " my mouth is sore after having my tooth pulled ".  Pt had dental extraction on lower left side approximately 2 weeks ago. She was seen last week by dentist " who said it was healing well - but now my ears, head and throat are hurting too " " it's like I need to sneeze but cannot get a good sneeze ".  Pt has contacted her primary MD - Dr [name] - and has an appointment today for 1pm. [name] states she is also " out of all my pain medications "-  Per above pt will come to this office for refills post above MD appointment if not obtained under Dr Ronnald Ramp.

## 2015-12-03 NOTE — Progress Notes (Signed)
Subjective:  Patient ID: Christy Harrell, female    DOB: 1941-07-19  Age: 75 y.o. MRN: [id number]  CC: Hypothyroidism; Diabetes; and Sinusitis   HPI Christy Harrell presents for Follow-up on multiple medical problems but she has new complaints today. She has been seeing a dentist over the last few weeks for a complicated tooth In her right lower mouth. She tells me the tooth had to be a extracted. She went back to see the dentist about 5 days ago for some right facial pain and the dentist told her the x-ray of her teeth was normal. She has  right facial pain that radiates into her right ear. She has not noticed any facial swelling. She tells me the floor of her mouth is not elevated and she is not having any trouble swallowing. She denies fever, chills, sore throat, night sweats, or cough.  Outpatient Prescriptions Prior to Visit  Medication Sig Dispense Refill  . aspirin EC 81 MG tablet Take 1 tablet (81 mg total) by mouth daily.    Marland Kitchen atorvastatin (LIPITOR) 80 MG tablet Take 1 tablet (80 mg total) by mouth daily. 30 tablet 11  . clotrimazole-betamethasone (LOTRISONE) cream Apply 1 application topically 2 (two) times daily. 30 g 0  . doxepin (SINEQUAN) 25 MG capsule Take 2 capsules (50 mg total) by mouth at bedtime as needed. 180 capsule 1  . everolimus (AFINITOR) 10 MG tablet Take 1 tablet (10 mg total) by mouth daily. 28 tablet 2  . exemestane (AROMASIN) 25 MG tablet Take 1 tablet (25 mg total) by mouth daily. 30 tablet 2  . fluocinonide-emollient (LIDEX-E) 0.05 % cream Apply 1 application topically 2 (two) times daily. 60 g 2  . furosemide (LASIX) 40 MG tablet Take 1 tablet (40 mg total) by mouth daily. 90 tablet 2  . ketoconazole (NIZORAL) 2 % cream Apply 1 application topically daily. 60 g 2  . levothyroxine (SYNTHROID) 175 MCG tablet Take 1 tablet (175 mcg total) by mouth daily before breakfast. 90 tablet 1  . LORazepam (ATIVAN) 0.5 MG tablet Take 15 minutes before MRI test; may  repeat x 1 10 tablet 0  . magic mouthwash SOLN Take 5 mLs by mouth 4 (four) times daily as needed for mouth pain. 240 mL 3  . metFORMIN (GLUCOPHAGE) 500 MG tablet Take 1 tablet (500 mg total) by mouth daily. 90 tablet 1  . metoCLOPramide (REGLAN) 5 MG tablet Take 1 tablet (5 mg total) by mouth every 8 (eight) hours as needed for nausea. 90 tablet 12  . Polyethyl Glycol-Propyl Glycol (SYSTANE ULTRA OP) Place 1 drop into both eyes daily.    . polyethylene glycol powder (GLYCOLAX/MIRALAX) powder Take 255 g (1 Container total) by mouth daily. 255 g 3  . potassium chloride SA (K-DUR,KLOR-CON) 20 MEQ tablet Take 1 tablet (20 mEq total) by mouth 2 (two) times daily. 180 tablet 1  . docusate sodium (COLACE) 100 MG capsule Take 100 mg by mouth 2 (two) times daily as needed for constipation. Reported on 10/17/2015    . fentaNYL (DURAGESIC - DOSED MCG/HR) 25 MCG/HR patch Place 25 mcg onto the skin every 3 (three) days.    Marland Kitchen lubiprostone (AMITIZA) 24 MCG capsule Take 1 capsule (24 mcg total) by mouth 2 (two) times daily with a meal. 180 capsule 1  . oxyCODONE (OXY IR/ROXICODONE) 5 MG immediate release tablet Take 1 tablet (5 mg total) by mouth every 4 (four) hours as needed for severe pain. Take 1-2 tablets every 4-6 hours  as needed for pain - fill on or after 10/08/15 100 tablet 0  . pregabalin (LYRICA) 75 MG capsule Take 1 capsule (75 mg total) by mouth 2 (two) times daily. Not taking 60 capsule 5  . amoxicillin-clavulanate (AUGMENTIN) 875-125 MG tablet Take 1 tablet by mouth 2 (two) times daily. 20 tablet 0   No facility-administered medications prior to visit.    ROS Review of Systems  Constitutional: Negative.  Negative for fever, chills, diaphoresis, appetite change and fatigue.  HENT: Positive for dental problem and sinus pressure. Negative for drooling, facial swelling, hearing loss, sore throat, trouble swallowing and voice change.   Eyes: Negative.   Respiratory: Negative.  Negative for cough,  choking, chest tightness, shortness of breath and stridor.   Cardiovascular: Negative.  Negative for chest pain, palpitations and leg swelling.  Gastrointestinal: Positive for constipation. Negative for nausea, vomiting, abdominal pain and diarrhea.  Endocrine: Negative.   Genitourinary: Negative.   Musculoskeletal: Positive for arthralgias and neck pain. Negative for myalgias, back pain, joint swelling and neck stiffness.  Skin: Negative.  Negative for color change and rash.  Allergic/Immunologic: Negative.   Neurological: Negative.  Negative for dizziness, tremors, syncope, numbness and headaches.  Hematological: Negative.  Negative for adenopathy. Does not bruise/bleed easily.  Psychiatric/Behavioral: Negative.     Objective:  BP 120/70 mmHg  Pulse 75  Temp(Src) 98.5 F (36.9 C) (Oral)  Resp 16  Ht 5' (1.524 m)  Wt 171 lb (77.565 kg)  BMI 33.40 kg/m2  SpO2 98%  BP Readings from Last 3 Encounters:  12/03/15 120/70  11/15/15 128/72  11/14/15 132/46    Wt Readings from Last 3 Encounters:  12/03/15 171 lb (77.565 kg)  11/15/15 169 lb (76.658 kg)  10/17/15 175 lb 11.2 oz (79.697 kg)    Physical Exam  Constitutional: She is oriented to person, place, and time. No distress.  HENT:  Right Ear: Hearing, tympanic membrane, external ear and ear canal normal.  Left Ear: Hearing, tympanic membrane, external ear and ear canal normal.  Nose: No mucosal edema, rhinorrhea, sinus tenderness or nasal septal hematoma. No epistaxis. Right sinus exhibits maxillary sinus tenderness. Right sinus exhibits no frontal sinus tenderness. Left sinus exhibits no maxillary sinus tenderness and no frontal sinus tenderness.  Mouth/Throat: Oropharynx is clear and moist and mucous membranes are normal. Mucous membranes are not pale and not cyanotic. No oral lesions. No trismus in the jaw. No dental abscesses, uvula swelling or lacerations. No oropharyngeal exudate.  There is no tenderness to palpation or  swelling along the gumline or the floor of her mouth.  Eyes: Conjunctivae are normal. Right eye exhibits no discharge. Left eye exhibits no discharge. No scleral icterus.  Neck: Normal range of motion. Neck supple. No JVD present. No tracheal deviation present. No thyromegaly present.  Cardiovascular: Normal rate, regular rhythm, normal heart sounds and intact distal pulses.  Exam reveals no gallop and no friction rub.   No murmur heard. Pulmonary/Chest: Effort normal and breath sounds normal. No stridor. No respiratory distress. She has no wheezes. She has no rales. She exhibits no tenderness.  Abdominal: Soft. Bowel sounds are normal. She exhibits no distension and no mass. There is no tenderness. There is no rebound and no guarding.  Musculoskeletal: Normal range of motion. She exhibits no edema or tenderness.  Lymphadenopathy:    She has no cervical adenopathy.  Neurological: She is oriented to person, place, and time.  Skin: Skin is warm and dry. No rash noted. She is  not diaphoretic. No erythema. No pallor.  Vitals reviewed.   Lab Results  Component Value Date   WBC 5.4 12/03/2015   HGB 11.5* 12/03/2015   HCT 35.0* 12/03/2015   PLT 316.0 12/03/2015   GLUCOSE 122* 12/03/2015   CHOL 272* 02/20/2015   TRIG 95.0 02/20/2015   HDL 58.70 02/20/2015   LDLDIRECT 160.0 03/16/2013   LDLCALC 194* 02/20/2015   ALT 7 12/03/2015   AST 19 12/03/2015   NA 142 12/03/2015   K 4.4 12/03/2015   CL 106 12/03/2015   CREATININE 0.71 12/03/2015   BUN 10 12/03/2015   CO2 28 12/03/2015   TSH 2.91 12/03/2015   HGBA1C 6.4 12/03/2015   MICROALBUR 7.3* 10/19/2014    Mr Brain Wo Contrast  10/04/2015  CLINICAL DATA:  New headaches and nosebleeds for 3-4 weeks. History of left lower outer quadrant breast cancer. EXAM: MRI HEAD WITHOUT CONTRAST TECHNIQUE: Multiplanar, multiecho pulse sequences of the brain and surrounding structures were obtained without intravenous contrast. COMPARISON:  None. FINDINGS:  The study was performed without IV contrast at the request of the ordering physician due to patient's history of prior adverse reaction to gadolinium. Sensitivity for detection of intracranial metastases is limited by this. There is a punctate, 1.5 mm focus of increase trace diffusion signal in the right corona radiata adjacent to the body of the caudate with suggestion of corresponding reduced ADC (series 6, image 69). There is no evidence of acute infarct elsewhere. There is no evidence of intracranial hemorrhage, mass, midline shift, or extra-axial fluid collection. Ventricles and sulci are normal for age. Small foci of T2 hyperintensity in the cerebral white matter bilaterally are nonspecific but compatible with mild chronic small vessel ischemic disease. Orbits are unremarkable. Mild bilateral maxillary, bilateral ethmoid, and right sphenoid sinus mucosal thickening is noted. Mastoid air cells are clear. Major intracranial vascular flow voids are preserved. A joint effusion is noted at the right C1-2 articulation. IMPRESSION: 1. Possible punctate acute small vessel infarct in the right corona radiata. 2. No evidence of intracranial metastatic disease on this unenhanced study. 3. Mild chronic small vessel ischemic disease. Electronically Signed   By: Logan Bores M.D.   On: 10/04/2015 11:51    Assessment & Plan:   Tuwanna was seen today for hypothyroidism, diabetes and sinusitis.  Diagnoses and all orders for this visit:  Slow transit constipation- she has had a good response to Amitiza, will continue -     lubiprostone (AMITIZA) 24 MCG capsule; Take 1 capsule (24 mcg total) by mouth 2 (two) times daily with a meal. -     Comprehensive metabolic panel; Future  Cancer associated pain -     fentaNYL (DURAGESIC - DOSED MCG/HR) 25 MCG/HR patch; Place 1 patch (25 mcg total) onto the skin every 3 (three) days. -     oxyCODONE (OXY IR/ROXICODONE) 5 MG immediate release tablet; Take 1 tablet (5 mg  total) by mouth every 4 (four) hours as needed for severe pain. Take 1-2 tablets every 4-6 hours as needed for pain - fill on or after 12/03/15 -     pregabalin (LYRICA) 75 MG capsule; Take 1 capsule (75 mg total) by mouth 2 (two) times daily. Not taking  Cervical spondylitis with radiculitis (HCC) -     fentaNYL (DURAGESIC - DOSED MCG/HR) 25 MCG/HR patch; Place 1 patch (25 mcg total) onto the skin every 3 (three) days. -     oxyCODONE (OXY IR/ROXICODONE) 5 MG immediate release tablet; Take 1 tablet (  5 mg total) by mouth every 4 (four) hours as needed for severe pain. Take 1-2 tablets every 4-6 hours as needed for pain - fill on or after 12/03/15  Type 2 diabetes, uncontrolled, with neuropathy ([place])- her A1c is 7.3%, her blood sugars are adequately well controlled, will continue metformin -     pregabalin (LYRICA) 75 MG capsule; Take 1 capsule (75 mg total) by mouth 2 (two) times daily. Not taking -     Comprehensive metabolic panel; Future -     Hemoglobin A1c; Future  Other specified hypothyroidism- her TSH is in the normal range, she will continue the current dose of Synthroid. -     CBC with Differential/Platelet; Future -     TSH; Future  Right facial pain- I do not see any complications related to her recent dental procedure, x-rays of her sinuses show some inflammation so will treat her for sinusitis. -     DG Sinuses Complete; Future -     Sedimentation rate; Future  Spinal stenosis in cervical region -     fentaNYL (DURAGESIC - DOSED MCG/HR) 25 MCG/HR patch; Place 1 patch (25 mcg total) onto the skin every 3 (three) days.  Subacute maxillary sinusitis -     amoxicillin-clavulanate (AUGMENTIN) 875-125 MG tablet; Take 1 tablet by mouth 2 (two) times daily.  Other orders -     docusate sodium (COLACE) 100 MG capsule; Take 1 capsule (100 mg total) by mouth 2 (two) times daily as needed. Reported on 10/17/2015   I have discontinued Ms. Nipp's amoxicillin-clavulanate. I have also  changed her fentaNYL, oxyCODONE, and docusate sodium. Additionally, I am having her start on amoxicillin-clavulanate. Lastly, I am having her maintain her Polyethyl Glycol-Propyl Glycol (SYSTANE ULTRA OP), aspirin EC, polyethylene glycol powder, LORazepam, atorvastatin, ketoconazole, doxepin, levothyroxine, fluocinonide-emollient, exemestane, metoCLOPramide, everolimus, magic mouthwash, furosemide, metFORMIN, potassium chloride SA, clotrimazole-betamethasone, lubiprostone, and pregabalin.  Meds ordered this encounter  Medications  . fentaNYL (DURAGESIC - DOSED MCG/HR) 25 MCG/HR patch    Sig: Place 1 patch (25 mcg total) onto the skin every 3 (three) days.    Dispense:  5 patch    Refill:  0  . lubiprostone (AMITIZA) 24 MCG capsule    Sig: Take 1 capsule (24 mcg total) by mouth 2 (two) times daily with a meal.    Dispense:  180 capsule    Refill:  1  . oxyCODONE (OXY IR/ROXICODONE) 5 MG immediate release tablet    Sig: Take 1 tablet (5 mg total) by mouth every 4 (four) hours as needed for severe pain. Take 1-2 tablets every 4-6 hours as needed for pain - fill on or after 12/03/15    Dispense:  100 tablet    Refill:  0  . pregabalin (LYRICA) 75 MG capsule    Sig: Take 1 capsule (75 mg total) by mouth 2 (two) times daily. Not taking    Dispense:  60 capsule    Refill:  5  . docusate sodium (COLACE) 100 MG capsule    Sig: Take 1 capsule (100 mg total) by mouth 2 (two) times daily as needed. Reported on 10/17/2015    Dispense:  10 capsule    Refill:  0  . amoxicillin-clavulanate (AUGMENTIN) 875-125 MG tablet    Sig: Take 1 tablet by mouth 2 (two) times daily.    Dispense:  28 tablet    Refill:  1     Follow-up: Return in about 3 weeks (around 12/24/2015).  [name], MD

## 2015-12-04 ENCOUNTER — Ambulatory Visit (HOSPITAL_COMMUNITY)
Admission: RE | Admit: 2015-12-04 | Discharge: 2015-12-04 | Disposition: A | Discharge status: Home / Self Care | Payer: Medicare Other | Source: Ambulatory Visit | Attending: Nurse Practitioner | Admitting: Nurse Practitioner

## 2015-12-04 DIAGNOSIS — C50512 Malignant neoplasm of lower-outer quadrant of left female breast: Secondary | ICD-10-CM | POA: Diagnosis not present

## 2015-12-04 DIAGNOSIS — C787 Secondary malignant neoplasm of liver and intrahepatic bile duct: Secondary | ICD-10-CM | POA: Diagnosis not present

## 2015-12-04 DIAGNOSIS — Z9889 Other specified postprocedural states: Secondary | ICD-10-CM | POA: Insufficient documentation

## 2015-12-05 ENCOUNTER — Telehealth: Payer: Self-pay | Admitting: Internal Medicine

## 2015-12-05 NOTE — Telephone Encounter (Signed)
Pt called back Tiffany informed her of her results

## 2015-12-05 NOTE — Telephone Encounter (Signed)
Relation to [id number] Call back number: [phone]   Reason for call:  Patient inquiring about lab and U/S results, please advise

## 2015-12-09 ENCOUNTER — Telehealth: Payer: Self-pay

## 2015-12-09 NOTE — Telephone Encounter (Signed)
APPROVED through 08/09/2016 

## 2015-12-09 NOTE — Telephone Encounter (Signed)
PA initiated via CoverMyMeds key [id number]

## 2015-12-11 ENCOUNTER — Ambulatory Visit (HOSPITAL_BASED_OUTPATIENT_CLINIC_OR_DEPARTMENT_OTHER): Payer: Medicare Other | Admitting: Oncology

## 2015-12-11 ENCOUNTER — Other Ambulatory Visit (HOSPITAL_BASED_OUTPATIENT_CLINIC_OR_DEPARTMENT_OTHER): Payer: Medicare Other

## 2015-12-11 ENCOUNTER — Ambulatory Visit: Payer: Medicare Other

## 2015-12-11 ENCOUNTER — Telehealth: Payer: Self-pay | Admitting: Oncology

## 2015-12-11 VITALS — BP 132/66 | HR 83 | Temp 98.0°F | Resp 18 | Ht 60.0 in | Wt 169.4 lb

## 2015-12-11 DIAGNOSIS — C7951 Secondary malignant neoplasm of bone: Secondary | ICD-10-CM | POA: Diagnosis not present

## 2015-12-11 DIAGNOSIS — C787 Secondary malignant neoplasm of liver and intrahepatic bile duct: Secondary | ICD-10-CM | POA: Diagnosis not present

## 2015-12-11 DIAGNOSIS — C50512 Malignant neoplasm of lower-outer quadrant of left female breast: Secondary | ICD-10-CM | POA: Diagnosis not present

## 2015-12-11 LAB — COMPREHENSIVE METABOLIC PANEL
ALBUMIN: 2.9 g/dL — AB (ref 3.5–5.0)
ALK PHOS: 73 U/L (ref 40–150)
ALT: 10 U/L (ref 0–55)
AST: 21 U/L (ref 5–34)
Anion Gap: 8 mEq/L (ref 3–11)
BUN: 9.6 mg/dL (ref 7.0–26.0)
CALCIUM: 9.6 mg/dL (ref 8.4–10.4)
CO2: 30 mEq/L — ABNORMAL HIGH (ref 22–29)
Chloride: 104 mEq/L (ref 98–109)
Creatinine: 0.8 mg/dL (ref 0.6–1.1)
EGFR: 82 mL/min/{1.73_m2} — AB (ref >90)
Glucose: 104 mg/dl (ref 70–140)
POTASSIUM: 3.8 meq/L (ref 3.5–5.1)
Sodium: 142 mEq/L (ref 136–145)
TOTAL PROTEIN: 7.2 g/dL (ref 6.4–8.3)
Total Bilirubin: 0.36 mg/dL (ref 0.20–1.20)

## 2015-12-11 LAB — CBC WITH DIFFERENTIAL/PLATELET
BASO%: 0.2 % (ref 0.0–2.0)
BASOS ABS: 0 10*3/uL (ref 0.0–0.1)
EOS ABS: 0.1 10*3/uL (ref 0.0–0.5)
EOS%: 1.9 % (ref 0.0–7.0)
HEMATOCRIT: 34.8 % (ref 34.8–46.6)
HEMOGLOBIN: 11 g/dL — AB (ref 11.6–15.9)
LYMPH#: 1.6 10*3/uL (ref 0.9–3.3)
LYMPH%: 33.9 % (ref 14.0–49.7)
MCH: 26.3 pg (ref 25.1–34.0)
MCHC: 31.6 g/dL (ref 31.5–36.0)
MCV: 83.1 fL (ref 79.5–101.0)
MONO#: 0.4 10*3/uL (ref 0.1–0.9)
MONO%: 7.8 % (ref 0.0–14.0)
NEUT#: 2.7 10*3/uL (ref 1.5–6.5)
NEUT%: 56.2 % (ref 38.4–76.8)
Platelets: 218 10*3/uL (ref 145–400)
RBC: 4.19 10*6/uL (ref 3.70–5.45)
RDW: 16.7 % — AB (ref 11.2–14.5)
WBC: 4.7 10*3/uL (ref 3.9–10.3)

## 2015-12-11 MED ORDER — MAGIC MOUTHWASH: 5.0000 mL | Freq: Four times a day (QID) | ORAL | Status: DC | PRN

## 2015-12-11 MED ORDER — ONDANSETRON HCL 8 MG PO TABS: 8.0000 mg | ORAL_TABLET | Freq: Two times a day (BID) | ORAL | Status: DC | PRN

## 2015-12-11 MED ORDER — PROCHLORPERAZINE MALEATE 10 MG PO TABS: ORAL_TABLET | ORAL | Status: DC

## 2015-12-11 MED ORDER — LIDOCAINE-PRILOCAINE 2.5-2.5 % EX CREA: TOPICAL_CREAM | CUTANEOUS | Status: DC

## 2015-12-11 MED ORDER — ONDANSETRON HCL 8 MG PO TABS: ORAL_TABLET | ORAL | Status: DC

## 2015-12-11 NOTE — Telephone Encounter (Signed)
appt made and avs printed °

## 2015-12-11 NOTE — Progress Notes (Signed)
ID: Christy Harrell   DOB: June 08, 1941  MR#: [id number]  WUJ#:[id number]  PCP: Christy Calico, MD GYN:  SU:  OTHER MD:  Christy Harrell, Christy Harrell, Christy Harrell  CC: Breast cancer stage IV  TREATMENT: exemestane, everolimus, fulvestrant, and denosumab  BREAST CANCER HISTORY: From the original intake note:  The patient had routine screening mammography 10/16/2010 showing a lobulated mass in the right subareolar region measuring up to 5.5 cm. The Left breast showed some central microcalcifications. Biopsy of the Right breast mass on 10/28/2010 showed an invasive ductal carcinoma, grade 2, estrogen receptor positive (Allred score 8), progesterone receptor positive (Allred score 5), with an equivocal HER-2. The Left breast area of microcalcifications was biopsied at the same time, and was read as suspicious for DCIS.  On 11/17/2010 the patient underwent Right lumpectomy and axillary lymph node dissection for what proved to be a 3 cm invasive ductal carcinoma, grade 2, with some papillary and mucinous features. One of 16 lymph nodes was involved. FISH showed no HER-2 amplification. Left breast biopsy was benign.  The patient had an Oncotype sent, with a score of 27, predicting a risk of distant recurrence after 5 years of tamoxifen in the 18% range. With this intermediate result, the decision was made not to proceed with chemotherapy, since the patient is the sole caregiver to her very elderly mother. Instead the patient proceeded to radiation treatment which was completed 03/06/2011. She started anastrozole at that point. Her subsequent history is as detailed below   INTERVAL HISTORY: Christy Harrell returns today for follow up of her metastatic breast cancer. She continues on exemestane and everolimus and tolerates both drugs well with few complaints. She has some hot flashes, but denies vaginal dryness, unusual arthralgias/myalgias. She has no mouth sores, rashes,  diarrhea, or pneumonitis. She is also due for her next dose of fulvestrant today. The timing of denosumab has been moved out to every 12 weeks.  REVIEW OF SYSTEMS: Christy Harrell has recent stopped using her fentanyl patch, despite it controlling her pain well. She developed a rash to her arms and legs and thought it was from this. Dr. Jenny Harrell prescribed her lidex cream and told her to continue the OTC hydrocortisone cream she had been using. This rash has faded, and now she just has dry skin. She is using dove and aveeno products to combat this. She has a new rash to her right cheek and chin that is different. It does not hurt but its fairly pruritic as well. She is still not using the fentanyl patches as a result. She has increased her use of oxycodone. On top of her usual pain, her right mouth is sore where there are no longer teeth. She plans to follow up with a dentist. Otherwise Christy Harrell is stable. Her nausea is better managed since Dr. Jana Harrell prescribed her reglan. Amitiza has her moving her bowels well. She denies shortness of breath, chest pain, cough, or palpitations. She has occasional mild headaches. A detailed review of systems is otherwise stable.  PAST MEDICAL HISTORY: Past Medical History  Diagnosis Date  . Club foot   . Arthritis   . Thyroid disease   . Hyperlipidemia   . Osteoporosis   . Hypertension   . GERD (gastroesophageal reflux disease)     watches diet  . Neuromuscular disorder (HCC)     lt arm numb sometimes  . Wears glasses   . History of radiation therapy 02/22/13- 03/13/13    left proximal humerus 3500  cGy 14 sessions  . Lymphedema     right arm  . Diabetes mellitus     metformin  . Cancer [facility])     right breast  . Metastasis from malignant tumor of breast ([facility])     left humerous  . Metastasis from malignant tumor of breast [facility])     liver    PAST SURGICAL HISTORY: Past Surgical History  Procedure Laterality Date  . Cesarean section    . Thyroidectomy   2002  . Foot surgery      as a child for club foot  . Breast surgery  2012    rt lump-16 nodes-in [place]  . Cesarean section    . Colonoscopy    . Dilation and curettage of uterus    . Humerus im nail Left 12/27/2012    Procedure: INTRAMEDULLARY (IM) NAIL HUMERAL LEFT PATHOLOGIC FRACTURE;  Surgeon: Christy Sells, MD;  Location: [facility];  Service: Orthopedics;  Laterality: Left;    FAMILY HISTORY Family History  Problem Relation Age of Onset  . Cancer Neg Hx   . Arthritis Neg Hx   . Heart disease Neg Hx   . Hyperlipidemia Neg Hx   . Hypertension Neg Hx   . Dementia Mother   . Hearing loss Mother    The patient's father died from unknown causes. The patient's mother is alive at age 32. The patient was a single child. There is no history of breast or ovarian cancer in the family to her knowledge.  GYNECOLOGIC HISTORY: Menarche age 36, menopause in her early 92s. The patient is GX P1, first pregnancy to term age 4. She did not take hormone replacement  SOCIAL HISTORY: The patient grew up in [place], attended [employer] high school, then moved to [place] where she lived about 40 years. 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 Her mother, Christy Harrell away at 100, on Sep 11, 2014. The patient's daughter Christy Harrell, 20, is disabled secondary to pulmonary hypertension. She can be reached at [phone]. The patient has 2 grandchildren, Christy Harrell who works at a Scientist, physiological business and is 75 years old, in general, 71, completing high school.   ADVANCED DIRECTIVES:not in place  HEALTH MAINTENANCE: Social History  Substance Use Topics  . Smoking status: Former Smoker    Quit date: 12/23/1974  . Smokeless tobacco: Never Used  . Alcohol Use: No     Colonoscopy:  PAP:    Bone density:  2012, on ibandronate chronically  Lipid panel: UTD  Allergies  Allergen Reactions  . Bydureon [Exenatide] Rash  . Gadolinium Derivatives  Other (See Comments)    Pt began having chest numbness/tingling , stated her heart felt funny, had to take her to ED for EKG per RN   CAP  . Enalapril Rash    Current Outpatient Prescriptions  Medication Sig Dispense Refill  . amoxicillin-clavulanate (AUGMENTIN) 875-125 MG tablet Take 1 tablet by mouth 2 (two) times daily. 28 tablet 1  . aspirin EC 81 MG tablet Take 1 tablet (81 mg total) by mouth daily.    Marland Kitchen atorvastatin (LIPITOR) 80 MG tablet Take 1 tablet (80 mg total) by mouth daily. 30 tablet 11  . clotrimazole-betamethasone (LOTRISONE) cream Apply 1 application topically 2 (two) times daily. 30 g 0  . docusate sodium (COLACE) 100 MG capsule Take 1 capsule (100 mg total) by mouth 2 (two) times daily as needed. Reported on 10/17/2015 10 capsule 0  . doxepin (SINEQUAN)  25 MG capsule Take 2 capsules (50 mg total) by mouth at bedtime as needed. 180 capsule 1  . everolimus (AFINITOR) 10 MG tablet Take 1 tablet (10 mg total) by mouth daily. 28 tablet 2  . exemestane (AROMASIN) 25 MG tablet Take 1 tablet (25 mg total) by mouth daily. 30 tablet 2  . fentaNYL (DURAGESIC - DOSED MCG/HR) 25 MCG/HR patch Place 1 patch (25 mcg total) onto the skin every 3 (three) days. 5 patch 0  . fluocinonide-emollient (LIDEX-E) 0.05 % cream Apply 1 application topically 2 (two) times daily. 60 g 2  . furosemide (LASIX) 40 MG tablet Take 1 tablet (40 mg total) by mouth daily. 90 tablet 2  . ketoconazole (NIZORAL) 2 % cream Apply 1 application topically daily. 60 g 2  . levothyroxine (SYNTHROID) 175 MCG tablet Take 1 tablet (175 mcg total) by mouth daily before breakfast. 90 tablet 1  . LORazepam (ATIVAN) 0.5 MG tablet Take 15 minutes before MRI test; may repeat x 1 10 tablet 0  . lubiprostone (AMITIZA) 24 MCG capsule Take 1 capsule (24 mcg total) by mouth 2 (two) times daily with a meal. 180 capsule 1  . magic mouthwash SOLN Take 5 mLs by mouth 4 (four) times daily as needed for mouth pain. 240 mL 3  . metFORMIN  (GLUCOPHAGE) 500 MG tablet Take 1 tablet (500 mg total) by mouth daily. 90 tablet 1  . metoCLOPramide (REGLAN) 5 MG tablet Take 1 tablet (5 mg total) by mouth every 8 (eight) hours as needed for nausea. 90 tablet 12  . oxyCODONE (OXY IR/ROXICODONE) 5 MG immediate release tablet Take 1 tablet (5 mg total) by mouth every 4 (four) hours as needed for severe pain. Take 1-2 tablets every 4-6 hours as needed for pain - fill on or after 12/03/15 100 tablet 0  . Polyethyl Glycol-Propyl Glycol (SYSTANE ULTRA OP) Place 1 drop into both eyes daily.    . polyethylene glycol powder (GLYCOLAX/MIRALAX) powder Take 255 g (1 Container total) by mouth daily. 255 g 3  . potassium chloride SA (K-DUR,KLOR-CON) 20 MEQ tablet Take 1 tablet (20 mEq total) by mouth 2 (two) times daily. 180 tablet 1  . pregabalin (LYRICA) 75 MG capsule Take 1 capsule (75 mg total) by mouth 2 (two) times daily. Not taking 60 capsule 5   No current facility-administered medications for this visit.   PHYSICAL EXAM: Elderly African American woman who appears stated age 34 Vitals:   12/11/15 1441  BP: 132/66  Pulse: 83  Temp: 98 F (36.7 C)  Resp: 18   Body mass index is 33.08 kg/(m^2).    ECOG FS: 2 Filed Weights   12/11/15 1441  Weight: 169 lb 6.4 oz (76.839 kg)   Skin: rash consisting of erythematous pustules to right cheek and along chin under bottom lip HEENT: sclerae anicteric, conjunctivae pink, oropharynx clear. No thrush or mucositis.  Lymph Nodes: No cervical or supraclavicular lymphadenopathy  Lungs: clear to auscultation bilaterally, no rales, wheezes, or rhonci  Heart: regular rate and rhythm  Abdomen: round, soft, non tender, positive bowel sounds  Musculoskeletal: No focal spinal tenderness, grade 3 right upper extremity lymphedema Neuro: non focal, well oriented, positive affect  Breasts: deferred  LAB RESULTS: Lab Results  Component Value Date   WBC 4.7 12/11/2015   NEUTROABS 2.7 12/11/2015   HGB 11.0*  12/11/2015   HCT 34.8 12/11/2015   MCV 83.1 12/11/2015   PLT 218 12/11/2015      Chemistry  Component Value Date/Time   NA 142 12/03/2015 1402   NA 143 11/14/2015 1233   K 4.4 12/03/2015 1402   K 4.0 11/14/2015 1233   CL 106 12/03/2015 1402   CL 105 01/23/2013 1337   CO2 28 12/03/2015 1402   CO2 31* 11/14/2015 1233   BUN 10 12/03/2015 1402   BUN 7.9 11/14/2015 1233   CREATININE 0.71 12/03/2015 1402   CREATININE 0.8 11/14/2015 1233      Component Value Date/Time   CALCIUM 9.1 12/03/2015 1402   CALCIUM 9.2 11/14/2015 1233   ALKPHOS 77 12/03/2015 1402   ALKPHOS 74 11/14/2015 1233   AST 19 12/03/2015 1402   AST 17 11/14/2015 1233   ALT 7 12/03/2015 1402   ALT <9 11/14/2015 1233   BILITOT 0.5 12/03/2015 1402   BILITOT 0.34 11/14/2015 1233       Lab Results  Component Value Date   LABCA2 24 11/19/2011    STUDIES: Dg Sinuses Complete  12/03/2015  CLINICAL DATA:  Pain. Soreness. Prior tooth removal right lower jaw. EXAM: PARANASAL SINUSES - COMPLETE 3 + VIEW COMPARISON:  MRI 10/04/2015. FINDINGS: Minimal mucosal thickening maxillary sinuses. Mastoids are clear. No acute bony abnormality. For further dental evaluation Panorex views can be obtained as needed . IMPRESSION: 1. Minimal mucosal thickening maxillary sinuses, mild changes of chronic sinusitis cannot be excluded . 2.  No acute abnormality. Electronically Signed   By: Marcello Moores  Register   On: 12/03/2015 14:33   Mr Liver Wo Contrast  12/04/2015  CLINICAL DATA:  Metastatic right breast cancer EXAM: MRI ABDOMEN WITHOUT CONTRAST TECHNIQUE: Multiplanar multisequence MR imaging was performed without the administration of intravenous contrast. COMPARISON:  MRI abdomen dated 07/12/2015 FINDINGS: Motion degraded images. Lower chest:  Lung bases are clear. Suspected postoperative seroma in the medial right breast (series 3/ image 6). Overlying skin thickening with subcutaneous stranding, favored to reflect radiation changes.  Hepatobiliary: Multifocal hepatic metastases, likely progressed from the prior. For example: --5.5 x 4.9 cm lesion in segment 2 (series 3/image 12), previously 6.1 x 4.2 cm --3.6 x 5.1 cm lesion in segment 6 (series 3/image 18), previously 3.2 x 4.0 cm --2.4 cm lesion in segment 7 (series 3/image 13), previously 2.0 cm --12 mm lesion in segment 7 (series 3/image 10), previously 6 mm --12 mm lesion in segment 4A (series 3/image 10), unchanged --13 mm lesion in segment 4B (series 3/image 16), previously 10 mm Gallbladder is unremarkable. No intrahepatic or extrahepatic ductal dilatation. Pancreas: Within normal limits. Spleen: Within normal limits. Adrenals/Urinary Tract: Adrenal glands are within normal limits. Kidneys are within normal limits.  No hydronephrosis. Stomach/Bowel: Stomach and visualized bowel are unremarkable. Vascular/Lymphatic: No evidence of abdominal aortic aneurysm. No suspicious abdominal lymphadenopathy. Other: No abdominal ascites. Musculoskeletal: No focal osseous lesions. IMPRESSION: Mild progression of multifocal hepatic metastases, as above. Postsurgical/postradiation changes in the right breast. Electronically Signed   By: Julian Hy M.D.   On: 12/04/2015 14:09     ASSESSMENT: 75 y.o.  [place] woman with stage IV [bone only] breast cancer as of May 2014  (1)  status post right lumpectomy and axillary lymph node dissection 11/17/2010 for a pT2 pN1, stage IIB invasive ductal carcinoma, grade 2, estrogen and progesterone receptor positive, HER-2 negative,   (2) Oncotype DX recurrence score of 27 predicting a risk of distant recurrence of 18% with 5 years of tamoxifen (intermediate score);   (3)  status post radiation completed July of 2012,   (4) started anastrozole July 2012, discontinued  June 2014 with evidence of metastatic spread to bone  (5) Chronic lymphedema in the right upper extremity.  (6) pathologic fracture of the left humerus along apparent lytic lesion,  with no other bone lesions per bone scan 12/12/2012  (7) on 12/27/2012 underwent #1 intramedullary nail left pathologic proximal humerus fracture  #2 left shoulder rotator cuff repair  #3 left shoulder open bone biopsy with pathology showing metastatic breast adenocarcinoma, estrogen receptor 100% positive, progesterone receptor and HER-2 negative, with an MIB-1 of 26%. PET scan 01/13/2013 showed no other areas of disease   (8) status post 35 Gy to the left proximal humerus completed 03/13/2013(  (9) fulvestrant, started on 01/23/2013, stopped May 2017 with progression  (10) zolendronic acid given monthly, first dose 01/23/2013; changed to denosumab as of August 2015 because of access problems, being given every three months  (a) May 2017 dose help after several extractions  (11) multiple liver lesions noted on scans April 2015   (12) letrozole added May 2015, stopped 04/18/14 because of progression in her liver noted on a chest CT performed on 03/14/14  (13) everolimus and exemestane started 04/19/14, stopped May 2017  (a) MRI of the abdomen 07/12/15 showed stable disease  (b) repeat abdominal MRI 12/04/2015 shows progression  (14) to start CMF chemotherapy (cyclophosphamide, methotrexate, fluorouracil) 12/24/2015  PLAN:  The repeat liver MRI just obtained shows evidence of progression. I reassured Christy Harrell that the progression is small, but nevertheless it does indicate that the tumor has learned to grow despite her current treatment.  I currently we are stopping the exemestane and everolimus as well as the fulvestrant.  We are going to go with CMF. I explained what these drugs are what they do and she is aware of the possible toxicities, side effects and complications. Specifically she knows she may lose her hair. I wrote for prochlorperazine for her to take for nausea and we will follow her counts on day 8 after each treatment to check for neutropenia. If she does become neutropenic we  will add On Pro to subsequent treatments.  The plan is to do 4 cycles of CMF and then restage with a repeat liver MRI  In addition, given that she just had 3 teeth pulled we are going to hold the denosumab until her jaw has completely healed.  She will have a port placed next week and we are starting the CMF chemotherapy 2 weeks from now.  This alone as a good understanding of this plan. She agrees with it. She will call with any problems that may develop before her next visit.   Chauncey Cruel, MD  12/11/2015

## 2015-12-12 ENCOUNTER — Encounter: Payer: Self-pay | Admitting: Oncology

## 2015-12-12 LAB — CANCER ANTIGEN 27.29: CA 27.29: 254.7 U/mL — ABNORMAL HIGH (ref 0.0–38.6)

## 2015-12-12 NOTE — Progress Notes (Signed)
covermymeds for prior auth

## 2015-12-13 ENCOUNTER — Encounter: Payer: Self-pay | Admitting: Oncology

## 2015-12-13 NOTE — Progress Notes (Signed)
covermymeds for prior Christy Harrell approved 08/09/15-08/09/16 [id number]

## 2015-12-16 ENCOUNTER — Other Ambulatory Visit: Payer: Self-pay | Admitting: General Surgery

## 2015-12-17 ENCOUNTER — Ambulatory Visit (HOSPITAL_COMMUNITY)
Admission: RE | Admit: 2015-12-17 | Discharge: 2015-12-17 | Disposition: A | Discharge status: Home / Self Care | Payer: Medicare Other | Source: Ambulatory Visit | Attending: Oncology | Admitting: Oncology

## 2015-12-17 ENCOUNTER — Other Ambulatory Visit: Payer: Self-pay | Admitting: Oncology

## 2015-12-17 ENCOUNTER — Encounter (HOSPITAL_COMMUNITY): Payer: Self-pay

## 2015-12-17 DIAGNOSIS — Z7984 Long term (current) use of oral hypoglycemic drugs: Secondary | ICD-10-CM | POA: Insufficient documentation

## 2015-12-17 DIAGNOSIS — E785 Hyperlipidemia, unspecified: Secondary | ICD-10-CM | POA: Diagnosis not present

## 2015-12-17 DIAGNOSIS — E119 Type 2 diabetes mellitus without complications: Secondary | ICD-10-CM | POA: Insufficient documentation

## 2015-12-17 DIAGNOSIS — C50919 Malignant neoplasm of unspecified site of unspecified female breast: Secondary | ICD-10-CM | POA: Insufficient documentation

## 2015-12-17 DIAGNOSIS — Z7982 Long term (current) use of aspirin: Secondary | ICD-10-CM | POA: Diagnosis not present

## 2015-12-17 DIAGNOSIS — C787 Secondary malignant neoplasm of liver and intrahepatic bile duct: Secondary | ICD-10-CM | POA: Diagnosis not present

## 2015-12-17 DIAGNOSIS — I10 Essential (primary) hypertension: Secondary | ICD-10-CM | POA: Diagnosis not present

## 2015-12-17 DIAGNOSIS — C7951 Secondary malignant neoplasm of bone: Secondary | ICD-10-CM

## 2015-12-17 DIAGNOSIS — K219 Gastro-esophageal reflux disease without esophagitis: Secondary | ICD-10-CM | POA: Insufficient documentation

## 2015-12-17 DIAGNOSIS — M1990 Unspecified osteoarthritis, unspecified site: Secondary | ICD-10-CM | POA: Insufficient documentation

## 2015-12-17 DIAGNOSIS — Z87891 Personal history of nicotine dependence: Secondary | ICD-10-CM | POA: Diagnosis not present

## 2015-12-17 DIAGNOSIS — Z923 Personal history of irradiation: Secondary | ICD-10-CM | POA: Diagnosis not present

## 2015-12-17 DIAGNOSIS — E079 Disorder of thyroid, unspecified: Secondary | ICD-10-CM | POA: Insufficient documentation

## 2015-12-17 DIAGNOSIS — C50911 Malignant neoplasm of unspecified site of right female breast: Secondary | ICD-10-CM

## 2015-12-17 DIAGNOSIS — C50511 Malignant neoplasm of lower-outer quadrant of right female breast: Secondary | ICD-10-CM

## 2015-12-17 DIAGNOSIS — C50512 Malignant neoplasm of lower-outer quadrant of left female breast: Secondary | ICD-10-CM

## 2015-12-17 LAB — CBC
HCT: 35.2 % — ABNORMAL LOW (ref 36.0–46.0)
Hemoglobin: 11.1 g/dL — ABNORMAL LOW (ref 12.0–15.0)
MCH: 26 pg (ref 26.0–34.0)
MCHC: 31.5 g/dL (ref 30.0–36.0)
MCV: 82.4 fL (ref 78.0–100.0)
PLATELETS: 330 10*3/uL (ref 150–400)
RBC: 4.27 MIL/uL (ref 3.87–5.11)
RDW: 17 % — AB (ref 11.5–15.5)
WBC: 6.1 10*3/uL (ref 4.0–10.5)

## 2015-12-17 LAB — GLUCOSE, CAPILLARY: GLUCOSE-CAPILLARY: 78 mg/dL (ref 65–99)

## 2015-12-17 LAB — BASIC METABOLIC PANEL
Anion gap: 9 (ref 5–15)
BUN: 10 mg/dL (ref 6–20)
CALCIUM: 9.4 mg/dL (ref 8.9–10.3)
CO2: 28 mmol/L (ref 22–32)
CREATININE: 0.78 mg/dL (ref 0.44–1.00)
Chloride: 104 mmol/L (ref 101–111)
GFR calc Af Amer: >60 mL/min (ref >60)
GFR calc non Af Amer: >60 mL/min (ref >60)
GLUCOSE: 94 mg/dL (ref 65–99)
POTASSIUM: 3.9 mmol/L (ref 3.5–5.1)
SODIUM: 141 mmol/L (ref 135–145)

## 2015-12-17 LAB — PROTIME-INR
INR: 0.96 (ref 0.00–1.49)
PROTHROMBIN TIME: 12.9 s (ref 11.6–15.2)

## 2015-12-17 LAB — APTT: APTT: 31 s (ref 24–37)

## 2015-12-17 MED ORDER — SODIUM CHLORIDE 0.9 % IV SOLN
INTRAVENOUS | Status: DC
  Administered 2015-12-17: 14:00:00 via INTRAVENOUS

## 2015-12-17 MED ORDER — MIDAZOLAM HCL 2 MG/2ML IJ SOLN
INTRAMUSCULAR | Status: AC | PRN
  Administered 2015-12-17 (×3): 1 mg via INTRAVENOUS

## 2015-12-17 MED ORDER — HEPARIN SOD (PORK) LOCK FLUSH 100 UNIT/ML IV SOLN
INTRAVENOUS | Status: AC
  Filled 2015-12-17: qty 5

## 2015-12-17 MED ORDER — MIDAZOLAM HCL 2 MG/2ML IJ SOLN
INTRAMUSCULAR | Status: AC
  Filled 2015-12-17: qty 6

## 2015-12-17 MED ORDER — FENTANYL CITRATE (PF) 100 MCG/2ML IJ SOLN
INTRAMUSCULAR | Status: AC | PRN
  Administered 2015-12-17: 50 ug via INTRAVENOUS

## 2015-12-17 MED ORDER — CEFAZOLIN SODIUM-DEXTROSE 2-4 GM/100ML-% IV SOLN
2.0000 g | INTRAVENOUS | Status: AC
Start: 2015-12-17 — End: 2015-12-17
  Administered 2015-12-17: 2 g via INTRAVENOUS
  Filled 2015-12-17: qty 100

## 2015-12-17 MED ORDER — LIDOCAINE HCL 1 % IJ SOLN
INTRAMUSCULAR | Status: AC
  Filled 2015-12-17: qty 20

## 2015-12-17 MED ORDER — LIDOCAINE HCL 1 % IJ SOLN
INTRAMUSCULAR | Status: AC | PRN
  Administered 2015-12-17: 10 mL

## 2015-12-17 MED ORDER — FENTANYL CITRATE (PF) 100 MCG/2ML IJ SOLN
INTRAMUSCULAR | Status: AC
  Filled 2015-12-17: qty 4

## 2015-12-17 MED ORDER — HEPARIN SOD (PORK) LOCK FLUSH 100 UNIT/ML IV SOLN
INTRAVENOUS | Status: AC | PRN
  Administered 2015-12-17: 500 [IU]

## 2015-12-17 NOTE — Procedures (Signed)
Interventional Radiology Procedure Note  Procedure: Placement of a left IJ approach single lumen PowerPort.  Tip is positioned at the superior cavoatrial junction and catheter is ready for immediate use.  Complications: None Recommendations:  - Ok to shower tomorrow - Do not submerge for 7 days - Routine line care   Signed,  Ladislao Cohenour S. Pilar Corrales, DO    

## 2015-12-17 NOTE — H&P (Signed)
Chief Complaint: breast cancer with liver lesions  Referring Physician:Dr. Lurline Del  Supervising Physician: Corrie Mckusick  Patient Status:  Out-pt  HPI: Christy Harrell is an 75 y.o. female with a history of breast cancer who had a pathologic humerus fracture in 2014 that require repair from metastatic disease from her breast cancer.  She has been found to have liver lesions.  These have shown some progression on her recent MRI.  The decision has been made to start systemic chemotherapy; therefore, the patient will need a port a cath.  She is here today for this procedure.  Past Medical History:  Past Medical History  Diagnosis Date  . Club foot   . Arthritis   . Thyroid disease   . Hyperlipidemia   . Osteoporosis   . Hypertension   . GERD (gastroesophageal reflux disease)     watches diet  . Neuromuscular disorder (HCC)     lt arm numb sometimes  . Wears glasses   . History of radiation therapy 02/22/13- 03/13/13    left proximal humerus 3500 cGy 14 sessions  . Lymphedema     right arm  . Diabetes mellitus     metformin  . Cancer [facility])     right breast  . Metastasis from malignant tumor of breast ([place])     left humerous  . Metastasis from malignant tumor of breast [facility])     liver    Past Surgical History:  Past Surgical History  Procedure Laterality Date  . Cesarean section    . Thyroidectomy  2002  . Foot surgery      as a child for club foot  . Breast surgery  2012    rt lump-16 nodes-in [place]  . Cesarean section    . Colonoscopy    . Dilation and curettage of uterus    . Humerus im nail Left 12/27/2012    Procedure: INTRAMEDULLARY (IM) NAIL HUMERAL LEFT PATHOLOGIC FRACTURE;  Surgeon: Nita Sells, MD;  Location: [place];  Service: Orthopedics;  Laterality: Left;    Family History:  Family History  Problem Relation Age of Onset  . Cancer Neg Hx   . Arthritis Neg Hx   . Heart disease Neg Hx   . Hyperlipidemia Neg Hx     . Hypertension Neg Hx   . Dementia Mother   . Hearing loss Mother     Social History:  reports that she quit smoking about 41 years ago. She has never used smokeless tobacco. She reports that she does not drink alcohol or use illicit drugs.  Allergies:  Allergies  Allergen Reactions  . Bydureon [Exenatide] Rash  . Gadolinium Derivatives Other (See Comments)    Pt began having chest numbness/tingling , stated her heart felt funny, had to take her to ED for EKG per RN   CAP  . Enalapril Rash    Medications:   Medication List    ASK your doctor about these medications        amoxicillin-clavulanate 875-125 MG tablet  Commonly known as:  AUGMENTIN  Take 1 tablet by mouth 2 (two) times daily.     aspirin EC 81 MG tablet  Take 1 tablet (81 mg total) by mouth daily.     atorvastatin 80 MG tablet  Commonly known as:  LIPITOR  Take 1 tablet (80 mg total) by mouth daily.     clotrimazole-betamethasone cream  Commonly known as:  LOTRISONE  Apply 1 application topically 2 (  two) times daily.     docusate sodium 100 MG capsule  Commonly known as:  COLACE  Take 1 capsule (100 mg total) by mouth 2 (two) times daily as needed. Reported on 10/17/2015     doxepin 25 MG capsule  Commonly known as:  SINEQUAN  Take 2 capsules (50 mg total) by mouth at bedtime as needed.     fentaNYL 25 MCG/HR patch  Commonly known as:  DURAGESIC - dosed mcg/hr  Place 1 patch (25 mcg total) onto the skin every 3 (three) days.     fluocinonide-emollient 0.05 % cream  Commonly known as:  LIDEX-E  Apply 1 application topically 2 (two) times daily.     furosemide 40 MG tablet  Commonly known as:  LASIX  Take 1 tablet (40 mg total) by mouth daily.     ketoconazole 2 % cream  Commonly known as:  NIZORAL  Apply 1 application topically daily.     levothyroxine 175 MCG tablet  Commonly known as:  SYNTHROID  Take 1 tablet (175 mcg total) by mouth daily before breakfast.     lidocaine-prilocaine cream   Commonly known as:  EMLA  Apply to affected area once     LORazepam 0.5 MG tablet  Commonly known as:  ATIVAN  Take 15 minutes before MRI test; may repeat x 1     lubiprostone 24 MCG capsule  Commonly known as:  AMITIZA  Take 1 capsule (24 mcg total) by mouth 2 (two) times daily with a meal.     magic mouthwash Soln  Take 5 mLs by mouth 4 (four) times daily as needed for mouth pain.     metFORMIN 500 MG tablet  Commonly known as:  GLUCOPHAGE  Take 1 tablet (500 mg total) by mouth daily.     metoCLOPramide 5 MG tablet  Commonly known as:  REGLAN  Take 1 tablet (5 mg total) by mouth every 8 (eight) hours as needed for nausea.     ondansetron 8 MG tablet  Commonly known as:  ZOFRAN  Take twice a day starting the evenng of the day of chemo and continuing 2 full days     oxyCODONE 5 MG immediate release tablet  Commonly known as:  Oxy IR/ROXICODONE  Take 1 tablet (5 mg total) by mouth every 4 (four) hours as needed for severe pain. Take 1-2 tablets every 4-6 hours as needed for pain - fill on or after 12/03/15     polyethylene glycol powder powder  Commonly known as:  GLYCOLAX/MIRALAX  Take 255 g (1 Container total) by mouth daily.     potassium chloride SA 20 MEQ tablet  Commonly known as:  K-DUR,KLOR-CON  Take 1 tablet (20 mEq total) by mouth 2 (two) times daily.     pregabalin 75 MG capsule  Commonly known as:  LYRICA  Take 1 capsule (75 mg total) by mouth 2 (two) times daily. Not taking     prochlorperazine 10 MG tablet  Commonly known as:  COMPAZINE  Take one tablet three times a day before meals tarting the evening of chemo and continuing for an additional two days     SYSTANE ULTRA OP  Place 1 drop into both eyes daily.        Please HPI for pertinent positives, otherwise complete 10 system ROS negative, except for significant right upper extremity lymphedema.  Mallampati Score: MD Evaluation Airway: WNL Heart: WNL Abdomen: WNL Chest/ Lungs: WNL ASA   Classification: 3 Mallampati/Airway Score:  Two  Physical Exam: BP 130/71 mmHg  Pulse 77  Temp(Src) 98.3 F (36.8 C) (Oral)  Resp 18  SpO2 100% There is no weight on file to calculate BMI. General: pleasant, WD, WN black female who is laying in bed in NAD HEENT: head is normocephalic, atraumatic.  Sclera are noninjected.  PERRL.  Ears and nose without any masses or lesions.  Mouth is pink and moist Heart: regular, rate, and rhythm.  Normal s1,s2. No obvious murmurs, gallops, or rubs noted.  Palpable radial and pedal pulses bilaterally Lungs: CTAB, no wheezes, rhonchi, or rales noted.  Respiratory effort nonlabored Abd: soft, NT, ND, +BS, no masses, hernias, or organomegaly MS: all 4 extremities are symmetrical with no cyanosis, clubbing, or edema. However, she has significant RUE lymphedema.  She also has some disfiguration of her feet secondary to club foot as a child Psych: A&Ox3 with an appropriate affect.   Labs: Results for orders placed or performed during the hospital encounter of 12/17/15 (from the past 48 hour(s))  APTT     Status: None   Collection Time: 12/17/15  1:13 PM  Result Value Ref Range   aPTT 31 24 - 37 seconds  Basic metabolic panel     Status: None   Collection Time: 12/17/15  1:13 PM  Result Value Ref Range   Sodium 141 135 - 145 mmol/L   Potassium 3.9 3.5 - 5.1 mmol/L   Chloride 104 101 - 111 mmol/L   CO2 28 22 - 32 mmol/L   Glucose, Bld 94 65 - 99 mg/dL   BUN 10 6 - 20 mg/dL   Creatinine, Ser 0.78 0.44 - 1.00 mg/dL   Calcium 9.4 8.9 - 10.3 mg/dL   GFR calc non Af Amer >60 >60 mL/min   GFR calc Af Amer >60 >60 mL/min    Comment: (NOTE) The eGFR has been calculated using the CKD EPI equation. This calculation has not been validated in all clinical situations. eGFR's persistently <60 mL/min signify possible Chronic Kidney Disease.    Anion gap 9 5 - 15  CBC     Status: Abnormal   Collection Time: 12/17/15  1:13 PM  Result Value Ref Range   WBC 6.1  4.0 - 10.5 K/uL   RBC 4.27 3.87 - 5.11 MIL/uL   Hemoglobin 11.1 (L) 12.0 - 15.0 g/dL   HCT 35.2 (L) 36.0 - 46.0 %   MCV 82.4 78.0 - 100.0 fL   MCH 26.0 26.0 - 34.0 pg   MCHC 31.5 30.0 - 36.0 g/dL   RDW 17.0 (H) 11.5 - 15.5 %   Platelets 330 150 - 400 K/uL  Protime-INR     Status: None   Collection Time: 12/17/15  1:13 PM  Result Value Ref Range   Prothrombin Time 12.9 11.6 - 15.2 seconds   INR 0.96 0.00 - 1.49    Imaging: No results found.  Assessment/Plan 1, breast cancer with liver lesions -vitals and labs have been reviewed -plan to proceed with PAC placement today -Risks and Benefits discussed with the patient including, but not limited to bleeding, infection, pneumothorax, or fibrin sheath development and need for additional procedures. All of the patient's questions were answered, patient is agreeable to proceed. Consent signed and in chart.    Thank you for this interesting consult.  I greatly enjoyed meeting Priseis Cratty and look forward to participating in their care.  A copy of this report was sent to the requesting provider on this date.  Electronically Signed:  Kostantinos Tallman E 12/17/2015, 2:02 PM   I spent a total of  30 Minutes   in face to face in clinical consultation, greater than 50% of which was counseling/coordinating care for breast cancer, needs PAC

## 2015-12-17 NOTE — Discharge Instructions (Signed)

## 2015-12-24 ENCOUNTER — Other Ambulatory Visit (HOSPITAL_BASED_OUTPATIENT_CLINIC_OR_DEPARTMENT_OTHER): Payer: Medicare Other

## 2015-12-24 ENCOUNTER — Ambulatory Visit (HOSPITAL_BASED_OUTPATIENT_CLINIC_OR_DEPARTMENT_OTHER): Payer: Medicare Other

## 2015-12-24 ENCOUNTER — Encounter: Payer: Self-pay | Admitting: Nurse Practitioner

## 2015-12-24 ENCOUNTER — Ambulatory Visit (HOSPITAL_BASED_OUTPATIENT_CLINIC_OR_DEPARTMENT_OTHER): Payer: Medicare Other | Admitting: Nurse Practitioner

## 2015-12-24 ENCOUNTER — Telehealth: Payer: Self-pay | Admitting: Oncology

## 2015-12-24 VITALS — BP 134/65 | HR 95 | Temp 99.0°F | Resp 19 | Ht 60.0 in | Wt 171.3 lb

## 2015-12-24 DIAGNOSIS — C7951 Secondary malignant neoplasm of bone: Secondary | ICD-10-CM | POA: Diagnosis not present

## 2015-12-24 DIAGNOSIS — C50512 Malignant neoplasm of lower-outer quadrant of left female breast: Secondary | ICD-10-CM

## 2015-12-24 DIAGNOSIS — Z5111 Encounter for antineoplastic chemotherapy: Secondary | ICD-10-CM | POA: Diagnosis not present

## 2015-12-24 DIAGNOSIS — C787 Secondary malignant neoplasm of liver and intrahepatic bile duct: Secondary | ICD-10-CM | POA: Diagnosis not present

## 2015-12-24 DIAGNOSIS — I890 Lymphedema, not elsewhere classified: Secondary | ICD-10-CM | POA: Diagnosis not present

## 2015-12-24 DIAGNOSIS — K5900 Constipation, unspecified: Secondary | ICD-10-CM

## 2015-12-24 LAB — COMPREHENSIVE METABOLIC PANEL
ALBUMIN: 2.9 g/dL — AB (ref 3.5–5.0)
ALK PHOS: 66 U/L (ref 40–150)
ALT: 9 U/L (ref 0–55)
AST: 20 U/L (ref 5–34)
Anion Gap: 4 mEq/L (ref 3–11)
BUN: 10.3 mg/dL (ref 7.0–26.0)
CALCIUM: 9.4 mg/dL (ref 8.4–10.4)
CO2: 30 mEq/L — ABNORMAL HIGH (ref 22–29)
Chloride: 106 mEq/L (ref 98–109)
Creatinine: 0.8 mg/dL (ref 0.6–1.1)
EGFR: 84 mL/min/{1.73_m2} — AB (ref >90)
Glucose: 119 mg/dl (ref 70–140)
POTASSIUM: 4.3 meq/L (ref 3.5–5.1)
Sodium: 141 mEq/L (ref 136–145)
Total Protein: 6.9 g/dL (ref 6.4–8.3)

## 2015-12-24 LAB — CBC WITH DIFFERENTIAL/PLATELET
BASO%: 0.7 % (ref 0.0–2.0)
Basophils Absolute: 0 10*3/uL (ref 0.0–0.1)
EOS ABS: 0.1 10*3/uL (ref 0.0–0.5)
EOS%: 1.7 % (ref 0.0–7.0)
HCT: 32.6 % — ABNORMAL LOW (ref 34.8–46.6)
HEMOGLOBIN: 10.1 g/dL — AB (ref 11.6–15.9)
LYMPH#: 1.6 10*3/uL (ref 0.9–3.3)
LYMPH%: 27.7 % (ref 14.0–49.7)
MCH: 26.1 pg (ref 25.1–34.0)
MCHC: 31 g/dL — ABNORMAL LOW (ref 31.5–36.0)
MCV: 84.2 fL (ref 79.5–101.0)
MONO#: 0.7 10*3/uL (ref 0.1–0.9)
MONO%: 12.2 % (ref 0.0–14.0)
NEUT%: 57.7 % (ref 38.4–76.8)
NEUTROS ABS: 3.3 10*3/uL (ref 1.5–6.5)
PLATELETS: 287 10*3/uL (ref 145–400)
RBC: 3.87 10*6/uL (ref 3.70–5.45)
RDW: 17.7 % — ABNORMAL HIGH (ref 11.2–14.5)
WBC: 5.7 10*3/uL (ref 3.9–10.3)

## 2015-12-24 MED ORDER — SODIUM CHLORIDE 0.9 % IV SOLN
Freq: Once | INTRAVENOUS | Status: AC
  Administered 2015-12-24: 12:00:00 via INTRAVENOUS

## 2015-12-24 MED ORDER — SODIUM CHLORIDE 0.9 % IV SOLN
600.0000 mg/m2 | Freq: Once | INTRAVENOUS | Status: AC
  Administered 2015-12-24: 1080 mg via INTRAVENOUS
  Filled 2015-12-24: qty 54

## 2015-12-24 MED ORDER — METHOTREXATE SODIUM (PF) CHEMO INJECTION 250 MG/10ML
40.0000 mg/m2 | Freq: Once | INTRAMUSCULAR | Status: AC
  Administered 2015-12-24: 72 mg via INTRAVENOUS
  Filled 2015-12-24: qty 2.88

## 2015-12-24 MED ORDER — PALONOSETRON HCL INJECTION 0.25 MG/5ML
0.2500 mg | Freq: Once | INTRAVENOUS | Status: AC
  Administered 2015-12-24: 0.25 mg via INTRAVENOUS

## 2015-12-24 MED ORDER — FLUOROURACIL CHEMO INJECTION 2.5 GM/50ML
600.0000 mg/m2 | Freq: Once | INTRAVENOUS | Status: AC
  Administered 2015-12-24: 1100 mg via INTRAVENOUS
  Filled 2015-12-24: qty 22

## 2015-12-24 MED ORDER — SODIUM CHLORIDE 0.9% FLUSH
10.0000 mL | INTRAVENOUS | Status: DC | PRN
  Administered 2015-12-24: 10 mL
  Filled 2015-12-24: qty 10

## 2015-12-24 MED ORDER — SODIUM CHLORIDE 0.9 % IV SOLN
10.0000 mg | Freq: Once | INTRAVENOUS | Status: AC
  Administered 2015-12-24: 10 mg via INTRAVENOUS
  Filled 2015-12-24: qty 1

## 2015-12-24 MED ORDER — PALONOSETRON HCL INJECTION 0.25 MG/5ML
INTRAVENOUS | Status: AC
  Filled 2015-12-24: qty 5

## 2015-12-24 MED ORDER — HEPARIN SOD (PORK) LOCK FLUSH 100 UNIT/ML IV SOLN
500.0000 [IU] | Freq: Once | INTRAVENOUS | Status: AC | PRN
  Administered 2015-12-24: 500 [IU]
  Filled 2015-12-24: qty 5

## 2015-12-24 NOTE — Telephone Encounter (Signed)
Added appts °

## 2015-12-24 NOTE — Progress Notes (Signed)
1305- Pt tolerated first time chemo today. Gave pt chemo card in case of an emergency. PT AVS printed with chemo information and what to look for. In basket follow up call placed. Pt understands instructions on possible side effects and when to call for any concerns. Encouraged pt to drink plenty of fluids in the next few days and reviewed nausea medication at home.

## 2015-12-24 NOTE — Patient Instructions (Signed)
[facility] Discharge Instructions for Patients Receiving Chemotherapy  Today you received the following chemotherapy agents Cytoxan, Methotrexate, Fluorouracil  To help prevent nausea and vomiting after your treatment, we encourage you to take your nausea medication take compazine as needed and ondanzetron after Day 3 after chemo.   If you develop nausea and vomiting that is not controlled by your nausea medication, call the clinic.   BELOW ARE SYMPTOMS THAT SHOULD BE REPORTED IMMEDIATELY:  *FEVER GREATER THAN 100.5 F  *CHILLS WITH OR WITHOUT FEVER  NAUSEA AND VOMITING THAT IS NOT CONTROLLED WITH YOUR NAUSEA MEDICATION  *UNUSUAL SHORTNESS OF BREATH  *UNUSUAL BRUISING OR BLEEDING  TENDERNESS IN MOUTH AND THROAT WITH OR WITHOUT PRESENCE OF ULCERS  *URINARY PROBLEMS  *BOWEL PROBLEMS  UNUSUAL RASH Items with * indicate a potential emergency and should be followed up as soon as possible.  Feel free to call the clinic you have any questions or concerns. The clinic phone number is [phone].  Please show the [place] [facility] at check-in to the Emergency Department and triage nurse.  Cyclophosphamide injection (CYTOXAN) What is this medicine? CYCLOPHOSPHAMIDE (sye kloe FOSS fa mide) is a chemotherapy drug. It slows the growth of cancer cells. This medicine is used to treat many types of cancer like lymphoma, myeloma, leukemia, breast cancer, and ovarian cancer, to name a few. This medicine may be used for other purposes; ask your health care provider or pharmacist if you have questions. What should I tell my health care provider before I take this medicine? They need to know if you have any of these conditions: -blood disorders -history of other chemotherapy -infection -kidney disease -liver disease -recent or ongoing radiation therapy -tumors in the bone marrow -an unusual or allergic reaction to cyclophosphamide, other chemotherapy, other medicines,  foods, dyes, or preservatives -pregnant or trying to get pregnant -breast-feeding How should I use this medicine? This drug is usually given as an injection into a vein or muscle or by infusion into a vein. It is administered in a hospital or clinic by a specially trained health care professional. Talk to your pediatrician regarding the use of this medicine in children. Special care may be needed. Overdosage: If you think you have taken too much of this medicine contact a poison control center or emergency room at once. NOTE: This medicine is only for you. Do not share this medicine with others. What if I miss a dose? It is important not to miss your dose. Call your doctor or health care professional if you are unable to keep an appointment. What may interact with this medicine? This medicine may interact with the following medications: -amiodarone -amphotericin B -azathioprine -certain antiviral medicines for HIV or AIDS such as protease inhibitors (e.g., indinavir, ritonavir) and zidovudine -certain blood pressure medications such as benazepril, captopril, enalapril, fosinopril, lisinopril, moexipril, monopril, perindopril, quinapril, ramipril, trandolapril -certain cancer medications such as anthracyclines (e.g., daunorubicin, doxorubicin), busulfan, cytarabine, paclitaxel, pentostatin, tamoxifen, trastuzumab -certain diuretics such as chlorothiazide, chlorthalidone, hydrochlorothiazide, indapamide, metolazone -certain medicines that treat or prevent blood clots like warfarin -certain muscle relaxants such as succinylcholine -cyclosporine -etanercept -indomethacin -medicines to increase blood counts like filgrastim, pegfilgrastim, sargramostim -medicines used as general anesthesia -metronidazole -natalizumab This list may not describe all possible interactions. Give your health care provider a list of all the medicines, herbs, non-prescription drugs, or dietary supplements you use.  Also tell them if you smoke, drink alcohol, or use illegal drugs. Some items may interact with your medicine.  What should I watch for while using this medicine? Visit your doctor for checks on your progress. This drug may make you feel generally unwell. This is not uncommon, as chemotherapy can affect healthy cells as well as cancer cells. Report any side effects. Continue your course of treatment even though you feel ill unless your doctor tells you to stop. Drink water or other fluids as directed. Urinate often, even at night. In some cases, you may be given additional medicines to help with side effects. Follow all directions for their use. Call your doctor or health care professional for advice if you get a fever, chills or sore throat, or other symptoms of a cold or flu. Do not treat yourself. This drug decreases your body's ability to fight infections. Try to avoid being around people who are sick. This medicine may increase your risk to bruise or bleed. Call your doctor or health care professional if you notice any unusual bleeding. Be careful brushing and flossing your teeth or using a toothpick because you may get an infection or bleed more easily. If you have any dental work done, tell your dentist you are receiving this medicine. You may get drowsy or dizzy. Do not drive, use machinery, or do anything that needs mental alertness until you know how this medicine affects you. Do not become pregnant while taking this medicine or for 1 year after stopping it. Women should inform their doctor if they wish to become pregnant or think they might be pregnant. Men should not father a child while taking this medicine and for 4 months after stopping it. There is a potential for serious side effects to an unborn child. Talk to your health care professional or pharmacist for more information. Do not breast-feed an infant while taking this medicine. This medicine may interfere with the ability to have a  child. This medicine has caused ovarian failure in some women. This medicine has caused reduced sperm counts in some men. You should talk with your doctor or health care professional if you are concerned about your fertility. If you are going to have surgery, tell your doctor or health care professional that you have taken this medicine. What side effects may I notice from receiving this medicine? Side effects that you should report to your doctor or health care professional as soon as possible: -allergic reactions like skin rash, itching or hives, swelling of the face, lips, or tongue -low blood counts - this medicine may decrease the number of white blood cells, red blood cells and platelets. You may be at increased risk for infections and bleeding. -signs of infection - fever or chills, cough, sore throat, pain or difficulty passing urine -signs of decreased platelets or bleeding - bruising, pinpoint red spots on the skin, black, tarry stools, blood in the urine -signs of decreased red blood cells - unusually weak or tired, fainting spells, lightheadedness -breathing problems -dark urine -dizziness -palpitations -swelling of the ankles, feet, hands -trouble passing urine or change in the amount of urine -weight gain -yellowing of the eyes or skin Side effects that usually do not require medical attention (report to your doctor or health care professional if they continue or are bothersome): -changes in nail or skin color -hair loss -missed menstrual periods -mouth sores -nausea, vomiting This list may not describe all possible side effects. Call your doctor for medical advice about side effects. You may report side effects to FDA at 1-800-FDA-1088. Where should I keep my medicine? This drug is  given in a hospital or clinic and will not be stored at home. NOTE: This sheet is a summary. It may not cover all possible information. If you have questions about this medicine, talk to your  doctor, pharmacist, or health care provider.    2016, [employer]. (2012-06-10 16:22:58)  Methotrexate injection What is this medicine? METHOTREXATE (METH oh TREX ate) is a chemotherapy drug used to treat cancer including breast cancer, leukemia, and lymphoma. This medicine can also be used to treat psoriasis and certain kinds of arthritis. This medicine may be used for other purposes; ask your health care provider or pharmacist if you have questions. What should I tell my health care provider before I take this medicine? They need to know if you have any of these conditions: -fluid in the stomach area or lungs -if you often drink alcohol -infection or immune system problems -kidney disease -liver disease -low blood counts, like low white cell, platelet, or red cell counts -lung disease -radiation therapy -stomach ulcers -ulcerative colitis -an unusual or allergic reaction to methotrexate, other medicines, foods, dyes, or preservatives -pregnant or trying to get pregnant -breast-feeding How should I use this medicine? This medicine is for infusion into a vein or for injection into muscle or into the spinal fluid (whichever applies). It is usually given by a health care professional in a hospital or clinic setting. In rare cases, you might get this medicine at home. You will be taught how to give this medicine. Use exactly as directed. Take your medicine at regular intervals. Do not take your medicine more often than directed. If this medicine is used for arthritis or psoriasis, it should be taken weekly, NOT daily. It is important that you put your used needles and syringes in a special sharps container. Do not put them in a trash can. If you do not have a sharps container, call your pharmacist or healthcare provider to get one. Talk to your pediatrician regarding the use of this medicine in children. While this drug may be prescribed for children as young as 2 years for  selected conditions, precautions do apply. Overdosage: If you think you have taken too much of this medicine contact a poison control center or emergency room at once. NOTE: This medicine is only for you. Do not share this medicine with others. What if I miss a dose? It is important not to miss your dose. Call your doctor or health care professional if you are unable to keep an appointment. If you give yourself the medicine and you miss a dose, talk with your doctor or health care professional. Do not take double or extra doses. What may interact with this medicine? This medicine may interact with the following medications: -acitretin -aspirin or aspirin-like medicines including salicylates -azathioprine -certain antibiotics like chloramphenicol, penicillin, tetracycline -certain medicines for stomach problems like esomeprazole, omeprazole, pantoprazole -cyclosporine -gold -hydroxychloroquine -live virus vaccines -mercaptopurine -NSAIDs, medicines for pain and inflammation, like ibuprofen or naproxen -other cytotoxic agents -penicillamine -phenylbutazone -phenytoin -probenacid -retinoids such as isotretinoin and tretinoin -steroid medicines like prednisone or cortisone -sulfonamides like sulfasalazine and trimethoprim/sulfamethoxazole -theophylline This list may not describe all possible interactions. Give your health care provider a list of all the medicines, herbs, non-prescription drugs, or dietary supplements you use. Also tell them if you smoke, drink alcohol, or use illegal drugs. Some items may interact with your medicine. What should I watch for while using this medicine? Avoid alcoholic drinks. In some cases, you may be given additional medicines  to help with side effects. Follow all directions for their use. This medicine can make you more sensitive to the sun. Keep out of the sun. If you cannot avoid being in the sun, wear protective clothing and use sunscreen. Do not use  sun lamps or tanning beds/booths. You may get drowsy or dizzy. Do not drive, use machinery, or do anything that needs mental alertness until you know how this medicine affects you. Do not stand or sit up quickly, especially if you are an older patient. This reduces the risk of dizzy or fainting spells. You may need blood work done while you are taking this medicine. Call your doctor or health care professional for advice if you get a fever, chills or sore throat, or other symptoms of a cold or flu. Do not treat yourself. This drug decreases your body's ability to fight infections. Try to avoid being around people who are sick. This medicine may increase your risk to bruise or bleed. Call your doctor or health care professional if you notice any unusual bleeding. Check with your doctor or health care professional if you get an attack of severe diarrhea, nausea and vomiting, or if you sweat a lot. The loss of too much body fluid can make it dangerous for you to take this medicine. Talk to your doctor about your risk of cancer. You may be more at risk for certain types of cancers if you take this medicine. Both men and women must use effective birth control with this medicine. Do not become pregnant while taking this medicine or until at least 1 normal menstrual cycle has occurred after stopping it. Women should inform their doctor if they wish to become pregnant or think they might be pregnant. Men should not father a child while taking this medicine and for 3 months after stopping it. There is a potential for serious side effects to an unborn child. Talk to your health care professional or pharmacist for more information. Do not breast-feed an infant while taking this medicine. What side effects may I notice from receiving this medicine? Side effects that you should report to your doctor or health care professional as soon as possible: -allergic reactions like skin rash, itching or hives, swelling of the  face, lips, or tongue -back pain -breathing problems or shortness of breath -confusion -diarrhea -dry, nonproductive cough -low blood counts - this medicine may decrease the number of white blood cells, red blood cells and platelets. You may be at increased risk of infections and bleeding -mouth sores -redness, blistering, peeling or loosening of the skin, including inside the mouth -seizures -severe headaches -signs of infection - fever or chills, cough, sore throat, pain or difficulty passing urine -signs and symptoms of bleeding such as bloody or black, tarry stools; red or dark-brown urine; spitting up blood or brown material that looks like coffee grounds; red spots on the skin; unusual bruising or bleeding from the eye, gums, or nose -signs and symptoms of kidney injury like trouble passing urine or change in the amount of urine -signs and symptoms of liver injury like dark yellow or brown urine; general ill feeling or flu-like symptoms; light-colored stools; loss of appetite; nausea; right upper belly pain; unusually weak or tired; yellowing of the eyes or skin -stiff neck -vomiting Side effects that usually do not require medical attention (report to your doctor or health care professional if they continue or are bothersome): -dizziness -hair loss -headache -stomach pain -upset stomach This list may not describe  all possible side effects. Call your doctor for medical advice about side effects. You may report side effects to FDA at 1-800-FDA-1088. Where should I keep my medicine? If you are using this medicine at home, you will be instructed on how to store this medicine. Throw away any unused medicine after the expiration date on the label. NOTE: This sheet is a summary. It may not cover all possible information. If you have questions about this medicine, talk to your doctor, pharmacist, or health care provider.    2016, [employer]. (2014-11-15  12:36:41)   Fluorouracil, 5-FU injection What is this medicine? FLUOROURACIL, 5-FU (flure oh YOOR a sil) is a chemotherapy drug. It slows the growth of cancer cells. This medicine is used to treat many types of cancer like breast cancer, colon or rectal cancer, pancreatic cancer, and stomach cancer. This medicine may be used for other purposes; ask your health care provider or pharmacist if you have questions. What should I tell my health care provider before I take this medicine? They need to know if you have any of these conditions: -blood disorders -dihydropyrimidine dehydrogenase (DPD) deficiency -infection (especially a virus infection such as chickenpox, cold sores, or herpes) -kidney disease -liver disease -malnourished, poor nutrition -recent or ongoing radiation therapy -an unusual or allergic reaction to fluorouracil, other chemotherapy, other medicines, foods, dyes, or preservatives -pregnant or trying to get pregnant -breast-feeding How should I use this medicine? This drug is given as an infusion or injection into a vein. It is administered in a hospital or clinic by a specially trained health care professional. Talk to your pediatrician regarding the use of this medicine in children. Special care may be needed. Overdosage: If you think you have taken too much of this medicine contact a poison control center or emergency room at once. NOTE: This medicine is only for you. Do not share this medicine with others. What if I miss a dose? It is important not to miss your dose. Call your doctor or health care professional if you are unable to keep an appointment. What may interact with this medicine? -allopurinol -cimetidine -dapsone -digoxin -hydroxyurea -leucovorin -levamisole -medicines for seizures like ethotoin, fosphenytoin, phenytoin -medicines to increase blood counts like filgrastim, pegfilgrastim, sargramostim -medicines that treat or prevent blood clots like  warfarin, enoxaparin, and dalteparin -methotrexate -metronidazole -pyrimethamine -some other chemotherapy drugs like busulfan, cisplatin, estramustine, vinblastine -trimethoprim -trimetrexate -vaccines Talk to your doctor or health care professional before taking any of these medicines: -acetaminophen -aspirin -ibuprofen -ketoprofen -naproxen This list may not describe all possible interactions. Give your health care provider a list of all the medicines, herbs, non-prescription drugs, or dietary supplements you use. Also tell them if you smoke, drink alcohol, or use illegal drugs. Some items may interact with your medicine. What should I watch for while using this medicine? Visit your doctor for checks on your progress. This drug may make you feel generally unwell. This is not uncommon, as chemotherapy can affect healthy cells as well as cancer cells. Report any side effects. Continue your course of treatment even though you feel ill unless your doctor tells you to stop. In some cases, you may be given additional medicines to help with side effects. Follow all directions for their use. Call your doctor or health care professional for advice if you get a fever, chills or sore throat, or other symptoms of a cold or flu. Do not treat yourself. This drug decreases your body's ability to fight infections. Try to avoid  being around people who are sick. This medicine may increase your risk to bruise or bleed. Call your doctor or health care professional if you notice any unusual bleeding. Be careful brushing and flossing your teeth or using a toothpick because you may get an infection or bleed more easily. If you have any dental work done, tell your dentist you are receiving this medicine. Avoid taking products that contain aspirin, acetaminophen, ibuprofen, naproxen, or ketoprofen unless instructed by your doctor. These medicines may hide a fever. Do not become pregnant while taking this medicine.  Women should inform their doctor if they wish to become pregnant or think they might be pregnant. There is a potential for serious side effects to an unborn child. Talk to your health care professional or pharmacist for more information. Do not breast-feed an infant while taking this medicine. Men should inform their doctor if they wish to father a child. This medicine may lower sperm counts. Do not treat diarrhea with over the counter products. Contact your doctor if you have diarrhea that lasts more than 2 days or if it is severe and watery. This medicine can make you more sensitive to the sun. Keep out of the sun. If you cannot avoid being in the sun, wear protective clothing and use sunscreen. Do not use sun lamps or tanning beds/booths. What side effects may I notice from receiving this medicine? Side effects that you should report to your doctor or health care professional as soon as possible: -allergic reactions like skin rash, itching or hives, swelling of the face, lips, or tongue -low blood counts - this medicine may decrease the number of white blood cells, red blood cells and platelets. You may be at increased risk for infections and bleeding. -signs of infection - fever or chills, cough, sore throat, pain or difficulty passing urine -signs of decreased platelets or bleeding - bruising, pinpoint red spots on the skin, black, tarry stools, blood in the urine -signs of decreased red blood cells - unusually weak or tired, fainting spells, lightheadedness -breathing problems -changes in vision -chest pain -mouth sores -nausea and vomiting -pain, swelling, redness at site where injected -pain, tingling, numbness in the hands or feet -redness, swelling, or sores on hands or feet -stomach pain -unusual bleeding Side effects that usually do not require medical attention (report to your doctor or health care professional if they continue or are bothersome): -changes in finger or toe  nails -diarrhea -dry or itchy skin -hair loss -headache -loss of appetite -sensitivity of eyes to the light -stomach upset -unusually teary eyes This list may not describe all possible side effects. Call your doctor for medical advice about side effects. You may report side effects to FDA at 1-800-FDA-1088. Where should I keep my medicine? This drug is given in a hospital or clinic and will not be stored at home. NOTE: This sheet is a summary. It may not cover all possible information. If you have questions about this medicine, talk to your doctor, pharmacist, or health care provider.    2016, [employer]. (2007-11-30 13:53:16)  Palonosetron Injection ALOXI What is this medicine? PALONOSETRON (pal oh NOE se tron) is used to prevent nausea and vomiting caused by chemotherapy. It also helps prevent delayed nausea and vomiting that may occur a few days after your treatment. This medicine may be used for other purposes; ask your health care provider or pharmacist if you have questions. What should I tell my health care provider before I take this medicine?  They need to know if you have any of these conditions: -an unusual or allergic reaction to palonosetron, dolasetron, granisetron, ondansetron, other medicines, foods, dyes, or preservatives -pregnant or trying to get pregnant -breast-feeding How should I use this medicine? This medicine is for infusion into a vein. It is given by a health care professional in a hospital or clinic setting. Talk to your pediatrician regarding the use of this medicine in children. While this drug may be prescribed for children as young as 1 month for selected conditions, precautions do apply. Overdosage: If you think you have taken too much of this medicine contact a poison control center or emergency room at once. NOTE: This medicine is only for you. Do not share this medicine with others. What if I miss a dose? This does not apply. What may  interact with this medicine? -certain medicines for depression, anxiety, or psychotic disturbances -fentanyl -linezolid -MAOIs like Carbex, Eldepryl, Marplan, Nardil, and Parnate -methylene blue (injected into a vein) -tramadol This list may not describe all possible interactions. Give your health care provider a list of all the medicines, herbs, non-prescription drugs, or dietary supplements you use. Also tell them if you smoke, drink alcohol, or use illegal drugs. Some items may interact with your medicine. What should I watch for while using this medicine? Your condition will be monitored carefully while you are receiving this medicine. What side effects may I notice from receiving this medicine? Side effects that you should report to your doctor or health care professional as soon as possible: -allergic reactions like skin rash, itching or hives, swelling of the face, lips, or tongue -breathing problems -confusion -dizziness -fast, irregular heartbeat -fever and chills -loss of balance or coordination -seizures -sweating -swelling of the hands and feet -tremors -unusually weak or tired Side effects that usually do not require medical attention (report to your doctor or health care professional if they continue or are bothersome): -constipation or diarrhea -headache This list may not describe all possible side effects. Call your doctor for medical advice about side effects. You may report side effects to FDA at 1-800-FDA-1088. Where should I keep my medicine? This drug is given in a hospital or clinic and will not be stored at home. NOTE: This sheet is a summary. It may not cover all possible information. If you have questions about this medicine, talk to your doctor, pharmacist, or health care provider.    2016, Elsevier/Gold Standard. (2013-06-02 10:38:36)

## 2015-12-24 NOTE — Progress Notes (Signed)
ID: Christy Harrell   DOB: 1941/04/15  MR#: [id number]  ONG#:[id number]  PCP: [name], MD GYN:  SU:  OTHER MD:  Alysia Penna, Frederik Pear, Faustino Congress  CC: Breast cancer stage IV  TREATMENT: exemestane, everolimus, fulvestrant, and denosumab  BREAST CANCER HISTORY: From the original intake note:  The patient had routine screening mammography 10/16/2010 showing a lobulated mass in the right subareolar region measuring up to 5.5 cm. The Left breast showed some central microcalcifications. Biopsy of the Right breast mass on 10/28/2010 showed an invasive ductal carcinoma, grade 2, estrogen receptor positive (Allred score 8), progesterone receptor positive (Allred score 5), with an equivocal HER-2. The Left breast area of microcalcifications was biopsied at the same time, and was read as suspicious for DCIS.  On 11/17/2010 the patient underwent Right lumpectomy and axillary lymph node dissection for what proved to be a 3 cm invasive ductal carcinoma, grade 2, with some papillary and mucinous features. One of 16 lymph nodes was involved. FISH showed no HER-2 amplification. Left breast biopsy was benign.  The patient had an Oncotype sent, with a score of 27, predicting a risk of distant recurrence after 5 years of tamoxifen in the 18% range. With this intermediate result, the decision was made not to proceed with chemotherapy, since the patient is the sole caregiver to her very elderly mother. Instead the patient proceeded to radiation treatment which was completed 03/06/2011. She started anastrozole at that point. Her subsequent history is as detailed below   INTERVAL HISTORY: Zayra returns today for follow up of her metastatic breast cancer. She is due to start cycle 1 of CMF, to be given every 21 days.   REVIEW OF SYSTEMS: Ireta is stable to day. She denies fevers, chills, nausea, or vomiting. She is on amitiza for chronic constipation.  She is not wearing her compression sleeve so her right arm is much larger than usual. Her appetite is fair. She has recently has teeth removed from the right lower mouth and there is still some occasional pain there. She is anxious about starting chemo. A detailed review of systems is otherwise stable.   PAST MEDICAL HISTORY: Past Medical History  Diagnosis Date  . Club foot   . Arthritis   . Thyroid disease   . Hyperlipidemia   . Osteoporosis   . Hypertension   . GERD (gastroesophageal reflux disease)     watches diet  . Neuromuscular disorder (HCC)     lt arm numb sometimes  . Wears glasses   . History of radiation therapy 02/22/13- 03/13/13    left proximal humerus 3500 cGy 14 sessions  . Lymphedema     right arm  . Diabetes mellitus     metformin  . Cancer [facility])     right breast  . 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 Metastasis from malignant tumor of breast [facility])     liver    PAST SURGICAL HISTORY: Past Surgical History  Procedure Laterality Date  . Cesarean section    . Thyroidectomy  2002  . Foot surgery      as a child for club foot  . Breast surgery  2012    rt lump-16 nodes-in [place]  . Cesarean section    . Colonoscopy    . Dilation and curettage of uterus    . Humerus im nail Left 12/27/2012    Procedure: INTRAMEDULLARY (IM) NAIL HUMERAL LEFT PATHOLOGIC  FRACTURE;  Surgeon: Nita Sells, MD;  Location: [place];  Service: Orthopedics;  Laterality: Left;    FAMILY HISTORY Family History  Problem Relation Age of Onset  . Cancer Neg Hx   . Arthritis Neg Hx   . Heart disease Neg Hx   . Hyperlipidemia Neg Hx   . Hypertension Neg Hx   . Dementia Mother   . Hearing loss Mother    The patient's father died from unknown causes. The patient's mother is alive at age 59. The patient was a single child. There is no history of breast or ovarian cancer in the family to her knowledge.  GYNECOLOGIC  HISTORY: Menarche age 75, menopause in her early 73s. The patient is GX P1, first pregnancy to term age 33. She did not take hormone replacement  SOCIAL HISTORY: The patient grew up in [place], attended [employer] school, then moved to [place] where she lived about 40 years. 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 Her mother, [name] away at 100, on Sep 10, 2014. The patient's daughter [name], 60, is disabled secondary to pulmonary hypertension. She can be reached at [phone]. The patient has 2 grandchildren, [name] who works at a Scientist, physiological business and is 75 years old, in general, 24, completing high school.   ADVANCED DIRECTIVES:not in place  HEALTH MAINTENANCE: Social History  Substance Use Topics  . Smoking status: Former Smoker    Quit date: 12/23/1974  . Smokeless tobacco: Never Used  . Alcohol Use: No     Colonoscopy:  PAP:    Bone density:  2012, on ibandronate chronically  Lipid panel: UTD  Allergies  Allergen Reactions  . Bydureon [Exenatide] Rash  . Gadolinium Derivatives Other (See Comments)    Pt began having chest numbness/tingling , stated her heart felt funny, had to take her to ED for EKG per RN   CAP  . Enalapril Rash    Current Outpatient Prescriptions  Medication Sig Dispense Refill  . aspirin EC 81 MG tablet Take 1 tablet (81 mg total) by mouth daily.    Marland Kitchen atorvastatin (LIPITOR) 80 MG tablet Take 1 tablet (80 mg total) by mouth daily. 30 tablet 11  . docusate sodium (COLACE) 100 MG capsule Take 1 capsule (100 mg total) by mouth 2 (two) times daily as needed. Reported on 10/17/2015 10 capsule 0  . doxepin (SINEQUAN) 25 MG capsule Take 2 capsules (50 mg total) by mouth at bedtime as needed. 180 capsule 1  . fentaNYL (DURAGESIC - DOSED MCG/HR) 25 MCG/HR patch Place 1 patch (25 mcg total) onto the skin every 3 (three) days. 5 patch 0  . fluocinonide-emollient (LIDEX-E) 0.05 % cream Apply 1 application  topically 2 (two) times daily. 60 g 2  . furosemide (LASIX) 40 MG tablet Take 1 tablet (40 mg total) by mouth daily. 90 tablet 2  . ketoconazole (NIZORAL) 2 % cream Apply 1 application topically daily. 60 g 2  . levothyroxine (SYNTHROID) 175 MCG tablet Take 1 tablet (175 mcg total) by mouth daily before breakfast. 90 tablet 1  . lidocaine-prilocaine (EMLA) cream Apply to affected area once 30 g 3  . lubiprostone (AMITIZA) 24 MCG capsule Take 1 capsule (24 mcg total) by mouth 2 (two) times daily with a meal. 180 capsule 1  . magic mouthwash SOLN Take 5 mLs by mouth 4 (four) times daily as needed for mouth pain. 240 mL 3  . metFORMIN (GLUCOPHAGE) 500 MG tablet  Take 1 tablet (500 mg total) by mouth daily. 90 tablet 1  . metoCLOPramide (REGLAN) 5 MG tablet Take 1 tablet (5 mg total) by mouth every 8 (eight) hours as needed for nausea. 90 tablet 12  . ondansetron (ZOFRAN) 8 MG tablet Take twice a day starting the evenng of the day of chemo and continuing 2 full days 40 tablet 0  . oxyCODONE (OXY IR/ROXICODONE) 5 MG immediate release tablet Take 1 tablet (5 mg total) by mouth every 4 (four) hours as needed for severe pain. Take 1-2 tablets every 4-6 hours as needed for pain - fill on or after 12/03/15 100 tablet 0  . Polyethyl Glycol-Propyl Glycol (SYSTANE ULTRA OP) Place 1 drop into both eyes daily.    . polyethylene glycol powder (GLYCOLAX/MIRALAX) powder Take 255 g (1 Container total) by mouth daily. 255 g 3  . potassium chloride SA (K-DUR,KLOR-CON) 20 MEQ tablet Take 1 tablet (20 mEq total) by mouth 2 (two) times daily. 180 tablet 1  . pregabalin (LYRICA) 75 MG capsule Take 1 capsule (75 mg total) by mouth 2 (two) times daily. Not taking 60 capsule 5  . prochlorperazine (COMPAZINE) 10 MG tablet Take one tablet three times a day before meals tarting the evening of chemo and continuing for an additional two days 30 tablet 0  . clotrimazole-betamethasone (LOTRISONE) cream Apply 1 application topically 2  (two) times daily. (Patient not taking: Reported on 12/24/2015) 30 g 0  . LORazepam (ATIVAN) 0.5 MG tablet Take 15 minutes before MRI test; may repeat x 1 (Patient not taking: Reported on 12/24/2015) 10 tablet 0   No current facility-administered medications for this visit.   Facility-Administered Medications Ordered in Other Visits  Medication Dose Route Frequency Provider Last Rate Last Dose  . fluorouracil (ADRUCIL) chemo injection 1,100 mg  600 mg/m2 (Treatment Plan Actual) Intravenous Once Chauncey Cruel, MD      . heparin lock flush 100 unit/mL  500 Units Intracatheter Once PRN Chauncey Cruel, MD      . methotrexate (PF) chemo injection 72 mg  40 mg/m2 (Treatment Plan Actual) Intravenous Once Chauncey Cruel, MD      . sodium chloride flush (NS) 0.9 % injection 10 mL  10 mL Intracatheter PRN Chauncey Cruel, MD       PHYSICAL EXAM: Elderly African American woman who appears stated age 59 Vitals:   12/24/15 1031  BP: 134/65  Pulse: 95  Temp: 99 F (37.2 C)  Resp: 19   Body mass index is 33.45 kg/(m^2).    ECOG FS: 2 Filed Weights   12/24/15 1031  Weight: 171 lb 4.8 oz (77.701 kg)   Skin: rash consisting of erythematous pustules to right cheek and along chin under bottom lip HEENT: sclerae anicteric, conjunctivae pink, oropharynx clear. No thrush or mucositis.  Lymph Nodes: No cervical or supraclavicular lymphadenopathy  Lungs: clear to auscultation bilaterally, no rales, wheezes, or rhonci  Heart: regular rate and rhythm  Abdomen: round, soft, non tender, positive bowel sounds  Musculoskeletal: No focal spinal tenderness, grade 3 right upper extremity lymphedema Neuro: non focal, well oriented, positive affect  Breasts: deferred  Skin: warm, dry  HEENT: sclerae anicteric, conjunctivae pink, oropharynx clear. No thrush or mucositis.  Lymph Nodes: No cervical or supraclavicular lymphadenopathy  Lungs: clear to auscultation bilaterally, no rales, wheezes, or rhonci   Heart: regular rate and rhythm  Abdomen: round, soft, non tender, positive bowel sounds  Musculoskeletal: No focal spinal tenderness, grade 3 right  upper extremity lymphedema   Neuro: non focal, well oriented, positive affect  Breasts: deferred  LAB RESULTS: Lab Results  Component Value Date   WBC 5.7 12/24/2015   NEUTROABS 3.3 12/24/2015   HGB 10.1* 12/24/2015   HCT 32.6* 12/24/2015   MCV 84.2 12/24/2015   PLT 287 12/24/2015      Chemistry      Component Value Date/Time   NA 141 12/24/2015 1013   NA 141 12/17/2015 1313   K 4.3 12/24/2015 1013   K 3.9 12/17/2015 1313   CL 104 12/17/2015 1313   CL 105 01/23/2013 1337   CO2 30* 12/24/2015 1013   CO2 28 12/17/2015 1313   BUN 10.3 12/24/2015 1013   BUN 10 12/17/2015 1313   CREATININE 0.8 12/24/2015 1013   CREATININE 0.78 12/17/2015 1313      Component Value Date/Time   CALCIUM 9.4 12/24/2015 1013   CALCIUM 9.4 12/17/2015 1313   ALKPHOS 66 12/24/2015 1013   ALKPHOS 77 12/03/2015 1402   AST 20 12/24/2015 1013   AST 19 12/03/2015 1402   ALT 9 12/24/2015 1013   ALT 7 12/03/2015 1402   BILITOT <0.30 12/24/2015 1013   BILITOT 0.5 12/03/2015 1402       Lab Results  Component Value Date   LABCA2 24 11/19/2011    STUDIES: Dg Sinuses Complete  12/03/2015  CLINICAL DATA:  Pain. Soreness. Prior tooth removal right lower jaw. EXAM: PARANASAL SINUSES - COMPLETE 3 + VIEW COMPARISON:  MRI 10/04/2015. FINDINGS: Minimal mucosal thickening maxillary sinuses. Mastoids are clear. No acute bony abnormality. For further dental evaluation Panorex views can be obtained as needed . IMPRESSION: 1. Minimal mucosal thickening maxillary sinuses, mild changes of chronic sinusitis cannot be excluded . 2.  No acute abnormality. Electronically Signed   By: Marcello Moores  Register   On: 12/03/2015 14:33   Mr Liver Wo Contrast  12/04/2015  CLINICAL DATA:  Metastatic right breast cancer EXAM: MRI ABDOMEN WITHOUT CONTRAST TECHNIQUE: Multiplanar  multisequence MR imaging was performed without the administration of intravenous contrast. COMPARISON:  MRI abdomen dated 07/12/2015 FINDINGS: Motion degraded images. Lower chest:  Lung bases are clear. Suspected postoperative seroma in the medial right breast (series 3/ image 6). Overlying skin thickening with subcutaneous stranding, favored to reflect radiation changes. Hepatobiliary: Multifocal hepatic metastases, likely progressed from the prior. For example: --5.5 x 4.9 cm lesion in segment 2 (series 3/image 12), previously 6.1 x 4.2 cm --3.6 x 5.1 cm lesion in segment 6 (series 3/image 18), previously 3.2 x 4.0 cm --2.4 cm lesion in segment 7 (series 3/image 13), previously 2.0 cm --12 mm lesion in segment 7 (series 3/image 10), previously 6 mm --12 mm lesion in segment 4A (series 3/image 10), unchanged --13 mm lesion in segment 4B (series 3/image 16), previously 10 mm Gallbladder is unremarkable. No intrahepatic or extrahepatic ductal dilatation. Pancreas: Within normal limits. Spleen: Within normal limits. Adrenals/Urinary Tract: Adrenal glands are within normal limits. Kidneys are within normal limits.  No hydronephrosis. Stomach/Bowel: Stomach and visualized bowel are unremarkable. Vascular/Lymphatic: No evidence of abdominal aortic aneurysm. No suspicious abdominal lymphadenopathy. Other: No abdominal ascites. Musculoskeletal: No focal osseous lesions. IMPRESSION: Mild progression of multifocal hepatic metastases, as above. Postsurgical/postradiation changes in the right breast. Electronically Signed   By: Julian Hy M.D.   On: 12/04/2015 14:09   Ir Fluoro Guide Cv Line Left  12/17/2015  INDICATION: 76 year old female with a history of right breast cancer. EXAM: IMPLANTED PORT A CATH PLACEMENT WITH ULTRASOUND AND  FLUOROSCOPIC GUIDANCE MEDICATIONS: 2.0 g Ancef; The antibiotic was administered within an appropriate time interval prior to skin puncture. ANESTHESIA/SEDATION: Moderate (conscious)  sedation was employed during this procedure. A total of Versed 3.0 mg and Fentanyl 50 mcg was administered intravenously. Moderate Sedation Time: 26 minutes. The patient's level of consciousness and vital signs were monitored continuously by radiology nursing throughout the procedure under my direct supervision. FLUOROSCOPY TIME:  Zero minutes, 24 seconds (6 mGy) COMPLICATIONS: None PROCEDURE: The procedure, risks, benefits, and alternatives were explained to the patient. Questions regarding the procedure were encouraged and answered. The patient understands and consents to the procedure. Ultrasound survey was performed with images stored and sent to PACs. The left neck and chest was prepped with chlorhexidine, and draped in the usual sterile fashion using maximum barrier technique (cap and mask, sterile gown, sterile gloves, large sterile sheet, hand hygiene and cutaneous antiseptic). Antibiotic prophylaxis was provided with 2.0g Ancef administered IV one hour prior to skin incision. Local anesthesia was attained by infiltration with 1% lidocaine without epinephrine. Ultrasound demonstrated patency of the left internal jugular vein, and this was documented with an image. Under real-time ultrasound guidance, this vein was accessed with a 21 gauge micropuncture needle and image documentation was performed. A small dermatotomy was made at the access site with an 11 scalpel. A 0.018" wire was advanced into the SVC and used to estimate the length of the internal catheter. The access needle exchanged for a 42F micropuncture vascular sheath. The 0.018" wire was then removed and a 0.035" wire advanced into the IVC. An appropriate location for the subcutaneous reservoir was selected below the clavicle and an incision was made through the skin and underlying soft tissues. The subcutaneous tissues were then dissected using a combination of blunt and sharp surgical technique and a pocket was formed. A single lumen power  injectable portacatheter was then tunneled through the subcutaneous tissues from the pocket to the dermatotomy and the port reservoir placed within the subcutaneous pocket. The venous access site was then serially dilated and a peel away vascular sheath placed over the wire. The wire was removed and the port catheter advanced into position under fluoroscopic guidance. The catheter tip is positioned in the cavoatrial junction. This was documented with a spot image. The portacatheter was then tested and found to flush and aspirate well. The port was flushed with saline followed by 100 units/mL heparinized saline. The pocket was then closed in two layers using first subdermal inverted interrupted absorbable sutures followed by a running subcuticular suture. The epidermis was then sealed with Dermabond. The dermatotomy at the venous access site was also seal with Dermabond. Patient tolerated the procedure well and remained hemodynamically stable throughout. No complications encountered and no significant blood loss encountered IMPRESSION: Status post left IJ port catheter placement. Catheter ready for use. Signed, Dulcy Fanny. Earleen Newport, DO [facility] Electronically Signed   By: Corrie Mckusick D.O.   On: 12/17/2015 17:35   Ir US Guide Vasc Access Left  12/17/2015  INDICATION: 75 year old female with a history of right breast cancer. EXAM: IMPLANTED PORT A CATH PLACEMENT WITH ULTRASOUND AND FLUOROSCOPIC GUIDANCE MEDICATIONS: 2.0 g Ancef; The antibiotic was administered within an appropriate time interval prior to skin puncture. ANESTHESIA/SEDATION: Moderate (conscious) sedation was employed during this procedure. A total of Versed 3.0 mg and Fentanyl 50 mcg was administered intravenously. Moderate Sedation Time: 26 minutes. The patient's level of consciousness and vital signs were monitored continuously by radiology  nursing throughout the procedure under my direct  supervision. FLUOROSCOPY TIME:  Zero minutes, 24 seconds (6 mGy) COMPLICATIONS: None PROCEDURE: The procedure, risks, benefits, and alternatives were explained to the patient. Questions regarding the procedure were encouraged and answered. The patient understands and consents to the procedure. Ultrasound survey was performed with images stored and sent to PACs. The left neck and chest was prepped with chlorhexidine, and draped in the usual sterile fashion using maximum barrier technique (cap and mask, sterile gown, sterile gloves, large sterile sheet, hand hygiene and cutaneous antiseptic). Antibiotic prophylaxis was provided with 2.0g Ancef administered IV one hour prior to skin incision. Local anesthesia was attained by infiltration with 1% lidocaine without epinephrine. Ultrasound demonstrated patency of the left internal jugular vein, and this was documented with an image. Under real-time ultrasound guidance, this vein was accessed with a 21 gauge micropuncture needle and image documentation was performed. A small dermatotomy was made at the access site with an 11 scalpel. A 0.018" wire was advanced into the SVC and used to estimate the length of the internal catheter. The access needle exchanged for a 47F micropuncture vascular sheath. The 0.018" wire was then removed and a 0.035" wire advanced into the IVC. An appropriate location for the subcutaneous reservoir was selected below the clavicle and an incision was made through the skin and underlying soft tissues. The subcutaneous tissues were then dissected using a combination of blunt and sharp surgical technique and a pocket was formed. A single lumen power injectable portacatheter was then tunneled through the subcutaneous tissues from the pocket to the dermatotomy and the port reservoir placed within the subcutaneous pocket. The venous access site was then serially dilated and a peel away vascular sheath placed over the wire. The wire was removed and the  port catheter advanced into position under fluoroscopic guidance. The catheter tip is positioned in the cavoatrial junction. This was documented with a spot image. The portacatheter was then tested and found to flush and aspirate well. The port was flushed with saline followed by 100 units/mL heparinized saline. The pocket was then closed in two layers using first subdermal inverted interrupted absorbable sutures followed by a running subcuticular suture. The epidermis was then sealed with Dermabond. The dermatotomy at the venous access site was also seal with Dermabond. Patient tolerated the procedure well and remained hemodynamically stable throughout. No complications encountered and no significant blood loss encountered IMPRESSION: Status post left IJ port catheter placement. Catheter ready for use. Signed, Dulcy Fanny. Earleen Newport, DO [facility] Electronically Signed   By: Corrie Mckusick D.O.   On: 12/17/2015 17:35     ASSESSMENT: 75 y.o.  [place] woman with stage IV [bone only] breast cancer as of May 2014  (1)  status post right lumpectomy and axillary lymph node dissection 11/17/2010 for a pT2 pN1, stage IIB invasive ductal carcinoma, grade 2, estrogen and progesterone receptor positive, HER-2 negative,   (2) Oncotype DX recurrence score of 27 predicting a risk of distant recurrence of 18% with 5 years of tamoxifen (intermediate score);   (3)  status post radiation completed July of 2012,   (4) started anastrozole July 2012, discontinued June 2014 with evidence of metastatic spread to bone  (5) Chronic lymphedema in the right upper extremity.  (6) pathologic fracture of the left humerus along apparent lytic lesion, with no other bone lesions per bone scan 12/12/2012  (7) on 12/27/2012 underwent #1 intramedullary nail left pathologic proximal humerus fracture  #  2 left shoulder rotator cuff repair  #3 left shoulder open bone biopsy with  pathology showing metastatic breast adenocarcinoma, estrogen receptor 100% positive, progesterone receptor and HER-2 negative, with an MIB-1 of 26%. PET scan 01/13/2013 showed no other areas of disease   (8) status post 35 Gy to the left proximal humerus completed 03/13/2013(  (9) fulvestrant, started on 01/23/2013, stopped May 2017 with progression  (10) zolendronic acid given monthly, first dose 01/23/2013; changed to denosumab as of August 2015 because of access problems, being given every three months  (a) May 2017 dose help after several extractions  (11) multiple liver lesions noted on scans April 2015   (12) letrozole added May 2015, stopped 04/18/14 because of progression in her liver noted on a chest CT performed on 03/14/14  (13) everolimus and exemestane started 04/19/14, stopped May 2017  (a) MRI of the abdomen 07/12/15 showed stable disease  (b) repeat abdominal MRI 12/04/2015 shows progression  (14) to start CMF chemotherapy (cyclophosphamide, methotrexate, fluorouracil) 12/24/2015  PLAN:  Lauria is reluctant to begin chemotherapy, but is willing to give it a try. We reviewed the use of compazine for nausea. The labs were reviewed in detail and were stable. She will proceed with cycle 1 of CMF as planned today. We are going to try to avoid use of neulasta if possible.  Kelbie will return in 1 week for labs and a nadir visit. She understands and agrees with this plan. She knows the goal of treatment in her case is control. She has been encouraged to call with any issues that might arise before her next visit here.    Laurie Panda, NP  12/24/2015

## 2015-12-25 LAB — CANCER ANTIGEN 27.29: CA 27.29: 260.6 U/mL — ABNORMAL HIGH (ref 0.0–38.6)

## 2015-12-26 ENCOUNTER — Telehealth: Payer: Self-pay | Admitting: *Deleted

## 2015-12-26 ENCOUNTER — Encounter (HOSPITAL_COMMUNITY): Payer: Self-pay | Admitting: *Deleted

## 2015-12-26 ENCOUNTER — Telehealth: Payer: Self-pay

## 2015-12-26 ENCOUNTER — Emergency Department (HOSPITAL_COMMUNITY)
Admission: EM | Admit: 2015-12-26 | Discharge: 2015-12-26 | Disposition: A | Discharge status: Home / Self Care | Payer: Medicare Other | Attending: Emergency Medicine | Admitting: Emergency Medicine

## 2015-12-26 ENCOUNTER — Emergency Department (HOSPITAL_COMMUNITY): Payer: Medicare Other

## 2015-12-26 DIAGNOSIS — Z853 Personal history of malignant neoplasm of breast: Secondary | ICD-10-CM | POA: Diagnosis not present

## 2015-12-26 DIAGNOSIS — R519 Headache, unspecified: Secondary | ICD-10-CM

## 2015-12-26 DIAGNOSIS — Z7982 Long term (current) use of aspirin: Secondary | ICD-10-CM | POA: Insufficient documentation

## 2015-12-26 DIAGNOSIS — E119 Type 2 diabetes mellitus without complications: Secondary | ICD-10-CM | POA: Diagnosis not present

## 2015-12-26 DIAGNOSIS — G501 Atypical facial pain: Secondary | ICD-10-CM | POA: Insufficient documentation

## 2015-12-26 DIAGNOSIS — E785 Hyperlipidemia, unspecified: Secondary | ICD-10-CM | POA: Diagnosis not present

## 2015-12-26 DIAGNOSIS — E079 Disorder of thyroid, unspecified: Secondary | ICD-10-CM | POA: Diagnosis not present

## 2015-12-26 DIAGNOSIS — Z79899 Other long term (current) drug therapy: Secondary | ICD-10-CM | POA: Diagnosis not present

## 2015-12-26 DIAGNOSIS — Z8505 Personal history of malignant neoplasm of liver: Secondary | ICD-10-CM | POA: Diagnosis not present

## 2015-12-26 DIAGNOSIS — I10 Essential (primary) hypertension: Secondary | ICD-10-CM | POA: Diagnosis not present

## 2015-12-26 DIAGNOSIS — T7840XA Allergy, unspecified, initial encounter: Secondary | ICD-10-CM | POA: Insufficient documentation

## 2015-12-26 DIAGNOSIS — Z79891 Long term (current) use of opiate analgesic: Secondary | ICD-10-CM | POA: Diagnosis not present

## 2015-12-26 DIAGNOSIS — K219 Gastro-esophageal reflux disease without esophagitis: Secondary | ICD-10-CM | POA: Diagnosis not present

## 2015-12-26 DIAGNOSIS — M1990 Unspecified osteoarthritis, unspecified site: Secondary | ICD-10-CM | POA: Diagnosis not present

## 2015-12-26 DIAGNOSIS — M810 Age-related osteoporosis without current pathological fracture: Secondary | ICD-10-CM | POA: Insufficient documentation

## 2015-12-26 DIAGNOSIS — Z7984 Long term (current) use of oral hypoglycemic drugs: Secondary | ICD-10-CM | POA: Diagnosis not present

## 2015-12-26 DIAGNOSIS — Z87891 Personal history of nicotine dependence: Secondary | ICD-10-CM | POA: Diagnosis not present

## 2015-12-26 DIAGNOSIS — R51 Headache: Secondary | ICD-10-CM

## 2015-12-26 LAB — CBC WITH DIFFERENTIAL/PLATELET
BASOS ABS: 0 10*3/uL (ref 0.0–0.1)
BASOS PCT: 0 %
Eosinophils Absolute: 0.1 10*3/uL (ref 0.0–0.7)
Eosinophils Relative: 1 %
HEMATOCRIT: 30.6 % — AB (ref 36.0–46.0)
Hemoglobin: 9.6 g/dL — ABNORMAL LOW (ref 12.0–15.0)
LYMPHS PCT: 26 %
Lymphs Abs: 1.8 10*3/uL (ref 0.7–4.0)
MCH: 26.3 pg (ref 26.0–34.0)
MCHC: 31.4 g/dL (ref 30.0–36.0)
MCV: 83.8 fL (ref 78.0–100.0)
Monocytes Absolute: 0.4 10*3/uL (ref 0.1–1.0)
Monocytes Relative: 6 %
NEUTROS ABS: 4.5 10*3/uL (ref 1.7–7.7)
NEUTROS PCT: 67 %
Platelets: 318 10*3/uL (ref 150–400)
RBC: 3.65 MIL/uL — AB (ref 3.87–5.11)
RDW: 17.9 % — ABNORMAL HIGH (ref 11.5–15.5)
WBC: 6.8 10*3/uL (ref 4.0–10.5)

## 2015-12-26 LAB — I-STAT CHEM 8, ED
BUN: 13 mg/dL (ref 6–20)
CHLORIDE: 104 mmol/L (ref 101–111)
CREATININE: 0.6 mg/dL (ref 0.44–1.00)
Calcium, Ion: 1.11 mmol/L — ABNORMAL LOW (ref 1.13–1.30)
GLUCOSE: 94 mg/dL (ref 65–99)
HEMATOCRIT: 31 % — AB (ref 36.0–46.0)
Hemoglobin: 10.5 g/dL — ABNORMAL LOW (ref 12.0–15.0)
POTASSIUM: 3.7 mmol/L (ref 3.5–5.1)
Sodium: 142 mmol/L (ref 135–145)
TCO2: 27 mmol/L (ref 0–100)

## 2015-12-26 MED ORDER — HYDROMORPHONE HCL 1 MG/ML IJ SOLN
1.0000 mg | Freq: Once | INTRAMUSCULAR | Status: AC
  Administered 2015-12-26: 1 mg via INTRAVENOUS
  Filled 2015-12-26: qty 1

## 2015-12-26 MED ORDER — HEPARIN SOD (PORK) LOCK FLUSH 100 UNIT/ML IV SOLN
500.0000 [IU] | Freq: Once | INTRAVENOUS | Status: AC
  Administered 2015-12-26: 500 [IU]
  Filled 2015-12-26: qty 5

## 2015-12-26 MED ORDER — PENICILLIN V POTASSIUM 500 MG PO TABS: 1000.0000 mg | ORAL_TABLET | Freq: Two times a day (BID) | ORAL | Status: DC

## 2015-12-26 NOTE — ED Provider Notes (Signed)
CSN: [id number]     Arrival date & time 12/26/15  1653 History   First MD Initiated Contact with Patient 12/26/15 1721     Chief Complaint  Patient presents with  . facial burning    . Allergic Reaction     (Consider location/radiation/quality/duration/timing/severity/associated sxs/prior Treatment) HPI  75 year old female with a history of malignant, metastatic right breast cancer resents with acute right sided facial pain. She states that she had chemotherapy for the first time 2 days ago. Last night she developed sudden right-sided facial pain. The most maximal point of pain is just anterior to her right ear. She feels like if she could get something out of her ear she would feel better. No fevers. No vomiting. There is some sore throat as well. She's had history of having to have a right mandibular tooth extracted and states that the pain feels somewhat similar. She thinks that one of her mandibular teeth is starting to bother her over the last couple weeks. Pain somewhat worsened when trying to open her mouth. No neck stiffness but the pain does sometimes radiate into her neck on the right side. She feels like her right mandibular gum is somewhat swollen. No headache. Feels like her right face is swollen and right lip is burning.   Past Medical History  Diagnosis Date  . Club foot   . Arthritis   . Thyroid disease   . Hyperlipidemia   . Osteoporosis   . Hypertension   . GERD (gastroesophageal reflux disease)     watches diet  . Neuromuscular disorder (HCC)     lt arm numb sometimes  . Wears glasses   . History of radiation therapy 02/22/13- 03/13/13    left proximal humerus 3500 cGy 14 sessions  . Lymphedema     right arm  . Diabetes mellitus     metformin  . Cancer [facility])     right breast  . 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 Metastasis from malignant tumor of breast [facility])     liver   Past Surgical History  Procedure Laterality Date  .  Cesarean section    . Thyroidectomy  2002  . Foot surgery      as a child for club foot  . Breast surgery  2012    rt lump-16 nodes-in [place]  . Cesarean section    . Colonoscopy    . Dilation and curettage of uterus    . Humerus im nail Left 12/27/2012    Procedure: INTRAMEDULLARY (IM) NAIL HUMERAL LEFT PATHOLOGIC FRACTURE;  Surgeon: Nita Sells, MD;  Location: [place];  Service: Orthopedics;  Laterality: Left;   Family History  Problem Relation Age of Onset  . Cancer Neg Hx   . Arthritis Neg Hx   . Heart disease Neg Hx   . Hyperlipidemia Neg Hx   . Hypertension Neg Hx   . Dementia Mother   . Hearing loss Mother    Social History  Substance Use Topics  . Smoking status: Former Smoker    Quit date: 12/23/1974  . Smokeless tobacco: Never Used  . Alcohol Use: No   OB History    No data available     Review of Systems  Constitutional: Negative for fever.  HENT: Positive for ear pain, facial swelling and sore throat. Negative for trouble swallowing.   Respiratory: Negative for shortness of breath.   Gastrointestinal: Negative for vomiting.  Neurological:  Negative for headaches.  All other systems reviewed and are negative.     Allergies  Bydureon; Gadolinium derivatives; and Enalapril  Home Medications   Prior to Admission medications   Medication Sig Start Date End Date Taking? Authorizing Provider  aspirin EC 81 MG tablet Take 1 tablet (81 mg total) by mouth daily. 01/22/14   Sueanne Margarita, MD  atorvastatin (LIPITOR) 80 MG tablet Take 1 tablet (80 mg total) by mouth daily. 02/20/15   Sueanne Margarita, MD  clotrimazole-betamethasone (LOTRISONE) cream Apply 1 application topically 2 (two) times daily. Patient not taking: Reported on 12/24/2015 11/29/15   Trula Slade, DPM  docusate sodium (COLACE) 100 MG capsule Take 1 capsule (100 mg total) by mouth 2 (two) times daily as needed. Reported on 10/17/2015 12/03/15   Janith Lima, MD  doxepin  (SINEQUAN) 25 MG capsule Take 2 capsules (50 mg total) by mouth at bedtime as needed. 09/09/15   Janith Lima, MD  fentaNYL (DURAGESIC - DOSED MCG/HR) 25 MCG/HR patch Place 1 patch (25 mcg total) onto the skin every 3 (three) days. 12/03/15   Janith Lima, MD  fluocinonide-emollient (LIDEX-E) 0.05 % cream Apply 1 application topically 2 (two) times daily. 09/10/15   Janith Lima, MD  furosemide (LASIX) 40 MG tablet Take 1 tablet (40 mg total) by mouth daily. 11/27/15   Janith Lima, MD  ketoconazole (NIZORAL) 2 % cream Apply 1 application topically daily. 05/03/15   Trula Slade, DPM  levothyroxine (SYNTHROID) 175 MCG tablet Take 1 tablet (175 mcg total) by mouth daily before breakfast. 09/10/15   Janith Lima, MD  lidocaine-prilocaine (EMLA) cream Apply to affected area once 12/11/15   Chauncey Cruel, MD  LORazepam (ATIVAN) 0.5 MG tablet Take 15 minutes before MRI test; may repeat x 1 Patient not taking: Reported on 12/24/2015 11/21/14   Laurie Panda, NP  lubiprostone (AMITIZA) 24 MCG capsule Take 1 capsule (24 mcg total) by mouth 2 (two) times daily with a meal. 12/03/15   Janith Lima, MD  magic mouthwash SOLN Take 5 mLs by mouth 4 (four) times daily as needed for mouth pain. 12/11/15   Chauncey Cruel, MD  metFORMIN (GLUCOPHAGE) 500 MG tablet Take 1 tablet (500 mg total) by mouth daily. 11/27/15   Janith Lima, MD  metoCLOPramide (REGLAN) 5 MG tablet Take 1 tablet (5 mg total) by mouth every 8 (eight) hours as needed for nausea. 09/19/15   Chauncey Cruel, MD  ondansetron (ZOFRAN) 8 MG tablet Take twice a day starting the evenng of the day of chemo and continuing 2 full days 12/11/15   Chauncey Cruel, MD  oxyCODONE (OXY IR/ROXICODONE) 5 MG immediate release tablet Take 1 tablet (5 mg total) by mouth every 4 (four) hours as needed for severe pain. Take 1-2 tablets every 4-6 hours as needed for pain - fill on or after 12/03/15 12/03/15   Janith Lima, MD  Polyethyl Glycol-Propyl  Glycol (SYSTANE ULTRA OP) Place 1 drop into both eyes daily.    Historical Provider, MD  polyethylene glycol powder (GLYCOLAX/MIRALAX) powder Take 255 g (1 Container total) by mouth daily. 03/27/14   Irene Shipper, MD  potassium chloride SA (K-DUR,KLOR-CON) 20 MEQ tablet Take 1 tablet (20 mEq total) by mouth 2 (two) times daily. 11/27/15   Janith Lima, MD  pregabalin (LYRICA) 75 MG capsule Take 1 capsule (75 mg total) by mouth 2 (two) times daily. Not taking  12/03/15   Janith Lima, MD  prochlorperazine (COMPAZINE) 10 MG tablet Take one tablet three times a day before meals tarting the evening of chemo and continuing for an additional two days 12/11/15   Chauncey Cruel, MD   There were no vitals taken for this visit. Physical Exam  Constitutional: She is oriented to person, place, and time. She appears well-developed and well-nourished.  HENT:  Head: Normocephalic and atraumatic.  Right Ear: Tympanic membrane, external ear and ear canal normal.  Left Ear: Tympanic membrane, external ear and ear canal normal.  Nose: Nose normal.  No appreciable right facial swelling. Mild tenderness over right mandible. Multiple missing teeth on right. Multiple broken and blackened maxillary teeth. Right mid-mandible with some mild gingival swelling but no obvious abscess. Tenderness over premolar.  Eyes: Right eye exhibits no discharge. Left eye exhibits no discharge.  Neck: Normal range of motion. Neck supple.  Cardiovascular: Normal rate, regular rhythm and normal heart sounds.   Pulmonary/Chest: Effort normal and breath sounds normal. No stridor. She has no wheezes.  Abdominal: Soft. There is no tenderness.  Musculoskeletal:  Severe right arm lymphedema, unchanged per patient  Neurological: She is alert and oriented to person, place, and time.  No facial droop. Normal sensation around lips and over bilateral face  Skin: Skin is warm and dry.  Nursing note and vitals reviewed.   ED Course  Procedures  (including critical care time) Labs Review Labs Reviewed  CBC WITH DIFFERENTIAL/PLATELET - Abnormal; Notable for the following:    RBC 3.65 (*)    Hemoglobin 9.6 (*)    HCT 30.6 (*)    RDW 17.9 (*)    All other components within normal limits  I-STAT CHEM 8, ED - Abnormal; Notable for the following:    Calcium, Ion 1.11 (*)    Hemoglobin 10.5 (*)    HCT 31.0 (*)    All other components within normal limits    Imaging Review Ct Soft Tissue Neck Wo Contrast  12/26/2015  CLINICAL DATA:  Right jaw/ face oral pain up to the ear and throat pain. Burning and stinging sensation in the lips. Recently began chemotherapy for breast cancer. EXAM: CT NECK WITHOUT CONTRAST TECHNIQUE: Multidetector CT imaging of the neck was performed following the standard protocol without intravenous contrast. COMPARISON:  PET-CT 03/07/2015 FINDINGS: Pharynx and larynx: No mass identified on this unenhanced study. Chronic asymmetry of the piriform sinuses. Unremarkable larynx. No parapharyngeal or retropharyngeal inflammatory change. No significant subcutaneous inflammatory change is seen in the face or neck. Salivary glands: Parotid and submandibular glands are unremarkable. Thyroid: Prior thyroidectomy. Lymph nodes: No enlarged lymph nodes identified in the neck. Vascular: Mild-to-moderate calcified plaque at the carotid bifurcations. Limited intracranial: Unremarkable. Visualized orbits: Unremarkable. Mastoids and visualized paranasal sinuses: Mild bilateral maxillary sinus mucosal thickening. Clear mastoid air cells. Skeleton: No suspicious lytic or blastic osseous lesion. Advanced multilevel cervical facet arthrosis. Advanced disc degeneration at C5-6 greater than C6-7. Upper chest: Partially visualized left jugular Port-A-Cath. Clear lung apices. IMPRESSION: No acute abnormality or mass identified in the neck. Electronically Signed   By: Logan Bores M.D.   On: 12/26/2015 19:43   I have personally reviewed and evaluated  these images and lab results as part of my medical decision-making.   EKG Interpretation None      MDM   Final diagnoses:  Right facial pain    Unclear why patient is having facial pain. Could be infectious with overall poor dentition, especially on  right. But no cellulitis or abscess. Will cover for oral source/gingivitis with PCN. Possibly a side effect of chemo, such as a neuropathy? But no facial droop or numbness. Recommend she use her oral oxycodone for pain and d/c with antibiotics. F/u with oncology    Sherwood Gambler, MD 12/27/15 [phone]

## 2015-12-26 NOTE — Telephone Encounter (Signed)
-----   Message from [place], RN sent at 12/24/2015  2:13 PM EDT ----- Regarding: Dr. [name]. fu chemo phone call Dr. [name]. 1st time fu phone call. Cytoxan,methotrexate,65fu. Tolerated all chemo well.

## 2015-12-26 NOTE — ED Notes (Signed)
Pt reports receiving her first chemo Tuesday, started to have burning sensation in R side of her face the next day which was yesterday.  Pt reports the burning and stinging sensation is in her lips, but the R side of her face feels achy and swollen.

## 2015-12-26 NOTE — Telephone Encounter (Signed)
Pt c/o right jaw pain, right ear pain and lower lip pain since Tuesday.  States that she had pain before chemo but it's worse now. Transferred the call to Dr Virgie Dad nurse.

## 2015-12-26 NOTE — Telephone Encounter (Signed)
This RN returned call to pt per her VM stating " I am having problems "  Per phone discussion pt states on set of right ear, mouth, lip and throat pain " like when I had that dental issue but that was a while back ". Discomfort started pm last night and is continuing.  She has used cool compresses, magic mouth wash, tylenol and just a while ago took oxyIR.  Patryce states " I am here by myself and don't have a car - but later when I think I can get a ride I might go to the ER - but I want it to be cleared out a little "  Plan is pt will continue to use the mouthwash and pain medication.  Appointment can be made for tomorrow here if pt is able to manage symptoms at home.

## 2015-12-27 ENCOUNTER — Telehealth: Payer: Self-pay

## 2015-12-27 NOTE — Telephone Encounter (Signed)
Patient calling this morning c/o right sided facial pain.  She states that she was in the ED last night and the MD thought she might have neuropathy that may be chemo induced or that she possibly has an infection coming from one of her teeth. Patient states that she is on penicillin and is taking oxycodone for the pain.  She states that she doesn't feel any better today then she did last night wh en she went in to the ED.  Writer encouraged patient to call her dentist and follow up with her as well as continue on the penicillin.  Patient stated understanding.

## 2015-12-31 ENCOUNTER — Encounter: Payer: Self-pay | Admitting: Nurse Practitioner

## 2015-12-31 ENCOUNTER — Other Ambulatory Visit (HOSPITAL_BASED_OUTPATIENT_CLINIC_OR_DEPARTMENT_OTHER): Payer: Medicare Other

## 2015-12-31 ENCOUNTER — Telehealth: Payer: Self-pay | Admitting: Nurse Practitioner

## 2015-12-31 ENCOUNTER — Ambulatory Visit (HOSPITAL_BASED_OUTPATIENT_CLINIC_OR_DEPARTMENT_OTHER): Payer: Medicare Other | Admitting: Nurse Practitioner

## 2015-12-31 VITALS — BP 126/58 | HR 85 | Temp 99.4°F | Resp 18 | Ht 60.0 in | Wt 169.8 lb

## 2015-12-31 DIAGNOSIS — R519 Headache, unspecified: Secondary | ICD-10-CM

## 2015-12-31 DIAGNOSIS — C7951 Secondary malignant neoplasm of bone: Secondary | ICD-10-CM | POA: Diagnosis not present

## 2015-12-31 DIAGNOSIS — C787 Secondary malignant neoplasm of liver and intrahepatic bile duct: Secondary | ICD-10-CM | POA: Diagnosis not present

## 2015-12-31 DIAGNOSIS — C50512 Malignant neoplasm of lower-outer quadrant of left female breast: Secondary | ICD-10-CM

## 2015-12-31 DIAGNOSIS — R51 Headache: Principal | ICD-10-CM

## 2015-12-31 DIAGNOSIS — R6884 Jaw pain: Secondary | ICD-10-CM | POA: Diagnosis not present

## 2015-12-31 DIAGNOSIS — I890 Lymphedema, not elsewhere classified: Secondary | ICD-10-CM

## 2015-12-31 LAB — CBC WITH DIFFERENTIAL/PLATELET
BASO%: 0.5 % (ref 0.0–2.0)
Basophils Absolute: 0 10*3/uL (ref 0.0–0.1)
EOS ABS: 0.1 10*3/uL (ref 0.0–0.5)
EOS%: 1.6 % (ref 0.0–7.0)
HCT: 30.3 % — ABNORMAL LOW (ref 34.8–46.6)
HGB: 9.8 g/dL — ABNORMAL LOW (ref 11.6–15.9)
LYMPH%: 20.2 % (ref 14.0–49.7)
MCH: 27.1 pg (ref 25.1–34.0)
MCHC: 32.3 g/dL (ref 31.5–36.0)
MCV: 83.7 fL (ref 79.5–101.0)
MONO#: 0.1 10*3/uL (ref 0.1–0.9)
MONO%: 2.3 % (ref 0.0–14.0)
NEUT%: 75.4 % (ref 38.4–76.8)
NEUTROS ABS: 3.3 10*3/uL (ref 1.5–6.5)
Platelets: 190 10*3/uL (ref 145–400)
RBC: 3.62 10*6/uL — AB (ref 3.70–5.45)
RDW: 16.9 % — ABNORMAL HIGH (ref 11.2–14.5)
WBC: 4.4 10*3/uL (ref 3.9–10.3)
lymph#: 0.9 10*3/uL (ref 0.9–3.3)

## 2015-12-31 LAB — COMPREHENSIVE METABOLIC PANEL
AST: 13 U/L (ref 5–34)
Albumin: 2.8 g/dL — ABNORMAL LOW (ref 3.5–5.0)
Alkaline Phosphatase: 67 U/L (ref 40–150)
Anion Gap: 7 mEq/L (ref 3–11)
BUN: 12.2 mg/dL (ref 7.0–26.0)
CHLORIDE: 105 meq/L (ref 98–109)
CO2: 27 meq/L (ref 22–29)
Calcium: 9.1 mg/dL (ref 8.4–10.4)
Creatinine: 0.7 mg/dL (ref 0.6–1.1)
GLUCOSE: 99 mg/dL (ref 70–140)
Potassium: 4.1 mEq/L (ref 3.5–5.1)
SODIUM: 139 meq/L (ref 136–145)
TOTAL PROTEIN: 6.8 g/dL (ref 6.4–8.3)

## 2015-12-31 MED ORDER — OXYCODONE HCL 5 MG PO TABS: 5.0000 mg | ORAL_TABLET | Freq: Four times a day (QID) | ORAL | Status: DC | PRN

## 2015-12-31 NOTE — Telephone Encounter (Signed)
appt made and vs printed

## 2015-12-31 NOTE — Progress Notes (Signed)
ID: Christy Harrell   DOB: [birth year]  MR#: [id number]  WLN#:[id number]  PCP: Christy Calico, MD GYN:  SU:  OTHER MD:  Christy Harrell, Christy Harrell, Christy Harrell  CC: Breast cancer stage IV  TREATMENT: exemestane, everolimus, fulvestrant, and denosumab  BREAST CANCER HISTORY: From the original intake note:  The patient had routine screening mammography 10/16/2010 showing a lobulated mass in the right subareolar region measuring up to 5.5 cm. The Left breast showed some central microcalcifications. Biopsy of the Right breast mass on 10/28/2010 showed an invasive ductal carcinoma, grade 2, estrogen receptor positive (Allred score 8), progesterone receptor positive (Allred score 5), with an equivocal HER-2. The Left breast area of microcalcifications was biopsied at the same time, and was read as suspicious for DCIS.  On 11/17/2010 the patient underwent Right lumpectomy and axillary lymph node dissection for what proved to be a 3 cm invasive ductal carcinoma, grade 2, with some papillary and mucinous features. One of 16 lymph nodes was involved. FISH showed no HER-2 amplification. Left breast biopsy was benign.  The patient had an Oncotype sent, with a score of 27, predicting a risk of distant recurrence after 5 years of tamoxifen in the 18% range. With this intermediate result, the decision was made not to proceed with chemotherapy, since the patient is the sole caregiver to her very elderly mother. Instead the patient proceeded to radiation treatment which was completed 03/06/2011. She started anastrozole at that point. Her subsequent history is as detailed below   INTERVAL HISTORY: Christy Harrell returns today for follow up of her metastatic breast cancer, accompanied by a friend. Today is day 8, cycle 1 of CMF, given every 21 days. The interval history is remarkable for a trip to the ED for acute right sided facial pain. She was reported as saying she has had  similar pain after having a right lower tooth removed earlier this year, but it is worse. She thinks another tooth may be acting up. That side of her face has sharp and burning pains. There were no rashes our mouth sores. She has no facial droop or numbness. She is going through lots of oxycodone, but it is hardly helpful. She was discharged from the ED with a prescription for penicillin to over her for an oral infection or gingivitis.   REVIEW OF SYSTEMS: Besides the above, Christy Harrell has had no fevers, chills, nausea, or vomiting. She is managing her constipation with amitiza and stool softeners. She denies neuropathy symptoms to her fingers or toes. Her energy is down a little but she is still moving well and sleeps well at night. She has chronic right upper arm lymphedema. A detailed review of systems is otherwise stable.   PAST MEDICAL HISTORY: Past Medical History  Diagnosis Date  . Club foot   . Arthritis   . Thyroid disease   . Hyperlipidemia   . Osteoporosis   . Hypertension   . GERD (gastroesophageal reflux disease)     watches diet  . Neuromuscular disorder (HCC)     lt arm numb sometimes  . Wears glasses   . History of radiation therapy 02/22/13- 03/13/13    left proximal humerus 3500 cGy 14 sessions  . Lymphedema     right arm  . Diabetes mellitus     metformin  . Cancer [facility])     right breast  . 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 Metastasis from malignant tumor  of breast ([place])     liver    PAST SURGICAL HISTORY: Past Surgical History  Procedure Laterality Date  . Cesarean section    . Thyroidectomy  2002  . Foot surgery      as a child for club foot  . Breast surgery  2012    rt lump-16 nodes-in [place]  . Cesarean section    . Colonoscopy    . Dilation and curettage of uterus    . Humerus im nail Left 12/27/2012    Procedure: INTRAMEDULLARY (IM) NAIL HUMERAL LEFT PATHOLOGIC FRACTURE;  Surgeon: Christy Sells, MD;   Location: [place];  Service: Orthopedics;  Laterality: Left;    FAMILY HISTORY Family History  Problem Relation Age of Onset  . Cancer Neg Hx   . Arthritis Neg Hx   . Heart disease Neg Hx   . Hyperlipidemia Neg Hx   . Hypertension Neg Hx   . Dementia Mother   . Hearing loss Mother    The patient's father died from unknown causes. The patient's mother is alive at age 61. The patient was a single child. There is no history of breast or ovarian cancer in the family to her knowledge.  GYNECOLOGIC HISTORY: Menarche age 66, menopause in her early 30s. The patient is GX P1, first pregnancy to term age 37. She did not take hormone replacement  SOCIAL HISTORY: The patient grew up in [place], attended [employer] school, then moved to [place] where she lived about 40 years. 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 Her mother, Christy Harrell away at 100, on 08-17-14. The patient's daughter Christy Harrell, 66, is disabled secondary to pulmonary hypertension. She can be reached at [phone]. The patient has 2 grandchildren, Christy Harrell who works at a Scientist, physiological business and is 75 years old, in general, 57, completing high school.   ADVANCED DIRECTIVES:not in place  HEALTH MAINTENANCE: Social History  Substance Use Topics  . Smoking status: Former Smoker    Quit date: 12/23/1974  . Smokeless tobacco: Never Used  . Alcohol Use: No     Colonoscopy:  PAP:    Bone density:  2012, on ibandronate chronically  Lipid panel: UTD  Allergies  Allergen Reactions  . Bydureon [Exenatide] Rash  . Gadolinium Derivatives Other (See Comments)    Pt began having chest numbness/tingling , stated her heart felt funny, had to take her to ED for EKG per RN   CAP  . Enalapril Rash    Current Outpatient Prescriptions  Medication Sig Dispense Refill  . aspirin EC 81 MG tablet Take 1 tablet (81 mg total) by mouth daily.    Marland Kitchen atorvastatin (LIPITOR) 80  MG tablet Take 1 tablet (80 mg total) by mouth daily. 30 tablet 11  . clotrimazole-betamethasone (LOTRISONE) cream Apply 1 application topically 2 (two) times daily. 30 g 0  . docusate sodium (COLACE) 100 MG capsule Take 1 capsule (100 mg total) by mouth 2 (two) times daily as needed. Reported on 10/17/2015 (Patient taking differently: Take 100 mg by mouth 2 (two) times daily as needed for mild constipation. Reported on 10/17/2015) 10 capsule 0  . doxepin (SINEQUAN) 25 MG capsule Take 2 capsules (50 mg total) by mouth at bedtime as needed. (Patient taking differently: Take 50 mg by mouth at bedtime as needed (sleep). ) 180 capsule 1  . fentaNYL (DURAGESIC - DOSED MCG/HR) 25 MCG/HR patch Place 1 patch (25 mcg total) onto the skin  every 3 (three) days. 5 patch 0  . fluocinonide-emollient (LIDEX-E) 0.05 % cream Apply 1 application topically 2 (two) times daily. 60 g 2  . furosemide (LASIX) 40 MG tablet Take 1 tablet (40 mg total) by mouth daily. 90 tablet 2  . ketoconazole (NIZORAL) 2 % cream Apply 1 application topically daily. 60 g 2  . levothyroxine (SYNTHROID) 175 MCG tablet Take 1 tablet (175 mcg total) by mouth daily before breakfast. 90 tablet 1  . lidocaine-prilocaine (EMLA) cream Apply to affected area once 30 g 3  . LORazepam (ATIVAN) 0.5 MG tablet Take 15 minutes before MRI test; may repeat x 1 10 tablet 0  . lubiprostone (AMITIZA) 24 MCG capsule Take 1 capsule (24 mcg total) by mouth 2 (two) times daily with a meal. (Patient taking differently: Take 24 mcg by mouth 2 (two) times daily as needed for constipation. ) 180 capsule 1  . magic mouthwash SOLN Take 5 mLs by mouth 4 (four) times daily as needed for mouth pain. 240 mL 3  . metFORMIN (GLUCOPHAGE) 500 MG tablet Take 1 tablet (500 mg total) by mouth daily. 90 tablet 1  . metoCLOPramide (REGLAN) 5 MG tablet Take 1 tablet (5 mg total) by mouth every 8 (eight) hours as needed for nausea. 90 tablet 12  . ondansetron (ZOFRAN) 8 MG tablet Take twice  a day starting the evenng of the day of chemo and continuing 2 full days 40 tablet 0  . oxyCODONE (OXY IR/ROXICODONE) 5 MG immediate release tablet Take 1-2 tablets (5-10 mg total) by mouth every 6 (six) hours as needed for severe pain. 100 tablet 0  . penicillin v potassium (VEETID) 500 MG tablet Take 2 tablets (1,000 mg total) by mouth 2 (two) times daily. X 7 days 28 tablet 0  . polyethylene glycol powder (GLYCOLAX/MIRALAX) powder Take 255 g (1 Container total) by mouth daily. 255 g 3  . potassium chloride SA (K-DUR,KLOR-CON) 20 MEQ tablet Take 1 tablet (20 mEq total) by mouth 2 (two) times daily. 180 tablet 1  . pregabalin (LYRICA) 75 MG capsule Take 1 capsule (75 mg total) by mouth 2 (two) times daily. Not taking (Patient taking differently: Take 75 mg by mouth 2 (two) times daily as needed (bone pain). Not taking) 60 capsule 5  . prochlorperazine (COMPAZINE) 10 MG tablet Take one tablet three times a day before meals tarting the evening of chemo and continuing for an additional two days 30 tablet 0   No current facility-administered medications for this visit.   PHYSICAL EXAM: Elderly African American woman who appears stated age 42 Vitals:   12/31/15 1351  BP: 126/58  Pulse: 85  Temp: 99.4 F (37.4 C)  Resp: 18   Body mass index is 33.16 kg/(m^2).    ECOG FS: 2 Filed Weights   12/31/15 1351  Weight: 169 lb 12.8 oz (77.021 kg)   Skin: warm, dry  HEENT: sclerae anicteric, conjunctivae pink, oropharynx clear. No thrush or mucositis. Dentition in poor repair. Missing bottom right teeth. Lymph Nodes: No cervical or supraclavicular lymphadenopathy  Lungs: clear to auscultation bilaterally, no rales, wheezes, or rhonci  Heart: regular rate and rhythm  Abdomen: round, soft, non tender, positive bowel sounds  Musculoskeletal: No focal spinal tenderness, grade 3 right upper extremity lymphedema   Neuro: non focal, well oriented, positive affect  Breasts: deferred  LAB RESULTS: Lab  Results  Component Value Date   WBC 4.4 12/31/2015   NEUTROABS 3.3 12/31/2015   HGB 9.8* 12/31/2015  HCT 30.3* 12/31/2015   MCV 83.7 12/31/2015   PLT 190 12/31/2015      Chemistry      Component Value Date/Time   NA 139 12/31/2015 1333   NA 142 12/26/2015 1841   K 4.1 12/31/2015 1333   K 3.7 12/26/2015 1841   CL 104 12/26/2015 1841   CL 105 01/23/2013 1337   CO2 27 12/31/2015 1333   CO2 28 12/17/2015 1313   BUN 12.2 12/31/2015 1333   BUN 13 12/26/2015 1841   CREATININE 0.7 12/31/2015 1333   CREATININE 0.60 12/26/2015 1841      Component Value Date/Time   CALCIUM 9.1 12/31/2015 1333   CALCIUM 9.4 12/17/2015 1313   ALKPHOS 67 12/31/2015 1333   ALKPHOS 77 12/03/2015 1402   AST 13 12/31/2015 1333   AST 19 12/03/2015 1402   ALT <9 12/31/2015 1333   ALT 7 12/03/2015 1402   BILITOT <0.30 12/31/2015 1333   BILITOT 0.5 12/03/2015 1402       Lab Results  Component Value Date   LABCA2 24 11/19/2011    STUDIES: Dg Sinuses Complete  12/03/2015  CLINICAL DATA:  Pain. Soreness. Prior tooth removal right lower jaw. EXAM: PARANASAL SINUSES - COMPLETE 3 + VIEW COMPARISON:  MRI 10/04/2015. FINDINGS: Minimal mucosal thickening maxillary sinuses. Mastoids are clear. No acute bony abnormality. For further dental evaluation Panorex views can be obtained as needed . IMPRESSION: 1. Minimal mucosal thickening maxillary sinuses, mild changes of chronic sinusitis cannot be excluded . 2.  No acute abnormality. Electronically Signed   By: Marcello Moores  Register   On: 12/03/2015 14:33   Ct Soft Tissue Neck Wo Contrast  12/26/2015  CLINICAL DATA:  Right jaw/ face oral pain up to the ear and throat pain. Burning and stinging sensation in the lips. Recently began chemotherapy for breast cancer. EXAM: CT NECK WITHOUT CONTRAST TECHNIQUE: Multidetector CT imaging of the neck was performed following the standard protocol without intravenous contrast. COMPARISON:  PET-CT 03/07/2015 FINDINGS: Pharynx and  larynx: No mass identified on this unenhanced study. Chronic asymmetry of the piriform sinuses. Unremarkable larynx. No parapharyngeal or retropharyngeal inflammatory change. No significant subcutaneous inflammatory change is seen in the face or neck. Salivary glands: Parotid and submandibular glands are unremarkable. Thyroid: Prior thyroidectomy. Lymph nodes: No enlarged lymph nodes identified in the neck. Vascular: Mild-to-moderate calcified plaque at the carotid bifurcations. Limited intracranial: Unremarkable. Visualized orbits: Unremarkable. Mastoids and visualized paranasal sinuses: Mild bilateral maxillary sinus mucosal thickening. Clear mastoid air cells. Skeleton: No suspicious lytic or blastic osseous lesion. Advanced multilevel cervical facet arthrosis. Advanced disc degeneration at C5-6 greater than C6-7. Upper chest: Partially visualized left jugular Port-A-Cath. Clear lung apices. IMPRESSION: No acute abnormality or mass identified in the neck. Electronically Signed   By: Logan Bores M.D.   On: 12/26/2015 19:43   Mr Liver Wo Contrast  12/04/2015  CLINICAL DATA:  Metastatic right breast cancer EXAM: MRI ABDOMEN WITHOUT CONTRAST TECHNIQUE: Multiplanar multisequence MR imaging was performed without the administration of intravenous contrast. COMPARISON:  MRI abdomen dated 07/12/2015 FINDINGS: Motion degraded images. Lower chest:  Lung bases are clear. Suspected postoperative seroma in the medial right breast (series 3/ image 6). Overlying skin thickening with subcutaneous stranding, favored to reflect radiation changes. Hepatobiliary: Multifocal hepatic metastases, likely progressed from the prior. For example: --5.5 x 4.9 cm lesion in segment 2 (series 3/image 12), previously 6.1 x 4.2 cm --3.6 x 5.1 cm lesion in segment 6 (series 3/image 18), previously 3.2 x 4.0 cm --  2.4 cm lesion in segment 7 (series 3/image 13), previously 2.0 cm --12 mm lesion in segment 7 (series 3/image 10), previously 6 mm  --12 mm lesion in segment 4A (series 3/image 10), unchanged --13 mm lesion in segment 4B (series 3/image 16), previously 10 mm Gallbladder is unremarkable. No intrahepatic or extrahepatic ductal dilatation. Pancreas: Within normal limits. Spleen: Within normal limits. Adrenals/Urinary Tract: Adrenal glands are within normal limits. Kidneys are within normal limits.  No hydronephrosis. Stomach/Bowel: Stomach and visualized bowel are unremarkable. Vascular/Lymphatic: No evidence of abdominal aortic aneurysm. No suspicious abdominal lymphadenopathy. Other: No abdominal ascites. Musculoskeletal: No focal osseous lesions. IMPRESSION: Mild progression of multifocal hepatic metastases, as above. Postsurgical/postradiation changes in the right breast. Electronically Signed   By: Julian Hy M.D.   On: 12/04/2015 14:09   Ir Fluoro Guide Cv Line Left  12/17/2015  INDICATION: 75 year old female with a history of right breast cancer. EXAM: IMPLANTED PORT A CATH PLACEMENT WITH ULTRASOUND AND FLUOROSCOPIC GUIDANCE MEDICATIONS: 2.0 g Ancef; The antibiotic was administered within an appropriate time interval prior to skin puncture. ANESTHESIA/SEDATION: Moderate (conscious) sedation was employed during this procedure. A total of Versed 3.0 mg and Fentanyl 50 mcg was administered intravenously. Moderate Sedation Time: 26 minutes. The patient's level of consciousness and vital signs were monitored continuously by radiology nursing throughout the procedure under my direct supervision. FLUOROSCOPY TIME:  Zero minutes, 24 seconds (6 mGy) COMPLICATIONS: None PROCEDURE: The procedure, risks, benefits, and alternatives were explained to the patient. Questions regarding the procedure were encouraged and answered. The patient understands and consents to the procedure. Ultrasound survey was performed with images stored and sent to PACs. The left neck and chest was prepped with chlorhexidine, and draped in the usual sterile fashion  using maximum barrier technique (cap and mask, sterile gown, sterile gloves, large sterile sheet, hand hygiene and cutaneous antiseptic). Antibiotic prophylaxis was provided with 2.0g Ancef administered IV one hour prior to skin incision. Local anesthesia was attained by infiltration with 1% lidocaine without epinephrine. Ultrasound demonstrated patency of the left internal jugular vein, and this was documented with an image. Under real-time ultrasound guidance, this vein was accessed with a 21 gauge micropuncture needle and image documentation was performed. A small dermatotomy was made at the access site with an 11 scalpel. A 0.018" wire was advanced into the SVC and used to estimate the length of the internal catheter. The access needle exchanged for a 30F micropuncture vascular sheath. The 0.018" wire was then removed and a 0.035" wire advanced into the IVC. An appropriate location for the subcutaneous reservoir was selected below the clavicle and an incision was made through the skin and underlying soft tissues. The subcutaneous tissues were then dissected using a combination of blunt and sharp surgical technique and a pocket was formed. A single lumen power injectable portacatheter was then tunneled through the subcutaneous tissues from the pocket to the dermatotomy and the port reservoir placed within the subcutaneous pocket. The venous access site was then serially dilated and a peel away vascular sheath placed over the wire. The wire was removed and the port catheter advanced into position under fluoroscopic guidance. The catheter tip is positioned in the cavoatrial junction. This was documented with a spot image. The portacatheter was then tested and found to flush and aspirate well. The port was flushed with saline followed by 100 units/mL heparinized saline. The pocket was then closed in two layers using first subdermal inverted interrupted absorbable sutures followed by a running subcuticular suture.  The  epidermis was then sealed with Dermabond. The dermatotomy at the venous access site was also seal with Dermabond. Patient tolerated the procedure well and remained hemodynamically stable throughout. No complications encountered and no significant blood loss encountered IMPRESSION: Status post left IJ port catheter placement. Catheter ready for use. Signed, Dulcy Fanny. Earleen Newport, DO [facility] Radiology Electronically Signed   By: Corrie Mckusick D.O.   On: 12/17/2015 17:35   Ir US Guide Vasc Access Left  12/17/2015  INDICATION: 75 year old female with a history of right breast cancer. EXAM: IMPLANTED PORT A CATH PLACEMENT WITH ULTRASOUND AND FLUOROSCOPIC GUIDANCE MEDICATIONS: 2.0 g Ancef; The antibiotic was administered within an appropriate time interval prior to skin puncture. ANESTHESIA/SEDATION: Moderate (conscious) sedation was employed during this procedure. A total of Versed 3.0 mg and Fentanyl 50 mcg was administered intravenously. Moderate Sedation Time: 26 minutes. The patient's level of consciousness and vital signs were monitored continuously by radiology nursing throughout the procedure under my direct supervision. FLUOROSCOPY TIME:  Zero minutes, 24 seconds (6 mGy) COMPLICATIONS: None PROCEDURE: The procedure, risks, benefits, and alternatives were explained to the patient. Questions regarding the procedure were encouraged and answered. The patient understands and consents to the procedure. Ultrasound survey was performed with images stored and sent to PACs. The left neck and chest was prepped with chlorhexidine, and draped in the usual sterile fashion using maximum barrier technique (cap and mask, sterile gown, sterile gloves, large sterile sheet, hand hygiene and cutaneous antiseptic). Antibiotic prophylaxis was provided with 2.0g Ancef administered IV one hour prior to skin incision. Local anesthesia was attained by infiltration with 1% lidocaine without  epinephrine. Ultrasound demonstrated patency of the left internal jugular vein, and this was documented with an image. Under real-time ultrasound guidance, this vein was accessed with a 21 gauge micropuncture needle and image documentation was performed. A small dermatotomy was made at the access site with an 11 scalpel. A 0.018" wire was advanced into the SVC and used to estimate the length of the internal catheter. The access needle exchanged for a 34F micropuncture vascular sheath. The 0.018" wire was then removed and a 0.035" wire advanced into the IVC. An appropriate location for the subcutaneous reservoir was selected below the clavicle and an incision was made through the skin and underlying soft tissues. The subcutaneous tissues were then dissected using a combination of blunt and sharp surgical technique and a pocket was formed. A single lumen power injectable portacatheter was then tunneled through the subcutaneous tissues from the pocket to the dermatotomy and the port reservoir placed within the subcutaneous pocket. The venous access site was then serially dilated and a peel away vascular sheath placed over the wire. The wire was removed and the port catheter advanced into position under fluoroscopic guidance. The catheter tip is positioned in the cavoatrial junction. This was documented with a spot image. The portacatheter was then tested and found to flush and aspirate well. The port was flushed with saline followed by 100 units/mL heparinized saline. The pocket was then closed in two layers using first subdermal inverted interrupted absorbable sutures followed by a running subcuticular suture. The epidermis was then sealed with Dermabond. The dermatotomy at the venous access site was also seal with Dermabond. Patient tolerated the procedure well and remained hemodynamically stable throughout. No complications encountered and no significant blood loss encountered IMPRESSION: Status post left IJ port  catheter placement. Catheter ready for use. Signed, Dulcy Fanny. Earleen Newport, DO [facility] Radiology Electronically Signed   By: Corrie Mckusick D.O.   On: 12/17/2015 17:35     ASSESSMENT: 75 y.o.  [place] woman with stage IV [bone only] breast cancer as of May 2014  (1)  status post right lumpectomy and axillary lymph node dissection 11/17/2010 for a pT2 pN1, stage IIB invasive ductal carcinoma, grade 2, estrogen and progesterone receptor positive, HER-2 negative,   (2) Oncotype DX recurrence score of 27 predicting a risk of distant recurrence of 18% with 5 years of tamoxifen (intermediate score);   (3)  status post radiation completed July of 2012,   (4) started anastrozole July 2012, discontinued June 2014 with evidence of metastatic spread to bone  (5) Chronic lymphedema in the right upper extremity.  (6) pathologic fracture of the left humerus along apparent lytic lesion, with no other bone lesions per bone scan 12/12/2012  (7) on 12/27/2012 underwent #1 intramedullary nail left pathologic proximal humerus fracture  #2 left shoulder rotator cuff repair  #3 left shoulder open bone biopsy with pathology showing metastatic breast adenocarcinoma, estrogen receptor 100% positive, progesterone receptor and HER-2 negative, with an MIB-1 of 26%. PET scan 01/13/2013 showed no other areas of disease   (8) status post 35 Gy to the left proximal humerus completed 03/13/2013(  (9) fulvestrant, started on 01/23/2013, stopped May 2017 with progression  (10) zolendronic acid given monthly, first dose 01/23/2013; changed to denosumab as of August 2015 because of access problems, being given every three months  (a) May 2017 dose help after several extractions  (11) multiple liver lesions noted on scans April 2015   (12) letrozole added May 2015, stopped 04/18/14 because of progression in her liver noted on a chest CT performed on 03/14/14  (13) everolimus  and exemestane started 04/19/14, stopped May 2017  (a) MRI of the abdomen 07/12/15 showed stable disease  (b) repeat abdominal MRI 12/04/2015 shows progression  (14) to start CMF chemotherapy (cyclophosphamide, methotrexate, fluorouracil) 12/24/2015  PLAN:  [place] tolerated chemotherapy very well, but her right sided jaw pain is her chief concern. Despite her protests, I heavily insisted that she follow up with her dentist. She will be finishing the prescription for penicillin shortly. I refilled her oxycodone prescription. I was going to prescribe her gabapentin as well, but I saw that she is already prescribed lyrica for a different pain in the past. She still has some of that at home and is willing to try it first.   Chayanne will return in 2 weeks for cycle 2 of CMF. She understands and agrees with this plan. She knows the goal of treatment in her case is control. She has been encouraged to call with any issues that might arise before her next visit here.    Laurie Panda, NP  12/31/2015

## 2016-01-01 LAB — CANCER ANTIGEN 27.29: CA 27.29: 274.5 U/mL — ABNORMAL HIGH (ref 0.0–38.6)

## 2016-01-10 ENCOUNTER — Telehealth: Payer: Self-pay

## 2016-01-10 MED ORDER — MAGIC MOUTHWASH: 5.0000 mL | Freq: Four times a day (QID) | ORAL | Status: AC | PRN

## 2016-01-10 NOTE — Telephone Encounter (Signed)
Pt called with c/o R lower jaw pain. She had a tooth pulled in April.  She had 1st cycle MCF on 5/16. On 5/18 she went to ED with facial pain. On 5.23 she saw Heather in f/u with c/o R lower jaw pain. She is using oxycodone which helps for a little while, She uses hot alternating with cold packs which helps a little bit, she used up her magic mouthwash which helped a little bit. It is a dull pain. Her throat is getting a little sore and it is bothering her eardrum a little bit now. She is using her lyrica as suggested by [employer]. She did not go see her dentist again. "I need something to take this away completely". Next tues is her next cycle of chemo.

## 2016-01-10 NOTE — Telephone Encounter (Signed)
S/w Dr [name] and called pt back. Dr [name] says she needs to see her dentist, there may be a problem in or near the area where the tooth was extracted. Pt states she has adequate oxycodone at home. A rx for magic mouthwash will be sent to [facility].

## 2016-01-13 NOTE — Telephone Encounter (Signed)
01-13-2016 Magic Mouth Wash script found on Triage Printer.  Called order to [facility] with this order (1:1:1 concentration.

## 2016-01-14 ENCOUNTER — Ambulatory Visit (HOSPITAL_BASED_OUTPATIENT_CLINIC_OR_DEPARTMENT_OTHER): Payer: Medicare Other

## 2016-01-14 ENCOUNTER — Other Ambulatory Visit (HOSPITAL_BASED_OUTPATIENT_CLINIC_OR_DEPARTMENT_OTHER): Payer: Medicare Other

## 2016-01-14 ENCOUNTER — Other Ambulatory Visit: Payer: Self-pay | Admitting: Oncology

## 2016-01-14 ENCOUNTER — Telehealth: Payer: Self-pay | Admitting: Nurse Practitioner

## 2016-01-14 ENCOUNTER — Ambulatory Visit (HOSPITAL_BASED_OUTPATIENT_CLINIC_OR_DEPARTMENT_OTHER): Payer: Medicare Other | Admitting: Nurse Practitioner

## 2016-01-14 ENCOUNTER — Encounter: Payer: Self-pay | Admitting: Nurse Practitioner

## 2016-01-14 VITALS — BP 99/54 | HR 64 | Temp 98.2°F | Resp 18 | Ht 60.0 in | Wt 166.6 lb

## 2016-01-14 DIAGNOSIS — C7951 Secondary malignant neoplasm of bone: Secondary | ICD-10-CM

## 2016-01-14 DIAGNOSIS — C787 Secondary malignant neoplasm of liver and intrahepatic bile duct: Secondary | ICD-10-CM

## 2016-01-14 DIAGNOSIS — Z452 Encounter for adjustment and management of vascular access device: Secondary | ICD-10-CM

## 2016-01-14 DIAGNOSIS — R6884 Jaw pain: Secondary | ICD-10-CM | POA: Diagnosis not present

## 2016-01-14 DIAGNOSIS — C50512 Malignant neoplasm of lower-outer quadrant of left female breast: Secondary | ICD-10-CM

## 2016-01-14 DIAGNOSIS — R51 Headache: Secondary | ICD-10-CM

## 2016-01-14 DIAGNOSIS — R519 Headache, unspecified: Secondary | ICD-10-CM

## 2016-01-14 DIAGNOSIS — Z5111 Encounter for antineoplastic chemotherapy: Secondary | ICD-10-CM

## 2016-01-14 DIAGNOSIS — I890 Lymphedema, not elsewhere classified: Secondary | ICD-10-CM

## 2016-01-14 LAB — CBC WITH DIFFERENTIAL/PLATELET
BASO%: 0.9 % (ref 0.0–2.0)
BASOS ABS: 0.1 10*3/uL (ref 0.0–0.1)
EOS%: 1 % (ref 0.0–7.0)
Eosinophils Absolute: 0.1 10*3/uL (ref 0.0–0.5)
HEMATOCRIT: 30.9 % — AB (ref 34.8–46.6)
HGB: 10 g/dL — ABNORMAL LOW (ref 11.6–15.9)
LYMPH%: 27.1 % (ref 14.0–49.7)
MCH: 26.9 pg (ref 25.1–34.0)
MCHC: 32.3 g/dL (ref 31.5–36.0)
MCV: 83.3 fL (ref 79.5–101.0)
MONO#: 1.1 10*3/uL — ABNORMAL HIGH (ref 0.1–0.9)
MONO%: 14.9 % — AB (ref 0.0–14.0)
NEUT#: 4.1 10*3/uL (ref 1.5–6.5)
NEUT%: 56.1 % (ref 38.4–76.8)
Platelets: 481 10*3/uL — ABNORMAL HIGH (ref 145–400)
RBC: 3.71 10*6/uL (ref 3.70–5.45)
RDW: 19.2 % — ABNORMAL HIGH (ref 11.2–14.5)
WBC: 7.4 10*3/uL (ref 3.9–10.3)
lymph#: 2 10*3/uL (ref 0.9–3.3)

## 2016-01-14 LAB — COMPREHENSIVE METABOLIC PANEL
ALBUMIN: 2.6 g/dL — AB (ref 3.5–5.0)
ALK PHOS: 76 U/L (ref 40–150)
ALT: <9 U/L (ref 0–55)
AST: 14 U/L (ref 5–34)
Anion Gap: 8 mEq/L (ref 3–11)
BUN: 5.6 mg/dL — AB (ref 7.0–26.0)
CALCIUM: 9.1 mg/dL (ref 8.4–10.4)
CO2: 29 mEq/L (ref 22–29)
Chloride: 105 mEq/L (ref 98–109)
Creatinine: 0.9 mg/dL (ref 0.6–1.1)
EGFR: 77 mL/min/{1.73_m2} — ABNORMAL LOW (ref >90)
GLUCOSE: 81 mg/dL (ref 70–140)
POTASSIUM: 3.9 meq/L (ref 3.5–5.1)
SODIUM: 141 meq/L (ref 136–145)
TOTAL PROTEIN: 7.1 g/dL (ref 6.4–8.3)

## 2016-01-14 LAB — TECHNOLOGIST REVIEW

## 2016-01-14 MED ORDER — PALONOSETRON HCL INJECTION 0.25 MG/5ML
0.2500 mg | Freq: Once | INTRAVENOUS | Status: AC
  Administered 2016-01-14: 0.25 mg via INTRAVENOUS

## 2016-01-14 MED ORDER — SODIUM CHLORIDE 0.9% FLUSH
3.0000 mL | INTRAVENOUS | Status: DC | PRN
  Filled 2016-01-14: qty 10

## 2016-01-14 MED ORDER — SODIUM CHLORIDE 0.9 % IV SOLN
Freq: Once | INTRAVENOUS | Status: DC
  Administered 2016-01-14: 10:00:00 via INTRAVENOUS

## 2016-01-14 MED ORDER — SODIUM CHLORIDE 0.9 % IV SOLN
Freq: Once | INTRAVENOUS | Status: AC
  Administered 2016-01-14: 11:00:00 via INTRAVENOUS

## 2016-01-14 MED ORDER — ALTEPLASE 2 MG IJ SOLR
2.0000 mg | Freq: Once | INTRAMUSCULAR | Status: AC | PRN
  Administered 2016-01-14: 2 mg
  Filled 2016-01-14: qty 2

## 2016-01-14 MED ORDER — SODIUM CHLORIDE 0.9 % IV SOLN
10.0000 mg | Freq: Once | INTRAVENOUS | Status: AC
  Administered 2016-01-14: 10 mg via INTRAVENOUS
  Filled 2016-01-14: qty 1

## 2016-01-14 MED ORDER — HEPARIN SOD (PORK) LOCK FLUSH 100 UNIT/ML IV SOLN
500.0000 [IU] | Freq: Once | INTRAVENOUS | Status: AC | PRN
  Administered 2016-01-14: 500 [IU]
  Filled 2016-01-14: qty 5

## 2016-01-14 MED ORDER — SODIUM CHLORIDE 0.9% FLUSH
10.0000 mL | INTRAVENOUS | Status: DC | PRN
  Administered 2016-01-14: 10 mL
  Filled 2016-01-14: qty 10

## 2016-01-14 MED ORDER — FLUOROURACIL CHEMO INJECTION 2.5 GM/50ML
600.0000 mg/m2 | Freq: Once | INTRAVENOUS | Status: AC
  Administered 2016-01-14: 1100 mg via INTRAVENOUS
  Filled 2016-01-14: qty 22

## 2016-01-14 MED ORDER — METHOTREXATE SODIUM (PF) CHEMO INJECTION 250 MG/10ML
40.0000 mg/m2 | Freq: Once | INTRAMUSCULAR | Status: AC
  Administered 2016-01-14: 72 mg via INTRAVENOUS
  Filled 2016-01-14: qty 2.88

## 2016-01-14 MED ORDER — PALONOSETRON HCL INJECTION 0.25 MG/5ML
INTRAVENOUS | Status: AC
Start: 2016-01-14 — End: 2016-01-14
  Filled 2016-01-14: qty 5

## 2016-01-14 MED ORDER — CYCLOPHOSPHAMIDE CHEMO INJECTION 1 GM
600.0000 mg/m2 | Freq: Once | INTRAMUSCULAR | Status: AC
  Administered 2016-01-14: 1080 mg via INTRAVENOUS
  Filled 2016-01-14: qty 54

## 2016-01-14 NOTE — Telephone Encounter (Signed)
appt made and avs printed °

## 2016-01-14 NOTE — Patient Instructions (Signed)
[facility] Discharge Instructions for Patients Receiving Chemotherapy  Today you received the following chemotherapy agents :  Cytoxan,  Methotrexate,  Fluorouracil.  To help prevent nausea and vomiting after your treatment, we encourage you to take your nausea medication as prescribed.   If you develop nausea and vomiting that is not controlled by your nausea medication, call the clinic.   BELOW ARE SYMPTOMS THAT SHOULD BE REPORTED IMMEDIATELY:  *FEVER GREATER THAN 100.5 F  *CHILLS WITH OR WITHOUT FEVER  NAUSEA AND VOMITING THAT IS NOT CONTROLLED WITH YOUR NAUSEA MEDICATION  *UNUSUAL SHORTNESS OF BREATH  *UNUSUAL BRUISING OR BLEEDING  TENDERNESS IN MOUTH AND THROAT WITH OR WITHOUT PRESENCE OF ULCERS  *URINARY PROBLEMS  *BOWEL PROBLEMS  UNUSUAL RASH Items with * indicate a potential emergency and should be followed up as soon as possible.  Feel free to call the clinic you have any questions or concerns. The clinic phone number is [phone].  Please show the CHEMO ALERT CARD at check-in to the Emergency Department and triage nurse.   

## 2016-01-14 NOTE — Progress Notes (Signed)
ID: [name]   DOB: 1941-06-25  MR#: [id number]  CSN#:[id number]  PCP: [name], MD GYN:  SU:  OTHER MD:  Alysia Penna, Frederik Pear, Faustino Congress  CC: Breast cancer stage IV  TREATMENT: CMF, denosumab  BREAST CANCER HISTORY: From the original intake note:  The patient had routine screening mammography 10/16/2010 showing a lobulated mass in the right subareolar region measuring up to 5.5 cm. The Left breast showed some central microcalcifications. Biopsy of the Right breast mass on 10/28/2010 showed an invasive ductal carcinoma, grade 2, estrogen receptor positive (Allred score 8), progesterone receptor positive (Allred score 5), with an equivocal HER-2. The Left breast area of microcalcifications was biopsied at the same time, and was read as suspicious for DCIS.  On 11/17/2010 the patient underwent Right lumpectomy and axillary lymph node dissection for what proved to be a 3 cm invasive ductal carcinoma, grade 2, with some papillary and mucinous features. One of 16 lymph nodes was involved. FISH showed no HER-2 amplification. Left breast biopsy was benign.  The patient had an Oncotype sent, with a score of 27, predicting a risk of distant recurrence after 5 years of tamoxifen in the 18% range. With this intermediate result, the decision was made not to proceed with chemotherapy, since the patient is the sole caregiver to her very elderly mother. Instead the patient proceeded to radiation treatment which was completed 03/06/2011. She started anastrozole at that point. Her subsequent history is as detailed below   INTERVAL HISTORY: Courtny returns today for follow up of her metastatic breast cancer, alone. Today is day 1, cycle 2 of CMF, given every 21 days.   REVIEW OF SYSTEMS: Dasja denies fevers, chills, nausea, or vomiting. She continues on amitiza and stool softeners to manage her constipation. She denies mouth sores, rashes, or  neuropathy symptoms. Her appetite is poor. She is not drinking as much water as she knows she should. Her energy level is fair. She is still having discomfort to her right lower jaw where she may need another tooth extracted. She never contacted her dentist regarding this matter as advised. A detailed review of systems is otherwise stable.  PAST MEDICAL HISTORY: Past Medical History  Diagnosis Date  . Club foot   . Arthritis   . Thyroid disease   . Hyperlipidemia   . Osteoporosis   . Hypertension   . GERD (gastroesophageal reflux disease)     watches diet  . Neuromuscular disorder (HCC)     lt arm numb sometimes  . Wears glasses   . History of radiation therapy 02/22/13- 03/13/13    left proximal humerus 3500 cGy 14 sessions  . Lymphedema     right arm  . Diabetes mellitus     metformin  . Cancer [facility])     right breast  . 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 Metastasis from malignant tumor of breast [facility])     liver    PAST SURGICAL HISTORY: Past Surgical History  Procedure Laterality Date  . Cesarean section    . Thyroidectomy  2002  . Foot surgery      as a child for club foot  . Breast surgery  2012    rt lump-16 nodes-in [place]  . Cesarean section    . Colonoscopy    . Dilation and curettage of uterus    . Humerus im nail Left 12/27/2012    Procedure: INTRAMEDULLARY (IM)  NAIL HUMERAL LEFT PATHOLOGIC FRACTURE;  Surgeon: Nita Sells, MD;  Location: [facility];  Service: Orthopedics;  Laterality: Left;    FAMILY HISTORY Family History  Problem Relation Age of Onset  . Cancer Neg Hx   . Arthritis Neg Hx   . Heart disease Neg Hx   . Hyperlipidemia Neg Hx   . Hypertension Neg Hx   . Dementia Mother   . Hearing loss Mother    The patient's father died from unknown causes. The patient's mother is alive at age 54. The patient was a single child. There is no history of breast or ovarian cancer in the family to her  knowledge.  GYNECOLOGIC HISTORY: Menarche age 18, menopause in her early 34s. The patient is GX P1, first pregnancy to term age 53. She did not take hormone replacement  SOCIAL HISTORY: The patient grew up in [place], attended [employer], then moved to [place] where she lived about 40 years. 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 Her mother, [name] away at 100, on 08-26-2014. The patient's daughter [name], 72, is disabled secondary to pulmonary hypertension. She can be reached at [phone]. The patient has 2 grandchildren, [name] who works at a Scientist, physiological business and is 75 years old, in general, 34, completing high school.   ADVANCED DIRECTIVES:not in place  HEALTH MAINTENANCE: Social History  Substance Use Topics  . Smoking status: Former Smoker    Quit date: 12/23/1974  . Smokeless tobacco: Never Used  . Alcohol Use: No     Colonoscopy:  PAP:    Bone density:  2012, on ibandronate chronically  Lipid panel: UTD  Allergies  Allergen Reactions  . Bydureon [Exenatide] Rash  . Gadolinium Derivatives Other (See Comments)    Pt began having chest numbness/tingling , stated her heart felt funny, had to take her to ED for EKG per RN   CAP  . Enalapril Rash    Current Outpatient Prescriptions  Medication Sig Dispense Refill  . aspirin EC 81 MG tablet Take 1 tablet (81 mg total) by mouth daily.    Marland Kitchen atorvastatin (LIPITOR) 80 MG tablet Take 1 tablet (80 mg total) by mouth daily. 30 tablet 11  . clotrimazole-betamethasone (LOTRISONE) cream Apply 1 application topically 2 (two) times daily. 30 g 0  . docusate sodium (COLACE) 100 MG capsule Take 1 capsule (100 mg total) by mouth 2 (two) times daily as needed. Reported on 10/17/2015 (Patient taking differently: Take 100 mg by mouth 2 (two) times daily as needed for mild constipation. Reported on 10/17/2015) 10 capsule 0  . doxepin (SINEQUAN) 25 MG capsule Take 2 capsules (50  mg total) by mouth at bedtime as needed. (Patient taking differently: Take 50 mg by mouth at bedtime as needed (sleep). ) 180 capsule 1  . fentaNYL (DURAGESIC - DOSED MCG/HR) 25 MCG/HR patch Place 1 patch (25 mcg total) onto the skin every 3 (three) days. 5 patch 0  . fluocinonide-emollient (LIDEX-E) 0.05 % cream Apply 1 application topically 2 (two) times daily. 60 g 2  . furosemide (LASIX) 40 MG tablet Take 1 tablet (40 mg total) by mouth daily. 90 tablet 2  . ketoconazole (NIZORAL) 2 % cream Apply 1 application topically daily. 60 g 2  . levothyroxine (SYNTHROID) 175 MCG tablet Take 1 tablet (175 mcg total) by mouth daily before breakfast. 90 tablet 1  . lidocaine-prilocaine (EMLA) cream Apply to affected area once 30 g 3  .  LORazepam (ATIVAN) 0.5 MG tablet Take 15 minutes before MRI test; may repeat x 1 10 tablet 0  . lubiprostone (AMITIZA) 24 MCG capsule Take 1 capsule (24 mcg total) by mouth 2 (two) times daily with a meal. (Patient taking differently: Take 24 mcg by mouth 2 (two) times daily as needed for constipation. ) 180 capsule 1  . magic mouthwash SOLN Take 5 mLs by mouth 4 (four) times daily as needed for mouth pain. 240 mL 3  . metFORMIN (GLUCOPHAGE) 500 MG tablet Take 1 tablet (500 mg total) by mouth daily. 90 tablet 1  . metoCLOPramide (REGLAN) 5 MG tablet Take 1 tablet (5 mg total) by mouth every 8 (eight) hours as needed for nausea. 90 tablet 12  . ondansetron (ZOFRAN) 8 MG tablet Take twice a day starting the evenng of the day of chemo and continuing 2 full days 40 tablet 0  . oxyCODONE (OXY IR/ROXICODONE) 5 MG immediate release tablet Take 1-2 tablets (5-10 mg total) by mouth every 6 (six) hours as needed for severe pain. 100 tablet 0  . penicillin v potassium (VEETID) 500 MG tablet Take 2 tablets (1,000 mg total) by mouth 2 (two) times daily. X 7 days 28 tablet 0  . polyethylene glycol powder (GLYCOLAX/MIRALAX) powder Take 255 g (1 Container total) by mouth daily. 255 g 3  .  potassium chloride SA (K-DUR,KLOR-CON) 20 MEQ tablet Take 1 tablet (20 mEq total) by mouth 2 (two) times daily. 180 tablet 1  . pregabalin (LYRICA) 75 MG capsule Take 1 capsule (75 mg total) by mouth 2 (two) times daily. Not taking (Patient taking differently: Take 75 mg by mouth 2 (two) times daily as needed (bone pain). Not taking) 60 capsule 5  . prochlorperazine (COMPAZINE) 10 MG tablet Take one tablet three times a day before meals tarting the evening of chemo and continuing for an additional two days 30 tablet 0   No current facility-administered medications for this visit.   PHYSICAL EXAM: Elderly African American woman who appears stated age 65 Vitals:   01/14/16 0859  BP: 99/54  Pulse: 64  Temp: 98.2 F (36.8 C)  Resp: 18   Body mass index is 32.54 kg/(m^2).    ECOG FS: 2 Filed Weights   01/14/16 0859  Weight: 166 lb 9.6 oz (75.569 kg)    Sclerae unicteric, pupils round and equal Oropharynx clear and moist-- no thrush or other lesions, dentition in poor repair. Missing bottom right teeth. No cervical or supraclavicular adenopathy Lungs no rales or rhonchi Heart regular rate and rhythm Abd soft, nontender, positive bowel sounds MSK no focal spinal tenderness, grade 3 right upper extremity lymphedema  Neuro: nonfocal, well oriented, appropriate affect Breasts: deferred  LAB RESULTS: Lab Results  Component Value Date   WBC 7.4 01/14/2016   NEUTROABS 4.1 01/14/2016   HGB 10.0* 01/14/2016   HCT 30.9* 01/14/2016   MCV 83.3 01/14/2016   PLT 481* 01/14/2016      Chemistry      Component Value Date/Time   NA 139 12/31/2015 1333   NA 142 12/26/2015 1841   K 4.1 12/31/2015 1333   K 3.7 12/26/2015 1841   CL 104 12/26/2015 1841   CL 105 01/23/2013 1337   CO2 27 12/31/2015 1333   CO2 28 12/17/2015 1313   BUN 12.2 12/31/2015 1333   BUN 13 12/26/2015 1841   CREATININE 0.7 12/31/2015 1333   CREATININE 0.60 12/26/2015 1841      Component Value Date/Time  CALCIUM  9.1 12/31/2015 1333   CALCIUM 9.4 12/17/2015 1313   ALKPHOS 67 12/31/2015 1333   ALKPHOS 77 12/03/2015 1402   AST 13 12/31/2015 1333   AST 19 12/03/2015 1402   ALT <9 12/31/2015 1333   ALT 7 12/03/2015 1402   BILITOT <0.30 12/31/2015 1333   BILITOT 0.5 12/03/2015 1402       Lab Results  Component Value Date   LABCA2 24 11/19/2011    STUDIES: Ct Soft Tissue Neck Wo Contrast  12/26/2015  CLINICAL DATA:  Right jaw/ face oral pain up to the ear and throat pain. Burning and stinging sensation in the lips. Recently began chemotherapy for breast cancer. EXAM: CT NECK WITHOUT CONTRAST TECHNIQUE: Multidetector CT imaging of the neck was performed following the standard protocol without intravenous contrast. COMPARISON:  PET-CT 03/07/2015 FINDINGS: Pharynx and larynx: No mass identified on this unenhanced study. Chronic asymmetry of the piriform sinuses. Unremarkable larynx. No parapharyngeal or retropharyngeal inflammatory change. No significant subcutaneous inflammatory change is seen in the face or neck. Salivary glands: Parotid and submandibular glands are unremarkable. Thyroid: Prior thyroidectomy. Lymph nodes: No enlarged lymph nodes identified in the neck. Vascular: Mild-to-moderate calcified plaque at the carotid bifurcations. Limited intracranial: Unremarkable. Visualized orbits: Unremarkable. Mastoids and visualized paranasal sinuses: Mild bilateral maxillary sinus mucosal thickening. Clear mastoid air cells. Skeleton: No suspicious lytic or blastic osseous lesion. Advanced multilevel cervical facet arthrosis. Advanced disc degeneration at C5-6 greater than C6-7. Upper chest: Partially visualized left jugular Port-A-Cath. Clear lung apices. IMPRESSION: No acute abnormality or mass identified in the neck. Electronically Signed   By: Logan Bores M.D.   On: 12/26/2015 19:43   Ir Fluoro Guide Cv Line Left  12/17/2015  INDICATION: 75 year old female with a history of right breast cancer. EXAM:  IMPLANTED PORT A CATH PLACEMENT WITH ULTRASOUND AND FLUOROSCOPIC GUIDANCE MEDICATIONS: 2.0 g Ancef; The antibiotic was administered within an appropriate time interval prior to skin puncture. ANESTHESIA/SEDATION: Moderate (conscious) sedation was employed during this procedure. A total of Versed 3.0 mg and Fentanyl 50 mcg was administered intravenously. Moderate Sedation Time: 26 minutes. The patient's level of consciousness and vital signs were monitored continuously by radiology nursing throughout the procedure under my direct supervision. FLUOROSCOPY TIME:  Zero minutes, 24 seconds (6 mGy) COMPLICATIONS: None PROCEDURE: The procedure, risks, benefits, and alternatives were explained to the patient. Questions regarding the procedure were encouraged and answered. The patient understands and consents to the procedure. Ultrasound survey was performed with images stored and sent to PACs. The left neck and chest was prepped with chlorhexidine, and draped in the usual sterile fashion using maximum barrier technique (cap and mask, sterile gown, sterile gloves, large sterile sheet, hand hygiene and cutaneous antiseptic). Antibiotic prophylaxis was provided with 2.0g Ancef administered IV one hour prior to skin incision. Local anesthesia was attained by infiltration with 1% lidocaine without epinephrine. Ultrasound demonstrated patency of the left internal jugular vein, and this was documented with an image. Under real-time ultrasound guidance, this vein was accessed with a 21 gauge micropuncture needle and image documentation was performed. A small dermatotomy was made at the access site with an 11 scalpel. A 0.018" wire was advanced into the SVC and used to estimate the length of the internal catheter. The access needle exchanged for a 34F micropuncture vascular sheath. The 0.018" wire was then removed and a 0.035" wire advanced into the IVC. An appropriate location for the subcutaneous reservoir was selected below the  clavicle and an incision was  made through the skin and underlying soft tissues. The subcutaneous tissues were then dissected using a combination of blunt and sharp surgical technique and a pocket was formed. A single lumen power injectable portacatheter was then tunneled through the subcutaneous tissues from the pocket to the dermatotomy and the port reservoir placed within the subcutaneous pocket. The venous access site was then serially dilated and a peel away vascular sheath placed over the wire. The wire was removed and the port catheter advanced into position under fluoroscopic guidance. The catheter tip is positioned in the cavoatrial junction. This was documented with a spot image. The portacatheter was then tested and found to flush and aspirate well. The port was flushed with saline followed by 100 units/mL heparinized saline. The pocket was then closed in two layers using first subdermal inverted interrupted absorbable sutures followed by a running subcuticular suture. The epidermis was then sealed with Dermabond. The dermatotomy at the venous access site was also seal with Dermabond. Patient tolerated the procedure well and remained hemodynamically stable throughout. No complications encountered and no significant blood loss encountered IMPRESSION: Status post left IJ port catheter placement. Catheter ready for use. Signed, Dulcy Fanny. Earleen Newport, DO [facility] Electronically Signed   By: Corrie Mckusick D.O.   On: 12/17/2015 17:35   Ir US Guide Vasc Access Left  12/17/2015  INDICATION: 75 year old female with a history of right breast cancer. EXAM: IMPLANTED PORT A CATH PLACEMENT WITH ULTRASOUND AND FLUOROSCOPIC GUIDANCE MEDICATIONS: 2.0 g Ancef; The antibiotic was administered within an appropriate time interval prior to skin puncture. ANESTHESIA/SEDATION: Moderate (conscious) sedation was employed during this procedure. A total of Versed 3.0 mg and  Fentanyl 50 mcg was administered intravenously. Moderate Sedation Time: 26 minutes. The patient's level of consciousness and vital signs were monitored continuously by radiology nursing throughout the procedure under my direct supervision. FLUOROSCOPY TIME:  Zero minutes, 24 seconds (6 mGy) COMPLICATIONS: None PROCEDURE: The procedure, risks, benefits, and alternatives were explained to the patient. Questions regarding the procedure were encouraged and answered. The patient understands and consents to the procedure. Ultrasound survey was performed with images stored and sent to PACs. The left neck and chest was prepped with chlorhexidine, and draped in the usual sterile fashion using maximum barrier technique (cap and mask, sterile gown, sterile gloves, large sterile sheet, hand hygiene and cutaneous antiseptic). Antibiotic prophylaxis was provided with 2.0g Ancef administered IV one hour prior to skin incision. Local anesthesia was attained by infiltration with 1% lidocaine without epinephrine. Ultrasound demonstrated patency of the left internal jugular vein, and this was documented with an image. Under real-time ultrasound guidance, this vein was accessed with a 21 gauge micropuncture needle and image documentation was performed. A small dermatotomy was made at the access site with an 11 scalpel. A 0.018" wire was advanced into the SVC and used to estimate the length of the internal catheter. The access needle exchanged for a 50F micropuncture vascular sheath. The 0.018" wire was then removed and a 0.035" wire advanced into the IVC. An appropriate location for the subcutaneous reservoir was selected below the clavicle and an incision was made through the skin and underlying soft tissues. The subcutaneous tissues were then dissected using a combination of blunt and sharp surgical technique and a pocket was formed. A single lumen power injectable portacatheter was then tunneled through the subcutaneous tissues from  the pocket to the dermatotomy and the port reservoir placed within the subcutaneous pocket. The venous access  site was then serially dilated and a peel away vascular sheath placed over the wire. The wire was removed and the port catheter advanced into position under fluoroscopic guidance. The catheter tip is positioned in the cavoatrial junction. This was documented with a spot image. The portacatheter was then tested and found to flush and aspirate well. The port was flushed with saline followed by 100 units/mL heparinized saline. The pocket was then closed in two layers using first subdermal inverted interrupted absorbable sutures followed by a running subcuticular suture. The epidermis was then sealed with Dermabond. The dermatotomy at the venous access site was also seal with Dermabond. Patient tolerated the procedure well and remained hemodynamically stable throughout. No complications encountered and no significant blood loss encountered IMPRESSION: Status post left IJ port catheter placement. Catheter ready for use. Signed, Dulcy Fanny. Earleen Newport, DO [facility] Radiology Electronically Signed   By: Corrie Mckusick D.O.   On: 12/17/2015 17:35     ASSESSMENT: 75 y.o.  [place] woman with stage IV [bone only] breast cancer as of May 2014  (1)  status post right lumpectomy and axillary lymph node dissection 11/17/2010 for a pT2 pN1, stage IIB invasive ductal carcinoma, grade 2, estrogen and progesterone receptor positive, HER-2 negative,   (2) Oncotype DX recurrence score of 27 predicting a risk of distant recurrence of 18% with 5 years of tamoxifen (intermediate score);   (3)  status post radiation completed July of 2012,   (4) started anastrozole July 2012, discontinued June 2014 with evidence of metastatic spread to bone  (5) Chronic lymphedema in the right upper extremity.  (6) pathologic fracture of the left humerus along apparent lytic lesion, with  no other bone lesions per bone scan 12/12/2012  (7) on 12/27/2012 underwent #1 intramedullary nail left pathologic proximal humerus fracture  #2 left shoulder rotator cuff repair  #3 left shoulder open bone biopsy with pathology showing metastatic breast adenocarcinoma, estrogen receptor 100% positive, progesterone receptor and HER-2 negative, with an MIB-1 of 26%. PET scan 01/13/2013 showed no other areas of disease   (8) status post 35 Gy to the left proximal humerus completed 03/13/2013(  (9) fulvestrant, started on 01/23/2013, stopped May 2017 with progression  (10) zolendronic acid given monthly, first dose 01/23/2013; changed to denosumab as of August 2015 because of access problems, being given every three months  (a) May 2017 dose help after several extractions  (11) multiple liver lesions noted on scans April 2015   (12) letrozole added May 2015, stopped 04/18/14 because of progression in her liver noted on a chest CT performed on 03/14/14  (13) everolimus and exemestane started 04/19/14, stopped May 2017  (a) MRI of the abdomen 07/12/15 showed stable disease  (b) repeat abdominal MRI 12/04/2015 shows progression  (14) started CMF chemotherapy (cyclophosphamide, methotrexate, fluorouracil) 12/24/2015  PLAN:  Shaday is doing well today. The labs were reviewed in detail and were stable. She will proceed with cycle 2 of CMF as planned. I am adding on IVF to improve her hydration and blood pressure.   She cannot have any dental work while under active chemotherapy, but I think it would be appropriate to put chemo on hold if her dentist needed to perform work to relieve her pain. Nitya has not called her yet to be evaluated, so we will continue with chemo as scheduled.   Jacquelyne will return in 1 week for labs and a nadir visit. She understands and agrees with this plan. She knows  the goal of treatment in her case is control. She has been encouraged to call with any issues  that might arise before her next visit here.    Laurie Panda, NP  01/14/2016

## 2016-01-15 ENCOUNTER — Other Ambulatory Visit: Payer: Self-pay | Admitting: Nurse Practitioner

## 2016-01-15 LAB — CANCER ANTIGEN 27.29: CA 27.29: 273 U/mL — ABNORMAL HIGH (ref 0.0–38.6)

## 2016-01-21 ENCOUNTER — Ambulatory Visit (HOSPITAL_BASED_OUTPATIENT_CLINIC_OR_DEPARTMENT_OTHER): Payer: Medicare Other | Admitting: Nurse Practitioner

## 2016-01-21 ENCOUNTER — Encounter: Payer: Self-pay | Admitting: Nurse Practitioner

## 2016-01-21 ENCOUNTER — Ambulatory Visit: Payer: Medicare Other

## 2016-01-21 ENCOUNTER — Ambulatory Visit (HOSPITAL_BASED_OUTPATIENT_CLINIC_OR_DEPARTMENT_OTHER): Payer: Medicare Other

## 2016-01-21 ENCOUNTER — Other Ambulatory Visit (HOSPITAL_BASED_OUTPATIENT_CLINIC_OR_DEPARTMENT_OTHER): Payer: Medicare Other

## 2016-01-21 VITALS — BP 121/58 | HR 66 | Temp 99.5°F | Resp 18 | Ht 60.0 in | Wt 168.8 lb

## 2016-01-21 DIAGNOSIS — C50512 Malignant neoplasm of lower-outer quadrant of left female breast: Secondary | ICD-10-CM

## 2016-01-21 DIAGNOSIS — C787 Secondary malignant neoplasm of liver and intrahepatic bile duct: Secondary | ICD-10-CM

## 2016-01-21 DIAGNOSIS — C7951 Secondary malignant neoplasm of bone: Secondary | ICD-10-CM

## 2016-01-21 DIAGNOSIS — I890 Lymphedema, not elsewhere classified: Secondary | ICD-10-CM

## 2016-01-21 DIAGNOSIS — R6884 Jaw pain: Secondary | ICD-10-CM

## 2016-01-21 DIAGNOSIS — Z95828 Presence of other vascular implants and grafts: Secondary | ICD-10-CM

## 2016-01-21 LAB — COMPREHENSIVE METABOLIC PANEL
ANION GAP: 6 meq/L (ref 3–11)
AST: 14 U/L (ref 5–34)
Albumin: 2.8 g/dL — ABNORMAL LOW (ref 3.5–5.0)
Alkaline Phosphatase: 66 U/L (ref 40–150)
BUN: 9.7 mg/dL (ref 7.0–26.0)
CHLORIDE: 103 meq/L (ref 98–109)
CO2: 29 meq/L (ref 22–29)
CREATININE: 0.7 mg/dL (ref 0.6–1.1)
Calcium: 8.9 mg/dL (ref 8.4–10.4)
GLUCOSE: 97 mg/dL (ref 70–140)
Potassium: 4.4 mEq/L (ref 3.5–5.1)
SODIUM: 138 meq/L (ref 136–145)
Total Bilirubin: 0.47 mg/dL (ref 0.20–1.20)
Total Protein: 7 g/dL (ref 6.4–8.3)

## 2016-01-21 LAB — CBC WITH DIFFERENTIAL/PLATELET
BASO%: 2.2 % — AB (ref 0.0–2.0)
Basophils Absolute: 0.1 10*3/uL (ref 0.0–0.1)
EOS%: 0.5 % (ref 0.0–7.0)
Eosinophils Absolute: 0 10*3/uL (ref 0.0–0.5)
HCT: 30.7 % — ABNORMAL LOW (ref 34.8–46.6)
HGB: 10 g/dL — ABNORMAL LOW (ref 11.6–15.9)
LYMPH%: 14.7 % (ref 14.0–49.7)
MCH: 27 pg (ref 25.1–34.0)
MCHC: 32.4 g/dL (ref 31.5–36.0)
MCV: 83.2 fL (ref 79.5–101.0)
MONO#: 0.1 10*3/uL (ref 0.1–0.9)
MONO%: 2.2 % (ref 0.0–14.0)
NEUT%: 80.4 % — AB (ref 38.4–76.8)
NEUTROS ABS: 2.4 10*3/uL (ref 1.5–6.5)
PLATELETS: 219 10*3/uL (ref 145–400)
RBC: 3.7 10*6/uL (ref 3.70–5.45)
RDW: 19.2 % — ABNORMAL HIGH (ref 11.2–14.5)
WBC: 3 10*3/uL — AB (ref 3.9–10.3)
lymph#: 0.4 10*3/uL — ABNORMAL LOW (ref 0.9–3.3)

## 2016-01-21 MED ORDER — ALTEPLASE 2 MG IJ SOLR
2.0000 mg | Freq: Once | INTRAMUSCULAR | Status: DC | PRN
  Filled 2016-01-21: qty 2

## 2016-01-21 MED ORDER — HEPARIN SOD (PORK) LOCK FLUSH 100 UNIT/ML IV SOLN
500.0000 [IU] | Freq: Once | INTRAVENOUS | Status: AC | PRN
  Administered 2016-01-21: 500 [IU] via INTRAVENOUS
  Filled 2016-01-21: qty 5

## 2016-01-21 MED ORDER — SODIUM CHLORIDE 0.9 % IJ SOLN
10.0000 mL | INTRAMUSCULAR | Status: DC | PRN
  Administered 2016-01-21: 10 mL via INTRAVENOUS
  Filled 2016-01-21: qty 10

## 2016-01-21 MED ORDER — AMOXICILLIN 500 MG PO TABS: 500.0000 mg | ORAL_TABLET | Freq: Two times a day (BID) | ORAL | Status: DC

## 2016-01-21 NOTE — Patient Instructions (Signed)

## 2016-01-21 NOTE — Progress Notes (Signed)
ID: Christy Harrell   DOB: 1941-03-29  MR#: [id number]  ATF#:[id number]  PCP: [name], MD GYN:  SU:  OTHER MD:  Alysia Penna, Frederik Pear, Faustino Congress  CC: Breast cancer stage IV  TREATMENT: CMF, denosumab  BREAST CANCER HISTORY: From the original intake note:  The patient had routine screening mammography 10/16/2010 showing a lobulated mass in the right subareolar region measuring up to 5.5 cm. The Left breast showed some central microcalcifications. Biopsy of the Right breast mass on 10/28/2010 showed an invasive ductal carcinoma, grade 2, estrogen receptor positive (Allred score 8), progesterone receptor positive (Allred score 5), with an equivocal HER-2. The Left breast area of microcalcifications was biopsied at the same time, and was read as suspicious for DCIS.  On 11/17/2010 the patient underwent Right lumpectomy and axillary lymph node dissection for what proved to be a 3 cm invasive ductal carcinoma, grade 2, with some papillary and mucinous features. One of 16 lymph nodes was involved. FISH showed no HER-2 amplification. Left breast biopsy was benign.  The patient had an Oncotype sent, with a score of 27, predicting a risk of distant recurrence after 5 years of tamoxifen in the 18% range. With this intermediate result, the decision was made not to proceed with chemotherapy, since the patient is the sole caregiver to her very elderly mother. Instead the patient proceeded to radiation treatment which was completed 03/06/2011. She started anastrozole at that point. Her subsequent history is as detailed below   INTERVAL HISTORY: Karielle returns today for follow up of her metastatic breast cancer, alone. Today is day 8, cycle 2 of CMF, given every 21 days.   REVIEW OF SYSTEMS: Lavayah has few side effects from treatment so far. She was constipated prior to the start of chemo, and is already on amitiza and stool softeners daily. She  has some taste changes, so her appetite is down. She has not lost any hair. She denies fevers, chills, nausea, or vomiting. She denies mouth sores, rashes, or neuropathy symptoms. Her main complaint continues to be right lower jaw discomfort from where a tooth was previously removed. She has another tooth that could be bothering her. She never contacted her dentist regarding this matter as advised. She has been taking lyrica, as she was told by the ED that this was neuropathy from chemo, but it is not helping. A detailed review of systems is otherwise stable.  PAST MEDICAL HISTORY: Past Medical History  Diagnosis Date  . Club foot   . Arthritis   . Thyroid disease   . Hyperlipidemia   . Osteoporosis   . Hypertension   . GERD (gastroesophageal reflux disease)     watches diet  . Neuromuscular disorder (HCC)     lt arm numb sometimes  . Wears glasses   . History of radiation therapy 02/22/13- 03/13/13    left proximal humerus 3500 cGy 14 sessions  . Lymphedema     right arm  . Diabetes mellitus     metformin  . Cancer [facility])     right breast  . 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 Metastasis from malignant tumor of breast [facility])     liver    PAST SURGICAL HISTORY: Past Surgical History  Procedure Laterality Date  . Cesarean section    . Thyroidectomy  2002  . Foot surgery      as a child for club foot  . Breast  surgery  2012    rt lump-16 nodes-in [place]  . Cesarean section    . Colonoscopy    . Dilation and curettage of uterus    . Humerus im nail Left 12/27/2012    Procedure: INTRAMEDULLARY (IM) NAIL HUMERAL LEFT PATHOLOGIC FRACTURE;  Surgeon: Nita Sells, MD;  Location: [facility];  Service: Orthopedics;  Laterality: Left;    FAMILY HISTORY Family History  Problem Relation Age of Onset  . Cancer Neg Hx   . Arthritis Neg Hx   . Heart disease Neg Hx   . Hyperlipidemia Neg Hx   . Hypertension Neg Hx   . Dementia  Mother   . Hearing loss Mother    The patient's father died from unknown causes. The patient's mother is alive at age 71. The patient was a single child. There is no history of breast or ovarian cancer in the family to her knowledge.  GYNECOLOGIC HISTORY: Menarche age 73, menopause in her early 55s. The patient is GX P1, first pregnancy to term age 29. She did not take hormone replacement  SOCIAL HISTORY: The patient grew up in [place], attended [employer], then moved to [place] where she lived about 40 years. 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 Her mother, [name] away at 100, on 08/20/14. The patient's daughter [name], 16, is disabled secondary to pulmonary hypertension. She can be reached at [phone]. The patient has 2 grandchildren, [name] who works at a Scientist, physiological business and is 75 years old, in general, 69, completing high school.   ADVANCED DIRECTIVES:not in place  HEALTH MAINTENANCE: Social History  Substance Use Topics  . Smoking status: Former Smoker    Quit date: 12/23/1974  . Smokeless tobacco: Never Used  . Alcohol Use: No     Colonoscopy:  PAP:    Bone density:  2012, on ibandronate chronically  Lipid panel: UTD  Allergies  Allergen Reactions  . Bydureon [Exenatide] Rash  . Gadolinium Derivatives Other (See Comments)    Pt began having chest numbness/tingling , stated her heart felt funny, had to take her to ED for EKG per RN   CAP  . Enalapril Rash    Current Outpatient Prescriptions  Medication Sig Dispense Refill  . aspirin EC 81 MG tablet Take 1 tablet (81 mg total) by mouth daily.    Marland Kitchen atorvastatin (LIPITOR) 80 MG tablet Take 1 tablet (80 mg total) by mouth daily. 30 tablet 11  . clotrimazole-betamethasone (LOTRISONE) cream Apply 1 application topically 2 (two) times daily. 30 g 0  . docusate sodium (COLACE) 100 MG capsule Take 1 capsule (100 mg total) by mouth 2 (two) times daily as  needed. Reported on 10/17/2015 (Patient taking differently: Take 100 mg by mouth 2 (two) times daily as needed for mild constipation. Reported on 10/17/2015) 10 capsule 0  . doxepin (SINEQUAN) 25 MG capsule Take 2 capsules (50 mg total) by mouth at bedtime as needed. (Patient taking differently: Take 50 mg by mouth at bedtime as needed (sleep). ) 180 capsule 1  . fentaNYL (DURAGESIC - DOSED MCG/HR) 25 MCG/HR patch Place 1 patch (25 mcg total) onto the skin every 3 (three) days. 5 patch 0  . fluocinonide-emollient (LIDEX-E) 0.05 % cream Apply 1 application topically 2 (two) times daily. 60 g 2  . furosemide (LASIX) 40 MG tablet Take 1 tablet (40 mg total) by mouth daily. 90 tablet 2  . ketoconazole (NIZORAL) 2 %  cream Apply 1 application topically daily. 60 g 2  . levothyroxine (SYNTHROID) 175 MCG tablet Take 1 tablet (175 mcg total) by mouth daily before breakfast. 90 tablet 1  . lidocaine-prilocaine (EMLA) cream Apply to affected area once 30 g 3  . LORazepam (ATIVAN) 0.5 MG tablet Take 15 minutes before MRI test; may repeat x 1 10 tablet 0  . lubiprostone (AMITIZA) 24 MCG capsule Take 1 capsule (24 mcg total) by mouth 2 (two) times daily with a meal. (Patient taking differently: Take 24 mcg by mouth 2 (two) times daily as needed for constipation. ) 180 capsule 1  . magic mouthwash SOLN Take 5 mLs by mouth 4 (four) times daily as needed for mouth pain. 240 mL 3  . metFORMIN (GLUCOPHAGE) 500 MG tablet Take 1 tablet (500 mg total) by mouth daily. 90 tablet 1  . metoCLOPramide (REGLAN) 5 MG tablet Take 1 tablet (5 mg total) by mouth every 8 (eight) hours as needed for nausea. 90 tablet 12  . ondansetron (ZOFRAN) 8 MG tablet Take twice a day starting the evenng of the day of chemo and continuing 2 full days 40 tablet 0  . oxyCODONE (OXY IR/ROXICODONE) 5 MG immediate release tablet Take 1-2 tablets (5-10 mg total) by mouth every 6 (six) hours as needed for severe pain. 100 tablet 0  . penicillin v potassium  (VEETID) 500 MG tablet Take 2 tablets (1,000 mg total) by mouth 2 (two) times daily. X 7 days 28 tablet 0  . polyethylene glycol powder (GLYCOLAX/MIRALAX) powder Take 255 g (1 Container total) by mouth daily. 255 g 3  . potassium chloride SA (K-DUR,KLOR-CON) 20 MEQ tablet Take 1 tablet (20 mEq total) by mouth 2 (two) times daily. 180 tablet 1  . pregabalin (LYRICA) 75 MG capsule Take 1 capsule (75 mg total) by mouth 2 (two) times daily. Not taking (Patient taking differently: Take 75 mg by mouth 2 (two) times daily as needed (bone pain). Not taking) 60 capsule 5  . prochlorperazine (COMPAZINE) 10 MG tablet Take one tablet three times a day before meals tarting the evening of chemo and continuing for an additional two days 30 tablet 0   No current facility-administered medications for this visit.   Facility-Administered Medications Ordered in Other Visits  Medication Dose Route Frequency Provider Last Rate Last Dose  . alteplase (CATHFLO ACTIVASE) injection 2 mg  2 mg Intracatheter Once PRN Chauncey Cruel, MD      . sodium chloride 0.9 % injection 10 mL  10 mL Intravenous PRN Chauncey Cruel, MD   10 mL at 01/21/16 1321   PHYSICAL EXAM: Elderly African American woman who appears stated age 89 Vitals:   01/21/16 1344  BP: 121/58  Pulse: 66  Temp: 99.5 F (37.5 C)  Resp: 18   Body mass index is 32.97 kg/(m^2).    ECOG FS: 2 Filed Weights   01/21/16 1344  Weight: 168 lb 12.8 oz (76.567 kg)   Skin: warm, dry  HEENT: sclerae anicteric, conjunctivae pink, oropharynx clear. No thrush or mucositis. Poor dentition. Missing bottom right teeth Lymph Nodes: No cervical or supraclavicular lymphadenopathy  Lungs: clear to auscultation bilaterally, no rales, wheezes, or rhonci  Heart: regular rate and rhythm  Abdomen: round, soft, non tender, positive bowel sounds  Musculoskeletal: No focal spinal tenderness, grade 3 right upper extremity lymphedema Neuro: non focal, well oriented, positive  affect  Breasts: deferred  LAB RESULTS: Lab Results  Component Value Date   WBC 7.4  01/14/2016   NEUTROABS 4.1 01/14/2016   HGB 10.0* 01/14/2016   HCT 30.9* 01/14/2016   MCV 83.3 01/14/2016   PLT 481* 01/14/2016      Chemistry      Component Value Date/Time   NA 141 01/14/2016 0843   NA 142 12/26/2015 1841   K 3.9 01/14/2016 0843   K 3.7 12/26/2015 1841   CL 104 12/26/2015 1841   CL 105 01/23/2013 1337   CO2 29 01/14/2016 0843   CO2 28 12/17/2015 1313   BUN 5.6* 01/14/2016 0843   BUN 13 12/26/2015 1841   CREATININE 0.9 01/14/2016 0843   CREATININE 0.60 12/26/2015 1841      Component Value Date/Time   CALCIUM 9.1 01/14/2016 0843   CALCIUM 9.4 12/17/2015 1313   ALKPHOS 76 01/14/2016 0843   ALKPHOS 77 12/03/2015 1402   AST 14 01/14/2016 0843   AST 19 12/03/2015 1402   ALT <9 01/14/2016 0843   ALT 7 12/03/2015 1402   BILITOT <0.30 01/14/2016 0843   BILITOT 0.5 12/03/2015 1402       Lab Results  Component Value Date   LABCA2 24 11/19/2011    STUDIES: Ct Soft Tissue Neck Wo Contrast  12/26/2015  CLINICAL DATA:  Right jaw/ face oral pain up to the ear and throat pain. Burning and stinging sensation in the lips. Recently began chemotherapy for breast cancer. EXAM: CT NECK WITHOUT CONTRAST TECHNIQUE: Multidetector CT imaging of the neck was performed following the standard protocol without intravenous contrast. COMPARISON:  PET-CT 03/07/2015 FINDINGS: Pharynx and larynx: No mass identified on this unenhanced study. Chronic asymmetry of the piriform sinuses. Unremarkable larynx. No parapharyngeal or retropharyngeal inflammatory change. No significant subcutaneous inflammatory change is seen in the face or neck. Salivary glands: Parotid and submandibular glands are unremarkable. Thyroid: Prior thyroidectomy. Lymph nodes: No enlarged lymph nodes identified in the neck. Vascular: Mild-to-moderate calcified plaque at the carotid bifurcations. Limited intracranial: Unremarkable.  Visualized orbits: Unremarkable. Mastoids and visualized paranasal sinuses: Mild bilateral maxillary sinus mucosal thickening. Clear mastoid air cells. Skeleton: No suspicious lytic or blastic osseous lesion. Advanced multilevel cervical facet arthrosis. Advanced disc degeneration at C5-6 greater than C6-7. Upper chest: Partially visualized left jugular Port-A-Cath. Clear lung apices. IMPRESSION: No acute abnormality or mass identified in the neck. Electronically Signed   By: Logan Bores M.D.   On: 12/26/2015 19:43     ASSESSMENT: 75 y.o.  [place] woman with stage IV [bone only] breast cancer as of May 2014  (1)  status post right lumpectomy and axillary lymph node dissection 11/17/2010 for a pT2 pN1, stage IIB invasive ductal carcinoma, grade 2, estrogen and progesterone receptor positive, HER-2 negative,   (2) Oncotype DX recurrence score of 27 predicting a risk of distant recurrence of 18% with 5 years of tamoxifen (intermediate score);   (3)  status post radiation completed July of 2012,   (4) started anastrozole July 2012, discontinued June 2014 with evidence of metastatic spread to bone  (5) Chronic lymphedema in the right upper extremity.  (6) pathologic fracture of the left humerus along apparent lytic lesion, with no other bone lesions per bone scan 12/12/2012  (7) on 12/27/2012 underwent #1 intramedullary nail left pathologic proximal humerus fracture  #2 left shoulder rotator cuff repair  #3 left shoulder open bone biopsy with pathology showing metastatic breast adenocarcinoma, estrogen receptor 100% positive, progesterone receptor and HER-2 negative, with an MIB-1 of 26%. PET scan 01/13/2013 showed no other areas of disease   (8) status  post 35 Gy to the left proximal humerus completed 03/13/2013(  (9) fulvestrant, started on 01/23/2013, stopped May 2017 with progression  (10) zolendronic acid given monthly, first dose 01/23/2013; changed to denosumab as of August 2015  because of access problems, being given every three months  (a) May 2017 dose help after several extractions  (11) multiple liver lesions noted on scans April 2015   (12) letrozole added May 2015, stopped 04/18/14 because of progression in her liver noted on a chest CT performed on 03/14/14  (13) everolimus and exemestane started 04/19/14, stopped May 2017  (a) MRI of the abdomen 07/12/15 showed stable disease  (b) repeat abdominal MRI 12/04/2015 shows progression  (14) started CMF chemotherapy (cyclophosphamide, methotrexate, fluorouracil) 12/24/2015  PLAN:  This is our 3rd encounter where I have advised Aalayah to seek the counsel of her dentist regarding her right lower jaw pain. The lyrica has not been helping, possibly because this is not truly a neuropathy issue. She has another tooth that is giving her problems since one was removed earlier this year. I am going to put her on a course of amoxicillin in case there is an infection brewing, and she has promised that she will call the doctor today. We will push her next cycle of treatment back if necessary to allow for the necessary work.  The labs were reviewed in detail and were stable.  Vertie will return in 2 weeks for cycle 3 of treatment. She understands and agrees with this plan. She knows the goal of treatment in her case is control. She has been encouraged to call with any issues that might arise before her next visit here.    Laurie Panda, NP  01/21/2016

## 2016-01-22 LAB — CANCER ANTIGEN 27.29: CA 27.29: 363.7 U/mL — ABNORMAL HIGH (ref 0.0–38.6)

## 2016-01-27 ENCOUNTER — Other Ambulatory Visit: Payer: Self-pay

## 2016-01-27 DIAGNOSIS — R519 Headache, unspecified: Secondary | ICD-10-CM

## 2016-01-27 DIAGNOSIS — R51 Headache: Principal | ICD-10-CM

## 2016-01-27 MED ORDER — OXYCODONE HCL 5 MG PO TABS: 5.0000 mg | ORAL_TABLET | Freq: Four times a day (QID) | ORAL | Status: DC | PRN

## 2016-02-03 ENCOUNTER — Telehealth: Payer: Self-pay | Admitting: *Deleted

## 2016-02-03 NOTE — Telephone Encounter (Signed)
FYI Voicemail received from "former patient Christy Harrell calling for the [facility]." Called patient to help her but learned "I'm calling for my friend.  She's in pain, needs help and can't get any.  I need the [facility] but no one's heard of it." Alinda Sierras unable to give patient's date of birth during her call but provided an address and doctor's name.  Patient having dental procedure with pain in neck and generalized body pain from chemotherapy.  Advised anyone in severe pain go to ED, contact dentist about procedure pain.  Call ended.   Found patient, calling her in reference to call received from friend.  Tells this nurse she is doing "pretty good".  Advised she update ROI list with names of who she wants information shared.  Reminded her of tomorrow's scheduled F/U.  Antoinette reports Alinda Sierras is in charge of the [facility]. Denies needs or complaints

## 2016-02-04 ENCOUNTER — Telehealth: Payer: Self-pay | Admitting: Oncology

## 2016-02-04 ENCOUNTER — Ambulatory Visit (HOSPITAL_BASED_OUTPATIENT_CLINIC_OR_DEPARTMENT_OTHER): Payer: Medicare Other | Admitting: Oncology

## 2016-02-04 ENCOUNTER — Ambulatory Visit: Payer: Medicare Other

## 2016-02-04 ENCOUNTER — Other Ambulatory Visit: Payer: Self-pay | Admitting: *Deleted

## 2016-02-04 ENCOUNTER — Ambulatory Visit (HOSPITAL_BASED_OUTPATIENT_CLINIC_OR_DEPARTMENT_OTHER): Payer: Medicare Other

## 2016-02-04 ENCOUNTER — Other Ambulatory Visit (HOSPITAL_BASED_OUTPATIENT_CLINIC_OR_DEPARTMENT_OTHER): Payer: Medicare Other

## 2016-02-04 VITALS — BP 114/61 | HR 93 | Temp 99.4°F | Resp 18 | Ht 60.0 in | Wt 163.2 lb

## 2016-02-04 DIAGNOSIS — C7951 Secondary malignant neoplasm of bone: Secondary | ICD-10-CM | POA: Diagnosis not present

## 2016-02-04 DIAGNOSIS — C50512 Malignant neoplasm of lower-outer quadrant of left female breast: Secondary | ICD-10-CM

## 2016-02-04 DIAGNOSIS — M5412 Radiculopathy, cervical region: Secondary | ICD-10-CM

## 2016-02-04 DIAGNOSIS — M4802 Spinal stenosis, cervical region: Secondary | ICD-10-CM

## 2016-02-04 DIAGNOSIS — R51 Headache: Secondary | ICD-10-CM

## 2016-02-04 DIAGNOSIS — C787 Secondary malignant neoplasm of liver and intrahepatic bile duct: Secondary | ICD-10-CM

## 2016-02-04 DIAGNOSIS — R6884 Jaw pain: Secondary | ICD-10-CM

## 2016-02-04 DIAGNOSIS — Z170 Estrogen receptor positive status [ER+]: Secondary | ICD-10-CM

## 2016-02-04 DIAGNOSIS — R519 Headache, unspecified: Secondary | ICD-10-CM

## 2016-02-04 DIAGNOSIS — Z5111 Encounter for antineoplastic chemotherapy: Secondary | ICD-10-CM

## 2016-02-04 DIAGNOSIS — Z95828 Presence of other vascular implants and grafts: Secondary | ICD-10-CM

## 2016-02-04 DIAGNOSIS — G893 Neoplasm related pain (acute) (chronic): Secondary | ICD-10-CM

## 2016-02-04 DIAGNOSIS — M4692 Unspecified inflammatory spondylopathy, cervical region: Secondary | ICD-10-CM

## 2016-02-04 LAB — CBC WITH DIFFERENTIAL/PLATELET
BASO%: 0.4 % (ref 0.0–2.0)
BASOS ABS: 0 10*3/uL (ref 0.0–0.1)
EOS%: 0.6 % (ref 0.0–7.0)
Eosinophils Absolute: 0.1 10*3/uL (ref 0.0–0.5)
HEMATOCRIT: 32 % — AB (ref 34.8–46.6)
HGB: 10.2 g/dL — ABNORMAL LOW (ref 11.6–15.9)
LYMPH#: 2.4 10*3/uL (ref 0.9–3.3)
LYMPH%: 23.4 % (ref 14.0–49.7)
MCH: 27.3 pg (ref 25.1–34.0)
MCHC: 31.9 g/dL (ref 31.5–36.0)
MCV: 85.6 fL (ref 79.5–101.0)
MONO#: 1.3 10*3/uL — AB (ref 0.1–0.9)
MONO%: 12.1 % (ref 0.0–14.0)
NEUT#: 6.6 10*3/uL — ABNORMAL HIGH (ref 1.5–6.5)
NEUT%: 63.5 % (ref 38.4–76.8)
PLATELETS: 420 10*3/uL — AB (ref 145–400)
RBC: 3.74 10*6/uL (ref 3.70–5.45)
RDW: 17 % — ABNORMAL HIGH (ref 11.2–14.5)
WBC: 10.3 10*3/uL (ref 3.9–10.3)

## 2016-02-04 LAB — COMPREHENSIVE METABOLIC PANEL
ALT: 9 U/L (ref 0–55)
ANION GAP: 8 meq/L (ref 3–11)
AST: 15 U/L (ref 5–34)
Albumin: 2.5 g/dL — ABNORMAL LOW (ref 3.5–5.0)
Alkaline Phosphatase: 85 U/L (ref 40–150)
BUN: 10.2 mg/dL (ref 7.0–26.0)
CALCIUM: 9.3 mg/dL (ref 8.4–10.4)
CHLORIDE: 101 meq/L (ref 98–109)
CO2: 29 meq/L (ref 22–29)
Creatinine: 0.8 mg/dL (ref 0.6–1.1)
EGFR: 86 mL/min/{1.73_m2} — AB (ref >90)
Glucose: 115 mg/dl (ref 70–140)
POTASSIUM: 4.4 meq/L (ref 3.5–5.1)
Sodium: 137 mEq/L (ref 136–145)
Total Bilirubin: <0.3 mg/dL (ref 0.20–1.20)
Total Protein: 7.2 g/dL (ref 6.4–8.3)

## 2016-02-04 MED ORDER — HEPARIN SOD (PORK) LOCK FLUSH 100 UNIT/ML IV SOLN
500.0000 [IU] | Freq: Once | INTRAVENOUS | Status: AC | PRN
  Administered 2016-02-04: 500 [IU]
  Filled 2016-02-04: qty 5

## 2016-02-04 MED ORDER — OXYCODONE HCL 5 MG PO TABS: 5.0000 mg | ORAL_TABLET | Freq: Four times a day (QID) | ORAL | Status: DC | PRN

## 2016-02-04 MED ORDER — DEXAMETHASONE SODIUM PHOSPHATE 100 MG/10ML IJ SOLN
10.0000 mg | Freq: Once | INTRAMUSCULAR | Status: AC
  Administered 2016-02-04: 10 mg via INTRAVENOUS
  Filled 2016-02-04: qty 1

## 2016-02-04 MED ORDER — METHOTREXATE SODIUM (PF) CHEMO INJECTION 250 MG/10ML
40.0000 mg/m2 | Freq: Once | INTRAMUSCULAR | Status: AC
  Administered 2016-02-04: 72 mg via INTRAVENOUS
  Filled 2016-02-04: qty 2.88

## 2016-02-04 MED ORDER — SODIUM CHLORIDE 0.9 % IJ SOLN
10.0000 mL | INTRAMUSCULAR | Status: DC | PRN
  Administered 2016-02-04: 10 mL via INTRAVENOUS
  Filled 2016-02-04: qty 10

## 2016-02-04 MED ORDER — SODIUM CHLORIDE 0.9% FLUSH
10.0000 mL | INTRAVENOUS | Status: DC | PRN
  Administered 2016-02-04: 10 mL
  Filled 2016-02-04: qty 10

## 2016-02-04 MED ORDER — SODIUM CHLORIDE 0.9 % IV SOLN
Freq: Once | INTRAVENOUS | Status: AC
  Administered 2016-02-04: 15:00:00 via INTRAVENOUS

## 2016-02-04 MED ORDER — FENTANYL 25 MCG/HR TD PT72: 25.0000 ug | MEDICATED_PATCH | TRANSDERMAL | Status: DC

## 2016-02-04 MED ORDER — AMOXICILLIN-POT CLAVULANATE 875-125 MG PO TABS: 1.0000 | ORAL_TABLET | Freq: Two times a day (BID) | ORAL | Status: DC

## 2016-02-04 MED ORDER — FLUOROURACIL CHEMO INJECTION 2.5 GM/50ML
600.0000 mg/m2 | Freq: Once | INTRAVENOUS | Status: AC
  Administered 2016-02-04: 1100 mg via INTRAVENOUS
  Filled 2016-02-04: qty 22

## 2016-02-04 MED ORDER — PALONOSETRON HCL INJECTION 0.25 MG/5ML
0.2500 mg | Freq: Once | INTRAVENOUS | Status: AC
  Administered 2016-02-04: 0.25 mg via INTRAVENOUS

## 2016-02-04 MED ORDER — PALONOSETRON HCL INJECTION 0.25 MG/5ML
INTRAVENOUS | Status: AC
  Filled 2016-02-04: qty 5

## 2016-02-04 MED ORDER — SODIUM CHLORIDE 0.9 % IV SOLN
600.0000 mg/m2 | Freq: Once | INTRAVENOUS | Status: AC
  Administered 2016-02-04: 1080 mg via INTRAVENOUS
  Filled 2016-02-04: qty 54

## 2016-02-04 NOTE — Telephone Encounter (Signed)
appt made and avs printed °

## 2016-02-04 NOTE — Progress Notes (Signed)
ID: Christy Harrell   DOB: 21-Aug-1940  MR#: [id number]  DDU#:[id number]  PCP: [name], MD GYN:  SU:  OTHER MD:  Alysia Penna, Frederik Pear, Faustino Congress  CC: Breast cancer stage IV  TREATMENT: CMF, [denosumab]  BREAST CANCER HISTORY: From the original intake note:  The patient had routine screening mammography 10/16/2010 showing a lobulated mass in the right subareolar region measuring up to 5.5 cm. The Left breast showed some central microcalcifications. Biopsy of the Right breast mass on 10/28/2010 showed an invasive ductal carcinoma, grade 2, estrogen receptor positive (Allred score 8), progesterone receptor positive (Allred score 5), with an equivocal HER-2. The Left breast area of microcalcifications was biopsied at the same time, and was read as suspicious for DCIS.  On 11/17/2010 the patient underwent Right lumpectomy and axillary lymph node dissection for what proved to be a 3 cm invasive ductal carcinoma, grade 2, with some papillary and mucinous features. One of 16 lymph nodes was involved. FISH showed no HER-2 amplification. Left breast biopsy was benign.  The patient had an Oncotype sent, with a score of 27, predicting a risk of distant recurrence after 5 years of tamoxifen in the 18% range. With this intermediate result, the decision was made not to proceed with chemotherapy, since the patient is the sole caregiver to her very elderly mother. Instead the patient proceeded to radiation treatment which was completed 03/06/2011. She started anastrozole at that point. Her subsequent history is as detailed below   INTERVAL HISTORY: Christy Harrell returns today for follow up of her estrogen receptor positive stage IV breast cancer. Today is day 1, cycle 3 of CMF, given every 21 days.   REVIEW OF SYSTEMS: Christy Harrell is generally tolerating the chemotherapy well. She went to the "sisters" conference this weekend and enjoyed it. She has had no  vomiting problems. She has been able to keep her hair. She does have a bit of a runny nose, sore throat, and arthritis pains which are not more intense or persistent than before. She has hot flashes at times. Of course she has chronic right upper extremity lymphedema. The PEG problems she is struggling with is her teeth. She has significant jaw pain. She is not only taking a good deal of pain medicine for this but has had 2 rounds of antibiotics already without resolution. She has been repeatedly urged to see the dentist. Aside from these issues a detailed review of systems today was noncontributory  PAST MEDICAL HISTORY: Past Medical History  Diagnosis Date  . Club foot   . Arthritis   . Thyroid disease   . Hyperlipidemia   . Osteoporosis   . Hypertension   . GERD (gastroesophageal reflux disease)     watches diet  . Neuromuscular disorder (HCC)     lt arm numb sometimes  . Wears glasses   . History of radiation therapy 02/22/13- 03/13/13    left proximal humerus 3500 cGy 14 sessions  . Lymphedema     right arm  . Diabetes mellitus     metformin  . Cancer [facility])     right breast  . 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 Metastasis from malignant tumor of breast [facility])     liver    PAST SURGICAL HISTORY: Past Surgical History  Procedure Laterality Date  . Cesarean section    . Thyroidectomy  2002  . Foot surgery      as a  child for club foot  . Breast surgery  2012    rt lump-16 nodes-in [place]  . Cesarean section    . Colonoscopy    . Dilation and curettage of uterus    . Humerus im nail Left 12/27/2012    Procedure: INTRAMEDULLARY (IM) NAIL HUMERAL LEFT PATHOLOGIC FRACTURE;  Surgeon: Nita Sells, MD;  Location: [place];  Service: Orthopedics;  Laterality: Left;    FAMILY HISTORY Family History  Problem Relation Age of Onset  . Cancer Neg Hx   . Arthritis Neg Hx   . Heart disease Neg Hx   . Hyperlipidemia Neg Hx    . Hypertension Neg Hx   . Dementia Mother   . Hearing loss Mother    The patient's father died from unknown causes. The patient's mother is alive at age 56. The patient was a single child. There is no history of breast or ovarian cancer in the family to her knowledge.  GYNECOLOGIC HISTORY: Menarche age 78, menopause in her early 7s. The patient is GX P1, first pregnancy to term age 24. She did not take hormone replacement  SOCIAL HISTORY: The patient grew up in [place], attended [employer], then moved to [place] where she lived about 40 years. 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 Her mother, [name] away at 100, on 09/09/2014. The patient's daughter [name], 32, is disabled secondary to pulmonary hypertension. She can be reached at [phone]. The patient has 2 grandchildren, [name] who works at a Scientist, physiological business and is 75 years old, in general, 82, completing high school.   ADVANCED DIRECTIVES:not in place  HEALTH MAINTENANCE: Social History  Substance Use Topics  . Smoking status: Former Smoker    Quit date: 12/23/1974  . Smokeless tobacco: Never Used  . Alcohol Use: No     Colonoscopy:  PAP:    Bone density:  2012, on ibandronate chronically  Lipid panel: UTD  Allergies  Allergen Reactions  . Bydureon [Exenatide] Rash  . Gadolinium Derivatives Other (See Comments)    Pt began having chest numbness/tingling , stated her heart felt funny, had to take her to ED for EKG per RN   CAP  . Enalapril Rash    Current Outpatient Prescriptions  Medication Sig Dispense Refill  . amoxicillin (AMOXIL) 500 MG tablet Take 1 tablet (500 mg total) by mouth 2 (two) times daily. 14 tablet 0  . aspirin EC 81 MG tablet Take 1 tablet (81 mg total) by mouth daily.    Marland Kitchen atorvastatin (LIPITOR) 80 MG tablet Take 1 tablet (80 mg total) by mouth daily. 30 tablet 11  . clotrimazole-betamethasone (LOTRISONE) cream Apply 1  application topically 2 (two) times daily. 30 g 0  . docusate sodium (COLACE) 100 MG capsule Take 1 capsule (100 mg total) by mouth 2 (two) times daily as needed. Reported on 10/17/2015 (Patient taking differently: Take 100 mg by mouth 2 (two) times daily as needed for mild constipation. Reported on 10/17/2015) 10 capsule 0  . doxepin (SINEQUAN) 25 MG capsule Take 2 capsules (50 mg total) by mouth at bedtime as needed. (Patient taking differently: Take 50 mg by mouth at bedtime as needed (sleep). ) 180 capsule 1  . fentaNYL (DURAGESIC - DOSED MCG/HR) 25 MCG/HR patch Place 1 patch (25 mcg total) onto the skin every 3 (three) days. 5 patch 0  . fluocinonide-emollient (LIDEX-E) 0.05 % cream Apply 1 application topically 2 (two) times  daily. 60 g 2  . furosemide (LASIX) 40 MG tablet Take 1 tablet (40 mg total) by mouth daily. 90 tablet 2  . ketoconazole (NIZORAL) 2 % cream Apply 1 application topically daily. 60 g 2  . levothyroxine (SYNTHROID) 175 MCG tablet Take 1 tablet (175 mcg total) by mouth daily before breakfast. 90 tablet 1  . lidocaine-prilocaine (EMLA) cream Apply to affected area once 30 g 3  . LORazepam (ATIVAN) 0.5 MG tablet Take 15 minutes before MRI test; may repeat x 1 10 tablet 0  . lubiprostone (AMITIZA) 24 MCG capsule Take 1 capsule (24 mcg total) by mouth 2 (two) times daily with a meal. (Patient taking differently: Take 24 mcg by mouth 2 (two) times daily as needed for constipation. ) 180 capsule 1  . magic mouthwash SOLN Take 5 mLs by mouth 4 (four) times daily as needed for mouth pain. 240 mL 3  . metFORMIN (GLUCOPHAGE) 500 MG tablet Take 1 tablet (500 mg total) by mouth daily. 90 tablet 1  . metoCLOPramide (REGLAN) 5 MG tablet Take 1 tablet (5 mg total) by mouth every 8 (eight) hours as needed for nausea. 90 tablet 12  . ondansetron (ZOFRAN) 8 MG tablet Take twice a day starting the evenng of the day of chemo and continuing 2 full days 40 tablet 0  . oxyCODONE (OXY IR/ROXICODONE) 5 MG  immediate release tablet Take 1-2 tablets (5-10 mg total) by mouth every 6 (six) hours as needed for severe pain. 100 tablet 0  . penicillin v potassium (VEETID) 500 MG tablet Take 2 tablets (1,000 mg total) by mouth 2 (two) times daily. X 7 days 28 tablet 0  . polyethylene glycol powder (GLYCOLAX/MIRALAX) powder Take 255 g (1 Container total) by mouth daily. 255 g 3  . potassium chloride SA (K-DUR,KLOR-CON) 20 MEQ tablet Take 1 tablet (20 mEq total) by mouth 2 (two) times daily. 180 tablet 1  . pregabalin (LYRICA) 75 MG capsule Take 1 capsule (75 mg total) by mouth 2 (two) times daily. Not taking (Patient taking differently: Take 75 mg by mouth 2 (two) times daily as needed (bone pain). Not taking) 60 capsule 5  . prochlorperazine (COMPAZINE) 10 MG tablet Take one tablet three times a day before meals tarting the evening of chemo and continuing for an additional two days 30 tablet 0   No current facility-administered medications for this visit.   PHYSICAL EXAM: Elderly African American woman  Filed Vitals:   02/04/16 1343  BP: 114/61  Pulse: 93  Temp: 99.4 F (37.4 C)  Resp: 18   Body mass index is 31.87 kg/(m^2).    ECOG FS: 2 Filed Weights   02/04/16 1343  Weight: 163 lb 3.2 oz (74.027 kg)   Sclerae unicteric, EOMs intact Oropharynx clear and slightly dry No cervical or supraclavicular adenopathy Lungs no rales or rhonchi Heart regular rate and rhythm Abd soft, nontender, positive bowel sounds MSK no focal spinal tenderness, chronic. 3 right upper extremity lymphedema Neuro: nonfocal, well oriented, positive affect Breasts: Deferred  LAB RESULTS: Lab Results  Component Value Date   WBC 10.3 02/04/2016   NEUTROABS 6.6* 02/04/2016   HGB 10.2* 02/04/2016   HCT 32.0* 02/04/2016   MCV 85.6 02/04/2016   PLT 420* 02/04/2016      Chemistry      Component Value Date/Time   NA 138 01/21/2016 1312   NA 142 12/26/2015 1841   K 4.4 01/21/2016 1312   K 3.7 12/26/2015 1841   CL  104 12/26/2015 1841   CL 105 01/23/2013 1337   CO2 29 01/21/2016 1312   CO2 28 12/17/2015 1313   BUN 9.7 01/21/2016 1312   BUN 13 12/26/2015 1841   CREATININE 0.7 01/21/2016 1312   CREATININE 0.60 12/26/2015 1841      Component Value Date/Time   CALCIUM 8.9 01/21/2016 1312   CALCIUM 9.4 12/17/2015 1313   ALKPHOS 66 01/21/2016 1312   ALKPHOS 77 12/03/2015 1402   AST 14 01/21/2016 1312   AST 19 12/03/2015 1402   ALT <9 01/21/2016 1312   ALT 7 12/03/2015 1402   BILITOT 0.47 01/21/2016 1312   BILITOT 0.5 12/03/2015 1402       Lab Results  Component Value Date   LABCA2 24 11/19/2011    STUDIES: No results found.   ASSESSMENT: 75 y.o.  [place] woman with stage IV [bone only] breast cancer as of May 2014  (1)  status post right lower outer quadrant lumpectomy and axillary lymph node dissection 11/17/2010 for a pT2 pN1, stage IIB invasive ductal carcinoma, grade 2, estrogen and progesterone receptor positive, HER-2 negative,   (2) Oncotype DX recurrence score of 27 predicting a risk of distant recurrence of 18% with 5 years of tamoxifen (intermediate score);   (3)  status post radiation completed July of 2012,   (4) started anastrozole July 2012, discontinued June 2014 with evidence of metastatic spread to bone  (5) Chronic lymphedema in the right upper extremity.  (6) pathologic fracture of the left humerus along apparent lytic lesion, with no other bone lesions per bone scan 12/12/2012  (7) on 12/27/2012 underwent #1 intramedullary nail left pathologic proximal humerus fracture  #2 left shoulder rotator cuff repair  #3 left shoulder open bone biopsy with pathology showing metastatic breast adenocarcinoma, estrogen receptor 100% positive, progesterone receptor and HER-2 negative, with an MIB-1 of 26%. PET scan 01/13/2013 showed no other areas of disease   (8) status post 35 Gy to the left proximal humerus completed 03/13/2013(  (9) fulvestrant, started on  01/23/2013, stopped May 2017 with progression  (10) zolendronic acid given monthly, first dose 01/23/2013; changed to denosumab as of August 2015 because of access problems, being given every three months  (a) May 2017 dose help after several extractions  (11) multiple liver lesions noted on scans April 2015   (12) letrozole added May 2015, stopped 04/18/14 because of progression in her liver noted on a chest CT performed on 03/14/14  (13) everolimus and exemestane started 04/19/14, stopped May 2017  (a) MRI of the abdomen 07/12/15 showed stable disease  (b) repeat abdominal MRI 12/04/2015 showed progression  (14) started CMF chemotherapy (cyclophosphamide, methotrexate, fluorouracil) 12/24/2015  PLAN:  Christy Harrell will proceed to her third cycle of CMF chemotherapy today. There has been a rise in the CA-27-29, with a further reading pending today. I think it would be worthwhile to obtain a scan before proceeding to cycle 4, and I have placed an order for a liver MRI before her next visit here.  If she has progressed through this treatment we will consider capecitabine. Other possibilities include Doxil and eribulin.  I urged her again to get evaluated by the dentist since the pain she has in her right jaw area is going to be related to dental abscesses. I went ahead and wrote her for antibiotics again, but she has really had at least 2 other courses without resolution. She will let us know if she develops any side effects from the Augmentin.  We  are holding the denosumab/Xgeva until she gets dental clearance       Chauncey Cruel, MD  02/04/2016

## 2016-02-04 NOTE — Patient Instructions (Signed)
[facility] Discharge Instructions for Patients Receiving Chemotherapy  Today you received the following chemotherapy agents Cytoxan, Methotrexate, Adrucil  To help prevent nausea and vomiting after your treatment, we encourage you to take your nausea medication   If you develop nausea and vomiting that is not controlled by your nausea medication, call the clinic.   BELOW ARE SYMPTOMS THAT SHOULD BE REPORTED IMMEDIATELY:  *FEVER GREATER THAN 100.5 F  *CHILLS WITH OR WITHOUT FEVER  NAUSEA AND VOMITING THAT IS NOT CONTROLLED WITH YOUR NAUSEA MEDICATION  *UNUSUAL SHORTNESS OF BREATH  *UNUSUAL BRUISING OR BLEEDING  TENDERNESS IN MOUTH AND THROAT WITH OR WITHOUT PRESENCE OF ULCERS  *URINARY PROBLEMS  *BOWEL PROBLEMS  UNUSUAL RASH Items with * indicate a potential emergency and should be followed up as soon as possible.  Feel free to call the clinic you have any questions or concerns. The clinic phone number is [phone].  Please show the CHEMO ALERT CARD at check-in to the Emergency Department and triage nurse. 

## 2016-02-04 NOTE — Patient Instructions (Signed)

## 2016-02-05 LAB — CANCER ANTIGEN 27.29: CA 27.29: 290.1 U/mL — ABNORMAL HIGH (ref 0.0–38.6)

## 2016-02-10 ENCOUNTER — Ambulatory Visit (HOSPITAL_BASED_OUTPATIENT_CLINIC_OR_DEPARTMENT_OTHER): Payer: Medicare Other

## 2016-02-10 ENCOUNTER — Other Ambulatory Visit (HOSPITAL_BASED_OUTPATIENT_CLINIC_OR_DEPARTMENT_OTHER): Payer: Medicare Other

## 2016-02-10 ENCOUNTER — Ambulatory Visit (HOSPITAL_BASED_OUTPATIENT_CLINIC_OR_DEPARTMENT_OTHER): Payer: Medicare Other | Admitting: Oncology

## 2016-02-10 ENCOUNTER — Encounter: Payer: Self-pay | Admitting: Oncology

## 2016-02-10 VITALS — BP 133/43 | HR 73 | Temp 98.6°F | Resp 19 | Ht 60.0 in | Wt 159.8 lb

## 2016-02-10 DIAGNOSIS — Z95828 Presence of other vascular implants and grafts: Secondary | ICD-10-CM

## 2016-02-10 DIAGNOSIS — Z170 Estrogen receptor positive status [ER+]: Secondary | ICD-10-CM | POA: Diagnosis not present

## 2016-02-10 DIAGNOSIS — C50512 Malignant neoplasm of lower-outer quadrant of left female breast: Secondary | ICD-10-CM

## 2016-02-10 DIAGNOSIS — R519 Headache, unspecified: Secondary | ICD-10-CM

## 2016-02-10 DIAGNOSIS — R51 Headache: Secondary | ICD-10-CM

## 2016-02-10 DIAGNOSIS — C7951 Secondary malignant neoplasm of bone: Secondary | ICD-10-CM

## 2016-02-10 DIAGNOSIS — I890 Lymphedema, not elsewhere classified: Secondary | ICD-10-CM

## 2016-02-10 DIAGNOSIS — C787 Secondary malignant neoplasm of liver and intrahepatic bile duct: Secondary | ICD-10-CM | POA: Diagnosis not present

## 2016-02-10 DIAGNOSIS — R6884 Jaw pain: Secondary | ICD-10-CM

## 2016-02-10 LAB — COMPREHENSIVE METABOLIC PANEL
ALBUMIN: 2.8 g/dL — AB (ref 3.5–5.0)
ALK PHOS: 67 U/L (ref 40–150)
ALT: <9 U/L (ref 0–55)
AST: 13 U/L (ref 5–34)
Anion Gap: 9 mEq/L (ref 3–11)
BUN: 13.3 mg/dL (ref 7.0–26.0)
CO2: 27 meq/L (ref 22–29)
Calcium: 9.1 mg/dL (ref 8.4–10.4)
Chloride: 104 mEq/L (ref 98–109)
Creatinine: 0.7 mg/dL (ref 0.6–1.1)
GLUCOSE: 104 mg/dL (ref 70–140)
POTASSIUM: 4 meq/L (ref 3.5–5.1)
SODIUM: 140 meq/L (ref 136–145)
TOTAL PROTEIN: 7.2 g/dL (ref 6.4–8.3)
Total Bilirubin: 0.44 mg/dL (ref 0.20–1.20)

## 2016-02-10 LAB — CBC WITH DIFFERENTIAL/PLATELET
BASO%: 0.9 % (ref 0.0–2.0)
Basophils Absolute: 0 10*3/uL (ref 0.0–0.1)
EOS%: 0.2 % (ref 0.0–7.0)
Eosinophils Absolute: 0 10*3/uL (ref 0.0–0.5)
HCT: 33 % — ABNORMAL LOW (ref 34.8–46.6)
HEMOGLOBIN: 10.6 g/dL — AB (ref 11.6–15.9)
LYMPH#: 0.9 10*3/uL (ref 0.9–3.3)
LYMPH%: 20.2 % (ref 14.0–49.7)
MCH: 27 pg (ref 25.1–34.0)
MCHC: 32.1 g/dL (ref 31.5–36.0)
MCV: 84 fL (ref 79.5–101.0)
MONO#: 0 10*3/uL — ABNORMAL LOW (ref 0.1–0.9)
MONO%: 0.5 % (ref 0.0–14.0)
NEUT#: 3.5 10*3/uL (ref 1.5–6.5)
NEUT%: 78.2 % — ABNORMAL HIGH (ref 38.4–76.8)
NRBC: 0 % (ref 0–0)
Platelets: 235 10*3/uL (ref 145–400)
RBC: 3.93 10*6/uL (ref 3.70–5.45)
RDW: 16.5 % — AB (ref 11.2–14.5)
WBC: 4.4 10*3/uL (ref 3.9–10.3)

## 2016-02-10 MED ORDER — OXYCODONE HCL 5 MG PO TABS: 5.0000 mg | ORAL_TABLET | Freq: Four times a day (QID) | ORAL | Status: DC | PRN

## 2016-02-10 MED ORDER — SODIUM CHLORIDE 0.9 % IJ SOLN
10.0000 mL | INTRAMUSCULAR | Status: DC | PRN
  Administered 2016-02-10: 10 mL via INTRAVENOUS
  Filled 2016-02-10: qty 10

## 2016-02-10 NOTE — Patient Instructions (Signed)

## 2016-02-10 NOTE — Progress Notes (Signed)
ID: [name]   DOB: 28-Jun-1941  MR#: [id number]  LXB#:[id number]  PCP: [name], MD GYN:  SU:  OTHER MD:  Alysia Penna, Frederik Pear, Faustino Congress  CC: Breast cancer stage IV  TREATMENT: CMF, [denosumab]  BREAST CANCER HISTORY: From the original intake note:  The patient had routine screening mammography 10/16/2010 showing a lobulated mass in the right subareolar region measuring up to 5.5 cm. The Left breast showed some central microcalcifications. Biopsy of the Right breast mass on 10/28/2010 showed an invasive ductal carcinoma, grade 2, estrogen receptor positive (Allred score 8), progesterone receptor positive (Allred score 5), with an equivocal HER-2. The Left breast area of microcalcifications was biopsied at the same time, and was read as suspicious for DCIS.  On 11/17/2010 the patient underwent Right lumpectomy and axillary lymph node dissection for what proved to be a 3 cm invasive ductal carcinoma, grade 2, with some papillary and mucinous features. One of 16 lymph nodes was involved. FISH showed no HER-2 amplification. Left breast biopsy was benign.  The patient had an Oncotype sent, with a score of 27, predicting a risk of distant recurrence after 5 years of tamoxifen in the 18% range. With this intermediate result, the decision was made not to proceed with chemotherapy, since the patient is the sole caregiver to her very elderly mother. Instead the patient proceeded to radiation treatment which was completed 03/06/2011. She started anastrozole at that point. Her subsequent history is as detailed below   INTERVAL HISTORY: Christy Harrell returns today for follow up of her estrogen receptor positive stage IV breast cancer. Today is day 7, cycle 3 of CMF, given every 21 days.   REVIEW OF SYSTEMS: Christy Harrell is generally tolerating the chemotherapy well. She went to the "sisters" conference this weekend and enjoyed it. She has had no  vomiting problems. She has been able to keep her hair. She has hot flashes at times. Of course she has chronic right upper extremity lymphedema. She is struggling with is her teeth. She has significant jaw pain. She is not only taking a good deal of pain medicine for this but has had 2 rounds of antibiotics already without resolution. She has been repeatedly urged to see the dentist. Aside from these issues a detailed review of systems today was noncontributory  PAST MEDICAL HISTORY: Past Medical History  Diagnosis Date  . Club foot   . Arthritis   . Thyroid disease   . Hyperlipidemia   . Osteoporosis   . Hypertension   . GERD (gastroesophageal reflux disease)     watches diet  . Neuromuscular disorder (HCC)     lt arm numb sometimes  . Wears glasses   . History of radiation therapy 02/22/13- 03/13/13    left proximal humerus 3500 cGy 14 sessions  . Lymphedema     right arm  . Diabetes mellitus     metformin  . Cancer [facility])     right breast  . 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 Metastasis from malignant tumor of breast [facility])     liver    PAST SURGICAL HISTORY: Past Surgical History  Procedure Laterality Date  . Cesarean section    . Thyroidectomy  2002  . Foot surgery      as a child for club foot  . Breast surgery  2012    rt lump-16 nodes-in [place]  . Cesarean section    . Colonoscopy    .  Dilation and curettage of uterus    . Humerus im nail Left 12/27/2012    Procedure: INTRAMEDULLARY (IM) NAIL HUMERAL LEFT PATHOLOGIC FRACTURE;  Surgeon: Nita Sells, MD;  Location: [facility];  Service: Orthopedics;  Laterality: Left;    FAMILY HISTORY Family History  Problem Relation Age of Onset  . Cancer Neg Hx   . Arthritis Neg Hx   . Heart disease Neg Hx   . Hyperlipidemia Neg Hx   . Hypertension Neg Hx   . Dementia Mother   . Hearing loss Mother    The patient's father died from unknown causes. The patient's mother  is alive at age 31. The patient was a single child. There is no history of breast or ovarian cancer in the family to her knowledge.  GYNECOLOGIC HISTORY: Menarche age 21, menopause in her early 54s. The patient is GX P1, first pregnancy to term age 78. She did not take hormone replacement  SOCIAL HISTORY: The patient grew up in [place], attended [employer], then moved to [place] where she lived about 40 years. 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 Her mother, [name] away at 100, on 09/02/14. The patient's daughter [name], 61, is disabled secondary to pulmonary hypertension. She can be reached at [phone]. The patient has 2 grandchildren, [name] who works at a Scientist, physiological business and is 75 years old, in general, 73, completing high school.   ADVANCED DIRECTIVES:not in place  HEALTH MAINTENANCE: Social History  Substance Use Topics  . Smoking status: Former Smoker    Quit date: 12/23/1974  . Smokeless tobacco: Never Used  . Alcohol Use: No     Colonoscopy:  PAP:    Bone density:  2012, on ibandronate chronically  Lipid panel: UTD  Allergies  Allergen Reactions  . Bydureon [Exenatide] Rash  . Gadolinium Derivatives Other (See Comments)    Pt began having chest numbness/tingling , stated her heart felt funny, had to take her to ED for EKG per RN   CAP  . Enalapril Rash    Current Outpatient Prescriptions  Medication Sig Dispense Refill  . aspirin EC 81 MG tablet Take 1 tablet (81 mg total) by mouth daily.    Marland Kitchen atorvastatin (LIPITOR) 80 MG tablet Take 1 tablet (80 mg total) by mouth daily. 30 tablet 11  . clotrimazole-betamethasone (LOTRISONE) cream Apply 1 application topically 2 (two) times daily. 30 g 0  . docusate sodium (COLACE) 100 MG capsule Take 1 capsule (100 mg total) by mouth 2 (two) times daily as needed. Reported on 10/17/2015 (Patient taking differently: Take 100 mg by mouth 2 (two) times daily as  needed for mild constipation. Reported on 10/17/2015) 10 capsule 0  . doxepin (SINEQUAN) 25 MG capsule Take 2 capsules (50 mg total) by mouth at bedtime as needed. (Patient taking differently: Take 50 mg by mouth at bedtime as needed (sleep). ) 180 capsule 1  . fentaNYL (DURAGESIC - DOSED MCG/HR) 25 MCG/HR patch Place 1 patch (25 mcg total) onto the skin every 3 (three) days. 5 patch 0  . fluocinonide-emollient (LIDEX-E) 0.05 % cream Apply 1 application topically 2 (two) times daily. 60 g 2  . furosemide (LASIX) 40 MG tablet Take 1 tablet (40 mg total) by mouth daily. 90 tablet 2  . ketoconazole (NIZORAL) 2 % cream Apply 1 application topically daily. 60 g 2  . levothyroxine (SYNTHROID) 175 MCG tablet Take 1 tablet (175 mcg total) by  mouth daily before breakfast. 90 tablet 1  . lidocaine-prilocaine (EMLA) cream Apply to affected area once 30 g 3  . LORazepam (ATIVAN) 0.5 MG tablet Take 15 minutes before MRI test; may repeat x 1 10 tablet 0  . lubiprostone (AMITIZA) 24 MCG capsule Take 1 capsule (24 mcg total) by mouth 2 (two) times daily with a meal. (Patient taking differently: Take 24 mcg by mouth 2 (two) times daily as needed for constipation. ) 180 capsule 1  . magic mouthwash SOLN Take 5 mLs by mouth 4 (four) times daily as needed for mouth pain. 240 mL 3  . metFORMIN (GLUCOPHAGE) 500 MG tablet Take 1 tablet (500 mg total) by mouth daily. 90 tablet 1  . metoCLOPramide (REGLAN) 5 MG tablet Take 1 tablet (5 mg total) by mouth every 8 (eight) hours as needed for nausea. 90 tablet 12  . ondansetron (ZOFRAN) 8 MG tablet Take twice a day starting the evenng of the day of chemo and continuing 2 full days 40 tablet 0  . oxyCODONE (OXY IR/ROXICODONE) 5 MG immediate release tablet Take 1-2 tablets (5-10 mg total) by mouth every 6 (six) hours as needed for severe pain. 100 tablet 0  . polyethylene glycol powder (GLYCOLAX/MIRALAX) powder Take 255 g (1 Container total) by mouth daily. 255 g 3  . potassium  chloride SA (K-DUR,KLOR-CON) 20 MEQ tablet Take 1 tablet (20 mEq total) by mouth 2 (two) times daily. 180 tablet 1  . pregabalin (LYRICA) 75 MG capsule Take 1 capsule (75 mg total) by mouth 2 (two) times daily. Not taking (Patient taking differently: Take 75 mg by mouth 2 (two) times daily as needed (bone pain). Not taking) 60 capsule 5  . prochlorperazine (COMPAZINE) 10 MG tablet Take one tablet three times a day before meals tarting the evening of chemo and continuing for an additional two days 30 tablet 0   No current facility-administered medications for this visit.   PHYSICAL EXAM: Elderly African American woman  Filed Vitals:   02/10/16 1148 02/10/16 1150  BP: 98/44 133/43  Pulse: 73   Temp: 98.6 F (37 C)   Resp: 19    Body mass index is 31.21 kg/(m^2).    ECOG FS: 2 Filed Weights   02/10/16 1148  Weight: 159 lb 12.8 oz (72.485 kg)   Sclerae unicteric, EOMs intact Oropharynx clear and slightly dry No cervical or supraclavicular adenopathy Lungs no rales or rhonchi Heart regular rate and rhythm Abd soft, nontender, positive bowel sounds MSK no focal spinal tenderness, chronic. 3 right upper extremity lymphedema Neuro: nonfocal, well oriented, positive affect Breasts: Deferred  LAB RESULTS: Lab Results  Component Value Date   WBC 4.4 02/10/2016   NEUTROABS 3.5 02/10/2016   HGB 10.6* 02/10/2016   HCT 33.0* 02/10/2016   MCV 84.0 02/10/2016   PLT 235 02/10/2016      Chemistry      Component Value Date/Time   NA 140 02/10/2016 1112   NA 142 12/26/2015 1841   K 4.0 02/10/2016 1112   K 3.7 12/26/2015 1841   CL 104 12/26/2015 1841   CL 105 01/23/2013 1337   CO2 27 02/10/2016 1112   CO2 28 12/17/2015 1313   BUN 13.3 02/10/2016 1112   BUN 13 12/26/2015 1841   CREATININE 0.7 02/10/2016 1112   CREATININE 0.60 12/26/2015 1841      Component Value Date/Time   CALCIUM 9.1 02/10/2016 1112   CALCIUM 9.4 12/17/2015 1313   ALKPHOS 67 02/10/2016 1112  ALKPHOS 77  12/03/2015 1402   AST 13 02/10/2016 1112   AST 19 12/03/2015 1402   ALT <9 02/10/2016 1112   ALT 7 12/03/2015 1402   BILITOT 0.44 02/10/2016 1112   BILITOT 0.5 12/03/2015 1402       Lab Results  Component Value Date   LABCA2 24 11/19/2011    STUDIES: No results found.   ASSESSMENT: 75 y.o.  [place] woman with stage IV [bone only] breast cancer as of May 2014  (1)  status post right lower outer quadrant lumpectomy and axillary lymph node dissection 11/17/2010 for a pT2 pN1, stage IIB invasive ductal carcinoma, grade 2, estrogen and progesterone receptor positive, HER-2 negative,   (2) Oncotype DX recurrence score of 27 predicting a risk of distant recurrence of 18% with 5 years of tamoxifen (intermediate score);   (3)  status post radiation completed July of 2012,   (4) started anastrozole July 2012, discontinued June 2014 with evidence of metastatic spread to bone  (5) Chronic lymphedema in the right upper extremity.  (6) pathologic fracture of the left humerus along apparent lytic lesion, with no other bone lesions per bone scan 12/12/2012  (7) on 12/27/2012 underwent #1 intramedullary nail left pathologic proximal humerus fracture  #2 left shoulder rotator cuff repair  #3 left shoulder open bone biopsy with pathology showing metastatic breast adenocarcinoma, estrogen receptor 100% positive, progesterone receptor and HER-2 negative, with an MIB-1 of 26%. PET scan 01/13/2013 showed no other areas of disease   (8) status post 35 Gy to the left proximal humerus completed 03/13/2013(  (9) fulvestrant, started on 01/23/2013, stopped May 2017 with progression  (10) zolendronic acid given monthly, first dose 01/23/2013; changed to denosumab as of August 2015 because of access problems, being given every three months  (a) May 2017 dose help after several extractions  (11) multiple liver lesions noted on scans April 2015   (12) letrozole added May 2015, stopped 04/18/14  because of progression in her liver noted on a chest CT performed on 03/14/14  (13) everolimus and exemestane started 04/19/14, stopped May 2017  (a) MRI of the abdomen 07/12/15 showed stable disease  (b) repeat abdominal MRI 12/04/2015 showed progression  (14) started CMF chemotherapy (cyclophosphamide, methotrexate, fluorouracil) 12/24/2015  PLAN:  Jenefer has completed 3 cycles of CMF chemotherapy. There was an initial rise in the CA-27-29, but back down prior to cycle 3. Another reading is pending today. An MRI of the liver has already been ordered prior to cycle 4.   If she has progressed through this treatment we will consider capecitabine. Other possibilities include Doxil and eribulin.  I have again urged her again to get evaluated by the dentist since the pain she has in her right jaw area is not improving.   We are holding the denosumab/Xgeva until she gets dental clearance.  Return visit in 2 weeks prior to her 4th cycle of chemo.      Christy Bussing, NP  02/10/2016

## 2016-02-11 LAB — CANCER ANTIGEN 27.29: CAN 27.29: 286.7 U/mL — AB (ref 0.0–38.6)

## 2016-02-18 ENCOUNTER — Encounter (HOSPITAL_COMMUNITY): Payer: Self-pay | Admitting: Emergency Medicine

## 2016-02-18 ENCOUNTER — Emergency Department (HOSPITAL_COMMUNITY): Payer: Medicare Other

## 2016-02-18 ENCOUNTER — Inpatient Hospital Stay (HOSPITAL_COMMUNITY)
Admission: EM | Admit: 2016-02-18 | Discharge: 2016-02-21 | DRG: 871 | Disposition: A | Discharge status: Home / Self Care | Payer: Medicare Other | Attending: Internal Medicine | Admitting: Internal Medicine

## 2016-02-18 DIAGNOSIS — M810 Age-related osteoporosis without current pathological fracture: Secondary | ICD-10-CM | POA: Diagnosis present

## 2016-02-18 DIAGNOSIS — D701 Agranulocytosis secondary to cancer chemotherapy: Secondary | ICD-10-CM | POA: Diagnosis present

## 2016-02-18 DIAGNOSIS — E8989 Other postprocedural endocrine and metabolic complications and disorders: Secondary | ICD-10-CM

## 2016-02-18 DIAGNOSIS — Z7982 Long term (current) use of aspirin: Secondary | ICD-10-CM

## 2016-02-18 DIAGNOSIS — Z853 Personal history of malignant neoplasm of breast: Secondary | ICD-10-CM

## 2016-02-18 DIAGNOSIS — IMO0002 Reserved for concepts with insufficient information to code with codable children: Secondary | ICD-10-CM | POA: Diagnosis present

## 2016-02-18 DIAGNOSIS — Z888 Allergy status to other drugs, medicaments and biological substances status: Secondary | ICD-10-CM

## 2016-02-18 DIAGNOSIS — I890 Lymphedema, not elsewhere classified: Secondary | ICD-10-CM | POA: Diagnosis present

## 2016-02-18 DIAGNOSIS — R6884 Jaw pain: Secondary | ICD-10-CM

## 2016-02-18 DIAGNOSIS — E039 Hypothyroidism, unspecified: Secondary | ICD-10-CM | POA: Diagnosis present

## 2016-02-18 DIAGNOSIS — E1140 Type 2 diabetes mellitus with diabetic neuropathy, unspecified: Secondary | ICD-10-CM | POA: Diagnosis present

## 2016-02-18 DIAGNOSIS — K219 Gastro-esophageal reflux disease without esophagitis: Secondary | ICD-10-CM | POA: Diagnosis present

## 2016-02-18 DIAGNOSIS — C7951 Secondary malignant neoplasm of bone: Secondary | ICD-10-CM | POA: Diagnosis present

## 2016-02-18 DIAGNOSIS — C787 Secondary malignant neoplasm of liver and intrahepatic bile duct: Secondary | ICD-10-CM | POA: Diagnosis present

## 2016-02-18 DIAGNOSIS — Z91041 Radiographic dye allergy status: Secondary | ICD-10-CM

## 2016-02-18 DIAGNOSIS — E785 Hyperlipidemia, unspecified: Secondary | ICD-10-CM | POA: Diagnosis present

## 2016-02-18 DIAGNOSIS — N390 Urinary tract infection, site not specified: Secondary | ICD-10-CM | POA: Diagnosis present

## 2016-02-18 DIAGNOSIS — R4182 Altered mental status, unspecified: Secondary | ICD-10-CM | POA: Diagnosis not present

## 2016-02-18 DIAGNOSIS — D649 Anemia, unspecified: Secondary | ICD-10-CM | POA: Diagnosis present

## 2016-02-18 DIAGNOSIS — E1165 Type 2 diabetes mellitus with hyperglycemia: Secondary | ICD-10-CM | POA: Diagnosis present

## 2016-02-18 DIAGNOSIS — E871 Hypo-osmolality and hyponatremia: Secondary | ICD-10-CM | POA: Diagnosis present

## 2016-02-18 DIAGNOSIS — C50512 Malignant neoplasm of lower-outer quadrant of left female breast: Secondary | ICD-10-CM | POA: Diagnosis present

## 2016-02-18 DIAGNOSIS — Z7984 Long term (current) use of oral hypoglycemic drugs: Secondary | ICD-10-CM

## 2016-02-18 DIAGNOSIS — I10 Essential (primary) hypertension: Secondary | ICD-10-CM | POA: Diagnosis present

## 2016-02-18 DIAGNOSIS — A419 Sepsis, unspecified organism: Principal | ICD-10-CM

## 2016-02-18 DIAGNOSIS — T451X5A Adverse effect of antineoplastic and immunosuppressive drugs, initial encounter: Secondary | ICD-10-CM | POA: Diagnosis present

## 2016-02-18 DIAGNOSIS — R509 Fever, unspecified: Secondary | ICD-10-CM

## 2016-02-18 DIAGNOSIS — Z923 Personal history of irradiation: Secondary | ICD-10-CM

## 2016-02-18 DIAGNOSIS — Z79899 Other long term (current) drug therapy: Secondary | ICD-10-CM

## 2016-02-18 DIAGNOSIS — G9340 Encephalopathy, unspecified: Secondary | ICD-10-CM | POA: Diagnosis present

## 2016-02-18 DIAGNOSIS — Z170 Estrogen receptor positive status [ER+]: Secondary | ICD-10-CM

## 2016-02-18 DIAGNOSIS — R5081 Fever presenting with conditions classified elsewhere: Secondary | ICD-10-CM | POA: Diagnosis present

## 2016-02-18 DIAGNOSIS — E8809 Other disorders of plasma-protein metabolism, not elsewhere classified: Secondary | ICD-10-CM | POA: Diagnosis present

## 2016-02-18 LAB — URINALYSIS, ROUTINE W REFLEX MICROSCOPIC
BILIRUBIN URINE: NEGATIVE
Glucose, UA: NEGATIVE mg/dL
Ketones, ur: NEGATIVE mg/dL
NITRITE: NEGATIVE
PROTEIN: 30 mg/dL — AB
SPECIFIC GRAVITY, URINE: 1.007 (ref 1.005–1.030)
pH: 6.5 (ref 5.0–8.0)

## 2016-02-18 LAB — COMPREHENSIVE METABOLIC PANEL
ALT: 13 U/L — ABNORMAL LOW (ref 14–54)
AST: 40 U/L (ref 15–41)
Albumin: 2.6 g/dL — ABNORMAL LOW (ref 3.5–5.0)
Alkaline Phosphatase: 58 U/L (ref 38–126)
Anion gap: 6 (ref 5–15)
BILIRUBIN TOTAL: 1 mg/dL (ref 0.3–1.2)
BUN: 10 mg/dL (ref 6–20)
CHLORIDE: 99 mmol/L — AB (ref 101–111)
CO2: 29 mmol/L (ref 22–32)
CREATININE: 0.67 mg/dL (ref 0.44–1.00)
Calcium: 8.1 mg/dL — ABNORMAL LOW (ref 8.9–10.3)
Glucose, Bld: 101 mg/dL — ABNORMAL HIGH (ref 65–99)
POTASSIUM: 3.5 mmol/L (ref 3.5–5.1)
Sodium: 134 mmol/L — ABNORMAL LOW (ref 135–145)
TOTAL PROTEIN: 6.2 g/dL — AB (ref 6.5–8.1)

## 2016-02-18 LAB — CBG MONITORING, ED: GLUCOSE-CAPILLARY: 101 mg/dL — AB (ref 65–99)

## 2016-02-18 LAB — URINE MICROSCOPIC-ADD ON

## 2016-02-18 LAB — CBC WITH DIFFERENTIAL/PLATELET
BASOS PCT: 1 %
Basophils Absolute: 0 10*3/uL (ref 0.0–0.1)
EOS ABS: 0.1 10*3/uL (ref 0.0–0.7)
EOS PCT: 2 %
HCT: 28.9 % — ABNORMAL LOW (ref 36.0–46.0)
Hemoglobin: 9.4 g/dL — ABNORMAL LOW (ref 12.0–15.0)
LYMPHS ABS: 1.5 10*3/uL (ref 0.7–4.0)
Lymphocytes Relative: 54 %
MCH: 27.2 pg (ref 26.0–34.0)
MCHC: 32.5 g/dL (ref 30.0–36.0)
MCV: 83.8 fL (ref 78.0–100.0)
MONO ABS: 1 10*3/uL (ref 0.1–1.0)
MONOS PCT: 35 %
Neutro Abs: 0.2 10*3/uL — ABNORMAL LOW (ref 1.7–7.7)
Neutrophils Relative %: 8 %
PLATELETS: 219 10*3/uL (ref 150–400)
RBC: 3.45 MIL/uL — ABNORMAL LOW (ref 3.87–5.11)
RDW: 16.4 % — ABNORMAL HIGH (ref 11.5–15.5)
WBC: 2.8 10*3/uL — ABNORMAL LOW (ref 4.0–10.5)

## 2016-02-18 LAB — I-STAT CG4 LACTIC ACID, ED: LACTIC ACID, VENOUS: 0.97 mmol/L (ref 0.5–1.9)

## 2016-02-18 MED ORDER — SODIUM CHLORIDE 0.9 % IV BOLUS (SEPSIS)
500.0000 mL | Freq: Once | INTRAVENOUS | Status: AC
  Administered 2016-02-18: 500 mL via INTRAVENOUS

## 2016-02-18 MED ORDER — PIPERACILLIN-TAZOBACTAM 3.375 G IVPB 30 MIN
3.3750 g | Freq: Once | INTRAVENOUS | Status: AC
  Administered 2016-02-19: 3.375 g via INTRAVENOUS
  Filled 2016-02-18: qty 50

## 2016-02-18 MED ORDER — SODIUM CHLORIDE 0.9 % IV BOLUS (SEPSIS)
1000.0000 mL | Freq: Once | INTRAVENOUS | Status: AC
  Administered 2016-02-18: 1000 mL via INTRAVENOUS

## 2016-02-18 MED ORDER — VANCOMYCIN HCL IN DEXTROSE 1-5 GM/200ML-% IV SOLN
1000.0000 mg | Freq: Once | INTRAVENOUS | Status: AC
  Administered 2016-02-19: 1000 mg via INTRAVENOUS
  Filled 2016-02-18: qty 200

## 2016-02-18 MED ORDER — ACETAMINOPHEN 500 MG PO TABS
1000.0000 mg | ORAL_TABLET | Freq: Once | ORAL | Status: AC
  Administered 2016-02-18: 1000 mg via ORAL
  Filled 2016-02-18: qty 2

## 2016-02-18 NOTE — ED Notes (Signed)
PA at bedside.

## 2016-02-18 NOTE — ED Notes (Signed)
Bed: [id number] Expected date:  Expected time:  Means of arrival:  Comments: EMS- [facility]

## 2016-02-18 NOTE — ED Provider Notes (Signed)
CSN: [id number]     Arrival date & time 02/18/16  1821 History  By signing my name below, I, Georgette Shell, attest that this documentation has been prepared under the direction and in the presence of [employer], PA-C. Electronically Signed: Georgette Shell, ED Scribe. 02/18/2016. 8:31 PM.   Chief Complaint  Patient presents with  . Altered Mental Status    The history is provided by the patient and a friend. No language interpreter was used.    HPI Comments: Christy Harrell is a 75 y.o. female with h/o HTN, HLD, DM, and breast cancer with bone and liver metastasis (just completed 3rd round of CMF chemotherapy [cyclophosphamide, methotrexate, fluorouracil]; tx initiated 12/24/2015) who presents to the Emergency Department by EMS for altered mental status onset today. Pt states she felt anxious today and also had generalized weakness. She was found to have a fever of 102.11F on arrival to the ED today. Friend at bedside states she was trying to get into pt's house and noticed the patient pacing back and forth. She called the patient from outside her home and noticed that she seemed confused; speech was lethargic and patient was stating she couldn't open the door. Friend also reports that the patient was repeating herself often and was not putting sentences together correctly. Friend called 911 for paramedics to access the home. Patient states that she is feeling fine currently. Per her friend, pt's behavior has improved since arrival. She is currently on chemotherapy and her last session was 2 weeks ago. Pt denies any trauma, injury, or fall. Pt denies cough, SOB, nausea, diarrhea, chest pain, abdominal pain, urinary frequency, and dysuria.  Oncology - Dr. Griffith Citron PCP - Dr. Ronnald Ramp  Past Medical History  Diagnosis Date  . Club foot   . Arthritis   . Thyroid disease   . Hyperlipidemia   . Osteoporosis   . Hypertension   . GERD (gastroesophageal reflux disease)     watches diet  . Neuromuscular disorder  (HCC)     lt arm numb sometimes  . Wears glasses   . History of radiation therapy 02/22/13- 03/13/13    left proximal humerus 3500 cGy 14 sessions  . Lymphedema     right arm  . Diabetes mellitus     metformin  . Cancer [facility])     right breast  . Metastasis from malignant tumor of breast ([place])     left humerous  . Metastasis from malignant tumor of breast [facility])     liver   Past Surgical History  Procedure Laterality Date  . Cesarean section    . Thyroidectomy  2002  . Foot surgery      as a child for club foot  . Breast surgery  2012    rt lump-16 nodes-in [place]  . Cesarean section    . Colonoscopy    . Dilation and curettage of uterus    . Humerus im nail Left 12/27/2012    Procedure: INTRAMEDULLARY (IM) NAIL HUMERAL LEFT PATHOLOGIC FRACTURE;  Surgeon: Nita Sells, MD;  Location: [place];  Service: Orthopedics;  Laterality: Left;   Family History  Problem Relation Age of Onset  . Cancer Neg Hx   . Arthritis Neg Hx   . Heart disease Neg Hx   . Hyperlipidemia Neg Hx   . Hypertension Neg Hx   . Dementia Mother   . Hearing loss Mother    Social History  Substance Use Topics  . Smoking status: Former Smoker  Quit date: 12/23/1974  . Smokeless tobacco: Never Used  . Alcohol Use: No   OB History    No data available      Review of Systems  Constitutional: Positive for fever.  Cardiovascular: Negative for chest pain.  Gastrointestinal: Negative for nausea, vomiting and diarrhea.  Genitourinary: Negative for dysuria and frequency.  Neurological: Positive for weakness (generalized). Negative for syncope.    Allergies  Bydureon; Gadolinium derivatives; and Enalapril  Home Medications   Prior to Admission medications   Medication Sig Start Date End Date Taking? Authorizing Provider  aspirin EC 81 MG tablet Take 1 tablet (81 mg total) by mouth daily. 01/22/14  Yes Sueanne Margarita, MD  atorvastatin (LIPITOR) 80 MG tablet Take 1 tablet (80  mg total) by mouth daily. 02/20/15  Yes Sueanne Margarita, MD  docusate sodium (COLACE) 100 MG capsule Take 1 capsule (100 mg total) by mouth 2 (two) times daily as needed. Reported on 10/17/2015 Patient taking differently: Take 100 mg by mouth 2 (two) times daily as needed for mild constipation. Reported on 10/17/2015 12/03/15  Yes Janith Lima, MD  doxepin (SINEQUAN) 25 MG capsule Take 2 capsules (50 mg total) by mouth at bedtime as needed. Patient taking differently: Take 50 mg by mouth at bedtime as needed (sleep).  09/09/15  Yes Janith Lima, MD  fentaNYL (DURAGESIC - DOSED MCG/HR) 25 MCG/HR patch Place 1 patch (25 mcg total) onto the skin every 3 (three) days. 02/04/16  Yes Chauncey Cruel, MD  furosemide (LASIX) 40 MG tablet Take 1 tablet (40 mg total) by mouth daily. 11/27/15  Yes Janith Lima, MD  levothyroxine (SYNTHROID) 175 MCG tablet Take 1 tablet (175 mcg total) by mouth daily before breakfast. 09/10/15  Yes Janith Lima, MD  lidocaine-prilocaine (EMLA) cream Apply to affected area once 12/11/15  Yes Chauncey Cruel, MD  lubiprostone (AMITIZA) 24 MCG capsule Take 1 capsule (24 mcg total) by mouth 2 (two) times daily with a meal. Patient taking differently: Take 24 mcg by mouth 2 (two) times daily as needed for constipation.  12/03/15  Yes Janith Lima, MD  magic mouthwash SOLN Take 5 mLs by mouth 4 (four) times daily as needed for mouth pain. 01/10/16  Yes Chauncey Cruel, MD  metFORMIN (GLUCOPHAGE) 500 MG tablet Take 1 tablet (500 mg total) by mouth daily. 11/27/15  Yes Janith Lima, MD  oxyCODONE (OXY IR/ROXICODONE) 5 MG immediate release tablet Take 1-2 tablets (5-10 mg total) by mouth every 6 (six) hours as needed for severe pain. 02/10/16  Yes Maryanna Shape, NP  polyethylene glycol powder (GLYCOLAX/MIRALAX) powder Take 255 g (1 Container total) by mouth daily. Patient taking differently: Take 1 Container by mouth daily as needed for mild constipation or moderate constipation.   03/27/14  Yes Irene Shipper, MD  potassium chloride SA (K-DUR,KLOR-CON) 20 MEQ tablet Take 1 tablet (20 mEq total) by mouth 2 (two) times daily. 11/27/15  Yes Janith Lima, MD  pregabalin (LYRICA) 75 MG capsule Take 1 capsule (75 mg total) by mouth 2 (two) times daily. Not taking Patient taking differently: Take 75 mg by mouth 2 (two) times daily as needed (bone pain). Not taking 12/03/15  Yes Janith Lima, MD  clotrimazole-betamethasone (LOTRISONE) cream Apply 1 application topically 2 (two) times daily. Patient not taking: Reported on 02/18/2016 11/29/15   Trula Slade, DPM  fluocinonide-emollient (LIDEX-E) 0.05 % cream Apply 1 application topically 2 (two) times daily.  Patient not taking: Reported on 02/18/2016 09/10/15   Janith Lima, MD  ketoconazole (NIZORAL) 2 % cream Apply 1 application topically daily. Patient not taking: Reported on 02/18/2016 05/03/15   Trula Slade, DPM  LORazepam (ATIVAN) 0.5 MG tablet Take 15 minutes before MRI test; may repeat x 1 Patient not taking: Reported on 02/18/2016 11/21/14   Laurie Panda, NP  metoCLOPramide (REGLAN) 5 MG tablet Take 1 tablet (5 mg total) by mouth every 8 (eight) hours as needed for nausea. Patient not taking: Reported on 02/18/2016 09/19/15   Chauncey Cruel, MD  ondansetron [facility]) 8 MG tablet Take twice a day starting the evenng of the day of chemo and continuing 2 full days Patient not taking: Reported on 02/18/2016 12/11/15   Chauncey Cruel, MD  prochlorperazine (COMPAZINE) 10 MG tablet Take one tablet three times a day before meals tarting the evening of chemo and continuing for an additional two days Patient not taking: Reported on 02/18/2016 12/11/15   Chauncey Cruel, MD   BP 100/55 mmHg  Pulse 74  Temp(Src) 98 F (36.7 C) (Oral)  Resp 15  Ht 5' (1.524 m)  Wt 76.658 kg  BMI 33.01 kg/m2  SpO2 96%   Physical Exam  Constitutional: She is oriented to person, place, and time. She appears well-developed and  well-nourished. No distress.  Nontoxic appearing and in no distress  HENT:  Head: Normocephalic and atraumatic.  Mouth/Throat: Oropharynx is clear and moist. No oropharyngeal exudate.  Symmetric rise of the uvula with phonation  Eyes: Conjunctivae and EOM are normal. Pupils are equal, round, and reactive to light. No scleral icterus.  Neck: Normal range of motion.  Cardiovascular: Normal rate, regular rhythm and intact distal pulses.   Patient not tachycardic as noted in her most recent vital signs  Pulmonary/Chest: Effort normal and breath sounds normal. No respiratory distress. She has no wheezes. She has no rales.  Respirations even and unlabored. Chest expansion symmetric. No rales or rhonchi. Oxygen saturations 99% on room air.  Abdominal: Soft. She exhibits no distension. There is no tenderness. There is no rebound.  Soft, obese, nontender abdomen  Musculoskeletal: Normal range of motion. She exhibits edema.  1+ pitting edema in bilateral lower extremities  Neurological: She is alert and oriented to person, place, and time. No cranial nerve deficit. She exhibits normal muscle tone. Coordination normal.  GCS 15. Speech is goal oriented. No cranial nerve deficits appreciated; symmetric eyebrow raise, no facial drooping, tongue midline. Patient has equal grip strength bilaterally with 5/5 strength against resistance in all major muscles bilaterally. Sensation to light touch intact. Patient moves extremities without ataxia.   Skin: Skin is warm and dry. No rash noted. She is not diaphoretic. No erythema. No pallor.  Psychiatric: She has a normal mood and affect. Her behavior is normal.  Nursing note and vitals reviewed.   ED Course  Procedures  DIAGNOSTIC STUDIES: Oxygen Saturation is 99% on RA, normal by my interpretation.    COORDINATION OF CARE: 8:28 PM Discussed treatment plan with pt at bedside which includes lab work, blood work, and urinalysis and pt agreed to plan.  Labs  Review Labs Reviewed  CBC WITH DIFFERENTIAL/PLATELET - Abnormal; Notable for the following:    WBC 2.8 (*)    RBC 3.45 (*)    Hemoglobin 9.4 (*)    HCT 28.9 (*)    RDW 16.4 (*)    Neutro Abs 0.2 (*)    All other components within  normal limits  COMPREHENSIVE METABOLIC PANEL - Abnormal; Notable for the following:    Sodium 134 (*)    Chloride 99 (*)    Glucose, Bld 101 (*)    Calcium 8.1 (*)    Total Protein 6.2 (*)    Albumin 2.6 (*)    ALT 13 (*)    All other components within normal limits  URINALYSIS, ROUTINE W REFLEX MICROSCOPIC (NOT AT [facility]) - Abnormal; Notable for the following:    APPearance CLOUDY (*)    Hgb urine dipstick SMALL (*)    Protein, ur 30 (*)    Leukocytes, UA LARGE (*)    All other components within normal limits  URINE MICROSCOPIC-ADD ON - Abnormal; Notable for the following:    Squamous Epithelial / LPF 6-30 (*)    Bacteria, UA FEW (*)    All other components within normal limits  CBG MONITORING, ED - Abnormal; Notable for the following:    Glucose-Capillary 101 (*)    All other components within normal limits  CULTURE, BLOOD (ROUTINE X 2)  CULTURE, BLOOD (ROUTINE X 2)  URINE CULTURE  I-STAT CG4 LACTIC ACID, ED    Imaging Review Dg Chest 2 View  02/18/2016  CLINICAL DATA:  Altered mental status, fever EXAM: CHEST  2 VIEW COMPARISON:  05/23/2015 FINDINGS: There is a left-sided Port-A-Cath with the tip projecting over the SVC. There is no focal parenchymal opacity. There is no pleural effusion or pneumothorax. The heart and mediastinal contours are unremarkable. Prior proximal left humeral ORIF. Moderate osteoarthritis of the right glenohumeral joint. IMPRESSION: No active cardiopulmonary disease. Electronically Signed   By: Kathreen Devoid   On: 02/18/2016 20:51     I have personally reviewed and evaluated these images and lab results as part of my medical decision-making.   EKG Interpretation   Date/Time:  Tuesday February 18 2016 21:04:56  EDT Ventricular Rate:  83 PR Interval:    QRS Duration: 135 QT Interval:  380 QTC Calculation: 447 R Axis:   -75 Text Interpretation:  Sinus rhythm RBBB and LAFB Borderline ST elevation,  lateral leads Confirmed by Alvino Chapel  MD, NATHAN [id number]) on 02/18/2016  9:37:23 PM       CRITICAL CARE Performed by: Antonietta Breach   Total critical care time: 35 minutes  Critical care time was exclusive of separately billable procedures and treating other patients.  Critical care was necessary to treat or prevent imminent or life-threatening deterioration.  Critical care was time spent personally by me on the following activities: development of treatment plan with patient and/or surrogate as well as nursing, discussions with consultants, evaluation of patient's response to treatment, examination of patient, obtaining history from patient or surrogate, ordering and performing treatments and interventions, ordering and review of laboratory studies, ordering and review of radiographic studies, pulse oximetry and re-evaluation of patient's condition.  MDM   Final diagnoses:  Sepsis, due to unspecified organism [facility])    75 year old female with a history of breast cancer with metastases to the bone and liver, currently undergoing regular chemotherapy sessions, presents to the emergency department for altered mental status. She was noted to be increasingly lethargic by her friend this afternoon. Patient, herself, complained of a feeling of generalized weakness. She has no focal deficits on physical exam today and friends state that the patient's mentation has returned to baseline. Patient febrile in the emergency department to 102.57F. This improved after being given Tylenol. Unable to determine source of fever. Laboratory workup is notable for  leukopenia. No evidence of urinary tract infection or pneumonia.  Broad spectrum antibiotics initiated in addition to IV fluids. Given fever in setting of  leukopenia and hx of regular chemo infusions, I believe she would benefit from inpatient observation. Case discussed with Dr. Olevia Bowens of [facility] who will admit.  I personally performed the services described in this documentation, which was scribed in my presence. The recorded information has been reviewed and is accurate.     Antonietta Breach, PA-C 02/19/16 0028  Davonna Belling, MD 02/19/16 [phone]

## 2016-02-18 NOTE — ED Notes (Signed)
Celeste Johns (Daughter), listed an emergency contact, is healthcare proxy

## 2016-02-18 NOTE — Progress Notes (Signed)
Pharmacy Antibiotic Note  Christy Harrell is a 75 y.o. female admitted on 02/18/2016 with sepsis.  Pharmacy has been consulted for zosyn/vancomycin dosing.  Plan: Zosyn 3.375 gm IV q8h EI Vancomycin 1Gm x1 then 750mg  IV q12h (VT=15-20 mg/L)  Height: 5' (152.4 cm) Weight: 169 lb (76.658 kg) IBW/kg (Calculated) : 45.5  Temp (24hrs), Avg:101.3 F (38.5 C), Min:100.1 F (37.8 C), Max:102.5 F (39.2 C)   Recent Labs Lab 02/18/16 2113 02/18/16 2123  WBC 2.8*  --   CREATININE 0.67  --   LATICACIDVEN  --  0.97    Estimated Creatinine Clearance: 56.5 mL/min (by C-G formula based on Cr of 0.67).    Allergies  Allergen Reactions  . Bydureon [Exenatide] Rash  . Gadolinium Derivatives Other (See Comments)    Pt began having chest numbness/tingling , stated her heart felt funny, had to take her to ED for EKG per RN   CAP  . Enalapril Rash    Antimicrobials this admission: 7/11 zosyn >>  7/11 vancomycin >>   Dose adjustments this admission:   Microbiology results:  BCx:   UCx:    Sputum:    MRSA PCR:   Thank you for allowing pharmacy to be a part of this patient's care.  Dorrene German 02/18/2016 11:58 PM

## 2016-02-18 NOTE — Progress Notes (Signed)
[facility] consulted for possible discharge needs.  EDCM spoke to patient and two female church friends at bedside.  Patient lives alone.  Patient reports she is usually able to complete her ADL's on her own.  Patient reports she has a cane and shower bench at home.  Patient reports if she need to go food shopping,  her friends will take her.   Patient reports her friends take her to her doctors appointments or she uses the [employer].  Patient reports she does not have any difficulty affording or obtaining her medications.  EDCM provided patient with a list of home health agencies in [employer], explained services.  EDCM also provided patient with list of private duty nursing agencies.  Patient has Medicaid, informed patient that she may ask for PCS services through her pcp.  Patient verbalized understanding.  Agency lists given to friend at bedside per patient request.  Patient thankful for services.  No further EDCM needs at this time.

## 2016-02-18 NOTE — ED Notes (Signed)
Per EMS, patient is from home.  Friend called and stated patient seemed confused.  Patient denies trauma, injury, and falls.  Per EMS, patient passed stroke screen and neuro screen.  BP:105/58 HR:88 R:16 O2:95% on room  CBG:153

## 2016-02-18 NOTE — ED Notes (Signed)
Meal provided to pt

## 2016-02-19 ENCOUNTER — Encounter (HOSPITAL_COMMUNITY): Payer: Self-pay | Admitting: Internal Medicine

## 2016-02-19 ENCOUNTER — Inpatient Hospital Stay (HOSPITAL_COMMUNITY): Payer: Medicare Other

## 2016-02-19 ENCOUNTER — Other Ambulatory Visit: Payer: Self-pay | Admitting: Oncology

## 2016-02-19 DIAGNOSIS — Z91041 Radiographic dye allergy status: Secondary | ICD-10-CM | POA: Diagnosis not present

## 2016-02-19 DIAGNOSIS — E785 Hyperlipidemia, unspecified: Secondary | ICD-10-CM | POA: Diagnosis present

## 2016-02-19 DIAGNOSIS — R4182 Altered mental status, unspecified: Secondary | ICD-10-CM

## 2016-02-19 DIAGNOSIS — E8809 Other disorders of plasma-protein metabolism, not elsewhere classified: Secondary | ICD-10-CM | POA: Diagnosis present

## 2016-02-19 DIAGNOSIS — C50512 Malignant neoplasm of lower-outer quadrant of left female breast: Secondary | ICD-10-CM | POA: Diagnosis not present

## 2016-02-19 DIAGNOSIS — R5081 Fever presenting with conditions classified elsewhere: Secondary | ICD-10-CM | POA: Diagnosis not present

## 2016-02-19 DIAGNOSIS — D701 Agranulocytosis secondary to cancer chemotherapy: Secondary | ICD-10-CM | POA: Diagnosis present

## 2016-02-19 DIAGNOSIS — Z7984 Long term (current) use of oral hypoglycemic drugs: Secondary | ICD-10-CM | POA: Diagnosis not present

## 2016-02-19 DIAGNOSIS — N390 Urinary tract infection, site not specified: Secondary | ICD-10-CM | POA: Diagnosis present

## 2016-02-19 DIAGNOSIS — E1165 Type 2 diabetes mellitus with hyperglycemia: Secondary | ICD-10-CM | POA: Diagnosis present

## 2016-02-19 DIAGNOSIS — Z888 Allergy status to other drugs, medicaments and biological substances status: Secondary | ICD-10-CM | POA: Diagnosis not present

## 2016-02-19 DIAGNOSIS — E039 Hypothyroidism, unspecified: Secondary | ICD-10-CM | POA: Diagnosis present

## 2016-02-19 DIAGNOSIS — K219 Gastro-esophageal reflux disease without esophagitis: Secondary | ICD-10-CM | POA: Diagnosis present

## 2016-02-19 DIAGNOSIS — D649 Anemia, unspecified: Secondary | ICD-10-CM | POA: Diagnosis present

## 2016-02-19 DIAGNOSIS — C7951 Secondary malignant neoplasm of bone: Secondary | ICD-10-CM | POA: Diagnosis not present

## 2016-02-19 DIAGNOSIS — A419 Sepsis, unspecified organism: Secondary | ICD-10-CM | POA: Diagnosis present

## 2016-02-19 DIAGNOSIS — C787 Secondary malignant neoplasm of liver and intrahepatic bile duct: Secondary | ICD-10-CM | POA: Diagnosis present

## 2016-02-19 DIAGNOSIS — D709 Neutropenia, unspecified: Secondary | ICD-10-CM | POA: Diagnosis not present

## 2016-02-19 DIAGNOSIS — Z923 Personal history of irradiation: Secondary | ICD-10-CM | POA: Diagnosis not present

## 2016-02-19 DIAGNOSIS — T451X5A Adverse effect of antineoplastic and immunosuppressive drugs, initial encounter: Secondary | ICD-10-CM | POA: Diagnosis present

## 2016-02-19 DIAGNOSIS — Z79899 Other long term (current) drug therapy: Secondary | ICD-10-CM | POA: Diagnosis not present

## 2016-02-19 DIAGNOSIS — G9340 Encephalopathy, unspecified: Secondary | ICD-10-CM | POA: Diagnosis present

## 2016-02-19 DIAGNOSIS — M810 Age-related osteoporosis without current pathological fracture: Secondary | ICD-10-CM | POA: Diagnosis present

## 2016-02-19 DIAGNOSIS — Z853 Personal history of malignant neoplasm of breast: Secondary | ICD-10-CM | POA: Diagnosis not present

## 2016-02-19 DIAGNOSIS — I890 Lymphedema, not elsewhere classified: Secondary | ICD-10-CM | POA: Diagnosis present

## 2016-02-19 DIAGNOSIS — I10 Essential (primary) hypertension: Secondary | ICD-10-CM | POA: Diagnosis present

## 2016-02-19 DIAGNOSIS — E1140 Type 2 diabetes mellitus with diabetic neuropathy, unspecified: Secondary | ICD-10-CM | POA: Diagnosis present

## 2016-02-19 DIAGNOSIS — Z7982 Long term (current) use of aspirin: Secondary | ICD-10-CM | POA: Diagnosis not present

## 2016-02-19 DIAGNOSIS — N3000 Acute cystitis without hematuria: Secondary | ICD-10-CM | POA: Diagnosis not present

## 2016-02-19 DIAGNOSIS — E871 Hypo-osmolality and hyponatremia: Secondary | ICD-10-CM | POA: Diagnosis present

## 2016-02-19 LAB — CBC WITH DIFFERENTIAL/PLATELET
BASOS ABS: 0 10*3/uL (ref 0.0–0.1)
Basophils Relative: 1 %
EOS PCT: 3 %
Eosinophils Absolute: 0.1 10*3/uL (ref 0.0–0.7)
HEMATOCRIT: 28.9 % — AB (ref 36.0–46.0)
Hemoglobin: 9.3 g/dL — ABNORMAL LOW (ref 12.0–15.0)
LYMPHS ABS: 1 10*3/uL (ref 0.7–4.0)
Lymphocytes Relative: 39 %
MCH: 27.3 pg (ref 26.0–34.0)
MCHC: 32.2 g/dL (ref 30.0–36.0)
MCV: 84.8 fL (ref 78.0–100.0)
MONO ABS: 1.2 10*3/uL — AB (ref 0.1–1.0)
MONOS PCT: 46 %
NEUTROS PCT: 11 %
Neutro Abs: 0.3 10*3/uL — ABNORMAL LOW (ref 1.7–7.7)
PLATELETS: 252 10*3/uL (ref 150–400)
RBC: 3.41 MIL/uL — AB (ref 3.87–5.11)
RDW: 16.4 % — AB (ref 11.5–15.5)
WBC: 2.6 10*3/uL — AB (ref 4.0–10.5)

## 2016-02-19 LAB — COMPREHENSIVE METABOLIC PANEL
ALBUMIN: 2.4 g/dL — AB (ref 3.5–5.0)
ALT: 13 U/L — ABNORMAL LOW (ref 14–54)
ANION GAP: 6 (ref 5–15)
AST: 33 U/L (ref 15–41)
Alkaline Phosphatase: 54 U/L (ref 38–126)
BUN: 8 mg/dL (ref 6–20)
CALCIUM: 7.6 mg/dL — AB (ref 8.9–10.3)
CO2: 25 mmol/L (ref 22–32)
CREATININE: 0.59 mg/dL (ref 0.44–1.00)
Chloride: 104 mmol/L (ref 101–111)
Glucose, Bld: 160 mg/dL — ABNORMAL HIGH (ref 65–99)
Potassium: 3.4 mmol/L — ABNORMAL LOW (ref 3.5–5.1)
SODIUM: 135 mmol/L (ref 135–145)
Total Bilirubin: 0.8 mg/dL (ref 0.3–1.2)
Total Protein: 5.8 g/dL — ABNORMAL LOW (ref 6.5–8.1)

## 2016-02-19 LAB — GLUCOSE, CAPILLARY
GLUCOSE-CAPILLARY: 144 mg/dL — AB (ref 65–99)
Glucose-Capillary: 116 mg/dL — ABNORMAL HIGH (ref 65–99)
Glucose-Capillary: 114 mg/dL — ABNORMAL HIGH (ref 65–99)

## 2016-02-19 MED ORDER — ATORVASTATIN CALCIUM 40 MG PO TABS
80.0000 mg | ORAL_TABLET | Freq: Every day | ORAL | Status: DC
  Administered 2016-02-19 – 2016-02-20 (×2): 80 mg via ORAL
  Filled 2016-02-19 (×2): qty 2

## 2016-02-19 MED ORDER — LEVOTHYROXINE SODIUM 25 MCG PO TABS
175.0000 ug | ORAL_TABLET | Freq: Every day | ORAL | Status: DC
  Administered 2016-02-19 – 2016-02-21 (×3): 175 ug via ORAL
  Filled 2016-02-19 (×3): qty 1

## 2016-02-19 MED ORDER — SODIUM CHLORIDE 0.9% FLUSH
10.0000 mL | INTRAVENOUS | Status: DC | PRN
Start: 2016-02-19 — End: 2016-02-21
  Administered 2016-02-19 – 2016-02-21 (×2): 10 mL
  Filled 2016-02-19 (×2): qty 40

## 2016-02-19 MED ORDER — IOPAMIDOL (ISOVUE-300) INJECTION 61%
75.0000 mL | Freq: Once | INTRAVENOUS | Status: AC | PRN
  Administered 2016-02-19: 75 mL via INTRAVENOUS

## 2016-02-19 MED ORDER — ENOXAPARIN SODIUM 40 MG/0.4ML ~~LOC~~ SOLN
40.0000 mg | SUBCUTANEOUS | Status: DC
  Administered 2016-02-19 – 2016-02-20 (×2): 40 mg via SUBCUTANEOUS
  Filled 2016-02-19 (×3): qty 0.4

## 2016-02-19 MED ORDER — OXYCODONE HCL 5 MG PO TABS
5.0000 mg | ORAL_TABLET | Freq: Four times a day (QID) | ORAL | Status: DC | PRN
  Administered 2016-02-19 – 2016-02-20 (×2): 5 mg via ORAL
  Filled 2016-02-19 (×2): qty 1

## 2016-02-19 MED ORDER — TBO-FILGRASTIM 480 MCG/0.8ML ~~LOC~~ SOSY
480.0000 ug | PREFILLED_SYRINGE | Freq: Every day | SUBCUTANEOUS | Status: AC
  Administered 2016-02-19: 480 ug via SUBCUTANEOUS
  Filled 2016-02-19: qty 0.8

## 2016-02-19 MED ORDER — ONDANSETRON HCL 4 MG PO TABS: 4.0000 mg | ORAL_TABLET | Freq: Four times a day (QID) | ORAL | Status: DC | PRN

## 2016-02-19 MED ORDER — POLYETHYLENE GLYCOL 3350 17 GM/SCOOP PO POWD
1.0000 | Freq: Every day | ORAL | Status: DC | PRN
  Filled 2016-02-19: qty 255

## 2016-02-19 MED ORDER — ASPIRIN EC 81 MG PO TBEC
81.0000 mg | DELAYED_RELEASE_TABLET | Freq: Every day | ORAL | Status: DC
  Administered 2016-02-19 – 2016-02-21 (×3): 81 mg via ORAL
  Filled 2016-02-19 (×4): qty 1

## 2016-02-19 MED ORDER — METFORMIN HCL 500 MG PO TABS
500.0000 mg | ORAL_TABLET | Freq: Every day | ORAL | Status: DC
  Administered 2016-02-19 – 2016-02-21 (×3): 500 mg via ORAL
  Filled 2016-02-19 (×3): qty 1

## 2016-02-19 MED ORDER — ALTEPLASE 2 MG IJ SOLR
2.0000 mg | Freq: Once | INTRAMUSCULAR | Status: AC
  Administered 2016-02-19: 2 mg
  Filled 2016-02-19: qty 2

## 2016-02-19 MED ORDER — METOCLOPRAMIDE HCL 10 MG PO TABS: 5.0000 mg | ORAL_TABLET | Freq: Three times a day (TID) | ORAL | Status: DC | PRN

## 2016-02-19 MED ORDER — FUROSEMIDE 40 MG PO TABS
40.0000 mg | ORAL_TABLET | Freq: Every day | ORAL | Status: DC
  Administered 2016-02-19 – 2016-02-20 (×2): 40 mg via ORAL
  Filled 2016-02-19 (×3): qty 1

## 2016-02-19 MED ORDER — PREGABALIN 75 MG PO CAPS: 75.0000 mg | ORAL_CAPSULE | Freq: Two times a day (BID) | ORAL | Status: DC | PRN

## 2016-02-19 MED ORDER — POTASSIUM CHLORIDE CRYS ER 20 MEQ PO TBCR
20.0000 meq | EXTENDED_RELEASE_TABLET | Freq: Two times a day (BID) | ORAL | Status: DC
  Administered 2016-02-19 – 2016-02-20 (×5): 20 meq via ORAL
  Filled 2016-02-19 (×5): qty 1

## 2016-02-19 MED ORDER — SODIUM CHLORIDE 0.9 % IV SOLN
INTRAVENOUS | Status: DC
  Administered 2016-02-19: 02:00:00 via INTRAVENOUS

## 2016-02-19 MED ORDER — POLYETHYLENE GLYCOL 3350 17 G PO PACK: 17.0000 g | PACK | Freq: Every day | ORAL | Status: DC | PRN

## 2016-02-19 MED ORDER — FENTANYL 25 MCG/HR TD PT72
25.0000 ug | MEDICATED_PATCH | TRANSDERMAL | Status: DC
  Filled 2016-02-19: qty 1

## 2016-02-19 MED ORDER — DOXEPIN HCL 50 MG PO CAPS
50.0000 mg | ORAL_CAPSULE | Freq: Every evening | ORAL | Status: DC | PRN
  Filled 2016-02-19: qty 1

## 2016-02-19 MED ORDER — ONDANSETRON HCL 4 MG/2ML IJ SOLN: 4.0000 mg | Freq: Four times a day (QID) | INTRAMUSCULAR | Status: DC | PRN

## 2016-02-19 MED ORDER — VANCOMYCIN HCL IN DEXTROSE 750-5 MG/150ML-% IV SOLN
750.0000 mg | Freq: Two times a day (BID) | INTRAVENOUS | Status: DC
  Administered 2016-02-19 – 2016-02-20 (×4): 750 mg via INTRAVENOUS
  Filled 2016-02-19 (×5): qty 150

## 2016-02-19 MED ORDER — LUBIPROSTONE 24 MCG PO CAPS
24.0000 ug | ORAL_CAPSULE | Freq: Two times a day (BID) | ORAL | Status: DC
  Administered 2016-02-19 – 2016-02-20 (×3): 24 ug via ORAL
  Filled 2016-02-19 (×5): qty 1

## 2016-02-19 MED ORDER — MAGIC MOUTHWASH
5.0000 mL | Freq: Four times a day (QID) | ORAL | Status: DC | PRN
  Filled 2016-02-19: qty 5

## 2016-02-19 MED ORDER — PROCHLORPERAZINE MALEATE 10 MG PO TABS: 10.0000 mg | ORAL_TABLET | Freq: Four times a day (QID) | ORAL | Status: DC | PRN

## 2016-02-19 MED ORDER — DOCUSATE SODIUM 100 MG PO CAPS: 100.0000 mg | ORAL_CAPSULE | Freq: Two times a day (BID) | ORAL | Status: DC | PRN

## 2016-02-19 MED ORDER — PIPERACILLIN-TAZOBACTAM 3.375 G IVPB
3.3750 g | Freq: Three times a day (TID) | INTRAVENOUS | Status: DC
  Administered 2016-02-19 – 2016-02-20 (×6): 3.375 g via INTRAVENOUS
  Filled 2016-02-19 (×7): qty 50

## 2016-02-19 MED ORDER — ACETAMINOPHEN 325 MG PO TABS
650.0000 mg | ORAL_TABLET | Freq: Four times a day (QID) | ORAL | Status: DC | PRN
  Administered 2016-02-19 – 2016-02-21 (×3): 650 mg via ORAL
  Filled 2016-02-19 (×3): qty 2

## 2016-02-19 NOTE — Progress Notes (Signed)
Patient seen and examined this morning, admitted overnight by Dr. Olevia Bowens, H&P reviewed.   In brief, this is a pleasant 75 yo F wuth metastatic breast cancer currently undergoing chemotherapy, DM2, HTN, HLD, admitted with neutropenic fever.  Neutropenic fever / sepsis - ANC 0.3, febrile - started on broad spectrum antibiotics, continue - cultures pending - has right jaw pain, chronic. Obtain CT maxillofacial to r/o abscess / underlying infection. Also appears to have a UTI.  Acute encephalopathy - in the setting of fever - resolved   UTI (urinary tract infection) - Continue Zosyn and vancomycin. - Follow urine culture and sensitivity.  Type 2 diabetes, uncontrolled, with neuropathy (HCC) - Continue metformin. - CBG monitoring before meals.  Hypothyroidism - Continue levothyroxine 175 g by mouth daily.  Lymphedema of upper extremity following lymphadenectomy - Continue furosemide 40 mg by mouth daily and potassium supplementation.  Breast cancer of lower-outer quadrant of left female breast ([place]) - Continue treatment and follow-up as per oncology team.   Anemia - Monitor hematocrit and hemoglobin closely.  Casanova Schurman M. Cruzita Lederer, MD [facility] [phone]

## 2016-02-19 NOTE — ED Notes (Signed)
Report given to 5E 
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
## 2016-02-19 NOTE — ED Notes (Signed)
PA at bedside.

## 2016-02-20 ENCOUNTER — Telehealth: Payer: Self-pay | Admitting: *Deleted

## 2016-02-20 DIAGNOSIS — D709 Neutropenia, unspecified: Secondary | ICD-10-CM

## 2016-02-20 DIAGNOSIS — N3000 Acute cystitis without hematuria: Secondary | ICD-10-CM

## 2016-02-20 DIAGNOSIS — R5081 Fever presenting with conditions classified elsewhere: Secondary | ICD-10-CM

## 2016-02-20 LAB — COMPREHENSIVE METABOLIC PANEL
ALT: 10 U/L — AB (ref 14–54)
ANION GAP: 5 (ref 5–15)
AST: 24 U/L (ref 15–41)
Albumin: 2.2 g/dL — ABNORMAL LOW (ref 3.5–5.0)
Alkaline Phosphatase: 55 U/L (ref 38–126)
BUN: 6 mg/dL (ref 6–20)
CALCIUM: 7.6 mg/dL — AB (ref 8.9–10.3)
CHLORIDE: 105 mmol/L (ref 101–111)
CO2: 27 mmol/L (ref 22–32)
CREATININE: 0.63 mg/dL (ref 0.44–1.00)
Glucose, Bld: 89 mg/dL (ref 65–99)
Potassium: 3.5 mmol/L (ref 3.5–5.1)
Sodium: 137 mmol/L (ref 135–145)
Total Bilirubin: 0.7 mg/dL (ref 0.3–1.2)
Total Protein: 5.7 g/dL — ABNORMAL LOW (ref 6.5–8.1)

## 2016-02-20 LAB — CBC WITH DIFFERENTIAL/PLATELET
Basophils Absolute: 0 10*3/uL (ref 0.0–0.1)
Basophils Relative: 0 %
EOS ABS: 0.2 10*3/uL (ref 0.0–0.7)
EOS PCT: 3 %
HCT: 26.5 % — ABNORMAL LOW (ref 36.0–46.0)
Hemoglobin: 8.6 g/dL — ABNORMAL LOW (ref 12.0–15.0)
Lymphocytes Relative: 23 %
Lymphs Abs: 1.7 10*3/uL (ref 0.7–4.0)
MCH: 27.4 pg (ref 26.0–34.0)
MCHC: 32.5 g/dL (ref 30.0–36.0)
MCV: 84.4 fL (ref 78.0–100.0)
MONO ABS: 2.3 10*3/uL — AB (ref 0.1–1.0)
Monocytes Relative: 31 %
NEUTROS PCT: 43 %
Neutro Abs: 3.2 10*3/uL (ref 1.7–7.7)
PLATELETS: 293 10*3/uL (ref 150–400)
RBC: 3.14 MIL/uL — AB (ref 3.87–5.11)
RDW: 16.6 % — AB (ref 11.5–15.5)
WBC: 7.4 10*3/uL (ref 4.0–10.5)

## 2016-02-20 LAB — GLUCOSE, CAPILLARY
GLUCOSE-CAPILLARY: 81 mg/dL (ref 65–99)
Glucose-Capillary: 88 mg/dL (ref 65–99)
Glucose-Capillary: 81 mg/dL (ref 65–99)

## 2016-02-20 LAB — URINE CULTURE

## 2016-02-20 MED ORDER — LIP MEDEX EX OINT
TOPICAL_OINTMENT | CUTANEOUS | Status: AC
  Administered 2016-02-20: 1
  Filled 2016-02-20: qty 7

## 2016-02-20 MED ORDER — ENSURE ENLIVE PO LIQD
237.0000 mL | Freq: Two times a day (BID) | ORAL | Status: DC
  Administered 2016-02-20: 237 mL via ORAL

## 2016-02-20 NOTE — Progress Notes (Signed)
PROGRESS NOTE  Christy Harrell [id number] DOB: 06-29-1941 DOA: 02/18/2016 PCP: [name], MD   LOS: 1 day   Brief Narrative: 75 y.o. female with medical history significant of club foot, arthritis, thyroid disease, hyperlipidemia, osteoporosis, hypertension, GERD, type 2 diabetes, metastatic to bone and liver breast cancer (recently completed 3rd round of chemotherapy with cyclophosphamide, methotrexate, fluorouracil) who was brought to the emergency department via EMS due to acute AMS and fever.  Assessment & Plan: Principal Problem:   Altered mental status Active Problems:   Type 2 diabetes, uncontrolled, with neuropathy (HCC)   Hypothyroidism   Lymphedema of upper extremity following lymphadenectomy   Breast cancer of lower-outer quadrant of left female breast ([place])   UTI (urinary tract infection)   Anemia   Neutropenic fever / sepsis - ANC 0.3 on admission, febrile - started on broad spectrum antibiotics, continue for today.  - cultures pending, negative so far - has right jaw pain, chronic.  - CT yesterday without abscesses / lymphadenopathy.   Acute encephalopathy - in the setting of fever - resolved   UTI (urinary tract infection) - Continue Zosyn and vancomycin. - cultures without significant growth but continue to treat  Type 2 diabetes, uncontrolled, with neuropathy (HCC) - Continue metformin. - CBG monitoring before meals.  Hypothyroidism - Continue levothyroxine 175 g by mouth daily.  Lymphedema of upper extremity following lymphadenectomy - Continue furosemide 40 mg by mouth daily and potassium supplementation.  Breast cancer of lower-outer quadrant of left female breast ([place]) - Continue treatment and follow-up as per oncology team.   Anemia - Monitor hematocrit and hemoglobin closely.  DVT prophylaxis: Lovenox Code Status: Full Family Communication: no family bedside Disposition Plan: home 1-2 days if afebrile  Consultants:    Oncology (discussed with Dr. [name] over the phone 7/12)  Procedures:   none  Antimicrobials:  Vancomycin 7/12 >>  Zosyn 7/12 >>    Subjective: - no chest pain, shortness of breath, no abdominal pain, nausea or vomiting. Wants to go home   Objective: Filed Vitals:   02/19/16 1443 02/19/16 2111 02/20/16 0609 02/20/16 0842  BP:  93/44 100/53   Pulse:  68 77   Temp: 99.9 F (37.7 C) 98.5 F (36.9 C) 99.5 F (37.5 C) 99.2 F (37.3 C)  TempSrc: Oral Oral Oral Axillary  Resp:  16 17   Height:      Weight:      SpO2:  100% 98%     Intake/Output Summary (Last 24 hours) at 02/20/16 1150 Last data filed at 02/20/16 0842  Gross per 24 hour  Intake    440 ml  Output      0 ml  Net    440 ml   Filed Weights   02/18/16 1843  Weight: 76.658 kg (169 lb)    Examination: Constitutional: NAD Filed Vitals:   02/19/16 1443 02/19/16 2111 02/20/16 0609 02/20/16 0842  BP:  93/44 100/53   Pulse:  68 77   Temp: 99.9 F (37.7 C) 98.5 F (36.9 C) 99.5 F (37.5 C) 99.2 F (37.3 C)  TempSrc: Oral Oral Oral Axillary  Resp:  16 17   Height:      Weight:      SpO2:  100% 98%    Eyes: PERRL, lids and conjunctivae normal ENMT: Mucous membranes are moist. No oropharyngeal exudates. Mild tenderness right mandible.  Respiratory: clear to auscultation bilaterally, no wheezing, no crackles.  Cardiovascular: Regular rate and rhythm, no murmurs / rubs / gallops.  Abdomen: no tenderness. Bowel sounds positive.  Musculoskeletal: no clubbing / cyanosis.  Skin: no rashes, lesions, ulcers. No induration Neurologic: non focal   Data Reviewed: I have personally reviewed following labs and imaging studies  CBC:  Recent Labs Lab 02/18/16 2113 02/19/16 0530 02/20/16 0445  WBC 2.8* 2.6* 7.4  NEUTROABS 0.2* 0.3* 3.2  HGB 9.4* 9.3* 8.6*  HCT 28.9* 28.9* 26.5*  MCV 83.8 84.8 84.4  PLT 219 252 0000000   Basic Metabolic Panel:  Recent Labs Lab 02/18/16 2113 02/19/16 0619  02/20/16 0445  NA 134* 135 137  K 3.5 3.4* 3.5  CL 99* 104 105  CO2 29 25 27   GLUCOSE 101* 160* 89  BUN 10 8 6   CREATININE 0.67 0.59 0.63  CALCIUM 8.1* 7.6* 7.6*   GFR: Estimated Creatinine Clearance: 56.5 mL/min (by C-G formula based on Cr of 0.63). Liver Function Tests:  Recent Labs Lab 02/18/16 2113 02/19/16 0619 02/20/16 0445  AST 40 33 24  ALT 13* 13* 10*  ALKPHOS 58 54 55  BILITOT 1.0 0.8 0.7  PROT 6.2* 5.8* 5.7*  ALBUMIN 2.6* 2.4* 2.2*   No results for input(s): LIPASE, AMYLASE in the last 168 hours. No results for input(s): AMMONIA in the last 168 hours. Coagulation Profile: No results for input(s): INR, PROTIME in the last 168 hours. Cardiac Enzymes: No results for input(s): CKTOTAL, CKMB, CKMBINDEX, TROPONINI in the last 168 hours. BNP (last 3 results) No results for input(s): PROBNP in the last 8760 hours. HbA1C: No results for input(s): HGBA1C in the last 72 hours. CBG:  Recent Labs Lab 02/19/16 0724 02/19/16 1205 02/19/16 1631 02/20/16 0725 02/20/16 1148  GLUCAP 144* 116* 114* 88 81   Lipid Profile: No results for input(s): CHOL, HDL, LDLCALC, TRIG, CHOLHDL, LDLDIRECT in the last 72 hours. Thyroid Function Tests: No results for input(s): TSH, T4TOTAL, FREET4, T3FREE, THYROIDAB in the last 72 hours. Anemia Panel: No results for input(s): VITAMINB12, FOLATE, FERRITIN, TIBC, IRON, RETICCTPCT in the last 72 hours. Urine analysis:    Component Value Date/Time   COLORURINE YELLOW 02/18/2016 2327   APPEARANCEUR CLOUDY* 02/18/2016 2327   LABSPEC 1.007 02/18/2016 2327   PHURINE 6.5 02/18/2016 2327   GLUCOSEU NEGATIVE 02/18/2016 2327   GLUCOSEU NEGATIVE 10/19/2014 1246   HGBUR SMALL* 02/18/2016 2327   BILIRUBINUR NEGATIVE 02/18/2016 2327   KETONESUR NEGATIVE 02/18/2016 2327   PROTEINUR 30* 02/18/2016 2327   UROBILINOGEN 1.0 10/19/2014 1246   NITRITE NEGATIVE 02/18/2016 2327   LEUKOCYTESUR LARGE* 02/18/2016 2327   Sepsis Labs: Invalid  input(s): PROCALCITONIN, LACTICIDVEN  Recent Results (from the past 240 hour(s))  Blood Culture (routine x 2)     Status: None (Preliminary result)   Collection Time: 02/18/16  9:09 PM  Result Value Ref Range Status   Specimen Description PORTA CATH  Final   Special Requests BOTTLES DRAWN AEROBIC AND ANAEROBIC 5CC  Final   Culture   Final    NO GROWTH < 24 HOURS Performed at [facility]    Report Status PENDING  Incomplete  Urine culture     Status: Abnormal   Collection Time: 02/18/16 11:27 PM  Result Value Ref Range Status   Specimen Description URINE, CLEAN CATCH  Final   Special Requests Immunocompromised  Final   Culture MULTIPLE SPECIES PRESENT, SUGGEST RECOLLECTION (A)  Final   Report Status 02/20/2016 FINAL  Final      Radiology Studies: Dg Chest 2 View  02/18/2016  CLINICAL DATA:  Altered mental status, fever  EXAM: CHEST  2 VIEW COMPARISON:  05/23/2015 FINDINGS: There is a left-sided Port-A-Cath with the tip projecting over the SVC. There is no focal parenchymal opacity. There is no pleural effusion or pneumothorax. The heart and mediastinal contours are unremarkable. Prior proximal left humeral ORIF. Moderate osteoarthritis of the right glenohumeral joint. IMPRESSION: No active cardiopulmonary disease. Electronically Signed   By: Kathreen Devoid   On: 02/18/2016 20:51   Ct Maxillofacial W Contrast  02/19/2016  CLINICAL DATA:  Poor dentition in a patient with fever. Question tooth abscess. EXAM: CT MAXILLOFACIAL WITH CONTRAST TECHNIQUE: Multidetector CT imaging of the maxillofacial structures was performed with intravenous contrast. Multiplanar CT image reconstructions were also generated. A small metallic BB was placed on the right temple in order to reliably differentiate right from left. CONTRAST:  70mL ISOVUE-300 IOPAMIDOL (ISOVUE-300) INJECTION 61% COMPARISON:  None. FINDINGS: No evidence for soft tissue abscess adjacent to the maxilla or mandible. Multiple dental  cavities are identified in the remaining teeth in numerous teeth have periapical lucency within the adjacent bony anatomy. No edema or inflammation within the visualized subcutaneous soft tissues of face. No submandibular or submental lymphadenopathy. No findings to suggest tonsillar abscess. There is some trace chronic mucosal disease in the left maxillary sinus, but the remaining visualized paranasal sinuses and mastoid air cells are clear. No evidence for fluid in the middle ear. IMPRESSION: 1. No evidence for soft tissue abscess in the region of the maxilla or mandible. 2. Lucency associated with the roots of multiple upper and lower teeth. Periapical abscess cannot be excluded for any of these teeth. Electronically Signed   By: Misty Stanley M.D.   On: 02/19/2016 12:07     Scheduled Meds: . aspirin EC  81 mg Oral Daily  . atorvastatin  80 mg Oral Daily  . enoxaparin (LOVENOX) injection  40 mg Subcutaneous Q24H  . feeding supplement (ENSURE ENLIVE)  237 mL Oral BID BM  . fentaNYL  25 mcg Transdermal Q72H  . furosemide  40 mg Oral Daily  . levothyroxine  175 mcg Oral QAC breakfast  . lubiprostone  24 mcg Oral BID WC  . metFORMIN  500 mg Oral Daily  . piperacillin-tazobactam (ZOSYN)  IV  3.375 g Intravenous Q8H  . potassium chloride SA  20 mEq Oral BID  . vancomycin  750 mg Intravenous Q12H   Continuous Infusions: . sodium chloride 10 mL/hr at 02/19/16 0137    Marzetta Board, MD, PhD [facility] Pager [phone]  If 7PM-7AM, please contact night-coverage [email or web address] Password [email or web address] 02/20/2016, 11:50 AM

## 2016-02-20 NOTE — Telephone Encounter (Signed)
This RN received call from pt's daughter - [name] - wanting to speak with MD per current condition and prognosis of her mother who is currently admitted to the hospital.  [name] as well as other family are in [place].  [name] states she is pt's HCPOA.  Return call number given as [phone].  This note will be given to MD.

## 2016-02-20 NOTE — Progress Notes (Signed)
Initial Nutrition Assessment  DOCUMENTATION CODES:   Obesity unspecified  INTERVENTION:   -Provide Ensure Enlive po BID, each supplement provides 350 kcal and 20 grams of protein -Placed lunch order for patient -Encouraged PO intake -RD to continue to monitor  NUTRITION DIAGNOSIS:   Increased nutrient needs related to cancer and cancer related treatments as evidenced by estimated needs.  GOAL:   Patient will meet greater than or equal to 90% of their needs  MONITOR:   PO intake, Supplement acceptance, Labs, Weight trends, I & O's  REASON FOR ASSESSMENT:   Malnutrition Screening Tool    ASSESSMENT:   75 y.o. female with medical history significant of club foot, arthritis, thyroid disease, hyperlipidemia, osteoporosis, hypertension, GERD, type 2 diabetes, metastatic to bone and liver breast cancer (recently completed 3rd round of chemotherapy with cyclophosphamide, methotrexate, fluorouracil) who was brought to the emergency department via EMS due to acute AMS and fever.  Patient reports poor appetite since chemo treatments began. She states she does not like the hospital food and does not like she cannot have salt. States she felt nauseous after eating soup yesterday which has not occurred since starting treatments. Pt denies swallowing or chewing issues and taste changes. States she has mainly been eating fruit and yogurt at home with the occasional tuna salad sandwich. PO intake documented is 50% of meals. Pt would like a tuna salad sandwich for lunch, would permit this for lunch today only. RD ordered tray. Pt is also willing to try Ensure supplements, RD to order.  Per weight history, pt's weight has remained stable. However, pt is edematous in LE and UEs. Fluid may be masking any weight changes. Pt with moderate muscle depletion in clavicle region.   Medications: Lasix tablet daily, K-DUR tablet BID Labs reviewed: CBGs: 88-116  Diet Order:  Diet heart healthy/carb  modified Room service appropriate?: Yes; Fluid consistency:: Thin  Skin:  Reviewed, no issues  Last BM:  7/12  Height:   Ht Readings from Last 1 Encounters:  02/18/16 5' (1.524 m)    Weight:   Wt Readings from Last 1 Encounters:  02/18/16 169 lb (76.658 kg)    Ideal Body Weight:  45.5 kg  BMI:  Body mass index is 33.01 kg/(m^2).  Estimated Nutritional Needs:   Kcal:  1600-1800  Protein:  70-80g  Fluid:  1.6-1.8L/day  EDUCATION NEEDS:   Education needs addressed  Clayton Bibles, MS, RD, LDN Pager: [phone] After Hours Pager: [phone]

## 2016-02-21 ENCOUNTER — Other Ambulatory Visit: Payer: Self-pay | Admitting: Oncology

## 2016-02-21 ENCOUNTER — Telehealth: Payer: Self-pay | Admitting: *Deleted

## 2016-02-21 DIAGNOSIS — C50512 Malignant neoplasm of lower-outer quadrant of left female breast: Secondary | ICD-10-CM

## 2016-02-21 DIAGNOSIS — C7951 Secondary malignant neoplasm of bone: Secondary | ICD-10-CM

## 2016-02-21 LAB — CBC WITH DIFFERENTIAL/PLATELET
BAND NEUTROPHILS: 29 %
BASOS PCT: 0 %
Basophils Absolute: 0 10*3/uL (ref 0.0–0.1)
Blasts: 0 %
EOS ABS: 0.5 10*3/uL (ref 0.0–0.7)
EOS PCT: 2 %
HCT: 28.6 % — ABNORMAL LOW (ref 36.0–46.0)
Hemoglobin: 8.9 g/dL — ABNORMAL LOW (ref 12.0–15.0)
LYMPHS ABS: 0.9 10*3/uL (ref 0.7–4.0)
Lymphocytes Relative: 4 %
MCH: 27 pg (ref 26.0–34.0)
MCHC: 31.1 g/dL (ref 30.0–36.0)
MCV: 86.7 fL (ref 78.0–100.0)
METAMYELOCYTES PCT: 2 %
MONO ABS: 3.5 10*3/uL — AB (ref 0.1–1.0)
MYELOCYTES: 3 %
Monocytes Relative: 15 %
NEUTROS PCT: 45 %
NRBC: 0 /100{WBCs}
Neutro Abs: 18.7 10*3/uL — ABNORMAL HIGH (ref 1.7–7.7)
Other: 0 %
PLATELETS: 427 10*3/uL — AB (ref 150–400)
Promyelocytes Absolute: 0 %
RBC: 3.3 MIL/uL — ABNORMAL LOW (ref 3.87–5.11)
RDW: 16.4 % — AB (ref 11.5–15.5)
WBC MORPHOLOGY: INCREASED
WBC: 23.6 10*3/uL — ABNORMAL HIGH (ref 4.0–10.5)

## 2016-02-21 LAB — COMPREHENSIVE METABOLIC PANEL
ALBUMIN: 2.2 g/dL — AB (ref 3.5–5.0)
ALK PHOS: 65 U/L (ref 38–126)
ALT: 10 U/L — AB (ref 14–54)
AST: 22 U/L (ref 15–41)
Anion gap: 5 (ref 5–15)
BUN: 6 mg/dL (ref 6–20)
CHLORIDE: 107 mmol/L (ref 101–111)
CO2: 29 mmol/L (ref 22–32)
CREATININE: 0.71 mg/dL (ref 0.44–1.00)
Calcium: 8 mg/dL — ABNORMAL LOW (ref 8.9–10.3)
GFR calc Af Amer: >60 mL/min (ref >60)
GFR calc non Af Amer: >60 mL/min (ref >60)
GLUCOSE: 74 mg/dL (ref 65–99)
Potassium: 4 mmol/L (ref 3.5–5.1)
SODIUM: 141 mmol/L (ref 135–145)
Total Bilirubin: 1 mg/dL (ref 0.3–1.2)
Total Protein: 5.5 g/dL — ABNORMAL LOW (ref 6.5–8.1)

## 2016-02-21 LAB — GLUCOSE, CAPILLARY: Glucose-Capillary: 98 mg/dL (ref 65–99)

## 2016-02-21 MED ORDER — HEPARIN SOD (PORK) LOCK FLUSH 100 UNIT/ML IV SOLN
500.0000 [IU] | INTRAVENOUS | Status: AC | PRN
  Administered 2016-02-21: 500 [IU]

## 2016-02-21 MED ORDER — AMOXICILLIN-POT CLAVULANATE 875-125 MG PO TABS: 1.0000 | ORAL_TABLET | Freq: Two times a day (BID) | ORAL | Status: DC

## 2016-02-21 MED ORDER — LEVOFLOXACIN 500 MG PO TABS: 500.0000 mg | ORAL_TABLET | Freq: Every day | ORAL | Status: DC

## 2016-02-21 NOTE — Progress Notes (Signed)
Spoke with patient at bedside, per note from ED CM patient has lots of support and currently independent with ADL's, Discussed with patient about PCS through Medicaid, encourage patient to discuss with PCP and complete application asap. Patient thankful for assistance.

## 2016-02-21 NOTE — Progress Notes (Signed)
Christy Harrell   DOB:May 10, 1941   GD#:[id number]   DQQ#:[id number]  Subjective: Regnia tells me shwas "out of it" 07/11 when she was brought to the ED by EMS. She was febrile and neutropenic (0.2), was pancultured 9still negative) and started on Zosyn and vancomycin. She has since defervesced, counts have rebounded, and she is "needing to go home."-- She tells me she walks to BR and around the room w/o difficulty; ROS is nonfocal ecept for mouth problems, which are chronic. No family in room   Objective: older African American woman examined in bed Filed Vitals:   02/21/16 0440 02/21/16 0500  BP: 86/47 89/47  Pulse:    Temp: 98.8 F (37.1 C)   Resp: 16     Body mass index is 33.01 kg/(m^2).  Intake/Output Summary (Last 24 hours) at 02/21/16 0757 Last data filed at 02/20/16 1217  Gross per 24 hour  Intake    480 ml  Output      0 ml  Net    480 ml     Sclerae unicteric  Lungs no rales or wheezes--auscultated anterolaterally  Heart regular rate and rhythm  Abdomen soft, +BS  Neuro nonfocal  CBG (last 3)   Recent Labs  02/20/16 1148 02/20/16 1700 02/21/16 0741  GLUCAP 81 81 98     Labs:  Lab Results  Component Value Date   WBC 23.6* 02/21/2016   HGB 8.9* 02/21/2016   HCT 28.6* 02/21/2016   MCV 86.7 02/21/2016   PLT 427* 02/21/2016   NEUTROABS 18.7* 02/21/2016    @LASTCHEMISTRY @  Urine Studies No results for input(s): UHGB, CRYS in the last 72 hours.  Invalid input(s): UACOL, UAPR, USPG, UPH, UTP, UGL, UKET, UBIL, UNIT, UROB, ULEU, UEPI, UWBC, URBC, UBAC, CAST, [place], [place]  Basic Metabolic Panel:  Recent Labs Lab 02/18/16 2113 02/19/16 0619 02/20/16 0445 02/21/16 0410  NA 134* 135 137 141  K 3.5 3.4* 3.5 4.0  CL 99* 104 105 107  CO2 29 25 27 29   GLUCOSE 101* 160* 89 74  BUN 10 8 6 6   CREATININE 0.67 0.59 0.63 0.71  CALCIUM 8.1* 7.6* 7.6* 8.0*   GFR Estimated Creatinine Clearance: 56.5 mL/min (by C-G formula based on Cr of 0.71). Liver  Function Tests:  Recent Labs Lab 02/18/16 2113 02/19/16 0619 02/20/16 0445 02/21/16 0410  AST 40 33 24 22  ALT 13* 13* 10* 10*  ALKPHOS 58 54 55 65  BILITOT 1.0 0.8 0.7 1.0  PROT 6.2* 5.8* 5.7* 5.5*  ALBUMIN 2.6* 2.4* 2.2* 2.2*   No results for input(s): LIPASE, AMYLASE in the last 168 hours. No results for input(s): AMMONIA in the last 168 hours. Coagulation profile No results for input(s): INR, PROTIME in the last 168 hours.  CBC:  Recent Labs Lab 02/18/16 2113 02/19/16 0530 02/20/16 0445 02/21/16 0410  WBC 2.8* 2.6* 7.4 23.6*  NEUTROABS 0.2* 0.3* 3.2 18.7*  HGB 9.4* 9.3* 8.6* 8.9*  HCT 28.9* 28.9* 26.5* 28.6*  MCV 83.8 84.8 84.4 86.7  PLT 219 252 293 427*   Cardiac Enzymes: No results for input(s): CKTOTAL, CKMB, CKMBINDEX, TROPONINI in the last 168 hours. BNP: Invalid input(s): POCBNP CBG:  Recent Labs Lab 02/19/16 1631 02/20/16 0725 02/20/16 1148 02/20/16 1700 02/21/16 0741  GLUCAP 114* 88 81 81 98   D-Dimer No results for input(s): DDIMER in the last 72 hours. Hgb A1c No results for input(s): HGBA1C in the last 72 hours. Lipid Profile No results for input(s): CHOL, HDL, LDLCALC, TRIG, CHOLHDL,  LDLDIRECT in the last 72 hours. Thyroid function studies No results for input(s): TSH, T4TOTAL, T3FREE, THYROIDAB in the last 72 hours.  Invalid input(s): FREET3 Anemia work up No results for input(s): VITAMINB12, FOLATE, FERRITIN, TIBC, IRON, RETICCTPCT in the last 72 hours. Microbiology Recent Results (from the past 240 hour(s))  Blood Culture (routine x 2)     Status: None (Preliminary result)   Collection Time: 02/18/16  9:09 PM  Result Value Ref Range Status   Specimen Description PORTA CATH  Final   Special Requests BOTTLES DRAWN AEROBIC AND ANAEROBIC 5CC  Final   Culture   Final    NO GROWTH 1 DAY Performed at [facility]    Report Status PENDING  Incomplete  Urine culture     Status: Abnormal   Collection Time: 02/18/16 11:27 PM   Result Value Ref Range Status   Specimen Description URINE, CLEAN CATCH  Final   Special Requests Immunocompromised  Final   Culture MULTIPLE SPECIES PRESENT, SUGGEST RECOLLECTION (A)  Final   Report Status 02/20/2016 FINAL  Final      Studies:  Ct Maxillofacial W Contrast  02/19/2016  CLINICAL DATA:  Poor dentition in a patient with fever. Question tooth abscess. EXAM: CT MAXILLOFACIAL WITH CONTRAST TECHNIQUE: Multidetector CT imaging of the maxillofacial structures was performed with intravenous contrast. Multiplanar CT image reconstructions were also generated. A small metallic BB was placed on the right temple in order to reliably differentiate right from left. CONTRAST:  30m ISOVUE-300 IOPAMIDOL (ISOVUE-300) INJECTION 61% COMPARISON:  None. FINDINGS: No evidence for soft tissue abscess adjacent to the maxilla or mandible. Multiple dental cavities are identified in the remaining teeth in numerous teeth have periapical lucency within the adjacent bony anatomy. No edema or inflammation within the visualized subcutaneous soft tissues of face. No submandibular or submental lymphadenopathy. No findings to suggest tonsillar abscess. There is some trace chronic mucosal disease in the left maxillary sinus, but the remaining visualized paranasal sinuses and mastoid air cells are clear. No evidence for fluid in the middle ear. IMPRESSION: 1. No evidence for soft tissue abscess in the region of the maxilla or mandible. 2. Lucency associated with the roots of multiple upper and lower teeth. Periapical abscess cannot be excluded for any of these teeth. Electronically Signed   By: EMisty StanleyM.D.   On: 02/19/2016 12:07    Assessment: 75y.o. [place] woman with stage IV [bone only] breast cancer as of May 2014  (1) status post right lower outer quadrant lumpectomy and axillary lymph node dissection 11/17/2010 for a pT2 pN1, stage IIB invasive ductal carcinoma, grade 2, estrogen and progesterone  receptor positive, HER-2 negative,   (2) Oncotype DX recurrence score of 27 predicting a risk of distant recurrence of 18% with 5 years of tamoxifen (intermediate score);   (3) status post radiation completed July of 2012,   (4) started anastrozole July 2012, discontinued June 2014 with evidence of metastatic spread to bone  (5) Chronic lymphedema in the right upper extremity.  (6) pathologic fracture of the left humerus along apparent lytic lesion, with no other bone lesions per bone scan 12/12/2012  (7) on 12/27/2012 underwent #1 intramedullary nail left pathologic proximal humerus fracture  #2 left shoulder rotator cuff repair  #3 left shoulder open bone biopsy with pathology showing metastatic breast adenocarcinoma, estrogen receptor 100% positive, progesterone receptor and HER-2 negative, with an MIB-1 of 26%. PET scan 01/13/2013 showed no other areas of disease   (8) status post  35 Gy to the left proximal humerus completed 03/13/2013(  (9) fulvestrant, started on 01/23/2013, stopped May 2017 with progression  (10) zolendronic acid given monthly, first dose 01/23/2013; changed to denosumab as of August 2015 because of access problems, being given every three months (a) May 2017 dose help after several extractions  (11) multiple liver lesions noted on scans April 2015  (12) letrozole added May 2015, stopped 04/18/14 because of progression in her liver noted on a chest CT performed on 03/14/14  (13) everolimus and exemestane started 04/19/14, stopped May 2017 (a) MRI of the abdomen 07/12/15 showed stable disease (b) repeat abdominal MRI 12/04/2015 showed progression  (14) started CMF chemotherapy (cyclophosphamide, methotrexate, fluorouracil) 12/24/2015, most recent dose 02/04/2016, did not receive neulasta    Plan:  Sherlin is agitating to go home; She is now afebrile. Blood cultures are only one day old but so far negative.  Source is most likely dental. She now has a vigorous WBC.  I am comfortable with her going home today. Suggest double coverage with levaquin plus augmentin x 5 d. She has a niece and nephew locally that will be helping this weekend. I am seeing her 02/25/2016 and we will review her situation then.  She knows to call us with any problems that may develop before that visit  Appreciate your help to Ms Buckels!   Chauncey Cruel, MD 02/21/2016  7:57 AM Medical Oncology and Hematology [facility] [address] [place], [place] [address] Tel. [phone]    Fax. [phone]

## 2016-02-21 NOTE — Progress Notes (Signed)
Went over d/c instructions.  Patient verbalized understanding.  Left hospital via w/c with all belongings and hard scripts.  Virginia Rochester, RN

## 2016-02-21 NOTE — Telephone Encounter (Signed)
Pt was on TCM list admitted for altered mental status, breast cancer. Pt d/c 7/14, and will be f/u w/Dr. [name] in 4 days...Christy Harrell

## 2016-02-21 NOTE — Discharge Summary (Signed)
Physician Discharge Summary  Christy Harrell [id number] DOB: [birth year] DOA: 02/18/2016  PCP: [name], MD  Admit date: 02/18/2016 Discharge date: 02/21/2016  Admitted From: home Disposition:  home  Recommendations for Outpatient Follow-up:  1. Follow up with Dr. [name] in 4 days as scheduled 2. Discharge on Augmentin and Levaquin for 4 additional days  Discharge Condition: stable CODE STATUS: Full Diet recommendation: regular  HPI: 75 y.o. female with medical history significant of club foot, arthritis, thyroid disease, hyperlipidemia, osteoporosis, hypertension, GERD, type 2 diabetes, metastatic to bone and liver breast cancer (recently completed 3rd round of chemotherapy with cyclophosphamide, methotrexate, fluorouracil) who was brought to the emergency department via EMS due to acute AMS and fever.  Hospital Course: Discharge Diagnoses:  Principal Problem:   Altered mental status Active Problems:   Type 2 diabetes, uncontrolled, with neuropathy (HCC)   Hypothyroidism   Lymphedema of upper extremity following lymphadenectomy   Breast cancer of lower-outer quadrant of left female breast ([place])   UTI (urinary tract infection)   Anemia  Neutropenic fever / sepsis - ANC 0.3 on admission, febrile. Patient was started on broad spectrum antibiotics with improvement in her fever and sepsis physiology resolved. She was administered Granix to help with her pancytopenia with improvement in her WBC. Patient has remained afebrile, her neutropenia resolved. Source for her fever is not clear, her urine culture showed multiple species which may suggest a UTI. Other cultures have remained negative. She does have chronic right jay pain for which she underwent a CT maxillofacial without abscess / lymphadenopathy to suggest an active infection. I discussed with Dr. Shelba Flake on the day of discharge, he will see patient in 4 days and recommended empiric Augmentin and Levaquin until then at  least.  Acute encephalopathy - in the setting of fever, resolved  UTI (urinary tract infection) - cultures without significant growth Type 2 diabetes, uncontrolled, with neuropathy ([place]) - Continue metformin. Hypothyroidism - Continue levothyroxine 175 g by mouth daily. Lymphedema of upper extremity following lymphadenectomy  Breast cancer of lower-outer quadrant of left female breast [facility])- Continue treatment and follow-up as per oncology team. Anemia - Monitor hematocrit and hemoglobin closely. Stable   Discharge Instructions     Medication List    STOP taking these medications        clotrimazole-betamethasone cream  Commonly known as:  LOTRISONE     fluocinonide-emollient 0.05 % cream  Commonly known as:  LIDEX-E     ketoconazole 2 % cream  Commonly known as:  NIZORAL     LORazepam 0.5 MG tablet  Commonly known as:  ATIVAN      TAKE these medications        amoxicillin-clavulanate 875-125 MG tablet  Commonly known as:  AUGMENTIN  Take 1 tablet by mouth 2 (two) times daily.     aspirin EC 81 MG tablet  Take 1 tablet (81 mg total) by mouth daily.     atorvastatin 80 MG tablet  Commonly known as:  LIPITOR  Take 1 tablet (80 mg total) by mouth daily.     docusate sodium 100 MG capsule  Commonly known as:  COLACE  Take 1 capsule (100 mg total) by mouth 2 (two) times daily as needed. Reported on 10/17/2015     doxepin 25 MG capsule  Commonly known as:  SINEQUAN  Take 2 capsules (50 mg total) by mouth at bedtime as needed.     fentaNYL 25 MCG/HR patch  Commonly known as:  DURAGESIC - dosed  mcg/hr  Place 1 patch (25 mcg total) onto the skin every 3 (three) days.     furosemide 40 MG tablet  Commonly known as:  LASIX  Take 1 tablet (40 mg total) by mouth daily.     levofloxacin 500 MG tablet  Commonly known as:  LEVAQUIN  Take 1 tablet (500 mg total) by mouth daily.     levothyroxine 175 MCG tablet  Commonly known as:  SYNTHROID  Take 1 tablet (175 mcg  total) by mouth daily before breakfast.     lidocaine-prilocaine cream  Commonly known as:  EMLA  Apply to affected area once     lubiprostone 24 MCG capsule  Commonly known as:  AMITIZA  Take 1 capsule (24 mcg total) by mouth 2 (two) times daily with a meal.     magic mouthwash Soln  Take 5 mLs by mouth 4 (four) times daily as needed for mouth pain.     metFORMIN 500 MG tablet  Commonly known as:  GLUCOPHAGE  Take 1 tablet (500 mg total) by mouth daily.     metoCLOPramide 5 MG tablet  Commonly known as:  REGLAN  Take 1 tablet (5 mg total) by mouth every 8 (eight) hours as needed for nausea.     ondansetron 8 MG tablet  Commonly known as:  ZOFRAN  Take twice a day starting the evenng of the day of chemo and continuing 2 full days     oxyCODONE 5 MG immediate release tablet  Commonly known as:  Oxy IR/ROXICODONE  Take 1-2 tablets (5-10 mg total) by mouth every 6 (six) hours as needed for severe pain.     polyethylene glycol powder powder  Commonly known as:  GLYCOLAX/MIRALAX  Take 255 g (1 Container total) by mouth daily.     potassium chloride SA 20 MEQ tablet  Commonly known as:  K-DUR,KLOR-CON  Take 1 tablet (20 mEq total) by mouth 2 (two) times daily.     pregabalin 75 MG capsule  Commonly known as:  LYRICA  Take 1 capsule (75 mg total) by mouth 2 (two) times daily. Not taking     prochlorperazine 10 MG tablet  Commonly known as:  COMPAZINE  Take one tablet three times a day before meals tarting the evening of chemo and continuing for an additional two days           Follow-up Information    Follow up with Chauncey Cruel, MD In 4 days.   Specialty:  Oncology   Contact information:   [place] [address] [phone]      Allergies  Allergen Reactions  . Bydureon [Exenatide] Rash  . Gadolinium Derivatives Other (See Comments)    Pt began having chest numbness/tingling , stated her heart felt funny, had to take her to ED for EKG  per RN   CAP  . Enalapril Rash    Consultations:  Oncology   Procedures/Studies:  None   Dg Chest 2 View  02/18/2016  CLINICAL DATA:  Altered mental status, fever EXAM: CHEST  2 VIEW COMPARISON:  05/23/2015 FINDINGS: There is a left-sided Port-A-Cath with the tip projecting over the SVC. There is no focal parenchymal opacity. There is no pleural effusion or pneumothorax. The heart and mediastinal contours are unremarkable. Prior proximal left humeral ORIF. Moderate osteoarthritis of the right glenohumeral joint. IMPRESSION: No active cardiopulmonary disease. Electronically Signed   By: Kathreen Devoid   On: 02/18/2016 20:51   [place]  02/19/2016  CLINICAL DATA:  Poor dentition in a patient with fever. Question tooth abscess. EXAM: CT MAXILLOFACIAL WITH CONTRAST TECHNIQUE: Multidetector CT imaging of the maxillofacial structures was performed with intravenous contrast. Multiplanar CT image reconstructions were also generated. A small metallic BB was placed on the right temple in order to reliably differentiate right from left. CONTRAST:  61mL ISOVUE-300 IOPAMIDOL (ISOVUE-300) INJECTION 61% COMPARISON:  None. FINDINGS: No evidence for soft tissue abscess adjacent to the maxilla or mandible. Multiple dental cavities are identified in the remaining teeth in numerous teeth have periapical lucency within the adjacent bony anatomy. No edema or inflammation within the visualized subcutaneous soft tissues of face. No submandibular or submental lymphadenopathy. No findings to suggest tonsillar abscess. There is some trace chronic mucosal disease in the left maxillary sinus, but the remaining visualized paranasal sinuses and mastoid air cells are clear. No evidence for fluid in the middle ear. IMPRESSION: 1. No evidence for soft tissue abscess in the region of the maxilla or mandible. 2. Lucency associated with the roots of multiple upper and lower teeth. Periapical abscess cannot be excluded  for any of these teeth. Electronically Signed   By: Misty Stanley M.D.   On: 02/19/2016 12:07      Subjective: - no chest pain, shortness of breath, no abdominal pain, nausea or vomiting.  - asking to go home   Discharge Exam: Filed Vitals:   02/21/16 0440 02/21/16 0500  BP: 86/47 89/47  Pulse:    Temp: 98.8 F (37.1 C)   Resp: 16    Filed Vitals:   02/20/16 1623 02/20/16 2126 02/21/16 0440 02/21/16 0500  BP: 98/52 103/43 86/47 89/47   Pulse:      Temp:  98.7 F (37.1 C) 98.8 F (37.1 C)   TempSrc:  Oral Oral   Resp:  18 16   Height:      Weight:      SpO2:  100% 98%     General: Pt is alert, awake, not in acute distress Cardiovascular: RRR, S1/S2 +, no rubs, no gallops Respiratory: CTA bilaterally, no wheezing, no rhonchi Abdominal: Soft, NT, ND, bowel sounds + Extremities: no edema, no cyanosis    The results of significant diagnostics from this hospitalization (including imaging, microbiology, ancillary and laboratory) are listed below for reference.     Microbiology: Recent Results (from the past 240 hour(s))  Blood Culture (routine x 2)     Status: None (Preliminary result)   Collection Time: 02/18/16  9:09 PM  Result Value Ref Range Status   Specimen Description PORTA CATH  Final   Special Requests BOTTLES DRAWN AEROBIC AND ANAEROBIC 5CC  Final   Culture   Final    NO GROWTH 1 DAY Performed at [facility]    Report Status PENDING  Incomplete  Urine culture     Status: Abnormal   Collection Time: 02/18/16 11:27 PM  Result Value Ref Range Status   Specimen Description URINE, CLEAN CATCH  Final   Special Requests Immunocompromised  Final   Culture MULTIPLE SPECIES PRESENT, SUGGEST RECOLLECTION (A)  Final   Report Status 02/20/2016 FINAL  Final     Labs: BNP (last 3 results) No results for input(s): BNP in the last 8760 hours. Basic Metabolic Panel:  Recent Labs Lab 02/18/16 2113 02/19/16 0619 02/20/16 0445 02/21/16 0410  NA 134* 135  137 141  K 3.5 3.4* 3.5 4.0  CL 99* 104 105 107  CO2 29 25 27 29   GLUCOSE 101*  160* 89 74  BUN 10 8 6 6   CREATININE 0.67 0.59 0.63 0.71  CALCIUM 8.1* 7.6* 7.6* 8.0*   Liver Function Tests:  Recent Labs Lab 02/18/16 2113 02/19/16 0619 02/20/16 0445 02/21/16 0410  AST 40 33 24 22  ALT 13* 13* 10* 10*  ALKPHOS 58 54 55 65  BILITOT 1.0 0.8 0.7 1.0  PROT 6.2* 5.8* 5.7* 5.5*  ALBUMIN 2.6* 2.4* 2.2* 2.2*   No results for input(s): LIPASE, AMYLASE in the last 168 hours. No results for input(s): AMMONIA in the last 168 hours. CBC:  Recent Labs Lab 02/18/16 2113 02/19/16 0530 02/20/16 0445 02/21/16 0410  WBC 2.8* 2.6* 7.4 23.6*  NEUTROABS 0.2* 0.3* 3.2 18.7*  HGB 9.4* 9.3* 8.6* 8.9*  HCT 28.9* 28.9* 26.5* 28.6*  MCV 83.8 84.8 84.4 86.7  PLT 219 252 293 427*   Cardiac Enzymes: No results for input(s): CKTOTAL, CKMB, CKMBINDEX, TROPONINI in the last 168 hours. BNP: Invalid input(s): POCBNP CBG:  Recent Labs Lab 02/19/16 1631 02/20/16 0725 02/20/16 1148 02/20/16 1700 02/21/16 0741  GLUCAP 114* 88 81 81 98   D-Dimer No results for input(s): DDIMER in the last 72 hours. Hgb A1c No results for input(s): HGBA1C in the last 72 hours. Lipid Profile No results for input(s): CHOL, HDL, LDLCALC, TRIG, CHOLHDL, LDLDIRECT in the last 72 hours. Thyroid function studies No results for input(s): TSH, T4TOTAL, T3FREE, THYROIDAB in the last 72 hours.  Invalid input(s): FREET3 Anemia work up No results for input(s): VITAMINB12, FOLATE, FERRITIN, TIBC, IRON, RETICCTPCT in the last 72 hours. Urinalysis    Component Value Date/Time   COLORURINE YELLOW 02/18/2016 2327   APPEARANCEUR CLOUDY* 02/18/2016 2327   LABSPEC 1.007 02/18/2016 2327   PHURINE 6.5 02/18/2016 2327   GLUCOSEU NEGATIVE 02/18/2016 2327   GLUCOSEU NEGATIVE 10/19/2014 1246   HGBUR SMALL* 02/18/2016 2327   BILIRUBINUR NEGATIVE 02/18/2016 2327   KETONESUR NEGATIVE 02/18/2016 2327   PROTEINUR 30* 02/18/2016  2327   UROBILINOGEN 1.0 10/19/2014 1246   NITRITE NEGATIVE 02/18/2016 2327   LEUKOCYTESUR LARGE* 02/18/2016 2327   Sepsis Labs Invalid input(s): PROCALCITONIN,  WBC,  LACTICIDVEN Microbiology Recent Results (from the past 240 hour(s))  Blood Culture (routine x 2)     Status: None (Preliminary result)   Collection Time: 02/18/16  9:09 PM  Result Value Ref Range Status   Specimen Description PORTA CATH  Final   Special Requests BOTTLES DRAWN AEROBIC AND ANAEROBIC 5CC  Final   Culture   Final    NO GROWTH 1 DAY Performed at [facility]    Report Status PENDING  Incomplete  Urine culture     Status: Abnormal   Collection Time: 02/18/16 11:27 PM  Result Value Ref Range Status   Specimen Description URINE, CLEAN CATCH  Final   Special Requests Immunocompromised  Final   Culture MULTIPLE SPECIES PRESENT, SUGGEST RECOLLECTION (A)  Final   Report Status 02/20/2016 FINAL  Final     Time coordinating discharge: Over 30 minutes  SIGNED:  Marzetta Board, MD  [facility] 02/21/2016, 12:16 PM Pager [phone]  If 7PM-7AM, please contact night-coverage [email or web address] Password [email or web address]

## 2016-02-21 NOTE — Discharge Instructions (Signed)
Follow with Dr. [name] in 4 days as scheduled  Please get a complete blood count and chemistry panel checked by your Primary MD at your next visit, and again as instructed by your Primary MD. Please get your medications reviewed and adjusted by your Primary MD.  Please request your Primary MD to go over all Hospital Tests and Procedure/Radiological results at the follow up, please get all Hospital records sent to your Prim MD by signing hospital release before you go home.  If you had Pneumonia of Lung problems at the Hospital: Please get a 2 view Chest X ray done in 6-8 weeks after hospital discharge or sooner if instructed by your Primary MD.  If you have Congestive Heart Failure: Please call your Cardiologist or Primary MD anytime you have any of the following symptoms:  1) 3 pound weight gain in 24 hours or 5 pounds in 1 week  2) shortness of breath, with or without a dry hacking cough  3) swelling in the hands, feet or stomach  4) if you have to sleep on extra pillows at night in order to breathe  Follow cardiac low salt diet and 1.5 lit/day fluid restriction.  If you have diabetes Accuchecks 4 times/day, Once in AM empty stomach and then before each meal. Log in all results and show them to your primary doctor at your next visit. If any glucose reading is under 80 or above 300 call your primary MD immediately.  If you have Seizure/Convulsions/Epilepsy: Please do not drive, operate heavy machinery, participate in activities at heights or participate in high speed sports until you have seen by Primary MD or a Neurologist and advised to do so again.  If you had Gastrointestinal Bleeding: Please ask your Primary MD to check a complete blood count within one week of discharge or at your next visit. Your endoscopic/colonoscopic biopsies that are pending at the time of discharge, will also need to followed by your Primary MD.  Get Medicines reviewed and adjusted. Please take all your  medications with you for your next visit with your Primary MD  Please request your Primary MD to go over all hospital tests and procedure/radiological results at the follow up, please ask your Primary MD to get all Hospital records sent to his/her office.  If you experience worsening of your admission symptoms, develop shortness of breath, life threatening emergency, suicidal or homicidal thoughts you must seek medical attention immediately by calling 911 or calling your MD immediately  if symptoms less severe.  You must read complete instructions/literature along with all the possible adverse reactions/side effects for all the Medicines you take and that have been prescribed to you. Take any new Medicines after you have completely understood and accpet all the possible adverse reactions/side effects.   Do not drive or operate heavy machinery when taking Pain medications.   Do not take more than prescribed Pain, Sleep and Anxiety Medications  Special Instructions: If you have smoked or chewed Tobacco  in the last 2 yrs please stop smoking, stop any regular Alcohol  and or any Recreational drug use.  Wear Seat belts while driving.  Please note You were cared for by a hospitalist during your hospital stay. If you have any questions about your discharge medications or the care you received while you were in the hospital after you are discharged, you can call the unit and asked to speak with the hospitalist on call if the hospitalist that took care of you is not available.  Once you are discharged, your primary care physician will handle any further medical issues. Please note that NO REFILLS for any discharge medications will be authorized once you are discharged, as it is imperative that you return to your primary care physician (or establish a relationship with a primary care physician if you do not have one) for your aftercare needs so that they can reassess your need for medications and monitor your  lab values.  You can reach the hospitalist office at phone [phone] or fax [phone]   If you do not have a primary care physician, you can call [phone] for a physician referral.  Activity: As tolerated with Full fall precautions use walker/cane & assistance as needed  Diet: regular  Disposition Home

## 2016-02-24 LAB — CULTURE, BLOOD (ROUTINE X 2): CULTURE: NO GROWTH

## 2016-02-25 ENCOUNTER — Other Ambulatory Visit (HOSPITAL_BASED_OUTPATIENT_CLINIC_OR_DEPARTMENT_OTHER): Payer: Medicare Other

## 2016-02-25 ENCOUNTER — Ambulatory Visit (HOSPITAL_BASED_OUTPATIENT_CLINIC_OR_DEPARTMENT_OTHER): Payer: Medicare Other

## 2016-02-25 ENCOUNTER — Ambulatory Visit: Payer: Medicare Other

## 2016-02-25 ENCOUNTER — Ambulatory Visit (HOSPITAL_BASED_OUTPATIENT_CLINIC_OR_DEPARTMENT_OTHER): Payer: Medicare Other | Admitting: Oncology

## 2016-02-25 VITALS — BP 114/90 | HR 79 | Temp 98.1°F | Resp 18 | Ht 60.0 in | Wt 159.0 lb

## 2016-02-25 DIAGNOSIS — C50512 Malignant neoplasm of lower-outer quadrant of left female breast: Secondary | ICD-10-CM

## 2016-02-25 DIAGNOSIS — Z95828 Presence of other vascular implants and grafts: Secondary | ICD-10-CM

## 2016-02-25 DIAGNOSIS — C7951 Secondary malignant neoplasm of bone: Secondary | ICD-10-CM

## 2016-02-25 DIAGNOSIS — C787 Secondary malignant neoplasm of liver and intrahepatic bile duct: Secondary | ICD-10-CM

## 2016-02-25 LAB — CBC WITH DIFFERENTIAL/PLATELET
BASO%: 1.1 % (ref 0.0–2.0)
BASOS ABS: 0.2 10*3/uL — AB (ref 0.0–0.1)
EOS%: 0.6 % (ref 0.0–7.0)
Eosinophils Absolute: 0.1 10*3/uL (ref 0.0–0.5)
HEMATOCRIT: 31.5 % — AB (ref 34.8–46.6)
HEMOGLOBIN: 10.1 g/dL — AB (ref 11.6–15.9)
LYMPH#: 2.4 10*3/uL (ref 0.9–3.3)
LYMPH%: 14.2 % (ref 14.0–49.7)
MCH: 27.2 pg (ref 25.1–34.0)
MCHC: 32.1 g/dL (ref 31.5–36.0)
MCV: 84.7 fL (ref 79.5–101.0)
MONO#: 1.5 10*3/uL — ABNORMAL HIGH (ref 0.1–0.9)
MONO%: 8.6 % (ref 0.0–14.0)
NEUT#: 12.9 10*3/uL — ABNORMAL HIGH (ref 1.5–6.5)
NEUT%: 75.5 % (ref 38.4–76.8)
Platelets: 320 10*3/uL (ref 145–400)
RBC: 3.72 10*6/uL (ref 3.70–5.45)
RDW: 17.5 % — AB (ref 11.2–14.5)
WBC: 17.1 10*3/uL — ABNORMAL HIGH (ref 3.9–10.3)

## 2016-02-25 LAB — COMPREHENSIVE METABOLIC PANEL
ALBUMIN: 2.6 g/dL — AB (ref 3.5–5.0)
ALT: 9 U/L (ref 0–55)
AST: 22 U/L (ref 5–34)
Alkaline Phosphatase: 88 U/L (ref 40–150)
Anion Gap: 9 mEq/L (ref 3–11)
BILIRUBIN TOTAL: 0.33 mg/dL (ref 0.20–1.20)
BUN: 9.6 mg/dL (ref 7.0–26.0)
CALCIUM: 8.6 mg/dL (ref 8.4–10.4)
CO2: 26 mEq/L (ref 22–29)
Chloride: 106 mEq/L (ref 98–109)
Creatinine: 0.8 mg/dL (ref 0.6–1.1)
EGFR: 83 mL/min/{1.73_m2} — ABNORMAL LOW (ref >90)
Glucose: 92 mg/dl (ref 70–140)
POTASSIUM: 3.5 meq/L (ref 3.5–5.1)
Sodium: 141 mEq/L (ref 136–145)
Total Protein: 6.5 g/dL (ref 6.4–8.3)

## 2016-02-25 MED ORDER — SODIUM CHLORIDE 0.9 % IJ SOLN
10.0000 mL | INTRAMUSCULAR | Status: DC | PRN
  Administered 2016-02-25: 10 mL via INTRAVENOUS
  Filled 2016-02-25: qty 10

## 2016-02-25 NOTE — Progress Notes (Signed)
ID: Christy Harrell   DOB: 10-04-1940  MR#: [id number]  WUJ#:[id number]  PCP: Christy Calico, MD GYN:  SU:  OTHER MD:  Christy Harrell, Christy Harrell, Christy Harrell DDS  CC: Breast cancer stage IV  TREATMENT: CMF, [denosumab]  BREAST CANCER HISTORY: From the original intake note:  The patient had routine screening mammography 10/16/2010 showing a lobulated mass in the right subareolar region measuring up to 5.5 cm. The Left breast showed some central microcalcifications. Biopsy of the Right breast mass on 10/28/2010 showed an invasive ductal carcinoma, grade 2, estrogen receptor positive (Allred score 8), progesterone receptor positive (Allred score 5), with an equivocal HER-2. The Left breast area of microcalcifications was biopsied at the same time, and was read as suspicious for DCIS.  On 11/17/2010 the patient underwent Right lumpectomy and axillary lymph node dissection for what proved to be a 3 cm invasive ductal carcinoma, grade 2, with some papillary and mucinous features. One of 16 lymph nodes was involved. FISH showed no HER-2 amplification. Left breast biopsy was benign.  The patient had an Oncotype sent, with a score of 27, predicting a risk of distant recurrence after 5 years of tamoxifen in the 18% range. With this intermediate result, the decision was made not to proceed with chemotherapy, since the patient is the sole caregiver to her very elderly mother. Instead the patient proceeded to radiation treatment which was completed 03/06/2011. She started anastrozole at that point. Her subsequent history is as detailed below   INTERVAL HISTORY: Christy Harrell returns today for follow up of her estrogen receptor positive stage IV breast cancer. Today is day 1 cycle 4 of cyclophosphamide, methotrexate, and fluorouracil. However after the third cycle this alone he was admitted, on 02/18/2016, with febrile neutropenia. The cultures remain  negative. She was found to have no obvious soft tissue mouth abscesses, but lucencies at the roots of many teeth suggesting possible infection there. She was treated with Levaquin initially and then Augmentin, on which she continues. That has significantly improve her jaw pain and she has become afebrile. Of course her counts have since resolved. Note that she did receive growth factors during the hospitalization.   REVIEW OF SYSTEMS: Christy Harrell is feeling back to normal. She still has some jaw pain however. She has a bit of a runny nose and a sore throat. Her appetite is poor. Things don't taste right. She has not developed any GI problems from the antibiotics. She has baseline arthritis issues, baseline diabetes, and of course chronic right upper extremity lymphedema. A detailed review of systems today was otherwise stable.  PAST MEDICAL HISTORY: Past Medical History  Diagnosis Date  . Club foot   . Arthritis   . Thyroid disease   . Hyperlipidemia   . Osteoporosis   . Hypertension   . GERD (gastroesophageal reflux disease)     watches diet  . Neuromuscular disorder (HCC)     lt arm numb sometimes  . Wears glasses   . History of radiation therapy 02/22/13- 03/13/13    left proximal humerus 3500 cGy 14 sessions  . Lymphedema     right arm  . Diabetes mellitus     metformin  . Cancer [facility])     right breast  . 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 Metastasis from malignant tumor of breast [facility])     liver    PAST SURGICAL HISTORY: Past Surgical History  Procedure Laterality Date  . Cesarean section    . Thyroidectomy  2002  . Foot surgery      as a child for club foot  . Breast surgery  2012    rt lump-16 nodes-in [place]  . Cesarean section    . Colonoscopy    . Dilation and curettage of uterus    . Humerus im nail Left 12/27/2012    Procedure: INTRAMEDULLARY (IM) NAIL HUMERAL LEFT PATHOLOGIC FRACTURE;  Surgeon: Christy Sells, MD;   Location: [facility];  Service: Orthopedics;  Laterality: Left;    FAMILY HISTORY Family History  Problem Relation Age of Onset  . Cancer Neg Hx   . Arthritis Neg Hx   . Heart disease Neg Hx   . Hyperlipidemia Neg Hx   . Hypertension Neg Hx   . Dementia Mother   . Hearing loss Mother    The patient's father died from unknown causes. The patient's mother is alive at age 26. The patient was a single child. There is no history of breast or ovarian cancer in the family to her knowledge.  GYNECOLOGIC HISTORY: Menarche age 34, menopause in her early 48s. The patient is GX P1, first pregnancy to term age 23. She did not take hormone replacement  SOCIAL HISTORY: The patient grew up in [place], attended [employer], then moved to [place] where she lived about 40 years. 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 Her mother, Christy Harrell away at 100, on August 30, 2014. The patient's daughter Christy Harrell, 97, is disabled secondary to pulmonary hypertension. She can be reached at [phone]. The patient has 2 grandchildren, Christy Harrell who works at a Scientist, physiological business and is 75 years old, in general, 85, completing high school.   ADVANCED DIRECTIVES:not in place  HEALTH MAINTENANCE: Social History  Substance Use Topics  . Smoking status: Former Smoker    Quit date: 12/23/1974  . Smokeless tobacco: Never Used  . Alcohol Use: No     Colonoscopy:  PAP:    Bone density:  2012, on ibandronate chronically  Lipid panel: UTD  Allergies  Allergen Reactions  . Bydureon [Exenatide] Rash  . Gadolinium Derivatives Other (See Comments)    Pt began having chest numbness/tingling , stated her heart felt funny, had to take her to ED for EKG per RN   CAP  . Enalapril Rash    Current Outpatient Prescriptions  Medication Sig Dispense Refill  . amoxicillin-clavulanate (AUGMENTIN) 875-125 MG tablet Take 1 tablet by mouth 2 (two) times daily. 8  tablet 0  . aspirin EC 81 MG tablet Take 1 tablet (81 mg total) by mouth daily.    Marland Kitchen atorvastatin (LIPITOR) 80 MG tablet Take 1 tablet (80 mg total) by mouth daily. 30 tablet 11  . docusate sodium (COLACE) 100 MG capsule Take 1 capsule (100 mg total) by mouth 2 (two) times daily as needed. Reported on 10/17/2015 (Patient taking differently: Take 100 mg by mouth 2 (two) times daily as needed for mild constipation. Reported on 10/17/2015) 10 capsule 0  . doxepin (SINEQUAN) 25 MG capsule Take 2 capsules (50 mg total) by mouth at bedtime as needed. (Patient taking differently: Take 50 mg by mouth at bedtime as needed (sleep). ) 180 capsule 1  . fentaNYL (DURAGESIC - DOSED MCG/HR) 25 MCG/HR patch Place 1 patch (25 mcg total) onto the skin every 3 (three) days. 5 patch 0  . furosemide (LASIX) 40 MG tablet Take  1 tablet (40 mg total) by mouth daily. 90 tablet 2  . levofloxacin (LEVAQUIN) 500 MG tablet Take 1 tablet (500 mg total) by mouth daily. 4 tablet 0  . levothyroxine (SYNTHROID) 175 MCG tablet Take 1 tablet (175 mcg total) by mouth daily before breakfast. 90 tablet 1  . lidocaine-prilocaine (EMLA) cream Apply to affected area once 30 g 3  . lubiprostone (AMITIZA) 24 MCG capsule Take 1 capsule (24 mcg total) by mouth 2 (two) times daily with a meal. (Patient taking differently: Take 24 mcg by mouth 2 (two) times daily as needed for constipation. ) 180 capsule 1  . magic mouthwash SOLN Take 5 mLs by mouth 4 (four) times daily as needed for mouth pain. 240 mL 3  . metFORMIN (GLUCOPHAGE) 500 MG tablet Take 1 tablet (500 mg total) by mouth daily. 90 tablet 1  . metoCLOPramide (REGLAN) 5 MG tablet Take 1 tablet (5 mg total) by mouth every 8 (eight) hours as needed for nausea. (Patient not taking: Reported on 02/18/2016) 90 tablet 12  . ondansetron (ZOFRAN) 8 MG tablet Take twice a day starting the evenng of the day of chemo and continuing 2 full days (Patient not taking: Reported on 02/18/2016) 40 tablet 0  .  oxyCODONE (OXY IR/ROXICODONE) 5 MG immediate release tablet Take 1-2 tablets (5-10 mg total) by mouth every 6 (six) hours as needed for severe pain. 100 tablet 0  . polyethylene glycol powder (GLYCOLAX/MIRALAX) powder Take 255 g (1 Container total) by mouth daily. (Patient taking differently: Take 1 Container by mouth daily as needed for mild constipation or moderate constipation. ) 255 g 3  . potassium chloride SA (K-DUR,KLOR-CON) 20 MEQ tablet Take 1 tablet (20 mEq total) by mouth 2 (two) times daily. 180 tablet 1  . pregabalin (LYRICA) 75 MG capsule Take 1 capsule (75 mg total) by mouth 2 (two) times daily. Not taking (Patient taking differently: Take 75 mg by mouth 2 (two) times daily as needed (bone pain). Not taking) 60 capsule 5  . prochlorperazine (COMPAZINE) 10 MG tablet Take one tablet three times a day before meals tarting the evening of chemo and continuing for an additional two days (Patient not taking: Reported on 02/18/2016) 30 tablet 0   No current facility-administered medications for this visit.   Facility-Administered Medications Ordered in Other Visits  Medication Dose Route Frequency Provider Last Rate Last Dose  . sodium chloride 0.9 % injection 10 mL  10 mL Intravenous PRN Chauncey Cruel, MD   10 mL at 02/25/16 1108   PHYSICAL EXAM: Elderly African American woman Who appears stated age 75 Vitals:   02/25/16 1140  BP: 114/90  Pulse: 79  Temp: 98.1 F (36.7 C)  Resp: 18   Body mass index is 31.05 kg/(m^2).    ECOG FS: 2 Filed Weights   02/25/16 1140  Weight: 159 lb (72.122 kg)   Sclerae unicteric, pupils round and equal Oropharynx clear and moist-- no thrush or other lesions No cervical or supraclavicular adenopathy Lungs no rales or rhonchi Heart regular rate and rhythm Abd soft, nontender, positive bowel sounds MSK no focal spinal tenderness, chronic right upper extremity lymphedema Neuro: nonfocal, well oriented, appropriate affect Breasts:  Deferred    LAB RESULTS: Lab Results  Component Value Date   WBC 17.1* 02/25/2016   NEUTROABS 12.9* 02/25/2016   HGB 10.1* 02/25/2016   HCT 31.5* 02/25/2016   MCV 84.7 02/25/2016   PLT 320 02/25/2016      Chemistry  Component Value Date/Time   NA 141 02/25/2016 1100   NA 141 02/21/2016 0410   K 3.5 02/25/2016 1100   K 4.0 02/21/2016 0410   CL 107 02/21/2016 0410   CL 105 01/23/2013 1337   CO2 26 02/25/2016 1100   CO2 29 02/21/2016 0410   BUN 9.6 02/25/2016 1100   BUN 6 02/21/2016 0410   CREATININE 0.8 02/25/2016 1100   CREATININE 0.71 02/21/2016 0410      Component Value Date/Time   CALCIUM 8.6 02/25/2016 1100   CALCIUM 8.0* 02/21/2016 0410   ALKPHOS 88 02/25/2016 1100   ALKPHOS 65 02/21/2016 0410   AST 22 02/25/2016 1100   AST 22 02/21/2016 0410   ALT 9 02/25/2016 1100   ALT 10* 02/21/2016 0410   BILITOT 0.33 02/25/2016 1100   BILITOT 1.0 02/21/2016 0410       Lab Results  Component Value Date   LABCA2 24 11/19/2011    STUDIES: Dg Chest 2 View  02/18/2016  CLINICAL DATA:  Altered mental status, fever EXAM: CHEST  2 VIEW COMPARISON:  05/23/2015 FINDINGS: There is a left-sided Port-A-Cath with the tip projecting over the SVC. There is no focal parenchymal opacity. There is no pleural effusion or pneumothorax. The heart and mediastinal contours are unremarkable. Prior proximal left humeral ORIF. Moderate osteoarthritis of the right glenohumeral joint. IMPRESSION: No active cardiopulmonary disease. Electronically Signed   By: Kathreen Devoid   On: 02/18/2016 20:51   Ct Maxillofacial W Contrast  02/19/2016  CLINICAL DATA:  Poor dentition in a patient with fever. Question tooth abscess. EXAM: CT MAXILLOFACIAL WITH CONTRAST TECHNIQUE: Multidetector CT imaging of the maxillofacial structures was performed with intravenous contrast. Multiplanar CT image reconstructions were also generated. A small metallic BB was placed on the right temple in order to reliably  differentiate right from left. CONTRAST:  65m ISOVUE-300 IOPAMIDOL (ISOVUE-300) INJECTION 61% COMPARISON:  None. FINDINGS: No evidence for soft tissue abscess adjacent to the maxilla or mandible. Multiple dental cavities are identified in the remaining teeth in numerous teeth have periapical lucency within the adjacent bony anatomy. No edema or inflammation within the visualized subcutaneous soft tissues of face. No submandibular or submental lymphadenopathy. No findings to suggest tonsillar abscess. There is some trace chronic mucosal disease in the left maxillary sinus, but the remaining visualized paranasal sinuses and mastoid air cells are clear. No evidence for fluid in the middle ear. IMPRESSION: 1. No evidence for soft tissue abscess in the region of the maxilla or mandible. 2. Lucency associated with the roots of multiple upper and lower teeth. Periapical abscess cannot be excluded for any of these teeth. Electronically Signed   By: EMisty StanleyM.D.   On: 02/19/2016 12:07     ASSESSMENT: 75y.o.  [place] woman with stage IV [bone only] breast cancer as of May 2014  (1)  status post right lower outer quadrant lumpectomy and axillary lymph node dissection 11/17/2010 for a pT2 pN1, stage IIB invasive ductal carcinoma, grade 2, estrogen and progesterone receptor positive, HER-2 negative,   (2) Oncotype DX recurrence score of 27 predicting a risk of distant recurrence of 18% with 5 years of tamoxifen (intermediate score);   (3)  status post radiation completed July of 2012,   (4) started anastrozole July 2012, discontinued June 2014 with evidence of metastatic spread to bone  (5) Chronic lymphedema in the right upper extremity.  (6) pathologic fracture of the left humerus along apparent lytic lesion, with no other bone lesions per  bone scan 12/12/2012  (7) on 12/27/2012 underwent #1 intramedullary nail left pathologic proximal humerus fracture  #2 left shoulder rotator cuff repair  #3  left shoulder open bone biopsy with pathology showing metastatic breast adenocarcinoma, estrogen receptor 100% positive, progesterone receptor and HER-2 negative, with an MIB-1 of 26%. PET scan 01/13/2013 showed no other areas of disease   (8) status post 35 Gy to the left proximal humerus completed 03/13/2013(  (9) fulvestrant, started on 01/23/2013, stopped May 2017 with progression  (10) zolendronic acid given monthly, first dose 01/23/2013; changed to denosumab as of August 2015 because of access problems, being given every three months  (a) May 2017 dose help after several extractions  (11) multiple liver lesions noted on scans April 2015   (12) letrozole added May 2015, stopped 04/18/14 because of progression in her liver noted on a chest CT performed on 03/14/14  (13) everolimus and exemestane started 04/19/14, stopped May 2017  (a) MRI of the abdomen 07/12/15 showed stable disease  (b) repeat abdominal MRI 12/04/2015 showed progression  (14) started CMF chemotherapy (cyclophosphamide, methotrexate, fluorouracil) 12/24/2015  (a) admitted 02/18/2016 with febrile neutropenia, negative cultures  (b) axillofacial CT scan 02/19/2016 shows no soft-tissue abscessbut lucencies in the roots of multiple upper and lower teeth may indicate periapical abscesses  PLAN: I reviewed this alone is general situation, and her recent problems with infection. With negative cultures it appears her dental condition is the source of her infection. That is also where she is symptomatic.  She  has completed 3 cycles of CMF chemotherapy. She is set up for restaging studies tomorrow. If we document a response, the plan will be to continue CMF in the next cycle will be on August 8. We would add on Pro to that given her recent bout of febrile neutropenia.  If there has been evidence of disease progression we will consider eribulin.  She needs to see her dentist for better dental hygiene and to see whether she needs  extractions or other treatments. She tells me that she will call and make that appointment within the week.  Otherwise she knows to call for any problems that may develop before her next visit here.       Chauncey Cruel, MD  02/25/2016

## 2016-02-25 NOTE — Patient Instructions (Signed)

## 2016-02-26 ENCOUNTER — Ambulatory Visit (HOSPITAL_COMMUNITY)
Admission: RE | Admit: 2016-02-26 | Discharge: 2016-02-26 | Disposition: A | Discharge status: Home / Self Care | Payer: Medicare Other | Source: Ambulatory Visit | Attending: Oncology | Admitting: Oncology

## 2016-02-26 ENCOUNTER — Other Ambulatory Visit: Payer: Self-pay | Admitting: Oncology

## 2016-02-26 DIAGNOSIS — M47816 Spondylosis without myelopathy or radiculopathy, lumbar region: Secondary | ICD-10-CM | POA: Insufficient documentation

## 2016-02-26 DIAGNOSIS — M5136 Other intervertebral disc degeneration, lumbar region: Secondary | ICD-10-CM | POA: Insufficient documentation

## 2016-02-26 DIAGNOSIS — C787 Secondary malignant neoplasm of liver and intrahepatic bile duct: Secondary | ICD-10-CM | POA: Insufficient documentation

## 2016-02-26 DIAGNOSIS — C50512 Malignant neoplasm of lower-outer quadrant of left female breast: Secondary | ICD-10-CM | POA: Insufficient documentation

## 2016-02-26 DIAGNOSIS — C7951 Secondary malignant neoplasm of bone: Secondary | ICD-10-CM | POA: Diagnosis present

## 2016-02-26 DIAGNOSIS — R600 Localized edema: Secondary | ICD-10-CM | POA: Insufficient documentation

## 2016-02-26 LAB — CANCER ANTIGEN 27.29: CA 27.29: 380.3 U/mL — ABNORMAL HIGH (ref 0.0–38.6)

## 2016-03-06 ENCOUNTER — Ambulatory Visit: Payer: Medicare Other | Admitting: Podiatry

## 2016-03-13 ENCOUNTER — Ambulatory Visit: Payer: Medicare Other | Admitting: Podiatry

## 2016-03-17 ENCOUNTER — Other Ambulatory Visit: Payer: Self-pay | Admitting: Oncology

## 2016-03-17 ENCOUNTER — Ambulatory Visit (HOSPITAL_BASED_OUTPATIENT_CLINIC_OR_DEPARTMENT_OTHER): Payer: Medicare Other

## 2016-03-17 ENCOUNTER — Telehealth: Payer: Self-pay | Admitting: Oncology

## 2016-03-17 ENCOUNTER — Ambulatory Visit: Payer: Medicare Other

## 2016-03-17 ENCOUNTER — Other Ambulatory Visit (HOSPITAL_BASED_OUTPATIENT_CLINIC_OR_DEPARTMENT_OTHER): Payer: Medicare Other

## 2016-03-17 VITALS — BP 112/73 | HR 80 | Temp 98.7°F | Resp 18

## 2016-03-17 DIAGNOSIS — C50512 Malignant neoplasm of lower-outer quadrant of left female breast: Secondary | ICD-10-CM

## 2016-03-17 DIAGNOSIS — Z95828 Presence of other vascular implants and grafts: Secondary | ICD-10-CM

## 2016-03-17 DIAGNOSIS — C787 Secondary malignant neoplasm of liver and intrahepatic bile duct: Secondary | ICD-10-CM | POA: Diagnosis not present

## 2016-03-17 DIAGNOSIS — C7951 Secondary malignant neoplasm of bone: Secondary | ICD-10-CM

## 2016-03-17 LAB — COMPREHENSIVE METABOLIC PANEL
ALBUMIN: 2.7 g/dL — AB (ref 3.5–5.0)
ALK PHOS: 86 U/L (ref 40–150)
AST: 14 U/L (ref 5–34)
Anion Gap: 8 mEq/L (ref 3–11)
BILIRUBIN TOTAL: 0.65 mg/dL (ref 0.20–1.20)
BUN: 10.4 mg/dL (ref 7.0–26.0)
CALCIUM: 9.2 mg/dL (ref 8.4–10.4)
CO2: 26 mEq/L (ref 22–29)
CREATININE: 0.7 mg/dL (ref 0.6–1.1)
Chloride: 105 mEq/L (ref 98–109)
EGFR: >90 mL/min/{1.73_m2} (ref >90)
GLUCOSE: 112 mg/dL (ref 70–140)
Potassium: 3.9 mEq/L (ref 3.5–5.1)
SODIUM: 139 meq/L (ref 136–145)
TOTAL PROTEIN: 6.7 g/dL (ref 6.4–8.3)

## 2016-03-17 LAB — CBC WITH DIFFERENTIAL/PLATELET
BASO%: 1.1 % (ref 0.0–2.0)
BASOS ABS: 0.1 10*3/uL (ref 0.0–0.1)
EOS%: 5.6 % (ref 0.0–7.0)
Eosinophils Absolute: 0.3 10*3/uL (ref 0.0–0.5)
HEMATOCRIT: 30.5 % — AB (ref 34.8–46.6)
HEMOGLOBIN: 9.7 g/dL — AB (ref 11.6–15.9)
LYMPH#: 1.4 10*3/uL (ref 0.9–3.3)
LYMPH%: 26.3 % (ref 14.0–49.7)
MCH: 27.5 pg (ref 25.1–34.0)
MCHC: 31.7 g/dL (ref 31.5–36.0)
MCV: 86.8 fL (ref 79.5–101.0)
MONO#: 0.7 10*3/uL (ref 0.1–0.9)
MONO%: 12.7 % (ref 0.0–14.0)
NEUT%: 54.3 % (ref 38.4–76.8)
NEUTROS ABS: 3 10*3/uL (ref 1.5–6.5)
Platelets: 190 10*3/uL (ref 145–400)
RBC: 3.52 10*6/uL — ABNORMAL LOW (ref 3.70–5.45)
RDW: 20 % — ABNORMAL HIGH (ref 11.2–14.5)
WBC: 5.4 10*3/uL (ref 3.9–10.3)

## 2016-03-17 MED ORDER — SODIUM CHLORIDE 0.9% FLUSH
10.0000 mL | INTRAVENOUS | Status: DC | PRN
  Administered 2016-03-17: 10 mL
  Filled 2016-03-17: qty 10

## 2016-03-17 MED ORDER — SODIUM CHLORIDE 0.9 % IJ SOLN
10.0000 mL | INTRAMUSCULAR | Status: DC | PRN
  Administered 2016-03-17: 10 mL via INTRAVENOUS
  Filled 2016-03-17: qty 10

## 2016-03-17 MED ORDER — HEPARIN SOD (PORK) LOCK FLUSH 100 UNIT/ML IV SOLN
500.0000 [IU] | Freq: Once | INTRAVENOUS | Status: AC | PRN
Start: 2016-03-17 — End: 2016-03-17
  Administered 2016-03-17: 500 [IU]
  Filled 2016-03-17: qty 5

## 2016-03-17 NOTE — Progress Notes (Signed)
Pt is s/p hospitalization and recent scans. TC gto Dr. Virgie Dad nurse to see if Dr. [name] will see her prior to treatment. Per Mateo Flow, RN-Dr. Magrinat will see her in the treatment area.  Dr. [name] saw pt here. Treatment plan to change d/t changes in scans. No treatment today. Pt to be rescheduled  for next week.  Port flushed and deaccessed.

## 2016-03-17 NOTE — Telephone Encounter (Signed)
appt made and avs to print in treatment room °

## 2016-03-17 NOTE — Patient Instructions (Signed)

## 2016-03-18 LAB — CANCER ANTIGEN 27.29: CAN 27.29: 369.1 U/mL — AB (ref 0.0–38.6)

## 2016-03-24 ENCOUNTER — Ambulatory Visit (HOSPITAL_BASED_OUTPATIENT_CLINIC_OR_DEPARTMENT_OTHER): Payer: Medicare Other | Admitting: Oncology

## 2016-03-24 ENCOUNTER — Other Ambulatory Visit (HOSPITAL_BASED_OUTPATIENT_CLINIC_OR_DEPARTMENT_OTHER): Payer: Medicare Other

## 2016-03-24 ENCOUNTER — Ambulatory Visit (HOSPITAL_BASED_OUTPATIENT_CLINIC_OR_DEPARTMENT_OTHER): Payer: Medicare Other

## 2016-03-24 ENCOUNTER — Telehealth: Payer: Self-pay | Admitting: Oncology

## 2016-03-24 ENCOUNTER — Ambulatory Visit: Payer: Medicare Other

## 2016-03-24 VITALS — BP 108/89 | HR 73 | Temp 98.5°F | Resp 18 | Ht 60.0 in | Wt 170.0 lb

## 2016-03-24 DIAGNOSIS — C787 Secondary malignant neoplasm of liver and intrahepatic bile duct: Secondary | ICD-10-CM

## 2016-03-24 DIAGNOSIS — C7951 Secondary malignant neoplasm of bone: Secondary | ICD-10-CM

## 2016-03-24 DIAGNOSIS — F329 Major depressive disorder, single episode, unspecified: Secondary | ICD-10-CM

## 2016-03-24 DIAGNOSIS — C50512 Malignant neoplasm of lower-outer quadrant of left female breast: Secondary | ICD-10-CM

## 2016-03-24 DIAGNOSIS — Z95828 Presence of other vascular implants and grafts: Secondary | ICD-10-CM

## 2016-03-24 LAB — CBC WITH DIFFERENTIAL/PLATELET
BASO%: 1.4 % (ref 0.0–2.0)
BASOS ABS: 0.1 10*3/uL (ref 0.0–0.1)
EOS ABS: 0.1 10*3/uL (ref 0.0–0.5)
EOS%: 2.2 % (ref 0.0–7.0)
HCT: 29.9 % — ABNORMAL LOW (ref 34.8–46.6)
HEMOGLOBIN: 9.6 g/dL — AB (ref 11.6–15.9)
LYMPH%: 28.3 % (ref 14.0–49.7)
MCH: 27.7 pg (ref 25.1–34.0)
MCHC: 32.2 g/dL (ref 31.5–36.0)
MCV: 86 fL (ref 79.5–101.0)
MONO#: 0.5 10*3/uL (ref 0.1–0.9)
MONO%: 8.4 % (ref 0.0–14.0)
NEUT#: 3.7 10*3/uL (ref 1.5–6.5)
NEUT%: 59.7 % (ref 38.4–76.8)
Platelets: 318 10*3/uL (ref 145–400)
RBC: 3.47 10*6/uL — ABNORMAL LOW (ref 3.70–5.45)
RDW: 19.5 % — AB (ref 11.2–14.5)
WBC: 6.1 10*3/uL (ref 3.9–10.3)
lymph#: 1.7 10*3/uL (ref 0.9–3.3)

## 2016-03-24 LAB — COMPREHENSIVE METABOLIC PANEL
ALBUMIN: 2.8 g/dL — AB (ref 3.5–5.0)
ALK PHOS: 80 U/L (ref 40–150)
ALT: <9 U/L (ref 0–55)
AST: 17 U/L (ref 5–34)
Anion Gap: 8 mEq/L (ref 3–11)
BUN: 14.2 mg/dL (ref 7.0–26.0)
CHLORIDE: 106 meq/L (ref 98–109)
CO2: 28 meq/L (ref 22–29)
Calcium: 9.6 mg/dL (ref 8.4–10.4)
Creatinine: 1 mg/dL (ref 0.6–1.1)
EGFR: 61 mL/min/{1.73_m2} — AB (ref >90)
GLUCOSE: 76 mg/dL (ref 70–140)
POTASSIUM: 4.2 meq/L (ref 3.5–5.1)
SODIUM: 141 meq/L (ref 136–145)
Total Bilirubin: 0.34 mg/dL (ref 0.20–1.20)
Total Protein: 6.8 g/dL (ref 6.4–8.3)

## 2016-03-24 MED ORDER — SODIUM CHLORIDE 0.9 % IJ SOLN
10.0000 mL | INTRAMUSCULAR | Status: DC | PRN
  Administered 2016-03-24: 10 mL via INTRAVENOUS
  Filled 2016-03-24: qty 10

## 2016-03-24 MED ORDER — PROCHLORPERAZINE MALEATE 10 MG PO TABS: 10.0000 mg | ORAL_TABLET | Freq: Four times a day (QID) | ORAL | 1 refills | Status: DC | PRN

## 2016-03-24 NOTE — Progress Notes (Signed)
ID: Christy Harrell   DOB: [birth year]  MR#: [id number]  WUJ#:[id number]  PCP: [name], MD GYN:  SU:  OTHER MD:  Alysia Penna, Frederik Pear, Mellody Life DDS  CC: Breast cancer stage IV  TREATMENT: eribulin, [denosumab]  BREAST CANCER HISTORY: From the original intake note:  The patient had routine screening mammography 10/16/2010 showing a lobulated mass in the right subareolar region measuring up to 5.5 cm. The Left breast showed some central microcalcifications. Biopsy of the Right breast mass on 10/28/2010 showed an invasive ductal carcinoma, grade 2, estrogen receptor positive (Allred score 8), progesterone receptor positive (Allred score 5), with an equivocal HER-2. The Left breast area of microcalcifications was biopsied at the same time, and was read as suspicious for DCIS.  On 11/17/2010 the patient underwent Right lumpectomy and axillary lymph node dissection for what proved to be a 3 cm invasive ductal carcinoma, grade 2, with some papillary and mucinous features. One of 16 lymph nodes was involved. FISH showed no HER-2 amplification. Left breast biopsy was benign.  The patient had an Oncotype sent, with a score of 27, predicting a risk of distant recurrence after 5 years of tamoxifen in the 18% range. With this intermediate result, the decision was made not to proceed with chemotherapy, since the patient is the sole caregiver to her very elderly mother. Instead the patient proceeded to radiation treatment which was completed 03/06/2011. She started anastrozole at that point. Her subsequent history is as detailed below   INTERVAL HISTORY: Christy Harrell returns today for follow up of her metastatic breast cancer. She had been receiving CMF chemotherapy, generally well tolerated although she did have an admission for fever after cycle 3. Unfortunately repeat MRI of the liver 02/26/2016, showed disease progression. The largest  lesion in the liver had grown from 5.4 cm to 6.2 cm. Although this is only a third of an inch, it does indicate measurable growth and therefore we are changing treatments.  She has had significant dental problems, but fortunately maxillofacial films 02/19/2016 showed his. There were multiple lucencies in the roots of multiple teeth. She is working with her dentist in the hopes to not losing all her teeth.  REVIEW OF SYSTEMS: Christy Harrell is pretty much over her injection. She has been a little bit depressed and "abdomen crying some". She is hoping her daughter Christy Harrell may come visit from [place] but she tells me her daughter has pulmonary hypertension and a central nervous system cancer, which while localized is causing her disability. There are also some family issues in [place] that she did not give me details. Christy Harrell 17 some pain in the right side of her back, some ankle swelling, a little bit of erythema in the lower legs, but remains normally active and particular is very active in her church. A detailed review of systems today was otherwise stable.  PAST MEDICAL HISTORY: Past Medical History:  Diagnosis Date  . Arthritis   . Cancer ([facility])    right breast  . Club foot   . Diabetes mellitus    metformin  . GERD (gastroesophageal reflux disease)    watches diet  . History of radiation therapy 02/22/13- 03/13/13   left proximal humerus 3500 cGy 14 sessions  . Hyperlipidemia   . Hypertension   . Lymphedema    right arm  . Metastasis from malignant tumor of breast ([facility])    left humerous  . Metastasis from malignant tumor of breast (  [facility])    liver  . Neuromuscular disorder ([facility])    lt arm numb sometimes  . Osteoporosis   . Thyroid disease   . Wears glasses     PAST SURGICAL HISTORY: Past Surgical History:  Procedure Laterality Date  . BREAST SURGERY  2012   rt lump-16 nodes-in [place]  . CESAREAN SECTION    . CESAREAN SECTION    . COLONOSCOPY    . DILATION AND CURETTAGE  OF UTERUS    . FOOT SURGERY     as a child for club foot  . HUMERUS IM NAIL Left 12/27/2012   Procedure: INTRAMEDULLARY (IM) NAIL HUMERAL LEFT PATHOLOGIC FRACTURE;  Surgeon: Nita Sells, MD;  Location: [facility];  Service: Orthopedics;  Laterality: Left;  . THYROIDECTOMY  2002    FAMILY HISTORY Family History  Problem Relation Age of Onset  . Cancer Neg Hx   . Arthritis Neg Hx   . Heart disease Neg Hx   . Hyperlipidemia Neg Hx   . Hypertension Neg Hx   . Dementia Mother   . Hearing loss Mother    The patient's father died from unknown causes. The patient's mother is alive at age 82. The patient was a single child. There is no history of breast or ovarian cancer in the family to her knowledge.  GYNECOLOGIC HISTORY: Menarche age 80, menopause in her early 7s. The patient is GX P1, first pregnancy to term age 46. She did not take hormone replacement  SOCIAL HISTORY: The patient grew up in [place], attended [employer], then moved to [place] where she lived about 40 years. 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 Her mother, Christy Harrell away at 100, on 03-Sep-2014. The patient's daughter Christy Harrell, 22, is disabled secondary to pulmonary hypertension. She can be reached at [phone]. The patient has 2 grandchildren, Christy Harrell who works at a Scientist, physiological business and is 75 years old, in general, 70, completing high school.   ADVANCED DIRECTIVES:not in place  HEALTH MAINTENANCE: Social History  Substance Use Topics  . Smoking status: Former Smoker    Quit date: 12/23/1974  . Smokeless tobacco: Never Used  . Alcohol use No     Colonoscopy:  PAP:    Bone density:  2012, on ibandronate chronically  Lipid panel: UTD  Allergies  Allergen Reactions  . Bydureon [Exenatide] Rash  . Gadolinium Derivatives Other (See Comments)    Pt began having chest numbness/tingling , stated her heart felt funny, had to  take her to ED for EKG per RN   CAP  . Enalapril Rash    Current Outpatient Prescriptions  Medication Sig Dispense Refill  . amoxicillin-clavulanate (AUGMENTIN) 875-125 MG tablet Take 1 tablet by mouth 2 (two) times daily. 8 tablet 0  . aspirin EC 81 MG tablet Take 1 tablet (81 mg total) by mouth daily.    Marland Kitchen atorvastatin (LIPITOR) 80 MG tablet Take 1 tablet (80 mg total) by mouth daily. 30 tablet 11  . docusate sodium (COLACE) 100 MG capsule Take 1 capsule (100 mg total) by mouth 2 (two) times daily as needed. Reported on 10/17/2015 (Patient taking differently: Take 100 mg by mouth 2 (two) times daily as needed for mild constipation. Reported on 10/17/2015) 10 capsule 0  . doxepin (SINEQUAN) 25 MG capsule Take 2 capsules (50 mg total) by mouth at bedtime as needed. (Patient taking differently: Take 50 mg by mouth at bedtime as needed (sleep). )  180 capsule 1  . fentaNYL (DURAGESIC - DOSED MCG/HR) 25 MCG/HR patch Place 1 patch (25 mcg total) onto the skin every 3 (three) days. 5 patch 0  . furosemide (LASIX) 40 MG tablet Take 1 tablet (40 mg total) by mouth daily. 90 tablet 2  . levofloxacin (LEVAQUIN) 500 MG tablet Take 1 tablet (500 mg total) by mouth daily. 4 tablet 0  . levothyroxine (SYNTHROID) 175 MCG tablet Take 1 tablet (175 mcg total) by mouth daily before breakfast. 90 tablet 1  . lubiprostone (AMITIZA) 24 MCG capsule Take 1 capsule (24 mcg total) by mouth 2 (two) times daily with a meal. (Patient taking differently: Take 24 mcg by mouth 2 (two) times daily as needed for constipation. ) 180 capsule 1  . magic mouthwash SOLN Take 5 mLs by mouth 4 (four) times daily as needed for mouth pain. 240 mL 3  . metFORMIN (GLUCOPHAGE) 500 MG tablet Take 1 tablet (500 mg total) by mouth daily. 90 tablet 1  . metoCLOPramide (REGLAN) 5 MG tablet Take 1 tablet (5 mg total) by mouth every 8 (eight) hours as needed for nausea. (Patient not taking: Reported on 02/18/2016) 90 tablet 12  . ondansetron (ZOFRAN) 8  MG tablet Take twice a day starting the evenng of the day of chemo and continuing 2 full days (Patient not taking: Reported on 02/18/2016) 40 tablet 0  . oxyCODONE (OXY IR/ROXICODONE) 5 MG immediate release tablet Take 1-2 tablets (5-10 mg total) by mouth every 6 (six) hours as needed for severe pain. 100 tablet 0  . polyethylene glycol powder (GLYCOLAX/MIRALAX) powder Take 255 g (1 Container total) by mouth daily. (Patient taking differently: Take 1 Container by mouth daily as needed for mild constipation or moderate constipation. ) 255 g 3  . potassium chloride SA (K-DUR,KLOR-CON) 20 MEQ tablet Take 1 tablet (20 mEq total) by mouth 2 (two) times daily. 180 tablet 1  . pregabalin (LYRICA) 75 MG capsule Take 1 capsule (75 mg total) by mouth 2 (two) times daily. Not taking (Patient taking differently: Take 75 mg by mouth 2 (two) times daily as needed (bone pain). Not taking) 60 capsule 5  . prochlorperazine (COMPAZINE) 10 MG tablet Take one tablet three times a day before meals tarting the evening of chemo and continuing for an additional two days (Patient not taking: Reported on 02/18/2016) 30 tablet 0  . prochlorperazine (COMPAZINE) 10 MG tablet Take 1 tablet (10 mg total) by mouth every 6 (six) hours as needed (Nausea or vomiting). 30 tablet 1   No current facility-administered medications for this visit.    PHYSICAL EXAM: Elderly African American woman  Vitals:   03/24/16 1153  BP: 108/89  Pulse: 73  Resp: 18  Temp: 98.5 F (36.9 C)   Body mass index is 33.2 kg/m.    ECOG FS: 2 Filed Weights   03/24/16 1153  Weight: 170 lb (77.1 kg)   Sclerae unicteric, EOMs intact Oropharynx clear, Dentition in poor repair No cervical or supraclavicular adenopathy Lungs no rales or rhonchi Heart regular rate and rhythm Abd soft, nontender, positive bowel sounds MSK no focal spinal tenderness, chronic right grade 3 upper extremity lymphedema Neuro: nonfocal, well oriented, appropriate affect Breasts:  Deferred    LAB RESULTS: Lab Results  Component Value Date   WBC 6.1 03/24/2016   NEUTROABS 3.7 03/24/2016   HGB 9.6 (L) 03/24/2016   HCT 29.9 (L) 03/24/2016   MCV 86.0 03/24/2016   PLT 318 03/24/2016  Chemistry      Component Value Date/Time   NA 141 03/24/2016 1117   K 4.2 03/24/2016 1117   CL 107 02/21/2016 0410   CL 105 01/23/2013 1337   CO2 28 03/24/2016 1117   BUN 14.2 03/24/2016 1117   CREATININE 1.0 03/24/2016 1117      Component Value Date/Time   CALCIUM 9.6 03/24/2016 1117   ALKPHOS 80 03/24/2016 1117   AST 17 03/24/2016 1117   ALT <9 03/24/2016 1117   BILITOT 0.34 03/24/2016 1117       Lab Results  Component Value Date   LABCA2 24 11/19/2011    STUDIES: No results found.   ASSESSMENT: 75 y.o.  [place] woman with stage IV [bone only] breast cancer as of May 2014  (1)  status post right lower outer quadrant lumpectomy and axillary lymph node dissection 11/17/2010 for a pT2 pN1, stage IIB invasive ductal carcinoma, grade 2, estrogen and progesterone receptor positive, HER-2 negative,   (2) Oncotype DX recurrence score of 27 predicting a risk of distant recurrence of 18% with 5 years of tamoxifen (intermediate score);   (3)  status post radiation completed July of 2012,   (4) started anastrozole July 2012, discontinued June 2014 with evidence of metastatic spread to bone  (5) Chronic lymphedema in the right upper extremity.  (6) pathologic fracture of the left humerus along apparent lytic lesion, with no other bone lesions per bone scan 12/12/2012  (7) on 12/27/2012 underwent #1 intramedullary nail left pathologic proximal humerus fracture  #2 left shoulder rotator cuff repair  #3 left shoulder open bone biopsy with pathology showing metastatic breast adenocarcinoma, estrogen receptor 100% positive, progesterone receptor and HER-2 negative, with an MIB-1 of 26%. PET scan 01/13/2013 showed no other areas of disease   (8) status post 35 Gy  to the left proximal humerus completed 03/13/2013(  (9) fulvestrant, started on 01/23/2013, stopped May 2017 with progression  (10) zolendronic acid given monthly, first dose 01/23/2013; changed to denosumab as of August 2015 because of access problems, being given every three months  (a) May 2017 dose help after several extractions  (11) multiple liver lesions noted on scans April 2015   (12) letrozole added May 2015, stopped 04/18/14 because of progression in her liver noted on a chest CT performed on 03/14/14  (13) everolimus and exemestane started 04/19/14, stopped May 2017  (a) MRI of the abdomen 07/12/15 showed stable disease  (b) repeat abdominal MRI 12/04/2015 showed progression  (14) started CMF chemotherapy (cyclophosphamide, methotrexate, fluorouracil) 12/24/2015  (a) admitted 02/18/2016 with febrile neutropenia, negative cultures  (b) axillofacial CT scan 02/19/2016 shows no soft-tissue abscessbut lucencies in the roots of multiple upper and lower teeth may indicate periapical abscesses  (c) MRI of the liver 02/26/2016 shows disease progression: CMF discontinued  (15) to start eribulin 03/31/2016, to be repeated days 1 and 8 of each cycle  PLAN: I had hoped to start Christy Harrell today on her eribulin, but this has not yet been approved and we are moving her chemotherapy to next week. The only possible benefit of that, if there is one, is that she will have a little bit more time to recover from her dental problems, and also that her daughter may be here for her day 8 treatment, which would be particularly useful.  However I did not know that her daughter Christy Harrell in [place] has brain cancer. This may make it difficult for her to travel here. They have also been other problems with  the family up [place] which also may make that trip difficult.  This alone he has been very patient with all these delays and we have to work twice this are to make sure that she noted only gets her treatment  some time but also that she tolerates them well. She will see me with the day 1 and day 8 treatment on the first 2 cycles.  She knows to call for any problems that may develop before the next visit here.       Chauncey Cruel, MD  03/29/16

## 2016-03-24 NOTE — Telephone Encounter (Signed)
appt made and avs printed °

## 2016-03-24 NOTE — Patient Instructions (Signed)

## 2016-03-25 ENCOUNTER — Telehealth: Payer: Self-pay | Admitting: *Deleted

## 2016-03-25 LAB — CANCER ANTIGEN 27.29: CA 27.29: 392.7 U/mL — ABNORMAL HIGH (ref 0.0–38.6)

## 2016-03-25 NOTE — Telephone Encounter (Signed)
This RN received note stating need for MD to contact Otila Kluver with pt's insurance for additional clinical data -per Baxter Flattery at [place] office for Eribulin.  This RN called above to obtain as well as offer information needed by MD.  Otila Kluver states she does not need the MD to call but our managed care dept manager " because we need the CMF to be removed from her plan to give a start date for the Eribulin " " I have left several messages for Darlena and am awaiting a return call from her "  Per further discussion this RN faxed clinical data including dictation from 02/25/2016 stating need to change chemo regimen if noted restaging shows progression and MRI of liver obtained on 02/26/2016 showing progression.  Pt has not had any chemo since 02/04/2016 due to hospitalization then awaiting prior authorization.  Pt is currently scheduled to start Eribulin on 03/30/2016.  This RN returned note given to this RN to [facility] with above information and request for her to call Otila Kluver.

## 2016-03-29 ENCOUNTER — Other Ambulatory Visit: Payer: Self-pay | Admitting: Oncology

## 2016-03-30 ENCOUNTER — Other Ambulatory Visit: Payer: Self-pay | Admitting: *Deleted

## 2016-03-30 DIAGNOSIS — C50512 Malignant neoplasm of lower-outer quadrant of left female breast: Secondary | ICD-10-CM

## 2016-03-31 ENCOUNTER — Ambulatory Visit (HOSPITAL_BASED_OUTPATIENT_CLINIC_OR_DEPARTMENT_OTHER): Payer: Medicare Other | Admitting: Oncology

## 2016-03-31 ENCOUNTER — Ambulatory Visit: Payer: Medicare Other

## 2016-03-31 ENCOUNTER — Ambulatory Visit (HOSPITAL_BASED_OUTPATIENT_CLINIC_OR_DEPARTMENT_OTHER): Payer: Medicare Other

## 2016-03-31 ENCOUNTER — Other Ambulatory Visit (HOSPITAL_BASED_OUTPATIENT_CLINIC_OR_DEPARTMENT_OTHER): Payer: Medicare Other

## 2016-03-31 VITALS — BP 120/53 | HR 61 | Temp 98.5°F | Resp 18 | Ht 60.0 in | Wt 165.2 lb

## 2016-03-31 DIAGNOSIS — C787 Secondary malignant neoplasm of liver and intrahepatic bile duct: Secondary | ICD-10-CM | POA: Diagnosis not present

## 2016-03-31 DIAGNOSIS — C50512 Malignant neoplasm of lower-outer quadrant of left female breast: Secondary | ICD-10-CM

## 2016-03-31 DIAGNOSIS — C7951 Secondary malignant neoplasm of bone: Secondary | ICD-10-CM | POA: Diagnosis not present

## 2016-03-31 LAB — CBC WITH DIFFERENTIAL/PLATELET
BASO%: 0.5 % (ref 0.0–2.0)
Basophils Absolute: 0 10*3/uL (ref 0.0–0.1)
EOS ABS: 0.1 10*3/uL (ref 0.0–0.5)
EOS%: 1.6 % (ref 0.0–7.0)
HEMATOCRIT: 31.5 % — AB (ref 34.8–46.6)
HEMOGLOBIN: 10.2 g/dL — AB (ref 11.6–15.9)
LYMPH#: 2.2 10*3/uL (ref 0.9–3.3)
LYMPH%: 34.6 % (ref 14.0–49.7)
MCH: 28 pg (ref 25.1–34.0)
MCHC: 32.4 g/dL (ref 31.5–36.0)
MCV: 86.5 fL (ref 79.5–101.0)
MONO#: 0.5 10*3/uL (ref 0.1–0.9)
MONO%: 7.3 % (ref 0.0–14.0)
NEUT%: 56 % (ref 38.4–76.8)
NEUTROS ABS: 3.6 10*3/uL (ref 1.5–6.5)
NRBC: 0 % (ref 0–0)
PLATELETS: 270 10*3/uL (ref 145–400)
RBC: 3.64 10*6/uL — ABNORMAL LOW (ref 3.70–5.45)
RDW: 18 % — AB (ref 11.2–14.5)
WBC: 6.3 10*3/uL (ref 3.9–10.3)

## 2016-03-31 LAB — COMPREHENSIVE METABOLIC PANEL
ALBUMIN: 3 g/dL — AB (ref 3.5–5.0)
ANION GAP: 8 meq/L (ref 3–11)
AST: 16 U/L (ref 5–34)
Alkaline Phosphatase: 77 U/L (ref 40–150)
BILIRUBIN TOTAL: 0.47 mg/dL (ref 0.20–1.20)
BUN: 17.4 mg/dL (ref 7.0–26.0)
CALCIUM: 9.5 mg/dL (ref 8.4–10.4)
CO2: 25 mEq/L (ref 22–29)
CREATININE: 0.7 mg/dL (ref 0.6–1.1)
Chloride: 107 mEq/L (ref 98–109)
EGFR: >90 mL/min/{1.73_m2} (ref >90)
Glucose: 84 mg/dl (ref 70–140)
Potassium: 3.9 mEq/L (ref 3.5–5.1)
Sodium: 141 mEq/L (ref 136–145)
TOTAL PROTEIN: 7.1 g/dL (ref 6.4–8.3)

## 2016-03-31 MED ORDER — HEPARIN SOD (PORK) LOCK FLUSH 100 UNIT/ML IV SOLN
500.0000 [IU] | Freq: Once | INTRAVENOUS | Status: AC | PRN
  Administered 2016-03-31: 500 [IU]
  Filled 2016-03-31: qty 5

## 2016-03-31 MED ORDER — SODIUM CHLORIDE 0.9% FLUSH
10.0000 mL | INTRAVENOUS | Status: DC | PRN
  Administered 2016-03-31: 10 mL
  Filled 2016-03-31: qty 10

## 2016-03-31 MED ORDER — PROCHLORPERAZINE MALEATE 10 MG PO TABS
10.0000 mg | ORAL_TABLET | Freq: Once | ORAL | Status: AC
  Administered 2016-03-31: 10 mg via ORAL

## 2016-03-31 MED ORDER — SODIUM CHLORIDE 0.9 % IV SOLN
Freq: Once | INTRAVENOUS | Status: AC
  Administered 2016-03-31: 13:00:00 via INTRAVENOUS

## 2016-03-31 MED ORDER — SODIUM CHLORIDE 0.9 % IV SOLN
1.4300 mg/m2 | Freq: Once | INTRAVENOUS | Status: AC
  Administered 2016-03-31: 2.5 mg via INTRAVENOUS
  Filled 2016-03-31: qty 5

## 2016-03-31 MED ORDER — PROCHLORPERAZINE MALEATE 10 MG PO TABS
ORAL_TABLET | ORAL | Status: AC
  Filled 2016-03-31: qty 1

## 2016-03-31 NOTE — Patient Instructions (Signed)
Eribulin solution for injection  What is this medicine?  ERIBULIN (er e bu lin) is a chemotherapy drug. It is used to treat breast cancer and liposarcoma.  This medicine may be used for other purposes; ask your health care provider or pharmacist if you have questions.  What should I tell my health care provider before I take this medicine?  They need to know if you have any of these conditions:  -heart disease  -history of irregular heartbeat  -kidney disease  -liver disease  -low blood counts, like low white cell, platelet, or red cell counts  -low levels of potassium or magnesium in the blood  -an unusual or allergic reaction to eribulin, other medicines, foods, dyes, or preservatives  -pregnant or trying to get pregnant  -breast-feeding  How should I use this medicine?  This medicine is for infusion into a vein. It is given by a health care professional in a hospital or clinic setting.  Talk to your pediatrician regarding the use of this medicine in children. Special care may be needed.  Overdosage: If you think you have taken too much of this medicine contact a poison control center or emergency room at once.  NOTE: This medicine is only for you. Do not share this medicine with others.  What if I miss a dose?  It is important not to miss your dose. Call your doctor or health care professional if you are unable to keep an appointment.  What may interact with this medicine?  Do not take this medicine with any of the following medications:  -amiodarone  -astemizole  -arsenic trioxide  -bepridil  -bretylium  -chloroquine  -chlorpromazine  -cisapride  -clarithromycin  -dextromethorphan,  quinidine  -disopyramide  -dofetilide  -droperidol  -dronedarone  -erythromycin  -grepafloxacin  -halofantrine  -haloperidol  -ibutilide  -levomethadyl  -mesoridazine  -methadone  -pentamidine  -procainamide  -quinidine  -pimozide  -posaconazole  -probucol  -propafenone  -saquinavir  -sotalol  -sparfloxacin  -terfenadine  -thioridazine  -troleandomycin  -ziprasidone  This list may not describe all possible interactions. Give your health care provider a list of all the medicines, herbs, non-prescription drugs, or dietary supplements you use. Also tell them if you smoke, drink alcohol, or use illegal drugs. Some items may interact with your medicine.  What should I watch for while using this medicine?  This drug may make you feel generally unwell. This is not uncommon, as chemotherapy can affect healthy cells as well as cancer cells. Report any side effects. Continue your course of treatment even though you feel ill unless your doctor tells you to stop.  Call your doctor or health care professional for advice if you get a fever, chills or sore throat, or other symptoms of a cold or flu. Do not treat yourself. This drug decreases your body's ability to fight infections. Try to avoid being around people who are sick.  This medicine may increase your risk to bruise or bleed. Call your doctor or health care professional if you notice any unusual bleeding.  You may need blood work done while you are taking this medicine.  Do not become pregnant while taking this medicine or for 2 weeks after stopping it. Women should inform their doctor if they wish to become pregnant or think they might be pregnant. Men should not father a child while taking this medicine and for 3.5 months after stopping it. There is a potential for serious side effects to an unborn child. Talk to your health care   professional or pharmacist for more information. Do not breast-feed an infant while taking this medicine or for 2 weeks after stopping  it.  What side effects may I notice from receiving this medicine?  Side effects that you should report to your doctor or health care professional as soon as possible:  -allergic reactions like skin rash, itching or hives, swelling of the face, lips, or tongue  -low blood counts - this medicine may decrease the number of white blood cells, red blood cells and platelets. You may be at increased risk for infections and bleeding.  -signs of infection - fever or chills, cough, sore throat, pain or difficulty passing urine  -signs of decreased platelets or bleeding - bruising, pinpoint red spots on the skin, black, tarry stools, blood in the urine  -signs of decreased red blood cells - unusually weak or tired, fainting spells, lightheadedness  -pain, tingling, numbness in the hands or feet  Side effects that usually do not require medical attention (Report these to your doctor or health care professional if they continue or are bothersome.):  -constipation  -hair loss  -headache  -loss of appetite  -muscle or joint pain  -nausea, vomiting  -stomach pain  This list may not describe all possible side effects. Call your doctor for medical advice about side effects. You may report side effects to FDA at 1-800-FDA-1088.  Where should I keep my medicine?  This drug is given in a hospital or clinic and will not be stored at home.  NOTE: This sheet is a summary. It may not cover all possible information. If you have questions about this medicine, talk to your doctor, pharmacist, or health care provider.      2016, Elsevier/Gold Standard. (2014-09-12 17:51:40)

## 2016-03-31 NOTE — Progress Notes (Signed)
ID: Christy Harrell   DOB: March 22, 1941  MR#: [id number]  ZWC#:[id number]  PCP: [name], MD GYN:  SU:  OTHER MD:  Alysia Penna, Frederik Pear, Mellody Life DDS  CC: Breast cancer stage IV  TREATMENT: eribulin, [denosumab]  BREAST CANCER HISTORY: From the original intake note:  The patient had routine screening mammography 10/16/2010 showing a lobulated mass in the right subareolar region measuring up to 5.5 cm. The Left breast showed some central microcalcifications. Biopsy of the Right breast mass on 10/28/2010 showed an invasive ductal carcinoma, grade 2, estrogen receptor positive (Allred score 8), progesterone receptor positive (Allred score 5), with an equivocal HER-2. The Left breast area of microcalcifications was biopsied at the same time, and was read as suspicious for DCIS.  On 11/17/2010 the patient underwent Right lumpectomy and axillary lymph node dissection for what proved to be a 3 cm invasive ductal carcinoma, grade 2, with some papillary and mucinous features. One of 16 lymph nodes was involved. FISH showed no HER-2 amplification. Left breast biopsy was benign.  The patient had an Oncotype sent, with a score of 27, predicting a risk of distant recurrence after 5 years of tamoxifen in the 18% range. With this intermediate result, the decision was made not to proceed with chemotherapy, since the patient is the sole caregiver to her very elderly mother. Instead the patient proceeded to radiation treatment which was completed 03/06/2011. She started anastrozole at that point. Her subsequent history is as detailed below   INTERVAL HISTORY: Christy Harrell returns today for follow up of her stage IV estrogen receptor positive breast cancer. Today is day 1 cycle 1 of eribulin, which she will receive days 1 and 8 of each 21 day cycle.  She is currently off denosumab/Xgeva, because of significant dental problems, which are  improved but not entirely resolved.  REVIEW OF SYSTEMS: Christy Harrell is very excited because her daughter will be coming to visit next week. The patient is doing a lot of house cleaning and cooking in preparation for that. She has a little bit of a runny nose and some sinus issues. She has chronic arthritis pains here and there but these are not more intense or persistent than before. She has moderate hot flashes. He had some "bumps" on her left leg, but these cleared with the cream she obtained from Dr. Ronnald Ramp. She gets a lot of help from church friend to bring her food and driver shopping. Overall he detailed review of systems today was stable  PAST MEDICAL HISTORY: Past Medical History:  Diagnosis Date  . Arthritis   . Cancer ([facility] [place])    right breast  . Club foot   . Diabetes mellitus    metformin  . GERD (gastroesophageal reflux disease)    watches diet  . History of radiation therapy 02/22/13- 03/13/13   left proximal humerus 3500 cGy 14 sessions  . Hyperlipidemia   . Hypertension   . Lymphedema    right arm  . Metastasis from malignant tumor of breast ([place])    left humerous  . Metastasis from malignant tumor of breast ([place])    liver  . Neuromuscular disorder ([facility])    lt arm numb sometimes  . Osteoporosis   . Thyroid disease   . Wears glasses     PAST SURGICAL HISTORY: Past Surgical History:  Procedure Laterality Date  . BREAST SURGERY  2012   rt lump-16 nodes-in [place]  . CESAREAN SECTION    .  CESAREAN SECTION    . COLONOSCOPY    . DILATION AND CURETTAGE OF UTERUS    . FOOT SURGERY     as a child for club foot  . HUMERUS IM NAIL Left 12/27/2012   Procedure: INTRAMEDULLARY (IM) NAIL HUMERAL LEFT PATHOLOGIC FRACTURE;  Surgeon: Nita Sells, MD;  Location: [facility];  Service: Orthopedics;  Laterality: Left;  . THYROIDECTOMY  2002    FAMILY HISTORY Family History  Problem Relation Age of Onset  . Cancer Neg Hx   . Arthritis Neg Hx   . Heart  disease Neg Hx   . Hyperlipidemia Neg Hx   . Hypertension Neg Hx   . Dementia Mother   . Hearing loss Mother    The patient's father died from unknown causes. The patient's mother is alive at age 87. The patient was a single child. There is no history of breast or ovarian cancer in the family to her knowledge.  GYNECOLOGIC HISTORY: Menarche age 61, menopause in her early 51s. The patient is GX P1, first pregnancy to term age 43. She did not take hormone replacement  SOCIAL HISTORY: The patient grew up in [place], attended [employer], then moved to [place] where she lived about 40 years. 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 Her mother, [name] away at 100, on August 18, 2014. The patient's daughter [name], 28, is disabled secondary to pulmonary hypertension. She can be reached at [phone]. The patient has 2 grandchildren, [name] who works at a Scientist, physiological business and is 75 years old, in general, 89, completing high school.   ADVANCED DIRECTIVES:not in place  HEALTH MAINTENANCE: Social History  Substance Use Topics  . Smoking status: Former Smoker    Quit date: 12/23/1974  . Smokeless tobacco: Never Used  . Alcohol use No     Colonoscopy:  PAP:    Bone density:  2012, on ibandronate chronically  Lipid panel: UTD  Allergies  Allergen Reactions  . Bydureon [Exenatide] Rash  . Gadolinium Derivatives Other (See Comments)    Pt began having chest numbness/tingling , stated her heart felt funny, had to take her to ED for EKG per RN   CAP  . Enalapril Rash    Current Outpatient Prescriptions  Medication Sig Dispense Refill  . amoxicillin-clavulanate (AUGMENTIN) 875-125 MG tablet Take 1 tablet by mouth 2 (two) times daily. 8 tablet 0  . aspirin EC 81 MG tablet Take 1 tablet (81 mg total) by mouth daily.    Marland Kitchen atorvastatin (LIPITOR) 80 MG tablet Take 1 tablet (80 mg total) by mouth daily. 30 tablet 11  . docusate sodium  (COLACE) 100 MG capsule Take 1 capsule (100 mg total) by mouth 2 (two) times daily as needed. Reported on 10/17/2015 (Patient taking differently: Take 100 mg by mouth 2 (two) times daily as needed for mild constipation. Reported on 10/17/2015) 10 capsule 0  . doxepin (SINEQUAN) 25 MG capsule Take 2 capsules (50 mg total) by mouth at bedtime as needed. (Patient taking differently: Take 50 mg by mouth at bedtime as needed (sleep). ) 180 capsule 1  . fentaNYL (DURAGESIC - DOSED MCG/HR) 25 MCG/HR patch Place 1 patch (25 mcg total) onto the skin every 3 (three) days. 5 patch 0  . furosemide (LASIX) 40 MG tablet Take 1 tablet (40 mg total) by mouth daily. 90 tablet 2  . levofloxacin (LEVAQUIN) 500 MG tablet Take 1 tablet (500 mg total) by mouth  daily. 4 tablet 0  . levothyroxine (SYNTHROID) 175 MCG tablet Take 1 tablet (175 mcg total) by mouth daily before breakfast. 90 tablet 1  . lubiprostone (AMITIZA) 24 MCG capsule Take 1 capsule (24 mcg total) by mouth 2 (two) times daily with a meal. (Patient taking differently: Take 24 mcg by mouth 2 (two) times daily as needed for constipation. ) 180 capsule 1  . magic mouthwash SOLN Take 5 mLs by mouth 4 (four) times daily as needed for mouth pain. 240 mL 3  . metFORMIN (GLUCOPHAGE) 500 MG tablet Take 1 tablet (500 mg total) by mouth daily. 90 tablet 1  . metoCLOPramide (REGLAN) 5 MG tablet Take 1 tablet (5 mg total) by mouth every 8 (eight) hours as needed for nausea. (Patient not taking: Reported on 02/18/2016) 90 tablet 12  . ondansetron (ZOFRAN) 8 MG tablet Take twice a day starting the evenng of the day of chemo and continuing 2 full days (Patient not taking: Reported on 02/18/2016) 40 tablet 0  . oxyCODONE (OXY IR/ROXICODONE) 5 MG immediate release tablet Take 1-2 tablets (5-10 mg total) by mouth every 6 (six) hours as needed for severe pain. 100 tablet 0  . polyethylene glycol powder (GLYCOLAX/MIRALAX) powder Take 255 g (1 Container total) by mouth daily. (Patient  taking differently: Take 1 Container by mouth daily as needed for mild constipation or moderate constipation. ) 255 g 3  . potassium chloride SA (K-DUR,KLOR-CON) 20 MEQ tablet Take 1 tablet (20 mEq total) by mouth 2 (two) times daily. 180 tablet 1  . pregabalin (LYRICA) 75 MG capsule Take 1 capsule (75 mg total) by mouth 2 (two) times daily. Not taking (Patient taking differently: Take 75 mg by mouth 2 (two) times daily as needed (bone pain). Not taking) 60 capsule 5  . prochlorperazine (COMPAZINE) 10 MG tablet Take one tablet three times a day before meals tarting the evening of chemo and continuing for an additional two days (Patient not taking: Reported on 02/18/2016) 30 tablet 0  . prochlorperazine (COMPAZINE) 10 MG tablet Take 1 tablet (10 mg total) by mouth every 6 (six) hours as needed (Nausea or vomiting). 30 tablet 1   No current facility-administered medications for this visit.    Facility-Administered Medications Ordered in Other Visits  Medication Dose Route Frequency Provider Last Rate Last Dose  . eriBULin mesylate (HALAVEN) 2.5 mg in sodium chloride 0.9 % 100 mL chemo infusion  1.43 mg/m2 (Order-Specific) Intravenous Once Chauncey Cruel, MD   2.5 mg at 03/31/16 1344  . heparin lock flush 100 unit/mL  500 Units Intracatheter Once PRN Chauncey Cruel, MD      . sodium chloride flush (NS) 0.9 % injection 10 mL  10 mL Intracatheter PRN Chauncey Cruel, MD       PHYSICAL EXAM: Elderly African American woman Who appears stated age Vitals:   03/31/16 1218  BP: (!) 120/53  Pulse: 61  Resp: 18  Temp: 98.5 F (36.9 C)   Body mass index is 32.26 kg/m.    ECOG FS: 1 Filed Weights   03/31/16 1218  Weight: 165 lb 3.2 oz (74.9 kg)   Sclerae unicteric, pupils round and equal Oropharynx clear and moist-- no thrush or other lesions No cervical or supraclavicular adenopathy Lungs no rales or rhonchi Heart regular rate and rhythm Abd soft, obese, nontender, positive bowel  sounds MSK no focal spinal tenderness, chronic right upper extremity lymphedema, grade 3 Neuro: nonfocal, well oriented, positive affect Breasts: Deferred  LAB RESULTS: Lab Results  Component Value Date   WBC 6.3 03/31/2016   NEUTROABS 3.6 03/31/2016   HGB 10.2 (L) 03/31/2016   HCT 31.5 (L) 03/31/2016   MCV 86.5 03/31/2016   PLT 270 03/31/2016      Chemistry      Component Value Date/Time   NA 141 03/31/2016 1128   K 3.9 03/31/2016 1128   CL 107 02/21/2016 0410   CL 105 01/23/2013 1337   CO2 25 03/31/2016 1128   BUN 17.4 03/31/2016 1128   CREATININE 0.7 03/31/2016 1128      Component Value Date/Time   CALCIUM 9.5 03/31/2016 1128   ALKPHOS 77 03/31/2016 1128   AST 16 03/31/2016 1128   ALT <9 03/31/2016 1128   BILITOT 0.47 03/31/2016 1128       Lab Results  Component Value Date   LABCA2 24 11/19/2011    STUDIES: No results found.   ASSESSMENT: 75 y.o.  [place] woman with stage IV [bone only] breast cancer as of May 2014  (1)  status post right lower outer quadrant lumpectomy and axillary lymph node dissection 11/17/2010 for a pT2 pN1, stage IIB invasive ductal carcinoma, grade 2, estrogen and progesterone receptor positive, HER-2 negative,   (2) Oncotype DX recurrence score of 27 predicting a risk of distant recurrence of 18% with 5 years of tamoxifen (intermediate score);   (3)  status post radiation completed July of 2012,   (4) started anastrozole July 2012, discontinued June 2014 with evidence of metastatic spread to bone  (5) Chronic lymphedema in the right upper extremity.  (6) pathologic fracture of the left humerus along apparent lytic lesion, with no other bone lesions per bone scan 12/12/2012  (7) on 12/27/2012 underwent #1 intramedullary nail left pathologic proximal humerus fracture  #2 left shoulder rotator cuff repair  #3 left shoulder open bone biopsy with pathology showing metastatic breast adenocarcinoma, estrogen receptor 100%  positive, progesterone receptor and HER-2 negative, with an MIB-1 of 26%. PET scan 01/13/2013 showed no other areas of disease   (8) status post 35 Gy to the left proximal humerus completed 03/13/2013(  (9) fulvestrant, started on 01/23/2013, stopped May 2017 with progression  (10) zolendronic acid given monthly, first dose 01/23/2013; changed to denosumab as of August 2015 because of access problems, being given every three months  (a) May 2017 dose help after several extractions  (11) multiple liver lesions noted on scans April 2015   (12) letrozole added May 2015, stopped 04/18/14 because of progression in her liver noted on a chest CT performed on 03/14/14  (13) everolimus and exemestane started 04/19/14, stopped May 2017  (a) MRI of the abdomen 07/12/15 showed stable disease  (b) repeat abdominal MRI 12/04/2015 showed progression  (14) started CMF chemotherapy (cyclophosphamide, methotrexate, fluorouracil) 12/24/2015  (a) admitted 02/18/2016 with febrile neutropenia, negative cultures  (b) axillofacial CT scan 02/19/2016 shows no soft-tissue abscessbut lucencies in the roots of multiple upper and lower teeth may indicate periapical abscesses  (c) MRI of the liver 02/26/2016 shows disease progression: CMF discontinued  (15) started eribulin 03/31/2016, to be repeated days 1 and 8 of each cycle  PLAN: Bodhi is starting her new treatment, eribulin, today. We again reviewed the possible toxicities, side effects and complications of this agent. She at present has no plans as to what to do if she lose her hair and she understands she has a good chance of losing her hair. Likely this will not happen before her daughter comes to  visit next week.  We also again reviewed her scan, which shows measurable although not massive progression on her prior chemotherapy, CMF. She understands after she completes 3 or 4 cycles of eribulin we will repeat the staging studies.  I am going to see her again  next week, with her day 8 treatment, and hopefully her daughter will be able to participate in this visit as she lives out of town and has never I think been oriented to her mother's renal situation  The patient knows to call for any problems that may develop before her next visit here.       Chauncey Cruel, MD  03/31/16

## 2016-04-01 LAB — CANCER ANTIGEN 27.29: CA 27.29: 348.2 U/mL — ABNORMAL HIGH (ref 0.0–38.6)

## 2016-04-06 ENCOUNTER — Other Ambulatory Visit: Payer: Self-pay | Admitting: *Deleted

## 2016-04-06 DIAGNOSIS — C50512 Malignant neoplasm of lower-outer quadrant of left female breast: Secondary | ICD-10-CM

## 2016-04-06 DIAGNOSIS — C787 Secondary malignant neoplasm of liver and intrahepatic bile duct: Secondary | ICD-10-CM

## 2016-04-06 DIAGNOSIS — C7951 Secondary malignant neoplasm of bone: Secondary | ICD-10-CM

## 2016-04-07 ENCOUNTER — Ambulatory Visit (HOSPITAL_BASED_OUTPATIENT_CLINIC_OR_DEPARTMENT_OTHER): Payer: Medicare Other

## 2016-04-07 ENCOUNTER — Telehealth: Payer: Self-pay | Admitting: *Deleted

## 2016-04-07 ENCOUNTER — Telehealth: Payer: Self-pay | Admitting: Oncology

## 2016-04-07 ENCOUNTER — Other Ambulatory Visit (HOSPITAL_BASED_OUTPATIENT_CLINIC_OR_DEPARTMENT_OTHER): Payer: Medicare Other

## 2016-04-07 ENCOUNTER — Ambulatory Visit: Payer: Medicare Other

## 2016-04-07 ENCOUNTER — Ambulatory Visit (HOSPITAL_BASED_OUTPATIENT_CLINIC_OR_DEPARTMENT_OTHER): Payer: Medicare Other | Admitting: Oncology

## 2016-04-07 VITALS — BP 136/90 | HR 72 | Temp 98.5°F | Resp 18 | Ht 60.0 in | Wt 160.4 lb

## 2016-04-07 DIAGNOSIS — C50512 Malignant neoplasm of lower-outer quadrant of left female breast: Secondary | ICD-10-CM

## 2016-04-07 DIAGNOSIS — C787 Secondary malignant neoplasm of liver and intrahepatic bile duct: Secondary | ICD-10-CM | POA: Diagnosis not present

## 2016-04-07 DIAGNOSIS — C7951 Secondary malignant neoplasm of bone: Secondary | ICD-10-CM

## 2016-04-07 DIAGNOSIS — Z95828 Presence of other vascular implants and grafts: Secondary | ICD-10-CM

## 2016-04-07 LAB — CBC WITH DIFFERENTIAL/PLATELET
BASO%: 0.7 % (ref 0.0–2.0)
BASOS ABS: 0 10*3/uL (ref 0.0–0.1)
EOS ABS: 0 10*3/uL (ref 0.0–0.5)
EOS%: 0.3 % (ref 0.0–7.0)
HEMATOCRIT: 31.6 % — AB (ref 34.8–46.6)
HEMOGLOBIN: 10 g/dL — AB (ref 11.6–15.9)
LYMPH#: 1.9 10*3/uL (ref 0.9–3.3)
LYMPH%: 59.1 % — ABNORMAL HIGH (ref 14.0–49.7)
MCH: 27.3 pg (ref 25.1–34.0)
MCHC: 31.6 g/dL (ref 31.5–36.0)
MCV: 86.4 fL (ref 79.5–101.0)
MONO#: 0.2 10*3/uL (ref 0.1–0.9)
MONO%: 5.5 % (ref 0.0–14.0)
NEUT%: 34.4 % — ABNORMAL LOW (ref 38.4–76.8)
NEUTROS ABS: 1.1 10*3/uL — AB (ref 1.5–6.5)
PLATELETS: 232 10*3/uL (ref 145–400)
RBC: 3.65 10*6/uL — ABNORMAL LOW (ref 3.70–5.45)
RDW: 18.7 % — AB (ref 11.2–14.5)
WBC: 3.2 10*3/uL — AB (ref 3.9–10.3)

## 2016-04-07 LAB — COMPREHENSIVE METABOLIC PANEL
ALBUMIN: 3.1 g/dL — AB (ref 3.5–5.0)
ALK PHOS: 76 U/L (ref 40–150)
ALT: <9 U/L (ref 0–55)
AST: 23 U/L (ref 5–34)
Anion Gap: 10 mEq/L (ref 3–11)
BILIRUBIN TOTAL: 0.59 mg/dL (ref 0.20–1.20)
BUN: 10.5 mg/dL (ref 7.0–26.0)
CALCIUM: 9.6 mg/dL (ref 8.4–10.4)
CO2: 26 mEq/L (ref 22–29)
Chloride: 108 mEq/L (ref 98–109)
Creatinine: 0.6 mg/dL (ref 0.6–1.1)
GLUCOSE: 94 mg/dL (ref 70–140)
POTASSIUM: 3.3 meq/L — AB (ref 3.5–5.1)
SODIUM: 144 meq/L (ref 136–145)
TOTAL PROTEIN: 7.2 g/dL (ref 6.4–8.3)

## 2016-04-07 MED ORDER — SODIUM CHLORIDE 0.9 % IV SOLN
Freq: Once | INTRAVENOUS | Status: AC
  Administered 2016-04-07: 14:00:00 via INTRAVENOUS

## 2016-04-07 MED ORDER — PROCHLORPERAZINE MALEATE 10 MG PO TABS
10.0000 mg | ORAL_TABLET | Freq: Once | ORAL | Status: AC
  Administered 2016-04-07: 10 mg via ORAL

## 2016-04-07 MED ORDER — SODIUM CHLORIDE 0.9% FLUSH
10.0000 mL | INTRAVENOUS | Status: DC | PRN
  Administered 2016-04-07: 10 mL
  Filled 2016-04-07: qty 10

## 2016-04-07 MED ORDER — SODIUM CHLORIDE 0.9 % IJ SOLN
10.0000 mL | INTRAMUSCULAR | Status: DC | PRN
  Administered 2016-04-07: 10 mL via INTRAVENOUS
  Filled 2016-04-07: qty 10

## 2016-04-07 MED ORDER — SODIUM CHLORIDE 0.9 % IV SOLN
1.4200 mg/m2 | Freq: Once | INTRAVENOUS | Status: AC
  Administered 2016-04-07: 2.5 mg via INTRAVENOUS
  Filled 2016-04-07: qty 5

## 2016-04-07 MED ORDER — PEGFILGRASTIM 6 MG/0.6ML ~~LOC~~ PSKT: 6.0000 mg | PREFILLED_SYRINGE | Freq: Once | SUBCUTANEOUS | Status: DC

## 2016-04-07 MED ORDER — HEPARIN SOD (PORK) LOCK FLUSH 100 UNIT/ML IV SOLN
500.0000 [IU] | Freq: Once | INTRAVENOUS | Status: AC | PRN
  Administered 2016-04-07: 500 [IU]
  Filled 2016-04-07: qty 5

## 2016-04-07 MED ORDER — PROCHLORPERAZINE MALEATE 10 MG PO TABS
ORAL_TABLET | ORAL | Status: AC
  Filled 2016-04-07: qty 1

## 2016-04-07 NOTE — Telephone Encounter (Signed)
Per MD lab review with visit - ok to proceed with scheduled chemo today with ANC 1.1.   Onpro added to orders.

## 2016-04-07 NOTE — Telephone Encounter (Signed)
appt made and avs print in treatment room after visit

## 2016-04-07 NOTE — Progress Notes (Signed)
ID: Christy Harrell   DOB: [birth year]  MR#: [id number]  MMN#:[id number]  PCP: [name], MD GYN:  SU:  OTHER MD:  Alysia Penna, Frederik Pear, Mellody Life DDS  CC: Breast cancer stage IV  TREATMENT: eribulin, [denosumab]  BREAST CANCER HISTORY: From the original intake note:  The patient had routine screening mammography 10/16/2010 showing a lobulated mass in the right subareolar region measuring up to 5.5 cm. The Left breast showed some central microcalcifications. Biopsy of the Right breast mass on 10/28/2010 showed an invasive ductal carcinoma, grade 2, estrogen receptor positive (Allred score 8), progesterone receptor positive (Allred score 5), with an equivocal HER-2. The Left breast area of microcalcifications was biopsied at the same time, and was read as suspicious for DCIS.  On 11/17/2010 the patient underwent Right lumpectomy and axillary lymph node dissection for what proved to be a 3 cm invasive ductal carcinoma, grade 2, with some papillary and mucinous features. One of 16 lymph nodes was involved. FISH showed no HER-2 amplification. Left breast biopsy was benign.  The patient had an Oncotype sent, with a score of 27, predicting a risk of distant recurrence after 5 years of tamoxifen in the 18% range. With this intermediate result, the decision was made not to proceed with chemotherapy, since the patient is the sole caregiver to her very elderly mother. Instead the patient proceeded to radiation treatment which was completed 03/06/2011. She started anastrozole at that point. Her subsequent history is as detailed below   INTERVAL HISTORY: Naiomy returns today for follow up of her stage IV breast cancer accompanied by her daughter [name]. This is day 8 cycle 1 of eribulin which she receives day 1 and 8 of each 21 day cycle.  [name] is visiting from [place] and this is the first time that I have been able to meet  her.  REVIEW OF SYSTEMS: [place] i did remarkably well with her first dose of eribulin. She had plenty of energy. She ate better. She did some housework. She had no nausea or vomiting, no mouth sores, no cough or phlegm production, and no change in bowel or bladder habits. Of course she continues to have arthritis pains here and there but there are not any more persistent or intense than prior. A detailed review of systems today was otherwise stable  PAST MEDICAL HISTORY: Past Medical History:  Diagnosis Date  . Arthritis   . Cancer ([place])    right breast  . Club foot   . Diabetes mellitus    metformin  . GERD (gastroesophageal reflux disease)    watches diet  . History of radiation therapy 02/22/13- 03/13/13   left proximal humerus 3500 cGy 14 sessions  . Hyperlipidemia   . Hypertension   . Lymphedema    right arm  . 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 Metastasis from malignant tumor of breast ([place])    liver  . Neuromuscular disorder ([facility])    lt arm numb sometimes  . Osteoporosis   . Thyroid disease   . Wears glasses     PAST SURGICAL HISTORY: Past Surgical History:  Procedure Laterality Date  . BREAST SURGERY  2012   rt lump-16 nodes-in [place]  . CESAREAN SECTION    . CESAREAN SECTION    . COLONOSCOPY    . DILATION AND CURETTAGE OF UTERUS    . FOOT SURGERY     as a child  for club foot  . HUMERUS IM NAIL Left 12/27/2012   Procedure: INTRAMEDULLARY (IM) NAIL HUMERAL LEFT PATHOLOGIC FRACTURE;  Surgeon: Nita Sells, MD;  Location: [facility];  Service: Orthopedics;  Laterality: Left;  . THYROIDECTOMY  2002    FAMILY HISTORY Family History  Problem Relation Age of Onset  . Cancer Neg Hx   . Arthritis Neg Hx   . Heart disease Neg Hx   . Hyperlipidemia Neg Hx   . Hypertension Neg Hx   . Dementia Mother   . Hearing loss Mother    The patient's father died from unknown causes. The patient's mother is alive at  age 75. The patient was a single child. There is no history of breast or ovarian cancer in the family to her knowledge.  GYNECOLOGIC HISTORY: Menarche age 65, menopause in her early 22s. The patient is GX P1, first pregnancy to term age 52. She did not take hormone replacement  SOCIAL HISTORY: The patient grew up in [place], attended [employer], then moved to [place] where she lived about 40 years. 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 Her mother, [name] away at 100, on 2014/08/24. The patient's daughter [name], 14, is disabled secondary to pulmonary hypertension. She can be reached at [phone]. The patient has 2 grandchildren, [name] who works at a Scientist, physiological business and is 75 years old, in general, 29, completing high school.   ADVANCED DIRECTIVES:not in place  HEALTH MAINTENANCE: Social History  Substance Use Topics  . Smoking status: Former Smoker    Quit date: 12/23/1974  . Smokeless tobacco: Never Used  . Alcohol use No     Colonoscopy:  PAP:    Bone density:  2012, on ibandronate chronically  Lipid panel: UTD  Allergies  Allergen Reactions  . Bydureon [Exenatide] Rash  . Gadolinium Derivatives Other (See Comments)    Pt began having chest numbness/tingling , stated her heart felt funny, had to take her to ED for EKG per RN   CAP  . Enalapril Rash    Current Outpatient Prescriptions  Medication Sig Dispense Refill  . amoxicillin-clavulanate (AUGMENTIN) 875-125 MG tablet Take 1 tablet by mouth 2 (two) times daily. 8 tablet 0  . aspirin EC 81 MG tablet Take 1 tablet (81 mg total) by mouth daily.    Marland Kitchen atorvastatin (LIPITOR) 80 MG tablet Take 1 tablet (80 mg total) by mouth daily. 30 tablet 11  . docusate sodium (COLACE) 100 MG capsule Take 1 capsule (100 mg total) by mouth 2 (two) times daily as needed. Reported on 10/17/2015 (Patient taking differently: Take 100 mg by mouth 2 (two) times daily as needed for  mild constipation. Reported on 10/17/2015) 10 capsule 0  . doxepin (SINEQUAN) 25 MG capsule Take 2 capsules (50 mg total) by mouth at bedtime as needed. (Patient taking differently: Take 50 mg by mouth at bedtime as needed (sleep). ) 180 capsule 1  . fentaNYL (DURAGESIC - DOSED MCG/HR) 25 MCG/HR patch Place 1 patch (25 mcg total) onto the skin every 3 (three) days. 5 patch 0  . furosemide (LASIX) 40 MG tablet Take 1 tablet (40 mg total) by mouth daily. 90 tablet 2  . levofloxacin (LEVAQUIN) 500 MG tablet Take 1 tablet (500 mg total) by mouth daily. 4 tablet 0  . levothyroxine (SYNTHROID) 175 MCG tablet Take 1 tablet (175 mcg total) by mouth daily before breakfast. 90 tablet 1  . lubiprostone (AMITIZA)  24 MCG capsule Take 1 capsule (24 mcg total) by mouth 2 (two) times daily with a meal. (Patient taking differently: Take 24 mcg by mouth 2 (two) times daily as needed for constipation. ) 180 capsule 1  . magic mouthwash SOLN Take 5 mLs by mouth 4 (four) times daily as needed for mouth pain. 240 mL 3  . metFORMIN (GLUCOPHAGE) 500 MG tablet Take 1 tablet (500 mg total) by mouth daily. 90 tablet 1  . metoCLOPramide (REGLAN) 5 MG tablet Take 1 tablet (5 mg total) by mouth every 8 (eight) hours as needed for nausea. (Patient not taking: Reported on 02/18/2016) 90 tablet 12  . ondansetron (ZOFRAN) 8 MG tablet Take twice a day starting the evenng of the day of chemo and continuing 2 full days (Patient not taking: Reported on 02/18/2016) 40 tablet 0  . oxyCODONE (OXY IR/ROXICODONE) 5 MG immediate release tablet Take 1-2 tablets (5-10 mg total) by mouth every 6 (six) hours as needed for severe pain. 100 tablet 0  . polyethylene glycol powder (GLYCOLAX/MIRALAX) powder Take 255 g (1 Container total) by mouth daily. (Patient taking differently: Take 1 Container by mouth daily as needed for mild constipation or moderate constipation. ) 255 g 3  . potassium chloride SA (K-DUR,KLOR-CON) 20 MEQ tablet Take 1 tablet (20 mEq  total) by mouth 2 (two) times daily. 180 tablet 1  . pregabalin (LYRICA) 75 MG capsule Take 1 capsule (75 mg total) by mouth 2 (two) times daily. Not taking (Patient taking differently: Take 75 mg by mouth 2 (two) times daily as needed (bone pain). Not taking) 60 capsule 5  . prochlorperazine (COMPAZINE) 10 MG tablet Take one tablet three times a day before meals tarting the evening of chemo and continuing for an additional two days (Patient not taking: Reported on 02/18/2016) 30 tablet 0  . prochlorperazine (COMPAZINE) 10 MG tablet Take 1 tablet (10 mg total) by mouth every 6 (six) hours as needed (Nausea or vomiting). 30 tablet 1   No current facility-administered medications for this visit.    PHYSICAL EXAM: Elderly African American woman In no acute distress Vitals:   04/07/16 1251  BP: 136/90  Pulse: 72  Resp: 18  Temp: 98.5 F (36.9 C)   Body mass index is 31.33 kg/m.    ECOG FS: 1 Filed Weights   04/07/16 1251  Weight: 160 lb 6.4 oz (72.8 kg)   Sclerae unicteric, pupils round and equal Oropharynx clear and moist-- no thrush or other lesions No cervical or supraclavicular adenopathy Lungs no rales or rhonchi Heart regular rate and rhythm Abd soft, obese, nontender, positive bowel sounds MSK no focal spinal tenderness, grade 3 right upper extremity lymphedema Neuro: nonfocal, well oriented, appropriate affect Breasts: Deferred  LAB RESULTS: Lab Results  Component Value Date   WBC 3.2 (L) 04/07/2016   NEUTROABS 1.1 (L) 04/07/2016   HGB 10.0 (L) 04/07/2016   HCT 31.6 (L) 04/07/2016   MCV 86.4 04/07/2016   PLT 232 04/07/2016      Chemistry      Component Value Date/Time   NA 144 04/07/2016 1150   K 3.3 (L) 04/07/2016 1150   CL 107 02/21/2016 0410   CL 105 01/23/2013 1337   CO2 26 04/07/2016 1150   BUN 10.5 04/07/2016 1150   CREATININE 0.6 04/07/2016 1150      Component Value Date/Time   CALCIUM 9.6 04/07/2016 1150   ALKPHOS 76 04/07/2016 1150   AST 23  04/07/2016 1150   ALT <  9 04/07/2016 1150   BILITOT 0.59 04/07/2016 1150       Lab Results  Component Value Date   LABCA2 24 11/19/2011    STUDIES: No results found.   ASSESSMENT: 75 y.o.  [place] woman with stage IV breast cancer as of May 2014  (1)  status post right lower outer quadrant lumpectomy and axillary lymph node dissection 11/17/2010 for a pT2 pN1, stage IIB invasive ductal carcinoma, grade 2, estrogen and progesterone receptor positive, HER-2 negative,   (2) Oncotype DX recurrence score of 27 predicting a risk of distant recurrence of 18% with 5 years of tamoxifen (intermediate score);   (3)  status post radiation completed July of 2012,   (4) started anastrozole July 2012, discontinued June 2014 with evidence of metastatic spread to bone  (5) Chronic lymphedema in the right upper extremity.  (6) pathologic fracture of the left humerus along apparent lytic lesion, with no other bone lesions per bone scan 12/12/2012  (7) on 12/27/2012 underwent #1 intramedullary nail left pathologic proximal humerus fracture  #2 left shoulder rotator cuff repair  #3 left shoulder open bone biopsy with pathology showing metastatic breast adenocarcinoma, estrogen receptor 100% positive, progesterone receptor and HER-2 negative, with an MIB-1 of 26%. PET scan 01/13/2013 showed no other areas of disease   (8) status post 35 Gy to the left proximal humerus completed 03/13/2013(  (9) fulvestrant, started on 01/23/2013, stopped May 2017 with progression  (10) zolendronic acid given monthly, first dose 01/23/2013; changed to denosumab as of August 2015 because of access problems, being given every three months  (a) May 2017 dose help after several extractions  (11) multiple liver lesions noted on scans April 2015   (12) letrozole added May 2015, stopped 04/18/14 because of progression in her liver noted on a chest CT performed on 03/14/14  (13) everolimus and exemestane started  04/19/14, stopped May 2017  (a) MRI of the abdomen 07/12/15 showed stable disease  (b) repeat abdominal MRI 12/04/2015 showed progression  (14) started CMF chemotherapy (cyclophosphamide, methotrexate, fluorouracil) 12/24/2015  (a) admitted 02/18/2016 with febrile neutropenia, negative cultures  (b) axillofacial CT scan 02/19/2016 shows no soft-tissue abscessbut lucencies in the roots of multiple upper and lower teeth may indicate periapical abscesses  (c) MRI of the liver 02/26/2016 shows disease progression: CMF discontinued  (15) started eribulin 03/31/2016, to be repeated days 1 and 8 of each cycle  PLAN: I spent approximately 40 minutes with Ms. Para March and her daughter today because this was the first time her daughter had been able to make it to one of her meeting since she had many questions. We reviewed Lilyanna's treatment and diagnosis history all the way back to 2012 and I gave her written copy of that. We also discussed the current treatments and what we hope it's going to achieve  Incidentally the daughter [name] was diagnosed with a high-grade astrocytoma 2 years ago. She is on temozolomide and has monthly MR[place] in [place] where she lives. She understands her tumor is very likely to recur.  The patient however tolerated her first dose of eribulin quite well and she is proceeding to the day 8 treatment today. Her neutrophils are only 1.1. Accordingly I am adding on Pro to her treatment. She will have lab work next week just to see how low her counts went that of course she understands to call us if any fever develops.  She will then see me on September 12 for the beginning of her second cycle.  The overall plan is to proceed to 3 cycles and then restage.  Ralph has a good understanding of this plan. She agrees with it. She will call with any problems that may develop before the next visit.Marland Kitchen      Chauncey Cruel, MD  04/07/16

## 2016-04-07 NOTE — Patient Instructions (Signed)
[facility] Discharge Instructions for Patients Receiving Chemotherapy  Today you received the following chemotherapy agents [place]  To help prevent nausea and vomiting after your treatment, we encourage you to take your nausea medication as prescribed by MD.   If you develop nausea and vomiting that is not controlled by your nausea medication, call the clinic.   BELOW ARE SYMPTOMS THAT SHOULD BE REPORTED IMMEDIATELY:  *FEVER GREATER THAN 100.5 F  *CHILLS WITH OR WITHOUT FEVER  NAUSEA AND VOMITING THAT IS NOT CONTROLLED WITH YOUR NAUSEA MEDICATION  *UNUSUAL SHORTNESS OF BREATH  *UNUSUAL BRUISING OR BLEEDING  TENDERNESS IN MOUTH AND THROAT WITH OR WITHOUT PRESENCE OF ULCERS  *URINARY PROBLEMS  *BOWEL PROBLEMS  UNUSUAL RASH Items with * indicate a potential emergency and should be followed up as soon as possible.  Feel free to call the clinic you have any questions or concerns. The clinic phone number is [phone].  Please show the [facility] at check-in to the Emergency Department and triage nurse.

## 2016-04-08 ENCOUNTER — Ambulatory Visit (HOSPITAL_BASED_OUTPATIENT_CLINIC_OR_DEPARTMENT_OTHER): Payer: Medicare Other

## 2016-04-08 VITALS — BP 125/64 | HR 77 | Temp 98.2°F | Resp 16

## 2016-04-08 DIAGNOSIS — C787 Secondary malignant neoplasm of liver and intrahepatic bile duct: Secondary | ICD-10-CM

## 2016-04-08 DIAGNOSIS — Z5189 Encounter for other specified aftercare: Secondary | ICD-10-CM

## 2016-04-08 DIAGNOSIS — C50512 Malignant neoplasm of lower-outer quadrant of left female breast: Secondary | ICD-10-CM

## 2016-04-08 DIAGNOSIS — C7951 Secondary malignant neoplasm of bone: Secondary | ICD-10-CM | POA: Diagnosis not present

## 2016-04-08 MED ORDER — PEGFILGRASTIM INJECTION 6 MG/0.6ML ~~LOC~~
6.0000 mg | PREFILLED_SYRINGE | Freq: Once | SUBCUTANEOUS | Status: AC
  Administered 2016-04-08: 6 mg via SUBCUTANEOUS
  Filled 2016-04-08: qty 0.6

## 2016-04-08 NOTE — Patient Instructions (Signed)
Pegfilgrastim injection What is this medicine? PEGFILGRASTIM (PEG fil gra stim) is a long-acting granulocyte colony-stimulating factor that stimulates the growth of neutrophils, a type of white blood cell important in the body's fight against infection. It is used to reduce the incidence of fever and infection in patients with certain types of cancer who are receiving chemotherapy that affects the bone marrow, and to increase survival after being exposed to high doses of radiation. This medicine may be used for other purposes; ask your health care provider or pharmacist if you have questions. What should I tell my health care provider before I take this medicine? They need to know if you have any of these conditions: -kidney disease -latex allergy -ongoing radiation therapy -sickle cell disease -skin reactions to acrylic adhesives (On-Body Injector only) -an unusual or allergic reaction to pegfilgrastim, filgrastim, other medicines, foods, dyes, or preservatives -pregnant or trying to get pregnant -breast-feeding How should I use this medicine? This medicine is for injection under the skin. If you get this medicine at home, you will be taught how to prepare and give the pre-filled syringe or how to use the On-body Injector. Refer to the patient Instructions for Use for detailed instructions. Use exactly as directed. Take your medicine at regular intervals. Do not take your medicine more often than directed. It is important that you put your used needles and syringes in a special sharps container. Do not put them in a trash can. If you do not have a sharps container, call your pharmacist or healthcare provider to get one. Talk to your pediatrician regarding the use of this medicine in children. While this drug may be prescribed for selected conditions, precautions do apply. Overdosage: If you think you have taken too much of this medicine contact a poison control center or emergency room at  once. NOTE: This medicine is only for you. Do not share this medicine with others. What if I miss a dose? It is important not to miss your dose. Call your doctor or health care professional if you miss your dose. If you miss a dose due to an On-body Injector failure or leakage, a new dose should be administered as soon as possible using a single prefilled syringe for manual use. What may interact with this medicine? Interactions have not been studied. Give your health care provider a list of all the medicines, herbs, non-prescription drugs, or dietary supplements you use. Also tell them if you smoke, drink alcohol, or use illegal drugs. Some items may interact with your medicine. This list may not describe all possible interactions. Give your health care provider a list of all the medicines, herbs, non-prescription drugs, or dietary supplements you use. Also tell them if you smoke, drink alcohol, or use illegal drugs. Some items may interact with your medicine. What should I watch for while using this medicine? You may need blood work done while you are taking this medicine. If you are going to need a MRI, CT scan, or other procedure, tell your doctor that you are using this medicine (On-Body Injector only). What side effects may I notice from receiving this medicine? Side effects that you should report to your doctor or health care professional as soon as possible: -allergic reactions like skin rash, itching or hives, swelling of the face, lips, or tongue -dizziness -fever -pain, redness, or irritation at site where injected -pinpoint red spots on the skin -red or dark-brown urine -shortness of breath or breathing problems -stomach or side pain, or pain   at the shoulder -swelling -tiredness -trouble passing urine or change in the amount of urine Side effects that usually do not require medical attention (report to your doctor or health care professional if they continue or are  bothersome): -bone pain -muscle pain This list may not describe all possible side effects. Call your doctor for medical advice about side effects. You may report side effects to FDA at 1-800-FDA-1088. Where should I keep my medicine? Keep out of the reach of children. Store pre-filled syringes in a refrigerator between 2 and 8 degrees C (36 and 46 degrees F). Do not freeze. Keep in carton to protect from light. Throw away this medicine if it is left out of the refrigerator for more than 48 hours. Throw away any unused medicine after the expiration date. NOTE: This sheet is a summary. It may not cover all possible information. If you have questions about this medicine, talk to your doctor, pharmacist, or health care provider.    2016, Elsevier/Gold Standard. (2014-08-16 14:30:14)  

## 2016-04-10 ENCOUNTER — Other Ambulatory Visit: Payer: Self-pay | Admitting: *Deleted

## 2016-04-10 DIAGNOSIS — C50512 Malignant neoplasm of lower-outer quadrant of left female breast: Secondary | ICD-10-CM

## 2016-04-10 DIAGNOSIS — C787 Secondary malignant neoplasm of liver and intrahepatic bile duct: Secondary | ICD-10-CM

## 2016-04-13 ENCOUNTER — Other Ambulatory Visit: Payer: Self-pay | Admitting: Oncology

## 2016-04-13 NOTE — Progress Notes (Signed)
ID: [name]   DOB: 1941-03-06  MR#: [id number]  WJX#:[id number]  PCP: [name], MD GYN:  SU:  OTHER MD:  Alysia Penna, Frederik Pear, Mellody Life DDS  CC: Breast cancer stage IV  TREATMENT: eribulin, [denosumab]  BREAST CANCER HISTORY: From the original intake note:  The patient had routine screening mammography 10/16/2010 showing a lobulated mass in the right subareolar region measuring up to 5.5 cm. The Left breast showed some central microcalcifications. Biopsy of the Right breast mass on 10/28/2010 showed an invasive ductal carcinoma, grade 2, estrogen receptor positive (Allred score 8), progesterone receptor positive (Allred score 5), with an equivocal HER-2. The Left breast area of microcalcifications was biopsied at the same time, and was read as suspicious for DCIS.  On 11/17/2010 the patient underwent Right lumpectomy and axillary lymph node dissection for what proved to be a 3 cm invasive ductal carcinoma, grade 2, with some papillary and mucinous features. One of 16 lymph nodes was involved. FISH showed no HER-2 amplification. Left breast biopsy was benign.  The patient had an Oncotype sent, with a score of 27, predicting a risk of distant recurrence after 5 years of tamoxifen in the 18% range. With this intermediate result, the decision was made not to proceed with chemotherapy, since the patient is the sole caregiver to her very elderly mother. Instead the patient proceeded to radiation treatment which was completed 03/06/2011. She started anastrozole at that point. Her subsequent history is as detailed below   INTERVAL HISTORY: Tanayah returns today for follow up of her stage IV breast cancer accompanied by her daughter [name]. This is day 8 cycle 1 of eribulin which she receives day 1 and 8 of each 21 day cycle.  [name] is visiting from [place] and this is the first time that I have been able to meet  her.  REVIEW OF SYSTEMS: Keondria i did remarkably well with her first dose of eribulin. She had plenty of energy. She ate better. She did some housework. She had no nausea or vomiting, no mouth sores, no cough or phlegm production, and no change in bowel or bladder habits. Of course she continues to have arthritis pains here and there but there are not any more persistent or intense than prior. A detailed review of systems today was otherwise stable  PAST MEDICAL HISTORY: Past Medical History:  Diagnosis Date  . Arthritis   . Cancer ([place])    right breast  . Club foot   . Diabetes mellitus    metformin  . GERD (gastroesophageal reflux disease)    watches diet  . History of radiation therapy 02/22/13- 03/13/13   left proximal humerus 3500 cGy 14 sessions  . Hyperlipidemia   . Hypertension   . Lymphedema    right arm  . Metastasis from malignant tumor of breast ([place])    left humerous  . Metastasis from malignant tumor of breast ([place])    liver  . Neuromuscular disorder ([facility])    lt arm numb sometimes  . Osteoporosis   . Thyroid disease   . Wears glasses     PAST SURGICAL HISTORY: Past Surgical History:  Procedure Laterality Date  . BREAST SURGERY  2012   rt lump-16 nodes-in [place]  . CESAREAN SECTION    . CESAREAN SECTION    . COLONOSCOPY    . DILATION AND CURETTAGE OF UTERUS    . FOOT SURGERY     as a child  for club foot  . HUMERUS IM NAIL Left 12/27/2012   Procedure: INTRAMEDULLARY (IM) NAIL HUMERAL LEFT PATHOLOGIC FRACTURE;  Surgeon: Nita Sells, MD;  Location: [facility];  Service: Orthopedics;  Laterality: Left;  . THYROIDECTOMY  2002    FAMILY HISTORY Family History  Problem Relation Age of Onset  . Cancer Neg Hx   . Arthritis Neg Hx   . Heart disease Neg Hx   . Hyperlipidemia Neg Hx   . Hypertension Neg Hx   . Dementia Mother   . Hearing loss Mother    The patient's father died from unknown causes. The patient's mother is alive at  age 66. The patient was a single child. There is no history of breast or ovarian cancer in the family to her knowledge.  GYNECOLOGIC HISTORY: Menarche age 81, menopause in her early 7s. The patient is GX P1, first pregnancy to term age 40. She did not take hormone replacement  SOCIAL HISTORY: The patient grew up in [place], attended [employer], then moved to [place] where she lived about 40 years. 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 Her mother, [name] away at 100, on Aug 22, 2014. The patient's daughter [name], 34, is disabled secondary to pulmonary hypertension. She can be reached at [phone]. The patient has 2 grandchildren, [name] who works at a Scientist, physiological business and is 75 years old, in general, 51, completing high school.   ADVANCED DIRECTIVES:not in place  HEALTH MAINTENANCE: Social History  Substance Use Topics  . Smoking status: Former Smoker    Quit date: 12/23/1974  . Smokeless tobacco: Never Used  . Alcohol use No     Colonoscopy:  PAP:    Bone density:  2012, on ibandronate chronically  Lipid panel: UTD  Allergies  Allergen Reactions  . Bydureon [Exenatide] Rash  . Gadolinium Derivatives Other (See Comments)    Pt began having chest numbness/tingling , stated her heart felt funny, had to take her to ED for EKG per RN   CAP  . Enalapril Rash    Current Outpatient Prescriptions  Medication Sig Dispense Refill  . amoxicillin-clavulanate (AUGMENTIN) 875-125 MG tablet Take 1 tablet by mouth 2 (two) times daily. 8 tablet 0  . aspirin EC 81 MG tablet Take 1 tablet (81 mg total) by mouth daily.    Marland Kitchen atorvastatin (LIPITOR) 80 MG tablet Take 1 tablet (80 mg total) by mouth daily. 30 tablet 11  . docusate sodium (COLACE) 100 MG capsule Take 1 capsule (100 mg total) by mouth 2 (two) times daily as needed. Reported on 10/17/2015 (Patient taking differently: Take 100 mg by mouth 2 (two) times daily as needed for  mild constipation. Reported on 10/17/2015) 10 capsule 0  . doxepin (SINEQUAN) 25 MG capsule Take 2 capsules (50 mg total) by mouth at bedtime as needed. (Patient taking differently: Take 50 mg by mouth at bedtime as needed (sleep). ) 180 capsule 1  . fentaNYL (DURAGESIC - DOSED MCG/HR) 25 MCG/HR patch Place 1 patch (25 mcg total) onto the skin every 3 (three) days. 5 patch 0  . furosemide (LASIX) 40 MG tablet Take 1 tablet (40 mg total) by mouth daily. 90 tablet 2  . levofloxacin (LEVAQUIN) 500 MG tablet Take 1 tablet (500 mg total) by mouth daily. 4 tablet 0  . levothyroxine (SYNTHROID) 175 MCG tablet Take 1 tablet (175 mcg total) by mouth daily before breakfast. 90 tablet 1  . lubiprostone (AMITIZA)  24 MCG capsule Take 1 capsule (24 mcg total) by mouth 2 (two) times daily with a meal. (Patient taking differently: Take 24 mcg by mouth 2 (two) times daily as needed for constipation. ) 180 capsule 1  . magic mouthwash SOLN Take 5 mLs by mouth 4 (four) times daily as needed for mouth pain. 240 mL 3  . metFORMIN (GLUCOPHAGE) 500 MG tablet Take 1 tablet (500 mg total) by mouth daily. 90 tablet 1  . metoCLOPramide (REGLAN) 5 MG tablet Take 1 tablet (5 mg total) by mouth every 8 (eight) hours as needed for nausea. (Patient not taking: Reported on 02/18/2016) 90 tablet 12  . ondansetron (ZOFRAN) 8 MG tablet Take twice a day starting the evenng of the day of chemo and continuing 2 full days (Patient not taking: Reported on 02/18/2016) 40 tablet 0  . oxyCODONE (OXY IR/ROXICODONE) 5 MG immediate release tablet Take 1-2 tablets (5-10 mg total) by mouth every 6 (six) hours as needed for severe pain. 100 tablet 0  . polyethylene glycol powder (GLYCOLAX/MIRALAX) powder Take 255 g (1 Container total) by mouth daily. (Patient taking differently: Take 1 Container by mouth daily as needed for mild constipation or moderate constipation. ) 255 g 3  . potassium chloride SA (K-DUR,KLOR-CON) 20 MEQ tablet Take 1 tablet (20 mEq  total) by mouth 2 (two) times daily. 180 tablet 1  . pregabalin (LYRICA) 75 MG capsule Take 1 capsule (75 mg total) by mouth 2 (two) times daily. Not taking (Patient taking differently: Take 75 mg by mouth 2 (two) times daily as needed (bone pain). Not taking) 60 capsule 5  . prochlorperazine (COMPAZINE) 10 MG tablet Take one tablet three times a day before meals tarting the evening of chemo and continuing for an additional two days (Patient not taking: Reported on 02/18/2016) 30 tablet 0  . prochlorperazine (COMPAZINE) 10 MG tablet Take 1 tablet (10 mg total) by mouth every 6 (six) hours as needed (Nausea or vomiting). 30 tablet 1   No current facility-administered medications for this visit.    PHYSICAL EXAM: Elderly African American woman In no acute distress There were no vitals filed for this visit. There is no height or weight on file to calculate BMI.    ECOG FS: 1 There were no vitals filed for this visit. Sclerae unicteric, pupils round and equal Oropharynx clear and moist-- no thrush or other lesions No cervical or supraclavicular adenopathy Lungs no rales or rhonchi Heart regular rate and rhythm Abd soft, obese, nontender, positive bowel sounds MSK no focal spinal tenderness, grade 3 right upper extremity lymphedema Neuro: nonfocal, well oriented, appropriate affect Breasts: Deferred  LAB RESULTS: Lab Results  Component Value Date   WBC 3.2 (L) 04/07/2016   NEUTROABS 1.1 (L) 04/07/2016   HGB 10.0 (L) 04/07/2016   HCT 31.6 (L) 04/07/2016   MCV 86.4 04/07/2016   PLT 232 04/07/2016      Chemistry      Component Value Date/Time   NA 144 04/07/2016 1150   K 3.3 (L) 04/07/2016 1150   CL 107 02/21/2016 0410   CL 105 01/23/2013 1337   CO2 26 04/07/2016 1150   BUN 10.5 04/07/2016 1150   CREATININE 0.6 04/07/2016 1150      Component Value Date/Time   CALCIUM 9.6 04/07/2016 1150   ALKPHOS 76 04/07/2016 1150   AST 23 04/07/2016 1150   ALT <9 04/07/2016 1150   BILITOT 0.59  04/07/2016 1150       Lab Results  Component Value Date   LABCA2 24 11/19/2011    STUDIES: No results found.   ASSESSMENT: 75 y.o.  [place] woman with stage IV breast cancer as of May 2014  (1)  status post right lower outer quadrant lumpectomy and axillary lymph node dissection 11/17/2010 for a pT2 pN1, stage IIB invasive ductal carcinoma, grade 2, estrogen and progesterone receptor positive, HER-2 negative,   (2) Oncotype DX recurrence score of 27 predicting a risk of distant recurrence of 18% with 5 years of tamoxifen (intermediate score);   (3)  status post radiation completed July of 2012,   (4) started anastrozole July 2012, discontinued June 2014 with evidence of metastatic spread to bone  (5) Chronic lymphedema in the right upper extremity.  (6) pathologic fracture of the left humerus along apparent lytic lesion, with no other bone lesions per bone scan 12/12/2012  (7) on 12/27/2012 underwent #1 intramedullary nail left pathologic proximal humerus fracture  #2 left shoulder rotator cuff repair  #3 left shoulder open bone biopsy with pathology showing metastatic breast adenocarcinoma, estrogen receptor 100% positive, progesterone receptor and HER-2 negative, with an MIB-1 of 26%. PET scan 01/13/2013 showed no other areas of disease   (8) status post 35 Gy to the left proximal humerus completed 03/13/2013(  (9) fulvestrant, started on 01/23/2013, stopped May 2017 with progression  (10) zolendronic acid given monthly, first dose 01/23/2013; changed to denosumab as of August 2015 because of access problems, being given every three months  (a) May 2017 dose help after several extractions  (11) multiple liver lesions noted on scans April 2015   (12) letrozole added May 2015, stopped 04/18/14 because of progression in her liver noted on a chest CT performed on 03/14/14  (13) everolimus and exemestane started 04/19/14, stopped May 2017  (a) MRI of the abdomen 07/12/15  showed stable disease  (b) repeat abdominal MRI 12/04/2015 showed progression  (14) started CMF chemotherapy (cyclophosphamide, methotrexate, fluorouracil) 12/24/2015  (a) admitted 02/18/2016 with febrile neutropenia, negative cultures  (b) axillofacial CT scan 02/19/2016 shows no soft-tissue abscessbut lucencies in the roots of multiple upper and lower teeth may indicate periapical abscesses  (c) MRI of the liver 02/26/2016 shows disease progression: CMF discontinued  (15) started eribulin 03/31/2016, to be repeated days 1 and 8 of each cycle  PLAN: I spent approximately 40 minutes with Ms. Para March and her daughter today because this was the first time her daughter had been able to make it to one of her meeting since she had many questions. We reviewed Arushi's treatment and diagnosis history all the way back to 2012 and I gave her written copy of that. We also discussed the current treatments and what we hope it's going to achieve  Incidentally the daughter [name] was diagnosed with a high-grade astrocytoma 2 years ago. She is on temozolomide and [place] monthly [place] in [place]. She understands her tumor is very likely to recur.  The patient however tolerated her first dose of eribulin quite well and she is proceeding to the day 8 treatment today. Her neutrophils are only 1.1. Accordingly I am adding on Pro to her treatment. She will have lab work next week just to see how low her counts went that of course she understands to call us if any fever develops.  She will then see me on September 12 for the beginning of her second cycle. The overall plan is to proceed to 3 cycles and then restage.  Aarilyn has a good understanding  of this plan. She agrees with it. She will call with any problems that may develop before the next visit.Marland Kitchen      Chauncey Cruel, MD  04/13/16

## 2016-04-14 ENCOUNTER — Other Ambulatory Visit (HOSPITAL_BASED_OUTPATIENT_CLINIC_OR_DEPARTMENT_OTHER): Payer: Medicare Other

## 2016-04-14 ENCOUNTER — Ambulatory Visit (HOSPITAL_BASED_OUTPATIENT_CLINIC_OR_DEPARTMENT_OTHER): Payer: Medicare Other

## 2016-04-14 DIAGNOSIS — C787 Secondary malignant neoplasm of liver and intrahepatic bile duct: Secondary | ICD-10-CM

## 2016-04-14 DIAGNOSIS — C50512 Malignant neoplasm of lower-outer quadrant of left female breast: Secondary | ICD-10-CM | POA: Diagnosis not present

## 2016-04-14 DIAGNOSIS — C7951 Secondary malignant neoplasm of bone: Secondary | ICD-10-CM

## 2016-04-14 DIAGNOSIS — Z95828 Presence of other vascular implants and grafts: Secondary | ICD-10-CM

## 2016-04-14 LAB — COMPREHENSIVE METABOLIC PANEL
ALBUMIN: 3.1 g/dL — AB (ref 3.5–5.0)
ALK PHOS: 100 U/L (ref 40–150)
ALT: 9 U/L (ref 0–55)
ANION GAP: 11 meq/L (ref 3–11)
AST: 24 U/L (ref 5–34)
BILIRUBIN TOTAL: 0.5 mg/dL (ref 0.20–1.20)
BUN: 6.1 mg/dL — ABNORMAL LOW (ref 7.0–26.0)
CALCIUM: 9.5 mg/dL (ref 8.4–10.4)
CO2: 27 mEq/L (ref 22–29)
CREATININE: 0.7 mg/dL (ref 0.6–1.1)
Chloride: 105 mEq/L (ref 98–109)
EGFR: >90 mL/min/{1.73_m2} (ref >90)
Glucose: 95 mg/dl (ref 70–140)
Potassium: 3.2 mEq/L — ABNORMAL LOW (ref 3.5–5.1)
Sodium: 144 mEq/L (ref 136–145)
TOTAL PROTEIN: 7 g/dL (ref 6.4–8.3)

## 2016-04-14 LAB — CBC WITH DIFFERENTIAL/PLATELET
BASO%: 0.9 % (ref 0.0–2.0)
Basophils Absolute: 0.1 10*3/uL (ref 0.0–0.1)
EOS ABS: 0 10*3/uL (ref 0.0–0.5)
EOS%: 0 % (ref 0.0–7.0)
HEMATOCRIT: 31.3 % — AB (ref 34.8–46.6)
HGB: 10.2 g/dL — ABNORMAL LOW (ref 11.6–15.9)
LYMPH#: 3.9 10*3/uL — AB (ref 0.9–3.3)
LYMPH%: 23.7 % (ref 14.0–49.7)
MCH: 28.3 pg (ref 25.1–34.0)
MCHC: 32.6 g/dL (ref 31.5–36.0)
MCV: 86.7 fL (ref 79.5–101.0)
MONO#: 2.9 10*3/uL — AB (ref 0.1–0.9)
MONO%: 17.7 % — ABNORMAL HIGH (ref 0.0–14.0)
NEUT%: 57.7 % (ref 38.4–76.8)
NEUTROS ABS: 9.5 10*3/uL — AB (ref 1.5–6.5)
PLATELETS: 238 10*3/uL (ref 145–400)
RBC: 3.61 10*6/uL — ABNORMAL LOW (ref 3.70–5.45)
RDW: 17.6 % — ABNORMAL HIGH (ref 11.2–14.5)
WBC: 16.4 10*3/uL — AB (ref 3.9–10.3)

## 2016-04-14 MED ORDER — SODIUM CHLORIDE 0.9 % IJ SOLN
10.0000 mL | INTRAMUSCULAR | Status: DC | PRN
  Administered 2016-04-14: 10 mL via INTRAVENOUS
  Filled 2016-04-14: qty 10

## 2016-04-14 MED ORDER — HEPARIN SOD (PORK) LOCK FLUSH 100 UNIT/ML IV SOLN
500.0000 [IU] | Freq: Once | INTRAVENOUS | Status: AC | PRN
  Administered 2016-04-14: 500 [IU] via INTRAVENOUS
  Filled 2016-04-14: qty 5

## 2016-04-14 NOTE — Patient Instructions (Signed)

## 2016-04-20 ENCOUNTER — Other Ambulatory Visit: Payer: Self-pay | Admitting: *Deleted

## 2016-04-20 DIAGNOSIS — C7951 Secondary malignant neoplasm of bone: Secondary | ICD-10-CM

## 2016-04-21 ENCOUNTER — Ambulatory Visit (HOSPITAL_BASED_OUTPATIENT_CLINIC_OR_DEPARTMENT_OTHER): Payer: Medicare Other | Admitting: Oncology

## 2016-04-21 ENCOUNTER — Ambulatory Visit (HOSPITAL_BASED_OUTPATIENT_CLINIC_OR_DEPARTMENT_OTHER): Payer: Medicare Other

## 2016-04-21 ENCOUNTER — Ambulatory Visit: Payer: Medicare Other

## 2016-04-21 ENCOUNTER — Telehealth: Payer: Self-pay

## 2016-04-21 ENCOUNTER — Other Ambulatory Visit (HOSPITAL_BASED_OUTPATIENT_CLINIC_OR_DEPARTMENT_OTHER): Payer: Medicare Other

## 2016-04-21 DIAGNOSIS — C787 Secondary malignant neoplasm of liver and intrahepatic bile duct: Secondary | ICD-10-CM | POA: Diagnosis not present

## 2016-04-21 DIAGNOSIS — C7951 Secondary malignant neoplasm of bone: Secondary | ICD-10-CM

## 2016-04-21 DIAGNOSIS — Z95828 Presence of other vascular implants and grafts: Secondary | ICD-10-CM

## 2016-04-21 DIAGNOSIS — C50512 Malignant neoplasm of lower-outer quadrant of left female breast: Secondary | ICD-10-CM

## 2016-04-21 DIAGNOSIS — C50511 Malignant neoplasm of lower-outer quadrant of right female breast: Secondary | ICD-10-CM

## 2016-04-21 DIAGNOSIS — IMO0002 Reserved for concepts with insufficient information to code with codable children: Secondary | ICD-10-CM

## 2016-04-21 DIAGNOSIS — G893 Neoplasm related pain (acute) (chronic): Secondary | ICD-10-CM

## 2016-04-21 DIAGNOSIS — E1165 Type 2 diabetes mellitus with hyperglycemia: Principal | ICD-10-CM

## 2016-04-21 DIAGNOSIS — E1140 Type 2 diabetes mellitus with diabetic neuropathy, unspecified: Secondary | ICD-10-CM

## 2016-04-21 LAB — COMPREHENSIVE METABOLIC PANEL
ALBUMIN: 3 g/dL — AB (ref 3.5–5.0)
ALK PHOS: 93 U/L (ref 40–150)
ALT: 10 U/L (ref 0–55)
AST: 16 U/L (ref 5–34)
Anion Gap: 10 mEq/L (ref 3–11)
BILIRUBIN TOTAL: 0.38 mg/dL (ref 0.20–1.20)
BUN: 9.7 mg/dL (ref 7.0–26.0)
CALCIUM: 9.2 mg/dL (ref 8.4–10.4)
CO2: 26 mEq/L (ref 22–29)
Chloride: 107 mEq/L (ref 98–109)
Creatinine: 0.7 mg/dL (ref 0.6–1.1)
EGFR: >90 mL/min/{1.73_m2} (ref >90)
GLUCOSE: 86 mg/dL (ref 70–140)
Potassium: 3.8 mEq/L (ref 3.5–5.1)
SODIUM: 143 meq/L (ref 136–145)
TOTAL PROTEIN: 6.9 g/dL (ref 6.4–8.3)

## 2016-04-21 LAB — CBC & DIFF AND RETIC
BASO%: 0.2 % (ref 0.0–2.0)
BASOS ABS: 0 10*3/uL (ref 0.0–0.1)
EOS ABS: 0 10*3/uL (ref 0.0–0.5)
EOS%: 0 % (ref 0.0–7.0)
HEMATOCRIT: 33 % — AB (ref 34.8–46.6)
HEMOGLOBIN: 10.4 g/dL — AB (ref 11.6–15.9)
IMMATURE RETIC FRACT: 11.3 % — AB (ref 1.60–10.00)
LYMPH%: 23.8 % (ref 14.0–49.7)
MCH: 27.9 pg (ref 25.1–34.0)
MCHC: 31.5 g/dL (ref 31.5–36.0)
MCV: 88.5 fL (ref 79.5–101.0)
MONO#: 1 10*3/uL — AB (ref 0.1–0.9)
MONO%: 9.9 % (ref 0.0–14.0)
NEUT%: 66.1 % (ref 38.4–76.8)
NEUTROS ABS: 6.9 10*3/uL — AB (ref 1.5–6.5)
Platelets: 227 10*3/uL (ref 145–400)
RBC: 3.73 10*6/uL (ref 3.70–5.45)
RDW: 19.2 % — ABNORMAL HIGH (ref 11.2–14.5)
RETIC %: 2.82 % — AB (ref 0.70–2.10)
Retic Ct Abs: 105.19 10*3/uL — ABNORMAL HIGH (ref 33.70–90.70)
WBC: 10.5 10*3/uL — AB (ref 3.9–10.3)
lymph#: 2.5 10*3/uL (ref 0.9–3.3)

## 2016-04-21 MED ORDER — SODIUM CHLORIDE 0.9 % IV SOLN
Freq: Once | INTRAVENOUS | Status: AC
  Administered 2016-04-21: 15:00:00 via INTRAVENOUS

## 2016-04-21 MED ORDER — PROCHLORPERAZINE MALEATE 10 MG PO TABS
10.0000 mg | ORAL_TABLET | Freq: Once | ORAL | Status: AC
  Administered 2016-04-21: 10 mg via ORAL

## 2016-04-21 MED ORDER — SODIUM CHLORIDE 0.9 % IJ SOLN
10.0000 mL | INTRAMUSCULAR | Status: DC | PRN
  Administered 2016-04-21: 10 mL via INTRAVENOUS
  Filled 2016-04-21: qty 10

## 2016-04-21 MED ORDER — PROCHLORPERAZINE MALEATE 10 MG PO TABS
ORAL_TABLET | ORAL | Status: AC
  Filled 2016-04-21: qty 1

## 2016-04-21 MED ORDER — SODIUM CHLORIDE 0.9 % IV SOLN
1.4300 mg/m2 | Freq: Once | INTRAVENOUS | Status: AC
  Administered 2016-04-21: 2.5 mg via INTRAVENOUS
  Filled 2016-04-21: qty 1

## 2016-04-21 MED ORDER — PREGABALIN 75 MG PO CAPS: 75.0000 mg | ORAL_CAPSULE | Freq: Two times a day (BID) | ORAL | 5 refills | Status: DC

## 2016-04-21 MED ORDER — HEPARIN SOD (PORK) LOCK FLUSH 100 UNIT/ML IV SOLN
500.0000 [IU] | Freq: Once | INTRAVENOUS | Status: AC | PRN
  Administered 2016-04-21: 500 [IU]
  Filled 2016-04-21: qty 5

## 2016-04-21 MED ORDER — SODIUM CHLORIDE 0.9% FLUSH
10.0000 mL | INTRAVENOUS | Status: DC | PRN
  Administered 2016-04-21: 10 mL
  Filled 2016-04-21: qty 10

## 2016-04-21 NOTE — Telephone Encounter (Signed)
lvm lyrica rx was faxed to [facility]

## 2016-04-21 NOTE — Progress Notes (Signed)
ID: Christy Harrell   DOB: April 12, 1941  MR#: [id number]  ZDG#:[id number]  PCP: [name], MD GYN:  SU:  OTHER MD:  Alysia Penna, Frederik Pear, Mellody Life DDS  CC: Breast cancer stage IV  TREATMENT: eribulin, [denosumab]  BREAST CANCER HISTORY: From the original intake note:  The patient had routine screening mammography 10/16/2010 showing a lobulated mass in the right subareolar region measuring up to 5.5 cm. The Left breast showed some central microcalcifications. Biopsy of the Right breast mass on 10/28/2010 showed an invasive ductal carcinoma, grade 2, estrogen receptor positive (Allred score 8), progesterone receptor positive (Allred score 5), with an equivocal HER-2. The Left breast area of microcalcifications was biopsied at the same time, and was read as suspicious for DCIS.  On 11/17/2010 the patient underwent Right lumpectomy and axillary lymph node dissection for what proved to be a 3 cm invasive ductal carcinoma, grade 2, with some papillary and mucinous features. One of 16 lymph nodes was involved. FISH showed no HER-2 amplification. Left breast biopsy was benign.  The patient had an Oncotype sent, with a score of 27, predicting a risk of distant recurrence after 5 years of tamoxifen in the 18% range. With this intermediate result, the decision was made not to proceed with chemotherapy, since the patient is the sole caregiver to her very elderly mother. Instead the patient proceeded to radiation treatment which was completed 03/06/2011. She started anastrozole at that point. Her subsequent history is as detailed below   INTERVAL HISTORY: Christy Harrell returns today for follow up of her metastatic breast cancer. This is day 1 cycle 2 of eribulin which she receives day 1 and 8 of each 21 day cycle.  Her daughter Christy Harrell i return to [place] last week. The patient tells me she was glad the daughter was able to visit  here and get some idea of what her situation currently is  REVIEW OF SYSTEMS: [name] tolerated the Neulasta without any significant bony aches or pains. She has begun to lose her hair. She is planning to use a hairpiece and some by [employer]. She has had no nausea or vomiting problems. She tells me she has had more energy since starting the chemotherapy. She has a little bit of a runny nose and some sinus symptoms. Sometimes her ankles swell. She has baseline arthritis. Otherwise a detailed review of systems today was stable  PAST MEDICAL HISTORY: Past Medical History:  Diagnosis Date  . Arthritis   . Cancer ([facility])    right breast  . Club foot   . Diabetes mellitus    metformin  . GERD (gastroesophageal reflux disease)    watches diet  . History of radiation therapy 02/22/13- 03/13/13   left proximal humerus 3500 cGy 14 sessions  . Hyperlipidemia   . Hypertension   . Lymphedema    right arm  . Metastasis from malignant tumor of breast ([facility])    left humerous  . Metastasis from malignant tumor of breast ([place])    liver  . Neuromuscular disorder ([facility])    lt arm numb sometimes  . Osteoporosis   . Thyroid disease   . Wears glasses     PAST SURGICAL HISTORY: Past Surgical History:  Procedure Laterality Date  . BREAST SURGERY  2012   rt lump-16 nodes-in [place]  . CESAREAN SECTION    . CESAREAN SECTION    . COLONOSCOPY    . DILATION AND CURETTAGE OF UTERUS    .  FOOT SURGERY     as a child for club foot  . HUMERUS IM NAIL Left 12/27/2012   Procedure: INTRAMEDULLARY (IM) NAIL HUMERAL LEFT PATHOLOGIC FRACTURE;  Surgeon: Nita Sells, MD;  Location: [facility];  Service: Orthopedics;  Laterality: Left;  . THYROIDECTOMY  2002    FAMILY HISTORY Family History  Problem Relation Age of Onset  . Cancer Neg Hx   . Arthritis Neg Hx   . Heart disease Neg Hx   . Hyperlipidemia Neg Hx   . Hypertension Neg Hx   . Dementia Mother   . Hearing loss Mother    The  patient's father died from unknown causes. The patient's mother is alive at age 45. The patient was a single child. There is no history of breast or ovarian cancer in the family to her knowledge.  GYNECOLOGIC HISTORY: Menarche age 20, menopause in her early 52s. The patient is GX P1, first pregnancy to term age 53. She did not take hormone replacement  SOCIAL HISTORY: The patient grew up in [place], attended [employer], then moved to [place] where she lived about 40 years. 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 Her mother, Christy Harrell away at 100, on 2014/09/11. The patient's daughter Christy Harrell, 80, is disabled secondary to pulmonary hypertension. She can be reached at [phone]. The patient has 2 grandchildren, Christy Harrell who works at a Scientist, physiological business and is 75 years old, in general, 17, completing high school.   ADVANCED DIRECTIVES:not in place  HEALTH MAINTENANCE: Social History  Substance Use Topics  . Smoking status: Former Smoker    Quit date: 12/23/1974  . Smokeless tobacco: Never Used  . Alcohol use No     Colonoscopy:  PAP:    Bone density:  2012, on ibandronate chronically  Lipid panel: UTD  Allergies  Allergen Reactions  . Bydureon [Exenatide] Rash  . Gadolinium Derivatives Other (See Comments)    Pt began having chest numbness/tingling , stated her heart felt funny, had to take her to ED for EKG per RN   CAP  . Enalapril Rash    Current Outpatient Prescriptions  Medication Sig Dispense Refill  . amoxicillin-clavulanate (AUGMENTIN) 875-125 MG tablet Take 1 tablet by mouth 2 (two) times daily. 8 tablet 0  . aspirin EC 81 MG tablet Take 1 tablet (81 mg total) by mouth daily.    Marland Kitchen atorvastatin (LIPITOR) 80 MG tablet Take 1 tablet (80 mg total) by mouth daily. 30 tablet 11  . docusate sodium (COLACE) 100 MG capsule Take 1 capsule (100 mg total) by mouth 2 (two) times daily as needed. Reported on 10/17/2015  (Patient taking differently: Take 100 mg by mouth 2 (two) times daily as needed for mild constipation. Reported on 10/17/2015) 10 capsule 0  . doxepin (SINEQUAN) 25 MG capsule Take 2 capsules (50 mg total) by mouth at bedtime as needed. (Patient taking differently: Take 50 mg by mouth at bedtime as needed (sleep). ) 180 capsule 1  . fentaNYL (DURAGESIC - DOSED MCG/HR) 25 MCG/HR patch Place 1 patch (25 mcg total) onto the skin every 3 (three) days. 5 patch 0  . furosemide (LASIX) 40 MG tablet Take 1 tablet (40 mg total) by mouth daily. 90 tablet 2  . levofloxacin (LEVAQUIN) 500 MG tablet Take 1 tablet (500 mg total) by mouth daily. 4 tablet 0  . levothyroxine (SYNTHROID) 175 MCG tablet Take 1 tablet (175 mcg total) by mouth daily  before breakfast. 90 tablet 1  . lubiprostone (AMITIZA) 24 MCG capsule Take 1 capsule (24 mcg total) by mouth 2 (two) times daily with a meal. (Patient taking differently: Take 24 mcg by mouth 2 (two) times daily as needed for constipation. ) 180 capsule 1  . magic mouthwash SOLN Take 5 mLs by mouth 4 (four) times daily as needed for mouth pain. 240 mL 3  . metFORMIN (GLUCOPHAGE) 500 MG tablet Take 1 tablet (500 mg total) by mouth daily. 90 tablet 1  . metoCLOPramide (REGLAN) 5 MG tablet Take 1 tablet (5 mg total) by mouth every 8 (eight) hours as needed for nausea. (Patient not taking: Reported on 02/18/2016) 90 tablet 12  . ondansetron (ZOFRAN) 8 MG tablet Take twice a day starting the evenng of the day of chemo and continuing 2 full days (Patient not taking: Reported on 02/18/2016) 40 tablet 0  . oxyCODONE (OXY IR/ROXICODONE) 5 MG immediate release tablet Take 1-2 tablets (5-10 mg total) by mouth every 6 (six) hours as needed for severe pain. 100 tablet 0  . polyethylene glycol powder (GLYCOLAX/MIRALAX) powder Take 255 g (1 Container total) by mouth daily. (Patient taking differently: Take 1 Container by mouth daily as needed for mild constipation or moderate constipation. ) 255 g  3  . potassium chloride SA (K-DUR,KLOR-CON) 20 MEQ tablet Take 1 tablet (20 mEq total) by mouth 2 (two) times daily. 180 tablet 1  . pregabalin (LYRICA) 75 MG capsule Take 1 capsule (75 mg total) by mouth 2 (two) times daily. Not taking 60 capsule 5  . prochlorperazine (COMPAZINE) 10 MG tablet Take one tablet three times a day before meals tarting the evening of chemo and continuing for an additional two days (Patient not taking: Reported on 02/18/2016) 30 tablet 0  . prochlorperazine (COMPAZINE) 10 MG tablet Take 1 tablet (10 mg total) by mouth every 6 (six) hours as needed (Nausea or vomiting). 30 tablet 1   No current facility-administered medications for this visit.    Facility-Administered Medications Ordered in Other Visits  Medication Dose Route Frequency Provider Last Rate Last Dose  . sodium chloride flush (NS) 0.9 % injection 10 mL  10 mL Intracatheter PRN Chauncey Cruel, MD   10 mL at 04/21/16 1620   PHYSICAL EXAM: Elderly African American woman who appears stated age Vitals:   04/21/16 1326  BP: 110/62  Pulse: 100  Resp: 18  Temp: 98.4 F (36.9 C)   Body mass index is 31.54 kg/m.    ECOG FS: 1 Filed Weights   04/21/16 1326  Weight: 161 lb 8 oz (73.3 kg)   Sclerae unicteric, EOMs intact Oropharynx clear and moist No cervical or supraclavicular adenopathy Lungs no rales or rhonchi Heart regular rate and rhythm Abd soft, obese, nontender, positive bowel sounds MSK no focal spinal tenderness, no upper extremity lymphedema Neuro: nonfocal, well oriented, appropriate affect Breasts: Deferred   LAB RESULTS: Lab Results  Component Value Date   WBC 10.5 (H) 04/21/2016   NEUTROABS 6.9 (H) 04/21/2016   HGB 10.4 (L) 04/21/2016   HCT 33.0 (L) 04/21/2016   MCV 88.5 04/21/2016   PLT 227 04/21/2016      Chemistry      Component Value Date/Time   NA 143 04/21/2016 1250   K 3.8 04/21/2016 1250   CL 107 02/21/2016 0410   CL 105 01/23/2013 1337   CO2 26 04/21/2016  1250   BUN 9.7 04/21/2016 1250   CREATININE 0.7 04/21/2016 1250  Component Value Date/Time   CALCIUM 9.2 04/21/2016 1250   ALKPHOS 93 04/21/2016 1250   AST 16 04/21/2016 1250   ALT 10 04/21/2016 1250   BILITOT 0.38 04/21/2016 1250       Lab Results  Component Value Date   LABCA2 24 11/19/2011    STUDIES: No results found.   ASSESSMENT: 75 y.o.  [place] woman with stage IV breast cancer as of May 2014  (1)  status post right lower outer quadrant lumpectomy and axillary lymph node dissection 11/17/2010 for a pT2 pN1, stage IIB invasive ductal carcinoma, grade 2, estrogen and progesterone receptor positive, HER-2 negative,   (2) Oncotype DX recurrence score of 27 predicting a risk of distant recurrence of 18% with 5 years of tamoxifen (intermediate score);   (3)  status post radiation completed July of 2012,   (4) started anastrozole July 2012, discontinued June 2014 with evidence of metastatic spread to bone  (5) Chronic lymphedema in the right upper extremity.  (6) pathologic fracture of the left humerus along apparent lytic lesion, with no other bone lesions per bone scan 12/12/2012  (7) on 12/27/2012 underwent #1 intramedullary nail left pathologic proximal humerus fracture  #2 left shoulder rotator cuff repair  #3 left shoulder open bone biopsy with pathology showing metastatic breast adenocarcinoma, estrogen receptor 100% positive, progesterone receptor and HER-2 negative, with an MIB-1 of 26%. PET scan 01/13/2013 showed no other areas of disease   (8) status post 35 Gy to the left proximal humerus completed 03/13/2013(  (9) fulvestrant, started on 01/23/2013, stopped May 2017 with progression  (10) zolendronic acid given monthly, first dose 01/23/2013; changed to denosumab as of August 2015 because of access problems, being given every three months  (a) May 2017 dose help after several extractions  (11) multiple liver lesions noted on scans April  2015   (12) letrozole added May 2015, stopped 04/18/14 because of progression in her liver noted on a chest CT performed on 03/14/14  (13) everolimus and exemestane started 04/19/14, stopped May 2017  (a) MRI of the abdomen 07/12/15 showed stable disease  (b) repeat abdominal MRI 12/04/2015 showed progression  (14) started CMF chemotherapy (cyclophosphamide, methotrexate, fluorouracil) 12/24/2015  (a) admitted 02/18/2016 with febrile neutropenia, negative cultures  (b) axillofacial CT scan 02/19/2016 shows no soft-tissue abscessbut lucencies in the roots of multiple upper and lower teeth may indicate periapical abscesses  (c) MRI of the liver 02/26/2016 shows disease progression: CMF discontinued  (15) started eribulin 03/31/2016, to be repeated days 1 and 8 of each cycle  PLAN: Sanda is tolerating the eribulin relatively well, with hair loss so far as her major complication. We are proceeding with cycle 2 today. She will receive on Pro together with her day 8 treatment, as that has significantly supported her white count and enable her to begin the second cycle without delays.  She will then see me again at the beginning of cycle 3. Before proceeding to a fourth cycle she will be completely restaged.  She knows to call for any problems that may develop before her next visit here. Chauncey Cruel, MD  04/21/16

## 2016-04-21 NOTE — Patient Instructions (Signed)
[facility] Discharge Instructions for Patients Receiving Chemotherapy  Today you received the following chemotherapy agents [place]  To help prevent nausea and vomiting after your treatment, we encourage you to take your nausea medication as prescribed by MD.   If you develop nausea and vomiting that is not controlled by your nausea medication, call the clinic.   BELOW ARE SYMPTOMS THAT SHOULD BE REPORTED IMMEDIATELY:  *FEVER GREATER THAN 100.5 F  *CHILLS WITH OR WITHOUT FEVER  NAUSEA AND VOMITING THAT IS NOT CONTROLLED WITH YOUR NAUSEA MEDICATION  *UNUSUAL SHORTNESS OF BREATH  *UNUSUAL BRUISING OR BLEEDING  TENDERNESS IN MOUTH AND THROAT WITH OR WITHOUT PRESENCE OF ULCERS  *URINARY PROBLEMS  *BOWEL PROBLEMS  UNUSUAL RASH Items with * indicate a potential emergency and should be followed up as soon as possible.  Feel free to call the clinic you have any questions or concerns. The clinic phone number is [phone].  Please show the [facility] at check-in to the Emergency Department and triage nurse.

## 2016-04-21 NOTE — Patient Instructions (Signed)

## 2016-04-27 ENCOUNTER — Other Ambulatory Visit: Payer: Self-pay | Admitting: *Deleted

## 2016-04-27 DIAGNOSIS — C50512 Malignant neoplasm of lower-outer quadrant of left female breast: Secondary | ICD-10-CM

## 2016-04-28 ENCOUNTER — Encounter: Payer: Self-pay | Admitting: *Deleted

## 2016-04-28 ENCOUNTER — Other Ambulatory Visit (HOSPITAL_BASED_OUTPATIENT_CLINIC_OR_DEPARTMENT_OTHER): Payer: Medicare Other

## 2016-04-28 ENCOUNTER — Ambulatory Visit (HOSPITAL_BASED_OUTPATIENT_CLINIC_OR_DEPARTMENT_OTHER): Payer: Medicare Other

## 2016-04-28 ENCOUNTER — Ambulatory Visit: Payer: Medicare Other

## 2016-04-28 VITALS — BP 118/93 | HR 78 | Temp 98.5°F | Resp 16

## 2016-04-28 DIAGNOSIS — Z5189 Encounter for other specified aftercare: Secondary | ICD-10-CM | POA: Diagnosis not present

## 2016-04-28 DIAGNOSIS — C7951 Secondary malignant neoplasm of bone: Secondary | ICD-10-CM | POA: Diagnosis not present

## 2016-04-28 DIAGNOSIS — C50512 Malignant neoplasm of lower-outer quadrant of left female breast: Secondary | ICD-10-CM

## 2016-04-28 DIAGNOSIS — Z95828 Presence of other vascular implants and grafts: Secondary | ICD-10-CM

## 2016-04-28 DIAGNOSIS — C787 Secondary malignant neoplasm of liver and intrahepatic bile duct: Secondary | ICD-10-CM

## 2016-04-28 LAB — COMPREHENSIVE METABOLIC PANEL
ALBUMIN: 2.9 g/dL — AB (ref 3.5–5.0)
ALT: 9 U/L (ref 0–55)
ANION GAP: 9 meq/L (ref 3–11)
AST: 21 U/L (ref 5–34)
Alkaline Phosphatase: 85 U/L (ref 40–150)
BILIRUBIN TOTAL: 0.55 mg/dL (ref 0.20–1.20)
BUN: 7.5 mg/dL (ref 7.0–26.0)
CALCIUM: 9.4 mg/dL (ref 8.4–10.4)
CHLORIDE: 107 meq/L (ref 98–109)
CO2: 26 mEq/L (ref 22–29)
CREATININE: 0.7 mg/dL (ref 0.6–1.1)
EGFR: >90 mL/min/{1.73_m2} (ref >90)
Glucose: 88 mg/dl (ref 70–140)
Potassium: 3.6 mEq/L (ref 3.5–5.1)
Sodium: 142 mEq/L (ref 136–145)
TOTAL PROTEIN: 6.7 g/dL (ref 6.4–8.3)

## 2016-04-28 LAB — CBC WITH DIFFERENTIAL/PLATELET
BASO%: 3.5 % — AB (ref 0.0–2.0)
Basophils Absolute: 0.1 10*3/uL (ref 0.0–0.1)
EOS%: 0.1 % (ref 0.0–7.0)
Eosinophils Absolute: 0 10*3/uL (ref 0.0–0.5)
HEMATOCRIT: 31 % — AB (ref 34.8–46.6)
HGB: 10.1 g/dL — ABNORMAL LOW (ref 11.6–15.9)
LYMPH#: 1.7 10*3/uL (ref 0.9–3.3)
LYMPH%: 54.3 % — ABNORMAL HIGH (ref 14.0–49.7)
MCH: 28.2 pg (ref 25.1–34.0)
MCHC: 32.5 g/dL (ref 31.5–36.0)
MCV: 86.8 fL (ref 79.5–101.0)
MONO#: 0.2 10*3/uL (ref 0.1–0.9)
MONO%: 7.8 % (ref 0.0–14.0)
NEUT%: 34.3 % — AB (ref 38.4–76.8)
NEUTROS ABS: 1.1 10*3/uL — AB (ref 1.5–6.5)
PLATELETS: 227 10*3/uL (ref 145–400)
RBC: 3.58 10*6/uL — ABNORMAL LOW (ref 3.70–5.45)
RDW: 19.3 % — AB (ref 11.2–14.5)
WBC: 3.1 10*3/uL — AB (ref 3.9–10.3)

## 2016-04-28 MED ORDER — SODIUM CHLORIDE 0.9 % IV SOLN
1.4300 mg/m2 | Freq: Once | INTRAVENOUS | Status: AC
  Administered 2016-04-28: 2.5 mg via INTRAVENOUS
  Filled 2016-04-28: qty 5

## 2016-04-28 MED ORDER — SODIUM CHLORIDE 0.9 % IV SOLN
Freq: Once | INTRAVENOUS | Status: AC
  Administered 2016-04-28: 15:00:00 via INTRAVENOUS

## 2016-04-28 MED ORDER — PROCHLORPERAZINE MALEATE 10 MG PO TABS
10.0000 mg | ORAL_TABLET | Freq: Once | ORAL | Status: AC
  Administered 2016-04-28: 10 mg via ORAL

## 2016-04-28 MED ORDER — SODIUM CHLORIDE 0.9 % IJ SOLN
10.0000 mL | INTRAMUSCULAR | Status: DC | PRN
  Administered 2016-04-28: 10 mL via INTRAVENOUS
  Filled 2016-04-28: qty 10

## 2016-04-28 MED ORDER — HEPARIN SOD (PORK) LOCK FLUSH 100 UNIT/ML IV SOLN
500.0000 [IU] | Freq: Once | INTRAVENOUS | Status: AC | PRN
  Administered 2016-04-28: 500 [IU]
  Filled 2016-04-28: qty 5

## 2016-04-28 MED ORDER — SODIUM CHLORIDE 0.9% FLUSH
10.0000 mL | INTRAVENOUS | Status: DC | PRN
Start: 2016-04-28 — End: 2016-04-28
  Administered 2016-04-28: 10 mL
  Filled 2016-04-28: qty 10

## 2016-04-28 MED ORDER — PROCHLORPERAZINE MALEATE 10 MG PO TABS
ORAL_TABLET | ORAL | Status: AC
  Filled 2016-04-28: qty 1

## 2016-04-28 MED ORDER — PEGFILGRASTIM 6 MG/0.6ML ~~LOC~~ PSKT
6.0000 mg | PREFILLED_SYRINGE | Freq: Once | SUBCUTANEOUS | Status: AC
  Administered 2016-04-28: 6 mg via SUBCUTANEOUS
  Filled 2016-04-28: qty 0.6

## 2016-04-28 NOTE — Patient Instructions (Signed)
[facility] Discharge Instructions for Patients Receiving Chemotherapy  Today you received the following chemotherapy agents [place]   To help prevent nausea and vomiting after your treatment, we encourage you to take your nausea medication as directed. If you develop nausea and vomiting that is not controlled by your nausea medication, call the clinic.   BELOW ARE SYMPTOMS THAT SHOULD BE REPORTED IMMEDIATELY:  *FEVER GREATER THAN 100.5 F  *CHILLS WITH OR WITHOUT FEVER  NAUSEA AND VOMITING THAT IS NOT CONTROLLED WITH YOUR NAUSEA MEDICATION  *UNUSUAL SHORTNESS OF BREATH  *UNUSUAL BRUISING OR BLEEDING  TENDERNESS IN MOUTH AND THROAT WITH OR WITHOUT PRESENCE OF ULCERS  *URINARY PROBLEMS  *BOWEL PROBLEMS  UNUSUAL RASH Items with * indicate a potential emergency and should be followed up as soon as possible.  Feel free to call the clinic you have any questions or concerns. The clinic phone number is [phone].  

## 2016-04-28 NOTE — Progress Notes (Signed)
OK to proceed with treatment with ANC 1.1 per Dr. [name].   Pt to get neulasta following treatment.

## 2016-05-11 ENCOUNTER — Other Ambulatory Visit: Payer: Self-pay | Admitting: *Deleted

## 2016-05-11 DIAGNOSIS — C50512 Malignant neoplasm of lower-outer quadrant of left female breast: Secondary | ICD-10-CM

## 2016-05-11 DIAGNOSIS — C7951 Secondary malignant neoplasm of bone: Secondary | ICD-10-CM

## 2016-05-12 ENCOUNTER — Ambulatory Visit: Payer: Medicare Other

## 2016-05-12 ENCOUNTER — Ambulatory Visit (HOSPITAL_BASED_OUTPATIENT_CLINIC_OR_DEPARTMENT_OTHER): Payer: Medicare Other | Admitting: Oncology

## 2016-05-12 ENCOUNTER — Ambulatory Visit (HOSPITAL_BASED_OUTPATIENT_CLINIC_OR_DEPARTMENT_OTHER): Payer: Medicare Other

## 2016-05-12 ENCOUNTER — Other Ambulatory Visit (HOSPITAL_BASED_OUTPATIENT_CLINIC_OR_DEPARTMENT_OTHER): Payer: Medicare Other

## 2016-05-12 ENCOUNTER — Other Ambulatory Visit: Payer: Self-pay | Admitting: *Deleted

## 2016-05-12 VITALS — BP 115/47 | HR 69 | Temp 98.0°F | Resp 18 | Ht 60.0 in | Wt 156.4 lb

## 2016-05-12 DIAGNOSIS — C50512 Malignant neoplasm of lower-outer quadrant of left female breast: Secondary | ICD-10-CM

## 2016-05-12 DIAGNOSIS — C787 Secondary malignant neoplasm of liver and intrahepatic bile duct: Secondary | ICD-10-CM

## 2016-05-12 DIAGNOSIS — C7951 Secondary malignant neoplasm of bone: Secondary | ICD-10-CM

## 2016-05-12 DIAGNOSIS — Z95828 Presence of other vascular implants and grafts: Secondary | ICD-10-CM

## 2016-05-12 DIAGNOSIS — Z170 Estrogen receptor positive status [ER+]: Secondary | ICD-10-CM

## 2016-05-12 LAB — COMPREHENSIVE METABOLIC PANEL
ALBUMIN: 2.8 g/dL — AB (ref 3.5–5.0)
ALK PHOS: 79 U/L (ref 40–150)
ALT: <9 U/L (ref 0–55)
AST: 15 U/L (ref 5–34)
Anion Gap: 10 mEq/L (ref 3–11)
BILIRUBIN TOTAL: 0.88 mg/dL (ref 0.20–1.20)
BUN: 11.4 mg/dL (ref 7.0–26.0)
CO2: 28 mEq/L (ref 22–29)
CREATININE: 0.7 mg/dL (ref 0.6–1.1)
Calcium: 9.2 mg/dL (ref 8.4–10.4)
Chloride: 102 mEq/L (ref 98–109)
EGFR: >90 mL/min/{1.73_m2} (ref >90)
GLUCOSE: 89 mg/dL (ref 70–140)
Potassium: 3.2 mEq/L — ABNORMAL LOW (ref 3.5–5.1)
SODIUM: 141 meq/L (ref 136–145)
TOTAL PROTEIN: 6.7 g/dL (ref 6.4–8.3)

## 2016-05-12 LAB — CBC WITH DIFFERENTIAL/PLATELET
BASO%: 0.6 % (ref 0.0–2.0)
Basophils Absolute: 0 10*3/uL (ref 0.0–0.1)
EOS ABS: 0 10*3/uL (ref 0.0–0.5)
EOS%: 0 % (ref 0.0–7.0)
HCT: 32.8 % — ABNORMAL LOW (ref 34.8–46.6)
HEMOGLOBIN: 10.7 g/dL — AB (ref 11.6–15.9)
LYMPH%: 26.4 % (ref 14.0–49.7)
MCH: 28.4 pg (ref 25.1–34.0)
MCHC: 32.6 g/dL (ref 31.5–36.0)
MCV: 86.9 fL (ref 79.5–101.0)
MONO#: 1 10*3/uL — ABNORMAL HIGH (ref 0.1–0.9)
MONO%: 18.1 % — AB (ref 0.0–14.0)
NEUT%: 54.9 % (ref 38.4–76.8)
NEUTROS ABS: 3 10*3/uL (ref 1.5–6.5)
Platelets: 281 10*3/uL (ref 145–400)
RBC: 3.77 10*6/uL (ref 3.70–5.45)
RDW: 19.3 % — AB (ref 11.2–14.5)
WBC: 5.4 10*3/uL (ref 3.9–10.3)
lymph#: 1.4 10*3/uL (ref 0.9–3.3)

## 2016-05-12 MED ORDER — SODIUM CHLORIDE 0.9 % IJ SOLN
10.0000 mL | INTRAMUSCULAR | Status: DC | PRN
  Administered 2016-05-12: 10 mL via INTRAVENOUS
  Filled 2016-05-12: qty 10

## 2016-05-12 NOTE — Progress Notes (Signed)
ID: Christy Harrell   DOB: [birth year]  MR#: [id number]  POE#:[id number]  PCP: [name], MD GYN:  SU:  OTHER MD:  Alysia Penna, Frederik Pear, Mellody Life DDS  CC: Breast cancer stage IV  TREATMENT: eribulin, [denosumab]  BREAST CANCER HISTORY: From the original intake note:  The patient had routine screening mammography 10/16/2010 showing a lobulated mass in the right subareolar region measuring up to 5.5 cm. The Left breast showed some central microcalcifications. Biopsy of the Right breast mass on 10/28/2010 showed an invasive ductal carcinoma, grade 2, estrogen receptor positive (Allred score 8), progesterone receptor positive (Allred score 5), with an equivocal HER-2. The Left breast area of microcalcifications was biopsied at the same time, and was read as suspicious for DCIS.  On 11/17/2010 the patient underwent Right lumpectomy and axillary lymph node dissection for what proved to be a 3 cm invasive ductal carcinoma, grade 2, with some papillary and mucinous features. One of 16 lymph nodes was involved. FISH showed no HER-2 amplification. Left breast biopsy was benign.  The patient had an Oncotype sent, with a score of 27, predicting a risk of distant recurrence after 5 years of tamoxifen in the 18% range. With this intermediate result, the decision was made not to proceed with chemotherapy, since the patient is the sole caregiver to her very elderly mother. Instead the patient proceeded to radiation treatment which was completed 03/06/2011. She started anastrozole at that point. Her subsequent history is as detailed below   INTERVAL HISTORY: Christy Harrell returns today for follow up of her stage IV breast cancer. This is day 1 cycle 3 of eribulin which she receives day 1 and 8 of each 21 day cycle.  REVIEW OF SYSTEMS: Arian is "not feeling well". Her eyes or runny, her nose is runny, and she feels cold. She spent all  weekend in bed. She has had no fevers but she does have a cough productive of clear phlegm. She does not think she has more shortness of breath than before. She continues to have some back pain but this is not worse than before. She tells me her sugars are well controlled. A detailed review of systems today was otherwise stable.  PAST MEDICAL HISTORY: Past Medical History:  Diagnosis Date  . Arthritis   . Cancer ([place])    right breast  . Club foot   . Diabetes mellitus    metformin  . GERD (gastroesophageal reflux disease)    watches diet  . History of radiation therapy 02/22/13- 03/13/13   left proximal humerus 3500 cGy 14 sessions  . Hyperlipidemia   . Hypertension   . Lymphedema    right arm  . 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 Metastasis from malignant tumor of breast ([place])    liver  . Neuromuscular disorder ([facility])    lt arm numb sometimes  . Osteoporosis   . Thyroid disease   . Wears glasses     PAST SURGICAL HISTORY: Past Surgical History:  Procedure Laterality Date  . BREAST SURGERY  2012   rt lump-16 nodes-in [place]  . CESAREAN SECTION    . CESAREAN SECTION    . COLONOSCOPY    . DILATION AND CURETTAGE OF UTERUS    . FOOT SURGERY     as a child for club foot  . HUMERUS IM NAIL Left 12/27/2012   Procedure: INTRAMEDULLARY (IM) NAIL HUMERAL LEFT PATHOLOGIC FRACTURE;  Surgeon: Nita Sells, MD;  Location: [facility];  Service: Orthopedics;  Laterality: Left;  . THYROIDECTOMY  2002    FAMILY HISTORY Family History  Problem Relation Age of Onset  . Cancer Neg Hx   . Arthritis Neg Hx   . Heart disease Neg Hx   . Hyperlipidemia Neg Hx   . Hypertension Neg Hx   . Dementia Mother   . Hearing loss Mother    The patient's father died from unknown causes. The patient's mother is alive at age 62. The patient was a single child. There is no history of breast or ovarian cancer in the family to her  knowledge.  GYNECOLOGIC HISTORY: Menarche age 44, menopause in her early 19s. The patient is GX P1, first pregnancy to term age 9. She did not take hormone replacement  SOCIAL HISTORY: The patient grew up in [place], attended [employer], then moved to [place] where she lived about 40 years. 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 Her mother, [name] away at 100, on August 22, 2014. The patient's daughter [name], 77, is disabled secondary to pulmonary hypertension. She can be reached at [phone]. The patient has 2 grandchildren, [name] who works at a Scientist, physiological business and is 75 years old, in general, 77, completing high school.   ADVANCED DIRECTIVES:not in place  HEALTH MAINTENANCE: Social History  Substance Use Topics  . Smoking status: Former Smoker    Quit date: 12/23/1974  . Smokeless tobacco: Never Used  . Alcohol use No     Colonoscopy:  PAP:    Bone density:  2012, on ibandronate chronically  Lipid panel: UTD  Allergies  Allergen Reactions  . Bydureon [Exenatide] Rash  . Gadolinium Derivatives Other (See Comments)    Pt began having chest numbness/tingling , stated her heart felt funny, had to take her to ED for EKG per RN   CAP  . Enalapril Rash    Current Outpatient Prescriptions  Medication Sig Dispense Refill  . amoxicillin-clavulanate (AUGMENTIN) 875-125 MG tablet Take 1 tablet by mouth 2 (two) times daily. 8 tablet 0  . aspirin EC 81 MG tablet Take 1 tablet (81 mg total) by mouth daily.    Marland Kitchen atorvastatin (LIPITOR) 80 MG tablet Take 1 tablet (80 mg total) by mouth daily. 30 tablet 11  . docusate sodium (COLACE) 100 MG capsule Take 1 capsule (100 mg total) by mouth 2 (two) times daily as needed. Reported on 10/17/2015 (Patient taking differently: Take 100 mg by mouth 2 (two) times daily as needed for mild constipation. Reported on 10/17/2015) 10 capsule 0  . doxepin (SINEQUAN) 25 MG capsule Take 2 capsules  (50 mg total) by mouth at bedtime as needed. (Patient taking differently: Take 50 mg by mouth at bedtime as needed (sleep). ) 180 capsule 1  . fentaNYL (DURAGESIC - DOSED MCG/HR) 25 MCG/HR patch Place 1 patch (25 mcg total) onto the skin every 3 (three) days. 5 patch 0  . furosemide (LASIX) 40 MG tablet Take 1 tablet (40 mg total) by mouth daily. 90 tablet 2  . levofloxacin (LEVAQUIN) 500 MG tablet Take 1 tablet (500 mg total) by mouth daily. 4 tablet 0  . levothyroxine (SYNTHROID) 175 MCG tablet Take 1 tablet (175 mcg total) by mouth daily before breakfast. 90 tablet 1  . lubiprostone (AMITIZA) 24 MCG capsule Take 1 capsule (24 mcg total) by mouth 2 (two) times daily with a meal. (Patient taking differently:  Take 24 mcg by mouth 2 (two) times daily as needed for constipation. ) 180 capsule 1  . magic mouthwash SOLN Take 5 mLs by mouth 4 (four) times daily as needed for mouth pain. 240 mL 3  . metFORMIN (GLUCOPHAGE) 500 MG tablet Take 1 tablet (500 mg total) by mouth daily. 90 tablet 1  . metoCLOPramide (REGLAN) 5 MG tablet Take 1 tablet (5 mg total) by mouth every 8 (eight) hours as needed for nausea. (Patient not taking: Reported on 02/18/2016) 90 tablet 12  . ondansetron (ZOFRAN) 8 MG tablet Take twice a day starting the evenng of the day of chemo and continuing 2 full days (Patient not taking: Reported on 02/18/2016) 40 tablet 0  . oxyCODONE (OXY IR/ROXICODONE) 5 MG immediate release tablet Take 1-2 tablets (5-10 mg total) by mouth every 6 (six) hours as needed for severe pain. 100 tablet 0  . polyethylene glycol powder (GLYCOLAX/MIRALAX) powder Take 255 g (1 Container total) by mouth daily. (Patient taking differently: Take 1 Container by mouth daily as needed for mild constipation or moderate constipation. ) 255 g 3  . potassium chloride SA (K-DUR,KLOR-CON) 20 MEQ tablet Take 1 tablet (20 mEq total) by mouth 2 (two) times daily. 180 tablet 1  . pregabalin (LYRICA) 75 MG capsule Take 1 capsule (75 mg  total) by mouth 2 (two) times daily. Not taking 60 capsule 5  . prochlorperazine (COMPAZINE) 10 MG tablet Take one tablet three times a day before meals tarting the evening of chemo and continuing for an additional two days (Patient not taking: Reported on 02/18/2016) 30 tablet 0  . prochlorperazine (COMPAZINE) 10 MG tablet Take 1 tablet (10 mg total) by mouth every 6 (six) hours as needed (Nausea or vomiting). 30 tablet 1   No current facility-administered medications for this visit.    PHYSICAL EXAM: Elderly African American woman  Vitals:   05/12/16 1133  BP: (!) 115/47  Pulse: 69  Resp: 18  Temp: 98 F (36.7 C)   Body mass index is 30.54 kg/m.    ECOG FS: 1 Filed Weights   05/12/16 1133  Weight: 156 lb 6.4 oz (70.9 kg)   Sclerae unicteric, pupils round and equal Oropharynx clear and moist-- no thrush or other lesions No cervical or supraclavicular adenopathy Lungs no rales or rhonchi Heart regular rate and rhythm Abd soft, obese, nontender, positive bowel sounds MSK no focal spinal tenderness, no upper extremity lymphedema Neuro: nonfocal, well oriented, appropriate affect Breasts: Deferred     LAB RESULTS: Lab Results  Component Value Date   WBC 5.4 05/12/2016   NEUTROABS 3.0 05/12/2016   HGB 10.7 (L) 05/12/2016   HCT 32.8 (L) 05/12/2016   MCV 86.9 05/12/2016   PLT 281 05/12/2016      Chemistry      Component Value Date/Time   NA 141 05/12/2016 1051   K 3.2 (L) 05/12/2016 1051   CL 107 02/21/2016 0410   CL 105 01/23/2013 1337   CO2 28 05/12/2016 1051   BUN 11.4 05/12/2016 1051   CREATININE 0.7 05/12/2016 1051      Component Value Date/Time   CALCIUM 9.2 05/12/2016 1051   ALKPHOS 79 05/12/2016 1051   AST 15 05/12/2016 1051   ALT <9 05/12/2016 1051   BILITOT 0.88 05/12/2016 1051       Lab Results  Component Value Date   LABCA2 24 11/19/2011    STUDIES: No results found.   ASSESSMENT: 75 y.o.  [place] woman with stage  IV breast cancer as  of May 2014  (1)  status post right lower outer quadrant lumpectomy and axillary lymph node dissection 11/17/2010 for a pT2 pN1, stage IIB invasive ductal carcinoma, grade 2, estrogen and progesterone receptor positive, HER-2 negative,   (2) Oncotype DX recurrence score of 27 predicting a risk of distant recurrence of 18% with 5 years of tamoxifen (intermediate score);   (3)  status post radiation completed July of 2012,   (4) started anastrozole July 2012, discontinued June 2014 with evidence of metastatic spread to bone  (5) Chronic lymphedema in the right upper extremity.  (6) pathologic fracture of the left humerus along apparent lytic lesion, with no other bone lesions per bone scan 12/12/2012  (7) on 12/27/2012 underwent #1 intramedullary nail left pathologic proximal humerus fracture  #2 left shoulder rotator cuff repair  #3 left shoulder open bone biopsy with pathology showing metastatic breast adenocarcinoma, estrogen receptor 100% positive, progesterone receptor and HER-2 negative, with an MIB-1 of 26%. PET scan 01/13/2013 showed no other areas of disease   (8) status post 35 Gy to the left proximal humerus completed 03/13/2013(  (9) fulvestrant, started on 01/23/2013, stopped May 2017 with progression  (10) zolendronic acid given monthly, first dose 01/23/2013; changed to denosumab as of August 2015 because of access problems, being given every three months  (a) May 2017 dose help after several extractions  (11) multiple liver lesions noted on scans April 2015   (12) letrozole added May 2015, stopped 04/18/14 because of progression in her liver noted on a chest CT performed on 03/14/14  (13) everolimus and exemestane started 04/19/14, stopped May 2017  (a) MRI of the abdomen 07/12/15 showed stable disease  (b) repeat abdominal MRI 12/04/2015 showed progression  (14) started CMF chemotherapy (cyclophosphamide, methotrexate, fluorouracil) 12/24/2015  (a) admitted 02/18/2016  with febrile neutropenia, negative cultures  (b) axillofacial CT scan 02/19/2016 shows no soft-tissue abscessbut lucencies in the roots of multiple upper and lower teeth may indicate periapical abscesses  (c) MRI of the liver 02/26/2016 shows disease progression: CMF discontinued  (15) started eribulin 03/31/2016, to be repeated days 1 and 8 of each cycle  (a) cycle 3 delayed one week because of malaise  PLAN: I am not sure what is making Nancey  Feel "bad" today. It may be a mild viral illness. In any case it is significantly affecting her functional status and a think it would be safer simply to postpone her chemotherapy 1 week. She is agreeable to this.  Accordingly she will not be treated today. We will start cycle 3 next week. I have added the day 8 treatment and then she will have a restaging liver MRI on October 24. She will see me a couple of days later to discuss results.  She has a good understanding of this plan. She knows to call for any problems that may develop before her next visit here.   Chauncey Cruel, MD  05/12/16

## 2016-05-12 NOTE — Telephone Encounter (Signed)
Pt requested for refill on magic mouthwash - called [facility] and informed pt has refills available. Refill will be made ready for pt.

## 2016-05-13 ENCOUNTER — Other Ambulatory Visit: Payer: Medicare Other

## 2016-05-13 ENCOUNTER — Encounter: Payer: Self-pay | Admitting: Internal Medicine

## 2016-05-13 ENCOUNTER — Ambulatory Visit (INDEPENDENT_AMBULATORY_CARE_PROVIDER_SITE_OTHER)
Admission: RE | Admit: 2016-05-13 | Discharge: 2016-05-13 | Disposition: A | Discharge status: Home / Self Care | Payer: Medicare Other | Source: Ambulatory Visit | Attending: Internal Medicine | Admitting: Internal Medicine

## 2016-05-13 ENCOUNTER — Ambulatory Visit (INDEPENDENT_AMBULATORY_CARE_PROVIDER_SITE_OTHER): Payer: Medicare Other | Admitting: Internal Medicine

## 2016-05-13 VITALS — BP 102/56 | HR 63 | Temp 98.5°F | Ht 60.0 in | Wt 156.0 lb

## 2016-05-13 DIAGNOSIS — E1140 Type 2 diabetes mellitus with diabetic neuropathy, unspecified: Secondary | ICD-10-CM | POA: Diagnosis not present

## 2016-05-13 DIAGNOSIS — R05 Cough: Secondary | ICD-10-CM

## 2016-05-13 DIAGNOSIS — R059 Cough, unspecified: Secondary | ICD-10-CM

## 2016-05-13 DIAGNOSIS — J988 Other specified respiratory disorders: Secondary | ICD-10-CM

## 2016-05-13 DIAGNOSIS — M4692 Unspecified inflammatory spondylopathy, cervical region: Secondary | ICD-10-CM

## 2016-05-13 DIAGNOSIS — G893 Neoplasm related pain (acute) (chronic): Secondary | ICD-10-CM | POA: Diagnosis not present

## 2016-05-13 DIAGNOSIS — R51 Headache: Secondary | ICD-10-CM

## 2016-05-13 DIAGNOSIS — M5412 Radiculopathy, cervical region: Secondary | ICD-10-CM

## 2016-05-13 DIAGNOSIS — M4802 Spinal stenosis, cervical region: Secondary | ICD-10-CM

## 2016-05-13 DIAGNOSIS — M4722 Other spondylosis with radiculopathy, cervical region: Secondary | ICD-10-CM

## 2016-05-13 DIAGNOSIS — IMO0002 Reserved for concepts with insufficient information to code with codable children: Secondary | ICD-10-CM

## 2016-05-13 DIAGNOSIS — Z23 Encounter for immunization: Secondary | ICD-10-CM | POA: Diagnosis not present

## 2016-05-13 DIAGNOSIS — M4682 Other specified inflammatory spondylopathies, cervical region: Secondary | ICD-10-CM

## 2016-05-13 DIAGNOSIS — R519 Headache, unspecified: Secondary | ICD-10-CM

## 2016-05-13 DIAGNOSIS — E1165 Type 2 diabetes mellitus with hyperglycemia: Secondary | ICD-10-CM

## 2016-05-13 DIAGNOSIS — M159 Polyosteoarthritis, unspecified: Secondary | ICD-10-CM

## 2016-05-13 MED ORDER — PROMETHAZINE-DM 6.25-15 MG/5ML PO SYRP: 5.0000 mL | ORAL_SOLUTION | Freq: Four times a day (QID) | ORAL | 0 refills | Status: DC | PRN

## 2016-05-13 MED ORDER — FENTANYL 25 MCG/HR TD PT72: 25.0000 ug | MEDICATED_PATCH | TRANSDERMAL | 0 refills | Status: DC

## 2016-05-13 MED ORDER — CEFDINIR 300 MG PO CAPS: 300.0000 mg | ORAL_CAPSULE | Freq: Two times a day (BID) | ORAL | 1 refills | Status: AC

## 2016-05-13 MED ORDER — OXYCODONE HCL 5 MG PO TABS: 5.0000 mg | ORAL_TABLET | Freq: Four times a day (QID) | ORAL | 0 refills | Status: DC | PRN

## 2016-05-13 MED ORDER — METFORMIN HCL 500 MG PO TABS: 500.0000 mg | ORAL_TABLET | Freq: Every day | ORAL | 0 refills | Status: AC

## 2016-05-13 NOTE — Progress Notes (Signed)
Subjective:  Patient ID: Christy Harrell, female    DOB: 07/22/1941  Age: 75 y.o. MRN: [id number]  CC: Cough   HPI Christy Harrell presents for a 3 week history of cough that is productive of white phlegm with shortness of breath, chills, sore throat, and runny nose, but no fever, night sweats, hemoptysis, or palpitations.  She also complains of pain all over in her joints and low back and needs a refill on her pain meds.  Outpatient Medications Prior to Visit  Medication Sig Dispense Refill  . aspirin EC 81 MG tablet Take 1 tablet (81 mg total) by mouth daily.    Marland Kitchen atorvastatin (LIPITOR) 80 MG tablet Take 1 tablet (80 mg total) by mouth daily. 30 tablet 11  . docusate sodium (COLACE) 100 MG capsule Take 1 capsule (100 mg total) by mouth 2 (two) times daily as needed. Reported on 10/17/2015 (Patient taking differently: Take 100 mg by mouth 2 (two) times daily as needed for mild constipation. Reported on 10/17/2015) 10 capsule 0  . doxepin (SINEQUAN) 25 MG capsule Take 2 capsules (50 mg total) by mouth at bedtime as needed. (Patient taking differently: Take 50 mg by mouth at bedtime as needed (sleep). ) 180 capsule 1  . furosemide (LASIX) 40 MG tablet Take 1 tablet (40 mg total) by mouth daily. 90 tablet 2  . levothyroxine (SYNTHROID) 175 MCG tablet Take 1 tablet (175 mcg total) by mouth daily before breakfast. 90 tablet 1  . lubiprostone (AMITIZA) 24 MCG capsule Take 1 capsule (24 mcg total) by mouth 2 (two) times daily with a meal. (Patient taking differently: Take 24 mcg by mouth 2 (two) times daily as needed for constipation. ) 180 capsule 1  . magic mouthwash SOLN Take 5 mLs by mouth 4 (four) times daily as needed for mouth pain. 240 mL 3  . polyethylene glycol powder (GLYCOLAX/MIRALAX) powder Take 255 g (1 Container total) by mouth daily. (Patient taking differently: Take 1 Container by mouth daily as needed for mild constipation or moderate constipation. ) 255 g 3  . potassium chloride  SA (K-DUR,KLOR-CON) 20 MEQ tablet Take 1 tablet (20 mEq total) by mouth 2 (two) times daily. 180 tablet 1  . pregabalin (LYRICA) 75 MG capsule Take 1 capsule (75 mg total) by mouth 2 (two) times daily. Not taking 60 capsule 5  . fentaNYL (DURAGESIC - DOSED MCG/HR) 25 MCG/HR patch Place 1 patch (25 mcg total) onto the skin every 3 (three) days. 5 patch 0  . metFORMIN (GLUCOPHAGE) 500 MG tablet Take 1 tablet (500 mg total) by mouth daily. 90 tablet 1  . metoCLOPramide (REGLAN) 5 MG tablet Take 1 tablet (5 mg total) by mouth every 8 (eight) hours as needed for nausea. 90 tablet 12  . ondansetron (ZOFRAN) 8 MG tablet Take twice a day starting the evenng of the day of chemo and continuing 2 full days 40 tablet 0  . oxyCODONE (OXY IR/ROXICODONE) 5 MG immediate release tablet Take 1-2 tablets (5-10 mg total) by mouth every 6 (six) hours as needed for severe pain. 100 tablet 0  . amoxicillin-clavulanate (AUGMENTIN) 875-125 MG tablet Take 1 tablet by mouth 2 (two) times daily. 8 tablet 0  . levofloxacin (LEVAQUIN) 500 MG tablet Take 1 tablet (500 mg total) by mouth daily. 4 tablet 0  . prochlorperazine (COMPAZINE) 10 MG tablet Take one tablet three times a day before meals tarting the evening of chemo and continuing for an additional two days (Patient not taking:  Reported on 05/13/2016) 30 tablet 0  . prochlorperazine (COMPAZINE) 10 MG tablet Take 1 tablet (10 mg total) by mouth every 6 (six) hours as needed (Nausea or vomiting). 30 tablet 1   No facility-administered medications prior to visit.     ROS Review of Systems  Constitutional: Positive for chills. Negative for activity change, appetite change, diaphoresis, fatigue, fever and unexpected weight change.  HENT: Positive for congestion, postnasal drip and sore throat. Negative for facial swelling, sinus pressure, trouble swallowing and voice change.   Eyes: Negative.  Negative for visual disturbance.  Respiratory: Positive for cough and shortness of  breath. Negative for apnea, choking, chest tightness, wheezing and stridor.   Cardiovascular: Negative.  Negative for chest pain, palpitations and leg swelling.  Gastrointestinal: Negative.  Negative for abdominal pain, constipation, diarrhea, nausea and vomiting.  Endocrine: Negative.   Genitourinary: Negative.  Negative for difficulty urinating.  Musculoskeletal: Positive for arthralgias, back pain and neck pain. Negative for gait problem, joint swelling, myalgias and neck stiffness.  Skin: Negative.  Negative for color change and rash.  Allergic/Immunologic: Negative.   Neurological: Negative.  Negative for dizziness, weakness and light-headedness.  Hematological: Negative.  Negative for adenopathy. Does not bruise/bleed easily.  Psychiatric/Behavioral: Negative.     Objective:  BP (!) 102/56 (BP Location: Left Arm, Patient Position: Sitting, Cuff Size: Normal)   Pulse 63   Temp 98.5 F (36.9 C) (Oral)   Ht 5' (1.524 m)   Wt 156 lb (70.8 kg)   SpO2 98%   BMI 30.47 kg/m   BP Readings from Last 3 Encounters:  05/13/16 (!) 102/56  05/12/16 (!) 115/47  04/28/16 (!) 118/93    Wt Readings from Last 3 Encounters:  05/13/16 156 lb (70.8 kg)  05/12/16 156 lb 6.4 oz (70.9 kg)  04/21/16 161 lb 8 oz (73.3 kg)    Physical Exam  Constitutional: She is oriented to person, place, and time.  Non-toxic appearance. She does not have a sickly appearance. She does not appear ill. No distress.  HENT:  Mouth/Throat: Oropharynx is clear and moist. No oropharyngeal exudate.  Eyes: Conjunctivae are normal. Right eye exhibits no discharge. Left eye exhibits no discharge. No scleral icterus.  Neck: Normal range of motion. Neck supple. No JVD present. No tracheal deviation present. No thyromegaly present.  Cardiovascular: Normal rate, regular rhythm, normal heart sounds and intact distal pulses.  Exam reveals no gallop and no friction rub.   No murmur heard. Pulmonary/Chest: Effort normal and  breath sounds normal. No stridor. No respiratory distress. She has no wheezes. She has no rales. She exhibits no tenderness.  Abdominal: Soft. Bowel sounds are normal. She exhibits no distension and no mass. There is no tenderness. There is no rebound and no guarding.  Musculoskeletal: Normal range of motion. She exhibits no edema, tenderness or deformity.  Lymphadenopathy:    She has no cervical adenopathy.  Neurological: She is oriented to person, place, and time.  Skin: Skin is warm and dry. No rash noted. She is not diaphoretic. No erythema. No pallor.  Psychiatric: She has a normal mood and affect. Her behavior is normal. Judgment and thought content normal.  Vitals reviewed.   Lab Results  Component Value Date   WBC 5.4 05/12/2016   HGB 10.7 (L) 05/12/2016   HCT 32.8 (L) 05/12/2016   PLT 281 05/12/2016   GLUCOSE 89 05/12/2016   CHOL 272 (H) 02/20/2015   TRIG 95.0 02/20/2015   HDL 58.70 02/20/2015   LDLDIRECT 160.0  03/16/2013   LDLCALC 194 (H) 02/20/2015   ALT <9 05/12/2016   AST 15 05/12/2016   NA 141 05/12/2016   K 3.2 (L) 05/12/2016   CL 107 02/21/2016   CREATININE 0.7 05/12/2016   BUN 11.4 05/12/2016   CO2 28 05/12/2016   TSH 2.91 12/03/2015   INR 0.96 12/17/2015   HGBA1C 6.4 12/03/2015   MICROALBUR 7.3 (H) 10/19/2014    Mr Liver Wo Contrast  Result Date: 02/26/2016 CLINICAL DATA:  Follow up of metastatic breast cancer. Liver metastatic disease. EXAM: MRI ABDOMEN WITHOUT CONTRAST TECHNIQUE: Multiplanar multisequence MR imaging was performed without the administration of intravenous contrast. COMPARISON:  12/04/2015 FINDINGS: The patient refused IV contrast. Lower chest: Edema in the right breast on right upper arm. Cystic lesion medially in the right breast about 2.2 by 1.8 cm on image 6/3. Small right pleural effusion. Hepatobiliary: Scattered hepatic metastatic lesions. Index lesion in the lateral segment left hepatic lobe measures 6.2 by 5.4 cm, formerly 5.4 by 5.2  cm by my measurements. A right hepatic lobe index lesion on image 18/3 measures 5.6 by 4.3 cm, previously 5.0 by 4.0 cm. I do not see any definite new lesions. No biliary dilatation. Gallbladder unremarkable. Pancreas: Unremarkable Spleen: Unremarkable Adrenals/Urinary Tract: Unremarkable Stomach/Bowel: Unremarkable Vascular/Lymphatic: Unremarkable Other: No supplemental non-categorized findings. Musculoskeletal: Small umbilical hernia contains adipose tissue on the coronal images. Lower lumbar degenerative disc disease and spondylosis observed. IMPRESSION: 1. Mild increase in size of the hepatic metastatic lesions, without new lesions identified. 2. Worsened edema in the right inferior breast and visualized right upper extremity subcutaneous tissues. 3. Lower lumbar degenerative disc disease and spondylosis. Electronically Signed   By: Van Clines M.D.   On: 02/26/2016 14:43     Assessment & Plan:   Jakyla was seen today for cough.  Diagnoses and all orders for this visit:  Need for prophylactic vaccination and inoculation against influenza -     Flu vaccine HIGH DOSE PF (Fluzone High dose)  Type 2 diabetes, uncontrolled, with neuropathy ([place])- her blood sugars are adequately well controlled, will continue metformin. -     metFORMIN (GLUCOPHAGE) 500 MG tablet; Take 1 tablet (500 mg total) by mouth daily.  Cancer associated pain -     Discontinue: fentaNYL (DURAGESIC - DOSED MCG/HR) 25 MCG/HR patch; Place 1 patch (25 mcg total) onto the skin every 3 (three) days. -     Discontinue: fentaNYL (DURAGESIC - DOSED MCG/HR) 25 MCG/HR patch; Place 1 patch (25 mcg total) onto the skin every 3 (three) days. -     oxyCODONE (OXY IR/ROXICODONE) 5 MG immediate release tablet; Take 1-2 tablets (5-10 mg total) by mouth every 6 (six) hours as needed for severe pain.  Cervical spondylitis with radiculitis (HCC) -     Discontinue: fentaNYL (DURAGESIC - DOSED MCG/HR) 25 MCG/HR patch; Place 1 patch (25  mcg total) onto the skin every 3 (three) days. -     Discontinue: fentaNYL (DURAGESIC - DOSED MCG/HR) 25 MCG/HR patch; Place 1 patch (25 mcg total) onto the skin every 3 (three) days. -     oxyCODONE (OXY IR/ROXICODONE) 5 MG immediate release tablet; Take 1-2 tablets (5-10 mg total) by mouth every 6 (six) hours as needed for severe pain.  Spinal stenosis in cervical region -     Discontinue: fentaNYL (DURAGESIC - DOSED MCG/HR) 25 MCG/HR patch; Place 1 patch (25 mcg total) onto the skin every 3 (three) days. -     Discontinue: fentaNYL ([place] - DOSED  MCG/HR) 25 MCG/HR patch; Place 1 patch (25 mcg total) onto the skin every 3 (three) days. -     oxyCODONE (OXY IR/ROXICODONE) 5 MG immediate release tablet; Take 1-2 tablets (5-10 mg total) by mouth every 6 (six) hours as needed for severe pain.  Generalized OA -     oxyCODONE (OXY IR/ROXICODONE) 5 MG immediate release tablet; Take 1-2 tablets (5-10 mg total) by mouth every 6 (six) hours as needed for severe pain.  Cancer related pain  Right facial pain  Cough- I will check her chest x-ray for mass, metastases, pneumonia, pulmonary edema. -     promethazine-dextromethorphan (PROMETHAZINE-DM) 6.25-15 MG/5ML syrup; Take 5 mLs by mouth 4 (four) times daily as needed for cough. -     DG Chest 2 View; Future  RTI (respiratory tract infection)- will control her cough with Phenergan DM and will treat the infection with Omnicef -     promethazine-dextromethorphan (PROMETHAZINE-DM) 6.25-15 MG/5ML syrup; Take 5 mLs by mouth 4 (four) times daily as needed for cough. -     cefdinir (OMNICEF) 300 MG capsule; Take 1 capsule (300 mg total) by mouth 2 (two) times daily.   I have discontinued Ms. Milner's metoCLOPramide, ondansetron, prochlorperazine, fentaNYL, levofloxacin, amoxicillin-clavulanate, prochlorperazine, fentaNYL, and fentaNYL. I am also having her start on promethazine-dextromethorphan and cefdinir. Additionally, I am having her maintain her  aspirin EC, polyethylene glycol powder, atorvastatin, doxepin, levothyroxine, furosemide, potassium chloride SA, lubiprostone, docusate sodium, magic mouthwash, pregabalin, metFORMIN, and oxyCODONE.  Meds ordered this encounter  Medications  . metFORMIN (GLUCOPHAGE) 500 MG tablet    Sig: Take 1 tablet (500 mg total) by mouth daily.    Dispense:  90 tablet    Refill:  0  . DISCONTD: fentaNYL (DURAGESIC - DOSED MCG/HR) 25 MCG/HR patch    Sig: Place 1 patch (25 mcg total) onto the skin every 3 (three) days.    Dispense:  10 patch    Refill:  0  . DISCONTD: fentaNYL (DURAGESIC - DOSED MCG/HR) 25 MCG/HR patch    Sig: Place 1 patch (25 mcg total) onto the skin every 3 (three) days.    Dispense:  10 patch    Refill:  0  . oxyCODONE (OXY IR/ROXICODONE) 5 MG immediate release tablet    Sig: Take 1-2 tablets (5-10 mg total) by mouth every 6 (six) hours as needed for severe pain.    Dispense:  100 tablet    Refill:  0  . promethazine-dextromethorphan (PROMETHAZINE-DM) 6.25-15 MG/5ML syrup    Sig: Take 5 mLs by mouth 4 (four) times daily as needed for cough.    Dispense:  118 mL    Refill:  0  . cefdinir (OMNICEF) 300 MG capsule    Sig: Take 1 capsule (300 mg total) by mouth 2 (two) times daily.    Dispense:  20 capsule    Refill:  1     Follow-up: Return in about 3 weeks (around 06/03/2016).  [name], MD

## 2016-05-13 NOTE — Progress Notes (Signed)
Pre visit review using our clinic review tool, if applicable. No additional management support is needed unless otherwise documented below in the visit note. 

## 2016-05-13 NOTE — Patient Instructions (Signed)

## 2016-05-18 ENCOUNTER — Telehealth: Payer: Self-pay | Admitting: Internal Medicine

## 2016-05-18 NOTE — Telephone Encounter (Signed)
Patient is requesting results of x rays and labs.

## 2016-05-19 ENCOUNTER — Ambulatory Visit (HOSPITAL_BASED_OUTPATIENT_CLINIC_OR_DEPARTMENT_OTHER): Payer: Medicare Other

## 2016-05-19 ENCOUNTER — Ambulatory Visit: Payer: Medicare Other

## 2016-05-19 ENCOUNTER — Ambulatory Visit (HOSPITAL_BASED_OUTPATIENT_CLINIC_OR_DEPARTMENT_OTHER): Payer: Medicare Other | Admitting: Oncology

## 2016-05-19 ENCOUNTER — Other Ambulatory Visit: Payer: Medicare Other

## 2016-05-19 ENCOUNTER — Other Ambulatory Visit: Payer: Self-pay | Admitting: *Deleted

## 2016-05-19 VITALS — BP 107/55 | HR 77 | Temp 98.1°F | Resp 18 | Ht 60.0 in | Wt 158.5 lb

## 2016-05-19 DIAGNOSIS — C787 Secondary malignant neoplasm of liver and intrahepatic bile duct: Secondary | ICD-10-CM

## 2016-05-19 DIAGNOSIS — Z95828 Presence of other vascular implants and grafts: Secondary | ICD-10-CM

## 2016-05-19 DIAGNOSIS — Z170 Estrogen receptor positive status [ER+]: Principal | ICD-10-CM

## 2016-05-19 DIAGNOSIS — C7951 Secondary malignant neoplasm of bone: Secondary | ICD-10-CM

## 2016-05-19 DIAGNOSIS — C50512 Malignant neoplasm of lower-outer quadrant of left female breast: Secondary | ICD-10-CM

## 2016-05-19 LAB — COMPREHENSIVE METABOLIC PANEL
ALT: <9 U/L (ref 0–55)
ANION GAP: 10 meq/L (ref 3–11)
AST: 18 U/L (ref 5–34)
Albumin: 2.8 g/dL — ABNORMAL LOW (ref 3.5–5.0)
Alkaline Phosphatase: 78 U/L (ref 40–150)
BUN: 8.4 mg/dL (ref 7.0–26.0)
CHLORIDE: 106 meq/L (ref 98–109)
CO2: 26 meq/L (ref 22–29)
Calcium: 9.2 mg/dL (ref 8.4–10.4)
Creatinine: 0.7 mg/dL (ref 0.6–1.1)
Glucose: 83 mg/dl (ref 70–140)
POTASSIUM: 3.5 meq/L (ref 3.5–5.1)
Sodium: 141 mEq/L (ref 136–145)
Total Bilirubin: 0.45 mg/dL (ref 0.20–1.20)
Total Protein: 6.6 g/dL (ref 6.4–8.3)

## 2016-05-19 LAB — CBC WITH DIFFERENTIAL/PLATELET
BASO%: 3.2 % — ABNORMAL HIGH (ref 0.0–2.0)
BASOS ABS: 0.2 10*3/uL — AB (ref 0.0–0.1)
EOS ABS: 0 10*3/uL (ref 0.0–0.5)
EOS%: 0.4 % (ref 0.0–7.0)
HCT: 31.5 % — ABNORMAL LOW (ref 34.8–46.6)
HGB: 10.4 g/dL — ABNORMAL LOW (ref 11.6–15.9)
LYMPH%: 35 % (ref 14.0–49.7)
MCH: 28.9 pg (ref 25.1–34.0)
MCHC: 33 g/dL (ref 31.5–36.0)
MCV: 87.6 fL (ref 79.5–101.0)
MONO#: 0.7 10*3/uL (ref 0.1–0.9)
MONO%: 14.4 % — AB (ref 0.0–14.0)
NEUT#: 2.3 10*3/uL (ref 1.5–6.5)
NEUT%: 47 % (ref 38.4–76.8)
PLATELETS: 291 10*3/uL (ref 145–400)
RBC: 3.6 10*6/uL — AB (ref 3.70–5.45)
RDW: 19 % — ABNORMAL HIGH (ref 11.2–14.5)
WBC: 4.9 10*3/uL (ref 3.9–10.3)
lymph#: 1.7 10*3/uL (ref 0.9–3.3)

## 2016-05-19 MED ORDER — HEPARIN SOD (PORK) LOCK FLUSH 100 UNIT/ML IV SOLN
500.0000 [IU] | Freq: Once | INTRAVENOUS | Status: AC | PRN
  Administered 2016-05-19: 500 [IU]
  Filled 2016-05-19: qty 5

## 2016-05-19 MED ORDER — PROCHLORPERAZINE MALEATE 10 MG PO TABS
10.0000 mg | ORAL_TABLET | Freq: Once | ORAL | Status: AC
  Administered 2016-05-19: 10 mg via ORAL

## 2016-05-19 MED ORDER — SODIUM CHLORIDE 0.9 % IV SOLN
Freq: Once | INTRAVENOUS | Status: AC
  Administered 2016-05-19: 12:00:00 via INTRAVENOUS

## 2016-05-19 MED ORDER — SODIUM CHLORIDE 0.9 % IJ SOLN
10.0000 mL | INTRAMUSCULAR | Status: DC | PRN
  Administered 2016-05-19 (×2): 10 mL via INTRAVENOUS
  Filled 2016-05-19: qty 10

## 2016-05-19 MED ORDER — PROCHLORPERAZINE MALEATE 10 MG PO TABS
ORAL_TABLET | ORAL | Status: AC
  Filled 2016-05-19: qty 1

## 2016-05-19 MED ORDER — SODIUM CHLORIDE 0.9% FLUSH
10.0000 mL | INTRAVENOUS | Status: DC | PRN
  Filled 2016-05-19: qty 10

## 2016-05-19 MED ORDER — SODIUM CHLORIDE 0.9 % IV SOLN
1.4200 mg/m2 | Freq: Once | INTRAVENOUS | Status: AC
  Administered 2016-05-19: 2.5 mg via INTRAVENOUS
  Filled 2016-05-19: qty 3

## 2016-05-19 NOTE — Patient Instructions (Signed)
[facility] Discharge Instructions for Patients Receiving Chemotherapy  Today you received the following chemotherapy agents [place]   To help prevent nausea and vomiting after your treatment, we encourage you to take your nausea medication as directed. If you develop nausea and vomiting that is not controlled by your nausea medication, call the clinic.   BELOW ARE SYMPTOMS THAT SHOULD BE REPORTED IMMEDIATELY:  *FEVER GREATER THAN 100.5 F  *CHILLS WITH OR WITHOUT FEVER  NAUSEA AND VOMITING THAT IS NOT CONTROLLED WITH YOUR NAUSEA MEDICATION  *UNUSUAL SHORTNESS OF BREATH  *UNUSUAL BRUISING OR BLEEDING  TENDERNESS IN MOUTH AND THROAT WITH OR WITHOUT PRESENCE OF ULCERS  *URINARY PROBLEMS  *BOWEL PROBLEMS  UNUSUAL RASH Items with * indicate a potential emergency and should be followed up as soon as possible.  Feel free to call the clinic you have any questions or concerns. The clinic phone number is [phone].  

## 2016-05-19 NOTE — Progress Notes (Signed)
ID: Christy Harrell   DOB: November 26, 1940  MR#: [id number]  XBM#:[id number]  PCP: [name], MD GYN:  SU:  OTHER MD:  Christy Harrell, Christy Harrell, Christy Harrell DDS  CC: Breast cancer stage IV  TREATMENT: eribulin, [denosumab]  BREAST CANCER HISTORY: From the original intake note:  The patient had routine screening mammography 10/16/2010 showing a lobulated mass in the right subareolar region measuring up to 5.5 cm. The Left breast showed some central microcalcifications. Biopsy of the Right breast mass on 10/28/2010 showed an invasive ductal carcinoma, grade 2, estrogen receptor positive (Allred score 8), progesterone receptor positive (Allred score 5), with an equivocal HER-2. The Left breast area of microcalcifications was biopsied at the same time, and was read as suspicious for DCIS.  On 11/17/2010 the patient underwent Right lumpectomy and axillary lymph node dissection for what proved to be a 3 cm invasive ductal carcinoma, grade 2, with some papillary and mucinous features. One of 16 lymph nodes was involved. FISH showed no HER-2 amplification. Left breast biopsy was benign.  The patient had an Oncotype sent, with a score of 27, predicting a risk of distant recurrence after 5 years of tamoxifen in the 18% range. With this intermediate result, the decision was made not to proceed with chemotherapy, since the patient is the sole caregiver to her very elderly mother. Instead the patient proceeded to radiation treatment which was completed 03/06/2011. She started anastrozole at that point. Her subsequent history is as detailed below   INTERVAL HISTORY: Christy Harrell returns today for follow up of her stage IV breast cancer. This is day 1 cycle 3 of eribulin which she receives day 1 and 8 of each 21 day cycle. We held his treatment back 1 week because she was feeling "poorly" last week. She was evaluated by her primary care physician for  this. She ended up not having a definitive diagnosis but was some supportive interventions she is now "ready to get going again".  REVIEW OF SYSTEMS: [name] has lost all her hair, but otherwise tolerates the eribulin generally well. She does complains of muscle aches which are not more intense or persistent than before. She continues to have sinus problems. A detailed review of systems today was otherwise entirely stable.  PAST MEDICAL HISTORY: Past Medical History:  Diagnosis Date  . Arthritis   . Cancer ([place])    right breast  . Club foot   . Diabetes mellitus    metformin  . GERD (gastroesophageal reflux disease)    watches diet  . History of radiation therapy 02/22/13- 03/13/13   left proximal humerus 3500 cGy 14 sessions  . Hyperlipidemia   . Hypertension   . Lymphedema    right arm  . Metastasis from malignant tumor of breast ([place])    left humerous  . Metastasis from malignant tumor of breast ([place])    liver  . Neuromuscular disorder ([facility])    lt arm numb sometimes  . Osteoporosis   . Thyroid disease   . Wears glasses     PAST SURGICAL HISTORY: Past Surgical History:  Procedure Laterality Date  . BREAST SURGERY  2012   rt lump-16 nodes-in [place]  . CESAREAN SECTION    . CESAREAN SECTION    . COLONOSCOPY    . DILATION AND CURETTAGE OF UTERUS    . FOOT SURGERY     as a child for club foot  . HUMERUS IM NAIL Left 12/27/2012   Procedure:  INTRAMEDULLARY (IM) NAIL HUMERAL LEFT PATHOLOGIC FRACTURE;  Surgeon: Nita Sells, MD;  Location: [facility];  Service: Orthopedics;  Laterality: Left;  . THYROIDECTOMY  2002    FAMILY HISTORY Family History  Problem Relation Age of Onset  . Cancer Neg Hx   . Arthritis Neg Hx   . Heart disease Neg Hx   . Hyperlipidemia Neg Hx   . Hypertension Neg Hx   . Dementia Mother   . Hearing loss Mother    The patient's father died from unknown causes. The patient's mother is alive at age 69. The patient was a  single child. There is no history of breast or ovarian cancer in the family to her knowledge.  GYNECOLOGIC HISTORY: Menarche age 58, menopause in her early 19s. The patient is GX P1, first pregnancy to term age 11. She did not take hormone replacement  SOCIAL HISTORY: The patient grew up in [place], attended [employer] high school, then moved to [place] where she lived about 40 years. 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 Her mother, Christy Harrell away at 100, on 2014-08-15. The patient's daughter Christy Harrell, 73, is disabled secondary to pulmonary hypertension. She can be reached at [phone]. The patient has 2 grandchildren, [name] who works at a Scientist, physiological business and is 75 years old, in general, 22, completing high school.   ADVANCED DIRECTIVES:not in place  HEALTH MAINTENANCE: Social History  Substance Use Topics  . Smoking status: Former Smoker    Quit date: 12/23/1974  . Smokeless tobacco: Never Used  . Alcohol use No     Colonoscopy:  PAP:    Bone density:  2012, on ibandronate chronically  Lipid panel: UTD  Allergies  Allergen Reactions  . Bydureon [Exenatide] Rash  . Gadolinium Derivatives Other (See Comments)    Pt began having chest numbness/tingling , stated her heart felt funny, had to take her to ED for EKG per RN   CAP  . Enalapril Rash    Current Outpatient Prescriptions  Medication Sig Dispense Refill  . aspirin EC 81 MG tablet Take 1 tablet (81 mg total) by mouth daily.    Marland Kitchen atorvastatin (LIPITOR) 80 MG tablet Take 1 tablet (80 mg total) by mouth daily. 30 tablet 11  . cefdinir (OMNICEF) 300 MG capsule Take 1 capsule (300 mg total) by mouth 2 (two) times daily. 20 capsule 1  . docusate sodium (COLACE) 100 MG capsule Take 1 capsule (100 mg total) by mouth 2 (two) times daily as needed. Reported on 10/17/2015 (Patient taking differently: Take 100 mg by mouth 2 (two) times daily as needed for mild constipation. Reported  on 10/17/2015) 10 capsule 0  . doxepin (SINEQUAN) 25 MG capsule Take 2 capsules (50 mg total) by mouth at bedtime as needed. (Patient taking differently: Take 50 mg by mouth at bedtime as needed (sleep). ) 180 capsule 1  . furosemide (LASIX) 40 MG tablet Take 1 tablet (40 mg total) by mouth daily. 90 tablet 2  . levothyroxine (SYNTHROID) 175 MCG tablet Take 1 tablet (175 mcg total) by mouth daily before breakfast. 90 tablet 1  . lubiprostone (AMITIZA) 24 MCG capsule Take 1 capsule (24 mcg total) by mouth 2 (two) times daily with a meal. (Patient taking differently: Take 24 mcg by mouth 2 (two) times daily as needed for constipation. ) 180 capsule 1  . magic mouthwash SOLN Take 5 mLs by mouth 4 (four) times daily as needed for  mouth pain. 240 mL 3  . metFORMIN (GLUCOPHAGE) 500 MG tablet Take 1 tablet (500 mg total) by mouth daily. 90 tablet 0  . oxyCODONE (OXY IR/ROXICODONE) 5 MG immediate release tablet Take 1-2 tablets (5-10 mg total) by mouth every 6 (six) hours as needed for severe pain. 100 tablet 0  . polyethylene glycol powder (GLYCOLAX/MIRALAX) powder Take 255 g (1 Container total) by mouth daily. (Patient taking differently: Take 1 Container by mouth daily as needed for mild constipation or moderate constipation. ) 255 g 3  . potassium chloride SA (K-DUR,KLOR-CON) 20 MEQ tablet Take 1 tablet (20 mEq total) by mouth 2 (two) times daily. 180 tablet 1  . pregabalin (LYRICA) 75 MG capsule Take 1 capsule (75 mg total) by mouth 2 (two) times daily. Not taking 60 capsule 5  . promethazine-dextromethorphan (PROMETHAZINE-DM) 6.25-15 MG/5ML syrup Take 5 mLs by mouth 4 (four) times daily as needed for cough. 118 mL 0   No current facility-administered medications for this visit.    PHYSICAL EXAM: Elderly African American woman Who appears stated age  11:   05/19/16 1034  BP: (!) 107/55  Pulse: 77  Resp: 18  Temp: 98.1 F (36.7 C)   Body mass index is 30.95 kg/m.    ECOG FS: 1 Filed Weights    05/19/16 1034  Weight: 158 lb 8 oz (71.9 kg)   Sclerae unicteric, EOMs intact Oropharynx clear and moist No cervical or supraclavicular adenopathy Lungs no rales or rhonchi Heart regular rate and rhythm Abd soft, nontender, positive bowel sounds MSK no focal spinal tenderness, no upper extremity lymphedema Neuro: nonfocal, well oriented, appropriate affect Breasts: Deferred    LAB RESULTS: Lab Results  Component Value Date   WBC 4.9 05/19/2016   NEUTROABS 2.3 05/19/2016   HGB 10.4 (L) 05/19/2016   HCT 31.5 (L) 05/19/2016   MCV 87.6 05/19/2016   PLT 291 05/19/2016      Chemistry      Component Value Date/Time   NA 141 05/19/2016 1013   K 3.5 05/19/2016 1013   CL 107 02/21/2016 0410   CL 105 01/23/2013 1337   CO2 26 05/19/2016 1013   BUN 8.4 05/19/2016 1013   CREATININE 0.7 05/19/2016 1013      Component Value Date/Time   CALCIUM 9.2 05/19/2016 1013   ALKPHOS 78 05/19/2016 1013   AST 18 05/19/2016 1013   ALT <9 05/19/2016 1013   BILITOT 0.45 05/19/2016 1013       Lab Results  Component Value Date   LABCA2 24 11/19/2011    STUDIES: Dg Chest 2 View  Result Date: 05/13/2016 CLINICAL DATA:  Cough and congestion.  Chills for 3 weeks EXAM: CHEST  2 VIEW COMPARISON:  February 18, 2016 FINDINGS: There is no edema or consolidation. Heart size and pulmonary vascularity are normal. There is atherosclerotic calcification in the aorta. No adenopathy. Port-A-Cath tip is in the superior vena cava. No pneumothorax. There are old healed fractures of the posterior right seventh and eighth ribs. There are multiple surgical clips over the right hemithorax. There is postoperative change in the proximal humerus on the left with arthropathy in the left shoulder. There also surgical clips in the lower neck region. There is degenerative change throughout the thoracic spine. IMPRESSION: No edema or consolidation. Aortic atherosclerosis. Port-A-Cath tip in superior vena cava. Evidence of  prior surgeries. Old healed rib fractures on the right. Arthropathy in the thoracic spine. Electronically Signed   By: Lowella Grip III M.D.  On: 05/13/2016 10:12     ASSESSMENT: 75 y.o.  [place] woman with stage IV breast cancer as of May 2014  (1)  status post right lower outer quadrant lumpectomy and axillary lymph node dissection 11/17/2010 for a pT2 pN1, stage IIB invasive ductal carcinoma, grade 2, estrogen and progesterone receptor positive, HER-2 negative,   (2) Oncotype DX recurrence score of 27 predicting a risk of distant recurrence of 18% with 5 years of tamoxifen (intermediate score);   (3)  status post radiation completed July of 2012,   (4) started anastrozole July 2012, discontinued June 2014 with evidence of metastatic spread to bone  (5) Chronic lymphedema in the right upper extremity.  (6) pathologic fracture of the left humerus along apparent lytic lesion, with no other bone lesions per bone scan 12/12/2012  (7) on 12/27/2012 underwent #1 intramedullary nail left pathologic proximal humerus fracture  #2 left shoulder rotator cuff repair  #3 left shoulder open bone biopsy with pathology showing metastatic breast adenocarcinoma, estrogen receptor 100% positive, progesterone receptor and HER-2 negative, with an MIB-1 of 26%. PET scan 01/13/2013 showed no other areas of disease   (8) status post 35 Gy to the left proximal humerus completed 03/13/2013(  (9) fulvestrant, started on 01/23/2013, stopped May 2017 with progression  (10) zolendronic acid given monthly, first dose 01/23/2013; changed to denosumab as of August 2015 because of access problems, being given every three months  (a) May 2017 dose help after several extractions  (11) multiple liver lesions noted on scans April 2015   (12) letrozole added May 2015, stopped 04/18/14 because of progression in her liver noted on a chest CT performed on 03/14/14  (13) everolimus and exemestane started 04/19/14,  stopped May 2017  (a) MRI of the abdomen 07/12/15 showed stable disease  (b) repeat abdominal MRI 12/04/2015 showed progression  (14) started CMF chemotherapy (cyclophosphamide, methotrexate, fluorouracil) 12/24/2015  (a) admitted 02/18/2016 with febrile neutropenia, negative cultures  (b) axillofacial CT scan 02/19/2016 shows no soft-tissue abscessbut lucencies in the roots of multiple upper and lower teeth may indicate periapical abscesses  (c) MRI of the liver 02/26/2016 shows disease progression: CMF discontinued  (15) started eribulin 03/31/2016, to be repeated days 1 and 8 of each cycle  (a) cycle 3 delayed one week because of malaise  PLAN: This alone is generally tolerating the eribulin well and the plan will be to continue that through the current cycle and then restage.  She will receive on Pro after the day 8 treatment as she did with cycle 2. She tolerated that well.  She has not yet been called to schedule the MRI of the liver which was previously ordered. If she has not heard from them when she returns next week for treatment she will let us know and we will try to find out why there has been a delayed.  In any case she needs to have this test before she returns to see me October 31. At that time we will decide whether to proceed to a fourth cycle of eribulin her to change to a different treatment.    Chauncey Cruel, MD  05/19/16

## 2016-05-19 NOTE — Patient Instructions (Signed)

## 2016-05-20 LAB — CANCER ANTIGEN 27.29: CAN 27.29: 323.2 U/mL — AB (ref 0.0–38.6)

## 2016-05-25 ENCOUNTER — Other Ambulatory Visit: Payer: Self-pay | Admitting: *Deleted

## 2016-05-25 DIAGNOSIS — C50512 Malignant neoplasm of lower-outer quadrant of left female breast: Secondary | ICD-10-CM

## 2016-05-25 DIAGNOSIS — Z170 Estrogen receptor positive status [ER+]: Principal | ICD-10-CM

## 2016-05-25 DIAGNOSIS — C787 Secondary malignant neoplasm of liver and intrahepatic bile duct: Secondary | ICD-10-CM

## 2016-05-26 ENCOUNTER — Ambulatory Visit (HOSPITAL_BASED_OUTPATIENT_CLINIC_OR_DEPARTMENT_OTHER): Payer: Medicare Other

## 2016-05-26 ENCOUNTER — Other Ambulatory Visit: Payer: Self-pay | Admitting: Oncology

## 2016-05-26 ENCOUNTER — Other Ambulatory Visit (HOSPITAL_BASED_OUTPATIENT_CLINIC_OR_DEPARTMENT_OTHER): Payer: Medicare Other

## 2016-05-26 VITALS — BP 109/50 | HR 56 | Temp 98.3°F | Resp 18

## 2016-05-26 DIAGNOSIS — C787 Secondary malignant neoplasm of liver and intrahepatic bile duct: Secondary | ICD-10-CM

## 2016-05-26 DIAGNOSIS — Z5189 Encounter for other specified aftercare: Secondary | ICD-10-CM

## 2016-05-26 DIAGNOSIS — Z170 Estrogen receptor positive status [ER+]: Principal | ICD-10-CM

## 2016-05-26 DIAGNOSIS — C50512 Malignant neoplasm of lower-outer quadrant of left female breast: Secondary | ICD-10-CM

## 2016-05-26 DIAGNOSIS — C7951 Secondary malignant neoplasm of bone: Secondary | ICD-10-CM

## 2016-05-26 LAB — CBC WITH DIFFERENTIAL/PLATELET
BASO%: 4.6 % — ABNORMAL HIGH (ref 0.0–2.0)
Basophils Absolute: 0.1 10*3/uL (ref 0.0–0.1)
EOS ABS: 0 10*3/uL (ref 0.0–0.5)
EOS%: 0.4 % (ref 0.0–7.0)
HCT: 30.2 % — ABNORMAL LOW (ref 34.8–46.6)
HEMOGLOBIN: 10 g/dL — AB (ref 11.6–15.9)
LYMPH%: 56.5 % — ABNORMAL HIGH (ref 14.0–49.7)
MCH: 28.5 pg (ref 25.1–34.0)
MCHC: 33.1 g/dL (ref 31.5–36.0)
MCV: 86 fL (ref 79.5–101.0)
MONO#: 0.2 10*3/uL (ref 0.1–0.9)
MONO%: 6.9 % (ref 0.0–14.0)
NEUT%: 31.6 % — ABNORMAL LOW (ref 38.4–76.8)
NEUTROS ABS: 0.8 10*3/uL — AB (ref 1.5–6.5)
Platelets: 152 10*3/uL (ref 145–400)
RBC: 3.51 10*6/uL — ABNORMAL LOW (ref 3.70–5.45)
RDW: 17.8 % — AB (ref 11.2–14.5)
WBC: 2.6 10*3/uL — AB (ref 3.9–10.3)
lymph#: 1.5 10*3/uL (ref 0.9–3.3)

## 2016-05-26 LAB — COMPREHENSIVE METABOLIC PANEL
ALBUMIN: 2.8 g/dL — AB (ref 3.5–5.0)
ALK PHOS: 76 U/L (ref 40–150)
ALT: 6 U/L (ref 0–55)
AST: 27 U/L (ref 5–34)
Anion Gap: 9 mEq/L (ref 3–11)
BILIRUBIN TOTAL: 0.46 mg/dL (ref 0.20–1.20)
BUN: 10.1 mg/dL (ref 7.0–26.0)
CO2: 25 mEq/L (ref 22–29)
CREATININE: 0.6 mg/dL (ref 0.6–1.1)
Calcium: 9.2 mg/dL (ref 8.4–10.4)
Chloride: 105 mEq/L (ref 98–109)
GLUCOSE: 84 mg/dL (ref 70–140)
Potassium: 3.3 mEq/L — ABNORMAL LOW (ref 3.5–5.1)
SODIUM: 139 meq/L (ref 136–145)
TOTAL PROTEIN: 6.8 g/dL (ref 6.4–8.3)

## 2016-05-26 LAB — DRAW EXTRA CLOT TUBE

## 2016-05-26 MED ORDER — HEPARIN SOD (PORK) LOCK FLUSH 100 UNIT/ML IV SOLN
500.0000 [IU] | Freq: Once | INTRAVENOUS | Status: AC | PRN
  Administered 2016-05-26: 500 [IU]
  Filled 2016-05-26: qty 5

## 2016-05-26 MED ORDER — SODIUM CHLORIDE 0.9% FLUSH
10.0000 mL | INTRAVENOUS | Status: DC | PRN
  Administered 2016-05-26: 10 mL
  Filled 2016-05-26: qty 10

## 2016-05-26 MED ORDER — PEGFILGRASTIM INJECTION 6 MG/0.6ML ~~LOC~~
6.0000 mg | PREFILLED_SYRINGE | Freq: Once | SUBCUTANEOUS | Status: AC
  Administered 2016-05-26: 6 mg via SUBCUTANEOUS
  Filled 2016-05-26: qty 0.6

## 2016-05-26 NOTE — Patient Instructions (Signed)
Neutropenia Neutropenia is a condition that occurs when the level of a certain type of white blood cell (neutrophil) in your body becomes lower than normal. Neutrophils are made in the bone marrow and fight infections. These cells protect against bacteria and viruses. The fewer neutrophils you have, and the longer your body remains without them, the greater your risk of getting a severe infection becomes. CAUSES  The cause of neutropenia may be hard to determine. However, it is usually due to 3 main problems:   Decreased production of neutrophils. This may be due to:  Certain medicines such as chemotherapy.  Genetic problems.  Cancer.  Radiation treatments.  Vitamin deficiency.  Some pesticides.  Increased destruction of neutrophils. This may be due to:  Overwhelming infections.  Hemolytic anemia. This is when the body destroys its own blood cells.  Chemotherapy.  Neutrophils moving to areas of the body where they cannot fight infections. This may be due to:  Dialysis procedures.  Conditions where the spleen becomes enlarged. Neutrophils are held in the spleen and are not available to the rest of the body.  Overwhelming infections. The neutrophils are held in the area of the infection and are not available to the rest of the body. SYMPTOMS  There are no specific symptoms of neutropenia. The lack of neutrophils can result in an infection, and an infection can cause various problems. DIAGNOSIS  Diagnosis is made by a blood test. A complete blood count is performed. The normal level of neutrophils in human blood differs with age and race. Infants have lower counts than older children and adults. African Americans have lower counts than Caucasians or Asians. The average adult level is 1500 cells/mm3 of blood. Neutrophil counts are interpreted as follows:  Greater than 1000 cells/mm3 gives normal protection against infection.  500 to 1000 cells/mm3 gives an increased risk for  infection.  200 to 500 cells/mm3 is a greater risk for severe infection.  Lower than 200 cells/mm3 is a marked risk of infection. This may require hospitalization and treatment with antibiotic medicines. TREATMENT  Treatment depends on the underlying cause, severity, and presence of infections or symptoms. It also depends on your health. Your caregiver will discuss the treatment plan with you. Mild cases are often easily treated and have a good outcome. Preventative measures may also be started to limit your risk of infections. Treatment can include:  Taking antibiotics.  Stopping medicines that are known to cause neutropenia.  Correcting nutritional deficiencies by eating green vegetables to supply folic acid and taking vitamin B supplements.  Stopping exposure to pesticides if your neutropenia is related to pesticide exposure.  Taking a blood growth factor called sargramostim, pegfilgrastim, or filgrastim if you are undergoing chemotherapy for cancer. This stimulates white blood cell production.  Removal of the spleen if you have Felty's syndrome and have repeated infections. HOME CARE INSTRUCTIONS   Follow your caregiver's instructions about when you need to have blood work done.  Wash your hands often. Make sure others who come in contact with you also wash their hands.  Wash raw fruits and vegetables before eating them. They can carry bacteria and fungi.  Avoid people with colds or spreadable (contagious) diseases (chickenpox, herpes zoster, influenza).  Avoid large crowds.  Avoid construction areas. The dust can release fungus into the air.  Be cautious around children in daycare or school environments.  Take care of your respiratory system by coughing and deep breathing.  Bathe daily.  Protect your skin from cuts and   burns.  Do not work in the garden or with flowers and plants.  Care for the mouth before and after meals by brushing with a soft toothbrush. If you have  mucositis, do not use mouthwash. Mouthwash contains alcohol and can dry out the mouth even more.  Clean the area between the genitals and the anus (perineal area) after urination and bowel movements. Women need to wipe from front to back.  Use a water soluble lubricant during sexual intercourse and practice good hygiene after. Do not have intercourse if you are severely neutropenic. Check with your caregiver for guidelines.  Exercise daily as tolerated.  Avoid people who were vaccinated with a live vaccine in the past 30 days. You should not receive live vaccines (polio, typhoid).  Do not provide direct care for pets. Avoid animal droppings. Do not clean litter boxes and bird cages.  Do not share food utensils.  Do not use tampons, enemas, or rectal suppositories unless directed by your caregiver.  Use an electric razor to remove hair.  Wash your hands after handling magazines, letters, and newspapers. SEEK IMMEDIATE MEDICAL CARE IF:   You have a fever.  You have chills or start to shake.  You feel nauseous or vomit.  You develop mouth sores.  You develop aches and pains.  You have redness and swelling around open wounds.  Your skin is warm to the touch.  You have pus coming from your wounds.  You develop swollen lymph nodes.  You feel weak or fatigued.  You develop red streaks on the skin. MAKE SURE YOU:  Understand these instructions.  Will watch your condition.  Will get help right away if you are not doing well or get worse.   This information is not intended to replace advice given to you by your health care provider. Make sure you discuss any questions you have with your health care provider.   Document Released: 01/16/2002 Document Revised: 10/19/2011 Document Reviewed: 02/06/2015 Elsevier Interactive Patient Education 2016 [employer]  

## 2016-05-26 NOTE — Progress Notes (Signed)
Yesterday she does get the Neulasta which is in their but not the chemotherapy

## 2016-05-26 NOTE — Progress Notes (Signed)
Dr. [name] notified of neutrophils of 0.8 on today's labs, will not get treatment today. Will get neulasta injection today before discharged home.

## 2016-06-02 ENCOUNTER — Other Ambulatory Visit: Payer: Medicare Other

## 2016-06-02 ENCOUNTER — Ambulatory Visit: Payer: Medicare Other

## 2016-06-03 ENCOUNTER — Ambulatory Visit (HOSPITAL_COMMUNITY)
Admission: RE | Admit: 2016-06-03 | Discharge: 2016-06-03 | Disposition: A | Discharge status: Home / Self Care | Payer: Medicare Other | Source: Ambulatory Visit | Attending: Oncology | Admitting: Oncology

## 2016-06-03 ENCOUNTER — Other Ambulatory Visit: Payer: Self-pay | Admitting: Oncology

## 2016-06-03 DIAGNOSIS — C50512 Malignant neoplasm of lower-outer quadrant of left female breast: Secondary | ICD-10-CM

## 2016-06-03 DIAGNOSIS — M47896 Other spondylosis, lumbar region: Secondary | ICD-10-CM | POA: Insufficient documentation

## 2016-06-03 DIAGNOSIS — M5136 Other intervertebral disc degeneration, lumbar region: Secondary | ICD-10-CM | POA: Diagnosis not present

## 2016-06-03 DIAGNOSIS — C787 Secondary malignant neoplasm of liver and intrahepatic bile duct: Secondary | ICD-10-CM | POA: Diagnosis not present

## 2016-06-03 DIAGNOSIS — C7951 Secondary malignant neoplasm of bone: Secondary | ICD-10-CM | POA: Insufficient documentation

## 2016-06-03 DIAGNOSIS — N6489 Other specified disorders of breast: Secondary | ICD-10-CM | POA: Diagnosis not present

## 2016-06-03 DIAGNOSIS — Z170 Estrogen receptor positive status [ER+]: Secondary | ICD-10-CM

## 2016-06-04 ENCOUNTER — Ambulatory Visit: Payer: Medicare Other | Admitting: Oncology

## 2016-06-08 ENCOUNTER — Other Ambulatory Visit: Payer: Self-pay

## 2016-06-08 DIAGNOSIS — C50512 Malignant neoplasm of lower-outer quadrant of left female breast: Secondary | ICD-10-CM

## 2016-06-09 ENCOUNTER — Other Ambulatory Visit (HOSPITAL_BASED_OUTPATIENT_CLINIC_OR_DEPARTMENT_OTHER): Payer: Medicare Other

## 2016-06-09 ENCOUNTER — Other Ambulatory Visit: Payer: Medicare Other

## 2016-06-09 ENCOUNTER — Ambulatory Visit (HOSPITAL_BASED_OUTPATIENT_CLINIC_OR_DEPARTMENT_OTHER): Payer: Medicare Other

## 2016-06-09 ENCOUNTER — Ambulatory Visit: Payer: Medicare Other

## 2016-06-09 ENCOUNTER — Ambulatory Visit (HOSPITAL_BASED_OUTPATIENT_CLINIC_OR_DEPARTMENT_OTHER): Payer: Medicare Other | Admitting: Oncology

## 2016-06-09 VITALS — BP 104/41 | HR 89 | Temp 97.8°F | Resp 18 | Ht 60.0 in | Wt 168.2 lb

## 2016-06-09 DIAGNOSIS — C50512 Malignant neoplasm of lower-outer quadrant of left female breast: Secondary | ICD-10-CM

## 2016-06-09 DIAGNOSIS — Z170 Estrogen receptor positive status [ER+]: Secondary | ICD-10-CM

## 2016-06-09 DIAGNOSIS — C787 Secondary malignant neoplasm of liver and intrahepatic bile duct: Secondary | ICD-10-CM

## 2016-06-09 DIAGNOSIS — C7951 Secondary malignant neoplasm of bone: Secondary | ICD-10-CM

## 2016-06-09 DIAGNOSIS — Z5189 Encounter for other specified aftercare: Secondary | ICD-10-CM | POA: Diagnosis not present

## 2016-06-09 DIAGNOSIS — Z95828 Presence of other vascular implants and grafts: Secondary | ICD-10-CM

## 2016-06-09 DIAGNOSIS — I890 Lymphedema, not elsewhere classified: Secondary | ICD-10-CM

## 2016-06-09 LAB — CBC WITH DIFFERENTIAL/PLATELET
BASO%: 0.9 % (ref 0.0–2.0)
BASOS ABS: 0.1 10*3/uL (ref 0.0–0.1)
EOS ABS: 0 10*3/uL (ref 0.0–0.5)
EOS%: 0.2 % (ref 0.0–7.0)
HCT: 30.7 % — ABNORMAL LOW (ref 34.8–46.6)
HEMOGLOBIN: 10.1 g/dL — AB (ref 11.6–15.9)
LYMPH%: 25 % (ref 14.0–49.7)
MCH: 28.9 pg (ref 25.1–34.0)
MCHC: 32.9 g/dL (ref 31.5–36.0)
MCV: 87.8 fL (ref 79.5–101.0)
MONO#: 0.9 10*3/uL (ref 0.1–0.9)
MONO%: 12.7 % (ref 0.0–14.0)
NEUT#: 4.2 10*3/uL (ref 1.5–6.5)
NEUT%: 61.2 % (ref 38.4–76.8)
Platelets: 216 10*3/uL (ref 145–400)
RBC: 3.5 10*6/uL — ABNORMAL LOW (ref 3.70–5.45)
RDW: 20 % — AB (ref 11.2–14.5)
WBC: 6.8 10*3/uL (ref 3.9–10.3)
lymph#: 1.7 10*3/uL (ref 0.9–3.3)

## 2016-06-09 LAB — COMPREHENSIVE METABOLIC PANEL
ALT: 7 U/L (ref 0–55)
AST: 20 U/L (ref 5–34)
Albumin: 2.8 g/dL — ABNORMAL LOW (ref 3.5–5.0)
Alkaline Phosphatase: 101 U/L (ref 40–150)
Anion Gap: 9 mEq/L (ref 3–11)
BILIRUBIN TOTAL: 0.32 mg/dL (ref 0.20–1.20)
BUN: 11.8 mg/dL (ref 7.0–26.0)
CHLORIDE: 104 meq/L (ref 98–109)
CO2: 28 meq/L (ref 22–29)
CREATININE: 0.7 mg/dL (ref 0.6–1.1)
Calcium: 9.2 mg/dL (ref 8.4–10.4)
EGFR: >90 mL/min/{1.73_m2} (ref >90)
GLUCOSE: 81 mg/dL (ref 70–140)
Potassium: 3.8 mEq/L (ref 3.5–5.1)
SODIUM: 141 meq/L (ref 136–145)
TOTAL PROTEIN: 6.8 g/dL (ref 6.4–8.3)

## 2016-06-09 MED ORDER — AMOXICILLIN 500 MG PO TABS: 500.0000 mg | ORAL_TABLET | Freq: Three times a day (TID) | ORAL | 0 refills | Status: DC

## 2016-06-09 MED ORDER — HEPARIN SOD (PORK) LOCK FLUSH 100 UNIT/ML IV SOLN
500.0000 [IU] | Freq: Once | INTRAVENOUS | Status: AC | PRN
  Administered 2016-06-09: 500 [IU]
  Filled 2016-06-09: qty 5

## 2016-06-09 MED ORDER — SODIUM CHLORIDE 0.9% FLUSH
10.0000 mL | INTRAVENOUS | Status: DC | PRN
  Administered 2016-06-09: 10 mL
  Filled 2016-06-09: qty 10

## 2016-06-09 MED ORDER — PEGFILGRASTIM 6 MG/0.6ML ~~LOC~~ PSKT
6.0000 mg | PREFILLED_SYRINGE | Freq: Once | SUBCUTANEOUS | Status: AC
  Administered 2016-06-09: 6 mg via SUBCUTANEOUS
  Filled 2016-06-09: qty 0.6

## 2016-06-09 MED ORDER — PROCHLORPERAZINE MALEATE 10 MG PO TABS
10.0000 mg | ORAL_TABLET | Freq: Once | ORAL | Status: AC
  Administered 2016-06-09: 10 mg via ORAL

## 2016-06-09 MED ORDER — SODIUM CHLORIDE 0.9 % IV SOLN
Freq: Once | INTRAVENOUS | Status: AC
  Administered 2016-06-09: 10:00:00 via INTRAVENOUS

## 2016-06-09 MED ORDER — SODIUM CHLORIDE 0.9 % IJ SOLN
10.0000 mL | INTRAMUSCULAR | Status: DC | PRN
  Administered 2016-06-09: 10 mL via INTRAVENOUS
  Filled 2016-06-09: qty 10

## 2016-06-09 MED ORDER — ERIBULIN MESYLATE CHEMO INJECTION 1 MG/2ML
1.4300 mg/m2 | Freq: Once | INTRAVENOUS | Status: AC
  Administered 2016-06-09: 2.5 mg via INTRAVENOUS
  Filled 2016-06-09: qty 5

## 2016-06-09 MED ORDER — PROCHLORPERAZINE MALEATE 10 MG PO TABS
ORAL_TABLET | ORAL | Status: AC
  Filled 2016-06-09: qty 1

## 2016-06-09 NOTE — Patient Instructions (Signed)
[facility] Discharge Instructions for Patients Receiving Chemotherapy  Today you received the following chemotherapy agents halaven and neulasta  To help prevent nausea and vomiting after your treatment, we encourage you to take your nausea medication as directed   If you develop nausea and vomiting that is not controlled by your nausea medication, call the clinic.   BELOW ARE SYMPTOMS THAT SHOULD BE REPORTED IMMEDIATELY:  *FEVER GREATER THAN 100.5 F  *CHILLS WITH OR WITHOUT FEVER  NAUSEA AND VOMITING THAT IS NOT CONTROLLED WITH YOUR NAUSEA MEDICATION  *UNUSUAL SHORTNESS OF BREATH  *UNUSUAL BRUISING OR BLEEDING  TENDERNESS IN MOUTH AND THROAT WITH OR WITHOUT PRESENCE OF ULCERS  *URINARY PROBLEMS  *BOWEL PROBLEMS  UNUSUAL RASH Items with * indicate a potential emergency and should be followed up as soon as possible.  Feel free to call the clinic you have any questions or concerns. The clinic phone number is [phone].

## 2016-06-09 NOTE — Progress Notes (Signed)
ID: Christy Harrell   DOB: 02-13-1941  MR#: [id number]  AST#:[id number]  PCP: [name], MD GYN:  SU:  OTHER MD:  Alysia Penna, Frederik Pear, Mellody Life DDS  CC: Breast cancer stage IV  TREATMENT: eribulin, [denosumab]  BREAST CANCER HISTORY: From the original intake note:  The patient had routine screening mammography 10/16/2010 showing a lobulated mass in the right subareolar region measuring up to 5.5 cm. The Left breast showed some central microcalcifications. Biopsy of the Right breast mass on 10/28/2010 showed an invasive ductal carcinoma, grade 2, estrogen receptor positive (Allred score 8), progesterone receptor positive (Allred score 5), with an equivocal HER-2. The Left breast area of microcalcifications was biopsied at the same time, and was read as suspicious for DCIS.  On 11/17/2010 the patient underwent Right lumpectomy and axillary lymph node dissection for what proved to be a 3 cm invasive ductal carcinoma, grade 2, with some papillary and mucinous features. One of 16 lymph nodes was involved. FISH showed no HER-2 amplification. Left breast biopsy was benign.  The patient had an Oncotype sent, with a score of 27, predicting a risk of distant recurrence after 5 years of tamoxifen in the 18% range. With this intermediate result, the decision was made not to proceed with chemotherapy, since the patient is the sole caregiver to her very elderly mother. Instead the patient proceeded to radiation treatment which was completed 03/06/2011. She started anastrozole at that point. Her subsequent history is as detailed below   INTERVAL HISTORY: Drina returns today for follow up of her estrogen receptor positive breast cancer. She continues on eribulin which she tolerates moderately well. Today is day 1 cycle 4 of 6 planned cycles given days 1 and 8 of each 21 days.  Since her last visit here, after her third cycle of  chemotherapy, she had restaging studies showing a mixed response in the liver, which is her cytoreductive measurable disease.  REVIEW OF SYSTEMS: Magdalen is beginning to experience more side effects from the treatment. She feels tired. She has many friends who help her with many of the things she finds it hard to do including shopping and cooking. Some days she just rests. She continues to have problems with her gums and teeth. She wonders if she is going to have to have multiple extractions. She also has sinus problems, ankle swelling, joint pains here and there, and rare hot flashes. Of course she continues to have massive right upper extremity lymphedema. A detailed review of systems today was otherwise stable  PAST MEDICAL HISTORY: Past Medical History:  Diagnosis Date  . Arthritis   . Cancer ([place])    right breast  . Club foot   . Diabetes mellitus    metformin  . GERD (gastroesophageal reflux disease)    watches diet  . History of radiation therapy 02/22/13- 03/13/13   left proximal humerus 3500 cGy 14 sessions  . Hyperlipidemia   . Hypertension   . Lymphedema    right arm  . Metastasis from malignant tumor of breast ([facility])    left humerous  . Metastasis from malignant tumor of breast ([facility])    liver  . Neuromuscular disorder ([facility])    lt arm numb sometimes  . Osteoporosis   . Thyroid disease   . Wears glasses     PAST SURGICAL HISTORY: Past Surgical History:  Procedure Laterality Date  . BREAST SURGERY  2012   rt lump-16 nodes-in [place]  .  CESAREAN SECTION    . CESAREAN SECTION    . COLONOSCOPY    . DILATION AND CURETTAGE OF UTERUS    . FOOT SURGERY     as a child for club foot  . HUMERUS IM NAIL Left 12/27/2012   Procedure: INTRAMEDULLARY (IM) NAIL HUMERAL LEFT PATHOLOGIC FRACTURE;  Surgeon: Nita Sells, MD;  Location: [facility];  Service: Orthopedics;  Laterality: Left;  . THYROIDECTOMY  2002    FAMILY HISTORY Family History  Problem  Relation Age of Onset  . Cancer Neg Hx   . Arthritis Neg Hx   . Heart disease Neg Hx   . Hyperlipidemia Neg Hx   . Hypertension Neg Hx   . Dementia Mother   . Hearing loss Mother    The patient's father died from unknown causes. The patient's mother is alive at age 29. The patient was a single child. There is no history of breast or ovarian cancer in the family to her knowledge.  GYNECOLOGIC HISTORY: Menarche age 39, menopause in her early 48s. The patient is GX P1, first pregnancy to term age 75. She did not take hormone replacement  SOCIAL HISTORY: The patient grew up in [place], attended [employer] high school, then moved to [place] where she lived about 40 years. 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 Her mother, [name] away at 100, on 2014-08-22. The patient's daughter [name], 74, is disabled secondary to pulmonary hypertension. She can be reached at [phone]. The patient has 2 grandchildren, [name] who works at a Scientist, physiological business and is 75 years old, in general, 14, completing high school.   ADVANCED DIRECTIVES:not in place  HEALTH MAINTENANCE: Social History  Substance Use Topics  . Smoking status: Former Smoker    Quit date: 12/23/1974  . Smokeless tobacco: Never Used  . Alcohol use No     Colonoscopy:  PAP:    Bone density:  2012, on ibandronate chronically  Lipid panel: UTD  Allergies  Allergen Reactions  . Bydureon [Exenatide] Rash  . Gadolinium Derivatives Other (See Comments)    Pt began having chest numbness/tingling , stated her heart felt funny, had to take her to ED for EKG per RN   CAP  . Enalapril Rash    Current Outpatient Prescriptions  Medication Sig Dispense Refill  . aspirin EC 81 MG tablet Take 1 tablet (81 mg total) by mouth daily.    Marland Kitchen atorvastatin (LIPITOR) 80 MG tablet Take 1 tablet (80 mg total) by mouth daily. 30 tablet 11  . docusate sodium (COLACE) 100 MG capsule Take 1 capsule  (100 mg total) by mouth 2 (two) times daily as needed. Reported on 10/17/2015 (Patient taking differently: Take 100 mg by mouth 2 (two) times daily as needed for mild constipation. Reported on 10/17/2015) 10 capsule 0  . doxepin (SINEQUAN) 25 MG capsule Take 2 capsules (50 mg total) by mouth at bedtime as needed. (Patient taking differently: Take 50 mg by mouth at bedtime as needed (sleep). ) 180 capsule 1  . furosemide (LASIX) 40 MG tablet Take 1 tablet (40 mg total) by mouth daily. 90 tablet 2  . levothyroxine (SYNTHROID) 175 MCG tablet Take 1 tablet (175 mcg total) by mouth daily before breakfast. 90 tablet 1  . lubiprostone (AMITIZA) 24 MCG capsule Take 1 capsule (24 mcg total) by mouth 2 (two) times daily with a meal. (Patient taking differently: Take 24 mcg by mouth 2 (two) times  daily as needed for constipation. ) 180 capsule 1  . magic mouthwash SOLN Take 5 mLs by mouth 4 (four) times daily as needed for mouth pain. 240 mL 3  . metFORMIN (GLUCOPHAGE) 500 MG tablet Take 1 tablet (500 mg total) by mouth daily. 90 tablet 0  . oxyCODONE (OXY IR/ROXICODONE) 5 MG immediate release tablet Take 1-2 tablets (5-10 mg total) by mouth every 6 (six) hours as needed for severe pain. 100 tablet 0  . polyethylene glycol powder (GLYCOLAX/MIRALAX) powder Take 255 g (1 Container total) by mouth daily. (Patient taking differently: Take 1 Container by mouth daily as needed for mild constipation or moderate constipation. ) 255 g 3  . potassium chloride SA (K-DUR,KLOR-CON) 20 MEQ tablet Take 1 tablet (20 mEq total) by mouth 2 (two) times daily. 180 tablet 1  . pregabalin (LYRICA) 75 MG capsule Take 1 capsule (75 mg total) by mouth 2 (two) times daily. Not taking 60 capsule 5  . promethazine-dextromethorphan (PROMETHAZINE-DM) 6.25-15 MG/5ML syrup Take 5 mLs by mouth 4 (four) times daily as needed for cough. 118 mL 0   No current facility-administered medications for this visit.    PHYSICAL EXAM: Elderly African American  woman   Vitals:   06/09/16 0923  BP: (!) 104/41  Pulse: 89  Resp: 18  Temp: 97.8 F (36.6 C)   Body mass index is 32.85 kg/m.    ECOG FS: 2 Filed Weights   06/09/16 0923  Weight: 168 lb 3.2 oz (76.3 kg)    Sclerae unicteric, pupils round and equal Oropharynx clear and moist-- no thrush or other lesions--slight gum erythema No cervical or supraclavicular adenopathy Lungs no rales or rhonchi Heart regular rate and rhythm Abd soft, obese, nontender, positive bowel sounds MSK no focal spinal tenderness, chronic right upper extremity lymphedema grade 3  Neuro: nonfocal, well oriented, appropriate affect Breasts: Deferred     LAB RESULTS: Lab Results  Component Value Date   WBC 6.8 06/09/2016   NEUTROABS 4.2 06/09/2016   HGB 10.1 (L) 06/09/2016   HCT 30.7 (L) 06/09/2016   MCV 87.8 06/09/2016   PLT 216 06/09/2016      Chemistry      Component Value Date/Time   NA 141 06/09/2016 0847   K 3.8 06/09/2016 0847   CL 107 02/21/2016 0410   CL 105 01/23/2013 1337   CO2 28 06/09/2016 0847   BUN 11.8 06/09/2016 0847   CREATININE 0.7 06/09/2016 0847      Component Value Date/Time   CALCIUM 9.2 06/09/2016 0847   ALKPHOS 101 06/09/2016 0847   AST 20 06/09/2016 0847   ALT 7 06/09/2016 0847   BILITOT 0.32 06/09/2016 0847       Lab Results  Component Value Date   LABCA2 24 11/19/2011    STUDIES: Dg Chest 2 View  Result Date: 05/13/2016 CLINICAL DATA:  Cough and congestion.  Chills for 3 weeks EXAM: CHEST  2 VIEW COMPARISON:  February 18, 2016 FINDINGS: There is no edema or consolidation. Heart size and pulmonary vascularity are normal. There is atherosclerotic calcification in the aorta. No adenopathy. Port-A-Cath tip is in the superior vena cava. No pneumothorax. There are old healed fractures of the posterior right seventh and eighth ribs. There are multiple surgical clips over the right hemithorax. There is postoperative change in the proximal humerus on the left with  arthropathy in the left shoulder. There also surgical clips in the lower neck region. There is degenerative change throughout the thoracic spine. IMPRESSION:  No edema or consolidation. Aortic atherosclerosis. Port-A-Cath tip in superior vena cava. Evidence of prior surgeries. Old healed rib fractures on the right. Arthropathy in the thoracic spine. Electronically Signed   By: Lowella Grip III M.D.   On: 05/13/2016 10:12   Mr Liver Wo Conrtast  Result Date: 06/03/2016 CLINICAL DATA:  Breast cancer with bone and liver metastatic disease. EXAM: MRI ABDOMEN WITHOUT CONTRAST TECHNIQUE: Multiplanar multisequence MR imaging was performed without the administration of intravenous contrast. COMPARISON:  02/26/2016 FINDINGS: The patient refused IV contrast. Despite efforts by the technologist and patient, motion artifact is present on today's exam and could not be eliminated. This reduces exam sensitivity and specificity. Lower chest: Postoperative findings in the right breast including a fluid collection medially. Prior trace right pleural effusion has resolved. Hepatobiliary: Hepatic metastatic lesions are again identified, demonstrating high T2 signal. In the index lesion posteriorly in the right hepatic lobe on image 22/4 measures 6.0 by 4.9 cm, formerly 4.8 by 4.4 cm. The however, the lesion in the lateral segment left hepatic lobe measures about 4.9 by 4.4 cm today, previously 5.6 by 5.4 cm. Accordingly this is a somewhat mixed appearance and overall the burden is similar to prior. Pancreas:  Unremarkable Spleen:  Unremarkable Adrenals/Urinary Tract:  Unremarkable Stomach/Bowel: Unremarkable Vascular/Lymphatic:  Unremarkable Other:  No supplemental non-categorized findings. Musculoskeletal: Mild lumbar spondylosis and degenerative disc disease. IMPRESSION: 1. Taking into account some mild enlargement and some mild reduction in size of various lesions, the overall burden of hepatic metastatic disease is stable.  2. Lumbar spondylosis and degenerative disc disease. 3. Postoperative findings in the right breast include a medial fluid collection. Electronically Signed   By: Van Clines M.D.   On: 06/03/2016 16:26     ASSESSMENT: 75 y.o.  [place] woman with stage IV breast cancer as of May 2014  (1)  status post right lower outer quadrant lumpectomy and axillary lymph node dissection 11/17/2010 for a pT2 pN1, stage IIB invasive ductal carcinoma, grade 2, estrogen and progesterone receptor positive, HER-2 negative,   (2) Oncotype DX recurrence score of 27 predicting a risk of distant recurrence of 18% with 5 years of tamoxifen (intermediate score);   (3)  status post radiation completed July of 2012,   (4) started anastrozole July 2012, discontinued June 2014 with evidence of metastatic spread to bone  (5) Chronic lymphedema in the right upper extremity.  (6) pathologic fracture of the left humerus along apparent lytic lesion, with no other bone lesions per bone scan 12/12/2012  (7) on 12/27/2012 underwent #1 intramedullary nail left pathologic proximal humerus fracture  #2 left shoulder rotator cuff repair  #3 left shoulder open bone biopsy with pathology showing metastatic breast adenocarcinoma, estrogen receptor 100% positive, progesterone receptor and HER-2 negative, with an MIB-1 of 26%. PET scan 01/13/2013 showed no other areas of disease   (8) status post 35 Gy to the left proximal humerus completed 03/13/2013(  (9) fulvestrant, started on 01/23/2013, stopped May 2017 with progression  (10) zolendronic acid given monthly, first dose 01/23/2013; changed to denosumab as of August 2015 because of access problems, being given every three months  (a) May 2017 dose held after several extractions--on hold pending definitive dental clearance  (11) multiple liver lesions noted on scans April 2015   (12) letrozole added May 2015, stopped 04/18/14 because of progression in her liver noted on  a chest CT performed on 03/14/14  (13) everolimus and exemestane started 04/19/14, stopped May 2017  (a) MRI of  the abdomen 07/12/15 showed stable disease  (b) repeat abdominal MRI 12/04/2015 showed progression  (14) started CMF chemotherapy (cyclophosphamide, methotrexate, fluorouracil) 12/24/2015  (a) admitted 02/18/2016 with febrile neutropenia, negative cultures  (b) axillofacial CT scan 02/19/2016 shows no soft-tissue abscessbut lucencies in the roots of multiple upper and lower teeth may indicate periapical abscesses  (c) MRI of the liver 02/26/2016 shows disease progression: CMF discontinued  (15) started eribulin 03/31/2016, to be repeated days 1 and 8 of each cycle  (a) cycle 3 delayed one week because of malaise  PLAN: Dayra is now 3-1/2 years out from definitive diagnosis of metastatic breast cancer. We just restaged her and the scans do show a measure of control, although also some growth in one of the liver lesions.   I discussed this extensively with the patient and we considered multiple alternatives. What she would like to do at this point is continue the eribulin. If so I think we will have to do an every 2 weeks, otherwise we are going to have multiple delays and cytopenias with significant risk of infection and other complications  Accordingly she will be treated today and receive on Pro as well. Her next dose will be in 2 weeks. She will then see me again in 4 weeks from now with her next dose. At that point I will set her up for one more dose and then restaging studies  Laurin has a good understanding of this plan and agrees with it. She will continue to work with her dentist to get her teeth problems taking care of at which point we will be able to treat her with denosumab for bony metastatic disease. She knows to call for any other problems that may develop before her next visit here.   Chauncey Cruel, MD  06/09/16

## 2016-06-16 ENCOUNTER — Ambulatory Visit: Payer: Medicare Other

## 2016-06-22 ENCOUNTER — Other Ambulatory Visit: Payer: Self-pay | Admitting: *Deleted

## 2016-06-22 DIAGNOSIS — C50512 Malignant neoplasm of lower-outer quadrant of left female breast: Secondary | ICD-10-CM

## 2016-06-22 DIAGNOSIS — Z170 Estrogen receptor positive status [ER+]: Principal | ICD-10-CM

## 2016-06-22 DIAGNOSIS — C7951 Secondary malignant neoplasm of bone: Secondary | ICD-10-CM

## 2016-06-22 DIAGNOSIS — C787 Secondary malignant neoplasm of liver and intrahepatic bile duct: Secondary | ICD-10-CM

## 2016-06-23 ENCOUNTER — Other Ambulatory Visit (HOSPITAL_BASED_OUTPATIENT_CLINIC_OR_DEPARTMENT_OTHER): Payer: Medicare Other

## 2016-06-23 ENCOUNTER — Ambulatory Visit (HOSPITAL_BASED_OUTPATIENT_CLINIC_OR_DEPARTMENT_OTHER): Payer: Medicare Other

## 2016-06-23 VITALS — BP 110/55 | HR 65 | Temp 98.4°F | Resp 18

## 2016-06-23 DIAGNOSIS — C7951 Secondary malignant neoplasm of bone: Secondary | ICD-10-CM

## 2016-06-23 DIAGNOSIS — C787 Secondary malignant neoplasm of liver and intrahepatic bile duct: Secondary | ICD-10-CM | POA: Diagnosis not present

## 2016-06-23 DIAGNOSIS — Z170 Estrogen receptor positive status [ER+]: Secondary | ICD-10-CM

## 2016-06-23 DIAGNOSIS — Z5189 Encounter for other specified aftercare: Secondary | ICD-10-CM

## 2016-06-23 DIAGNOSIS — C50512 Malignant neoplasm of lower-outer quadrant of left female breast: Secondary | ICD-10-CM

## 2016-06-23 LAB — COMPREHENSIVE METABOLIC PANEL
ALT: <6 U/L (ref 0–55)
AST: 20 U/L (ref 5–34)
Albumin: 2.8 g/dL — ABNORMAL LOW (ref 3.5–5.0)
Alkaline Phosphatase: 121 U/L (ref 40–150)
Anion Gap: 10 mEq/L (ref 3–11)
BUN: 5.5 mg/dL — ABNORMAL LOW (ref 7.0–26.0)
CHLORIDE: 105 meq/L (ref 98–109)
CO2: 26 meq/L (ref 22–29)
Calcium: 9.2 mg/dL (ref 8.4–10.4)
Creatinine: 0.6 mg/dL (ref 0.6–1.1)
GLUCOSE: 94 mg/dL (ref 70–140)
POTASSIUM: 3.5 meq/L (ref 3.5–5.1)
SODIUM: 141 meq/L (ref 136–145)
Total Bilirubin: 0.55 mg/dL (ref 0.20–1.20)
Total Protein: 6.8 g/dL (ref 6.4–8.3)

## 2016-06-23 LAB — CBC WITH DIFFERENTIAL/PLATELET
BASO%: 0.2 % (ref 0.0–2.0)
BASOS ABS: 0 10*3/uL (ref 0.0–0.1)
EOS%: 0.2 % (ref 0.0–7.0)
Eosinophils Absolute: 0 10*3/uL (ref 0.0–0.5)
HCT: 32 % — ABNORMAL LOW (ref 34.8–46.6)
HEMOGLOBIN: 10.5 g/dL — AB (ref 11.6–15.9)
LYMPH%: 27 % (ref 14.0–49.7)
MCH: 28.8 pg (ref 25.1–34.0)
MCHC: 32.8 g/dL (ref 31.5–36.0)
MCV: 87.9 fL (ref 79.5–101.0)
MONO#: 0.8 10*3/uL (ref 0.1–0.9)
MONO%: 11.5 % (ref 0.0–14.0)
NEUT#: 4 10*3/uL (ref 1.5–6.5)
NEUT%: 61.1 % (ref 38.4–76.8)
Platelets: 211 10*3/uL (ref 145–400)
RBC: 3.64 10*6/uL — ABNORMAL LOW (ref 3.70–5.45)
RDW: 20 % — AB (ref 11.2–14.5)
WBC: 6.6 10*3/uL (ref 3.9–10.3)
lymph#: 1.8 10*3/uL (ref 0.9–3.3)

## 2016-06-23 MED ORDER — PROCHLORPERAZINE MALEATE 10 MG PO TABS
ORAL_TABLET | ORAL | Status: AC
  Filled 2016-06-23: qty 1

## 2016-06-23 MED ORDER — ERIBULIN MESYLATE CHEMO INJECTION 1 MG/2ML
1.4300 mg/m2 | Freq: Once | INTRAVENOUS | Status: AC
  Administered 2016-06-23: 2.5 mg via INTRAVENOUS
  Filled 2016-06-23: qty 3.5

## 2016-06-23 MED ORDER — HEPARIN SOD (PORK) LOCK FLUSH 100 UNIT/ML IV SOLN
500.0000 [IU] | Freq: Once | INTRAVENOUS | Status: AC | PRN
  Administered 2016-06-23: 500 [IU]
  Filled 2016-06-23: qty 5

## 2016-06-23 MED ORDER — SODIUM CHLORIDE 0.9 % IV SOLN
Freq: Once | INTRAVENOUS | Status: AC
  Administered 2016-06-23: 12:00:00 via INTRAVENOUS

## 2016-06-23 MED ORDER — SODIUM CHLORIDE 0.9% FLUSH
10.0000 mL | INTRAVENOUS | Status: DC | PRN
  Administered 2016-06-23: 10 mL
  Filled 2016-06-23: qty 10

## 2016-06-23 MED ORDER — PEGFILGRASTIM 6 MG/0.6ML ~~LOC~~ PSKT
6.0000 mg | PREFILLED_SYRINGE | Freq: Once | SUBCUTANEOUS | Status: AC
  Administered 2016-06-23: 6 mg via SUBCUTANEOUS
  Filled 2016-06-23: qty 0.6

## 2016-06-23 MED ORDER — PROCHLORPERAZINE MALEATE 10 MG PO TABS
10.0000 mg | ORAL_TABLET | Freq: Once | ORAL | Status: AC
  Administered 2016-06-23: 10 mg via ORAL

## 2016-06-23 NOTE — Patient Instructions (Signed)
[facility] Discharge Instructions for Patients Receiving Chemotherapy  Today you received the following chemotherapy agents: [place]  To help prevent nausea and vomiting after your treatment, we encourage you to take your nausea medication as directed.    If you develop nausea and vomiting that is not controlled by your nausea medication, call the clinic.   BELOW ARE SYMPTOMS THAT SHOULD BE REPORTED IMMEDIATELY:  *FEVER GREATER THAN 100.5 F  *CHILLS WITH OR WITHOUT FEVER  NAUSEA AND VOMITING THAT IS NOT CONTROLLED WITH YOUR NAUSEA MEDICATION  *UNUSUAL SHORTNESS OF BREATH  *UNUSUAL BRUISING OR BLEEDING  TENDERNESS IN MOUTH AND THROAT WITH OR WITHOUT PRESENCE OF ULCERS  *URINARY PROBLEMS  *BOWEL PROBLEMS  UNUSUAL RASH Items with * indicate a potential emergency and should be followed up as soon as possible.  Feel free to call the clinic you have any questions or concerns. The clinic phone number is [phone].  Please show the CHEMO ALERT CARD at check-in to the Emergency Department and triage nurse.   

## 2016-06-24 LAB — CANCER ANTIGEN 27.29: CAN 27.29: 263.9 U/mL — AB (ref 0.0–38.6)

## 2016-07-06 ENCOUNTER — Telehealth: Payer: Self-pay | Admitting: Internal Medicine

## 2016-07-06 ENCOUNTER — Other Ambulatory Visit: Payer: Self-pay

## 2016-07-06 DIAGNOSIS — Z170 Estrogen receptor positive status [ER+]: Principal | ICD-10-CM

## 2016-07-06 DIAGNOSIS — C50512 Malignant neoplasm of lower-outer quadrant of left female breast: Secondary | ICD-10-CM

## 2016-07-06 DIAGNOSIS — H5789 Other specified disorders of eye and adnexa: Secondary | ICD-10-CM

## 2016-07-06 NOTE — Telephone Encounter (Signed)
Patient called to request a referral to   Dr. Jalene [facility] [place], [address], [place] [address] [phone]  For a persistently running eye

## 2016-07-07 ENCOUNTER — Ambulatory Visit: Payer: Medicare Other

## 2016-07-07 ENCOUNTER — Other Ambulatory Visit (HOSPITAL_BASED_OUTPATIENT_CLINIC_OR_DEPARTMENT_OTHER): Payer: Medicare Other

## 2016-07-07 ENCOUNTER — Ambulatory Visit (HOSPITAL_BASED_OUTPATIENT_CLINIC_OR_DEPARTMENT_OTHER): Payer: Medicare Other

## 2016-07-07 ENCOUNTER — Ambulatory Visit (HOSPITAL_BASED_OUTPATIENT_CLINIC_OR_DEPARTMENT_OTHER): Payer: Medicare Other | Admitting: Oncology

## 2016-07-07 VITALS — BP 111/56 | HR 85 | Temp 98.6°F | Resp 18 | Ht 60.0 in | Wt 149.6 lb

## 2016-07-07 DIAGNOSIS — Z170 Estrogen receptor positive status [ER+]: Secondary | ICD-10-CM

## 2016-07-07 DIAGNOSIS — C50512 Malignant neoplasm of lower-outer quadrant of left female breast: Secondary | ICD-10-CM

## 2016-07-07 DIAGNOSIS — C787 Secondary malignant neoplasm of liver and intrahepatic bile duct: Secondary | ICD-10-CM | POA: Diagnosis not present

## 2016-07-07 DIAGNOSIS — Z95828 Presence of other vascular implants and grafts: Secondary | ICD-10-CM

## 2016-07-07 DIAGNOSIS — Z5189 Encounter for other specified aftercare: Secondary | ICD-10-CM | POA: Diagnosis not present

## 2016-07-07 DIAGNOSIS — C7951 Secondary malignant neoplasm of bone: Secondary | ICD-10-CM

## 2016-07-07 DIAGNOSIS — I890 Lymphedema, not elsewhere classified: Secondary | ICD-10-CM

## 2016-07-07 LAB — CBC WITH DIFFERENTIAL/PLATELET
BASO%: 0.6 % (ref 0.0–2.0)
Basophils Absolute: 0.1 10*3/uL (ref 0.0–0.1)
EOS%: 0.2 % (ref 0.0–7.0)
Eosinophils Absolute: 0 10*3/uL (ref 0.0–0.5)
HEMATOCRIT: 34.6 % — AB (ref 34.8–46.6)
HEMOGLOBIN: 11.3 g/dL — AB (ref 11.6–15.9)
LYMPH#: 1.6 10*3/uL (ref 0.9–3.3)
LYMPH%: 15.4 % (ref 14.0–49.7)
MCH: 28.8 pg (ref 25.1–34.0)
MCHC: 32.7 g/dL (ref 31.5–36.0)
MCV: 88 fL (ref 79.5–101.0)
MONO#: 1 10*3/uL — ABNORMAL HIGH (ref 0.1–0.9)
MONO%: 9.1 % (ref 0.0–14.0)
NEUT#: 8 10*3/uL — ABNORMAL HIGH (ref 1.5–6.5)
NEUT%: 74.7 % (ref 38.4–76.8)
Platelets: 153 10*3/uL (ref 145–400)
RBC: 3.93 10*6/uL (ref 3.70–5.45)
RDW: 20.9 % — AB (ref 11.2–14.5)
WBC: 10.7 10*3/uL — AB (ref 3.9–10.3)

## 2016-07-07 LAB — COMPREHENSIVE METABOLIC PANEL
ALBUMIN: 3.1 g/dL — AB (ref 3.5–5.0)
ALK PHOS: 144 U/L (ref 40–150)
ALT: 7 U/L (ref 0–55)
ANION GAP: 11 meq/L (ref 3–11)
AST: 19 U/L (ref 5–34)
BILIRUBIN TOTAL: 0.77 mg/dL (ref 0.20–1.20)
BUN: 7.7 mg/dL (ref 7.0–26.0)
CO2: 27 mEq/L (ref 22–29)
Calcium: 9.3 mg/dL (ref 8.4–10.4)
Chloride: 103 mEq/L (ref 98–109)
Creatinine: 0.6 mg/dL (ref 0.6–1.1)
Glucose: 94 mg/dl (ref 70–140)
Potassium: 3.3 mEq/L — ABNORMAL LOW (ref 3.5–5.1)
Sodium: 141 mEq/L (ref 136–145)
TOTAL PROTEIN: 7 g/dL (ref 6.4–8.3)

## 2016-07-07 MED ORDER — SODIUM CHLORIDE 0.9 % IV SOLN
Freq: Once | INTRAVENOUS | Status: AC
  Administered 2016-07-07: 14:00:00 via INTRAVENOUS

## 2016-07-07 MED ORDER — SODIUM CHLORIDE 0.9 % IJ SOLN
10.0000 mL | INTRAMUSCULAR | Status: DC | PRN
  Administered 2016-07-07: 10 mL via INTRAVENOUS
  Filled 2016-07-07: qty 10

## 2016-07-07 MED ORDER — HEPARIN SOD (PORK) LOCK FLUSH 100 UNIT/ML IV SOLN
500.0000 [IU] | Freq: Once | INTRAVENOUS | Status: AC | PRN
  Administered 2016-07-07: 500 [IU]
  Filled 2016-07-07: qty 5

## 2016-07-07 MED ORDER — SODIUM CHLORIDE 0.9 % IV SOLN
1.4200 mg/m2 | Freq: Once | INTRAVENOUS | Status: AC
  Administered 2016-07-07: 2.5 mg via INTRAVENOUS
  Filled 2016-07-07: qty 5

## 2016-07-07 MED ORDER — PEGFILGRASTIM 6 MG/0.6ML ~~LOC~~ PSKT
6.0000 mg | PREFILLED_SYRINGE | Freq: Once | SUBCUTANEOUS | Status: AC
  Administered 2016-07-07: 6 mg via SUBCUTANEOUS
  Filled 2016-07-07: qty 0.6

## 2016-07-07 MED ORDER — PROCHLORPERAZINE MALEATE 10 MG PO TABS
10.0000 mg | ORAL_TABLET | Freq: Once | ORAL | Status: AC
  Administered 2016-07-07: 10 mg via ORAL

## 2016-07-07 MED ORDER — SODIUM CHLORIDE 0.9% FLUSH
10.0000 mL | INTRAVENOUS | Status: DC | PRN
  Administered 2016-07-07: 10 mL
  Filled 2016-07-07: qty 10

## 2016-07-07 MED ORDER — PROCHLORPERAZINE MALEATE 10 MG PO TABS
ORAL_TABLET | ORAL | Status: AC
  Filled 2016-07-07: qty 1

## 2016-07-07 MED ORDER — SODIUM CHLORIDE 0.9 % IV SOLN
Freq: Once | INTRAVENOUS | Status: AC
  Administered 2016-07-07: 14:00:00 via INTRAVENOUS
  Filled 2016-07-07: qty 250

## 2016-07-07 NOTE — Telephone Encounter (Signed)
REFERRAL ENTERED

## 2016-07-07 NOTE — Progress Notes (Signed)
ID: Christy Harrell   DOB: 1940/11/09  MR#: [id number]  YFV#:[id number]  PCP: [name], MD GYN:  SU:  OTHER MD:  Alysia Penna, Frederik Pear, Mellody Life DDS  CC: Breast cancer stage IV  TREATMENT: eribulin, [denosumab/Xgeva]  INTERVAL HISTORY: Clemma returns today for follow up of her metastatic breast cancer. Today is day 1 cycle 5 of eribulin, which she receives currently every 14 days with Onpro support  REVIEW OF SYSTEMS: Braeley continues to tolerate the eribulin generally well and much better than she did the prior chemotherapy, she tells me. It does make her feel fatigued. Sometimes she feels cold. She has a little bit of a runny nose and sometimes she has a little bit of tearing. She continues to have problems with her teeth, has not yet seen her dentist, and needs to have more teeth pulled she says. She has had itchy rash in her lower legs at times. She has arthritis pain in many joints but this is not new or more persistent or intense than prior. She tells me her blood sugars are well controlled. A detailed review of systems today was otherwise stable.    BREAST CANCER HISTORY: From the original intake note:  The patient had routine screening mammography 10/16/2010 showing a lobulated mass in the right subareolar region measuring up to 5.5 cm. The Left breast showed some central microcalcifications. Biopsy of the Right breast mass on 10/28/2010 showed an invasive ductal carcinoma, grade 2, estrogen receptor positive (Allred score 8), progesterone receptor positive (Allred score 5), with an equivocal HER-2. The Left breast area of microcalcifications was biopsied at the same time, and was read as suspicious for DCIS.  On 11/17/2010 the patient underwent Right lumpectomy and axillary lymph node dissection for what proved to be a 3 cm invasive ductal carcinoma, grade 2, with some papillary and mucinous features. One  of 16 lymph nodes was involved. FISH showed no HER-2 amplification. Left breast biopsy was benign.  The patient had an Oncotype sent, with a score of 27, predicting a risk of distant recurrence after 5 years of tamoxifen in the 18% range. With this intermediate result, the decision was made not to proceed with chemotherapy, since the patient is the sole caregiver to her very elderly mother. Instead the patient proceeded to radiation treatment which was completed 03/06/2011. She started anastrozole at that point. Her subsequent history is as detailed below   PAST MEDICAL HISTORY: Past Medical History:  Diagnosis Date  . Arthritis   . Cancer ([place])    right breast  . Club foot   . Diabetes mellitus    metformin  . GERD (gastroesophageal reflux disease)    watches diet  . History of radiation therapy 02/22/13- 03/13/13   left proximal humerus 3500 cGy 14 sessions  . Hyperlipidemia   . Hypertension   . Lymphedema    right arm  . Metastasis from malignant tumor of breast ([place])    left humerous  . Metastasis from malignant tumor of breast ([place])    liver  . Neuromuscular disorder ([facility])    lt arm numb sometimes  . Osteoporosis   . Thyroid disease   . Wears glasses     PAST SURGICAL HISTORY: Past Surgical History:  Procedure Laterality Date  . BREAST SURGERY  2012   rt lump-16 nodes-in [place]  . CESAREAN SECTION    . CESAREAN SECTION    . COLONOSCOPY    . DILATION AND  CURETTAGE OF UTERUS    . FOOT SURGERY     as a child for club foot  . HUMERUS IM NAIL Left 12/27/2012   Procedure: INTRAMEDULLARY (IM) NAIL HUMERAL LEFT PATHOLOGIC FRACTURE;  Surgeon: Nita Sells, MD;  Location: [facility];  Service: Orthopedics;  Laterality: Left;  . THYROIDECTOMY  2002    FAMILY HISTORY Family History  Problem Relation Age of Onset  . Cancer Neg Hx   . Arthritis Neg Hx   . Heart disease Neg Hx   . Hyperlipidemia Neg Hx   . Hypertension Neg Hx   . Dementia Mother   .  Hearing loss Mother    The patient's father died from unknown causes. The patient's mother is alive at age 42. The patient was a single child. There is no history of breast or ovarian cancer in the family to her knowledge.  GYNECOLOGIC HISTORY: Menarche age 66, menopause in her early 72s. The patient is GX P1, first pregnancy to term age 70. She did not take hormone replacement  SOCIAL HISTORY: The patient grew up in [place], attended [employer], then moved to [place] where she lived about 40 years. 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 Her mother, [name] away at 100, on August 31, 2014. The patient's daughter [name], 25, is disabled secondary to pulmonary hypertension. She can be reached at [phone]. The patient has 2 grandchildren, [name] who works at a Scientist, physiological business and is 75 years old, in general, 37, completing high school.   ADVANCED DIRECTIVES:not in place  HEALTH MAINTENANCE: Social History  Substance Use Topics  . Smoking status: Former Smoker    Quit date: 12/23/1974  . Smokeless tobacco: Never Used  . Alcohol use No     Colonoscopy:  PAP:    Bone density:  2012, on ibandronate chronically  Lipid panel: UTD  Allergies  Allergen Reactions  . Bydureon [Exenatide] Rash  . Gadolinium Derivatives Other (See Comments)    Pt began having chest numbness/tingling , stated her heart felt funny, had to take her to ED for EKG per RN   CAP  . Enalapril Rash    Current Outpatient Prescriptions  Medication Sig Dispense Refill  . amoxicillin (AMOXIL) 500 MG tablet Take 1 tablet (500 mg total) by mouth 3 (three) times daily. 15 tablet 0  . aspirin EC 81 MG tablet Take 1 tablet (81 mg total) by mouth daily.    Marland Kitchen atorvastatin (LIPITOR) 80 MG tablet Take 1 tablet (80 mg total) by mouth daily. 30 tablet 11  . docusate sodium (COLACE) 100 MG capsule Take 1 capsule (100 mg total) by mouth 2 (two) times daily as needed.  Reported on 10/17/2015 (Patient taking differently: Take 100 mg by mouth 2 (two) times daily as needed for mild constipation. Reported on 10/17/2015) 10 capsule 0  . doxepin (SINEQUAN) 25 MG capsule Take 2 capsules (50 mg total) by mouth at bedtime as needed. (Patient taking differently: Take 50 mg by mouth at bedtime as needed (sleep). ) 180 capsule 1  . furosemide (LASIX) 40 MG tablet Take 1 tablet (40 mg total) by mouth daily. 90 tablet 2  . levothyroxine (SYNTHROID) 175 MCG tablet Take 1 tablet (175 mcg total) by mouth daily before breakfast. 90 tablet 1  . lubiprostone (AMITIZA) 24 MCG capsule Take 1 capsule (24 mcg total) by mouth 2 (two) times daily with a meal. (Patient taking differently: Take 24 mcg by mouth  2 (two) times daily as needed for constipation. ) 180 capsule 1  . magic mouthwash SOLN Take 5 mLs by mouth 4 (four) times daily as needed for mouth pain. 240 mL 3  . metFORMIN (GLUCOPHAGE) 500 MG tablet Take 1 tablet (500 mg total) by mouth daily. 90 tablet 0  . oxyCODONE (OXY IR/ROXICODONE) 5 MG immediate release tablet Take 1-2 tablets (5-10 mg total) by mouth every 6 (six) hours as needed for severe pain. 100 tablet 0  . polyethylene glycol powder (GLYCOLAX/MIRALAX) powder Take 255 g (1 Container total) by mouth daily. (Patient taking differently: Take 1 Container by mouth daily as needed for mild constipation or moderate constipation. ) 255 g 3  . potassium chloride SA (K-DUR,KLOR-CON) 20 MEQ tablet Take 1 tablet (20 mEq total) by mouth 2 (two) times daily. 180 tablet 1  . pregabalin (LYRICA) 75 MG capsule Take 1 capsule (75 mg total) by mouth 2 (two) times daily. Not taking 60 capsule 5  . promethazine-dextromethorphan (PROMETHAZINE-DM) 6.25-15 MG/5ML syrup Take 5 mLs by mouth 4 (four) times daily as needed for cough. 118 mL 0   No current facility-administered medications for this visit.    PHYSICAL EXAM: Elderly African American woman Examined on the treatment chair   Vitals:    07/07/16 1308  BP: (!) 111/56  Pulse: 85  Resp: 18  Temp: 98.6 F (37 C)   Body mass index is 29.22 kg/m.    ECOG FS: 1 Filed Weights   07/07/16 1308  Weight: 149 lb 9.6 oz (67.9 kg)    Sclerae unicteric, EOMs intact Oropharynx clear, dentition in poor  No cervical or supraclavicular adenopathy Lungs no rales or rhonchi, auscultated anterolaterally  Heart regular rate and rhythm Abd soft,  obese, nontender, positive bowel sounds MSK  chronic right upper extremity lymphedema Unchanged from baseline  Neuro: nonfocal, well oriented,positive  affect Breasts: Deferred   LAB RESULTS: Lab Results  Component Value Date   WBC 10.7 (H) 07/07/2016   NEUTROABS 8.0 (H) 07/07/2016   HGB 11.3 (L) 07/07/2016   HCT 34.6 (L) 07/07/2016   MCV 88.0 07/07/2016   PLT 153 07/07/2016      Chemistry      Component Value Date/Time   NA 141 07/07/2016 1149   K 3.3 (L) 07/07/2016 1149   CL 107 02/21/2016 0410   CL 105 01/23/2013 1337   CO2 27 07/07/2016 1149   BUN 7.7 07/07/2016 1149   CREATININE 0.6 07/07/2016 1149      Component Value Date/Time   CALCIUM 9.3 07/07/2016 1149   ALKPHOS 144 07/07/2016 1149   AST 19 07/07/2016 1149   ALT 7 07/07/2016 1149   BILITOT 0.77 07/07/2016 1149       Lab Results  Component Value Date   LABCA2 24 11/19/2011    STUDIES: No results found.   ASSESSMENT: 75 y.o.  [place] woman with stage IV breast cancer as of May 2014  (1)  status post right lower outer quadrant lumpectomy and axillary lymph node dissection 11/17/2010 for a pT2 pN1, stage IIB invasive ductal carcinoma, grade 2, estrogen and progesterone receptor positive, HER-2 negative,   (2) Oncotype DX recurrence score of 27 predicting a risk of distant recurrence of 18% with 5 years of tamoxifen (intermediate score);   (3)  status post radiation completed July of 2012,   (4) started anastrozole July 2012, discontinued June 2014 with evidence of metastatic spread to bone  (5)  Chronic lymphedema in the right upper extremity.  (  6) pathologic fracture of the left humerus along apparent lytic lesion, with no other bone lesions per bone scan 12/12/2012  (7) on 12/27/2012 underwent #1 intramedullary nail left pathologic proximal humerus fracture  #2 left shoulder rotator cuff repair  #3 left shoulder open bone biopsy with pathology showing metastatic breast adenocarcinoma, estrogen receptor 100% positive, progesterone receptor and HER-2 negative, with an MIB-1 of 26%. PET scan 01/13/2013 showed no other areas of disease   (8) status post 35 Gy to the left proximal humerus completed 03/13/2013(  (9) fulvestrant, started on 01/23/2013, stopped May 2017 with progression  (10) zolendronic acid given monthly, first dose 01/23/2013; changed to denosumab as of August 2015 because of access problems, being given every three months  (a) May 2017 dose held after several extractions--on hold pending definitive dental clearance  (11) multiple liver lesions noted on scans April 2015   (12) letrozole added May 2015, stopped 04/18/14 because of progression in her liver noted on a chest CT performed on 03/14/14  (13) everolimus and exemestane started 04/19/14, stopped May 2017  (a) MRI of the abdomen 07/12/15 showed stable disease  (b) repeat abdominal MRI 12/04/2015 showed progression  (14) started CMF chemotherapy (cyclophosphamide, methotrexate, fluorouracil) 12/24/2015  (a) admitted 02/18/2016 with febrile neutropenia, negative cultures  (b) axillofacial CT scan 02/19/2016 shows no soft-tissue abscessbut lucencies in the roots of multiple upper and lower teeth may indicate periapical abscesses  (c) MRI of the liver 02/26/2016 shows disease progression: CMF discontinued  (15) started eribulin 03/31/2016, to be repeated days 1 and 8 of each cycle  (a) cycle 3 delayed one week because of malaise  (b) treatments switched to every 14 days beginning with cycle 4   PLAN: Kyilee  is now 3-1/2 years out from definitive diagnosis of metastatic breast cancer. She is currently receiving eribulin every 14 days and will receive a dose today and 2 weeks from now.  After that, we are going to omit the post chemotherapy dose at her request. Instead she will be restaged with a repeat liver MRI around that time.  She will then return to see me the first week in January. If she shows at least evidence of disease control we will continue the eribulin. Otherwise we will consider ixabe[pilone, Doxil, Navelbine, or Abraxane.   She has a good understanding of this plan. She knows to call for any problems that may develop before her next visit here.  Chauncey Cruel, MD  07/07/16

## 2016-07-07 NOTE — Progress Notes (Signed)
Potassium 3.3 today. Give 10 mEq potassium today with chemotherapy per Dr. [name]. Patient verbalized understanding.

## 2016-07-07 NOTE — Patient Instructions (Addendum)
[facility] Discharge Instructions for Patients Receiving Chemotherapy  Today you received the following chemotherapy agents [place] To help prevent nausea and vomiting after your treatment, we encourage you to take your nausea medication as prescribed.   If you develop nausea and vomiting that is not controlled by your nausea medication, call the clinic.   BELOW ARE SYMPTOMS THAT SHOULD BE REPORTED IMMEDIATELY:  *FEVER GREATER THAN 100.5 F  *CHILLS WITH OR WITHOUT FEVER  NAUSEA AND VOMITING THAT IS NOT CONTROLLED WITH YOUR NAUSEA MEDICATION  *UNUSUAL SHORTNESS OF BREATH  *UNUSUAL BRUISING OR BLEEDING  TENDERNESS IN MOUTH AND THROAT WITH OR WITHOUT PRESENCE OF ULCERS  *URINARY PROBLEMS  *BOWEL PROBLEMS  UNUSUAL RASH Items with * indicate a potential emergency and should be followed up as soon as possible.  Feel free to call the clinic you have any questions or concerns. The clinic phone number is [phone].  Please show the [facility] at check-in to the Emergency Department and triage nurse.   Hypokalemia Hypokalemia means that the amount of potassium in the blood is lower than normal.Potassium is a chemical that helps regulate the amount of fluid in the body (electrolyte). It also stimulates muscle tightening (contraction) and helps nerves work properly.Normally, most of the body's potassium is inside of cells, and only a very small amount is in the blood. Because the amount in the blood is so small, minor changes to potassium levels in the blood can be life-threatening. What are the causes? This condition may be caused by:  Antibiotic medicine.  Diarrhea or vomiting. Taking too much of a medicine that helps you have a bowel movement (laxative) can cause diarrhea and lead to hypokalemia.  Chronic kidney disease (CKD).  Medicines that help the body get rid of excess fluid (diuretics).  Eating disorders, such as bulimia.  Low magnesium levels  in the body.  Sweating a lot. What are the signs or symptoms? Symptoms of this condition include:  Weakness.  Constipation.  Fatigue.  Muscle cramps.  Mental confusion.  Skipped heartbeats or irregular heartbeat (palpitations).  Tingling or numbness. How is this diagnosed? This condition is diagnosed with a blood test. How is this treated? Hypokalemia can be treated by taking potassium supplements by mouth or adjusting the medicines that you take. Treatment may also include eating more foods that contain a lot of potassium. If your potassium level is very low, you may need to get potassium through an IV tube in one of your veins and be monitored in the hospital. Follow these instructions at home:  Take over-the-counter and prescription medicines only as told by your health care provider. This includes vitamins and supplements.  Eat a healthy diet. A healthy diet includes fresh fruits and vegetables, whole grains, healthy fats, and lean proteins.  If instructed, eat more foods that contain a lot of potassium, such as:  Nuts, such as peanuts and pistachios.  Seeds, such as sunflower seeds and pumpkin seeds.  Peas, lentils, and lima beans.  Whole grain and bran cereals and breads.  Fresh fruits and vegetables, such as apricots, avocado, bananas, cantaloupe, kiwi, oranges, tomatoes, asparagus, and potatoes.  Orange juice.  Tomato juice.  Red meats.  Yogurt.  Keep all follow-up visits as told by your health care provider. This is important. Contact a health care provider if:  You have weakness that gets worse.  You feel your heart pounding or racing.  You vomit.  You have diarrhea.  You have diabetes (diabetes  mellitus) and you have trouble keeping your blood sugar (glucose) in your target range. Get help right away if:  You have chest pain.  You have shortness of breath.  You have vomiting or diarrhea that lasts for more than 2 days.  You faint. This  information is not intended to replace advice given to you by your health care provider. Make sure you discuss any questions you have with your health care provider. Document Released: 07/27/2005 Document Revised: 03/14/2016 Document Reviewed: 03/14/2016 Elsevier Interactive Patient Education  2017 [employer].

## 2016-07-10 ENCOUNTER — Encounter: Payer: Self-pay | Admitting: Oncology

## 2016-07-10 NOTE — Progress Notes (Signed)
Received call from Mardene Celeste w/[employer] with PA approval for Hal[place].  [id number] PA [id number] Approved 07/07/16-08/09/16  She also states [employer] coverage for patient terms 08/09/16.

## 2016-07-10 NOTE — Progress Notes (Signed)
Received a Advertising account executive from [place] for [employer].    Neulasta auth approved 07/07/16-08/09/16  [id number]  Forwarded to [email or web address]  via email.

## 2016-07-21 ENCOUNTER — Ambulatory Visit: Payer: Medicare Other

## 2016-07-21 ENCOUNTER — Other Ambulatory Visit: Payer: Medicare Other

## 2016-07-23 ENCOUNTER — Other Ambulatory Visit: Payer: Self-pay | Admitting: *Deleted

## 2016-07-23 ENCOUNTER — Ambulatory Visit (HOSPITAL_BASED_OUTPATIENT_CLINIC_OR_DEPARTMENT_OTHER): Payer: Medicare Other

## 2016-07-23 ENCOUNTER — Other Ambulatory Visit (HOSPITAL_BASED_OUTPATIENT_CLINIC_OR_DEPARTMENT_OTHER): Payer: Medicare Other

## 2016-07-23 VITALS — BP 116/42 | HR 59 | Temp 98.5°F | Resp 18

## 2016-07-23 DIAGNOSIS — M5412 Radiculopathy, cervical region: Secondary | ICD-10-CM

## 2016-07-23 DIAGNOSIS — M4802 Spinal stenosis, cervical region: Secondary | ICD-10-CM

## 2016-07-23 DIAGNOSIS — C50512 Malignant neoplasm of lower-outer quadrant of left female breast: Secondary | ICD-10-CM | POA: Diagnosis not present

## 2016-07-23 DIAGNOSIS — Z170 Estrogen receptor positive status [ER+]: Principal | ICD-10-CM

## 2016-07-23 DIAGNOSIS — E876 Hypokalemia: Secondary | ICD-10-CM

## 2016-07-23 DIAGNOSIS — G893 Neoplasm related pain (acute) (chronic): Secondary | ICD-10-CM

## 2016-07-23 DIAGNOSIS — Z5189 Encounter for other specified aftercare: Secondary | ICD-10-CM | POA: Diagnosis not present

## 2016-07-23 DIAGNOSIS — C7951 Secondary malignant neoplasm of bone: Secondary | ICD-10-CM | POA: Diagnosis not present

## 2016-07-23 DIAGNOSIS — C787 Secondary malignant neoplasm of liver and intrahepatic bile duct: Secondary | ICD-10-CM

## 2016-07-23 DIAGNOSIS — M4692 Unspecified inflammatory spondylopathy, cervical region: Secondary | ICD-10-CM

## 2016-07-23 DIAGNOSIS — M159 Polyosteoarthritis, unspecified: Secondary | ICD-10-CM

## 2016-07-23 LAB — CBC WITH DIFFERENTIAL/PLATELET
BASO%: 0.2 % (ref 0.0–2.0)
BASOS ABS: 0 10*3/uL (ref 0.0–0.1)
EOS ABS: 0 10*3/uL (ref 0.0–0.5)
EOS%: 0 % (ref 0.0–7.0)
HCT: 35.6 % (ref 34.8–46.6)
HGB: 11.7 g/dL (ref 11.6–15.9)
LYMPH%: 16.3 % (ref 14.0–49.7)
MCH: 29 pg (ref 25.1–34.0)
MCHC: 32.9 g/dL (ref 31.5–36.0)
MCV: 88.3 fL (ref 79.5–101.0)
MONO#: 0.8 10*3/uL (ref 0.1–0.9)
MONO%: 7.1 % (ref 0.0–14.0)
NEUT#: 8.8 10*3/uL — ABNORMAL HIGH (ref 1.5–6.5)
NEUT%: 76.4 % (ref 38.4–76.8)
PLATELETS: 182 10*3/uL (ref 145–400)
RBC: 4.03 10*6/uL (ref 3.70–5.45)
RDW: 20 % — ABNORMAL HIGH (ref 11.2–14.5)
WBC: 11.6 10*3/uL — ABNORMAL HIGH (ref 3.9–10.3)
lymph#: 1.9 10*3/uL (ref 0.9–3.3)

## 2016-07-23 LAB — COMPREHENSIVE METABOLIC PANEL
ALBUMIN: 3.1 g/dL — AB (ref 3.5–5.0)
ALK PHOS: 139 U/L (ref 40–150)
ALT: 8 U/L (ref 0–55)
ANION GAP: 12 meq/L — AB (ref 3–11)
AST: 22 U/L (ref 5–34)
BILIRUBIN TOTAL: 1.1 mg/dL (ref 0.20–1.20)
BUN: 11.8 mg/dL (ref 7.0–26.0)
CO2: 26 meq/L (ref 22–29)
Calcium: 9.1 mg/dL (ref 8.4–10.4)
Chloride: 101 mEq/L (ref 98–109)
Creatinine: 0.7 mg/dL (ref 0.6–1.1)
GLUCOSE: 82 mg/dL (ref 70–140)
POTASSIUM: 3 meq/L — AB (ref 3.5–5.1)
SODIUM: 139 meq/L (ref 136–145)
TOTAL PROTEIN: 6.9 g/dL (ref 6.4–8.3)

## 2016-07-23 MED ORDER — PROCHLORPERAZINE MALEATE 10 MG PO TABS
ORAL_TABLET | ORAL | Status: AC
  Filled 2016-07-23: qty 1

## 2016-07-23 MED ORDER — PROCHLORPERAZINE MALEATE 10 MG PO TABS
10.0000 mg | ORAL_TABLET | Freq: Once | ORAL | Status: AC
  Administered 2016-07-23: 10 mg via ORAL

## 2016-07-23 MED ORDER — POTASSIUM CHLORIDE ER 10 MEQ PO CPCR: 10.0000 meq | ORAL_CAPSULE | Freq: Two times a day (BID) | ORAL | 1 refills | Status: AC

## 2016-07-23 MED ORDER — SODIUM CHLORIDE 0.9 % IV SOLN
1.4400 mg/m2 | Freq: Once | INTRAVENOUS | Status: AC
  Administered 2016-07-23: 2.5 mg via INTRAVENOUS
  Filled 2016-07-23: qty 5

## 2016-07-23 MED ORDER — OXYCODONE HCL 5 MG PO TABS: 5.0000 mg | ORAL_TABLET | Freq: Four times a day (QID) | ORAL | 0 refills | Status: DC | PRN

## 2016-07-23 MED ORDER — SODIUM CHLORIDE 0.9 % IV SOLN
Freq: Once | INTRAVENOUS | Status: AC
  Administered 2016-07-23: 14:00:00 via INTRAVENOUS

## 2016-07-23 MED ORDER — PEGFILGRASTIM 6 MG/0.6ML ~~LOC~~ PSKT
6.0000 mg | PREFILLED_SYRINGE | Freq: Once | SUBCUTANEOUS | Status: AC
  Administered 2016-07-23: 6 mg via SUBCUTANEOUS
  Filled 2016-07-23: qty 0.6

## 2016-07-23 MED ORDER — SODIUM CHLORIDE 0.9% FLUSH
10.0000 mL | INTRAVENOUS | Status: AC | PRN
  Administered 2016-07-23: 10 mL
  Filled 2016-07-23: qty 10

## 2016-07-23 MED ORDER — HEPARIN SOD (PORK) LOCK FLUSH 100 UNIT/ML IV SOLN
500.0000 [IU] | Freq: Once | INTRAVENOUS | Status: AC | PRN
  Administered 2016-07-23: 500 [IU]
  Filled 2016-07-23: qty 5

## 2016-07-23 MED ORDER — POTASSIUM CHLORIDE CRYS ER 20 MEQ PO TBCR
40.0000 meq | EXTENDED_RELEASE_TABLET | Freq: Once | ORAL | Status: AC
  Administered 2016-07-23: 40 meq via ORAL
  Filled 2016-07-23: qty 2

## 2016-07-28 ENCOUNTER — Encounter: Payer: Self-pay | Admitting: *Deleted

## 2016-08-04 ENCOUNTER — Other Ambulatory Visit: Payer: Medicare Other

## 2016-08-04 ENCOUNTER — Ambulatory Visit: Payer: Medicare Other

## 2016-08-07 ENCOUNTER — Other Ambulatory Visit: Payer: Self-pay | Admitting: *Deleted

## 2016-08-07 DIAGNOSIS — C7951 Secondary malignant neoplasm of bone: Secondary | ICD-10-CM

## 2016-08-07 DIAGNOSIS — C50512 Malignant neoplasm of lower-outer quadrant of left female breast: Secondary | ICD-10-CM

## 2016-08-07 DIAGNOSIS — Z170 Estrogen receptor positive status [ER+]: Principal | ICD-10-CM

## 2016-08-07 DIAGNOSIS — C787 Secondary malignant neoplasm of liver and intrahepatic bile duct: Secondary | ICD-10-CM

## 2016-08-11 ENCOUNTER — Telehealth: Payer: Self-pay | Admitting: *Deleted

## 2016-08-11 NOTE — Telephone Encounter (Signed)
VM left by Dr Purvis Kilts ( dental ) per update on dental concerns.  Pt had 2 teeth extracted today - noted increase with necrotic bone - pathology obtained and sent to [facility] for pathology.  If needed MD can contact Dr Carollee Herter at [phone].  Pt is scheduled to see Dr Jannifer Rodney on 08/13/2016.  This note will be given to MD.

## 2016-08-12 ENCOUNTER — Telehealth: Payer: Self-pay | Admitting: Oncology

## 2016-08-12 ENCOUNTER — Telehealth: Payer: Self-pay | Admitting: *Deleted

## 2016-08-12 NOTE — Telephone Encounter (Signed)
sw pt to confirm r/s appt to 1/12 per LoS

## 2016-08-12 NOTE — Telephone Encounter (Signed)
This RN returned call to pt per her call to [facility] stating she needs to cancel appointments for tomorrow due to " I had surgery on my mouth "  Note pt had dental extractions on 08/12/2015.  Per contact this RN verified with pt appointments will be canceled and rescheduled to post MRI of liver which she needs to proceed with .  Pt has no other needs at this time and understands to call if concerns arise.

## 2016-08-13 ENCOUNTER — Other Ambulatory Visit: Payer: Medicare Other

## 2016-08-13 ENCOUNTER — Ambulatory Visit: Payer: Medicare Other | Admitting: Oncology

## 2016-08-13 ENCOUNTER — Ambulatory Visit: Payer: Medicare Other

## 2016-08-17 ENCOUNTER — Other Ambulatory Visit: Payer: Self-pay | Admitting: Oncology

## 2016-08-17 NOTE — Progress Notes (Unsigned)
This alone yet had 2 teeth extracted 08/11/2016. There was increase in necrotic bone. Pathology was sent to [facility] and is pending  She sees me again January 12. We will discuss thisin detail at that time.

## 2016-08-18 ENCOUNTER — Other Ambulatory Visit: Payer: Medicare Other

## 2016-08-18 ENCOUNTER — Ambulatory Visit: Payer: Medicare Other | Admitting: Oncology

## 2016-08-18 ENCOUNTER — Ambulatory Visit: Payer: Medicare Other

## 2016-08-18 LAB — HM DIABETES EYE EXAM

## 2016-08-19 ENCOUNTER — Telehealth: Payer: Self-pay | Admitting: Oncology

## 2016-08-19 NOTE — Telephone Encounter (Signed)
Pt called to [facility] 1/12 appt due to lack of transportation. Pt will call back to r/s once transportation is obtained

## 2016-08-20 ENCOUNTER — Ambulatory Visit (HOSPITAL_COMMUNITY)
Admission: RE | Admit: 2016-08-20 | Discharge: 2016-08-20 | Disposition: A | Discharge status: Home / Self Care | Payer: Medicare Other | Source: Ambulatory Visit | Attending: Oncology | Admitting: Oncology

## 2016-08-20 DIAGNOSIS — Z170 Estrogen receptor positive status [ER+]: Secondary | ICD-10-CM | POA: Insufficient documentation

## 2016-08-20 DIAGNOSIS — C787 Secondary malignant neoplasm of liver and intrahepatic bile duct: Secondary | ICD-10-CM | POA: Diagnosis not present

## 2016-08-20 DIAGNOSIS — C50512 Malignant neoplasm of lower-outer quadrant of left female breast: Secondary | ICD-10-CM | POA: Diagnosis not present

## 2016-08-20 DIAGNOSIS — C7951 Secondary malignant neoplasm of bone: Secondary | ICD-10-CM | POA: Insufficient documentation

## 2016-08-20 DIAGNOSIS — M47896 Other spondylosis, lumbar region: Secondary | ICD-10-CM | POA: Diagnosis not present

## 2016-08-21 ENCOUNTER — Other Ambulatory Visit: Payer: Self-pay | Admitting: Oncology

## 2016-08-21 ENCOUNTER — Ambulatory Visit: Payer: Medicare Other | Admitting: Oncology

## 2016-08-21 ENCOUNTER — Other Ambulatory Visit: Payer: Medicare Other

## 2016-08-21 NOTE — Progress Notes (Unsigned)
Dr Romie Minus call me with the results of his bone biopsy from this alone he is jaw, which was read as osteonecrosis, no cancer noted, no evidence of infection.  I reviewed her records. Of course she has alendronate for quite a while and then she went on denosumab, with the last denosumab treatment 09/19/2015  She is scheduled to return to see me within the next month. We will pick up on this question at that time.

## 2016-08-27 ENCOUNTER — Telehealth: Payer: Self-pay | Admitting: *Deleted

## 2016-08-27 NOTE — Telephone Encounter (Signed)
This RN received call from pt's daughterAnderson Malta in [place].  [name] states she has been attempts to call her mother with no answer " so I called her several times and she did not answer " Celeste then called Thess's neighbors who have a key to her home - they went in and found pt on the toilet unable to stand up.  Pt was assisted off the toilet with noted weakness.  911 was contacted.  Celeste then states " I wanted them to take her to the hospital but then when the paramedics arrived my mother refused to go to the ER "  [name] states she was informed by the paramedics that pt's vital signs and blood sugar was normal except for BP was on low side.  Neighbor was assisting with preparing soup for the pt.  [name] stated concerns with mother due to above as well as pt not keeping her cordless phones charged so she can keep it with her when she goes into other rooms. Nor is pt keeping her cell phone readily available for emergency situations such as above.  " I really want mom to come back her and resume her care at [facility] but mom states she needs to continue her care there "  This RN validated [name]' concerns as well as her mother's desire for independence.  Per above this note will be sent to our Support Services for possible benefit with home resources as well as to MD for communication of current situation.  Return call number given for [facility] [phone].

## 2016-08-28 ENCOUNTER — Telehealth: Payer: Self-pay | Admitting: *Deleted

## 2016-08-28 ENCOUNTER — Encounter: Payer: Self-pay | Admitting: *Deleted

## 2016-08-28 ENCOUNTER — Emergency Department (HOSPITAL_COMMUNITY): Payer: Medicare Other

## 2016-08-28 ENCOUNTER — Inpatient Hospital Stay (HOSPITAL_COMMUNITY)
Admission: EM | Admit: 2016-08-28 | Discharge: 2016-09-18 | DRG: 070 | Disposition: A | Discharge status: Skilled Nursing Facility | Payer: Medicare Other | Attending: Internal Medicine | Admitting: Internal Medicine

## 2016-08-28 ENCOUNTER — Encounter (HOSPITAL_COMMUNITY): Payer: Self-pay | Admitting: Emergency Medicine

## 2016-08-28 DIAGNOSIS — Z7984 Long term (current) use of oral hypoglycemic drugs: Secondary | ICD-10-CM

## 2016-08-28 DIAGNOSIS — C801 Malignant (primary) neoplasm, unspecified: Secondary | ICD-10-CM

## 2016-08-28 DIAGNOSIS — M87180 Osteonecrosis due to drugs, jaw: Secondary | ICD-10-CM | POA: Diagnosis present

## 2016-08-28 DIAGNOSIS — Z6822 Body mass index (BMI) 22.0-22.9, adult: Secondary | ICD-10-CM

## 2016-08-28 DIAGNOSIS — D61818 Other pancytopenia: Secondary | ICD-10-CM | POA: Diagnosis present

## 2016-08-28 DIAGNOSIS — E43 Unspecified severe protein-calorie malnutrition: Secondary | ICD-10-CM | POA: Diagnosis present

## 2016-08-28 DIAGNOSIS — R32 Unspecified urinary incontinence: Secondary | ICD-10-CM | POA: Diagnosis present

## 2016-08-28 DIAGNOSIS — M879 Osteonecrosis, unspecified: Secondary | ICD-10-CM | POA: Diagnosis present

## 2016-08-28 DIAGNOSIS — R404 Transient alteration of awareness: Secondary | ICD-10-CM | POA: Diagnosis not present

## 2016-08-28 DIAGNOSIS — Z7901 Long term (current) use of anticoagulants: Secondary | ICD-10-CM

## 2016-08-28 DIAGNOSIS — Z515 Encounter for palliative care: Secondary | ICD-10-CM | POA: Diagnosis present

## 2016-08-28 DIAGNOSIS — E876 Hypokalemia: Secondary | ICD-10-CM | POA: Diagnosis present

## 2016-08-28 DIAGNOSIS — I82402 Acute embolism and thrombosis of unspecified deep veins of left lower extremity: Secondary | ICD-10-CM

## 2016-08-28 DIAGNOSIS — I959 Hypotension, unspecified: Secondary | ICD-10-CM | POA: Diagnosis present

## 2016-08-28 DIAGNOSIS — R159 Full incontinence of feces: Secondary | ICD-10-CM | POA: Diagnosis present

## 2016-08-28 DIAGNOSIS — G9340 Encephalopathy, unspecified: Secondary | ICD-10-CM

## 2016-08-28 DIAGNOSIS — C7949 Secondary malignant neoplasm of other parts of nervous system: Secondary | ICD-10-CM

## 2016-08-28 DIAGNOSIS — F329 Major depressive disorder, single episode, unspecified: Secondary | ICD-10-CM | POA: Diagnosis present

## 2016-08-28 DIAGNOSIS — G9341 Metabolic encephalopathy: Secondary | ICD-10-CM | POA: Diagnosis not present

## 2016-08-28 DIAGNOSIS — Z7982 Long term (current) use of aspirin: Secondary | ICD-10-CM

## 2016-08-28 DIAGNOSIS — E785 Hyperlipidemia, unspecified: Secondary | ICD-10-CM | POA: Diagnosis present

## 2016-08-28 DIAGNOSIS — E860 Dehydration: Secondary | ICD-10-CM | POA: Diagnosis present

## 2016-08-28 DIAGNOSIS — IMO0002 Reserved for concepts with insufficient information to code with codable children: Secondary | ICD-10-CM

## 2016-08-28 DIAGNOSIS — E039 Hypothyroidism, unspecified: Secondary | ICD-10-CM | POA: Diagnosis present

## 2016-08-28 DIAGNOSIS — Z170 Estrogen receptor positive status [ER+]: Secondary | ICD-10-CM

## 2016-08-28 DIAGNOSIS — I890 Lymphedema, not elsewhere classified: Secondary | ICD-10-CM | POA: Diagnosis present

## 2016-08-28 DIAGNOSIS — D696 Thrombocytopenia, unspecified: Secondary | ICD-10-CM | POA: Diagnosis present

## 2016-08-28 DIAGNOSIS — Z7189 Other specified counseling: Secondary | ICD-10-CM

## 2016-08-28 DIAGNOSIS — Z888 Allergy status to other drugs, medicaments and biological substances status: Secondary | ICD-10-CM

## 2016-08-28 DIAGNOSIS — R627 Adult failure to thrive: Secondary | ICD-10-CM | POA: Diagnosis present

## 2016-08-28 DIAGNOSIS — C7951 Secondary malignant neoplasm of bone: Secondary | ICD-10-CM | POA: Diagnosis present

## 2016-08-28 DIAGNOSIS — R0902 Hypoxemia: Secondary | ICD-10-CM | POA: Diagnosis present

## 2016-08-28 DIAGNOSIS — C787 Secondary malignant neoplasm of liver and intrahepatic bile duct: Secondary | ICD-10-CM | POA: Diagnosis present

## 2016-08-28 DIAGNOSIS — Z923 Personal history of irradiation: Secondary | ICD-10-CM

## 2016-08-28 DIAGNOSIS — K219 Gastro-esophageal reflux disease without esophagitis: Secondary | ICD-10-CM | POA: Diagnosis present

## 2016-08-28 DIAGNOSIS — R1310 Dysphagia, unspecified: Secondary | ICD-10-CM | POA: Diagnosis present

## 2016-08-28 DIAGNOSIS — I82412 Acute embolism and thrombosis of left femoral vein: Secondary | ICD-10-CM | POA: Diagnosis present

## 2016-08-28 DIAGNOSIS — E1165 Type 2 diabetes mellitus with hyperglycemia: Secondary | ICD-10-CM | POA: Diagnosis present

## 2016-08-28 DIAGNOSIS — E1140 Type 2 diabetes mellitus with diabetic neuropathy, unspecified: Secondary | ICD-10-CM | POA: Diagnosis present

## 2016-08-28 DIAGNOSIS — R4182 Altered mental status, unspecified: Secondary | ICD-10-CM

## 2016-08-28 DIAGNOSIS — G893 Neoplasm related pain (acute) (chronic): Secondary | ICD-10-CM

## 2016-08-28 DIAGNOSIS — L8990 Pressure ulcer of unspecified site, unspecified stage: Secondary | ICD-10-CM | POA: Diagnosis present

## 2016-08-28 DIAGNOSIS — Z66 Do not resuscitate: Secondary | ICD-10-CM | POA: Diagnosis present

## 2016-08-28 DIAGNOSIS — I10 Essential (primary) hypertension: Secondary | ICD-10-CM | POA: Diagnosis present

## 2016-08-28 DIAGNOSIS — Z87891 Personal history of nicotine dependence: Secondary | ICD-10-CM

## 2016-08-28 DIAGNOSIS — C50512 Malignant neoplasm of lower-outer quadrant of left female breast: Secondary | ICD-10-CM | POA: Diagnosis present

## 2016-08-28 LAB — ETHANOL

## 2016-08-28 LAB — COMPREHENSIVE METABOLIC PANEL
ALT: 11 U/L — ABNORMAL LOW (ref 14–54)
AST: 23 U/L (ref 15–41)
Albumin: 3.5 g/dL (ref 3.5–5.0)
Alkaline Phosphatase: 72 U/L (ref 38–126)
Anion gap: 15 (ref 5–15)
BUN: 36 mg/dL — ABNORMAL HIGH (ref 6–20)
CHLORIDE: 103 mmol/L (ref 101–111)
CO2: 23 mmol/L (ref 22–32)
Calcium: 8.8 mg/dL — ABNORMAL LOW (ref 8.9–10.3)
Creatinine, Ser: 0.65 mg/dL (ref 0.44–1.00)
Glucose, Bld: 137 mg/dL — ABNORMAL HIGH (ref 65–99)
POTASSIUM: 2.6 mmol/L — AB (ref 3.5–5.1)
SODIUM: 141 mmol/L (ref 135–145)
Total Bilirubin: 2 mg/dL — ABNORMAL HIGH (ref 0.3–1.2)
Total Protein: 7.1 g/dL (ref 6.5–8.1)

## 2016-08-28 LAB — CBC
HEMATOCRIT: 36.8 % (ref 36.0–46.0)
HEMOGLOBIN: 12.4 g/dL (ref 12.0–15.0)
MCH: 30 pg (ref 26.0–34.0)
MCHC: 33.7 g/dL (ref 30.0–36.0)
MCV: 88.9 fL (ref 78.0–100.0)
Platelets: 113 10*3/uL — ABNORMAL LOW (ref 150–400)
RBC: 4.14 MIL/uL (ref 3.87–5.11)
RDW: 18.9 % — ABNORMAL HIGH (ref 11.5–15.5)
WBC: 5 10*3/uL (ref 4.0–10.5)

## 2016-08-28 LAB — CBG MONITORING, ED: Glucose-Capillary: 129 mg/dL — ABNORMAL HIGH (ref 65–99)

## 2016-08-28 LAB — AMMONIA: Ammonia: 26 umol/L (ref 9–35)

## 2016-08-28 MED ORDER — IOPAMIDOL (ISOVUE-300) INJECTION 61%
75.0000 mL | Freq: Once | INTRAVENOUS | Status: AC | PRN
  Administered 2016-08-28: 75 mL via INTRAVENOUS

## 2016-08-28 MED ORDER — IOPAMIDOL (ISOVUE-300) INJECTION 61%
INTRAVENOUS | Status: AC
  Filled 2016-08-28: qty 75

## 2016-08-28 MED ORDER — SODIUM CHLORIDE 0.9 % IV SOLN
Freq: Once | INTRAVENOUS | Status: AC
  Administered 2016-08-29: 04:00:00 via INTRAVENOUS
  Filled 2016-08-28 (×2): qty 1000

## 2016-08-28 MED ORDER — SODIUM CHLORIDE 0.9 % IV SOLN
30.0000 meq | Freq: Once | INTRAVENOUS | Status: AC
  Administered 2016-08-29: 30 meq via INTRAVENOUS
  Filled 2016-08-28: qty 15

## 2016-08-28 MED ORDER — POTASSIUM CHLORIDE CRYS ER 20 MEQ PO TBCR
40.0000 meq | EXTENDED_RELEASE_TABLET | Freq: Two times a day (BID) | ORAL | Status: DC
  Filled 2016-08-28: qty 2

## 2016-08-28 NOTE — Telephone Encounter (Signed)
This RN spoke with pt's daughter per her call stating mom has been found by neighbor " in same chair she was left in yesterday, now with noted urination on herself "  " the same bowl of soup that was fixed for her is sitting beside her untouched "  [name] is asking for the neighbor to contact the EMS for transport to the ER " but mom is probably won't want to go "  Per above this RN stated call will be made to pt with MD recommendation to proceed to the ER.  This RN called Thes and discussed above with her, noted some descripency with pt's understanding of current situation and concerns- she does not realize that she has urinated on herself or that she seems to be in the same chair all night.  This RN informed Thes of concern for underlying issue such as a UTI or low heme that may be causing severe weakness that she is unable to care for her self at this time which may get worse over the weekend. Informed pt per Dr Magrinat's concern - he would want her to proceed to the ER when EMS arrives.   Thes stated yes though with reluctance - during this conversation pt's neighbor Gerald Stabs arrived in the home and this RN was able to speak to her informing her to call EMS with understanding of request to be transported to the ER for further evaluation. Gerald Stabs verbalized understanding.  This RN then spoke with pt again per above with pt able to state she is in agreement of plan to proceed to the ER upon arrival of EMS.  This RN contacted [name] on her cell number of [phone] per above.

## 2016-08-28 NOTE — ED Notes (Signed)
Bed: WA21 Expected date:  Expected time:  Means of arrival:  Comments: 

## 2016-08-28 NOTE — ED Notes (Signed)
Patient transported to CT 

## 2016-08-28 NOTE — Progress Notes (Signed)
[facility] Work  Clinical Social Work received return call from pt's daughter with new concerns about pt. Per daughter's report, extended family went to check on pt today and they think she was in the exact same chair that they left her in yesterday when they checked on her. Daughter reports pt urinated on her self and would not let anyone clean her up. Daughter concerned as pt appeared to have slurred speech, but sure if that is due to recent dental work. CSW reached out to RN for advise, but feels pt probably needs to be evaluated by MD. Daughter also shared pt has appeared forgetful and had increasing difficulty with ADLs. CSW educated daughter on care resources, but that the biggest issue appears to be appropriate medical care.   Loren Racer, [place] Worker [place]  [place] Phone: [phone] Fax: [phone]

## 2016-08-28 NOTE — ED Notes (Signed)
RN accessing port 

## 2016-08-28 NOTE — ED Notes (Signed)
Pt refused in/out cath   

## 2016-08-28 NOTE — Progress Notes (Signed)
[facility] Work  Clinical Social Work was referred by nurse for possible care needs at home. Clinical Social Worker reviewed chart and problem solved with Therapist, sports. CSW recommends [facility] for PT/OT due to noted weakness and pt having difficulty getting up. CSW also made attempt to contact pt's daughter and left message describing possible resource options for support that should be covered through insurance. CSW encouraged daughter to return CSW call as needed.  Clinical Social Work interventions:  Resource assistance  Loren Racer, [place] [facility] Worker [place]  [place] Phone: [phone] Fax: [phone]

## 2016-08-28 NOTE — ED Provider Notes (Signed)
[facility] DEPT Provider Note   CSN: [id number] Arrival date & time: 08/28/16  1531     History   Chief Complaint Chief Complaint  Patient presents with  . Altered Mental Status    HPI Christy Harrell is a 76 y.o. female.  HPI  76 year old female with a history of metastatic breast cancer presents to the emergency department with altered mental status and lethargy. Patient reports that her daughter from [place] 1 her to come here because "she did not like the way I was walking." Otherwise patient without any physical complaints. I spoke to the daughter who reports that the patient has been gradually worsening over the past several months but most recently has had increased lethargy. She reports that the patient takes narcotic pain medicine for her metastatic disease. She also reports that patient was found by a family member yesterday and noted to be extremely lethargic. EMS was called who noted that the patient's blood pressure was low which improved with 500 mL of IV fluid bolus. At that time patient was not route to the emergency department. Family members made for him for the patient and placed her in a chair. When they return the following morning the patient had not moved and had not eaten hirsute. They also noted that the patient had defecated and urinated on herself.   Of note daughter also reports that the patient had recent oral surgery and biopsy if concerning for metastatic disease to the jaw which turned out to be negative. She reports that she is supposed to be on penicillin however she has not been taking that medicine.  Remainder of history, ROS, and physical exam limited due to patient's condition (AMS). Additional information was obtained from family.   Level V Caveat.    Past Medical History:  Diagnosis Date  . Arthritis   . Cancer ([place])    right breast  . Club foot   . Diabetes mellitus    metformin  . GERD (gastroesophageal reflux disease)    watches  diet  . History of radiation therapy 02/22/13- 03/13/13   left proximal humerus 3500 cGy 14 sessions  . Hyperlipidemia   . Hypertension   . Lymphedema    right arm  . Metastasis from malignant tumor of breast ([place])    left humerous  . Metastasis from malignant tumor of breast ([place])    liver  . Neuromuscular disorder ([facility])    lt arm numb sometimes  . Osteoporosis   . Thyroid disease   . Wears glasses     Patient Active Problem List   Diagnosis Date Noted  . Cancer related pain 05/13/2016  . RTI (respiratory tract infection) 05/13/2016  . Anemia 02/19/2016  . Port catheter in place 01/21/2016  . Right facial pain 12/03/2015  . Eczema, dyshidrotic 06/19/2015  . 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 Allergy history, radiographic dye 11/21/2014  . Metastases to the liver ([place]) 07/17/2014  . Routine general medical examination at a health care facility 07/10/2014  . Situational anxiety 04/18/2014  . Cough 03/08/2014  . Severe obesity (BMI >= 40) ([place]) 01/15/2014  . Coronary artery calcification seen on CAT scan 12/25/2013  . Atherosclerosis of aorta ([place]) 12/02/2013  . Unspecified constipation 08/21/2013  . Malignant neoplasm of lower-outer quadrant of left breast of female, estrogen receptor positive ([place]) 05/09/2013  . Venous stasis dermatitis 03/22/2013  . Bone metastasis ([place]) 01/31/2013  . Unspecified vitamin D deficiency 12/23/2012  . Low  back pain 12/01/2012  . Lymphedema of upper extremity following lymphadenectomy 02/15/2012  . Spinal stenosis in cervical region 10/28/2011  . Cervical spondylitis with radiculitis ([place]) 10/28/2011  . Hyperlipidemia with target LDL less than 100 09/03/2011  . Type 2 diabetes, uncontrolled, with neuropathy ([place]) 09/03/2011  . Osteoporosis 09/03/2011  . Hypothyroidism 09/03/2011  . Generalized OA 09/03/2011    Past Surgical History:  Procedure Laterality Date  . BREAST SURGERY  2012   rt  lump-16 nodes-in [place]  . CESAREAN SECTION    . CESAREAN SECTION    . COLONOSCOPY    . DILATION AND CURETTAGE OF UTERUS    . FOOT SURGERY     as a child for club foot  . HUMERUS IM NAIL Left 12/27/2012   Procedure: INTRAMEDULLARY (IM) NAIL HUMERAL LEFT PATHOLOGIC FRACTURE;  Surgeon: Nita Sells, MD;  Location: [facility];  Service: Orthopedics;  Laterality: Left;  . THYROIDECTOMY  2002    OB History    No data available       Home Medications    Prior to Admission medications   Medication Sig Start Date End Date Taking? Authorizing Provider  amoxicillin (AMOXIL) 500 MG tablet Take 1 tablet (500 mg total) by mouth 3 (three) times daily. 06/09/16   Chauncey Cruel, MD  aspirin EC 81 MG tablet Take 1 tablet (81 mg total) by mouth daily. 01/22/14   Sueanne Margarita, MD  atorvastatin (LIPITOR) 80 MG tablet Take 1 tablet (80 mg total) by mouth daily. 02/20/15   Sueanne Margarita, MD  docusate sodium (COLACE) 100 MG capsule Take 1 capsule (100 mg total) by mouth 2 (two) times daily as needed. Reported on 10/17/2015 Patient taking differently: Take 100 mg by mouth 2 (two) times daily as needed for mild constipation. Reported on 10/17/2015 12/03/15   Janith Lima, MD  doxepin (SINEQUAN) 25 MG capsule Take 2 capsules (50 mg total) by mouth at bedtime as needed. Patient taking differently: Take 50 mg by mouth at bedtime as needed (sleep).  09/09/15   Janith Lima, MD  furosemide (LASIX) 40 MG tablet Take 1 tablet (40 mg total) by mouth daily. 11/27/15   Janith Lima, MD  levothyroxine (SYNTHROID) 175 MCG tablet Take 1 tablet (175 mcg total) by mouth daily before breakfast. 09/10/15   Janith Lima, MD  lubiprostone (AMITIZA) 24 MCG capsule Take 1 capsule (24 mcg total) by mouth 2 (two) times daily with a meal. Patient taking differently: Take 24 mcg by mouth 2 (two) times daily as needed for constipation.  12/03/15   Janith Lima, MD  magic mouthwash SOLN Take 5 mLs by mouth 4  (four) times daily as needed for mouth pain. 01/10/16   Chauncey Cruel, MD  metFORMIN (GLUCOPHAGE) 500 MG tablet Take 1 tablet (500 mg total) by mouth daily. 05/13/16   Janith Lima, MD  oxyCODONE (OXY IR/ROXICODONE) 5 MG immediate release tablet Take 1-2 tablets (5-10 mg total) by mouth every 6 (six) hours as needed for severe pain. 07/23/16   Chauncey Cruel, MD  polyethylene glycol powder (GLYCOLAX/MIRALAX) powder Take 255 g (1 Container total) by mouth daily. Patient taking differently: Take 1 Container by mouth daily as needed for mild constipation or moderate constipation.  03/27/14   Irene Shipper, MD  potassium chloride (MICRO-K) 10 MEQ CR capsule Take 1 capsule (10 mEq total) by mouth 2 (two) times daily. 07/23/16   Chauncey Cruel, MD  pregabalin (LYRICA) 75 MG capsule Take 1 capsule (75 mg total) by mouth 2 (two) times daily. Not taking 04/21/16   Chauncey Cruel, MD  promethazine-dextromethorphan (PROMETHAZINE-DM) 6.25-15 MG/5ML syrup Take 5 mLs by mouth 4 (four) times daily as needed for cough. 05/13/16   Janith Lima, MD    Family History Family History  Problem Relation Age of Onset  . Dementia Mother   . Hearing loss Mother   . Cancer Neg Hx   . Arthritis Neg Hx   . Heart disease Neg Hx   . Hyperlipidemia Neg Hx   . Hypertension Neg Hx     Social History Social History  Substance Use Topics  . Smoking status: Former Smoker    Quit date: 12/23/1974  . Smokeless tobacco: Never Used  . Alcohol use No     Allergies   Bydureon [exenatide]; Gadolinium derivatives; and Enalapril   Review of Systems Review of Systems  Unable to perform ROS: Mental status change     Physical Exam Updated Vital Signs BP 122/61 (BP Location: Right Arm)   Pulse (!) 51   Temp 98.3 F (36.8 C) (Oral)   Resp 18   SpO2 100%   Physical Exam  Constitutional: She is oriented to person, place, and time. She appears well-developed. She appears cachectic. No distress.  HENT:    Head: Normocephalic and atraumatic.  Nose: Nose normal.  Eyes: Conjunctivae and EOM are normal. Pupils are equal, round, and reactive to light. Right eye exhibits no discharge. Left eye exhibits no discharge. No scleral icterus.  Neck: Normal range of motion. Neck supple.  Cardiovascular: Normal rate and regular rhythm.  Exam reveals no gallop and no friction rub.   No murmur heard. Pulmonary/Chest: Effort normal and breath sounds normal. No stridor. No respiratory distress. She has no rales.  Abdominal: Soft. She exhibits no distension. There is no tenderness.  Musculoskeletal: She exhibits no edema or tenderness.  Right upper extremity lymphedema.  Neurological: She is alert and oriented to person, place, and time.  Skin: Skin is warm and dry. No rash noted. She is not diaphoretic. No erythema.  Psychiatric: She has a normal mood and affect.  Vitals reviewed.    ED Treatments / Results  Labs (all labs ordered are listed, but only abnormal results are displayed) Labs Reviewed  COMPREHENSIVE METABOLIC PANEL - Abnormal; Notable for the following:       Result Value   Potassium 2.6 (*)    Glucose, Bld 137 (*)    BUN 36 (*)    Calcium 8.8 (*)    ALT 11 (*)    Total Bilirubin 2.0 (*)    All other components within normal limits  CBC - Abnormal; Notable for the following:    RDW 18.9 (*)    Platelets 113 (*)    All other components within normal limits  URINALYSIS, ROUTINE W REFLEX MICROSCOPIC - Abnormal; Notable for the following:    Color, Urine AMBER (*)    Specific Gravity, Urine >1.046 (*)    Hgb urine dipstick SMALL (*)    Ketones, ur 20 (*)    Protein, ur 30 (*)    Squamous Epithelial / LPF 0-5 (*)    All other components within normal limits  CBG MONITORING, ED - Abnormal; Notable for the following:    Glucose-Capillary 129 (*)    All other components within normal limits  URINE CULTURE  AMMONIA  ETHANOL  TSH  T4, FREE  BASIC METABOLIC  PANEL  CBC    EKG   EKG Interpretation None       Radiology Ct Head Wo Contrast  Result Date: 08/28/2016 CLINICAL DATA:  Altered mental status EXAM: CT HEAD WITHOUT CONTRAST TECHNIQUE: Contiguous axial images were obtained from the base of the skull through the vertex without intravenous contrast. COMPARISON:  10/04/2015 FINDINGS: Brain: No intracranial hemorrhage, mass effect or midline shift. Mild cerebral atrophy. Mild periventricular chronic white matter disease. No acute cortical infarction. No mass lesion is noted on this unenhanced scan. Vascular: Atherosclerotic calcifications of carotid siphon are noted. Skull: No skull fracture. Sinuses/Orbits: No acute findings Other: None IMPRESSION: No acute intracranial abnormality. Mild cerebral atrophy. Mild periventricular probable chronic white matter disease. No acute cortical infarction. Electronically Signed   By: Lahoma Crocker M.D.   On: 08/28/2016 16:50   Ct Soft Tissue Neck W Contrast  Result Date: 08/28/2016 CLINICAL DATA:  Bleeding in mouth and throat. Severe halitosis. History of diabetes, metastatic breast cancer. EXAM: CT NECK WITH CONTRAST TECHNIQUE: Multidetector CT imaging of the neck was performed using the standard protocol following the bolus administration of intravenous contrast. CONTRAST:  9mL ISOVUE-300 IOPAMIDOL (ISOVUE-300) INJECTION 61% COMPARISON:  CT neck Dec 26, 2015 FINDINGS: Pharynx and larynx: Normal. Salivary glands: Atrophic and nonacute. Thyroid: Status post thyroidectomy. Lymph nodes: No lymphadenopathy by CT size criteria. Vascular: Mild calcific atherosclerosis of the carotid bifurcations. Limited intracranial: Normal. Visualized orbits: Normal. Mastoids and visualized paranasal sinuses: Small bilateral maxillary mucosal retention cysts without paranasal sinus air-fluid levels. Mastoid air cells are well aerated. Skeleton: Large bony fragment RIGHT mandible body superior aspect with erosive margins. Interval loss of multiple RIGHT  maxillary teeth. Severe C5-6 and moderate C6-7 degenerative discs. Multilevel severe facet arthropathy. No destructive bony lesions. Dental malocclusion Upper chest: LEFT chest Port-A-Cath. Lung apices are clear. No superior mediastinal lymphadenopathy. Other: None. IMPRESSION: Interval loss of multiple RIGHT mandible teeth. RIGHT mandible probable osteomyelitis with large bony sequestrum, less likely postprocedural fracture nonunion. Electronically Signed   By: Elon Alas M.D.   On: 08/28/2016 19:03    Procedures Procedures (including critical care time)  Medications Ordered in ED Medications - No data to display   Initial Impression / Assessment and Plan / ED Course  I have reviewed the triage vital signs and the nursing notes.  Pertinent labs & imaging results that were available during my care of the patient were reviewed by me and considered in my medical decision making (see chart for details).     Workup relatively unremarkable other than hypokalemia. Patient provided with by mouth potassium however she was unable to swallow it. Will require IV potassium. Given her presentation and the need for more advanced care home patient was admitted for failure to thrive and assistance with placement.   Appreciate hospitalist admission   Fatima Blank, MD 08/29/16 [phone]

## 2016-08-28 NOTE — ED Triage Notes (Addendum)
Per EMS family request evaluation for AMS and generalized weakness. Pt alert and oriented but not to year; verbalizes Jan 2017. Initially hypotensive 82/40; given 500 ml bolus of normal saline en route with EMS.

## 2016-08-28 NOTE — H&P (Addendum)
History and Physical    Christy Harrell [id number] DOB: June 29, 1941 DOA: 08/28/2016  PCP: Christy Calico, MD Consultants:  Christy Harrell - oncology; dentist Patient coming from: home - lives alone after her mother died in 12-02-14; [place]: daughter, Christy Harrell, [id number]  Chief Complaint: altered mental status  HPI: Christy Harrell is a 76 y.o. female with medical history significant of hypothyroidism, metastatic breat cancer undergoing chemotherapy, HTN, HLD, and DM presenting with AMS.  The patient is confused and explains that her daughter felt like she wasn't walking right.  Had to see the dentist.  Her daughter talked to Dr. Jana Harrell and his nurse.  She was in the ER yesterday.  I called and spoke with her daughter, who lives in [place], [place].  Her daughter reports that she has been sounding lethargic, not laughing as usual, repeating herself, complaining about her mouth hurting. On January 2, the patient went to the oral surgeon, had teeth removed, had bone biopsy.  He gave her antibiotics (which she never took) and pain medications.  She went back and they told her there was no cancer present.  But she is still complaining about pain. Wednesday night, her daughter called starting about 6pm.  No answer.  4th call, the patient answered the phone and said she had been sleeping.  Her daughter reports that she has been sleeping all the time.  Yesterday, her cousin called her and they were unable to reach the patient.  Sent a neighbor to check on her.  They found her on the toilet, had been there for a couple of hours and was unable to get up and walk.  She required a lot of assistance walking.  They seated her in a chair in the den.  Neighbor was concerned she wasn't eating or drinking and fixed her some soup and a drink.  Meanwhile, the daughter called Dr. Virgie Harrell nurse and they encouraged her to call 911.  Paramedics came and she looked okay but her BP was a little low and the patient refused transport.   They gave a 500 cc bolus.  This AM, the cousin went to check on her and she wasn't answering the phone or door.  Went in the house to check on her - the patient had not left the chair she was placed in yesterday.  Never touched food/drink.  Incontinence.  Patient denying all of these things.  Daughter concerned about too much pain medication.  They called 911 and she again told the paramedics she didn't need to go.  They only brought her because her mental state is altered.  Symptoms really started back in August with a UTI and this has been gradually worsening since, particularly over the last 2 weeks.  Daughter is frustrated with not being able to determine what she qualifies for regarding home health services.  Seriously considering moving her mother back to [place], but the patient does not want to go.   ED Course: attempted to give 40 mEq KCl PO but the patient was unable to swallow it  Review of Systems: As per HPI; otherwise 10 point review of systems reviewed and negative.  However, this information is suspect based on the patient's altered mental status.  Ambulatory Status:  Has a cane but she doesn't know where it is  Past Medical History:  Diagnosis Date  . Arthritis   . Cancer ([place])    right breast  . Club foot    chronic limp  . Diabetes mellitus    metformin  .  GERD (gastroesophageal reflux disease)    watches diet  . History of radiation therapy 02/22/13- 03/13/13   left proximal humerus 3500 cGy 14 sessions  . Hyperlipidemia   . Hypertension   . Lymphedema    right arm  . Metastasis from malignant tumor of breast ([place])    left humerous  . Metastasis from malignant tumor of breast ([place])    liver  . Neuromuscular disorder (HCC)    lt arm numb sometimes  . Osteoporosis   . Thyroid disease   . Wears glasses     Past Surgical History:  Procedure Laterality Date  . BREAST SURGERY  2012   rt lump-16 nodes-in [place]  . CESAREAN SECTION    . CESAREAN SECTION    .  COLONOSCOPY    . DILATION AND CURETTAGE OF UTERUS    . FOOT SURGERY     as a child for club foot  . HUMERUS IM NAIL Left 12/27/2012   Procedure: INTRAMEDULLARY (IM) NAIL HUMERAL LEFT PATHOLOGIC FRACTURE;  Surgeon: Nita Sells, MD;  Location: [facility];  Service: Orthopedics;  Laterality: Left;  . THYROIDECTOMY  2002    Social History   Social History  . Marital status: Single    Spouse name: N/A  . Number of children: N/A  . Years of education: N/A   Occupational History  . Not on file.   Social History Main Topics  . Smoking status: Former Smoker    Quit date: 12/23/1974  . Smokeless tobacco: Never Used  . Alcohol use No  . Drug use: No  . Sexual activity: Not Currently   Other Topics Concern  . Not on file   Social History Narrative  . No narrative on file    Allergies  Allergen Reactions  . Bydureon [Exenatide] Rash  . Gadolinium Derivatives Other (See Comments)    Pt began having chest numbness/tingling , stated her heart felt funny, had to take her to ED for EKG per RN   CAP  . Enalapril Rash    Family History  Problem Relation Age of Onset  . Dementia Mother   . Hearing loss Mother   . Cancer Neg Hx   . Arthritis Neg Hx   . Heart disease Neg Hx   . Hyperlipidemia Neg Hx   . Hypertension Neg Hx     Prior to Admission medications   Medication Sig Start Date End Date Taking? Authorizing Provider  aspirin EC 81 MG tablet Take 1 tablet (81 mg total) by mouth daily. 01/22/14  Yes Sueanne Margarita, MD  atorvastatin (LIPITOR) 80 MG tablet Take 1 tablet (80 mg total) by mouth daily. 02/20/15  Yes Sueanne Margarita, MD  docusate sodium (COLACE) 100 MG capsule Take 1 capsule (100 mg total) by mouth 2 (two) times daily as needed. Reported on 10/17/2015 Patient taking differently: Take 100 mg by mouth 2 (two) times daily as needed for mild constipation. Reported on 10/17/2015 12/03/15  Yes Janith Lima, MD  doxepin (SINEQUAN) 25 MG capsule Take 2  capsules (50 mg total) by mouth at bedtime as needed. Patient taking differently: Take 50 mg by mouth at bedtime as needed (sleep).  09/09/15  Yes Janith Lima, MD  furosemide (LASIX) 40 MG tablet Take 1 tablet (40 mg total) by mouth daily. 11/27/15  Yes Janith Lima, MD  levothyroxine (SYNTHROID) 175 MCG tablet Take 1 tablet (175 mcg total) by mouth daily before breakfast. 09/10/15  Yes Arvid Right  Ronnald Ramp, MD  lubiprostone (AMITIZA) 24 MCG capsule Take 1 capsule (24 mcg total) by mouth 2 (two) times daily with a meal. Patient taking differently: Take 24 mcg by mouth 2 (two) times daily as needed for constipation.  12/03/15  Yes Janith Lima, MD  metFORMIN (GLUCOPHAGE) 500 MG tablet Take 1 tablet (500 mg total) by mouth daily. 05/13/16  Yes Janith Lima, MD  oxyCODONE (OXY IR/ROXICODONE) 5 MG immediate release tablet Take 1-2 tablets (5-10 mg total) by mouth every 6 (six) hours as needed for severe pain. 07/23/16  Yes Chauncey Cruel, MD  polyethylene glycol powder (GLYCOLAX/MIRALAX) powder Take 255 g (1 Container total) by mouth daily. Patient taking differently: Take 1 Container by mouth daily as needed for mild constipation or moderate constipation.  03/27/14  Yes Irene Shipper, MD  potassium chloride (MICRO-K) 10 MEQ CR capsule Take 1 capsule (10 mEq total) by mouth 2 (two) times daily. 07/23/16  Yes Chauncey Cruel, MD  pregabalin (LYRICA) 75 MG capsule Take 1 capsule (75 mg total) by mouth 2 (two) times daily. Not taking 04/21/16  Yes Chauncey Cruel, MD  amoxicillin (AMOXIL) 500 MG tablet Take 1 tablet (500 mg total) by mouth 3 (three) times daily. Patient not taking: Reported on 08/28/2016 06/09/16   Chauncey Cruel, MD  magic mouthwash SOLN Take 5 mLs by mouth 4 (four) times daily as needed for mouth pain. Patient not taking: Reported on 08/28/2016 01/10/16   Chauncey Cruel, MD  promethazine-dextromethorphan (PROMETHAZINE-DM) 6.25-15 MG/5ML syrup Take 5 mLs by mouth 4 (four) times daily  as needed for cough. Patient not taking: Reported on 08/28/2016 05/13/16   Janith Lima, MD    Physical Exam: Vitals:   08/28/16 2030 08/28/16 2121 08/28/16 2220 08/28/16 2301  BP: 105/59 110/63 111/55 120/62  Pulse: (!) 52 (!) 54 (!) 55 (!) 54  Resp: 15 15 13 17   Temp:      TempSrc:      SpO2: 100% 100% 100% 100%     General:  Appears calm and comfortable and is NAD Eyes:  PERRL, EOMI, normal lids, iris ENT:  grossly normal hearing, lips & tongue, mmm Neck:  no LAD, masses or thyromegaly Cardiovascular: RRR, no m/r/g. No LE edema.  Respiratory:  CTA bilaterally, no w/r/r. Normal respiratory effort. Abdomen: soft, ntnd, NABS Skin:  no rash or induration seen on limited exam Musculoskeletal:  grossly normal tone BUE/BLE, good ROM, no bony abnormality, mild lymphedema of the B UE Psychiatric:  grossly normal mood and affect, speech fluent and appropriate, AOx2 - but clearly not aware of why she is in the hospital or really comprehending these issues Neurologic:  CN 2-12 grossly intact, moves all extremities in coordinated fashion, sensation intact  Labs on Admission: I have personally reviewed following labs and imaging studies  CBC:  Recent Labs Lab 08/28/16 1659  WBC 5.0  HGB 12.4  HCT 36.8  MCV 88.9  PLT 123456*   Basic Metabolic Panel:  Recent Labs Lab 08/28/16 1659  NA 141  K 2.6*  CL 103  CO2 23  GLUCOSE 137*  BUN 36*  CREATININE 0.65  CALCIUM 8.8*   GFR: CrCl cannot be calculated (Unknown ideal weight.). Liver Function Tests:  Recent Labs Lab 08/28/16 1659  AST 23  ALT 11*  ALKPHOS 72  BILITOT 2.0*  PROT 7.1  ALBUMIN 3.5   No results for input(s): LIPASE, AMYLASE in the last 168 hours.  Recent Labs Lab 08/28/16  1659  AMMONIA 26   Coagulation Profile: No results for input(s): INR, PROTIME in the last 168 hours. Cardiac Enzymes: No results for input(s): CKTOTAL, CKMB, CKMBINDEX, TROPONINI in the last 168 hours. BNP (last 3 results) No  results for input(s): PROBNP in the last 8760 hours. HbA1C: No results for input(s): HGBA1C in the last 72 hours. CBG:  Recent Labs Lab 08/28/16 1612  GLUCAP 129*   Lipid Profile: No results for input(s): CHOL, HDL, LDLCALC, TRIG, CHOLHDL, LDLDIRECT in the last 72 hours. Thyroid Function Tests: No results for input(s): TSH, T4TOTAL, FREET4, T3FREE, THYROIDAB in the last 72 hours. Anemia Panel: No results for input(s): VITAMINB12, FOLATE, FERRITIN, TIBC, IRON, RETICCTPCT in the last 72 hours. Urine analysis:    Component Value Date/Time   COLORURINE YELLOW 02/18/2016 2327   APPEARANCEUR CLOUDY (A) 02/18/2016 2327   LABSPEC 1.007 02/18/2016 2327   PHURINE 6.5 02/18/2016 2327   GLUCOSEU NEGATIVE 02/18/2016 2327   GLUCOSEU NEGATIVE 10/19/2014 1246   HGBUR SMALL (A) 02/18/2016 2327   BILIRUBINUR NEGATIVE 02/18/2016 2327   KETONESUR NEGATIVE 02/18/2016 2327   PROTEINUR 30 (A) 02/18/2016 2327   UROBILINOGEN 1.0 10/19/2014 1246   NITRITE NEGATIVE 02/18/2016 2327   LEUKOCYTESUR LARGE (A) 02/18/2016 2327    Creatinine Clearance: CrCl cannot be calculated (Unknown ideal weight.).  Sepsis Labs: @LABRCNTIP (procalcitonin:4,lacticidven:4) )No results found for this or any previous visit (from the past 240 hour(s)).   Radiological Exams on Admission: Ct Head Wo Contrast  Result Date: 08/28/2016 CLINICAL DATA:  Altered mental status EXAM: CT HEAD WITHOUT CONTRAST TECHNIQUE: Contiguous axial images were obtained from the base of the skull through the vertex without intravenous contrast. COMPARISON:  10/04/2015 FINDINGS: Brain: No intracranial hemorrhage, mass effect or midline shift. Mild cerebral atrophy. Mild periventricular chronic white matter disease. No acute cortical infarction. No mass lesion is noted on this unenhanced scan. Vascular: Atherosclerotic calcifications of carotid siphon are noted. Skull: No skull fracture. Sinuses/Orbits: No acute findings Other: None IMPRESSION: No  acute intracranial abnormality. Mild cerebral atrophy. Mild periventricular probable chronic white matter disease. No acute cortical infarction. Electronically Signed   By: Lahoma Crocker M.D.   On: 08/28/2016 16:50   Ct Soft Tissue Neck W Contrast  Result Date: 08/28/2016 CLINICAL DATA:  Bleeding in mouth and throat. Severe halitosis. History of diabetes, metastatic breast cancer. EXAM: CT NECK WITH CONTRAST TECHNIQUE: Multidetector CT imaging of the neck was performed using the standard protocol following the bolus administration of intravenous contrast. CONTRAST:  47mL ISOVUE-300 IOPAMIDOL (ISOVUE-300) INJECTION 61% COMPARISON:  CT neck Dec 26, 2015 FINDINGS: Pharynx and larynx: Normal. Salivary glands: Atrophic and nonacute. Thyroid: Status post thyroidectomy. Lymph nodes: No lymphadenopathy by CT size criteria. Vascular: Mild calcific atherosclerosis of the carotid bifurcations. Limited intracranial: Normal. Visualized orbits: Normal. Mastoids and visualized paranasal sinuses: Small bilateral maxillary mucosal retention cysts without paranasal sinus air-fluid levels. Mastoid air cells are well aerated. Skeleton: Large bony fragment RIGHT mandible body superior aspect with erosive margins. Interval loss of multiple RIGHT maxillary teeth. Severe C5-6 and moderate C6-7 degenerative discs. Multilevel severe facet arthropathy. No destructive bony lesions. Dental malocclusion Upper chest: LEFT chest Port-A-Cath. Lung apices are clear. No superior mediastinal lymphadenopathy. Other: None. IMPRESSION: Interval loss of multiple RIGHT mandible teeth. RIGHT mandible probable osteomyelitis with large bony sequestrum, less likely postprocedural fracture nonunion. Electronically Signed   By: Elon Alas M.D.   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, jaw (HCC)   Failure to thrive/AMS -It sounds like this has been progressive for her since August -While this may be related to overuse of pain medications (only received #20 Vicodin on 1/2 but received #100 5 mg Oxycodone on 12/14 and we don't know her use pattern or how many she has left) or UTI (UA is still pending), with this continued deterioration that sounds more chronic, the concern would be that this is a terminal deterioration related to her underlying malignancy -Head CT today was negative -Will place in observation, but her labs are generally unremarkable (other than this pending UA) and she does not appear to meet criteria for admission at this time -She likely has reached a point where she is no longer safe to live alone- unless she has a marked improvement -Her daughter and I discussed this at length.  She will likely need placement; to live with a family member; or 24 hours hired in-home care. -Her daughter requests CM/SW assistance with this process, will place consults. -Her daughter also requests to be called with an update daily. -Will also request swallow evaluation, since patient appears to have been unable to swallow PO KCl.  Will make NPO until this is complete. -Hold sedating agents including Oxycodone, Lyrica, and Doxepin for now. -Possible mild dehydration, BUN 36/Creatinine 0.65, which would be unlikely to seriously impact her mental status. -Will check TSH and free T4, continue Synthroid at current dose for now.  Hypokalemia -K 2.6 -Patient unable to swallow KCl tablets -Will give 30 mEq and 80 mEq infusions, which would be expected to raise K+ to 3.7 -Will recheck K+ in AM -Continue home K+ supplementation -Monitor on telemetry for now due to markedly low  K+  DM -Glucose 137 -Hold PO medications -Cover with SSI  Stage IV breast cancer -Initial diagnosis in 2012, metastatic disease with progression on abdominal MRI in 4/17, currently on eribulin as of 8/17 -She was supposed to f/u with Dr. Jana Harrell this month but has not seen him yet -Bilirubin of 2 is likely related to this issue. -Platelets 113, will follow.  Osteonecrosis of the jaw -Patient with dental extract by Dr. Romie Minus in early January -Bone was biopsied and sent to [facility] with result read as osteonecrosis of the jaw, no cancer or evidence of infection noted -While it is possible that she has developed osteomyelitis in the interim, with a normal WBC count and prior bone biopsy this seems unlikely -Will not treat for now   DVT prophylaxis: Lovenox for now, but will need to hold if platelets drop to below 100k Code Status: DNR - confirmed with family Family Communication: Prolonged conversation via telephone with patient's daughter Disposition Plan: To be determined Consults called: SW, CM, PT, ST Admission status: It is my clinical opinion that referral for OBSERVATION is reasonable and necessary in this patient based on the above information provided. The aforementioned taken together are felt to place the patient at high risk for further clinical deterioration. However it is anticipated that the patient may be medically stable for discharge from the hospital within 24 to 48 hours.    Karmen Bongo MD [facility]  If 7PM-7AM, please contact night-coverage [email or web address] Password [email or web address]  08/28/2016, 11:42 PM

## 2016-08-29 ENCOUNTER — Observation Stay (HOSPITAL_COMMUNITY): Payer: Medicare Other

## 2016-08-29 ENCOUNTER — Encounter: Payer: Self-pay | Admitting: Internal Medicine

## 2016-08-29 ENCOUNTER — Telehealth: Payer: Self-pay | Admitting: Oncology

## 2016-08-29 DIAGNOSIS — I739 Peripheral vascular disease, unspecified: Secondary | ICD-10-CM | POA: Diagnosis not present

## 2016-08-29 DIAGNOSIS — I959 Hypotension, unspecified: Secondary | ICD-10-CM | POA: Diagnosis not present

## 2016-08-29 DIAGNOSIS — R4182 Altered mental status, unspecified: Secondary | ICD-10-CM | POA: Diagnosis not present

## 2016-08-29 DIAGNOSIS — K219 Gastro-esophageal reflux disease without esophagitis: Secondary | ICD-10-CM | POA: Diagnosis present

## 2016-08-29 DIAGNOSIS — C7951 Secondary malignant neoplasm of bone: Secondary | ICD-10-CM | POA: Diagnosis not present

## 2016-08-29 DIAGNOSIS — Z515 Encounter for palliative care: Secondary | ICD-10-CM | POA: Diagnosis not present

## 2016-08-29 DIAGNOSIS — D61818 Other pancytopenia: Secondary | ICD-10-CM | POA: Diagnosis not present

## 2016-08-29 DIAGNOSIS — E1140 Type 2 diabetes mellitus with diabetic neuropathy, unspecified: Secondary | ICD-10-CM | POA: Diagnosis not present

## 2016-08-29 DIAGNOSIS — E860 Dehydration: Secondary | ICD-10-CM

## 2016-08-29 DIAGNOSIS — G9340 Encephalopathy, unspecified: Secondary | ICD-10-CM

## 2016-08-29 DIAGNOSIS — E876 Hypokalemia: Secondary | ICD-10-CM | POA: Diagnosis present

## 2016-08-29 DIAGNOSIS — Z170 Estrogen receptor positive status [ER+]: Secondary | ICD-10-CM | POA: Diagnosis not present

## 2016-08-29 DIAGNOSIS — M7989 Other specified soft tissue disorders: Secondary | ICD-10-CM

## 2016-08-29 DIAGNOSIS — R1310 Dysphagia, unspecified: Secondary | ICD-10-CM | POA: Diagnosis present

## 2016-08-29 DIAGNOSIS — E785 Hyperlipidemia, unspecified: Secondary | ICD-10-CM | POA: Diagnosis present

## 2016-08-29 DIAGNOSIS — M87180 Osteonecrosis due to drugs, jaw: Secondary | ICD-10-CM | POA: Diagnosis not present

## 2016-08-29 DIAGNOSIS — L8990 Pressure ulcer of unspecified site, unspecified stage: Secondary | ICD-10-CM | POA: Diagnosis present

## 2016-08-29 DIAGNOSIS — R32 Unspecified urinary incontinence: Secondary | ICD-10-CM | POA: Diagnosis present

## 2016-08-29 DIAGNOSIS — R627 Adult failure to thrive: Secondary | ICD-10-CM | POA: Diagnosis not present

## 2016-08-29 DIAGNOSIS — R609 Edema, unspecified: Secondary | ICD-10-CM | POA: Diagnosis not present

## 2016-08-29 DIAGNOSIS — D696 Thrombocytopenia, unspecified: Secondary | ICD-10-CM

## 2016-08-29 DIAGNOSIS — R400 Somnolence: Secondary | ICD-10-CM | POA: Diagnosis not present

## 2016-08-29 DIAGNOSIS — I82412 Acute embolism and thrombosis of left femoral vein: Secondary | ICD-10-CM | POA: Diagnosis not present

## 2016-08-29 DIAGNOSIS — Z66 Do not resuscitate: Secondary | ICD-10-CM | POA: Diagnosis not present

## 2016-08-29 DIAGNOSIS — G9341 Metabolic encephalopathy: Secondary | ICD-10-CM | POA: Diagnosis not present

## 2016-08-29 DIAGNOSIS — C787 Secondary malignant neoplasm of liver and intrahepatic bile duct: Secondary | ICD-10-CM | POA: Diagnosis not present

## 2016-08-29 DIAGNOSIS — E1149 Type 2 diabetes mellitus with other diabetic neurological complication: Secondary | ICD-10-CM | POA: Diagnosis not present

## 2016-08-29 DIAGNOSIS — Z7189 Other specified counseling: Secondary | ICD-10-CM | POA: Diagnosis not present

## 2016-08-29 DIAGNOSIS — E1165 Type 2 diabetes mellitus with hyperglycemia: Secondary | ICD-10-CM | POA: Diagnosis present

## 2016-08-29 DIAGNOSIS — R159 Full incontinence of feces: Secondary | ICD-10-CM | POA: Diagnosis present

## 2016-08-29 DIAGNOSIS — I890 Lymphedema, not elsewhere classified: Secondary | ICD-10-CM | POA: Diagnosis present

## 2016-08-29 DIAGNOSIS — I10 Essential (primary) hypertension: Secondary | ICD-10-CM | POA: Diagnosis present

## 2016-08-29 DIAGNOSIS — T50904A Poisoning by unspecified drugs, medicaments and biological substances, undetermined, initial encounter: Secondary | ICD-10-CM | POA: Diagnosis not present

## 2016-08-29 DIAGNOSIS — E43 Unspecified severe protein-calorie malnutrition: Secondary | ICD-10-CM | POA: Diagnosis not present

## 2016-08-29 DIAGNOSIS — M879 Osteonecrosis, unspecified: Secondary | ICD-10-CM | POA: Diagnosis not present

## 2016-08-29 DIAGNOSIS — Z86718 Personal history of other venous thrombosis and embolism: Secondary | ICD-10-CM | POA: Diagnosis not present

## 2016-08-29 DIAGNOSIS — M8708 Idiopathic aseptic necrosis of bone, other site: Secondary | ICD-10-CM | POA: Diagnosis not present

## 2016-08-29 DIAGNOSIS — E039 Hypothyroidism, unspecified: Secondary | ICD-10-CM | POA: Diagnosis present

## 2016-08-29 DIAGNOSIS — Z7901 Long term (current) use of anticoagulants: Secondary | ICD-10-CM | POA: Diagnosis not present

## 2016-08-29 DIAGNOSIS — C50512 Malignant neoplasm of lower-outer quadrant of left female breast: Secondary | ICD-10-CM | POA: Diagnosis present

## 2016-08-29 DIAGNOSIS — R404 Transient alteration of awareness: Secondary | ICD-10-CM | POA: Diagnosis present

## 2016-08-29 LAB — BASIC METABOLIC PANEL
ANION GAP: 9 (ref 5–15)
BUN: 30 mg/dL — ABNORMAL HIGH (ref 6–20)
CALCIUM: 8.4 mg/dL — AB (ref 8.9–10.3)
CHLORIDE: 109 mmol/L (ref 101–111)
CO2: 26 mmol/L (ref 22–32)
Creatinine, Ser: 0.44 mg/dL (ref 0.44–1.00)
GFR calc Af Amer: >60 mL/min (ref >60)
GFR calc non Af Amer: >60 mL/min (ref >60)
Glucose, Bld: 109 mg/dL — ABNORMAL HIGH (ref 65–99)
Potassium: 3.9 mmol/L (ref 3.5–5.1)
SODIUM: 144 mmol/L (ref 135–145)

## 2016-08-29 LAB — VITAMIN B12: Vitamin B-12: 1645 pg/mL — ABNORMAL HIGH (ref 180–914)

## 2016-08-29 LAB — CBC
HCT: 34.4 % — ABNORMAL LOW (ref 36.0–46.0)
HEMOGLOBIN: 11.3 g/dL — AB (ref 12.0–15.0)
MCH: 29.2 pg (ref 26.0–34.0)
MCHC: 32.8 g/dL (ref 30.0–36.0)
MCV: 88.9 fL (ref 78.0–100.0)
PLATELETS: 96 10*3/uL — AB (ref 150–400)
RBC: 3.87 MIL/uL (ref 3.87–5.11)
RDW: 19.2 % — ABNORMAL HIGH (ref 11.5–15.5)
WBC: 4.6 10*3/uL (ref 4.0–10.5)

## 2016-08-29 LAB — URINALYSIS, ROUTINE W REFLEX MICROSCOPIC
BILIRUBIN URINE: NEGATIVE
Bacteria, UA: NONE SEEN
Glucose, UA: NEGATIVE mg/dL
Ketones, ur: 20 mg/dL — AB
Leukocytes, UA: NEGATIVE
Nitrite: NEGATIVE
Protein, ur: 30 mg/dL — AB
pH: 6 (ref 5.0–8.0)

## 2016-08-29 LAB — PROTIME-INR
INR: 1.25
Prothrombin Time: 15.8 seconds — ABNORMAL HIGH (ref 11.4–15.2)

## 2016-08-29 LAB — GLUCOSE, CAPILLARY
GLUCOSE-CAPILLARY: 86 mg/dL (ref 65–99)
Glucose-Capillary: 95 mg/dL (ref 65–99)
Glucose-Capillary: 98 mg/dL (ref 65–99)
Glucose-Capillary: 123 mg/dL — ABNORMAL HIGH (ref 65–99)

## 2016-08-29 LAB — APTT: APTT: 38 s — AB (ref 24–36)

## 2016-08-29 LAB — T4, FREE: Free T4: 0.86 ng/dL (ref 0.61–1.12)

## 2016-08-29 LAB — FIBRINOGEN: Fibrinogen: 102 mg/dL — ABNORMAL LOW (ref 210–475)

## 2016-08-29 LAB — TSH: TSH: 2.106 u[IU]/mL (ref 0.350–4.500)

## 2016-08-29 MED ORDER — CHLORHEXIDINE GLUCONATE 0.12 % MT SOLN
15.0000 mL | Freq: Two times a day (BID) | OROMUCOSAL | Status: DC
  Administered 2016-08-29 – 2016-09-18 (×41): 15 mL via OROMUCOSAL
  Filled 2016-08-29 (×41): qty 15

## 2016-08-29 MED ORDER — ACETAMINOPHEN 650 MG RE SUPP: 650.0000 mg | Freq: Four times a day (QID) | RECTAL | Status: DC | PRN

## 2016-08-29 MED ORDER — LUBIPROSTONE 24 MCG PO CAPS
24.0000 ug | ORAL_CAPSULE | Freq: Two times a day (BID) | ORAL | Status: DC | PRN
  Filled 2016-08-29: qty 1

## 2016-08-29 MED ORDER — APIXABAN 5 MG PO TABS
5.0000 mg | ORAL_TABLET | Freq: Two times a day (BID) | ORAL | Status: DC
  Administered 2016-09-05: 5 mg via ORAL
  Filled 2016-08-29: qty 1

## 2016-08-29 MED ORDER — ACETAMINOPHEN 325 MG PO TABS
650.0000 mg | ORAL_TABLET | Freq: Four times a day (QID) | ORAL | Status: DC | PRN
  Administered 2016-09-06 – 2016-09-10 (×3): 650 mg via ORAL
  Filled 2016-08-29 (×4): qty 2

## 2016-08-29 MED ORDER — MAGIC MOUTHWASH
5.0000 mL | Freq: Four times a day (QID) | ORAL | Status: DC | PRN
  Administered 2016-09-16: 5 mL via ORAL
  Filled 2016-08-29 (×2): qty 5

## 2016-08-29 MED ORDER — APIXABAN 5 MG PO TABS
10.0000 mg | ORAL_TABLET | Freq: Two times a day (BID) | ORAL | Status: AC
  Administered 2016-08-29 – 2016-09-04 (×14): 10 mg via ORAL
  Filled 2016-08-29 (×16): qty 2

## 2016-08-29 MED ORDER — ENOXAPARIN SODIUM 40 MG/0.4ML ~~LOC~~ SOLN
40.0000 mg | Freq: Every day | SUBCUTANEOUS | Status: DC
  Administered 2016-08-29: 40 mg via SUBCUTANEOUS
  Filled 2016-08-29: qty 0.4

## 2016-08-29 MED ORDER — LIP MEDEX EX OINT
TOPICAL_OINTMENT | CUTANEOUS | Status: DC | PRN
  Filled 2016-08-29: qty 7

## 2016-08-29 MED ORDER — INSULIN ASPART 100 UNIT/ML ~~LOC~~ SOLN
0.0000 [IU] | Freq: Three times a day (TID) | SUBCUTANEOUS | Status: DC
  Administered 2016-08-29: 1 [IU] via SUBCUTANEOUS

## 2016-08-29 MED ORDER — ONDANSETRON HCL 4 MG/2ML IJ SOLN
4.0000 mg | Freq: Four times a day (QID) | INTRAMUSCULAR | Status: DC | PRN
  Administered 2016-08-31: 4 mg via INTRAVENOUS
  Filled 2016-08-29: qty 2

## 2016-08-29 MED ORDER — ATORVASTATIN CALCIUM 80 MG PO TABS
80.0000 mg | ORAL_TABLET | Freq: Every day | ORAL | Status: DC
  Administered 2016-08-29 – 2016-09-01 (×4): 80 mg via ORAL
  Filled 2016-08-29: qty 1
  Filled 2016-08-29: qty 2
  Filled 2016-08-29: qty 1
  Filled 2016-08-29: qty 2

## 2016-08-29 MED ORDER — DOCUSATE SODIUM 100 MG PO CAPS: 100.0000 mg | ORAL_CAPSULE | Freq: Two times a day (BID) | ORAL | Status: DC | PRN

## 2016-08-29 MED ORDER — ASPIRIN EC 81 MG PO TBEC
81.0000 mg | DELAYED_RELEASE_TABLET | Freq: Every day | ORAL | Status: DC
  Administered 2016-08-29 – 2016-08-30 (×2): 81 mg via ORAL
  Filled 2016-08-29 (×2): qty 1

## 2016-08-29 MED ORDER — ONDANSETRON HCL 4 MG PO TABS: 4.0000 mg | ORAL_TABLET | Freq: Four times a day (QID) | ORAL | Status: DC | PRN

## 2016-08-29 MED ORDER — ORAL CARE MOUTH RINSE
15.0000 mL | Freq: Two times a day (BID) | OROMUCOSAL | Status: DC
  Administered 2016-08-29 – 2016-09-17 (×30): 15 mL via OROMUCOSAL

## 2016-08-29 MED ORDER — LEVOTHYROXINE SODIUM 75 MCG PO TABS
175.0000 ug | ORAL_TABLET | Freq: Every day | ORAL | Status: DC
  Administered 2016-08-29 – 2016-09-15 (×18): 175 ug via ORAL
  Filled 2016-08-29 (×18): qty 1

## 2016-08-29 MED ORDER — POTASSIUM CHLORIDE CRYS ER 10 MEQ PO TBCR
10.0000 meq | EXTENDED_RELEASE_TABLET | Freq: Two times a day (BID) | ORAL | Status: DC
  Administered 2016-08-29 – 2016-09-02 (×10): 10 meq via ORAL
  Filled 2016-08-29 (×11): qty 1

## 2016-08-29 MED ORDER — SODIUM CHLORIDE 0.9% FLUSH
10.0000 mL | INTRAVENOUS | Status: DC | PRN
  Administered 2016-08-29 – 2016-09-18 (×17): 10 mL
  Filled 2016-08-29 (×17): qty 40

## 2016-08-29 MED ORDER — POLYETHYLENE GLYCOL 3350 17 G PO PACK: 1.0000 | PACK | Freq: Every day | ORAL | Status: DC | PRN

## 2016-08-29 MED ORDER — LACTATED RINGERS IV SOLN
INTRAVENOUS | Status: DC
  Administered 2016-08-29 – 2016-08-30 (×2): via INTRAVENOUS

## 2016-08-29 NOTE — Telephone Encounter (Signed)
Left message for 2/20 lab/fu. Schedule mailed. Not able to schedule for 1/24 due to not able to reach patient.

## 2016-08-29 NOTE — Progress Notes (Signed)
PT Cancellation Note  Patient Details Name: Christy Harrell MRN: [id number] DOB: 1941-05-14   Cancelled Treatment:    Reason Eval/Treat Not Completed: Medical issues which prohibited therapy (Positive for LE DVT. will check  back when medically ready. )   Marcelino Freestone PT [id number]  08/29/2016, 2:27 PM

## 2016-08-29 NOTE — Evaluation (Signed)
Clinical/Bedside Swallow Evaluation Patient Details  Name: Christy Harrell MRN: [id number] Date of Birth: 09/18/1940  Today's Date: 08/29/2016 Time: SLP Start Time (ACUTE ONLY): 1412 SLP Stop Time (ACUTE ONLY): 1438 SLP Time Calculation (min) (ACUTE ONLY): 26 min  Past Medical History:  Past Medical History:  Diagnosis Date  . Arthritis   . Cancer ([place])    right breast  . Club foot    chronic limp  . Diabetes mellitus    metformin  . GERD (gastroesophageal reflux disease)    watches diet  . History of radiation therapy 02/22/13- 03/13/13   left proximal humerus 3500 cGy 14 sessions  . Hyperlipidemia   . Hypertension   . Lymphedema    right arm  . Metastasis from malignant tumor of breast ([place])    left humerous  . Metastasis from malignant tumor of breast ([place])    liver  . Neuromuscular disorder (HCC)    lt arm numb sometimes  . Osteoporosis   . Thyroid disease   . Wears glasses    Past Surgical History:  Past Surgical History:  Procedure Laterality Date  . BREAST SURGERY  2012   rt lump-16 nodes-in [place]  . CESAREAN SECTION    . CESAREAN SECTION    . COLONOSCOPY    . DILATION AND CURETTAGE OF UTERUS    . FOOT SURGERY     as a child for club foot  . HUMERUS IM NAIL Left 12/27/2012   Procedure: INTRAMEDULLARY (IM) NAIL HUMERAL LEFT PATHOLOGIC FRACTURE;  Surgeon: Nita Sells, MD;  Location: [facility];  Service: Orthopedics;  Laterality: Left;  . THYROIDECTOMY  20033   HPI:  76 year old female with a history of metastatic breast cancer, hypertension, diabetes mellitus, hyperlipidemia presenting with altered mental status. Pt also with osteonecrosis of the jaw with recent dental extraction and bone biopsy. CT Neck 1/19 showed large bony sequestrum.   Assessment / Plan / Recommendation Clinical Impression  Pt has difficulties with mastication, which may not be far from baseline given her dental extraction last month. She does a good job of taking  very small bites and using liquid washes, and she does have efficient oral clearance despite needing increased time. RN reports that pt has been requesting more solid foods. Recommend advancement to Dys 2 diet and thin liquids, to allow for more food options while not making oral phase too effortful. SLP will f/u briefly for tolerance.    Aspiration Risk  Mild aspiration risk    Diet Recommendation Dysphagia 2 (Fine chop);Thin liquid   Liquid Administration via: Cup;Straw Medication Administration: Whole meds with puree Supervision: Patient able to self feed;Intermittent supervision to cue for compensatory strategies (close) Compensations: Minimize environmental distractions;Slow rate;Small sips/bites;Follow solids with liquid Postural Changes: Seated upright at 90 degrees    Other  Recommendations Oral Care Recommendations: Oral care BID   Follow up Recommendations 24 hour supervision/assistance      Frequency and Duration min 2x/week  1 week       Prognosis Prognosis for Safe Diet Advancement: Fair Barriers to Reach Goals: Cognitive deficits      Swallow Study   General HPI: 76 year old female with a history of metastatic breast cancer, hypertension, diabetes mellitus, hyperlipidemia presenting with altered mental status. Pt also with osteonecrosis of the jaw with recent dental extraction and bone biopsy. CT Neck 1/19 showed large bony sequestrum. Type of Study: Bedside Swallow Evaluation Previous Swallow Assessment: none in chart Diet Prior to this Study: Dysphagia 1 (puree);Thin  liquids Temperature Spikes Noted: No Respiratory Status: Room air History of Recent Intubation: No Behavior/Cognition: Alert;Cooperative;Pleasant mood;Requires cueing Oral Cavity Assessment: Other (comment) (dried blood around lips, halitosis) Oral Care Completed by SLP: No Oral Cavity - Dentition: Missing dentition Vision: Functional for self-feeding Self-Feeding Abilities: Able to feed  self;Needs assist Patient Positioning: Upright in bed Baseline Vocal Quality: Normal    Oral/Motor/Sensory Function Overall Oral Motor/Sensory Function: Generalized oral weakness   Ice Chips Ice chips: Not tested   Thin Liquid Thin Liquid: Within functional limits Presentation: Cup;Self Fed;Straw    Nectar Thick Nectar Thick Liquid: Not tested   Honey Thick Honey Thick Liquid: Not tested   Puree Puree: Within functional limits Presentation: Spoon   Solid   GO   Solid: Impaired Presentation: Self Fed Oral Phase Functional Implications: Impaired mastication        Germain Osgood 08/29/2016,3:00 PM  Germain Osgood, M.A. CCC-SLP [phone]

## 2016-08-29 NOTE — Progress Notes (Signed)
PROGRESS NOTE  Christy Harrell [id number] DOB: 11/05/1940 DOA: 08/28/2016 PCP: [name], MD  Brief History:  76 year old female with a history of metastatic breast cancer, hypertension, diabetes mellitus, hyperlipidemia presenting with altered mental status. The patient is poor historian.  Much of the history is provided through the patient's daughter who lives in [place]. Apparently, the patient's daughter noted that the patient was sleepy and repeating herself on the phone on the evening of 08/26/2016. On the morning of 08/27/2016, the patient's cousin was unable to reach the patient, and so the neighbor to check up on the patient. The patient was found sitting on the commode and unable to get up and walk. EMS was activated, and the patient was transferred to sit in a chair and that done. She was noted to be hypotensive at that time and given 500 mL bolus of fluid. The patient refused transfer to the hospital. On the morning of 08/28/2016, the patient's cousin went to check up on the patient again, and the patient was noted not to have moved from the chair in the den from the previous day. Apparently, the patient was confused and noted to be incontinent of urine and stool. EMS was activated, and the patient was brought to the emergency room for further evaluation. The patient's daughter states that the patient has had a gradual functional decline since August 2017. In emergency department, the patient was noted to be hypokalemic. Further workup revealed WBC 11.6. Urinalysis was negative for pyuria. Ammonia was 26.  Assessment/Plan: Acute metabolic encephalopathy -TSH 2.106 -Ammonia 26 -Check serum B12, RPR -MRI brain rule out metastasis -Dehydration and volume depletion contributing to her encephalopathy -Urinalysis negative for pyuria -08/28/16--CT brain neg -check EKG  Failure to thrive -Consult nutrition -PT evaluation -Suspect she will need skilled nursing  facility  Dehydration -The patient presented with hemoconcentration and elevated BUN above her normal baseline -Continue IV fluids  Metastatic breast cancer -Initially diagnosed in March 2012 -Metastasis noted initially May 2014--metastasis to bone and liver -Has had history of pathologic fracture to the humerus -last dose of eribulin 07/07/16 -follows Dr. [name]  Osteonecrosis of the jaw -per Dr. Wyvonne Lenz was biopsied and sent to [facility] with result read as osteonecrosis of the jaw, no cancer or evidence of infection noted -08/28/2016 CT neck right mandible with large bony sequestrum  Hypokalemia -Replete -Check magnesium  Thrombocytopenia -Serum B12 -TSH 2.106 -Check fibrinogen -INR elevated PTT  Left leg pain and edema -venous duplex  DM2 -check A1C -allow liberal control  Hyperlipidemia -continue statin    Disposition Plan:   SNF 1/22 if stable Family Communication:   Family at bedside  Consultants:  none  Code Status:  DNR  DVT Prophylaxis:  SCDs   Procedures: As Listed in Progress Note Above  Antibiotics: None    Subjective: Patient denies fevers, chills, headache, chest pain, dyspnea, nausea, vomiting, diarrhea, abdominal pain, dysuria, hematuria, hematochezia, and melena.   Objective: Vitals:   08/29/16 0021 08/29/16 0021 08/29/16 0041 08/29/16 0407  BP:   (!) 116/39 127/60  Pulse: (!) 55  (!) 55 (!) 57  Resp:   20 16  Temp: 98.2 F (36.8 C) 98.2 F (36.8 C) 97.6 F (36.4 C) 97.5 F (36.4 C)  TempSrc: Oral  Oral Oral  SpO2:   100% 100%  Weight:   51.8 kg (114 lb 3.2 oz)   Height:   5' (1.524 m)  Intake/Output Summary (Last 24 hours) at 08/29/16 1006 Last data filed at 08/29/16 0858  Gross per 24 hour  Intake              130 ml  Output              400 ml  Net             -270 ml   Weight change:  Exam:   General:  Pt is alert orient x 2, follows commands appropriately, not in acute distress  HEENT: No  icterus, No thrush, No neck mass, [place]/AT  Cardiovascular: RRR, S1/S2, no rubs, no gallops  Respiratory: CTA bilaterally, no wheezing, no crackles, no rhonchi  Abdomen: Soft/+BS, non tender, non distended, no guarding  Extremities: trace LE edema, No lymphangitis, No petechiae, No rashes, no synovitis   Data Reviewed: I have personally reviewed following labs and imaging studies Basic Metabolic Panel:  Recent Labs Lab 08/28/16 1659 08/29/16 0649  NA 141 144  K 2.6* 3.9  CL 103 109  CO2 23 26  GLUCOSE 137* 109*  BUN 36* 30*  CREATININE 0.65 0.44  CALCIUM 8.8* 8.4*   Liver Function Tests:  Recent Labs Lab 08/28/16 1659  AST 23  ALT 11*  ALKPHOS 72  BILITOT 2.0*  PROT 7.1  ALBUMIN 3.5   No results for input(s): LIPASE, AMYLASE in the last 168 hours.  Recent Labs Lab 08/28/16 1659  AMMONIA 26   Coagulation Profile: No results for input(s): INR, PROTIME in the last 168 hours. CBC:  Recent Labs Lab 08/28/16 1659 08/29/16 0649  WBC 5.0 4.6  HGB 12.4 11.3*  HCT 36.8 34.4*  MCV 88.9 88.9  PLT 113* 96*   Cardiac Enzymes: No results for input(s): CKTOTAL, CKMB, CKMBINDEX, TROPONINI in the last 168 hours. BNP: Invalid input(s): POCBNP CBG:  Recent Labs Lab 08/28/16 1612 08/29/16 0734  GLUCAP 129* 95   HbA1C: No results for input(s): HGBA1C in the last 72 hours. Urine analysis:    Component Value Date/Time   COLORURINE AMBER (A) 08/28/2016 2352   APPEARANCEUR CLEAR 08/28/2016 2352   LABSPEC >1.046 (H) 08/28/2016 2352   PHURINE 6.0 08/28/2016 2352   GLUCOSEU NEGATIVE 08/28/2016 2352   GLUCOSEU NEGATIVE 10/19/2014 1246   HGBUR SMALL (A) 08/28/2016 2352   BILIRUBINUR NEGATIVE 08/28/2016 2352   KETONESUR 20 (A) 08/28/2016 2352   PROTEINUR 30 (A) 08/28/2016 2352   UROBILINOGEN 1.0 10/19/2014 1246   NITRITE NEGATIVE 08/28/2016 2352   LEUKOCYTESUR NEGATIVE 08/28/2016 2352   Sepsis Labs: @LABRCNTIP (procalcitonin:4,lacticidven:4) )No results found  for this or any previous visit (from the past 240 hour(s)).   Scheduled Meds: . aspirin EC  81 mg Oral Daily  . atorvastatin  80 mg Oral Daily  . chlorhexidine  15 mL Mouth Rinse BID  . enoxaparin (LOVENOX) injection  40 mg Subcutaneous QHS  . insulin aspart  0-9 Units Subcutaneous TID WC  . levothyroxine  175 mcg Oral QAC breakfast  . mouth rinse  15 mL Mouth Rinse q12n4p  . potassium chloride  10 mEq Oral BID   Continuous Infusions: . lactated ringers Stopped (08/29/16 0110)    Procedures/Studies: Ct Head Wo Contrast  Result Date: 08/28/2016 CLINICAL DATA:  Altered mental status EXAM: CT HEAD WITHOUT CONTRAST TECHNIQUE: Contiguous axial images were obtained from the base of the skull through the vertex without intravenous contrast. COMPARISON:  10/04/2015 FINDINGS: Brain: No intracranial hemorrhage, mass effect or midline shift. Mild cerebral atrophy. Mild periventricular chronic white  matter disease. No acute cortical infarction. No mass lesion is noted on this unenhanced scan. Vascular: Atherosclerotic calcifications of carotid siphon are noted. Skull: No skull fracture. Sinuses/Orbits: No acute findings Other: None IMPRESSION: No acute intracranial abnormality. Mild cerebral atrophy. Mild periventricular probable chronic white matter disease. No acute cortical infarction. Electronically Signed   By: Lahoma Crocker M.D.   On: 08/28/2016 16:50   Ct Soft Tissue Neck W Contrast  Result Date: 08/28/2016 CLINICAL DATA:  Bleeding in mouth and throat. Severe halitosis. History of diabetes, metastatic breast cancer. EXAM: CT NECK WITH CONTRAST TECHNIQUE: Multidetector CT imaging of the neck was performed using the standard protocol following the bolus administration of intravenous contrast. CONTRAST:  73mL ISOVUE-300 IOPAMIDOL (ISOVUE-300) INJECTION 61% COMPARISON:  CT neck Dec 26, 2015 FINDINGS: Pharynx and larynx: Normal. Salivary glands: Atrophic and nonacute. Thyroid: Status post thyroidectomy.  Lymph nodes: No lymphadenopathy by CT size criteria. Vascular: Mild calcific atherosclerosis of the carotid bifurcations. Limited intracranial: Normal. Visualized orbits: Normal. Mastoids and visualized paranasal sinuses: Small bilateral maxillary mucosal retention cysts without paranasal sinus air-fluid levels. Mastoid air cells are well aerated. Skeleton: Large bony fragment RIGHT mandible body superior aspect with erosive margins. Interval loss of multiple RIGHT maxillary teeth. Severe C5-6 and moderate C6-7 degenerative discs. Multilevel severe facet arthropathy. No destructive bony lesions. Dental malocclusion Upper chest: LEFT chest Port-A-Cath. Lung apices are clear. No superior mediastinal lymphadenopathy. Other: None. IMPRESSION: Interval loss of multiple RIGHT mandible teeth. RIGHT mandible probable osteomyelitis with large bony sequestrum, less likely postprocedural fracture nonunion. Electronically Signed   By: Elon Alas M.D.   On: 08/28/2016 19:03   Mr Liver Wo Contrast  Result Date: 08/20/2016 CLINICAL DATA:  Breast cancer with bone and liver metastases. EXAM: MRI ABDOMEN WITHOUT CONTRAST TECHNIQUE: Multiplanar multisequence MR imaging was performed without the administration of intravenous contrast. COMPARISON:  06/04/2011 FINDINGS: Lower chest: No pleural fluid. Postoperative findings in right breast including diffuse edema and small fluid collection appear similar to previous exam. Hepatobiliary: Multifocal liver metastases are again identified. Index lesion within posterior right lobe of liver measures 3.5 x 5.0 cm, image 21 of series 4. On the previous exam this measured 4.9 x 6.0 cm. The index lesion within the lateral segment of left lobe of liver measures 5.0 x 3.3 cm, image 15 of series 4. This is compared with 5.0 x 3.3 cm previously. Index lesion within left lobe of liver measures 1.5 x 1.4 cm, image 18 of series 4. Previously 1.7 x 1.3 cm. Pancreas: No mass, inflammatory  changes, or other parenchymal abnormality identified. Spleen:  Within normal limits in size and appearance. Adrenals/Urinary Tract: No masses identified. No evidence of hydronephrosis. Stomach/Bowel: Visualized portions within the abdomen are unremarkable. Vascular/Lymphatic: No pathologically enlarged lymph nodes identified. No abdominal aortic aneurysm demonstrated. Other:  No free fluid or fluid collections identified Musculoskeletal: Mild lumbar spondylosis and degenerative disc disease. IMPRESSION: 1. Taking into account respiratory motion artifact on previous exam, the overall burden of hepatic metastasis is stable. 2. Lumbar spondylosis and degenerative disc disease. Electronically Signed   By: Kerby Moors M.D.   On: 08/20/2016 14:29    Christy Sollers, DO  [facility] Pager [phone]  If 7PM-7AM, please contact night-coverage [email or web address] Password [email or web address] 08/29/2016, 10:06 AM   LOS: 0 days

## 2016-08-29 NOTE — Progress Notes (Addendum)
ANTICOAGULATION CONSULT NOTE - Initial Consult  Pharmacy Consult for Eliquis Indication: new DVT  Allergies  Allergen Reactions  . Bydureon [Exenatide] Rash  . Gadolinium Derivatives Other (See Comments)    Pt began having chest numbness/tingling , stated her heart felt funny, had to take her to ED for EKG per RN   CAP  . Enalapril Rash    Patient Measurements: Height: 5' (152.4 cm) Weight: 114 lb 3.2 oz (51.8 kg) IBW/kg (Calculated) : 45.5 Heparin Dosing Weight:   Vital Signs: Temp: 97.5 F (36.4 C) (01/20 0407) Temp Source: Oral (01/20 0407) BP: 127/60 (01/20 0407) Pulse Rate: 57 (01/20 0407)  Labs:  Recent Labs  08/28/16 1659 08/29/16 0649  HGB 12.4 11.3*  HCT 36.8 34.4*  PLT 113* 96*  CREATININE 0.65 0.44    Estimated Creatinine Clearance: 43.6 mL/min (by C-G formula based on SCr of 0.44 mg/dL).   Medical History: Past Medical History:  Diagnosis Date  . Arthritis   . Cancer ([place])    right breast  . Club foot    chronic limp  . Diabetes mellitus    metformin  . GERD (gastroesophageal reflux disease)    watches diet  . History of radiation therapy 02/22/13- 03/13/13   left proximal humerus 3500 cGy 14 sessions  . Hyperlipidemia   . Hypertension   . Lymphedema    right arm  . Metastasis from malignant tumor of breast ([facility])    left humerous  . Metastasis from malignant tumor of breast ([place])    liver  . Neuromuscular disorder ([facility])    lt arm numb sometimes  . Osteoporosis   . Thyroid disease   . Wears glasses     Assessment: Patient is a 76 y.o F with metastatic breast cancer s/p treatment with  eribulin, presented to the ED on 08/28/16 with AMS.  LE doppler on 1/20 showed extensive left leg DVT.  To start Eliquis for new DVT.   Plan:  - d/c lovenox 40 mg -  Eliquis 10 mg PO bid x7 days, then 5 mg bid - monitor for s/s bleeding  Adden: attempted to educate on 08/29/16 but she is confused.  Will try to educate again when family is  present  Allesandra Huebsch P 08/29/2016,11:52 AM

## 2016-08-29 NOTE — Progress Notes (Signed)
Preliminary results by tech -  The IVC and right common and external iliac veins appeared patent without evidence of thrombus.The left common iliac and external iliac veins demonstrated acute deep vein thrombosis.  Results given to patient's nurse, Gre[place].  Oda Cogan, BS, RDMS, RVT

## 2016-08-29 NOTE — Progress Notes (Signed)
Preliminary results by tech - Venous Duplex Lower Ext. Completed. Left Leg, positive for extensive acute deep vein thrombosis involving the left common and external iliac veins,  common femoral vein, femoral vein, popliteal vein, gastro veins and calf veins. Per protocol the IVC/iliac veins were imaged.  Right leg, negative for deep and superficial vein thrombosis. The IVC and right common and external iliac veins appeared patent without evidence of thrombus. Results given to patient's nurse, Grethren.  Oda Cogan, BS, RDMS, RVT

## 2016-08-30 DIAGNOSIS — I82412 Acute embolism and thrombosis of left femoral vein: Secondary | ICD-10-CM

## 2016-08-30 DIAGNOSIS — M8708 Idiopathic aseptic necrosis of bone, other site: Secondary | ICD-10-CM

## 2016-08-30 DIAGNOSIS — M87180 Osteonecrosis due to drugs, jaw: Secondary | ICD-10-CM

## 2016-08-30 DIAGNOSIS — E1165 Type 2 diabetes mellitus with hyperglycemia: Secondary | ICD-10-CM

## 2016-08-30 DIAGNOSIS — E039 Hypothyroidism, unspecified: Secondary | ICD-10-CM

## 2016-08-30 DIAGNOSIS — E1140 Type 2 diabetes mellitus with diabetic neuropathy, unspecified: Secondary | ICD-10-CM

## 2016-08-30 DIAGNOSIS — E1142 Type 2 diabetes mellitus with diabetic polyneuropathy: Secondary | ICD-10-CM

## 2016-08-30 DIAGNOSIS — I82402 Acute embolism and thrombosis of unspecified deep veins of left lower extremity: Secondary | ICD-10-CM

## 2016-08-30 DIAGNOSIS — T50904A Poisoning by unspecified drugs, medicaments and biological substances, undetermined, initial encounter: Secondary | ICD-10-CM

## 2016-08-30 DIAGNOSIS — E1149 Type 2 diabetes mellitus with other diabetic neurological complication: Secondary | ICD-10-CM

## 2016-08-30 LAB — HIV ANTIBODY (ROUTINE TESTING W REFLEX): HIV Screen 4th Generation wRfx: NONREACTIVE

## 2016-08-30 LAB — HEMOGLOBIN A1C
Hgb A1c MFr Bld: 4.8 % (ref 4.8–5.6)
MEAN PLASMA GLUCOSE: 91 mg/dL

## 2016-08-30 LAB — URINE CULTURE: CULTURE: NO GROWTH

## 2016-08-30 LAB — BASIC METABOLIC PANEL
Anion gap: 6 (ref 5–15)
BUN: 24 mg/dL — ABNORMAL HIGH (ref 6–20)
CO2: 25 mmol/L (ref 22–32)
Calcium: 8.2 mg/dL — ABNORMAL LOW (ref 8.9–10.3)
Chloride: 110 mmol/L (ref 101–111)
Creatinine, Ser: 0.53 mg/dL (ref 0.44–1.00)
GFR calc Af Amer: >60 mL/min (ref >60)
GFR calc non Af Amer: >60 mL/min (ref >60)
Glucose, Bld: 107 mg/dL — ABNORMAL HIGH (ref 65–99)
Potassium: 3.7 mmol/L (ref 3.5–5.1)
Sodium: 141 mmol/L (ref 135–145)

## 2016-08-30 LAB — CBC
HCT: 31.8 % — ABNORMAL LOW (ref 36.0–46.0)
Hemoglobin: 10.3 g/dL — ABNORMAL LOW (ref 12.0–15.0)
MCH: 29.4 pg (ref 26.0–34.0)
MCHC: 32.4 g/dL (ref 30.0–36.0)
MCV: 90.9 fL (ref 78.0–100.0)
Platelets: 104 10*3/uL — ABNORMAL LOW (ref 150–400)
RBC: 3.5 MIL/uL — ABNORMAL LOW (ref 3.87–5.11)
RDW: 19.6 % — ABNORMAL HIGH (ref 11.5–15.5)
WBC: 4.8 10*3/uL (ref 4.0–10.5)

## 2016-08-30 LAB — GLUCOSE, CAPILLARY
GLUCOSE-CAPILLARY: 98 mg/dL (ref 65–99)
Glucose-Capillary: 101 mg/dL — ABNORMAL HIGH (ref 65–99)

## 2016-08-30 LAB — RPR: RPR Ser Ql: NONREACTIVE

## 2016-08-30 LAB — MAGNESIUM: Magnesium: 2 mg/dL (ref 1.7–2.4)

## 2016-08-30 MED ORDER — ENSURE ENLIVE PO LIQD
237.0000 mL | Freq: Three times a day (TID) | ORAL | Status: DC
  Administered 2016-08-30 – 2016-09-18 (×41): 237 mL via ORAL

## 2016-08-30 MED ORDER — SODIUM CHLORIDE 0.9 % IV SOLN: INTRAVENOUS | Status: DC

## 2016-08-30 MED ORDER — POTASSIUM CHLORIDE IN NACL 20-0.9 MEQ/L-% IV SOLN
INTRAVENOUS | Status: DC
  Administered 2016-08-30 – 2016-09-04 (×7): via INTRAVENOUS
  Filled 2016-08-30 (×13): qty 1000

## 2016-08-30 MED ORDER — AMOXICILLIN-POT CLAVULANATE 875-125 MG PO TABS
1.0000 | ORAL_TABLET | Freq: Two times a day (BID) | ORAL | Status: AC
  Administered 2016-08-30 – 2016-09-04 (×12): 1 via ORAL
  Filled 2016-08-30 (×12): qty 1

## 2016-08-30 NOTE — Progress Notes (Signed)
PT Cancellation Note  Patient Details Name: Christy Harrell MRN: [id number] DOB: 19-Apr-1941   Cancelled Treatment:    Reason Eval/Treat Not Completed: Medical issues which prohibited therapy (patient to transfer to [facility] per neurology request.)   Claretha Cooper 08/30/2016, 3:00 PM

## 2016-08-30 NOTE — Progress Notes (Signed)
Bladder scan yielded 318 ml. Pt. did not void.  PCP notified

## 2016-08-30 NOTE — Progress Notes (Signed)
Bladder scanned 183 ml.Pt. Did not void PCP notified.

## 2016-08-30 NOTE — Progress Notes (Signed)
Initial Nutrition Assessment  DOCUMENTATION CODES:   Severe malnutrition in context of chronic illness  INTERVENTION:   Provide Ensure Enlive po TID, each supplement provides 350 kcal and 20 grams of protein Encourage PO intake RD to continue to monitor  NUTRITION DIAGNOSIS:   Malnutrition related to chronic illness as evidenced by percent weight loss, severe depletion of body fat, severe depletion of muscle mass.  GOAL:   Patient will meet greater than or equal to 90% of their needs  MONITOR:   PO intake, Supplement acceptance, Labs, Weight trends, I & O's  REASON FOR ASSESSMENT:   Consult Assessment of nutrition requirement/status  ASSESSMENT:   76 year old female with a history of metastatic breast cancer, hypertension, diabetes mellitus, hyperlipidemia presenting with altered mental status. The patient is poor historian.  Much of the history is provided through the patient's daughter who lives in [place]. Apparently, the patient's daughter noted that the patient was sleepy and repeating herself on the phone on the evening of 08/26/2016. On the morning of 08/27/2016, the patient's cousin was unable to reach the patient, and so the neighbor to check up on the patient. The patient was found sitting on the commode and unable to get up and walk. EMS was activated, and the patient was transferred to sit in a chair and that done. She was noted to be hypotensive at that time and given 500 mL bolus of fluid. The patient refused transfer to the hospital. On the morning of 08/28/2016, the patient's cousin went to check up on the patient again, and the patient was noted not to have moved from the chair in the den from the previous day. Apparently, the patient was confused and noted to be incontinent of urine and stool.  Patient in room with no family at bedside. Per patient, her daughter lives in [place], [place]. Pt was unable to provide much information. Pt slow to respond to questions  with multiple pauses and "umms". Per chart review, pt has been refusing breakfast this morning. When asked if she would like lunch, she states "I don't know what I want". SLP evaluated and recommends dysphagia 2 diet d/t mastication issues. Pt with osteonecrosis of jaw. Pt is willing to try Ensure supplements. RD will order.  Per chart review, pt has lost 55 lb since 02/18/16 (33% wt loss x 6 months, significant for time frame). Nutrition-Focused physical exam completed. Findings are severe fat depletion, severe muscle depletion, and RUE edema.   Labs reviewed. Medications: K-DUR tablet BID  Diet Order:  DIET DYS 2 Room service appropriate? Yes; Fluid consistency: Thin  Skin:  Reviewed, no issues  Last BM:  1/20  Height:   Ht Readings from Last 1 Encounters:  08/29/16 5' (1.524 m)    Weight:   Wt Readings from Last 1 Encounters:  08/29/16 114 lb 3.2 oz (51.8 kg)    Ideal Body Weight:  45.5 kg  BMI:  Body mass index is 22.3 kg/m.  Estimated Nutritional Needs:   Kcal:  1400-1600  Protein:  65-75g  Fluid:  1.6L/day  EDUCATION NEEDS:   No education needs identified at this time  Christy Bibles, MS, RD, LDN Pager: [phone] After Hours Pager: [phone]

## 2016-08-30 NOTE — Progress Notes (Signed)
PROGRESS NOTE  Christy Harrell [id number] DOB: 1941-07-21 DOA: 08/28/2016 PCP: [name], MD Brief History:  76 year old female with a history of metastatic breast cancer, hypertension, diabetes mellitus, hyperlipidemia presenting with altered mental status. The patient is poor historian.  Much of the history is provided through the patient's daughter who lives in [place]. Apparently, the patient's daughter noted that the patient was sleepy and repeating herself on the phone on the evening of 08/26/2016. On the morning of 08/27/2016, the patient's cousin was unable to reach the patient, and so the neighbor to check up on the patient. The patient was found sitting on the commode and unable to get up and walk. EMS was activated, and the patient was transferred to sit in a chair and that done. She was noted to be hypotensive at that time and given 500 mL bolus of fluid. The patient refused transfer to the hospital. On the morning of 08/28/2016, the patient's cousin went to check up on the patient again, and the patient was noted not to have moved from the chair in the den from the previous day. Apparently, the patient was confused and noted to be incontinent of urine and stool. EMS was activated, and the patient was brought to the emergency room for further evaluation. The patient's daughter states that the patient has had a gradual functional decline since August 2017. In emergency department, the patient was noted to be hypokalemic. Further workup revealed WBC 11.6. Urinalysis was negative for pyuria. Ammonia was 26.  Assessment/Plan: Acute metabolic encephalopathy -TSH 2.106 -Ammonia 26 -Check serum B12--1645 -RPR--neg -MRI brain--neg for metastasis -Dehydration and volume depletion contributing to her encephalopathy -Urinalysis negative for pyuria -08/28/16--CT brain neg -personally reviewed EKG--unchanged RBBB, LAFB -consulted neurology--spoke with Dr.  Aleda Grana transfer to [facility] -concern about possible paraneoplastic syndrome causing confusion  Failure to thrive -Consult nutrition -PT evaluation -Suspect she will need skilled nursing facility  Dehydration -The patient presented with hemoconcentration and elevated BUN above her normal baseline -Continue IV fluids  Metastatic breast cancer -Initially diagnosed in March 2012 -Metastasis noted initially May 2014--metastasis to bone and liver -Has had history of pathologic fracture to the humerus -last dose of eribulin 07/07/16 -follows Dr. [name]  Osteonecrosis of the jaw -per Dr. Wyvonne Lenz was biopsied and sent to [facility] with result read as osteonecrosis of the jaw, no cancer or evidence of infection noted -08/28/2016 CT neck right mandible with large bony sequestrum -start Amox/Clav that was ordered by her oral surgeon but pt did not fill Rx  Hypokalemia -Repleted -Check magnesium--2.0  Thrombocytopenia -likely due to acute DVT -Serum B12--1645 -TSH 2.106 -INR--1.25 -PTT-38  Acute DVT Left Lower Extremity -venous duplex--+DVT left common iliac and left external iliac -started apixaban  DM2 -check A1C--pending -allow liberal control -d/c sliding scale insulin  Hyperlipidemia -continue statin  Dysphagia -evaluated by speech-->dys 2 with thin liquids    Disposition Plan:   Transfer to [facility]  Family Communication:   Daughter updated on phone--Total time spent 35 minutes.  Greater than 50% spent face to face counseling and coordinating care.   Consultants:  none  Code Status:  DNR  DVT Prophylaxis:  apixaban   Procedures: As Listed in Progress Note Above  Antibiotics: None     Subjective: Patient is encephalopathy. She denies any headache, chest pain, breath, abdominal pain, leg pain. No reports of vomiting or respiratory distress. No diarrhea reported.  Objective: Vitals:   08/29/16 0407  08/29/16 1331  08/29/16 2106 08/30/16 0540  BP: 127/60 (!) 121/59 (!) 98/58 95/63  Pulse: (!) 57 (!) 54 61 62  Resp: 16 20 20 20   Temp: 97.5 F (36.4 C) 98.1 F (36.7 C) 98.7 F (37.1 C) 99.7 F (37.6 C)  TempSrc: Oral Oral Oral Oral  SpO2: 100%  100% 100%  Weight:      Height:        Intake/Output Summary (Last 24 hours) at 08/30/16 1218 Last data filed at 08/30/16 [id number]  Gross per 24 hour  Intake          1661.25 ml  Output                0 ml  Net          1661.25 ml   Weight change:  Exam:   General:  Pt is alert, follows commands appropriately, not in acute distress-A&O x 1  HEENT: No icterus, No thrush, No neck mass, [place]/AT  Cardiovascular: RRR, S1/S2, no rubs, no gallops  Respiratory: CTA bilaterally, no wheezing, no crackles, no rhonchi  Abdomen: Soft/+BS, non tender, non distended, no guarding  Extremities: 1 + LE edema, No lymphangitis, No petechiae, No rashes, no synovitis   Data Reviewed: I have personally reviewed following labs and imaging studies Basic Metabolic Panel:  Recent Labs Lab 08/28/16 1659 08/29/16 0649 08/30/16 0356  NA 141 144 141  K 2.6* 3.9 3.7  CL 103 109 110  CO2 23 26 25   GLUCOSE 137* 109* 107*  BUN 36* 30* 24*  CREATININE 0.65 0.44 0.53  CALCIUM 8.8* 8.4* 8.2*  MG  --   --  2.0   Liver Function Tests:  Recent Labs Lab 08/28/16 1659  AST 23  ALT 11*  ALKPHOS 72  BILITOT 2.0*  PROT 7.1  ALBUMIN 3.5   No results for input(s): LIPASE, AMYLASE in the last 168 hours.  Recent Labs Lab 08/28/16 1659  AMMONIA 26   Coagulation Profile:  Recent Labs Lab 08/29/16 1110  INR 1.25   CBC:  Recent Labs Lab 08/28/16 1659 08/29/16 0649 08/30/16 0356  WBC 5.0 4.6 4.8  HGB 12.4 11.3* 10.3*  HCT 36.8 34.4* 31.8*  MCV 88.9 88.9 90.9  PLT 113* 96* 104*   Cardiac Enzymes: No results for input(s): CKTOTAL, CKMB, CKMBINDEX, TROPONINI in the last 168 hours. BNP: Invalid input(s): POCBNP CBG:  Recent Labs Lab 08/29/16 1146  08/29/16 1648 08/29/16 2114 08/30/16 0748 08/30/16 1135  GLUCAP 98 123* 86 98 101*   HbA1C: No results for input(s): HGBA1C in the last 72 hours. Urine analysis:    Component Value Date/Time   COLORURINE AMBER (A) 08/28/2016 2352   APPEARANCEUR CLEAR 08/28/2016 2352   LABSPEC >1.046 (H) 08/28/2016 2352   PHURINE 6.0 08/28/2016 2352   GLUCOSEU NEGATIVE 08/28/2016 2352   GLUCOSEU NEGATIVE 10/19/2014 1246   HGBUR SMALL (A) 08/28/2016 2352   BILIRUBINUR NEGATIVE 08/28/2016 2352   KETONESUR 20 (A) 08/28/2016 2352   PROTEINUR 30 (A) 08/28/2016 2352   UROBILINOGEN 1.0 10/19/2014 1246   NITRITE NEGATIVE 08/28/2016 2352   LEUKOCYTESUR NEGATIVE 08/28/2016 2352   Sepsis Labs: @LABRCNTIP (procalcitonin:4,lacticidven:4) ) Recent Results (from the past 240 hour(s))  Urine culture     Status: None   Collection Time: 08/29/16 12:41 AM  Result Value Ref Range Status   Specimen Description URINE, RANDOM  Final   Special Requests NONE  Final   Culture   Final    NO GROWTH Performed at [facility], [place] [address] [place],  [address]    Report Status 08/30/2016 FINAL  Final     Scheduled Meds: . apixaban  10 mg Oral BID   Followed by  . [START ON 09/05/2016] apixaban  5 mg Oral BID  . aspirin EC  81 mg Oral Daily  . atorvastatin  80 mg Oral Daily  . chlorhexidine  15 mL Mouth Rinse BID  . insulin aspart  0-9 Units Subcutaneous TID WC  . levothyroxine  175 mcg Oral QAC breakfast  . mouth rinse  15 mL Mouth Rinse q12n4p  . potassium chloride  10 mEq Oral BID   Continuous Infusions: . lactated ringers 75 mL/hr at 08/30/16 [id number]    Procedures/Studies: Ct Head Wo Contrast  Result Date: 08/28/2016 CLINICAL DATA:  Altered mental status EXAM: CT HEAD WITHOUT CONTRAST TECHNIQUE: Contiguous axial images were obtained from the base of the skull through the vertex without intravenous contrast. COMPARISON:  10/04/2015 FINDINGS: Brain: No intracranial hemorrhage, mass effect or  midline shift. Mild cerebral atrophy. Mild periventricular chronic white matter disease. No acute cortical infarction. No mass lesion is noted on this unenhanced scan. Vascular: Atherosclerotic calcifications of carotid siphon are noted. Skull: No skull fracture. Sinuses/Orbits: No acute findings Other: None IMPRESSION: No acute intracranial abnormality. Mild cerebral atrophy. Mild periventricular probable chronic white matter disease. No acute cortical infarction. Electronically Signed   By: Lahoma Crocker M.D.   On: 08/28/2016 16:50   Ct Soft Tissue Neck W Contrast  Result Date: 08/28/2016 CLINICAL DATA:  Bleeding in mouth and throat. Severe halitosis. History of diabetes, metastatic breast cancer. EXAM: CT NECK WITH CONTRAST TECHNIQUE: Multidetector CT imaging of the neck was performed using the standard protocol following the bolus administration of intravenous contrast. CONTRAST:  77mL ISOVUE-300 IOPAMIDOL (ISOVUE-300) INJECTION 61% COMPARISON:  CT neck Dec 26, 2015 FINDINGS: Pharynx and larynx: Normal. Salivary glands: Atrophic and nonacute. Thyroid: Status post thyroidectomy. Lymph nodes: No lymphadenopathy by CT size criteria. Vascular: Mild calcific atherosclerosis of the carotid bifurcations. Limited intracranial: Normal. Visualized orbits: Normal. Mastoids and visualized paranasal sinuses: Small bilateral maxillary mucosal retention cysts without paranasal sinus air-fluid levels. Mastoid air cells are well aerated. Skeleton: Large bony fragment RIGHT mandible body superior aspect with erosive margins. Interval loss of multiple RIGHT maxillary teeth. Severe C5-6 and moderate C6-7 degenerative discs. Multilevel severe facet arthropathy. No destructive bony lesions. Dental malocclusion Upper chest: LEFT chest Port-A-Cath. Lung apices are clear. No superior mediastinal lymphadenopathy. Other: None. IMPRESSION: Interval loss of multiple RIGHT mandible teeth. RIGHT mandible probable osteomyelitis with large  bony sequestrum, less likely postprocedural fracture nonunion. Electronically Signed   By: Elon Alas M.D.   On: 08/28/2016 19:03   Mr Brain Wo Contrast  Result Date: 08/29/2016 CLINICAL DATA:  History of metastatic breast cancer. Acute presentation with altered mental status. Gait disturbance. Lethargy. EXAM: MRI HEAD WITHOUT CONTRAST TECHNIQUE: Multiplanar, multiecho pulse sequences of the brain and surrounding structures were obtained without intravenous contrast. COMPARISON:  Head CT 08/28/2016 FINDINGS: Brain: Diffusion imaging does not show any acute or subacute infarction or other cause of restricted diffusion. There is generalized brain atrophy with mild small vessel changes of the deep white matter. No cortical or large vessel territory infarction. No evidence of mass lesion, hemorrhage, hydrocephalus or extra-axial collection. No pituitary mass. Vascular: Major vessels at the base of the brain show flow. Skull and upper cervical spine: Negative Sinuses/Orbits: Clear/normal Other: None significant IMPRESSION: No evidence of metastatic disease or other  acute process. Age related atrophy with mild small vessel change of the deep white matter. Electronically Signed   By: Nelson Chimes M.D.   On: 08/29/2016 13:04   Mr Liver Wo Contrast  Result Date: 08/20/2016 CLINICAL DATA:  Breast cancer with bone and liver metastases. EXAM: MRI ABDOMEN WITHOUT CONTRAST TECHNIQUE: Multiplanar multisequence MR imaging was performed without the administration of intravenous contrast. COMPARISON:  06/04/2011 FINDINGS: Lower chest: No pleural fluid. Postoperative findings in right breast including diffuse edema and small fluid collection appear similar to previous exam. Hepatobiliary: Multifocal liver metastases are again identified. Index lesion within posterior right lobe of liver measures 3.5 x 5.0 cm, image 21 of series 4. On the previous exam this measured 4.9 x 6.0 cm. The index lesion within the lateral  segment of left lobe of liver measures 5.0 x 3.3 cm, image 15 of series 4. This is compared with 5.0 x 3.3 cm previously. Index lesion within left lobe of liver measures 1.5 x 1.4 cm, image 18 of series 4. Previously 1.7 x 1.3 cm. Pancreas: No mass, inflammatory changes, or other parenchymal abnormality identified. Spleen:  Within normal limits in size and appearance. Adrenals/Urinary Tract: No masses identified. No evidence of hydronephrosis. Stomach/Bowel: Visualized portions within the abdomen are unremarkable. Vascular/Lymphatic: No pathologically enlarged lymph nodes identified. No abdominal aortic aneurysm demonstrated. Other:  No free fluid or fluid collections identified Musculoskeletal: Mild lumbar spondylosis and degenerative disc disease. IMPRESSION: 1. Taking into account respiratory motion artifact on previous exam, the overall burden of hepatic metastasis is stable. 2. Lumbar spondylosis and degenerative disc disease. Electronically Signed   By: Kerby Moors M.D.   On: 08/20/2016 14:29    Nyan Dufresne, DO  [facility] Pager [phone]  If 7PM-7AM, please contact night-coverage [email or web address] Password [email or web address] 08/30/2016, 12:18 PM   LOS: 1 day

## 2016-08-30 NOTE — Progress Notes (Signed)
PCP on call notified the RN not to place a catheter at this time

## 2016-08-30 NOTE — Progress Notes (Signed)
Patient to tranfer to [facility] per [facility], report given to  Judson Roch, report called to [facility] RN on receiving unit

## 2016-08-31 ENCOUNTER — Telehealth: Payer: Self-pay | Admitting: *Deleted

## 2016-08-31 ENCOUNTER — Other Ambulatory Visit: Payer: Self-pay | Admitting: *Deleted

## 2016-08-31 DIAGNOSIS — I890 Lymphedema, not elsewhere classified: Secondary | ICD-10-CM

## 2016-08-31 DIAGNOSIS — C7951 Secondary malignant neoplasm of bone: Secondary | ICD-10-CM

## 2016-08-31 DIAGNOSIS — C50512 Malignant neoplasm of lower-outer quadrant of left female breast: Secondary | ICD-10-CM

## 2016-08-31 DIAGNOSIS — Z7901 Long term (current) use of anticoagulants: Secondary | ICD-10-CM

## 2016-08-31 DIAGNOSIS — I82412 Acute embolism and thrombosis of left femoral vein: Secondary | ICD-10-CM

## 2016-08-31 DIAGNOSIS — C787 Secondary malignant neoplasm of liver and intrahepatic bile duct: Secondary | ICD-10-CM

## 2016-08-31 NOTE — Progress Notes (Signed)
Christy Harrell   DOB:[birth year]   SU#:[id number]   KFE#:[id number]  Subjective: dropped by to see patient yesterday--she was unable to respond verbally--continued to make mouth motions most likely indicating continuing discomfort/pain in her mouth following recent dental surgery; no family in room   Objective: elderly African American woman examined in bed Vitals:   08/30/16 2124 08/31/16 0529  BP: 119/62 (!) 103/58  Pulse: 71 62  Resp: 18 18  Temp: 97.6 F (36.4 C) 98.8 F (37.1 C)    Body mass index is 22.3 kg/m.  Intake/Output Summary (Last 24 hours) at 08/31/16 0704 Last data filed at 08/31/16 0458  Gross per 24 hour  Intake          1086.25 ml  Output                0 ml  Net          1086.25 ml    Lungs no rales or wheezes--auscultated anterolaterally Heart RRR-- Did not follow one step commands    CBG (last 3)   Recent Labs  08/29/16 2114 08/30/16 0748 08/30/16 1135  GLUCAP 86 98 101*     Labs:  Lab Results  Component Value Date   WBC 4.8 08/30/2016   HGB 10.3 (L) 08/30/2016   HCT 31.8 (L) 08/30/2016   MCV 90.9 08/30/2016   PLT 104 (L) 08/30/2016   NEUTROABS 8.8 (H) 07/23/2016    _0 @  Urine Studies No results for input(s): UHGB, CRYS in the last 72 hours.  Invalid input(s): UACOL, UAPR, USPG, UPH, UTP, UGL, UKET, UBIL, UNIT, UROB, ULEU, UEPI, UWBC, URBC, UBAC, CAST, [place], [place]  Basic Metabolic Panel:  Recent Labs Lab 08/28/16 1659 08/29/16 0649 08/30/16 0356  NA 141 144 141  K 2.6* 3.9 3.7  CL 103 109 110  CO2 _1 GLUCOSE 137* 109* 107*  BUN 36* 30* 24*  CREATININE 0.65 0.44 0.53  CALCIUM 8.8* 8.4* 8.2*  MG  --   --  2.0   GFR Estimated Creatinine Clearance: 43.6 mL/min (by C-G formula based on SCr of 0.53 mg/dL). Liver Function Tests:  Recent Labs Lab 08/28/16 1659  AST 23  ALT 11*  ALKPHOS 72  BILITOT 2.0*  PROT 7.1  ALBUMIN 3.5   No results for input(s): LIPASE, AMYLASE in the last 168  hours.  Recent Labs Lab 08/28/16 1659  AMMONIA 26   Coagulation profile  Recent Labs Lab 08/29/16 1110  INR 1.25    CBC:  Recent Labs Lab 08/28/16 1659 08/29/16 0649 08/30/16 0356  WBC 5.0 4.6 4.8  HGB 12.4 11.3* 10.3*  HCT 36.8 34.4* 31.8*  MCV 88.9 88.9 90.9  PLT 113* 96* 104*   Cardiac Enzymes: No results for input(s): CKTOTAL, CKMB, CKMBINDEX, TROPONINI in the last 168 hours. BNP: Invalid input(s): POCBNP CBG:  Recent Labs Lab 08/29/16 1146 08/29/16 1648 08/29/16 2114 08/30/16 0748 08/30/16 1135  GLUCAP 98 123* 86 98 101*   D-Dimer No results for input(s): DDIMER in the last 72 hours. Hgb A1c  Recent Labs  08/29/16 1110  HGBA1C 4.8   Lipid Profile No results for input(s): CHOL, HDL, LDLCALC, TRIG, CHOLHDL, LDLDIRECT in the last 72 hours. Thyroid function studies  Recent Labs  08/29/16 0649  TSH 2.106   Anemia work up  Recent Labs  08/29/16 [place] 1,645*   Microbiology Recent Results (from the past 240 hour(s))  Urine culture     Status: None   Collection Time: 08/29/16  12:41 AM  Result Value Ref Range Status   Specimen Description URINE, RANDOM  Final   Special Requests NONE  Final   Culture   Final    NO GROWTH Performed at [facility], [address] [place], [place] [address]    Report Status 08/30/2016 FINAL  Final      Studies:  Mr Brain Wo Contrast  Result Date: 08/29/2016 CLINICAL DATA:  History of metastatic breast cancer. Acute presentation with altered mental status. Gait disturbance. Lethargy. EXAM: MRI HEAD WITHOUT CONTRAST TECHNIQUE: Multiplanar, multiecho pulse sequences of the brain and surrounding structures were obtained without intravenous contrast. COMPARISON:  Head CT 08/28/2016 FINDINGS: Brain: Diffusion imaging does not show any acute or subacute infarction or other cause of restricted diffusion. There is generalized brain atrophy with mild small vessel changes of the deep white matter.  No cortical or large vessel territory infarction. No evidence of mass lesion, hemorrhage, hydrocephalus or extra-axial collection. No pituitary mass. Vascular: Major vessels at the base of the brain show flow. Skull and upper cervical spine: Negative Sinuses/Orbits: Clear/normal Other: None significant IMPRESSION: No evidence of metastatic disease or other acute process. Age related atrophy with mild small vessel change of the deep white matter. Electronically Signed   By: Nelson Chimes M.D.   On: 08/29/2016 13:04    Assessment: 76 y.o. [place] woman with stage IV breast cancer as of May 2014  (1)  status post right lower outer quadrant lumpectomy and axillary lymph node dissection 11/17/2010 for a pT2 pN1, stage IIB invasive ductal carcinoma, grade 2, estrogen and progesterone receptor positive, HER-2 negative,   (2) Oncotype DX recurrence score of 27 predicting a risk of distant recurrence of 18% with 5 years of tamoxifen (intermediate score);   (3)  status post radiation completed July of 2012,   (4) started anastrozole July 2012, discontinued June 2014 with evidence of metastatic spread to bone  (5) Chronic lymphedema in the right upper extremity.  (6) pathologic fracture of the left humerus along apparent lytic lesion, with no other bone lesions per bone scan 12/12/2012  (7) on 12/27/2012 underwent #1 intramedullary nail left pathologic proximal humerus fracture  #2 left shoulder rotator cuff repair  #3 left shoulder open bone biopsy with pathology showing metastatic breast adenocarcinoma, estrogen receptor 100% positive, progesterone receptor and HER-2 negative, with an MIB-1 of 26%. PET scan 01/13/2013 showed no other areas of disease   (8) status post 35 Gy to the left proximal humerus completed 03/13/2013(  (9) fulvestrant, started on 01/23/2013, stopped May 2017 with progression  (10) zolendronic acid given monthly, first dose 01/23/2013; changed to denosumab as of  August 2015 because of access problems, being given every three months             (a) May 2017 dose and subsequent treatments held after several extractions--on hold pending dental clearance  (11) multiple liver lesions noted on scans April 2015              (12) letrozole added May 2015, stopped 04/18/14 because of progression in her liver noted on a chest CT performed on 03/14/14  (13) everolimus and exemestane started 04/19/14, stopped May 2017             (a) MRI of the abdomen 07/12/15 showed stable disease             (b) repeat abdominal MRI 12/04/2015 showed progression  (14) started CMF chemotherapy (cyclophosphamide, methotrexate, fluorouracil) 12/24/2015             (  a) admitted 02/18/2016 with febrile neutropenia, negative cultures             (b) axillofacial CT scan 02/19/2016 shows no soft-tissue abscessbut lucencies in the roots of multiple upper and lower teeth may indicate periapical abscesses             (c) MRI of the liver 02/26/2016 shows disease progression: CMF discontinued  (15) started eribulin 03/31/2016, to be repeated days 1 and 8 of each cycle             (a) cycle 3 delayed one week because of malaise             (b) treatments switched to every 14 days beginning with cycle 4   (16) Left lower extremity DVT documented by doppler 08/29/2016-- on apixaban     Plan:  Appreciate neurology's continuing evaluation of Thessie's encephalopathy. From a breast cancer point of view she has been receiving eribulin, last does 07/23/2017-- clearly that does not explain her current performance status. MRI of the liver (where she has measurable disease) 08/20/2016 was stable, and her LFTs are unremarkable, with normal ammonia level. [facility] Kitchen MRI of the brain 08/29/2016 showed no mets but was w/o contrast so could miss subtle meningeal carcinomatosis.  Consider change to IV heparin temporarily if LP planned.  Will follow with you.   Chauncey Cruel, MD 08/31/2016  7:04  AM Medical Oncology and Hematology [facility] [address] [place], [place] [address] Tel. [phone]    Fax. [phone]

## 2016-08-31 NOTE — Evaluation (Signed)
Physical Therapy Evaluation Patient Details Name: Christy Harrell MRN: [id number] DOB: [birth year] Today's Date: 08/31/2016   History of Present Illness  Christy Harrell is a 76 y.o. female with medical history significant of hypothyroidism, metastatic breat cancer undergoing chemotherapy, HTN, HLD, and DM presenting with AMS. Positive for extensive acute deep vein thrombosis involving the left common and external iliac veins,  common femoral vein, femoral vein, popliteal vein, gastro veins and calf veins.  Clinical Impression  Pt admitted with above diagnosis. Pt currently with functional limitations due to the deficits listed below (see PT Problem List). At the time of PT eval pt was able to perform transfers with +2 assist for balance support and safety. Pt not oriented during session and was not able to provide information for home environment. At this time feel SNF is the safest option for pt until she can improve independence and safety for return to her home. Pt will benefit from skilled PT to increase their independence and safety with mobility to allow discharge to the venue listed below.       Follow Up Recommendations SNF;Supervision/Assistance - 24 hour    Equipment Recommendations  Other (comment) (TBD by next venue of care. Unsure what pt has at home)    Recommendations for Other Services       Precautions / Restrictions Precautions Precautions: Fall Restrictions Weight Bearing Restrictions: No      Mobility  Bed Mobility Overal bed mobility: Needs Assistance Bed Mobility: Supine to Sit     Supine to sit: Max assist;+2 for physical assistance;HOB elevated     General bed mobility comments: VC's for sequencing and technique. Bed pad used for extensive assist to EOB. Pt did not make any initiating movement with cues.   Transfers Overall transfer level: Needs assistance Equipment used: 2 person hand held assist Transfers: Sit to/from [employer] Sit to Stand: Max assist;+2 physical assistance Stand pivot transfers: Max assist;+2 physical assistance       General transfer comment: VC's for hand placement on seated surface for safety. Pt did not make any movement to push off of the bed so was instead cued to hold to therapist's arm for support -  she was able to maintain this throughout transfers. Bed pad used to initiate transfer to standing and pt began participating more. Was able to take a few pivotal steps around to the chair.   Ambulation/Gait             General Gait Details: Was not able to progress gait training this session.   Stairs            Wheelchair Mobility    Modified Rankin (Stroke Patients Only)       Balance Overall balance assessment: Needs assistance Sitting-balance support: Feet supported;No upper extremity supported Sitting balance-Leahy Scale: Poor Sitting balance - Comments: Needed almost constant hands on for sitting balance without back support Postural control: Posterior lean;Left lateral lean Standing balance support: Bilateral upper extremity supported;During functional activity Standing balance-Leahy Scale: Poor Standing balance comment: +2                             Pertinent Vitals/Pain Pain Assessment: No/denies pain    Home Living Family/patient expects to be discharged to:: Private residence Living Arrangements: Alone               Additional Comments: Not able to provide details of home environment - states she lives  with her mother, and then corrects herself and states her grandmother.    Prior Function           Comments: Unsure - pt poor historian     Hand Dominance        Extremity/Trunk Assessment   Upper Extremity Assessment Upper Extremity Assessment: Defer to OT evaluation    Lower Extremity Assessment Lower Extremity Assessment: Generalized weakness    Cervical / Trunk Assessment Cervical / Trunk Assessment:  Kyphotic  Communication   Communication: Expressive difficulties  Cognition Arousal/Alertness: Awake/alert Behavior During Therapy: Flat affect Overall Cognitive Status: No family/caregiver present to determine baseline cognitive functioning                      General Comments      Exercises     Assessment/Plan    PT Assessment Patient needs continued PT services  PT Problem List Decreased strength;Decreased activity tolerance;Decreased balance;Decreased mobility;Decreased knowledge of use of DME;Decreased safety awareness;Decreased cognition;Decreased knowledge of precautions          PT Treatment Interventions DME instruction;Gait training;Stair training;Functional mobility training;Therapeutic activities;Therapeutic exercise;Neuromuscular re-education;Patient/family education    PT Goals (Current goals can be found in the Care Plan section)  Acute Rehab PT Goals PT Goal Formulation: Patient unable to participate in goal setting Time For Goal Achievement: 09/14/16 Potential to Achieve Goals: Fair    Frequency Min 2X/week   Barriers to discharge Inaccessible home environment;Decreased caregiver support      Co-evaluation               End of Session Equipment Utilized During Treatment: Gait belt Activity Tolerance: Patient tolerated treatment well Patient left: in chair;with call bell/phone within reach;with chair alarm set Nurse Communication: Mobility status         Time: [id number] PT Time Calculation (min) (ACUTE ONLY): 19 min   Charges:   PT Evaluation $PT Eval Moderate Complexity: 1 Procedure     PT G CodesThelma Comp September 24, 2016, 2:38 PM   Rolinda Roan, PT, DPT Acute Rehabilitation Services Pager: [phone]

## 2016-08-31 NOTE — Progress Notes (Addendum)
PROGRESS NOTE  Christy Harrell [id number] DOB: 03-30-1941 DOA: 08/28/2016   PCP: [name], MD   Brief History:  76 year old female with a history of metastatic breast cancer, hypertension, diabetes mellitus, hyperlipidemia presenting with altered mental status. The patient is poor historian.  Much of the history is provided through the patient's daughter who lives in [place]. Apparently, the patient's daughter noted that the patient was sleepy and repeating herself on the phone on the evening of 08/26/2016. On the morning of 08/27/2016, the patient's cousin was unable to reach the patient, and so the neighbor to check up on the patient. The patient was found sitting on the commode and unable to get up and walk. EMS was activated, and the patient was transferred to sit in a chair and that done. She was noted to be hypotensive at that time and given 500 mL bolus of fluid. The patient refused transfer to the hospital. On the morning of 08/28/2016, the patient's cousin went to check up on the patient again, and the patient was noted not to have moved from the chair in the den from the previous day. Apparently, the patient was confused and noted to be incontinent of urine and stool. EMS was activated, and the patient was brought to the emergency room for further evaluation. The patient's daughter states that the patient has had a gradual functional decline since August 2017. In emergency department, the patient was noted to be hypokalemic. Further workup revealed WBC 11.6. Urinalysis was negative for pyuria. Ammonia was 26.  Assessment/Plan: Acute metabolic encephalopathy - Dehydration and volume depletion contributing to her encephalopathy, ? Paraneoplastic syndrome  - TSH 2.106 - Ammonia 26 - serum B12--1645 - RPR--neg - MRI brain--neg for metastasis - Urinalysis negative for pyuria - 08/28/16--CT brain neg - neurology team requested transfer to [facility] but I have not seen  official consult yet, will page neurology to confirm they are aware of pt's transfer   Failure to thrive, severe PCM  - consulted nutrition, appreciate assistance   Dehydration - The patient presented with hemoconcentration and elevated BUN above her normal baseline - BUN trending down towards target range   Metastatic breast cancer - Initially diagnosed in March 2012 - Metastasis noted initially May 2014--metastasis to bone and liver - Has had history of pathologic fracture to the humerus - has been receiving eribulin, last does 07/23/2017 - appreciate Dr. [name] following   Osteonecrosis of the jaw - per Dr. Karmen Bongo, Bone was biopsied and sent to [facility] with result read as osteonecrosis of the jaw, no cancer or evidence of infection noted - 08/28/2016 CT neck right mandible with large bony sequestrum - started Amox/Clav that was ordered by her oral surgeon but pt did not fill Rx  Hypokalemia - supplemented and WNL - BMP In AM  Thrombocytopenia - likely due to acute DVT - Serum B12--1645 - TSH 2.106 - Plt overall improving   Acute DVT Left Lower Extremity - venous duplex--+DVT left common iliac and left external iliac - started apixaban, change to Heparin if LP planned    DM2 - A1C 4.6 - allow liberal control  Hyperlipidemia - continue statin  Dysphagia - evaluated by speech-->dys 2 with thin liquids  Disposition Plan:   Transfer to [facility]  Family Communication:  No family at bedside this AM.   Consultants:  Oncology, Neurology   Code Status:  DNR  DVT Prophylaxis:  apixaban  Subjective: Patient is  encephalopathic. She denies any headache, chest pain, breath, abdominal pain, leg pain.   Objective: Vitals:   08/30/16 1810 08/30/16 2124 08/31/16 0529 08/31/16 1416  BP: (!) 120/53 119/62 (!) 103/58 116/63  Pulse: (!) 59 71 62 75  Resp:  18 18 18   Temp: 98 F (36.7 C) 97.6 F (36.4 C) 98.8 F (37.1 C) 98.6 F (37 C)  TempSrc: Oral Oral  Oral   SpO2: 100% 100% 92% 100%  Weight:      Height:        Intake/Output Summary (Last 24 hours) at 08/31/16 1710 Last data filed at 08/31/16 0458  Gross per 24 hour  Intake           811.25 ml  Output                0 ml  Net           811.25 ml   Weight change:  Exam:   General:  Pt is alert, confused but able to follow simple commands   HEENT: No icterus, No thrush, No neck mass, Benjamin Perez/AT  Cardiovascular: RRR, S1/S2, no rubs, no gallops  Respiratory: CTA bilaterally, no wheezing, no crackles, no rhonchi  Abdomen: Soft/+BS, non tender, non distended, no guarding  Extremities: 1 + LE edema, No lymphangitis, No petechiae, No rashes, no synovitis  Data Reviewed: I have personally reviewed following labs and imaging studies  Basic Metabolic Panel:  Recent Labs Lab 08/28/16 1659 08/29/16 0649 08/30/16 0356  NA 141 144 141  K 2.6* 3.9 3.7  CL 103 109 110  CO2 23 26 25   GLUCOSE 137* 109* 107*  BUN 36* 30* 24*  CREATININE 0.65 0.44 0.53  CALCIUM 8.8* 8.4* 8.2*  MG  --   --  2.0   Liver Function Tests:  Recent Labs Lab 08/28/16 1659  AST 23  ALT 11*  ALKPHOS 72  BILITOT 2.0*  PROT 7.1  ALBUMIN 3.5    Recent Labs Lab 08/28/16 1659  AMMONIA 26   Coagulation Profile:  Recent Labs Lab 08/29/16 1110  INR 1.25   CBC:  Recent Labs Lab 08/28/16 1659 08/29/16 0649 08/30/16 0356  WBC 5.0 4.6 4.8  HGB 12.4 11.3* 10.3*  HCT 36.8 34.4* 31.8*  MCV 88.9 88.9 90.9  PLT 113* 96* 104*   CBG:  Recent Labs Lab 08/29/16 1146 08/29/16 1648 08/29/16 2114 08/30/16 0748 08/30/16 1135  GLUCAP 98 123* 86 98 101*   HbA1C:  Recent Labs  08/29/16 1110  HGBA1C 4.8   Urine analysis:    Component Value Date/Time   COLORURINE AMBER (A) 08/28/2016 2352   APPEARANCEUR CLEAR 08/28/2016 2352   LABSPEC >1.046 (H) 08/28/2016 2352   PHURINE 6.0 08/28/2016 2352   GLUCOSEU NEGATIVE 08/28/2016 2352   GLUCOSEU NEGATIVE 10/19/2014 1246   HGBUR SMALL (A)  08/28/2016 2352   BILIRUBINUR NEGATIVE 08/28/2016 2352   KETONESUR 20 (A) 08/28/2016 2352   PROTEINUR 30 (A) 08/28/2016 2352   UROBILINOGEN 1.0 10/19/2014 1246   NITRITE NEGATIVE 08/28/2016 2352   LEUKOCYTESUR NEGATIVE 08/28/2016 2352   Recent Results (from the past 240 hour(s))  Urine culture     Status: None   Collection Time: 08/29/16 12:41 AM  Result Value Ref Range Status   Specimen Description URINE, RANDOM  Final   Special Requests NONE  Final   Culture   Final    NO GROWTH Performed at [facility], [address] [place], [place] [address]  Report Status 08/30/2016 FINAL  Final    Scheduled Meds: . amoxicillin-clavulanate  1 tablet Oral Q12H  . apixaban  10 mg Oral BID   Followed by  . [START ON 09/05/2016] apixaban  5 mg Oral BID  . atorvastatin  80 mg Oral Daily  . chlorhexidine  15 mL Mouth Rinse BID  . feeding supplement (ENSURE ENLIVE)  237 mL Oral TID BM  . levothyroxine  175 mcg Oral QAC breakfast  . mouth rinse  15 mL Mouth Rinse q12n4p  . potassium chloride  10 mEq Oral BID   Continuous Infusions: . 0.9 % NaCl with KCl 20 mEq / L 75 mL/hr at 08/31/16 0355   Procedures/Studies: Ct Head Wo Contrast  Result Date: 08/28/2016 CLINICAL DATA:  Altered mental status EXAM: CT HEAD WITHOUT CONTRAST TECHNIQUE: Contiguous axial images were obtained from the base of the skull through the vertex without intravenous contrast. COMPARISON:  10/04/2015 FINDINGS: Brain: No intracranial hemorrhage, mass effect or midline shift. Mild cerebral atrophy. Mild periventricular chronic white matter disease. No acute cortical infarction. No mass lesion is noted on this unenhanced scan. Vascular: Atherosclerotic calcifications of carotid siphon are noted. Skull: No skull fracture. Sinuses/Orbits: No acute findings Other: None IMPRESSION: No acute intracranial abnormality. Mild cerebral atrophy. Mild periventricular probable chronic white matter disease. No acute cortical  infarction. Electronically Signed   By: Lahoma Crocker M.D.   On: 08/28/2016 16:50   Ct Soft Tissue Neck W Contrast  Result Date: 08/28/2016 CLINICAL DATA:  Bleeding in mouth and throat. Severe halitosis. History of diabetes, metastatic breast cancer. EXAM: CT NECK WITH CONTRAST TECHNIQUE: Multidetector CT imaging of the neck was performed using the standard protocol following the bolus administration of intravenous contrast. CONTRAST:  70mL ISOVUE-300 IOPAMIDOL (ISOVUE-300) INJECTION 61% COMPARISON:  CT neck Dec 26, 2015 FINDINGS: Pharynx and larynx: Normal. Salivary glands: Atrophic and nonacute. Thyroid: Status post thyroidectomy. Lymph nodes: No lymphadenopathy by CT size criteria. Vascular: Mild calcific atherosclerosis of the carotid bifurcations. Limited intracranial: Normal. Visualized orbits: Normal. Mastoids and visualized paranasal sinuses: Small bilateral maxillary mucosal retention cysts without paranasal sinus air-fluid levels. Mastoid air cells are well aerated. Skeleton: Large bony fragment RIGHT mandible body superior aspect with erosive margins. Interval loss of multiple RIGHT maxillary teeth. Severe C5-6 and moderate C6-7 degenerative discs. Multilevel severe facet arthropathy. No destructive bony lesions. Dental malocclusion Upper chest: LEFT chest Port-A-Cath. Lung apices are clear. No superior mediastinal lymphadenopathy. Other: None. IMPRESSION: Interval loss of multiple RIGHT mandible teeth. RIGHT mandible probable osteomyelitis with large bony sequestrum, less likely postprocedural fracture nonunion. Electronically Signed   By: Elon Alas M.D.   On: 08/28/2016 19:03   Mr Brain Wo Contrast  Result Date: 08/29/2016 CLINICAL DATA:  History of metastatic breast cancer. Acute presentation with altered mental status. Gait disturbance. Lethargy. EXAM: MRI HEAD WITHOUT CONTRAST TECHNIQUE: Multiplanar, multiecho pulse sequences of the brain and surrounding structures were obtained without  intravenous contrast. COMPARISON:  Head CT 08/28/2016 FINDINGS: Brain: Diffusion imaging does not show any acute or subacute infarction or other cause of restricted diffusion. There is generalized brain atrophy with mild small vessel changes of the deep white matter. No cortical or large vessel territory infarction. No evidence of mass lesion, hemorrhage, hydrocephalus or extra-axial collection. No pituitary mass. Vascular: Major vessels at the base of the brain show flow. Skull and upper cervical spine: Negative Sinuses/Orbits: Clear/normal Other: None significant IMPRESSION: No evidence of metastatic disease or other acute process. Age related atrophy with mild  small vessel change of the deep white matter. Electronically Signed   By: Nelson Chimes M.D.   On: 08/29/2016 13:04   Mr Liver Wo Contrast  Result Date: 08/20/2016 CLINICAL DATA:  Breast cancer with bone and liver metastases. EXAM: MRI ABDOMEN WITHOUT CONTRAST TECHNIQUE: Multiplanar multisequence MR imaging was performed without the administration of intravenous contrast. COMPARISON:  06/04/2011 FINDINGS: Lower chest: No pleural fluid. Postoperative findings in right breast including diffuse edema and small fluid collection appear similar to previous exam. Hepatobiliary: Multifocal liver metastases are again identified. Index lesion within posterior right lobe of liver measures 3.5 x 5.0 cm, image 21 of series 4. On the previous exam this measured 4.9 x 6.0 cm. The index lesion within the lateral segment of left lobe of liver measures 5.0 x 3.3 cm, image 15 of series 4. This is compared with 5.0 x 3.3 cm previously. Index lesion within left lobe of liver measures 1.5 x 1.4 cm, image 18 of series 4. Previously 1.7 x 1.3 cm. Pancreas: No mass, inflammatory changes, or other parenchymal abnormality identified. Spleen:  Within normal limits in size and appearance. Adrenals/Urinary Tract: No masses identified. No evidence of hydronephrosis. Stomach/Bowel:  Visualized portions within the abdomen are unremarkable. Vascular/Lymphatic: No pathologically enlarged lymph nodes identified. No abdominal aortic aneurysm demonstrated. Other:  No free fluid or fluid collections identified Musculoskeletal: Mild lumbar spondylosis and degenerative disc disease. IMPRESSION: 1. Taking into account respiratory motion artifact on previous exam, the overall burden of hepatic metastasis is stable. 2. Lumbar spondylosis and degenerative disc disease. Electronically Signed   By: Kerby Moors M.D.   On: 08/20/2016 14:29   Faye Ramsay, MD  [facility] Pager [phone]  If 7PM-7AM, please contact night-coverage [email or web address] Password [email or web address] 08/31/2016, 5:10 PM   LOS: 2 days

## 2016-08-31 NOTE — Telephone Encounter (Signed)
This RN spoke with pt's dtr- [place] - per concerns with future needs and how to manage and plan for care.  [name] was inquiring above rehab post discharge and benefits with length of stay under current insurance benefits.  This RN discussed above as best as could including rehab cannot be evaluated at present due to unknown etiology in current presentation.  Celeste verbalized understanding of above with plan to contact Social Worker and Case Freight forwarder per [employer] for communication.

## 2016-09-01 ENCOUNTER — Encounter (HOSPITAL_COMMUNITY): Payer: Self-pay | Admitting: Radiology

## 2016-09-01 ENCOUNTER — Inpatient Hospital Stay (HOSPITAL_COMMUNITY): Payer: Medicare Other

## 2016-09-01 DIAGNOSIS — R4182 Altered mental status, unspecified: Secondary | ICD-10-CM

## 2016-09-01 LAB — FOLATE: Folate: 2.7 ng/mL — ABNORMAL LOW (ref >5.9)

## 2016-09-01 LAB — BASIC METABOLIC PANEL
Anion gap: 7 (ref 5–15)
BUN: 15 mg/dL (ref 6–20)
CHLORIDE: 115 mmol/L — AB (ref 101–111)
CO2: 19 mmol/L — ABNORMAL LOW (ref 22–32)
CREATININE: 0.53 mg/dL (ref 0.44–1.00)
Calcium: 7.9 mg/dL — ABNORMAL LOW (ref 8.9–10.3)
GFR calc Af Amer: >60 mL/min (ref >60)
GFR calc non Af Amer: >60 mL/min (ref >60)
GLUCOSE: 83 mg/dL (ref 65–99)
Potassium: 3.3 mmol/L — ABNORMAL LOW (ref 3.5–5.1)
SODIUM: 141 mmol/L (ref 135–145)

## 2016-09-01 LAB — CBC
HCT: 32 % — ABNORMAL LOW (ref 36.0–46.0)
HEMOGLOBIN: 10.6 g/dL — AB (ref 12.0–15.0)
MCH: 29.9 pg (ref 26.0–34.0)
MCHC: 33.1 g/dL (ref 30.0–36.0)
MCV: 90.4 fL (ref 78.0–100.0)
PLATELETS: 86 10*3/uL — AB (ref 150–400)
RBC: 3.54 MIL/uL — AB (ref 3.87–5.11)
RDW: 19.5 % — ABNORMAL HIGH (ref 11.5–15.5)
WBC: 3.6 10*3/uL — ABNORMAL LOW (ref 4.0–10.5)

## 2016-09-01 LAB — MAGNESIUM: MAGNESIUM: 1.8 mg/dL (ref 1.7–2.4)

## 2016-09-01 LAB — VITAMIN B12: Vitamin B-12: 1288 pg/mL — ABNORMAL HIGH (ref 180–914)

## 2016-09-01 MED ORDER — POTASSIUM CHLORIDE CRYS ER 20 MEQ PO TBCR
20.0000 meq | EXTENDED_RELEASE_TABLET | Freq: Once | ORAL | Status: AC
  Administered 2016-09-01: 20 meq via ORAL
  Filled 2016-09-01: qty 1

## 2016-09-01 MED ORDER — IOPAMIDOL (ISOVUE-300) INJECTION 61%
INTRAVENOUS | Status: AC
  Administered 2016-09-01: 50 mL
  Filled 2016-09-01: qty 50

## 2016-09-01 NOTE — NC FL2 (Signed)
[place] MEDICAID FL2 LEVEL OF CARE SCREENING TOOL     IDENTIFICATION  Patient Name: Christy Harrell Birthdate: 08-Aug-1941 Sex: female Admission Date (Current Location): 08/28/2016  [facility] and [place] Number:  Herbalist and Address:  [facility] [place]. [facility], [place] [address], [place], [place] [address]      Provider Number: [id number]  Attending Physician Name and Address:  Theodis Blaze, MD  Relative Name and Phone Number:       Current Level of Care: Hospital Recommended Level of Care: [place] Prior Approval Number:    Date Approved/Denied:   PASRR Number: [id number]  Discharge Plan: SNF    Current Diagnoses: Patient Active Problem List   Diagnosis Date Noted  . Leg DVT (deep venous thromboembolism), acute, left ([place]) 08/30/2016  . Acute deep vein thrombosis (DVT) of femoral vein of left lower extremity ([place])   . Acute encephalopathy 08/29/2016  . Dehydration 08/29/2016  . Altered mental status 08/28/2016  . Failure to thrive in adult 08/28/2016  . Thrombocytopenia ([place]) 08/28/2016  . 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 Cancer related pain 05/13/2016  . Anemia 02/19/2016  . Port catheter in place 01/21/2016  . Right facial pain 12/03/2015  . Eczema, dyshidrotic 06/19/2015  . 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 Allergy history, radiographic dye 11/21/2014  . Metastases to the liver ([place]) 07/17/2014  . Routine general medical examination at a health care facility 07/10/2014  . Situational anxiety 04/18/2014  . Cough 03/08/2014  . Severe obesity (BMI >= 40) ([place]) 01/15/2014  . Coronary artery calcification seen on CAT scan 12/25/2013  . Atherosclerosis of aorta ([place]) 12/02/2013  . Hypokalemia 11/28/2013  . Unspecified constipation 08/21/2013  . Malignant neoplasm of lower-outer quadrant of left breast of female, estrogen receptor positive ([place]) 05/09/2013   . Venous stasis dermatitis 03/22/2013  . Bone metastasis ([place]) 01/31/2013  . Unspecified vitamin D deficiency 12/23/2012  . Low back pain 12/01/2012  . Lymphedema of upper extremity following lymphadenectomy 02/15/2012  . Spinal stenosis in cervical region 10/28/2011  . Cervical spondylitis with radiculitis ([place]) 10/28/2011  . Hyperlipidemia with target LDL less than 100 09/03/2011  . Type 2 diabetes, uncontrolled, with neuropathy ([place]) 09/03/2011  . Osteoporosis 09/03/2011  . Hypothyroidism 09/03/2011  . Generalized OA 09/03/2011    Orientation RESPIRATION BLADDER Height & Weight     Self, Place  Normal Continent Weight: 114 lb 3.2 oz (51.8 kg) Height:  5' (152.4 cm)  BEHAVIORAL SYMPTOMS/MOOD NEUROLOGICAL BOWEL NUTRITION STATUS      Continent Diet (see DC summary)  AMBULATORY STATUS COMMUNICATION OF NEEDS Skin   Extensive Assist Verbally Normal                       Personal Care Assistance Level of Assistance  Bathing, Dressing Bathing Assistance: Maximum assistance   Dressing Assistance: Maximum assistance     Functional Limitations Info             SPECIAL CARE FACTORS FREQUENCY  PT (By licensed PT), OT (By licensed OT)     PT Frequency: 5/wk OT Frequency: 5/wk            Contractures      Additional Factors Info  Code Status, Allergies Code Status Info: DNR Allergies Info: Bydureon Exenatide, Gadolinium Derivatives, Enalapril           Current Medications (09/01/2016):  This is the  current hospital active medication list Current Facility-Administered Medications  Medication Dose Route Frequency Provider Last Rate Last Dose  . 0.9 % NaCl with KCl 20 mEq/ L  infusion   Intravenous Continuous Orson Eva, MD 75 mL/hr at 08/31/16 1719    . acetaminophen (TYLENOL) tablet 650 mg  650 mg Oral Q6H PRN Karmen Bongo, MD       Or  . acetaminophen (TYLENOL) suppository 650 mg  650 mg Rectal Q6H PRN Karmen Bongo, MD      . amoxicillin-clavulanate  (AUGMENTIN) 875-125 MG per tablet 1 tablet  1 tablet Oral Q12H Orson Eva, MD   1 tablet at 08/31/16 2328  . apixaban (ELIQUIS) tablet 10 mg  10 mg Oral BID Anh P Pham, RPH   10 mg at 08/31/16 2329   Followed by  . [START ON 09/05/2016] apixaban (ELIQUIS) tablet 5 mg  5 mg Oral BID Anh P Pham, RPH      . atorvastatin (LIPITOR) tablet 80 mg  80 mg Oral Daily Karmen Bongo, MD   80 mg at 08/31/16 1051  . chlorhexidine (PERIDEX) 0.12 % solution 15 mL  15 mL Mouth Rinse BID Karmen Bongo, MD   15 mL at 08/31/16 2328  . docusate sodium (COLACE) capsule 100 mg  100 mg Oral BID PRN Karmen Bongo, MD      . feeding supplement (ENSURE ENLIVE) (ENSURE ENLIVE) liquid 237 mL  237 mL Oral TID BM Orson Eva, MD   237 mL at 08/31/16 1053  . levothyroxine (SYNTHROID, LEVOTHROID) tablet 175 mcg  175 mcg Oral QAC breakfast Karmen Bongo, MD   175 mcg at 08/31/16 1052  . lip balm (CARMEX) ointment   Topical PRN Orson Eva, MD      . lubiprostone (AMITIZA) capsule 24 mcg  24 mcg Oral BID PRN Karmen Bongo, MD      . magic mouthwash  5 mL Oral QID PRN Karmen Bongo, MD      . MEDLINE mouth rinse  15 mL Mouth Rinse q12n4p Karmen Bongo, MD   15 mL at 08/31/16 1600  . ondansetron (ZOFRAN) tablet 4 mg  4 mg Oral Q6H PRN Karmen Bongo, MD       Or  . ondansetron [facility]) injection 4 mg  4 mg Intravenous Q6H PRN Karmen Bongo, MD   4 mg at 08/31/16 2345  . polyethylene glycol (MIRALAX / GLYCOLAX) packet 17 g  1 packet Oral Daily PRN Karmen Bongo, MD      . potassium chloride (K-DUR,KLOR-CON) CR tablet 10 mEq  10 mEq Oral BID Karmen Bongo, MD   10 mEq at 08/31/16 2328  . sodium chloride flush (NS) 0.9 % injection 10-40 mL  10-40 mL Intracatheter PRN Joseph Art, MD   10 mL at 08/31/16 0458   Facility-Administered Medications Ordered in Other Encounters  Medication Dose Route Frequency Provider Last Rate Last Dose  . sodium chloride flush (NS) 0.9 % injection 10 mL  10 mL Intracatheter PRN Chauncey Cruel, MD    10 mL at 07/23/16 1531     Discharge Medications: Please see discharge summary for a list of discharge medications.  Relevant Imaging Results:  Relevant Lab Results:   Additional Information SS#: [id number]  Jorge Ny, LCSW

## 2016-09-01 NOTE — Progress Notes (Signed)
NEURO HOSPITALIST CONSULT NOTE   Requestig physician: Dr. Doyle Askew   Reason for Consult: AMS   History obtained from:   Chart   HPI:                                                                                                                                          Christy Harrell is an 76 y.o. female with medical history significant of hypothyroidism, metastatic breat cancer undergoing chemotherapy, HTN, HLD, and DM presenting with AMS. daughter reports that she has been sounding lethargic, not laughing as usual, repeating herself, complaining about her mouth hurting. On January 2, the patient went to the oral surgeon, had teeth removed, had bone biopsy.  He gave her antibiotics (which she never took) and pain medications.  She went back and they told her there was no cancer present.  But she is still complaining about pain. Wednesday night, her daughter called starting about 6pm.  No answer.  4th call, the patient answered the phone and said she had been sleeping.  Her daughter reports that she has been sleeping all the time.  Yesterday, her cousin called her and they were unable to reach the patient.  Sent a neighbor to check on her.  They found her on the toilet, had been there for a couple of hours and was unable to get up and walk.  She required a lot of assistance walking.  They seated her in a chair in the den.  Neighbor was concerned she wasn't eating or drinking and fixed her some soup and a drink.    In ED K2.6, WBC 11.6 with negaative UA. --found to have a left leg DVT.   Neurology was asked to evaluate for possible meningeal carcinoma. MRI has been done but this was done without contrast.   Past Medical History:  Diagnosis Date  . Arthritis   . Cancer ([place])    right breast  . Club foot    chronic limp  . Diabetes mellitus    metformin  . GERD (gastroesophageal reflux disease)    watches diet  . History of radiation therapy 02/22/13- 03/13/13   left  proximal humerus 3500 cGy 14 sessions  . Hyperlipidemia   . Hypertension   . Lymphedema    right arm  . Metastasis from malignant tumor of breast ([place])    left humerous  . Metastasis from malignant tumor of breast ([place])    liver  . Neuromuscular disorder ([facility])    lt arm numb sometimes  . Osteoporosis   . Thyroid disease   . Wears glasses     Past Surgical History:  Procedure Laterality Date  . BREAST SURGERY  2012   rt lump-16 nodes-in [place]  . CESAREAN SECTION    . CESAREAN  SECTION    . COLONOSCOPY    . DILATION AND CURETTAGE OF UTERUS    . FOOT SURGERY     as a child for club foot  . HUMERUS IM NAIL Left 12/27/2012   Procedure: INTRAMEDULLARY (IM) NAIL HUMERAL LEFT PATHOLOGIC FRACTURE;  Surgeon: Nita Sells, MD;  Location: [place];  Service: Orthopedics;  Laterality: Left;  . THYROIDECTOMY  2002    Family History  Problem Relation Age of Onset  . Dementia Mother   . Hearing loss Mother   . Cancer Neg Hx   . Arthritis Neg Hx   . Heart disease Neg Hx   . Hyperlipidemia Neg Hx   . Hypertension Neg Hx       Social History:  reports that she quit smoking about 41 years ago. She has never used smokeless tobacco. She reports that she does not drink alcohol or use drugs.  Allergies  Allergen Reactions  . Bydureon [Exenatide] Rash  . Gadolinium Derivatives Other (See Comments)    Pt began having chest numbness/tingling , stated her heart felt funny, had to take her to ED for EKG per RN   CAP  . Enalapril Rash    MEDICATIONS:                                                                                                                     Prior to Admission:  Prescriptions Prior to Admission  Medication Sig Dispense Refill Last Dose  . aspirin EC 81 MG tablet Take 1 tablet (81 mg total) by mouth daily.   Past Week at Unknown time  . atorvastatin (LIPITOR) 80 MG tablet Take 1 tablet (80 mg total) by mouth daily. 30 tablet 11 Past Week at  Unknown time  . docusate sodium (COLACE) 100 MG capsule Take 1 capsule (100 mg total) by mouth 2 (two) times daily as needed. Reported on 10/17/2015 (Patient taking differently: Take 100 mg by mouth 2 (two) times daily as needed for mild constipation. Reported on 10/17/2015) 10 capsule 0 Past Week at Unknown time  . doxepin (SINEQUAN) 25 MG capsule Take 2 capsules (50 mg total) by mouth at bedtime as needed. (Patient taking differently: Take 50 mg by mouth at bedtime as needed (sleep). ) 180 capsule 1 Past Week at Unknown time  . furosemide (LASIX) 40 MG tablet Take 1 tablet (40 mg total) by mouth daily. 90 tablet 2 Past Week at Unknown time  . levothyroxine (SYNTHROID) 175 MCG tablet Take 1 tablet (175 mcg total) by mouth daily before breakfast. 90 tablet 1 Past Week at Unknown time  . lubiprostone (AMITIZA) 24 MCG capsule Take 1 capsule (24 mcg total) by mouth 2 (two) times daily with a meal. (Patient taking differently: Take 24 mcg by mouth 2 (two) times daily as needed for constipation. ) 180 capsule 1 Past Week at Unknown time  . metFORMIN (GLUCOPHAGE) 500 MG tablet Take 1 tablet (500 mg total) by mouth daily. 90 tablet 0 Past  Week at Unknown time  . oxyCODONE (OXY IR/ROXICODONE) 5 MG immediate release tablet Take 1-2 tablets (5-10 mg total) by mouth every 6 (six) hours as needed for severe pain. 100 tablet 0 Past Week at Unknown time  . polyethylene glycol powder (GLYCOLAX/MIRALAX) powder Take 255 g (1 Container total) by mouth daily. (Patient taking differently: Take 1 Container by mouth daily as needed for mild constipation or moderate constipation. ) 255 g 3 Past Week at Unknown time  . potassium chloride (MICRO-K) 10 MEQ CR capsule Take 1 capsule (10 mEq total) by mouth 2 (two) times daily. 60 capsule 1 Past Week at Unknown time  . pregabalin (LYRICA) 75 MG capsule Take 1 capsule (75 mg total) by mouth 2 (two) times daily. Not taking 60 capsule 5 Past Week at Unknown time  . magic mouthwash SOLN  Take 5 mLs by mouth 4 (four) times daily as needed for mouth pain. (Patient not taking: Reported on 08/28/2016) 240 mL 3 Completed Course at Unknown time   Scheduled: . amoxicillin-clavulanate  1 tablet Oral Q12H  . apixaban  10 mg Oral BID   Followed by  . [START ON 09/05/2016] apixaban  5 mg Oral BID  . atorvastatin  80 mg Oral Daily  . chlorhexidine  15 mL Mouth Rinse BID  . feeding supplement (ENSURE ENLIVE)  237 mL Oral TID BM  . levothyroxine  175 mcg Oral QAC breakfast  . mouth rinse  15 mL Mouth Rinse q12n4p  . potassium chloride  10 mEq Oral BID     ROS:                                                                                                                                       History obtained from unobtainable from patient due to mental status    Blood pressure (!) 72/45, pulse 78, temperature 98.4 F (36.9 C), resp. rate 18, height 5' (1.524 m), weight 51.8 kg (114 lb 3.2 oz), SpO2 100 %.   Neurologic Examination:                                                                                                      HEENT-  Normocephalic, no lesions, without obvious abnormality.  Normal external eye and conjunctiva.  Normal TM's bilaterally.  Normal auditory canals and external ears. Normal external nose, mucus membranes and septum.  Normal pharynx. Cardiovascular- S1, S2 normal, pulses palpable throughout   Lungs- chest clear, no wheezing, rales, normal symmetric  air entry Abdomen- normal findings: bowel sounds normal Extremities- right arm with increased lymphademea Lymph-no adenopathy palpable Musculoskeletal-no joint tenderness, deformity or swelling Skin-warm and dry, no hyperpigmentation, vitiligo, or suspicious lesions  Neurological Examination Mental Status: Very drowsy, able to follow simple commands, falls to sleep easily. Unable to answer my questions due to drowsiness.  Cranial Nerves: II:  Visual fields grossly normal, pupils equal, round, reactive  to light and accommodation III,IV, VI: ptosis not present, extra-ocular motions intact bilaterally V,VII: smile symmetric, facial light touch sensation normal bilaterally VIII: hearing normal bilaterally IX,X: uvula rises symmetrically XI: bilateral shoulder shrug XII: midline tongue extension Motor: Moving all extremities antigravity Sensory: Pinprick and light touch intact throughout, bilaterally Deep Tendon Reflexes: 1+ and symmetric throughout Plantars: Right: downgoing   Left: downgoing Cerebellar: Could not assess due to her drowsiness.  Gait:not tested.       Lab Results: Basic Metabolic Panel:  Recent Labs Lab 08/28/16 1659 08/29/16 0649 08/30/16 0356 09/01/16 0356 09/01/16 1435  NA 141 144 141 141  --   K 2.6* 3.9 3.7 3.3*  --   CL 103 109 110 115*  --   CO2 23 26 25  19*  --   GLUCOSE 137* 109* 107* 83  --   BUN 36* 30* 24* 15  --   CREATININE 0.65 0.44 0.53 0.53  --   CALCIUM 8.8* 8.4* 8.2* 7.9*  --   MG  --   --  2.0  --  1.8    Liver Function Tests:  Recent Labs Lab 08/28/16 1659  AST 23  ALT 11*  ALKPHOS 72  BILITOT 2.0*  PROT 7.1  ALBUMIN 3.5   No results for input(s): LIPASE, AMYLASE in the last 168 hours.  Recent Labs Lab 08/28/16 1659  AMMONIA 26    CBC:  Recent Labs Lab 08/28/16 1659 08/29/16 0649 08/30/16 0356 09/01/16 0356  WBC 5.0 4.6 4.8 3.6*  HGB 12.4 11.3* 10.3* 10.6*  HCT 36.8 34.4* 31.8* 32.0*  MCV 88.9 88.9 90.9 90.4  PLT 113* 96* 104* 86*    Cardiac Enzymes: No results for input(s): CKTOTAL, CKMB, CKMBINDEX, TROPONINI in the last 168 hours.  Lipid Panel: No results for input(s): CHOL, TRIG, HDL, CHOLHDL, VLDL, LDLCALC in the last 168 hours.  CBG:  Recent Labs Lab 08/29/16 1146 08/29/16 1648 08/29/16 2114 08/30/16 0748 08/30/16 1135  GLUCAP 98 123* 84 98 101*    Microbiology: Results for orders placed or performed during the hospital encounter of 08/28/16  Urine culture     Status: None    Collection Time: 08/29/16 12:41 AM  Result Value Ref Range Status   Specimen Description URINE, RANDOM  Final   Special Requests NONE  Final   Culture   Final    NO GROWTH Performed at [facility], [place] [address], [place], [place] [address]    Report Status 08/30/2016 FINAL  Final   *Note: Due to a large number of results and/or encounters for the requested time period, some results have not been displayed. A complete set of results can be found in Results Review.    Coagulation Studies: No results for input(s): LABPROT, INR in the last 72 hours.  Imaging: Ct Head W & Wo Contrast  Result Date: 09/01/2016 CLINICAL DATA:  Altered mental status. History of metastatic breast cancer. EXAM: CT HEAD WITHOUT AND WITH CONTRAST TECHNIQUE: Contiguous axial images were obtained from the base of the skull through the vertex without and with intravenous contrast  CONTRAST:  58mL ISOVUE-300 IOPAMIDOL (ISOVUE-300) INJECTION 61% COMPARISON:  08/29/2016 brain MRI FINDINGS: Brain: No evidence of acute infarction, hemorrhage, hydrocephalus, extra-axial collection or mass lesion/mass effect. Mild for age generalized volume loss and white matter disease from chronic microvascular ischemia. Vascular: No hyperdense vessel or unexpected calcification. Visible vessels are patent. Broad basilar tip which contacts the third ventricular floor; on thinner section imaging in an earlier contrast phase on maxillofacial CT 08/28/2016 there is no aneurysm. Skull: No acute or aggressive finding. Sinuses/Orbits: Negative IMPRESSION: No acute finding.  No evidence of metastatic disease. Electronically Signed   By: Monte Fantasia M.D.   On: 09/01/2016 11:35    MRI on 08/29/2016  IMPRESSION: No evidence of metastatic disease or other acute process. Age related atrophy with mild small vessel change of the deep white matter.  EEG--no epileptiform activity Assessment and plan per attending neurologist  [name]  PA-C [facility] [phone]  09/01/2016, 3:58 PM   Assessment/Plan: 76 YO female with AMS, lethargy, FTT, depression now extremely lethargic. Etiology is unclear at this time.  CT head with contrast showed no leptomeningeal enhancementy, EEG to evaluate for subclinical seizures showed no epileptiform activity. . Electrolytes and ammonia are within normal limits.

## 2016-09-01 NOTE — Progress Notes (Signed)
SLP Cancellation Note  Patient Details Name: Christy Harrell MRN: [id number] DOB: [birth year]   Cancelled treatment:        Pt was out of the room in testing earlier. Will continue to attempt.   [name] 09/01/2016, 3:02 PM   [name] Caroli.Ed [employer] [phone]

## 2016-09-01 NOTE — Care Management Note (Addendum)
Case Management Note  Patient Details  Name: Cing Kunda MRN: [id number] Date of Birth: Sep 01, 1940  Subjective/Objective:                 Patient transferred from [facility] for neuro workup, continued encephalopathy, plan for EEG and CT head per MD note.   Action/Plan:  Anticipate DC to SNF when medically clear.  Expected Discharge Date:   (unknown)               Expected Discharge Plan:  Skilled Nursing Facility  In-House Referral:  Clinical Social Work  Discharge planning Services  CM Consult  Post Acute Care Choice:    Choice offered to:     DME Arranged:    DME Agency:     HH Arranged:    [place] Agency:     Status of Service:  In process, will continue to follow  If discussed at Long Length of Stay Meetings, dates discussed:    Additional Comments:  Carles Collet, RN 09/01/2016, 10:59 AM

## 2016-09-01 NOTE — Progress Notes (Signed)
EEG Completed; Results Pending  

## 2016-09-01 NOTE — Progress Notes (Addendum)
PROGRESS NOTE  Christy Harrell [id number] DOB: 05-19-1941 DOA: 08/28/2016   PCP: [name], MD   Brief History:  76 year old female with a history of metastatic breast cancer, hypertension, diabetes mellitus, hyperlipidemia presenting with altered mental status. The patient is poor historian.  Much of the history is provided through the patient's daughter who lives in [place]. Apparently, the patient's daughter noted that the patient was sleepy and repeating herself on the phone on the evening of 08/26/2016. On the morning of 08/27/2016, the patient's cousin was unable to reach the patient, and so the neighbor to check up on the patient. The patient was found sitting on the commode and unable to get up and walk. EMS was activated, and the patient was transferred to sit in a chair and that done. She was noted to be hypotensive at that time and given 500 mL bolus of fluid. The patient refused transfer to the hospital. On the morning of 08/28/2016, the patient's cousin went to check up on the patient again, and the patient was noted not to have moved from the chair in the den from the previous day. Apparently, the patient was confused and noted to be incontinent of urine and stool. EMS was activated, and the patient was brought to the emergency room for further evaluation. The patient's daughter states that the patient has had a gradual functional decline since August 2017. In emergency department, the patient was noted to be hypokalemic. Further workup revealed WBC 11.6. Urinalysis was negative for pyuria. Ammonia was 26.  Pt was transferred from [facility] to [facility] on 1/21 for further neurological work up. EEG was done and showed no evidence of epileptiform activity.   Assessment/Plan: Acute metabolic encephalopathy - Dehydration and volume depletion contributing to her encephalopathy, ? Paraneoplastic syndrome  - TSH 2.106 - Ammonia 26 - serum B12--1645 - RPR--neg - MRI brain and  CT head both neg for metastasis - Urinalysis negative for pyuria - neurology team consulted, EEG done but not revealing etiology of the encephalopathy  - ? Need for LP, appreciate assistance   Failure to thrive, severe PCM  - consulted nutrition, appreciate assistance  - provide ensure and encouraged oral intake   Dehydration - The patient presented with hemoconcentration and elevated BUN above her normal baseline - BUN trending down towards target range  - BP is however low, will add NS to medical regimen today to see if this will help   Metastatic breast cancer - Initially diagnosed in March 2012 - Metastasis noted initially May 2014--metastasis to bone and liver - Has had history of pathologic fracture to the humerus - has been receiving eribulin, last does 07/23/2017 - appreciate Dr. [name] following  - PCT consulted for further discussions of [place]  Osteonecrosis of the jaw - per Dr. Karmen Bongo, Bone was biopsied and sent to [facility] with result read as osteonecrosis of the jaw, no cancer or evidence of infection noted - 08/28/2016 CT neck right mandible with large bony sequestrum - started Amox/Clav that was ordered by her oral surgeon but pt did not fill Rx, today is day #5/7  Hypokalemia - supplement as it is low this AM  - BMP In AM  Thrombocytopenia, pancytopenia  - possibly due to acute DVT and ? Eliquis  - currently no signs of bleeding  - Serum B12--1645 - TSH 2.106 - may need to hold Eliquis if further drop in Plt and Hg noted  Acute DVT Left Lower Extremity - venous duplex--+DVT left common iliac and left external iliac - started apixaban, change to Heparin if LP planned   - I am worried that Plt's are trending down and if this persists, will have consider holding AC   DM2 - A1C 4.6 - allow liberal control - no need for SSI  Hyperlipidemia - continue statin  Dysphagia - evaluated by speech-->dys 2 with thin liquids  Disposition Plan:    To be determined once mental status improves   Family Communication:  No family at bedside this AM.   Consultants:  Oncology, Neurology, PCT  Code Status:  DNR  DVT Prophylaxis:  apixaban  Subjective: Patient is encephalopathic. She denies any headache, chest pain, breath, abdominal pain, leg pain.   Objective: Vitals:   08/31/16 1416 08/31/16 2229 09/01/16 0420 09/01/16 1422  BP: 116/63 (!) 126/49 (!) 124/54 (!) 72/45  Pulse: 75 73 69 78  Resp: 18 18 18 18   Temp: 98.6 F (37 C) 98 F (36.7 C) 98 F (36.7 C) 98.4 F (36.9 C)  TempSrc:   Oral   SpO2: 100% 100% 100% 100%  Weight:      Height:        Intake/Output Summary (Last 24 hours) at 09/01/16 1654 Last data filed at 09/01/16 1435  Gross per 24 hour  Intake          1501.25 ml  Output                0 ml  Net          1501.25 ml   Weight change:  Exam:   General:  Pt is alert, confused but able to follow simple commands   HEENT: No icterus, No thrush, No neck mass, [place]/AT  Cardiovascular: RRR, S1/S2, no rubs, no gallops  Respiratory: CTA bilaterally, no wheezing, no crackles, no rhonchi  Abdomen: Soft/+BS, non tender, non distended, no guarding  Extremities: 1 + LE edema, No lymphangitis, No petechiae, No rashes, no synovitis  Data Reviewed: I have personally reviewed following labs and imaging studies  Basic Metabolic Panel:  Recent Labs Lab 08/28/16 1659 08/29/16 0649 08/30/16 0356 09/01/16 0356 09/01/16 1435  NA 141 144 141 141  --   K 2.6* 3.9 3.7 3.3*  --   CL 103 109 110 115*  --   CO2 23 26 25  19*  --   GLUCOSE 137* 109* 107* 83  --   BUN 36* 30* 24* 15  --   CREATININE 0.65 0.44 0.53 0.53  --   CALCIUM 8.8* 8.4* 8.2* 7.9*  --   MG  --   --  2.0  --  1.8   Liver Function Tests:  Recent Labs Lab 08/28/16 1659  AST 23  ALT 11*  ALKPHOS 72  BILITOT 2.0*  PROT 7.1  ALBUMIN 3.5    Recent Labs Lab 08/28/16 1659  AMMONIA 26   Coagulation Profile:  Recent Labs Lab  08/29/16 1110  INR 1.25   CBC:  Recent Labs Lab 08/28/16 1659 08/29/16 0649 08/30/16 0356 09/01/16 0356  WBC 5.0 4.6 4.8 3.6*  HGB 12.4 11.3* 10.3* 10.6*  HCT 36.8 34.4* 31.8* 32.0*  MCV 88.9 88.9 90.9 90.4  PLT 113* 96* 104* 86*   CBG:  Recent Labs Lab 08/29/16 1146 08/29/16 1648 08/29/16 2114 08/30/16 0748 08/30/16 1135  GLUCAP 98 123* 86 98 101*   Urine analysis:    Component Value Date/Time   COLORURINE AMBER (A)  08/28/2016 2352   APPEARANCEUR CLEAR 08/28/2016 2352   LABSPEC >1.046 (H) 08/28/2016 2352   PHURINE 6.0 08/28/2016 2352   GLUCOSEU NEGATIVE 08/28/2016 2352   GLUCOSEU NEGATIVE 10/19/2014 1246   HGBUR SMALL (A) 08/28/2016 2352   BILIRUBINUR NEGATIVE 08/28/2016 2352   KETONESUR 20 (A) 08/28/2016 2352   PROTEINUR 30 (A) 08/28/2016 2352   UROBILINOGEN 1.0 10/19/2014 1246   NITRITE NEGATIVE 08/28/2016 2352   LEUKOCYTESUR NEGATIVE 08/28/2016 2352   Recent Results (from the past 240 hour(s))  Urine culture     Status: None   Collection Time: 08/29/16 12:41 AM  Result Value Ref Range Status   Specimen Description URINE, RANDOM  Final   Special Requests NONE  Final   Culture   Final    NO GROWTH Performed at [facility], [place] [address] [place], [place] [address]    Report Status 08/30/2016 FINAL  Final    Scheduled Meds: . amoxicillin-clavulanate  1 tablet Oral Q12H  . apixaban  10 mg Oral BID   Followed by  . [START ON 09/05/2016] apixaban  5 mg Oral BID  . chlorhexidine  15 mL Mouth Rinse BID  . feeding supplement (ENSURE ENLIVE)  237 mL Oral TID BM  . levothyroxine  175 mcg Oral QAC breakfast  . mouth rinse  15 mL Mouth Rinse q12n4p  . potassium chloride  10 mEq Oral BID   Continuous Infusions: . 0.9 % NaCl with KCl 20 mEq / L 75 mL/hr at 08/31/16 1719   Procedures/Studies: Ct Head Wo Contrast  Result Date: 08/28/2016 CLINICAL DATA:  Altered mental status EXAM: CT HEAD WITHOUT CONTRAST TECHNIQUE: Contiguous axial images were  obtained from the base of the skull through the vertex without intravenous contrast. COMPARISON:  10/04/2015 FINDINGS: Brain: No intracranial hemorrhage, mass effect or midline shift. Mild cerebral atrophy. Mild periventricular chronic white matter disease. No acute cortical infarction. No mass lesion is noted on this unenhanced scan. Vascular: Atherosclerotic calcifications of carotid siphon are noted. Skull: No skull fracture. Sinuses/Orbits: No acute findings Other: None IMPRESSION: No acute intracranial abnormality. Mild cerebral atrophy. Mild periventricular probable chronic white matter disease. No acute cortical infarction. Electronically Signed   By: Lahoma Crocker M.D.   On: 08/28/2016 16:50   Ct Head W & Wo Contrast  Result Date: 09/01/2016 CLINICAL DATA:  Altered mental status. History of metastatic breast cancer. EXAM: CT HEAD WITHOUT AND WITH CONTRAST TECHNIQUE: Contiguous axial images were obtained from the base of the skull through the vertex without and with intravenous contrast CONTRAST:  39mL ISOVUE-300 IOPAMIDOL (ISOVUE-300) INJECTION 61% COMPARISON:  08/29/2016 brain MRI FINDINGS: Brain: No evidence of acute infarction, hemorrhage, hydrocephalus, extra-axial collection or mass lesion/mass effect. Mild for age generalized volume loss and white matter disease from chronic microvascular ischemia. Vascular: No hyperdense vessel or unexpected calcification. Visible vessels are patent. Broad basilar tip which contacts the third ventricular floor; on thinner section imaging in an earlier contrast phase on maxillofacial CT 08/28/2016 there is no aneurysm. Skull: No acute or aggressive finding. Sinuses/Orbits: Negative IMPRESSION: No acute finding.  No evidence of metastatic disease. Electronically Signed   By: Monte Fantasia M.D.   On: 09/01/2016 11:35   Ct Soft Tissue Neck W Contrast  Result Date: 08/28/2016 CLINICAL DATA:  Bleeding in mouth and throat. Severe halitosis. History of diabetes,  metastatic breast cancer. EXAM: CT NECK WITH CONTRAST TECHNIQUE: Multidetector CT imaging of the neck was performed using the standard protocol following the bolus administration of  intravenous contrast. CONTRAST:  50mL ISOVUE-300 IOPAMIDOL (ISOVUE-300) INJECTION 61% COMPARISON:  CT neck Dec 26, 2015 FINDINGS: Pharynx and larynx: Normal. Salivary glands: Atrophic and nonacute. Thyroid: Status post thyroidectomy. Lymph nodes: No lymphadenopathy by CT size criteria. Vascular: Mild calcific atherosclerosis of the carotid bifurcations. Limited intracranial: Normal. Visualized orbits: Normal. Mastoids and visualized paranasal sinuses: Small bilateral maxillary mucosal retention cysts without paranasal sinus air-fluid levels. Mastoid air cells are well aerated. Skeleton: Large bony fragment RIGHT mandible body superior aspect with erosive margins. Interval loss of multiple RIGHT maxillary teeth. Severe C5-6 and moderate C6-7 degenerative discs. Multilevel severe facet arthropathy. No destructive bony lesions. Dental malocclusion Upper chest: LEFT chest Port-A-Cath. Lung apices are clear. No superior mediastinal lymphadenopathy. Other: None. IMPRESSION: Interval loss of multiple RIGHT mandible teeth. RIGHT mandible probable osteomyelitis with large bony sequestrum, less likely postprocedural fracture nonunion. Electronically Signed   By: Elon Alas M.D.   On: 08/28/2016 19:03   Mr Brain Wo Contrast  Result Date: 08/29/2016 CLINICAL DATA:  History of metastatic breast cancer. Acute presentation with altered mental status. Gait disturbance. Lethargy. EXAM: MRI HEAD WITHOUT CONTRAST TECHNIQUE: Multiplanar, multiecho pulse sequences of the brain and surrounding structures were obtained without intravenous contrast. COMPARISON:  Head CT 08/28/2016 FINDINGS: Brain: Diffusion imaging does not show any acute or subacute infarction or other cause of restricted diffusion. There is generalized brain atrophy with mild small  vessel changes of the deep white matter. No cortical or large vessel territory infarction. No evidence of mass lesion, hemorrhage, hydrocephalus or extra-axial collection. No pituitary mass. Vascular: Major vessels at the base of the brain show flow. Skull and upper cervical spine: Negative Sinuses/Orbits: Clear/normal Other: None significant IMPRESSION: No evidence of metastatic disease or other acute process. Age related atrophy with mild small vessel change of the deep white matter. Electronically Signed   By: Nelson Chimes M.D.   On: 08/29/2016 13:04   Mr Liver Wo Contrast  Result Date: 08/20/2016 CLINICAL DATA:  Breast cancer with bone and liver metastases. EXAM: MRI ABDOMEN WITHOUT CONTRAST TECHNIQUE: Multiplanar multisequence MR imaging was performed without the administration of intravenous contrast. COMPARISON:  06/04/2011 FINDINGS: Lower chest: No pleural fluid. Postoperative findings in right breast including diffuse edema and small fluid collection appear similar to previous exam. Hepatobiliary: Multifocal liver metastases are again identified. Index lesion within posterior right lobe of liver measures 3.5 x 5.0 cm, image 21 of series 4. On the previous exam this measured 4.9 x 6.0 cm. The index lesion within the lateral segment of left lobe of liver measures 5.0 x 3.3 cm, image 15 of series 4. This is compared with 5.0 x 3.3 cm previously. Index lesion within left lobe of liver measures 1.5 x 1.4 cm, image 18 of series 4. Previously 1.7 x 1.3 cm. Pancreas: No mass, inflammatory changes, or other parenchymal abnormality identified. Spleen:  Within normal limits in size and appearance. Adrenals/Urinary Tract: No masses identified. No evidence of hydronephrosis. Stomach/Bowel: Visualized portions within the abdomen are unremarkable. Vascular/Lymphatic: No pathologically enlarged lymph nodes identified. No abdominal aortic aneurysm demonstrated. Other:  No free fluid or fluid collections identified  Musculoskeletal: Mild lumbar spondylosis and degenerative disc disease. IMPRESSION: 1. Taking into account respiratory motion artifact on previous exam, the overall burden of hepatic metastasis is stable. 2. Lumbar spondylosis and degenerative disc disease. Electronically Signed   By: Kerby Moors M.D.   On: 08/20/2016 14:29   Faye Ramsay, MD  [facility] Pager [phone]  If 7PM-7AM, please contact  night-coverage [email or web address] Password [email or web address] 09/01/2016, 4:54 PM   LOS: 3 days

## 2016-09-01 NOTE — Procedures (Signed)
ELECTROENCEPHALOGRAM REPORT  Date of Study: 09/01/2016  Patient's Name: Christy Harrell MRN: [id number] Date of Birth: [birth year]  Referring Provider: [name], PA-C  Clinical History: This is a 76 year old woman with altered mental status.  Medications: acetaminophen (TYLENOL) tablet 650 mg  amoxicillin-clavulanate (AUGMENTIN) 875-125 MG per tablet 1 tablet  apixaban (ELIQUIS) tablet 5 mg  atorvastatin (LIPITOR) tablet 80 mg  levothyroxine (SYNTHROID, LEVOTHROID) tablet 175 mcg  lubiprostone (AMITIZA) capsule 24 mcg  ondansetron (ZOFRAN) tablet 4 mg   Technical Summary: A multichannel digital EEG recording measured by the international 10-20 system with electrodes applied with paste and impedances below 5000 ohms performed as portable with EKG monitoring in an awake and drowsy patient.  Hyperventilation and photic stimulation were not performed.  The digital EEG was referentially recorded, reformatted, and digitally filtered in a variety of bipolar and referential montages for optimal display.   Description: The patient is awake and drowsy during the recording.  During maximal wakefulness, there is a symmetric, medium voltage 8 Hz posterior dominant rhythm that poorly attenuates with eye opening and eye closure. This is admixed with a small amount of diffuse 4-5 Hz theta and 2-3 Hz delta slowing of the waking background.  Normal sleep architecture was not seen. Hyperventilation and photic stimulation were not performed  There were no epileptiform discharges or electrographic seizures seen.    EKG lead was unremarkable.  Impression: This awake and drowsy EEG is abnormal due to mild to moderate diffuse slowing of the waking background.  Clinical Correlation of the above findings indicates diffuse cerebral dysfunction that is non-specific in etiology and can be seen with hypoxic/ischemic injury, toxic/metabolic encephalopathies, neurodegenerative disorders, or medication effect.  The  absence of epileptiform discharges does not rule out a clinical diagnosis of epilepsy.  Clinical correlation is advised.   Ellouise Newer, M.D.

## 2016-09-01 NOTE — Clinical Social Work Note (Signed)
Clinical Social Work Assessment  Patient Details  Name: Christy Harrell MRN: [id number] Date of Birth: 1941/03/12  Date of referral:  09/01/16               Reason for consult:  Facility Placement                Permission sought to share information with:  Chartered certified accountant granted to share information::  Yes, Verbal Permission Granted  Name::     Radio producer::  SNF  Relationship::     Contact Information:     Housing/Transportation Living arrangements for the past 2 months:  Single Family Home Source of Information:  Adult Children Patient Interpreter Needed:  None Criminal Activity/Legal Involvement Pertinent to Current Situation/Hospitalization:  No - Comment as needed Significant Relationships:  Adult Children Lives with:  Self Do you feel safe going back to the place where you live?  No Need for family participation in patient care:  Yes (Comment) (decision making)  Care giving concerns:  Pt lives at home alone and dtr [place] out of state.  Pt normally able to take care of herself at home but dtr states pt has significant change in impairment/mentation during this admission.   Social Worker assessment / plan:  CSW spoke with pt dtr about plan for pt at time of DC.  Explained PT recommendation for SNF and referral process for SNF.  Dtr expressed understanding but also wondering if pt could be supported at home for a few days while she works- CSW explained that home health would not provide 24 hour assistance and currently pt needing 2 person assist.    Dtr states her eventual plan will be to take patient home with her- will do this after hospital stay if able or after rehab stay.  Employment status:  Retired Forensic scientist:  Medicare PT Recommendations:  [place] / Referral to community resources:  [place]  Patient/Family's Response to care:  Dtr agreeable to [employer] and understands pt  might need short term rehab to recover from this episode.  Patient/Family's Understanding of and Emotional Response to Diagnosis, Current Treatment, and Prognosis:  No questions or concerns- hopeful pt will bounce back quickly.  Emotional Assessment Appearance:  Appears stated age Attitude/Demeanor/Rapport:  Unable to Assess Affect (typically observed):  Unable to Assess Orientation:  Oriented to Self, Oriented to Place Alcohol / Substance use:  Not Applicable Psych involvement (Current and /or in the community):  No (Comment)  Discharge Needs  Concerns to be addressed:  Care Coordination Readmission within the last 30 days:  No Current discharge risk:  Physical Impairment Barriers to Discharge:  Continued Medical Work up   Jorge Ny, LCSW 09/01/2016, 1:55 PM

## 2016-09-02 ENCOUNTER — Ambulatory Visit: Payer: Medicare Other | Admitting: Oncology

## 2016-09-02 ENCOUNTER — Other Ambulatory Visit: Payer: Medicare Other

## 2016-09-02 DIAGNOSIS — G9340 Encephalopathy, unspecified: Secondary | ICD-10-CM

## 2016-09-02 LAB — BASIC METABOLIC PANEL
Anion gap: 8 (ref 5–15)
BUN: 12 mg/dL (ref 6–20)
CHLORIDE: 113 mmol/L — AB (ref 101–111)
CO2: 20 mmol/L — AB (ref 22–32)
CREATININE: 0.46 mg/dL (ref 0.44–1.00)
Calcium: 7.9 mg/dL — ABNORMAL LOW (ref 8.9–10.3)
GFR calc Af Amer: >60 mL/min (ref >60)
GFR calc non Af Amer: >60 mL/min (ref >60)
Glucose, Bld: 103 mg/dL — ABNORMAL HIGH (ref 65–99)
POTASSIUM: 3.7 mmol/L (ref 3.5–5.1)
Sodium: 141 mmol/L (ref 135–145)

## 2016-09-02 LAB — CBC
HEMATOCRIT: 34.6 % — AB (ref 36.0–46.0)
HEMOGLOBIN: 11.3 g/dL — AB (ref 12.0–15.0)
MCH: 29.5 pg (ref 26.0–34.0)
MCHC: 32.7 g/dL (ref 30.0–36.0)
MCV: 90.3 fL (ref 78.0–100.0)
Platelets: 98 10*3/uL — ABNORMAL LOW (ref 150–400)
RBC: 3.83 MIL/uL — AB (ref 3.87–5.11)
RDW: 19.2 % — ABNORMAL HIGH (ref 11.5–15.5)
WBC: 3.7 10*3/uL — ABNORMAL LOW (ref 4.0–10.5)

## 2016-09-02 LAB — RPR: RPR Ser Ql: NONREACTIVE

## 2016-09-02 NOTE — Progress Notes (Signed)
Nutrition Follow-up  DOCUMENTATION CODES:   Severe malnutrition in context of chronic illness  INTERVENTION:   -Continue Ensure Enlive po TID, each supplement provides 350 kcal and 20 grams of protein -Magic Cup TID with meals  NUTRITION DIAGNOSIS:   Malnutrition related to chronic illness as evidenced by percent weight loss, severe depletion of body fat, severe depletion of muscle mass.  Ongoing  GOAL:   Patient will meet greater than or equal to 90% of their needs  Unmet  MONITOR:   PO intake, Supplement acceptance, Labs, Weight trends, I & O's  REASON FOR ASSESSMENT:   Consult Assessment of nutrition requirement/status  ASSESSMENT:   76 year old female with a history of metastatic breast cancer, hypertension, diabetes mellitus, hyperlipidemia presenting with altered mental status. The patient is poor historian.  Much of the history is provided through the patient's daughter who lives in [place]. Apparently, the patient's daughter noted that the patient was sleepy and repeating herself on the phone on the evening of 08/26/2016. On the morning of 08/27/2016, the patient's cousin was unable to reach the patient, and so the neighbor to check up on the patient. The patient was found sitting on the commode and unable to get up and walk. EMS was activated, and the patient was transferred to sit in a chair and that done. She was noted to be hypotensive at that time and given 500 mL bolus of fluid. The patient refused transfer to the hospital. On the morning of 08/28/2016, the patient's cousin went to check up on the patient again, and the patient was noted not to have moved from the chair in the den from the previous day. Apparently, the patient was confused and noted to be incontinent of urine and stool.  Pt in with MD at time of visit.   Pt transferred from [facility] to [facility] for neurological evaluation on 08/30/16.   Pt remains on a dysphagia 2 diet with thin liquids. Meal  completion continues to be poor; PO: 0-10%. Pt accepts Ensure supplements approximately 50% of time that they are offered.   Neurology evaluation recommending lumbar puncture as an outpatient.   Per oncology notes, recommending hospice referral.   Labs reviewed.  Diet Order:  DIET DYS 2 Room service appropriate? Yes; Fluid consistency: Thin  Skin:  Reviewed, no issues  Last BM:  09/01/16  Height:   Ht Readings from Last 1 Encounters:  08/29/16 5' (1.524 m)    Weight:   Wt Readings from Last 1 Encounters:  08/29/16 114 lb 3.2 oz (51.8 kg)    Ideal Body Weight:  45.5 kg  BMI:  Body mass index is 22.3 kg/m.  Estimated Nutritional Needs:   Kcal:  1400-1600  Protein:  65-75g  Fluid:  1.6L/day  EDUCATION NEEDS:   No education needs identified at this time  Clemmie Buelna A. Jimmye Norman, RD, LDN, CDE Pager: [phone] After hours Pager: [phone]

## 2016-09-02 NOTE — Progress Notes (Signed)
PROGRESS NOTE  Christy Harrell [id number] DOB: 19-Feb-1941 DOA: 08/28/2016   PCP: [name], MD   Brief History:  76 year old female with a history of metastatic breast cancer, hypertension, diabetes mellitus, hyperlipidemia presenting with altered mental status. The patient is poor historian.  Much of the history is provided through the patient's daughter who lives in [place]. Apparently, the patient's daughter noted that the patient was sleepy and repeating herself on the phone on the evening of 08/26/2016. On the morning of 08/27/2016, the patient's cousin was unable to reach the patient, and so the neighbor to check up on the patient. The patient was found sitting on the commode and unable to get up and walk. EMS was activated, and the patient was transferred to sit in a chair and that done. She was noted to be hypotensive at that time and given 500 mL bolus of fluid. The patient refused transfer to the hospital. On the morning of 08/28/2016, the patient's cousin went to check up on the patient again, and the patient was noted not to have moved from the chair in the den from the previous day. Apparently, the patient was confused and noted to be incontinent of urine and stool. EMS was activated, and the patient was brought to the emergency room for further evaluation. The patient's daughter states that the patient has had a gradual functional decline since August 2017. In emergency department, the patient was noted to be hypokalemic. Further workup revealed WBC 11.6. Urinalysis was negative for pyuria. Ammonia was 26.  Pt was transferred from [facility] to [facility] on 1/21 for further neurological work up. EEG was done and showed no evidence of epileptiform activity.   Assessment/Plan: Acute metabolic encephalopathy - Dehydration and volume depletion contributing to her encephalopathy, ? Paraneoplastic syndrome  - TSH 2.106 - Ammonia 26 - serum B12--1645 - RPR--neg - MRI brain and  CT head both neg for metastasis - Urinalysis negative for pyuria - neurology team consulted, EEG done but not revealing etiology of the encephalopathy  - neuro does not feel LP needed at this time - will continue to monitor pt's status and if no clinical improvement in next 24-48 hours, will ask for LP to be done inpatient   Failure to thrive, severe PCM  - consulted nutrition, appreciate assistance  - provide ensure and encouraged oral intake   Dehydration - The patient presented with hemoconcentration and elevated BUN above her normal baseline - BP more stable, pt more alert this AM   Metastatic breast cancer - Initially diagnosed in March 2012 - Metastasis noted initially May 2014--metastasis to bone and liver - Has had history of pathologic fracture to the humerus - has been receiving eribulin, last does 07/23/2017 - appreciate Dr. [name] following   Osteonecrosis of the jaw - per Dr. Karmen Bongo, Bone was biopsied and sent to [facility] with result read as osteonecrosis of the jaw, no cancer or evidence of infection noted - 08/28/2016 CT neck right mandible with large bony sequestrum - started Amox/Clav that was ordered by her oral surgeon but pt did not fill Rx, today is day #5/7  Hypokalemia - supplemented and WNL this AM  - BMP In AM  Thrombocytopenia, pancytopenia  - possibly due to acute DVT and ? Eliquis  - currently no signs of bleeding  - Serum B12--1645 - TSH 2.106  Acute DVT Left Lower Extremity - venous duplex--+DVT left common iliac and left external iliac -  started apixaban, change to Heparin if LP planned    DM2 - A1C 4.6 - allow liberal control - no need for SSI  Hyperlipidemia - continue statin  Dysphagia - evaluated by speech-->dys 2 with thin liquids  Disposition Plan:   To be determined once mental status improves   Family Communication:  No family at bedside this AM.   Consultants:  Oncology, Neurology, PCT  Code Status:   DNR  DVT Prophylaxis:  apixaban  Subjective: Patient is encephalopathic. She denies any headache, chest pain, breath, abdominal pain, leg pain.   Objective: Vitals:   09/01/16 1422 09/01/16 2304 09/02/16 0533 09/02/16 1427  BP: (!) 72/45 133/62 125/71 (!) 141/73  Pulse: 78 80 67 87  Resp: 18 18 18 17   Temp: 98.4 F (36.9 C) 98.6 F (37 C) 97.5 F (36.4 C) 98.4 F (36.9 C)  TempSrc:  Oral Oral   SpO2: 100% 100% 96% 100%  Weight:      Height:        Intake/Output Summary (Last 24 hours) at 09/02/16 1649 Last data filed at 09/02/16 0446  Gross per 24 hour  Intake             1010 ml  Output              700 ml  Net              310 ml   Weight change:  Exam:   General:  Pt is alert, confused but able to follow simple commands   HEENT: No icterus, No thrush, No neck mass, [place]/AT  Cardiovascular: RRR, S1/S2, no rubs, no gallops  Respiratory: CTA bilaterally, no wheezing, no crackles, no rhonchi  Abdomen: Soft/+BS, non tender, non distended, no guarding  Extremities: 1 + LE edema, No lymphangitis, No petechiae, No rashes, no synovitis  Data Reviewed: I have personally reviewed following labs and imaging studies  Basic Metabolic Panel:  Recent Labs Lab 08/28/16 1659 08/29/16 0649 08/30/16 0356 09/01/16 0356 09/01/16 1435 09/02/16 0445  NA 141 144 141 141  --  141  K 2.6* 3.9 3.7 3.3*  --  3.7  CL 103 109 110 115*  --  113*  CO2 23 26 25  19*  --  20*  GLUCOSE 137* 109* 107* 83  --  103*  BUN 36* 30* 24* 15  --  12  CREATININE 0.65 0.44 0.53 0.53  --  0.46  CALCIUM 8.8* 8.4* 8.2* 7.9*  --  7.9*  MG  --   --  2.0  --  1.8  --    Liver Function Tests:  Recent Labs Lab 08/28/16 1659  AST 23  ALT 11*  ALKPHOS 72  BILITOT 2.0*  PROT 7.1  ALBUMIN 3.5    Recent Labs Lab 08/28/16 1659  AMMONIA 26   Coagulation Profile:  Recent Labs Lab 08/29/16 1110  INR 1.25   CBC:  Recent Labs Lab 08/28/16 1659 08/29/16 0649 08/30/16 0356  09/01/16 0356 09/02/16 0445  WBC 5.0 4.6 4.8 3.6* 3.7*  HGB 12.4 11.3* 10.3* 10.6* 11.3*  HCT 36.8 34.4* 31.8* 32.0* 34.6*  MCV 88.9 88.9 90.9 90.4 90.3  PLT 113* 96* 104* 86* 98*   CBG:  Recent Labs Lab 08/29/16 1146 08/29/16 1648 08/29/16 2114 08/30/16 0748 08/30/16 1135  GLUCAP 98 123* 86 98 101*   Urine analysis:    Component Value Date/Time   COLORURINE AMBER (A) 08/28/2016 2352   APPEARANCEUR CLEAR 08/28/2016 2352   LABSPEC >  1.046 (H) 08/28/2016 2352   PHURINE 6.0 08/28/2016 2352   GLUCOSEU NEGATIVE 08/28/2016 2352   GLUCOSEU NEGATIVE 10/19/2014 1246   HGBUR SMALL (A) 08/28/2016 2352   BILIRUBINUR NEGATIVE 08/28/2016 2352   KETONESUR 20 (A) 08/28/2016 2352   PROTEINUR 30 (A) 08/28/2016 2352   UROBILINOGEN 1.0 10/19/2014 1246   NITRITE NEGATIVE 08/28/2016 2352   LEUKOCYTESUR NEGATIVE 08/28/2016 2352   Recent Results (from the past 240 hour(s))  Urine culture     Status: None   Collection Time: 08/29/16 12:41 AM  Result Value Ref Range Status   Specimen Description URINE, RANDOM  Final   Special Requests NONE  Final   Culture   Final    NO GROWTH Performed at [facility], [place] [address] [place], [place] [address]    Report Status 08/30/2016 FINAL  Final    Scheduled Meds: . amoxicillin-clavulanate  1 tablet Oral Q12H  . apixaban  10 mg Oral BID   Followed by  . [START ON 09/05/2016] apixaban  5 mg Oral BID  . chlorhexidine  15 mL Mouth Rinse BID  . feeding supplement (ENSURE ENLIVE)  237 mL Oral TID BM  . levothyroxine  175 mcg Oral QAC breakfast  . mouth rinse  15 mL Mouth Rinse q12n4p  . potassium chloride  10 mEq Oral BID   Continuous Infusions: . 0.9 % NaCl with KCl 20 mEq / L 75 mL/hr at 09/02/16 1306   Procedures/Studies: Ct Head Wo Contrast  Result Date: 08/28/2016 CLINICAL DATA:  Altered mental status EXAM: CT HEAD WITHOUT CONTRAST TECHNIQUE: Contiguous axial images were obtained from the base of the skull through the vertex without  intravenous contrast. COMPARISON:  10/04/2015 FINDINGS: Brain: No intracranial hemorrhage, mass effect or midline shift. Mild cerebral atrophy. Mild periventricular chronic white matter disease. No acute cortical infarction. No mass lesion is noted on this unenhanced scan. Vascular: Atherosclerotic calcifications of carotid siphon are noted. Skull: No skull fracture. Sinuses/Orbits: No acute findings Other: None IMPRESSION: No acute intracranial abnormality. Mild cerebral atrophy. Mild periventricular probable chronic white matter disease. No acute cortical infarction. Electronically Signed   By: Lahoma Crocker M.D.   On: 08/28/2016 16:50   Ct Head W & Wo Contrast  Result Date: 09/01/2016 CLINICAL DATA:  Altered mental status. History of metastatic breast cancer. EXAM: CT HEAD WITHOUT AND WITH CONTRAST TECHNIQUE: Contiguous axial images were obtained from the base of the skull through the vertex without and with intravenous contrast CONTRAST:  24mL ISOVUE-300 IOPAMIDOL (ISOVUE-300) INJECTION 61% COMPARISON:  08/29/2016 brain MRI FINDINGS: Brain: No evidence of acute infarction, hemorrhage, hydrocephalus, extra-axial collection or mass lesion/mass effect. Mild for age generalized volume loss and white matter disease from chronic microvascular ischemia. Vascular: No hyperdense vessel or unexpected calcification. Visible vessels are patent. Broad basilar tip which contacts the third ventricular floor; on thinner section imaging in an earlier contrast phase on maxillofacial CT 08/28/2016 there is no aneurysm. Skull: No acute or aggressive finding. Sinuses/Orbits: Negative IMPRESSION: No acute finding.  No evidence of metastatic disease. Electronically Signed   By: Monte Fantasia M.D.   On: 09/01/2016 11:35   Ct Soft Tissue Neck W Contrast  Result Date: 08/28/2016 CLINICAL DATA:  Bleeding in mouth and throat. Severe halitosis. History of diabetes, metastatic breast cancer. EXAM: CT NECK WITH CONTRAST TECHNIQUE:  Multidetector CT imaging of the neck was performed using the standard protocol following the bolus administration of intravenous contrast. CONTRAST:  2mL ISOVUE-300 IOPAMIDOL (ISOVUE-300) INJECTION 61% COMPARISON:  CT neck Dec 26, 2015 FINDINGS: Pharynx and larynx: Normal. Salivary glands: Atrophic and nonacute. Thyroid: Status post thyroidectomy. Lymph nodes: No lymphadenopathy by CT size criteria. Vascular: Mild calcific atherosclerosis of the carotid bifurcations. Limited intracranial: Normal. Visualized orbits: Normal. Mastoids and visualized paranasal sinuses: Small bilateral maxillary mucosal retention cysts without paranasal sinus air-fluid levels. Mastoid air cells are well aerated. Skeleton: Large bony fragment RIGHT mandible body superior aspect with erosive margins. Interval loss of multiple RIGHT maxillary teeth. Severe C5-6 and moderate C6-7 degenerative discs. Multilevel severe facet arthropathy. No destructive bony lesions. Dental malocclusion Upper chest: LEFT chest Port-A-Cath. Lung apices are clear. No superior mediastinal lymphadenopathy. Other: None. IMPRESSION: Interval loss of multiple RIGHT mandible teeth. RIGHT mandible probable osteomyelitis with large bony sequestrum, less likely postprocedural fracture nonunion. Electronically Signed   By: Elon Alas M.D.   On: 08/28/2016 19:03   Mr Brain Wo Contrast  Result Date: 08/29/2016 CLINICAL DATA:  History of metastatic breast cancer. Acute presentation with altered mental status. Gait disturbance. Lethargy. EXAM: MRI HEAD WITHOUT CONTRAST TECHNIQUE: Multiplanar, multiecho pulse sequences of the brain and surrounding structures were obtained without intravenous contrast. COMPARISON:  Head CT 08/28/2016 FINDINGS: Brain: Diffusion imaging does not show any acute or subacute infarction or other cause of restricted diffusion. There is generalized brain atrophy with mild small vessel changes of the deep white matter. No cortical or large  vessel territory infarction. No evidence of mass lesion, hemorrhage, hydrocephalus or extra-axial collection. No pituitary mass. Vascular: Major vessels at the base of the brain show flow. Skull and upper cervical spine: Negative Sinuses/Orbits: Clear/normal Other: None significant IMPRESSION: No evidence of metastatic disease or other acute process. Age related atrophy with mild small vessel change of the deep white matter. Electronically Signed   By: Nelson Chimes M.D.   On: 08/29/2016 13:04   Mr Liver Wo Contrast  Result Date: 08/20/2016 CLINICAL DATA:  Breast cancer with bone and liver metastases. EXAM: MRI ABDOMEN WITHOUT CONTRAST TECHNIQUE: Multiplanar multisequence MR imaging was performed without the administration of intravenous contrast. COMPARISON:  06/04/2011 FINDINGS: Lower chest: No pleural fluid. Postoperative findings in right breast including diffuse edema and small fluid collection appear similar to previous exam. Hepatobiliary: Multifocal liver metastases are again identified. Index lesion within posterior right lobe of liver measures 3.5 x 5.0 cm, image 21 of series 4. On the previous exam this measured 4.9 x 6.0 cm. The index lesion within the lateral segment of left lobe of liver measures 5.0 x 3.3 cm, image 15 of series 4. This is compared with 5.0 x 3.3 cm previously. Index lesion within left lobe of liver measures 1.5 x 1.4 cm, image 18 of series 4. Previously 1.7 x 1.3 cm. Pancreas: No mass, inflammatory changes, or other parenchymal abnormality identified. Spleen:  Within normal limits in size and appearance. Adrenals/Urinary Tract: No masses identified. No evidence of hydronephrosis. Stomach/Bowel: Visualized portions within the abdomen are unremarkable. Vascular/Lymphatic: No pathologically enlarged lymph nodes identified. No abdominal aortic aneurysm demonstrated. Other:  No free fluid or fluid collections identified Musculoskeletal: Mild lumbar spondylosis and degenerative disc  disease. IMPRESSION: 1. Taking into account respiratory motion artifact on previous exam, the overall burden of hepatic metastasis is stable. 2. Lumbar spondylosis and degenerative disc disease. Electronically Signed   By: Kerby Moors M.D.   On: 08/20/2016 14:29   Faye Ramsay, MD  [facility] Pager [phone]  If 7PM-7AM, please contact night-coverage [email or web address] Password [email or web address] 09/02/2016, 4:49 PM   LOS: 4 days

## 2016-09-02 NOTE — Care Management Important Message (Signed)
Important Message  Patient Details  Name: Christy Harrell MRN: [id number] Date of Birth: Aug 20, 1940   Medicare Important Message Given:  Yes    Smith Mcnicholas [name] 09/02/2016, 1:42 PM

## 2016-09-02 NOTE — Progress Notes (Signed)
Subjective: No significant change overnight. Patient states that she feels more depressed today.  Exam: Vitals:   09/01/16 2304 09/02/16 0533  BP: 133/62 125/71  Pulse: 80 67  Resp: 18 18  Temp: 98.6 F (37 C) 97.5 F (36.4 C)      Gen: In bed, NAD MS: Patient is alert and able to follow commands. She knows that she is in the hospital but has difficulty with recalling what month and year it is. CN: Cranial nerves II through XII grossly intact Motor: During all extremities 4/5 Sensory: Intact throughout   Pertinent Labs/Diagnostics: None    Impression: 76 YO female with AMS, lethargy, FTT, depression now extremely lethargic. Etiology is unclear at this time.  CT head with contrast showed no leptomeningeal enhancementy, EEG to evaluate for subclinical seizures showed no epileptiform activity. . Electrolytes and ammonia are within normal limits.   Per oncology's note they're still concerned that this may be leptomeningeal spread of her breast cancer. They've suggested MRI with contrast however this is not able to be done secondary to the fact that she is allergic to gadolinium. CT of brain with contrast did not show any enhancement or any abnormalities. They've recommended an LP which would mean that patient would need to be off her uptake Samantha for several days at least 4 and then on a heparin drip. At this time from a neurological standpoint we do not feel that an LP would be indicated and if so this could be done as an outpatient. I've also talked to the PCP who agrees.  At this time no further recommendations, if oncology still feels an LP would be needed would recommend this be done as an outpatient after her anticoagulation has been stopped for multiple days.  Christy Quill PA-C [facility] [phone]  09/02/2016, 10:49 AM

## 2016-09-02 NOTE — Progress Notes (Signed)
Christy Harrell   DOB:10-13-1940   BU#:[id number]   EHO#:[id number]  Subjective: Christy Harrell is now alert and speaks clearly (was obtunded when I saw her 08/31/2016). However she is disoriented x3. Denies pain, though continues to make mouth mvements which may indicate some discomfort following her extractions; no family in room   Objective: elderly African American Harrell examined in bed Vitals:   09/01/16 2304 09/02/16 0533  BP: 133/62 125/71  Pulse: 80 67  Resp: 18 18  Temp: 98.6 F (37 C) 97.5 F (36.4 C)    Body mass index is 22.3 kg/m.  Intake/Output Summary (Last 24 hours) at 09/02/16 0816 Last data filed at 09/02/16 0446  Gross per 24 hour  Intake             1020 ml  Output              700 ml  Net              320 ml     Sclerae unicteric, EOMs intact, pupils round and equal  Oropharynx s/p multiple extractions  No cervical or supraclavicular adenopathy  Lungs no rales or wheezes--auscultated anterolaterally  Heart regular rate and rhythm  Abdomen soft, obese, NT,+BS  MSK: blateral UP llymphedema, chronic  Neuro nonfocal; does not know me, place or year; was not able to tell me if she had children; confabulates, does follow commands  Breast exam: deferred  CBG (last 3)   Recent Labs  08/30/16 1135  GLUCAP 101*     Labs:  Lab Results  Component Value Date   WBC 3.7 (L) 09/02/2016   HGB 11.3 (L) 09/02/2016   HCT 34.6 (L) 09/02/2016   MCV 90.3 09/02/2016   PLT 98 (L) 09/02/2016   NEUTROABS 8.8 (H) 07/23/2016    @LASTCHEMISTRY @  Urine Studies No results for input(s): UHGB, CRYS in the last 72 hours.  Invalid input(s): UACOL, UAPR, USPG, UPH, UTP, UGL, [place], UBIL, UNIT, UROB, [place], UEPI, UWBC, Junie Panning [place], [place], [place]  Basic Metabolic Panel:  Recent Labs Lab 08/28/16 1659 08/29/16 0649 08/30/16 0356 09/01/16 0356 09/01/16 1435 09/02/16 0445  NA 141 144 141 141  --  141  K 2.6* 3.9 3.7 3.3*  --  3.7  CL 103 109 110 115*  --  113*  CO2 23  26 25  19*  --  20*  GLUCOSE 137* 109* 107* 83  --  103*  BUN 36* 30* 24* 15  --  12  CREATININE 0.65 0.44 0.53 0.53  --  0.46  CALCIUM 8.8* 8.4* 8.2* 7.9*  --  7.9*  MG  --   --  2.0  --  1.8  --    GFR Estimated Creatinine Clearance: 43.6 mL/min (by C-G formula based on SCr of 0.46 mg/dL). Liver Function Tests:  Recent Labs Lab 08/28/16 1659  AST 23  ALT 11*  ALKPHOS 72  BILITOT 2.0*  PROT 7.1  ALBUMIN 3.5   No results for input(s): LIPASE, AMYLASE in the last 168 hours.  Recent Labs Lab 08/28/16 1659  AMMONIA 26   Coagulation profile  Recent Labs Lab 08/29/16 1110  INR 1.25    CBC:  Recent Labs Lab 08/28/16 1659 08/29/16 0649 08/30/16 0356 09/01/16 0356 09/02/16 0445  WBC 5.0 4.6 4.8 3.6* 3.7*  HGB 12.4 11.3* 10.3* 10.6* 11.3*  HCT 36.8 34.4* 31.8* 32.0* 34.6*  MCV 88.9 88.9 90.9 90.4 90.3  PLT 113* 96* 104* 86* 98*   Cardiac Enzymes: No results  for input(s): CKTOTAL, CKMB, CKMBINDEX, TROPONINI in the last 168 hours. BNP: Invalid input(s): POCBNP CBG:  Recent Labs Lab 08/29/16 1146 08/29/16 1648 08/29/16 2114 08/30/16 0748 08/30/16 1135  GLUCAP 98 123* 86 98 101*   D-Dimer No results for input(s): DDIMER in the last 72 hours. Hgb A1c No results for input(s): HGBA1C in the last 72 hours. Lipid Profile No results for input(s): CHOL, HDL, LDLCALC, TRIG, CHOLHDL, LDLDIRECT in the last 72 hours. Thyroid function studies No results for input(s): TSH, T4TOTAL, T3FREE, THYROIDAB in the last 72 hours.  Invalid input(s): FREET3 Anemia work up  Recent Labs  09/01/16 [place] 1,288*  FOLATE 2.7*   Microbiology Recent Results (from the past 240 hour(s))  Urine culture     Status: None   Collection Time: 08/29/16 12:41 AM  Result Value Ref Range Status   Specimen Description URINE, RANDOM  Final   Special Requests NONE  Final   Culture   Final    NO GROWTH Performed at [facility], [address], [place], [place]  [address]    Report Status 08/30/2016 FINAL  Final      Studies:  Ct Head W & Wo Contrast  Result Date: 09/01/2016 CLINICAL DATA:  Altered mental status. History of metastatic breast cancer. EXAM: CT HEAD WITHOUT AND WITH CONTRAST TECHNIQUE: Contiguous axial images were obtained from the base of the skull through the vertex without and with intravenous contrast CONTRAST:  31m ISOVUE-300 IOPAMIDOL (ISOVUE-300) INJECTION 61% COMPARISON:  08/29/2016 brain MRI FINDINGS: Brain: No evidence of acute infarction, hemorrhage, hydrocephalus, extra-axial collection or mass lesion/mass effect. Mild for age generalized volume loss and white matter disease from chronic microvascular ischemia. Vascular: No hyperdense vessel or unexpected calcification. Visible vessels are patent. Broad basilar tip which contacts the third ventricular floor; on thinner section imaging in an earlier contrast phase on maxillofacial CT 08/28/2016 there is no aneurysm. Skull: No acute or aggressive finding. Sinuses/Orbits: Negative IMPRESSION: No acute finding.  No evidence of metastatic disease. Electronically Signed   By: JMonte FantasiaM.D.   On: 09/01/2016 11:35    Assessment: 76y.o. Christy Harrell with stage IV breast cancer as of May 2014  (1)  status post right lower outer quadrant lumpectomy and axillary lymph node dissection 11/17/2010 for a pT2 pN1, stage IIB invasive ductal carcinoma, grade 2, estrogen and progesterone receptor positive, HER-2 negative,   (2) Oncotype DX recurrence score of 27 predicting a risk of distant recurrence of 18% with 5 years of tamoxifen (intermediate score);   (3)  status post radiation completed July of 2012,   (4) started anastrozole July 2012, discontinued June 2014 with evidence of metastatic spread to bone  (5) Chronic lymphedema in the right upper extremity.  (6) pathologic fracture of the left humerus along apparent lytic lesion, with no other bone lesions per bone scan  12/12/2012  (7) on 12/27/2012 underwent #1 intramedullary nail left pathologic proximal humerus fracture  #2 left shoulder rotator cuff repair  #3 left shoulder open bone biopsy with pathology showing metastatic breast adenocarcinoma, estrogen receptor 100% positive, progesterone receptor and HER-2 negative, with an MIB-1 of 26%. PET scan 01/13/2013 showed no other areas of disease   (8) status post 35 Gy to the left proximal humerus completed 03/13/2013(  (9) fulvestrant, started on 01/23/2013, stopped May 2017 with progression  (10) zolendronic acid given monthly, first dose 01/23/2013; changed to denosumab as of August 2015 because of access problems, being given every three  months             (a) May 2017 dose held after several extractions--on hold pending definitive dental clearance  (11) multiple liver lesions noted on scans April 2015              (12) letrozole added May 2015, stopped 04/18/14 because of progression in her liver noted on a chest CT performed on 03/14/14  (13) everolimus and exemestane started 04/19/14, stopped May 2017             (a) MRI of the abdomen 07/12/15 showed stable disease             (b) repeat abdominal MRI 12/04/2015 showed progression  (14) started CMF chemotherapy (cyclophosphamide, methotrexate, fluorouracil) 12/24/2015             (a) admitted 02/18/2016 with febrile neutropenia, negative cultures             (b) axillofacial CT scan 02/19/2016 shows no soft-tissue abscessbut lucencies in the roots of multiple upper and lower teeth may indicate periapical abscesses             (c) MRI of the liver 02/26/2016 shows disease progression: CMF discontinued  (15) started eribulin 03/31/2016, to be repeated days 1 and 8 of each cycle             (a) cycle 3 delayed one week because of malaise             (b) treatments switched to every 14 days beginning with cycle 4   (16) Left lower extremity DVT documented by doppler 08/29/2016-- on  apixaban   Plan:  I am concerned Thessie may have leptomeningeal spread of her breast cancer. I would suggest brain MRI with contrast and if that is negative and no other cause of confusion is found would suggest LP. She would have to be off apixaban several days for that procedure.   A diagnosis of meningeal carcinomatosis would have significant implications in terms of prognosis, treatment options, and consideration of Hospice referral. Her cancer is otherwise stable per recent restaging studies.  Note she is usually very well oriented and able to care for all her ADLs--she usually reminds me of when her next treatment is due, etc. This is a completely new situation.  Daughter is thinking of having the patient come to live with her in the future. At this point unless there is improvement agree she will need SNF. Also agree DNR is appropriate.  Chauncey Cruel, MD 09/02/2016  8:16 AM Medical Oncology and Hematology [facility] [address] [place], [place] [address] Tel. [phone]    Fax. [phone]

## 2016-09-02 NOTE — Progress Notes (Signed)
Speech Language Pathology Treatment: Dysphagia  Patient Details Name: Christy Harrell MRN: [id number] DOB: 06-18-1941 Today's Date: 09/02/2016 Time: [id number] SLP Time Calculation (min) (ACUTE ONLY): 16 min  Assessment / Plan / Recommendation Clinical Impression  Pt seen for potential to upgrade from Dys 2 texture. Pt eating breakfast requiring repositioning due to reclined position in bed. Significantly prolonged mastication and propulsion of pancake requiring verbal cues to clear, partially due to decreased ability to attend. No s/s aspiration with solids or liquids. Will continue Dys 2, thin however may need to downgrade texture.     HPI HPI: 76 year old female with a history of metastatic breast cancer, hypertension, diabetes mellitus, hyperlipidemia presenting with altered mental status. Pt also with osteonecrosis of the jaw with recent dental extraction and bone biopsy. CT Neck 1/19 showed large bony sequestrum.      SLP Plan  Continue with current plan of care     Recommendations  Diet recommendations: Dysphagia 2 (fine chop);Thin liquid Liquids provided via: Straw;Cup Medication Administration: Whole meds with puree Supervision: Patient able to self feed;Full supervision/cueing for compensatory strategies;Staff to assist with self feeding Compensations: Minimize environmental distractions;Slow rate;Small sips/bites;Lingual sweep for clearance of pocketing Postural Changes and/or Swallow Maneuvers: Seated upright 90 degrees                Oral Care Recommendations: Oral care BID Follow up Recommendations: 24 hour supervision/assistance;Skilled Nursing facility Plan: Continue with current plan of care       GO                [name] 09/02/2016, 10:44 AM   [name] Caroli.[employer] [phone]

## 2016-09-03 LAB — BASIC METABOLIC PANEL
ANION GAP: 4 — AB (ref 5–15)
Anion gap: 4 — ABNORMAL LOW (ref 5–15)
BUN: 8 mg/dL (ref 6–20)
BUN: 11 mg/dL (ref 6–20)
CALCIUM: 7.9 mg/dL — AB (ref 8.9–10.3)
CHLORIDE: 115 mmol/L — AB (ref 101–111)
CO2: 21 mmol/L — AB (ref 22–32)
CO2: 22 mmol/L (ref 22–32)
CREATININE: 0.51 mg/dL (ref 0.44–1.00)
Calcium: 7.8 mg/dL — ABNORMAL LOW (ref 8.9–10.3)
Chloride: 112 mmol/L — ABNORMAL HIGH (ref 101–111)
Creatinine, Ser: 0.51 mg/dL (ref 0.44–1.00)
GFR calc Af Amer: >60 mL/min (ref >60)
GFR calc non Af Amer: >60 mL/min (ref >60)
GLUCOSE: 116 mg/dL — AB (ref 65–99)
Glucose, Bld: 105 mg/dL — ABNORMAL HIGH (ref 65–99)
Potassium: 3.8 mmol/L (ref 3.5–5.1)
Potassium: 3.5 mmol/L (ref 3.5–5.1)
Sodium: 140 mmol/L (ref 135–145)
Sodium: 138 mmol/L (ref 135–145)

## 2016-09-03 LAB — CBC
HEMATOCRIT: 33 % — AB (ref 36.0–46.0)
HEMOGLOBIN: 10.9 g/dL — AB (ref 12.0–15.0)
MCH: 29.5 pg (ref 26.0–34.0)
MCHC: 33 g/dL (ref 30.0–36.0)
MCV: 89.2 fL (ref 78.0–100.0)
Platelets: 109 10*3/uL — ABNORMAL LOW (ref 150–400)
RBC: 3.7 MIL/uL — ABNORMAL LOW (ref 3.87–5.11)
RDW: 18.9 % — ABNORMAL HIGH (ref 11.5–15.5)
WBC: 4.3 10*3/uL (ref 4.0–10.5)

## 2016-09-03 MED ORDER — POTASSIUM CHLORIDE 20 MEQ PO PACK
10.0000 meq | PACK | Freq: Two times a day (BID) | ORAL | Status: DC
  Filled 2016-09-03 (×2): qty 1

## 2016-09-03 MED ORDER — POTASSIUM CHLORIDE CRYS ER 10 MEQ PO TBCR
10.0000 meq | EXTENDED_RELEASE_TABLET | Freq: Two times a day (BID) | ORAL | Status: DC
  Administered 2016-09-03: 10 meq via ORAL
  Filled 2016-09-03 (×2): qty 1

## 2016-09-03 NOTE — Progress Notes (Signed)
PROGRESS NOTE  Christy Harrell [id number] DOB: [birth year] DOA: 08/28/2016   PCP: [name], MD   Brief History:  76 year old female with a history of metastatic breast cancer, hypertension, diabetes mellitus, hyperlipidemia presenting with altered mental status. The patient is poor historian.  Much of the history is provided through the patient's daughter who lives in [place]. Apparently, the patient's daughter noted that the patient was sleepy and repeating herself on the phone on the evening of 08/26/2016. On the morning of 08/27/2016, the patient's cousin was unable to reach the patient, and so the neighbor to check up on the patient. The patient was found sitting on the commode and unable to get up and walk. EMS was activated, and the patient was transferred to sit in a chair and that done. She was noted to be hypotensive at that time and given 500 mL bolus of fluid. The patient refused transfer to the hospital. On the morning of 08/28/2016, the patient's cousin went to check up on the patient again, and the patient was noted not to have moved from the chair in the den from the previous day. Apparently, the patient was confused and noted to be incontinent of urine and stool. EMS was activated, and the patient was brought to the emergency room for further evaluation. The patient's daughter states that the patient has had a gradual functional decline since August 2017. In emergency department, the patient was noted to be hypokalemic. Further workup revealed WBC 11.6. Urinalysis was negative for pyuria. Ammonia was 26.  Pt was transferred from [facility] to [facility] on 1/21 for further neurological work up. EEG was done and showed no evidence of epileptiform activity.   Assessment/Plan: Acute metabolic encephalopathy - Dehydration and volume depletion contributing to her encephalopathy, ? Paraneoplastic syndrome  - TSH 2.106 - Ammonia 26 - serum B12--1645 - RPR--neg - MRI brain and  CT head both neg for metastasis - Urinalysis negative for pyuria - neurology team consulted, EEG done but not revealing etiology of the encephalopathy  - neuro does not feel LP needed at this time - I have discussed pt's clinical status with her oncologist Dr. [name] and he has recommended MRI brian with contrast to rule out carcinomatosis, if MRI reveals any pathology, LP may not be warranted   Failure to thrive, severe PCM  - consulted nutrition, appreciate assistance  - provide ensure and encouraged oral intake   Dehydration - The patient presented with hemoconcentration and elevated BUN above her normal baseline - BP more stable, pt more alert this AM but still rather weak and tired   Metastatic breast cancer - Initially diagnosed in March 2012 - Metastasis noted initially May 2014--metastasis to bone and liver - Has had history of pathologic fracture to the humerus - has been receiving eribulin, last does 07/23/2017 - appreciate Dr. [name] following   Osteonecrosis of the jaw - per Dr. Karmen Bongo, Bone was biopsied and sent to [facility] with result read as osteonecrosis of the jaw, no cancer or evidence of infection noted - 08/28/2016 CT neck right mandible with large bony sequestrum - started Amox/Clav that was ordered by her oral surgeon but pt did not fill Rx, today is day #6/7  Hypokalemia - supplemented and WNL this AM  - BMP In AM  Thrombocytopenia, pancytopenia  - possibly due to acute DVT and ? Eliquis  - currently no signs of bleeding  - Serum B12--1645 - TSH 2.106  Acute DVT Left Lower Extremity - venous duplex--+DVT left common iliac and left external iliac - started apixaban - if LP warranted in near future, will need to hold Apixaban for ~5 days   DM2 - A1C 4.6 - allow liberal control - no need for SSI  Hyperlipidemia - continue statin  Dysphagia - evaluated by speech-->dys 2 with thin liquids  Disposition Plan:   To be determined once  mental status improves   Family Communication:  No family at bedside this AM. Left message for daughter over the phone  Consultants:  Oncology, Neurology  Code Status:  DNR  DVT Prophylaxis:  apixaban  Subjective: Patient is encephalopathic. She denies any headache, chest pain, breath, abdominal pain, leg pain.   Objective: Vitals:   09/02/16 0533 09/02/16 1427 09/02/16 2200 09/03/16 0628  BP: 125/71 (!) 141/73 133/83 121/74  Pulse: 67 87 90 83  Resp: 18 17 18 18   Temp: 97.5 F (36.4 C) 98.4 F (36.9 C) 99.5 F (37.5 C) 99.1 F (37.3 C)  TempSrc: Oral  Oral Oral  SpO2: 96% 100% 100% 100%  Weight:      Height:        Intake/Output Summary (Last 24 hours) at 09/03/16 1435 Last data filed at 09/03/16 1000  Gross per 24 hour  Intake              910 ml  Output              400 ml  Net              510 ml   Weight change:  Exam:   General:  Pt is alert, confused but able to follow simple commands   HEENT: No icterus, No thrush, No neck mass, [place]/AT  Cardiovascular: RRR, S1/S2, no rubs, no gallops  Respiratory: CTA bilaterally, no wheezing, no crackles, no rhonchi  Abdomen: Soft/+BS, non tender, non distended, no guarding  Extremities: 1 + LE edema, No lymphangitis, No petechiae, No rashes, no synovitis  Data Reviewed: I have personally reviewed following labs and imaging studies  Basic Metabolic Panel:  Recent Labs Lab 08/29/16 0649 08/30/16 0356 09/01/16 0356 09/01/16 1435 09/02/16 0445 09/03/16 0515  NA 144 141 141  --  141 140  K 3.9 3.7 3.3*  --  3.7 3.8  CL 109 110 115*  --  113* 115*  CO2 26 25 19*  --  20* 21*  GLUCOSE 109* 107* 83  --  103* 105*  BUN 30* 24* 15  --  12 8  CREATININE 0.44 0.53 0.53  --  0.46 0.51  CALCIUM 8.4* 8.2* 7.9*  --  7.9* 7.8*  MG  --  2.0  --  1.8  --   --    Liver Function Tests:  Recent Labs Lab 08/28/16 1659  AST 23  ALT 11*  ALKPHOS 72  BILITOT 2.0*  PROT 7.1  ALBUMIN 3.5    Recent Labs Lab  08/28/16 1659  AMMONIA 26   Coagulation Profile:  Recent Labs Lab 08/29/16 1110  INR 1.25   CBC:  Recent Labs Lab 08/29/16 0649 08/30/16 0356 09/01/16 0356 09/02/16 0445 09/03/16 0515  WBC 4.6 4.8 3.6* 3.7* 4.3  HGB 11.3* 10.3* 10.6* 11.3* 10.9*  HCT 34.4* 31.8* 32.0* 34.6* 33.0*  MCV 88.9 90.9 90.4 90.3 89.2  PLT 96* 104* 86* 98* 109*   CBG:  Recent Labs Lab 08/29/16 1146 08/29/16 1648 08/29/16 2114 08/30/16 0748 08/30/16 1135  GLUCAP 98 123*  86 98 101*   Urine analysis:    Component Value Date/Time   COLORURINE AMBER (A) 08/28/2016 2352   APPEARANCEUR CLEAR 08/28/2016 2352   LABSPEC >1.046 (H) 08/28/2016 2352   PHURINE 6.0 08/28/2016 2352   GLUCOSEU NEGATIVE 08/28/2016 2352   GLUCOSEU NEGATIVE 10/19/2014 1246   HGBUR SMALL (A) 08/28/2016 2352   BILIRUBINUR NEGATIVE 08/28/2016 2352   KETONESUR 20 (A) 08/28/2016 2352   PROTEINUR 30 (A) 08/28/2016 2352   UROBILINOGEN 1.0 10/19/2014 1246   NITRITE NEGATIVE 08/28/2016 2352   LEUKOCYTESUR NEGATIVE 08/28/2016 2352   Recent Results (from the past 240 hour(s))  Urine culture     Status: None   Collection Time: 08/29/16 12:41 AM  Result Value Ref Range Status   Specimen Description URINE, RANDOM  Final   Special Requests NONE  Final   Culture   Final    NO GROWTH Performed at [facility], [place] [address], [place], [place] [address]    Report Status 08/30/2016 FINAL  Final    Scheduled Meds: . amoxicillin-clavulanate  1 tablet Oral Q12H  . apixaban  10 mg Oral BID   Followed by  . [START ON 09/05/2016] apixaban  5 mg Oral BID  . chlorhexidine  15 mL Mouth Rinse BID  . feeding supplement (ENSURE ENLIVE)  237 mL Oral TID BM  . levothyroxine  175 mcg Oral QAC breakfast  . mouth rinse  15 mL Mouth Rinse q12n4p  . potassium chloride  10 mEq Oral BID   Continuous Infusions: . 0.9 % NaCl with KCl 20 mEq / L 75 mL/hr at 09/03/16 0600   Procedures/Studies: Ct Head Wo Contrast  Result Date:  08/28/2016 CLINICAL DATA:  Altered mental status EXAM: CT HEAD WITHOUT CONTRAST TECHNIQUE: Contiguous axial images were obtained from the base of the skull through the vertex without intravenous contrast. COMPARISON:  10/04/2015 FINDINGS: Brain: No intracranial hemorrhage, mass effect or midline shift. Mild cerebral atrophy. Mild periventricular chronic white matter disease. No acute cortical infarction. No mass lesion is noted on this unenhanced scan. Vascular: Atherosclerotic calcifications of carotid siphon are noted. Skull: No skull fracture. Sinuses/Orbits: No acute findings Other: None IMPRESSION: No acute intracranial abnormality. Mild cerebral atrophy. Mild periventricular probable chronic white matter disease. No acute cortical infarction. Electronically Signed   By: Lahoma Crocker M.D.   On: 08/28/2016 16:50   Ct Head W & Wo Contrast  Result Date: 09/01/2016 CLINICAL DATA:  Altered mental status. History of metastatic breast cancer. EXAM: CT HEAD WITHOUT AND WITH CONTRAST TECHNIQUE: Contiguous axial images were obtained from the base of the skull through the vertex without and with intravenous contrast CONTRAST:  78mL ISOVUE-300 IOPAMIDOL (ISOVUE-300) INJECTION 61% COMPARISON:  08/29/2016 brain MRI FINDINGS: Brain: No evidence of acute infarction, hemorrhage, hydrocephalus, extra-axial collection or mass lesion/mass effect. Mild for age generalized volume loss and white matter disease from chronic microvascular ischemia. Vascular: No hyperdense vessel or unexpected calcification. Visible vessels are patent. Broad basilar tip which contacts the third ventricular floor; on thinner section imaging in an earlier contrast phase on maxillofacial CT 08/28/2016 there is no aneurysm. Skull: No acute or aggressive finding. Sinuses/Orbits: Negative IMPRESSION: No acute finding.  No evidence of metastatic disease. Electronically Signed   By: Monte Fantasia M.D.   On: 09/01/2016 11:35   Ct Soft Tissue Neck W  Contrast  Result Date: 08/28/2016 CLINICAL DATA:  Bleeding in mouth and throat. Severe halitosis. History of diabetes, metastatic breast cancer. EXAM: CT NECK WITH CONTRAST  TECHNIQUE: Multidetector CT imaging of the neck was performed using the standard protocol following the bolus administration of intravenous contrast. CONTRAST:  49mL ISOVUE-300 IOPAMIDOL (ISOVUE-300) INJECTION 61% COMPARISON:  CT neck Dec 26, 2015 FINDINGS: Pharynx and larynx: Normal. Salivary glands: Atrophic and nonacute. Thyroid: Status post thyroidectomy. Lymph nodes: No lymphadenopathy by CT size criteria. Vascular: Mild calcific atherosclerosis of the carotid bifurcations. Limited intracranial: Normal. Visualized orbits: Normal. Mastoids and visualized paranasal sinuses: Small bilateral maxillary mucosal retention cysts without paranasal sinus air-fluid levels. Mastoid air cells are well aerated. Skeleton: Large bony fragment RIGHT mandible body superior aspect with erosive margins. Interval loss of multiple RIGHT maxillary teeth. Severe C5-6 and moderate C6-7 degenerative discs. Multilevel severe facet arthropathy. No destructive bony lesions. Dental malocclusion Upper chest: LEFT chest Port-A-Cath. Lung apices are clear. No superior mediastinal lymphadenopathy. Other: None. IMPRESSION: Interval loss of multiple RIGHT mandible teeth. RIGHT mandible probable osteomyelitis with large bony sequestrum, less likely postprocedural fracture nonunion. Electronically Signed   By: Elon Alas M.D.   On: 08/28/2016 19:03   Mr Brain Wo Contrast  Result Date: 08/29/2016 CLINICAL DATA:  History of metastatic breast cancer. Acute presentation with altered mental status. Gait disturbance. Lethargy. EXAM: MRI HEAD WITHOUT CONTRAST TECHNIQUE: Multiplanar, multiecho pulse sequences of the brain and surrounding structures were obtained without intravenous contrast. COMPARISON:  Head CT 08/28/2016 FINDINGS: Brain: Diffusion imaging does not show  any acute or subacute infarction or other cause of restricted diffusion. There is generalized brain atrophy with mild small vessel changes of the deep white matter. No cortical or large vessel territory infarction. No evidence of mass lesion, hemorrhage, hydrocephalus or extra-axial collection. No pituitary mass. Vascular: Major vessels at the base of the brain show flow. Skull and upper cervical spine: Negative Sinuses/Orbits: Clear/normal Other: None significant IMPRESSION: No evidence of metastatic disease or other acute process. Age related atrophy with mild small vessel change of the deep white matter. Electronically Signed   By: Nelson Chimes M.D.   On: 08/29/2016 13:04   Mr Liver Wo Contrast  Result Date: 08/20/2016 CLINICAL DATA:  Breast cancer with bone and liver metastases. EXAM: MRI ABDOMEN WITHOUT CONTRAST TECHNIQUE: Multiplanar multisequence MR imaging was performed without the administration of intravenous contrast. COMPARISON:  06/04/2011 FINDINGS: Lower chest: No pleural fluid. Postoperative findings in right breast including diffuse edema and small fluid collection appear similar to previous exam. Hepatobiliary: Multifocal liver metastases are again identified. Index lesion within posterior right lobe of liver measures 3.5 x 5.0 cm, image 21 of series 4. On the previous exam this measured 4.9 x 6.0 cm. The index lesion within the lateral segment of left lobe of liver measures 5.0 x 3.3 cm, image 15 of series 4. This is compared with 5.0 x 3.3 cm previously. Index lesion within left lobe of liver measures 1.5 x 1.4 cm, image 18 of series 4. Previously 1.7 x 1.3 cm. Pancreas: No mass, inflammatory changes, or other parenchymal abnormality identified. Spleen:  Within normal limits in size and appearance. Adrenals/Urinary Tract: No masses identified. No evidence of hydronephrosis. Stomach/Bowel: Visualized portions within the abdomen are unremarkable. Vascular/Lymphatic: No pathologically enlarged  lymph nodes identified. No abdominal aortic aneurysm demonstrated. Other:  No free fluid or fluid collections identified Musculoskeletal: Mild lumbar spondylosis and degenerative disc disease. IMPRESSION: 1. Taking into account respiratory motion artifact on previous exam, the overall burden of hepatic metastasis is stable. 2. Lumbar spondylosis and degenerative disc disease. Electronically Signed   By: Queen Slough.D.  On: 08/20/2016 14:29   Faye Ramsay, MD  [facility] Pager [phone]  If 7PM-7AM, please contact night-coverage [email or web address] Password [email or web address] 09/03/2016, 2:35 PM   LOS: 5 days

## 2016-09-03 NOTE — Progress Notes (Signed)
ANTICOAGULATION CONSULT NOTE -Follow up  Pharmacy Consult for Eliquis Indication: new DVT LLE per venous doppler on 08/29/16  Allergies  Allergen Reactions  . Bydureon [Exenatide] Rash  . Gadolinium Derivatives Other (See Comments)    Pt began having chest numbness/tingling , stated her heart felt funny, had to take her to ED for EKG per RN   CAP  . Enalapril Rash    Patient Measurements: Height: 5' (152.4 cm) Weight: 114 lb 3.2 oz (51.8 kg) IBW/kg (Calculated) : 45.5 Heparin Dosing Weight:   Vital Signs: Temp: 99.1 F (37.3 C) (01/25 0628) Temp Source: Oral (01/25 0628) BP: 121/74 (01/25 0628) Pulse Rate: 83 (01/25 0628)  Labs:  Recent Labs  09/01/16 0356 09/02/16 0445 09/03/16 0515  HGB 10.6* 11.3* 10.9*  HCT 32.0* 34.6* 33.0*  PLT 86* 98* 109*  CREATININE 0.53 0.46 0.51    Estimated Creatinine Clearance: 43.6 mL/min (by C-G formula based on SCr of 0.51 mg/dL).   Medical History: Past Medical History:  Diagnosis Date  . Arthritis   . Cancer ([place])    right breast  . Club foot    chronic limp  . Diabetes mellitus    metformin  . GERD (gastroesophageal reflux disease)    watches diet  . History of radiation therapy 02/22/13- 03/13/13   left proximal humerus 3500 cGy 14 sessions  . Hyperlipidemia   . Hypertension   . Lymphedema    right arm  . Metastasis from malignant tumor of breast ([place])    left humerous  . Metastasis from malignant tumor of breast ([place])    liver  . Neuromuscular disorder ([facility])    lt arm numb sometimes  . Osteoporosis   . Thyroid disease   . Wears glasses     Assessment: Patient is a 76 y.o F with metastatic breast cancer s/p treatment with Eribulin, presented to the ED on 08/28/16 with AMS.  LE doppler on 1/20 showed extensive left leg DVT.  Started Eliquis on 08/30/15 for new DVT. Today is day #6/7 of the 10mg  BID dose, then on day#8 (09/05/16) the dose will decrease to 5 mg BID.  CBC low/stable with pltc improved up to 109k.  Dr.  Doyle Askew noted that Thrombocytopenia, pancytopenia  possibly due to acute DVT and ? Eliquis  - currently no signs of bleeding. Patient with acute metabolic encephalopathy.  - apixaban continued, Dr. Doyle Askew plans to change to IV Heparin if LP planned     Plan:  Continue on Eliquis 10 mg PO bid x7 days (thru 1/26), then on 1/27 reduce dose to  5 mg BID.  - monitor for s/s bleeding Eliquis education/discharge instructions entered in AVS. I will attempt to provide verbal Eliquis education to family today.   Thank you for allowing pharmacy to be part of this patients care team.  Nicole Cella, RPh Clinical Pharmacist Pager: [phone], [phone] or after 4PM [phone] 09/03/2016,2:43 PM

## 2016-09-03 NOTE — Progress Notes (Signed)
Physical Therapy Treatment Patient Details Name: Christy Harrell MRN: [id number] DOB: 1941/07/06 Today's Date: 09/03/2016    History of Present Illness Christy Harrell is a 76 y.o. female with medical history significant of hypothyroidism, metastatic breat cancer undergoing chemotherapy, HTN, HLD, and DM presenting with AMS. Positive for extensive acute deep vein thrombosis involving the left common and external iliac veins,  common femoral vein, femoral vein, popliteal vein, gastro veins and calf veins.    PT Comments    Pt accepting of therapy today but very limited due to extreme weakness and fatigue. R UE swollen with lymphedema, making it difficult to grasp during transfers; encouraged patient to lean away from R side to take pressure of weaker arm. Pt unable to self correct sitting posture without mod A. Unable to maintain standing balance after transfer to perform perianal care without sitting rest break due to fatigue. Communicated with nursing to use stedy to return pt to bed after dinner. Pt would benefit from sitting balance exercises to increase trunk control and standing endurance to increase LE strength if she allows   Follow Up Recommendations  SNF;Supervision/Assistance - 24 hour     Equipment Recommendations  Other (comment) (TBA at next venue.)    Recommendations for Other Services       Precautions / Restrictions Precautions Precautions: Fall Restrictions Weight Bearing Restrictions: No    Mobility  Bed Mobility Overal bed mobility: Needs Assistance Bed Mobility: Supine to Sit     Supine to sit: Max assist;+2 for physical assistance;HOB elevated     General bed mobility comments: VC's for sequencing and technique. Bed pad used for extensive assist of trunk to EOB. Pt able to initiate some LE movement but very difficult to control trunk and R UE.   Transfers Overall transfer level: Needs assistance Equipment used: 2 person hand held assist Transfers:  Sit to/from [facility] Sit to Stand: Max assist;+2 physical assistance Stand pivot transfers: Max assist;+2 physical assistance       General transfer comment: VC's for hand placement on therapist's arm for support -  she was able to maintain on L UE throughout transfers but more difficult on R UE. Bed pad used to initiate transfer to standing. Was able to take a few pivoting steps around to the chair.   Ambulation/Gait             General Gait Details: Was not able to progress gait training today   Stairs            Wheelchair Mobility    Modified Rankin (Stroke Patients Only)       Balance Overall balance assessment: Needs assistance Sitting-balance support: Feet unsupported;Bilateral upper extremity supported Sitting balance-Leahy Scale: Poor Sitting balance - Comments: Pt unable to maintain sitting balance for more than a few seconds without HHA. Continued to collapse to R UE even when cued not to.  Postural control: Posterior lean;Right lateral lean   Standing balance-Leahy Scale: Poor Standing balance comment: unable to maintain upright balance without +2 HHA. fatigued quickly during perianal care- 2 sit to stand trials                    Cognition Arousal/Alertness: Awake/alert Behavior During Therapy: Flat affect Overall Cognitive Status: Within Functional Limits for tasks assessed                 General Comments: nurse reported pt been out of it on and off all day but was accepting of treatment  Exercises      General Comments        Pertinent Vitals/Pain Pain Assessment: No/denies pain Pain Score: 0-No pain    Home Living                      Prior Function            PT Goals (current goals can now be found in the care plan section) Acute Rehab PT Goals Potential to Achieve Goals: Fair Progress towards PT goals: Progressing toward goals (slowly)    Frequency    Min 2X/week      PT  Plan      Co-evaluation             End of Session Equipment Utilized During Treatment:  (used chux pads during transfers) Activity Tolerance: Patient limited by fatigue;Patient limited by lethargy Patient left: in chair;with call bell/phone within reach;with chair alarm set     Time: [id number] PT Time Calculation (min) (ACUTE ONLY): 16 min  Charges:  $Therapeutic Activity: 8-22 mins                    G Codes:      [facility] 09/15/2016, 5:35 PM [place], [place] Pager [phone]

## 2016-09-03 NOTE — Progress Notes (Signed)
Speech Language Pathology Treatment: Dysphagia  Patient Details Name: Christy Harrell MRN: [id number] DOB: 11-Jan-1941 Today's Date: 09/03/2016 Time: [id number] SLP Time Calculation (min) (ACUTE ONLY): 17 min  Assessment / Plan / Recommendation Clinical Impression  Pt consumed Dysphagia 2/thin diet via straw usage with oral holding, left pocketing in lateral sulcus and moderate verbal cues needed to utilize compensatory strategies of lingual tongue sweep, liquid wash and initiation of the swallow; pt prefers small bites/sips and would often say "that bite is too big" during po intake which decreases her overall risk for aspiration; continue Dysphagia 2 (minced) diet/thin liquids via cup/straw; ST will f/u x1 for diet tolerance and review of swallowing precautions with family/caregivers/pt,  determine if possible downgrade prior to D/C is needed and if services will be needed at next venue of care.   HPI HPI: 76 year old female with a history of metastatic breast cancer, hypertension, diabetes mellitus, hyperlipidemia presenting with altered mental status. Pt also with osteonecrosis of the jaw with recent dental extraction and bone biopsy. CT Neck 1/19 showed large bony sequestrum.      SLP Plan  Continue with current plan of care     Recommendations  Liquids provided via: Straw;Cup Medication Administration: Whole meds with puree Supervision: Patient able to self feed;Staff to assist with self feeding;Full supervision/cueing for compensatory strategies Compensations: Minimize environmental distractions;Slow rate;Small sips/bites;Lingual sweep for clearance of pocketing;Follow solids with liquid Postural Changes and/or Swallow Maneuvers: Seated upright 90 degrees                Oral Care Recommendations: Oral care BID Follow up Recommendations: 24 hour supervision/assistance;Skilled Nursing facility Plan: Continue with current plan of care                       Dallana Mavity,PAT,  M.S., CCC-SLP 09/03/2016, 4:54 PM

## 2016-09-04 ENCOUNTER — Inpatient Hospital Stay (HOSPITAL_COMMUNITY): Payer: Medicare Other

## 2016-09-04 LAB — BASIC METABOLIC PANEL
ANION GAP: 4 — AB (ref 5–15)
BUN: 12 mg/dL (ref 6–20)
CO2: 22 mmol/L (ref 22–32)
Calcium: 8.4 mg/dL — ABNORMAL LOW (ref 8.9–10.3)
Chloride: 113 mmol/L — ABNORMAL HIGH (ref 101–111)
Creatinine, Ser: 0.52 mg/dL (ref 0.44–1.00)
Glucose, Bld: 122 mg/dL — ABNORMAL HIGH (ref 65–99)
POTASSIUM: 3.4 mmol/L — AB (ref 3.5–5.1)
SODIUM: 139 mmol/L (ref 135–145)

## 2016-09-04 LAB — CBC
HCT: 34 % — ABNORMAL LOW (ref 36.0–46.0)
HEMOGLOBIN: 11.1 g/dL — AB (ref 12.0–15.0)
MCH: 29.3 pg (ref 26.0–34.0)
MCHC: 32.6 g/dL (ref 30.0–36.0)
MCV: 89.7 fL (ref 78.0–100.0)
PLATELETS: 121 10*3/uL — AB (ref 150–400)
RBC: 3.79 MIL/uL — AB (ref 3.87–5.11)
RDW: 18.7 % — ABNORMAL HIGH (ref 11.5–15.5)
WBC: 5.7 10*3/uL (ref 4.0–10.5)

## 2016-09-04 LAB — URINE CULTURE: CULTURE: NO GROWTH

## 2016-09-04 MED ORDER — POTASSIUM CHLORIDE 20 MEQ/15ML (10%) PO SOLN
10.0000 meq | Freq: Two times a day (BID) | ORAL | Status: DC
  Administered 2016-09-04 – 2016-09-05 (×4): 10 meq via ORAL
  Filled 2016-09-04 (×4): qty 15

## 2016-09-04 MED ORDER — PREDNISONE 50 MG PO TABS
50.0000 mg | ORAL_TABLET | Freq: Four times a day (QID) | ORAL | Status: AC
  Administered 2016-09-04 (×3): 50 mg via ORAL
  Filled 2016-09-04 (×3): qty 1

## 2016-09-04 MED ORDER — DIPHENHYDRAMINE HCL 25 MG PO CAPS
50.0000 mg | ORAL_CAPSULE | Freq: Once | ORAL | Status: AC
  Administered 2016-09-04: 50 mg via ORAL
  Filled 2016-09-04: qty 2

## 2016-09-04 NOTE — Progress Notes (Signed)
PROGRESS NOTE  Corley Luke [id number] DOB: [birth year] DOA: 08/28/2016   PCP: [name], MD   Subjective: Still very somnolent, hard to awaken but able to tell her name, plan is to do MRI, if MRI is negative Oncology suggested LP. MRI postponed until tomorrow as patient reportedly allergic to gadolinium and has to get prednisone and Benadryl upfront. Poor oral intake secondary to somnolence  Brief History:  76 year old female with a history of metastatic breast cancer, hypertension, diabetes mellitus, hyperlipidemia presenting with altered mental status. The patient is poor historian.  Much of the history is provided through the patient's daughter who lives in [place]. Apparently, the patient's daughter noted that the patient was sleepy and repeating herself on the phone on the evening of 08/26/2016. On the morning of 08/27/2016, the patient's cousin was unable to reach the patient, and so the neighbor to check up on the patient. The patient was found sitting on the commode and unable to get up and walk. EMS was activated, and the patient was transferred to sit in a chair and that done. She was noted to be hypotensive at that time and given 500 mL bolus of fluid. The patient refused transfer to the hospital. On the morning of 08/28/2016, the patient's cousin went to check up on the patient again, and the patient was noted not to have moved from the chair in the den from the previous day. Apparently, the patient was confused and noted to be incontinent of urine and stool. EMS was activated, and the patient was brought to the emergency room for further evaluation. The patient's daughter states that the patient has had a gradual functional decline since August 2017. In emergency department, the patient was noted to be hypokalemic. Further workup revealed WBC 11.6. Urinalysis was negative for pyuria. Ammonia was 26.  Pt was transferred from [facility] to [facility] on 1/21 for further  neurological work up. EEG was done and showed no evidence of epileptiform activity.   Assessment/Plan: Acute metabolic encephalopathy - Dehydration and volume depletion contributing to her encephalopathy, ? Paraneoplastic syndrome  - TSH 2.106 - Ammonia 26 - serum B12--1645 - RPR--neg - MRI of the brain without contrast and CT head both neg for metastasis. - Urinalysis negative for pyuria - neurology team consulted, EEG done but not revealing etiology of the encephalopathy  - neuro does not feel LP needed at this time - Dr. Doyle Askew discussed pt's clinical status with her oncologist Dr. [name] and he has recommended MRI brian with contrast to rule out carcinomatosis, if MRI reveals any pathology, LP may not be warranted. -No sedative medications, no evidence of infection or meningeal signs. -If this is does not improve, as patient has metastatic breast cancer I'll recommend to obtain a palliative consult.  Failure to thrive, severe PCM  - consulted nutrition, appreciate assistance  - Poor oral intake (virtually she is not eating, per staff she ate half a cup of applesauce)  Dehydration - The patient presented with hemoconcentration and elevated BUN above her normal baseline - BP more stable, pt more alert this AM but still rather weak and tired   Metastatic breast cancer - Initially diagnosed in March 2012 - Metastasis noted initially May 2014--metastasis to bone and liver - Has had history of pathologic fracture to the humerus - has been receiving eribulin, last does 07/23/2017 - appreciate Dr. [name] following   Osteonecrosis of the jaw - per Dr. Karmen Bongo,  Bone was biopsied and sent to [facility] with result read as osteonecrosis of the jaw, no cancer or evidence of infection noted - 08/28/2016 CT neck right mandible with large bony sequestrum - started Amox/Clav that was ordered by her oral surgeon but pt did not fill Rx, today is day #6/7  Hypokalemia - supplemented  and WNL this AM  - BMP In AM  Thrombocytopenia, pancytopenia  - possibly due to acute DVT and ? Eliquis  - currently no signs of bleeding  - Serum B12--1645 - TSH 2.106  Acute DVT Left Lower Extremity - venous duplex--+DVT left common iliac and left external iliac - started apixaban - if LP warranted in near future, will need to hold Apixaban for ~5 days   DM2 - A1C 4.6 - allow liberal control - no need for SSI  Hyperlipidemia - continue statin  Dysphagia - evaluated by speech-->dys 2 with thin liquids  Disposition Plan:   To be determined once mental status improves  Family Communication:  No family at bedside this AM. Left message for daughter over the phone Consultants:  Oncology, Neurology Code Status:  DNR  DVT Prophylaxis:  apixaban    Objective: Vitals:   09/03/16 1526 09/03/16 2219 09/04/16 0521 09/04/16 0528  BP: 119/81 106/60 (!) 102/56 117/61  Pulse: 83 88 91 63  Resp: 16 18 18 18   Temp: 98.1 F (36.7 C) 98 F (36.7 C) 99 F (37.2 C) 97.7 F (36.5 C)  TempSrc: Oral Oral Oral Oral  SpO2: 98% 100% 100% 99%  Weight:      Height:        Intake/Output Summary (Last 24 hours) at 09/04/16 1347 Last data filed at 09/04/16 0426  Gross per 24 hour  Intake              945 ml  Output              300 ml  Net              645 ml   Weight change:  Exam:   General:  Pt is alert, confused but able to follow simple commands   HEENT: No icterus, No thrush, No neck mass, [place]/AT  Cardiovascular: RRR, S1/S2, no rubs, no gallops  Respiratory: CTA bilaterally, no wheezing, no crackles, no rhonchi  Abdomen: Soft/+BS, non tender, non distended, no guarding  Extremities: 1 + LE edema, No lymphangitis, No petechiae, No rashes, no synovitis  Data Reviewed: I have personally reviewed following labs and imaging studies  Basic Metabolic Panel:  Recent Labs Lab 08/30/16 0356 09/01/16 0356 09/01/16 1435 09/02/16 0445 09/03/16 0515 09/03/16 1530  09/04/16 0415  NA 141 141  --  141 140 138 139  K 3.7 3.3*  --  3.7 3.8 3.5 3.4*  CL 110 115*  --  113* 115* 112* 113*  CO2 25 19*  --  20* 21* 22 22  GLUCOSE 107* 83  --  103* 105* 116* 122*  BUN 24* 15  --  12 8 11 12   CREATININE 0.53 0.53  --  0.46 0.51 0.51 0.52  CALCIUM 8.2* 7.9*  --  7.9* 7.8* 7.9* 8.4*  MG 2.0  --  1.8  --   --   --   --    Liver Function Tests:  Recent Labs Lab 08/28/16 1659  AST 23  ALT 11*  ALKPHOS 72  BILITOT 2.0*  PROT 7.1  ALBUMIN 3.5    Recent Labs Lab 08/28/16 1659  AMMONIA 26   Coagulation Profile:  Recent Labs Lab 08/29/16 1110  INR 1.25   CBC:  Recent Labs Lab 08/30/16 0356 09/01/16 0356 09/02/16 0445 09/03/16 0515 09/04/16 0415  WBC 4.8 3.6* 3.7* 4.3 5.7  HGB 10.3* 10.6* 11.3* 10.9* 11.1*  HCT 31.8* 32.0* 34.6* 33.0* 34.0*  MCV 90.9 90.4 90.3 89.2 89.7  PLT 104* 86* 98* 109* 121*   CBG:  Recent Labs Lab 08/29/16 1146 08/29/16 1648 08/29/16 2114 08/30/16 0748 08/30/16 1135  GLUCAP 98 123* 86 98 101*   Urine analysis:    Component Value Date/Time   COLORURINE AMBER (A) 08/28/2016 2352   APPEARANCEUR CLEAR 08/28/2016 2352   LABSPEC >1.046 (H) 08/28/2016 2352   PHURINE 6.0 08/28/2016 2352   GLUCOSEU NEGATIVE 08/28/2016 2352   GLUCOSEU NEGATIVE 10/19/2014 1246   HGBUR SMALL (A) 08/28/2016 2352   BILIRUBINUR NEGATIVE 08/28/2016 2352   KETONESUR 20 (A) 08/28/2016 2352   PROTEINUR 30 (A) 08/28/2016 2352   UROBILINOGEN 1.0 10/19/2014 1246   NITRITE NEGATIVE 08/28/2016 2352   LEUKOCYTESUR NEGATIVE 08/28/2016 2352   Recent Results (from the past 240 hour(s))  Urine culture     Status: None   Collection Time: 08/29/16 12:41 AM  Result Value Ref Range Status   Specimen Description URINE, RANDOM  Final   Special Requests NONE  Final   Culture   Final    NO GROWTH Performed at [facility], [place] [address], [place], [place] [address]    Report Status 08/30/2016 FINAL  Final  Culture, Urine     Status:  None   Collection Time: 09/03/16  7:09 AM  Result Value Ref Range Status   Specimen Description URINE, CATHETERIZED  Final   Special Requests NONE  Final   Culture NO GROWTH  Final   Report Status 09/04/2016 FINAL  Final    Scheduled Meds: . amoxicillin-clavulanate  1 tablet Oral Q12H  . apixaban  10 mg Oral BID   Followed by  . [START ON 09/05/2016] apixaban  5 mg Oral BID  . chlorhexidine  15 mL Mouth Rinse BID  . diphenhydrAMINE  50 mg Oral Once  . feeding supplement (ENSURE ENLIVE)  237 mL Oral TID BM  . levothyroxine  175 mcg Oral QAC breakfast  . mouth rinse  15 mL Mouth Rinse q12n4p  . potassium chloride  10 mEq Oral BID  . predniSONE  50 mg Oral Q6H   Continuous Infusions: . 0.9 % NaCl with KCl 20 mEq / L 75 mL/hr at 09/03/16 0600   Procedures/Studies: Ct Head Wo Contrast  Result Date: 08/28/2016 CLINICAL DATA:  Altered mental status EXAM: CT HEAD WITHOUT CONTRAST TECHNIQUE: Contiguous axial images were obtained from the base of the skull through the vertex without intravenous contrast. COMPARISON:  10/04/2015 FINDINGS: Brain: No intracranial hemorrhage, mass effect or midline shift. Mild cerebral atrophy. Mild periventricular chronic white matter disease. No acute cortical infarction. No mass lesion is noted on this unenhanced scan. Vascular: Atherosclerotic calcifications of carotid siphon are noted. Skull: No skull fracture. Sinuses/Orbits: No acute findings Other: None IMPRESSION: No acute intracranial abnormality. Mild cerebral atrophy. Mild periventricular probable chronic white matter disease. No acute cortical infarction. Electronically Signed   By: Lahoma Crocker M.D.   On: 08/28/2016 16:50   Ct Head W & Wo Contrast  Result Date: 09/01/2016 CLINICAL DATA:  Altered mental status. History of metastatic breast cancer. EXAM: CT HEAD WITHOUT AND WITH CONTRAST TECHNIQUE: Contiguous axial images were obtained from the  base of the skull through the vertex without and with  intravenous contrast CONTRAST:  86mL ISOVUE-300 IOPAMIDOL (ISOVUE-300) INJECTION 61% COMPARISON:  08/29/2016 brain MRI FINDINGS: Brain: No evidence of acute infarction, hemorrhage, hydrocephalus, extra-axial collection or mass lesion/mass effect. Mild for age generalized volume loss and white matter disease from chronic microvascular ischemia. Vascular: No hyperdense vessel or unexpected calcification. Visible vessels are patent. Broad basilar tip which contacts the third ventricular floor; on thinner section imaging in an earlier contrast phase on maxillofacial CT 08/28/2016 there is no aneurysm. Skull: No acute or aggressive finding. Sinuses/Orbits: Negative IMPRESSION: No acute finding.  No evidence of metastatic disease. Electronically Signed   By: Monte Fantasia M.D.   On: 09/01/2016 11:35   Ct Soft Tissue Neck W Contrast  Result Date: 08/28/2016 CLINICAL DATA:  Bleeding in mouth and throat. Severe halitosis. History of diabetes, metastatic breast cancer. EXAM: CT NECK WITH CONTRAST TECHNIQUE: Multidetector CT imaging of the neck was performed using the standard protocol following the bolus administration of intravenous contrast. CONTRAST:  3mL ISOVUE-300 IOPAMIDOL (ISOVUE-300) INJECTION 61% COMPARISON:  CT neck Dec 26, 2015 FINDINGS: Pharynx and larynx: Normal. Salivary glands: Atrophic and nonacute. Thyroid: Status post thyroidectomy. Lymph nodes: No lymphadenopathy by CT size criteria. Vascular: Mild calcific atherosclerosis of the carotid bifurcations. Limited intracranial: Normal. Visualized orbits: Normal. Mastoids and visualized paranasal sinuses: Small bilateral maxillary mucosal retention cysts without paranasal sinus air-fluid levels. Mastoid air cells are well aerated. Skeleton: Large bony fragment RIGHT mandible body superior aspect with erosive margins. Interval loss of multiple RIGHT maxillary teeth. Severe C5-6 and moderate C6-7 degenerative discs. Multilevel severe facet arthropathy. No  destructive bony lesions. Dental malocclusion Upper chest: LEFT chest Port-A-Cath. Lung apices are clear. No superior mediastinal lymphadenopathy. Other: None. IMPRESSION: Interval loss of multiple RIGHT mandible teeth. RIGHT mandible probable osteomyelitis with large bony sequestrum, less likely postprocedural fracture nonunion. Electronically Signed   By: Elon Alas M.D.   On: 08/28/2016 19:03   Mr Brain Wo Contrast  Result Date: 08/29/2016 CLINICAL DATA:  History of metastatic breast cancer. Acute presentation with altered mental status. Gait disturbance. Lethargy. EXAM: MRI HEAD WITHOUT CONTRAST TECHNIQUE: Multiplanar, multiecho pulse sequences of the brain and surrounding structures were obtained without intravenous contrast. COMPARISON:  Head CT 08/28/2016 FINDINGS: Brain: Diffusion imaging does not show any acute or subacute infarction or other cause of restricted diffusion. There is generalized brain atrophy with mild small vessel changes of the deep white matter. No cortical or large vessel territory infarction. No evidence of mass lesion, hemorrhage, hydrocephalus or extra-axial collection. No pituitary mass. Vascular: Major vessels at the base of the brain show flow. Skull and upper cervical spine: Negative Sinuses/Orbits: Clear/normal Other: None significant IMPRESSION: No evidence of metastatic disease or other acute process. Age related atrophy with mild small vessel change of the deep white matter. Electronically Signed   By: Nelson Chimes M.D.   On: 08/29/2016 13:04   Mr Liver Wo Contrast  Result Date: 08/20/2016 CLINICAL DATA:  Breast cancer with bone and liver metastases. EXAM: MRI ABDOMEN WITHOUT CONTRAST TECHNIQUE: Multiplanar multisequence MR imaging was performed without the administration of intravenous contrast. COMPARISON:  06/04/2011 FINDINGS: Lower chest: No pleural fluid. Postoperative findings in right breast including diffuse edema and small fluid collection appear similar  to previous exam. Hepatobiliary: Multifocal liver metastases are again identified. Index lesion within posterior right lobe of liver measures 3.5 x 5.0 cm, image 21 of series 4. On the previous exam this measured 4.9 x  6.0 cm. The index lesion within the lateral segment of left lobe of liver measures 5.0 x 3.3 cm, image 15 of series 4. This is compared with 5.0 x 3.3 cm previously. Index lesion within left lobe of liver measures 1.5 x 1.4 cm, image 18 of series 4. Previously 1.7 x 1.3 cm. Pancreas: No mass, inflammatory changes, or other parenchymal abnormality identified. Spleen:  Within normal limits in size and appearance. Adrenals/Urinary Tract: No masses identified. No evidence of hydronephrosis. Stomach/Bowel: Visualized portions within the abdomen are unremarkable. Vascular/Lymphatic: No pathologically enlarged lymph nodes identified. No abdominal aortic aneurysm demonstrated. Other:  No free fluid or fluid collections identified Musculoskeletal: Mild lumbar spondylosis and degenerative disc disease. IMPRESSION: 1. Taking into account respiratory motion artifact on previous exam, the overall burden of hepatic metastasis is stable. 2. Lumbar spondylosis and degenerative disc disease. Electronically Signed   By: Kerby Moors M.D.   On: 08/20/2016 14:29   Nazirah Tri A, MD  [facility] Pager [phone]  If 7PM-7AM, please contact night-coverage [email or web address] Password [email or web address] 09/04/2016, 1:47 PM   LOS: 6 days

## 2016-09-04 NOTE — Progress Notes (Signed)
Family chooses [facility] for SNF- facility updated.  CSW will continue to follow and assist with transfer when medically stable.  Jorge Ny, LCSW Clinical Social Worker [phone]

## 2016-09-04 NOTE — Progress Notes (Signed)
benadryl should be given 1 hr before going to MRI, MRI will notify RN when benadryl should be given

## 2016-09-05 ENCOUNTER — Inpatient Hospital Stay (HOSPITAL_COMMUNITY): Payer: Medicare Other

## 2016-09-05 LAB — GLUCOSE, CAPILLARY: Glucose-Capillary: 125 mg/dL — ABNORMAL HIGH (ref 65–99)

## 2016-09-05 MED ORDER — ENOXAPARIN SODIUM 80 MG/0.8ML ~~LOC~~ SOLN
1.5000 mg/kg | SUBCUTANEOUS | Status: DC
  Administered 2016-09-05 – 2016-09-07 (×3): 80 mg via SUBCUTANEOUS
  Filled 2016-09-05 (×3): qty 0.8

## 2016-09-05 MED ORDER — GADOBENATE DIMEGLUMINE 529 MG/ML IV SOLN
10.0000 mL | Freq: Once | INTRAVENOUS | Status: AC | PRN
  Administered 2016-09-05: 10 mL via INTRAVENOUS

## 2016-09-05 NOTE — Progress Notes (Signed)
PROGRESS NOTE  [place] [id number] DOB: 25-Jul-1941 DOA: 08/28/2016   PCP: [name], MD   Subjective: This morning, no acute complaints, more awake and oriented and following all the commands.  Brief History:  76 year old female with a history of metastatic breast cancer, hypertension, diabetes mellitus, hyperlipidemia presenting with altered mental status. The patient is poor historian.  Much of the history is provided through the patient's daughter who lives in [place]. Apparently, the patient's daughter noted that the patient was sleepy and repeating herself on the phone on the evening of 08/26/2016. On the morning of 08/27/2016, the patient's cousin was unable to reach the patient, and so the neighbor to check up on the patient. The patient was found sitting on the commode and unable to get up and walk. EMS was activated, and the patient was transferred to sit in a chair and that done. She was noted to be hypotensive at that time and given 500 mL bolus of fluid. The patient refused transfer to the hospital. On the morning of 08/28/2016, the patient's cousin went to check up on the patient again, and the patient was noted not to have moved from the chair in the den from the previous day. Apparently, the patient was confused and noted to be incontinent of urine and stool. EMS was activated, and the patient was brought to the emergency room for further evaluation. The patient's daughter states that the patient has had a gradual functional decline since August 2017. In emergency department, the patient was noted to be hypokalemic. Further workup revealed WBC 11.6. Urinalysis was negative for pyuria. Ammonia was 26.  Pt was transferred from [facility] on 1/21 for further neurological work up. EEG was done and showed no evidence of epileptiform activity.   Assessment/Plan: Acute metabolic encephalopathy - Dehydration and volume depletion contributing to her  encephalopathy, ? Paraneoplastic syndrome  - TSH 2.106 - Ammonia 26 - serum B12--1645 - RPR--neg - MRI of the brain without contrast and CT head both neg for metastasis. - Urinalysis negative for pyuria - neurology team consulted, EEG done but not revealing etiology of the encephalopathy  - neuro does not feel LP needed at this time - Dr. Doyle Askew discussed pt's clinical status with her oncologist Dr. [name] and he has recommended MRI brian with contrast to rule out carcinomatosis, if MRI reveals any pathology, LP may not be warranted. -No sedative medications, no evidence of infection or meningeal signs. At present patient is showing improvement, not sure whether LP is warranted or not but I will transition patient from Apixaban to Lovenox to get ready for LP 5 days down the road. Patient will follow up with oncology as an outpatient prior to that.  Pt shows significant improvement in her mentation and likely close to her baseline. I discussed Dr. [name] he is okay with discharging the patient at present  Failure to thrive, severe PCM  - consulted nutrition, appreciate assistance  - Poor oral intake (virtually she is not eating, per staff she ate half a cup of applesauce)  Dehydration - The patient presented with hemoconcentration and elevated BUN above her normal baseline - BP more stable, pt more alert this AM but still rather weak and tired   Metastatic breast cancer - Initially diagnosed in March 2012 - Metastasis noted initially May 2014--metastasis to bone and liver - Has had history of pathologic fracture to the humerus - has been receiving eribulin,  last does 07/23/2017 - appreciate Dr. [name] following   Osteonecrosis of the jaw - per Dr. Karmen Bongo, Bone was biopsied and sent to [facility] with result read as osteonecrosis of the jaw, no cancer or evidence of infection noted - 08/28/2016 CT neck right mandible with large bony sequestrum - started Amox/Clav that was  ordered by her oral surgeon but pt did not fill Rx, completed treatment here in the hospital  Hypokalemia Monitor  Thrombocytopenia, pancytopenia  - possibly due to acute DVT and ? Eliquis  - currently no signs of bleeding  - Serum B12--1645 - TSH 2.106  Acute DVT Left Lower Extremity - venous duplex--+DVT left common iliac and left external iliac - started apixaban - if LP warranted in near future, will need to hold Apixaban for ~5 days therefore switch him to Lovenox in the hospital and will continue at SNF.  DM2 - A1C 4.6 - allow liberal control - no need for SSI  Hyperlipidemia - continue statin  Dysphagia - evaluated by speech-->dys 2 with thin liquids  Disposition Plan:    09/06/2016 Family Communication:  No family at bedside this AM. Discussed with daughter over the phone.  Consultants:  Oncology, Neurology Code Status:  DNR  DVT Prophylaxis:  apixaban    Objective: Vitals:   09/04/16 1351 09/04/16 2146 09/05/16 0607 09/05/16 1442  BP: 118/81 112/73 123/77 132/80  Pulse: 76 83 (!) 56 96  Resp: 16 18 18 18   Temp: 97.3 F (36.3 C) 98.9 F (37.2 C) 98.2 F (36.8 C) 97.9 F (36.6 C)  TempSrc: Oral Oral Oral Oral  SpO2: 95% 100% 100% 99%  Weight:      Height:       No intake or output data in the 24 hours ending 09/05/16 1652 Weight change:  Exam:   General:  Pt is alert, Oriented 3 but able to follow simple commands   HEENT: No icterus, No thrush, No neck mass, [place]/AT  Cardiovascular: RRR, S1/S2, no rubs, no gallops  Respiratory: CTA bilaterally, no wheezing, no crackles, no rhonchi  Abdomen: Soft/+BS, non tender, non distended, no guarding  Extremities: 1 + LE edema, No lymphangitis, No petechiae, No rashes, no synovitis  Data Reviewed: I have personally reviewed following labs and imaging studies  Basic Metabolic Panel:  Recent Labs Lab 08/30/16 0356 09/01/16 0356 09/01/16 1435 09/02/16 0445 09/03/16 0515 09/03/16 1530  09/04/16 0415  NA 141 141  --  141 140 138 139  K 3.7 3.3*  --  3.7 3.8 3.5 3.4*  CL 110 115*  --  113* 115* 112* 113*  CO2 25 19*  --  20* 21* 22 22  GLUCOSE 107* 83  --  103* 105* 116* 122*  BUN 24* 15  --  12 8 11 12   CREATININE 0.53 0.53  --  0.46 0.51 0.51 0.52  CALCIUM 8.2* 7.9*  --  7.9* 7.8* 7.9* 8.4*  MG 2.0  --  1.8  --   --   --   --    Liver Function Tests: No results for input(s): AST, ALT, ALKPHOS, BILITOT, PROT, ALBUMIN in the last 168 hours. No results for input(s): AMMONIA in the last 168 hours. Coagulation Profile: No results for input(s): INR, PROTIME in the last 168 hours. CBC:  Recent Labs Lab 08/30/16 0356 09/01/16 0356 09/02/16 0445 09/03/16 0515 09/04/16 0415  WBC 4.8 3.6* 3.7* 4.3 5.7  HGB 10.3* 10.6* 11.3* 10.9* 11.1*  HCT 31.8* 32.0* 34.6* 33.0* 34.0*  MCV  90.9 90.4 90.3 89.2 89.7  PLT 104* 86* 98* 109* 121*   CBG:  Recent Labs Lab 08/29/16 2114 08/30/16 0748 08/30/16 1135  GLUCAP 86 98 101*   Urine analysis:    Component Value Date/Time   COLORURINE AMBER (A) 08/28/2016 2352   APPEARANCEUR CLEAR 08/28/2016 2352   LABSPEC >1.046 (H) 08/28/2016 2352   PHURINE 6.0 08/28/2016 2352   GLUCOSEU NEGATIVE 08/28/2016 2352   GLUCOSEU NEGATIVE 10/19/2014 1246   HGBUR SMALL (A) 08/28/2016 2352   BILIRUBINUR NEGATIVE 08/28/2016 2352   KETONESUR 20 (A) 08/28/2016 2352   PROTEINUR 30 (A) 08/28/2016 2352   UROBILINOGEN 1.0 10/19/2014 1246   NITRITE NEGATIVE 08/28/2016 2352   LEUKOCYTESUR NEGATIVE 08/28/2016 2352   Recent Results (from the past 240 hour(s))  Urine culture     Status: None   Collection Time: 08/29/16 12:41 AM  Result Value Ref Range Status   Specimen Description URINE, RANDOM  Final   Special Requests NONE  Final   Culture   Final    NO GROWTH Performed at [facility], [place] [address] [place], [place] [address]    Report Status 08/30/2016 FINAL  Final  Culture, Urine     Status: None   Collection Time: 09/03/16   7:09 AM  Result Value Ref Range Status   Specimen Description URINE, CATHETERIZED  Final   Special Requests NONE  Final   Culture NO GROWTH  Final   Report Status 09/04/2016 FINAL  Final    Scheduled Meds: . chlorhexidine  15 mL Mouth Rinse BID  . enoxaparin (LOVENOX) injection  1.5 mg/kg Subcutaneous Q24H  . feeding supplement (ENSURE ENLIVE)  237 mL Oral TID BM  . levothyroxine  175 mcg Oral QAC breakfast  . mouth rinse  15 mL Mouth Rinse q12n4p  . potassium chloride  10 mEq Oral BID   Continuous Infusions:  Procedures/Studies: Ct Head Wo Contrast  Result Date: 08/28/2016 CLINICAL DATA:  Altered mental status EXAM: CT HEAD WITHOUT CONTRAST TECHNIQUE: Contiguous axial images were obtained from the base of the skull through the vertex without intravenous contrast. COMPARISON:  10/04/2015 FINDINGS: Brain: No intracranial hemorrhage, mass effect or midline shift. Mild cerebral atrophy. Mild periventricular chronic white matter disease. No acute cortical infarction. No mass lesion is noted on this unenhanced scan. Vascular: Atherosclerotic calcifications of carotid siphon are noted. Skull: No skull fracture. Sinuses/Orbits: No acute findings Other: None IMPRESSION: No acute intracranial abnormality. Mild cerebral atrophy. Mild periventricular probable chronic white matter disease. No acute cortical infarction. Electronically Signed   By: Lahoma Crocker M.D.   On: 08/28/2016 16:50   Ct Head W & Wo Contrast  Result Date: 09/01/2016 CLINICAL DATA:  Altered mental status. History of metastatic breast cancer. EXAM: CT HEAD WITHOUT AND WITH CONTRAST TECHNIQUE: Contiguous axial images were obtained from the base of the skull through the vertex without and with intravenous contrast CONTRAST:  28mL ISOVUE-300 IOPAMIDOL (ISOVUE-300) INJECTION 61% COMPARISON:  08/29/2016 brain MRI FINDINGS: Brain: No evidence of acute infarction, hemorrhage, hydrocephalus, extra-axial collection or mass lesion/mass effect.  Mild for age generalized volume loss and white matter disease from chronic microvascular ischemia. Vascular: No hyperdense vessel or unexpected calcification. Visible vessels are patent. Broad basilar tip which contacts the third ventricular floor; on thinner section imaging in an earlier contrast phase on maxillofacial CT 08/28/2016 there is no aneurysm. Skull: No acute or aggressive finding. Sinuses/Orbits: Negative IMPRESSION: No acute finding.  No evidence of metastatic disease. Electronically Signed  By: Monte Fantasia M.D.   On: 09/01/2016 11:35   Ct Soft Tissue Neck W Contrast  Result Date: 08/28/2016 CLINICAL DATA:  Bleeding in mouth and throat. Severe halitosis. History of diabetes, metastatic breast cancer. EXAM: CT NECK WITH CONTRAST TECHNIQUE: Multidetector CT imaging of the neck was performed using the standard protocol following the bolus administration of intravenous contrast. CONTRAST:  69mL ISOVUE-300 IOPAMIDOL (ISOVUE-300) INJECTION 61% COMPARISON:  CT neck Dec 26, 2015 FINDINGS: Pharynx and larynx: Normal. Salivary glands: Atrophic and nonacute. Thyroid: Status post thyroidectomy. Lymph nodes: No lymphadenopathy by CT size criteria. Vascular: Mild calcific atherosclerosis of the carotid bifurcations. Limited intracranial: Normal. Visualized orbits: Normal. Mastoids and visualized paranasal sinuses: Small bilateral maxillary mucosal retention cysts without paranasal sinus air-fluid levels. Mastoid air cells are well aerated. Skeleton: Large bony fragment RIGHT mandible body superior aspect with erosive margins. Interval loss of multiple RIGHT maxillary teeth. Severe C5-6 and moderate C6-7 degenerative discs. Multilevel severe facet arthropathy. No destructive bony lesions. Dental malocclusion Upper chest: LEFT chest Port-A-Cath. Lung apices are clear. No superior mediastinal lymphadenopathy. Other: None. IMPRESSION: Interval loss of multiple RIGHT mandible teeth. RIGHT mandible probable  osteomyelitis with large bony sequestrum, less likely postprocedural fracture nonunion. Electronically Signed   By: Elon Alas M.D.   On: 08/28/2016 19:03   Mr Brain Wo Contrast  Result Date: 08/29/2016 CLINICAL DATA:  History of metastatic breast cancer. Acute presentation with altered mental status. Gait disturbance. Lethargy. EXAM: MRI HEAD WITHOUT CONTRAST TECHNIQUE: Multiplanar, multiecho pulse sequences of the brain and surrounding structures were obtained without intravenous contrast. COMPARISON:  Head CT 08/28/2016 FINDINGS: Brain: Diffusion imaging does not show any acute or subacute infarction or other cause of restricted diffusion. There is generalized brain atrophy with mild small vessel changes of the deep white matter. No cortical or large vessel territory infarction. No evidence of mass lesion, hemorrhage, hydrocephalus or extra-axial collection. No pituitary mass. Vascular: Major vessels at the base of the brain show flow. Skull and upper cervical spine: Negative Sinuses/Orbits: Clear/normal Other: None significant IMPRESSION: No evidence of metastatic disease or other acute process. Age related atrophy with mild small vessel change of the deep white matter. Electronically Signed   By: Nelson Chimes M.D.   On: 08/29/2016 13:04   Mr Brain W Contrast  Result Date: 09/05/2016 CLINICAL DATA:  Initial evaluation for encephalopathy. History of metastatic breast cancer. Evaluate for possible carcinomatosis. EXAM: MRI HEAD WITH CONTRAST TECHNIQUE: Multiplanar, multiecho pulse sequences of the brain and surrounding structures were obtained with intravenous contrast. CONTRAST:  33mL MULTIHANCE GADOBENATE DIMEGLUMINE 529 MG/ML IV SOLN COMPARISON:  Comparison made with prior MRI from 08/20/2016. FINDINGS: Brain: Stable age-related cerebral atrophy with mild chronic microvascular ischemic disease. Ventricles normal in size without hydrocephalus. No extra-axial fluid collection. No midline shift. No  secondary signs for acute infarct. Following contrast infusion, no abnormal enhancement identified. No findings to suggest intraparenchymal metastasis. No evidence for carcinomatosis or other leptomeningeal or dural based metastatic disease. Vascular: Major intracranial vascular flow voids grossly patent. Skull and upper cervical spine: Craniocervical junction within normal limits. Upper cervical spine unremarkable. Bone marrow signal intensity within normal limits. No scalp soft tissue abnormality. Sinuses/Orbits: Globes and orbital soft tissues grossly unremarkable. Mild mucosal thickening within the maxillary sinuses. Paranasal sinuses are otherwise largely clear. No obvious mastoid effusion. IMPRESSION: 1. No MRI evidence for intracranial carcinomatosis or other metastatic disease. 2. No new acute intracranial process identified on this limited exam. 3. Age-related cerebral atrophy with mild chronic  small vessel ischemic disease. Electronically Signed   By: Jeannine Boga M.D.   On: 09/05/2016 02:39   Mr Liver Wo Contrast  Result Date: 08/20/2016 CLINICAL DATA:  Breast cancer with bone and liver metastases. EXAM: MRI ABDOMEN WITHOUT CONTRAST TECHNIQUE: Multiplanar multisequence MR imaging was performed without the administration of intravenous contrast. COMPARISON:  06/04/2011 FINDINGS: Lower chest: No pleural fluid. Postoperative findings in right breast including diffuse edema and small fluid collection appear similar to previous exam. Hepatobiliary: Multifocal liver metastases are again identified. Index lesion within posterior right lobe of liver measures 3.5 x 5.0 cm, image 21 of series 4. On the previous exam this measured 4.9 x 6.0 cm. The index lesion within the lateral segment of left lobe of liver measures 5.0 x 3.3 cm, image 15 of series 4. This is compared with 5.0 x 3.3 cm previously. Index lesion within left lobe of liver measures 1.5 x 1.4 cm, image 18 of series 4. Previously 1.7 x 1.3  cm. Pancreas: No mass, inflammatory changes, or other parenchymal abnormality identified. Spleen:  Within normal limits in size and appearance. Adrenals/Urinary Tract: No masses identified. No evidence of hydronephrosis. Stomach/Bowel: Visualized portions within the abdomen are unremarkable. Vascular/Lymphatic: No pathologically enlarged lymph nodes identified. No abdominal aortic aneurysm demonstrated. Other:  No free fluid or fluid collections identified Musculoskeletal: Mild lumbar spondylosis and degenerative disc disease. IMPRESSION: 1. Taking into account respiratory motion artifact on previous exam, the overall burden of hepatic metastasis is stable. 2. Lumbar spondylosis and degenerative disc disease. Electronically Signed   By: Kerby Moors M.D.   On: 08/20/2016 14:29   Author:  Berle Mull, MD [facility] Pager: [phone] 09/05/2016 4:57 PM     If 7PM-7AM, please contact night-coverage [email or web address] Password [email or web address] 09/05/2016, 4:52 PM   LOS: 7 days

## 2016-09-05 NOTE — Progress Notes (Signed)
ANTICOAGULATION CONSULT NOTE - Initial Consult  Pharmacy Consult for enoxaparin  Indication: DVT  Allergies  Allergen Reactions  . Bydureon [Exenatide] Rash  . Gadolinium Derivatives Other (See Comments)    Pt began having chest numbness/tingling , stated her heart felt funny, had to take her to ED for EKG per RN   CAP  . Enalapril Rash    Patient Measurements: Height: 5' (152.4 cm) Weight: 114 lb 3.2 oz (51.8 kg) IBW/kg (Calculated) : 45.5 Heparin Dosing Weight: n/a   Vital Signs: Temp: 98.2 F (36.8 C) (01/27 0607) Temp Source: Oral (01/27 0607) BP: 123/77 (01/27 0607) Pulse Rate: 56 (01/27 0607)  Labs:  Recent Labs  09/03/16 0515 09/03/16 1530 09/04/16 0415  HGB 10.9*  --  11.1*  HCT 33.0*  --  34.0*  PLT 109*  --  121*  CREATININE 0.51 0.51 0.52    Estimated Creatinine Clearance: 43.6 mL/min (by C-G formula based on SCr of 0.52 mg/dL).   Medical History: Past Medical History:  Diagnosis Date  . Arthritis   . Cancer ([facility])    right breast  . Club foot    chronic limp  . Diabetes mellitus    metformin  . GERD (gastroesophageal reflux disease)    watches diet  . History of radiation therapy 02/22/13- 03/13/13   left proximal humerus 3500 cGy 14 sessions  . Hyperlipidemia   . Hypertension   . Lymphedema    right arm  . Metastasis from malignant tumor of breast ([facility])    left humerous  . Metastasis from malignant tumor of breast ([facility])    liver  . Neuromuscular disorder ([facility])    lt arm numb sometimes  . Osteoporosis   . Thyroid disease   . Wears glasses     Medications:  Prescriptions Prior to Admission  Medication Sig Dispense Refill Last Dose  . aspirin EC 81 MG tablet Take 1 tablet (81 mg total) by mouth daily.   Past Week at Unknown time  . atorvastatin (LIPITOR) 80 MG tablet Take 1 tablet (80 mg total) by mouth daily. 30 tablet 11 Past Week at Unknown time  . docusate sodium (COLACE) 100 MG capsule Take 1 capsule (100 mg total) by mouth 2  (two) times daily as needed. Reported on 10/17/2015 (Patient taking differently: Take 100 mg by mouth 2 (two) times daily as needed for mild constipation. Reported on 10/17/2015) 10 capsule 0 Past Week at Unknown time  . doxepin (SINEQUAN) 25 MG capsule Take 2 capsules (50 mg total) by mouth at bedtime as needed. (Patient taking differently: Take 50 mg by mouth at bedtime as needed (sleep). ) 180 capsule 1 Past Week at Unknown time  . furosemide (LASIX) 40 MG tablet Take 1 tablet (40 mg total) by mouth daily. 90 tablet 2 Past Week at Unknown time  . levothyroxine (SYNTHROID) 175 MCG tablet Take 1 tablet (175 mcg total) by mouth daily before breakfast. 90 tablet 1 Past Week at Unknown time  . lubiprostone (AMITIZA) 24 MCG capsule Take 1 capsule (24 mcg total) by mouth 2 (two) times daily with a meal. (Patient taking differently: Take 24 mcg by mouth 2 (two) times daily as needed for constipation. ) 180 capsule 1 Past Week at Unknown time  . metFORMIN (GLUCOPHAGE) 500 MG tablet Take 1 tablet (500 mg total) by mouth daily. 90 tablet 0 Past Week at Unknown time  . oxyCODONE (OXY IR/ROXICODONE) 5 MG immediate release tablet Take 1-2 tablets (5-10 mg total) by mouth  every 6 (six) hours as needed for severe pain. 100 tablet 0 Past Week at Unknown time  . polyethylene glycol powder (GLYCOLAX/MIRALAX) powder Take 255 g (1 Container total) by mouth daily. (Patient taking differently: Take 1 Container by mouth daily as needed for mild constipation or moderate constipation. ) 255 g 3 Past Week at Unknown time  . potassium chloride (MICRO-K) 10 MEQ CR capsule Take 1 capsule (10 mEq total) by mouth 2 (two) times daily. 60 capsule 1 Past Week at Unknown time  . pregabalin (LYRICA) 75 MG capsule Take 1 capsule (75 mg total) by mouth 2 (two) times daily. Not taking 60 capsule 5 Past Week at Unknown time  . magic mouthwash SOLN Take 5 mLs by mouth 4 (four) times daily as needed for mouth pain. (Patient not taking: Reported on  08/28/2016) 240 mL 3 Completed Course at Unknown time    Assessment: 41 YOF with metastatic breast cancer now with new LLE DVT on venous duplex. Started on apixaban but may need LP for encephalopathy. Switching to Lovenox. Last dose of apixaban was this AM   H/H low stable, Plt 121K   Goal of Therapy:  Anti-Xa level 0.6-1 units/ml 4hrs after LMWH dose given Monitor platelets by anticoagulation protocol: Yes   Plan:  Hold apixaban Lovenox 80 mg SQ Q 24 hours at 2200 tonight  Monitor CBC and s/s of bleeding   Nehal Witting, Darnell Level 09/05/2016,1:01 PM

## 2016-09-06 LAB — CBC
HEMATOCRIT: 36.1 % (ref 36.0–46.0)
HEMOGLOBIN: 12.1 g/dL (ref 12.0–15.0)
MCH: 29.7 pg (ref 26.0–34.0)
MCHC: 33.5 g/dL (ref 30.0–36.0)
MCV: 88.5 fL (ref 78.0–100.0)
PLATELETS: 164 10*3/uL (ref 150–400)
RBC: 4.08 MIL/uL (ref 3.87–5.11)
RDW: 18.4 % — ABNORMAL HIGH (ref 11.5–15.5)
WBC: 5.9 10*3/uL (ref 4.0–10.5)

## 2016-09-06 LAB — BASIC METABOLIC PANEL
Anion gap: 6 (ref 5–15)
BUN: 17 mg/dL (ref 6–20)
CO2: 21 mmol/L — AB (ref 22–32)
CREATININE: 0.49 mg/dL (ref 0.44–1.00)
Calcium: 8.5 mg/dL — ABNORMAL LOW (ref 8.9–10.3)
Chloride: 113 mmol/L — ABNORMAL HIGH (ref 101–111)
GFR calc non Af Amer: >60 mL/min (ref >60)
Glucose, Bld: 119 mg/dL — ABNORMAL HIGH (ref 65–99)
POTASSIUM: 3.2 mmol/L — AB (ref 3.5–5.1)
Sodium: 140 mmol/L (ref 135–145)

## 2016-09-06 LAB — MAGNESIUM: Magnesium: 1.8 mg/dL (ref 1.7–2.4)

## 2016-09-06 MED ORDER — POTASSIUM CHLORIDE 20 MEQ/15ML (10%) PO SOLN
20.0000 meq | Freq: Two times a day (BID) | ORAL | Status: DC
  Administered 2016-09-06 – 2016-09-09 (×8): 20 meq via ORAL
  Filled 2016-09-06 (×8): qty 15

## 2016-09-06 MED ORDER — FOLIC ACID 5 MG/ML IJ SOLN
1.0000 mg | Freq: Once | INTRAMUSCULAR | Status: AC
  Administered 2016-09-06: 1 mg via INTRAVENOUS
  Filled 2016-09-06: qty 0.2

## 2016-09-06 MED ORDER — ENOXAPARIN SODIUM 150 MG/ML ~~LOC~~ SOLN: 80.0000 mg | SUBCUTANEOUS | 0 refills | Status: DC

## 2016-09-06 MED ORDER — ENSURE ENLIVE PO LIQD: 237.0000 mL | Freq: Three times a day (TID) | ORAL | 12 refills | Status: AC

## 2016-09-06 MED ORDER — ACETAMINOPHEN 325 MG PO TABS: 650.0000 mg | ORAL_TABLET | Freq: Four times a day (QID) | ORAL | 0 refills | Status: AC | PRN

## 2016-09-06 MED ORDER — FOLIC ACID 1 MG PO TABS
1.0000 mg | ORAL_TABLET | Freq: Every day | ORAL | Status: DC
  Administered 2016-09-07 – 2016-09-15 (×9): 1 mg via ORAL
  Filled 2016-09-06 (×9): qty 1

## 2016-09-06 NOTE — Progress Notes (Signed)
2 RT's  Tried to get the Pt's ABG. Both RT's we not successful.

## 2016-09-06 NOTE — Progress Notes (Signed)
PROGRESS NOTE  Christy Harrell [id number] DOB: 1941/03/18 DOA: 08/28/2016   PCP: [name], MD  Subjective: This morning, no acute complaints, more awake and oriented and following all the commands.  Brief History:  76 year old female with a history of metastatic breast cancer, hypertension, diabetes mellitus, hyperlipidemia presenting with altered mental status. The patient is poor historian.  Much of the history is provided through the patient's daughter who lives in [place]. Apparently, the patient's daughter noted that the patient was sleepy and repeating herself on the phone on the evening of 08/26/2016. On the morning of 08/27/2016, the patient's cousin was unable to reach the patient, and so the neighbor to check up on the patient. The patient was found sitting on the commode and unable to get up and walk. EMS was activated, and the patient was transferred to sit in a chair and that done. She was noted to be hypotensive at that time and given 500 mL bolus of fluid. The patient refused transfer to the hospital. On the morning of 08/28/2016, the patient's cousin went to check up on the patient again, and the patient was noted not to have moved from the chair in the den from the previous day. Apparently, the patient was confused and noted to be incontinent of urine and stool. EMS was activated, and the patient was brought to the emergency room for further evaluation. The patient's daughter states that the patient has had a gradual functional decline since August 2017. In emergency department, the patient was noted to be hypokalemic. Further workup revealed WBC 11.6. Urinalysis was negative for pyuria. Ammonia was 26.  Pt was transferred from [facility] to [facility] on 1/21 for further neurological work up. EEG was done and showed no evidence of epileptiform activity.   Assessment/Plan: Acute metabolic encephalopathy - Dehydration and volume depletion contributing to her  encephalopathy, ? Paraneoplastic syndrome  - TSH 2.106 - Ammonia 26 - serum B12--1645 - RPR--neg - MRI of the brain without contrast and CT head both neg for metastasis. - Urinalysis negative for pyuria - neurology team consulted, EEG done but not revealing etiology of the encephalopathy  - neuro does not feel LP needed at this time - Dr. Doyle Askew discussed pt's clinical status with her oncologist Dr. [name] and he has recommended MRI brian with contrast to rule out carcinomatosis, if MRI reveals any pathology, LP may not be warranted. -No sedative medications, no evidence of infection or meningeal signs. At present patient is showing improvement, not sure whether LP is warranted or not but I will transition patient from Apixaban to Lovenox to get ready for LP 5 days down the road. Patient will follow up with oncology as an outpatient prior to that.  Check ABG  Failure to thrive, severe PCM  - consulted nutrition, appreciate assistance  - Poor oral intake (virtually she is not eating, per staff she ate half a cup of applesauce)  Dehydration - The patient presented with hemoconcentration and elevated BUN above her normal baseline - BP more stable, pt more alert this AM but still rather weak and tired   Metastatic breast cancer - Initially diagnosed in March 2012 - Metastasis noted initially May 2014--metastasis to bone and liver - Has had history of pathologic fracture to the humerus - has been receiving eribulin, last does 07/23/2017 - appreciate Dr. [name] following   Osteonecrosis of the jaw - per Dr. Karmen Bongo, Bone was biopsied and sent to  [facility] with result read as osteonecrosis of the jaw, no cancer or evidence of infection noted - 08/28/2016 CT neck right mandible with large bony sequestrum - started Amox/Clav that was ordered by her oral surgeon but pt did not fill Rx, completed treatment here in the hospital  Hypokalemia Monitor  Thrombocytopenia, pancytopenia    - possibly due to acute DVT and ? Eliquis  - currently no signs of bleeding  - Serum B12--1645 - TSH 2.106  Acute DVT Left Lower Extremity - venous duplex--+DVT left common iliac and left external iliac - started apixaban - if LP warranted in near future, will need to hold Apixaban for ~5 days therefore switch him to Lovenox in the hospital and will continue at SNF.  DM2 - A1C 4.6 - allow liberal control - no need for SSI  Hyperlipidemia - continue statin  Dysphagia - evaluated by speech-->dys 2 with thin liquids  Diarrhea  Checking C diff  Disposition Plan:    09/07/2016 Family Communication:  No family at bedside this AM. Discussed with daughter over the phone.  Consultants:  Oncology, Neurology Code Status:  DNR  DVT Prophylaxis:  apixaban    Objective: Vitals:   09/05/16 1442 09/05/16 2210 09/06/16 0606 09/06/16 1303  BP: 132/80 100/61 101/67 102/83  Pulse: 96 93 (!) 42 95  Resp: 18 18 18 20   Temp: 97.9 F (36.6 C) 97.3 F (36.3 C) 97.5 F (36.4 C) 98.8 F (37.1 C)  TempSrc: Oral Oral Oral   SpO2: 99% 100% 93% 100%  Weight:      Height:        Intake/Output Summary (Last 24 hours) at 09/06/16 1419 Last data filed at 09/06/16 0559  Gross per 24 hour  Intake              110 ml  Output              150 ml  Net              -40 ml   Weight change:  Exam:   General:  Pt is alert, Oriented 2 but able to follow simple commands   HEENT: No icterus, No thrush, No neck mass, [place]/AT  Cardiovascular: RRR, S1/S2, no rubs, no gallops  Respiratory: CTA bilaterally, no wheezing, no crackles, no rhonchi  Abdomen: Soft/+BS, non tender, non distended, no guarding  Extremities: 1 + LE edema, No lymphangitis, No petechiae, No rashes, no synovitis  Data Reviewed: I have personally reviewed following labs and imaging studies  Basic Metabolic Panel:  Recent Labs Lab 09/01/16 1435 09/02/16 0445 09/03/16 0515 09/03/16 1530 09/04/16 0415  09/06/16 0401  NA  --  141 140 138 139 140  K  --  3.7 3.8 3.5 3.4* 3.2*  CL  --  113* 115* 112* 113* 113*  CO2  --  20* 21* 22 22 21*  GLUCOSE  --  103* 105* 116* 122* 119*  BUN  --  12 8 11 12 17   CREATININE  --  0.46 0.51 0.51 0.52 0.49  CALCIUM  --  7.9* 7.8* 7.9* 8.4* 8.5*  MG 1.8  --   --   --   --  1.8   Liver Function Tests: No results for input(s): AST, ALT, ALKPHOS, BILITOT, PROT, ALBUMIN in the last 168 hours. No results for input(s): AMMONIA in the last 168 hours. Coagulation Profile: No results for input(s): INR, PROTIME in the last 168 hours. CBC:  Recent Labs Lab 09/01/16 0356 09/02/16  0445 09/03/16 0515 09/04/16 0415 09/06/16 0401  WBC 3.6* 3.7* 4.3 5.7 5.9  HGB 10.6* 11.3* 10.9* 11.1* 12.1  HCT 32.0* 34.6* 33.0* 34.0* 36.1  MCV 90.4 90.3 89.2 89.7 88.5  PLT 86* 98* 109* 121* 164   CBG:  Recent Labs Lab 09/05/16 2203  GLUCAP 125*   Urine analysis:    Component Value Date/Time   COLORURINE AMBER (A) 08/28/2016 2352   APPEARANCEUR CLEAR 08/28/2016 2352   LABSPEC >1.046 (H) 08/28/2016 2352   PHURINE 6.0 08/28/2016 2352   GLUCOSEU NEGATIVE 08/28/2016 2352   GLUCOSEU NEGATIVE 10/19/2014 1246   HGBUR SMALL (A) 08/28/2016 2352   BILIRUBINUR NEGATIVE 08/28/2016 2352   KETONESUR 20 (A) 08/28/2016 2352   PROTEINUR 30 (A) 08/28/2016 2352   UROBILINOGEN 1.0 10/19/2014 1246   NITRITE NEGATIVE 08/28/2016 2352   LEUKOCYTESUR NEGATIVE 08/28/2016 2352   Recent Results (from the past 240 hour(s))  Urine culture     Status: None   Collection Time: 08/29/16 12:41 AM  Result Value Ref Range Status   Specimen Description URINE, RANDOM  Final   Special Requests NONE  Final   Culture   Final    NO GROWTH Performed at [facility], [address], [place], [place] [address]    Report Status 08/30/2016 FINAL  Final  Culture, Urine     Status: None   Collection Time: 09/03/16  7:09 AM  Result Value Ref Range Status   Specimen Description URINE,  CATHETERIZED  Final   Special Requests NONE  Final   Culture NO GROWTH  Final   Report Status 09/04/2016 FINAL  Final    Scheduled Meds: . chlorhexidine  15 mL Mouth Rinse BID  . enoxaparin (LOVENOX) injection  1.5 mg/kg Subcutaneous Q24H  . feeding supplement (ENSURE ENLIVE)  237 mL Oral TID BM  . levothyroxine  175 mcg Oral QAC breakfast  . mouth rinse  15 mL Mouth Rinse q12n4p  . potassium chloride  20 mEq Oral BID   Continuous Infusions:  Procedures/Studies: Ct Head Wo Contrast  Result Date: 08/28/2016 CLINICAL DATA:  Altered mental status EXAM: CT HEAD WITHOUT CONTRAST TECHNIQUE: Contiguous axial images were obtained from the base of the skull through the vertex without intravenous contrast. COMPARISON:  10/04/2015 FINDINGS: Brain: No intracranial hemorrhage, mass effect or midline shift. Mild cerebral atrophy. Mild periventricular chronic white matter disease. No acute cortical infarction. No mass lesion is noted on this unenhanced scan. Vascular: Atherosclerotic calcifications of carotid siphon are noted. Skull: No skull fracture. Sinuses/Orbits: No acute findings Other: None IMPRESSION: No acute intracranial abnormality. Mild cerebral atrophy. Mild periventricular probable chronic white matter disease. No acute cortical infarction. Electronically Signed   By: Lahoma Crocker M.D.   On: 08/28/2016 16:50   Ct Head W & Wo Contrast  Result Date: 09/01/2016 CLINICAL DATA:  Altered mental status. History of metastatic breast cancer. EXAM: CT HEAD WITHOUT AND WITH CONTRAST TECHNIQUE: Contiguous axial images were obtained from the base of the skull through the vertex without and with intravenous contrast CONTRAST:  61mL ISOVUE-300 IOPAMIDOL (ISOVUE-300) INJECTION 61% COMPARISON:  08/29/2016 brain MRI FINDINGS: Brain: No evidence of acute infarction, hemorrhage, hydrocephalus, extra-axial collection or mass lesion/mass effect. Mild for age generalized volume loss and white matter disease from chronic  microvascular ischemia. Vascular: No hyperdense vessel or unexpected calcification. Visible vessels are patent. Broad basilar tip which contacts the third ventricular floor; on thinner section imaging in an earlier contrast phase on maxillofacial CT 08/28/2016 there is  no aneurysm. Skull: No acute or aggressive finding. Sinuses/Orbits: Negative IMPRESSION: No acute finding.  No evidence of metastatic disease. Electronically Signed   By: Monte Fantasia M.D.   On: 09/01/2016 11:35   Ct Soft Tissue Neck W Contrast  Result Date: 08/28/2016 CLINICAL DATA:  Bleeding in mouth and throat. Severe halitosis. History of diabetes, metastatic breast cancer. EXAM: CT NECK WITH CONTRAST TECHNIQUE: Multidetector CT imaging of the neck was performed using the standard protocol following the bolus administration of intravenous contrast. CONTRAST:  55mL ISOVUE-300 IOPAMIDOL (ISOVUE-300) INJECTION 61% COMPARISON:  CT neck Dec 26, 2015 FINDINGS: Pharynx and larynx: Normal. Salivary glands: Atrophic and nonacute. Thyroid: Status post thyroidectomy. Lymph nodes: No lymphadenopathy by CT size criteria. Vascular: Mild calcific atherosclerosis of the carotid bifurcations. Limited intracranial: Normal. Visualized orbits: Normal. Mastoids and visualized paranasal sinuses: Small bilateral maxillary mucosal retention cysts without paranasal sinus air-fluid levels. Mastoid air cells are well aerated. Skeleton: Large bony fragment RIGHT mandible body superior aspect with erosive margins. Interval loss of multiple RIGHT maxillary teeth. Severe C5-6 and moderate C6-7 degenerative discs. Multilevel severe facet arthropathy. No destructive bony lesions. Dental malocclusion Upper chest: LEFT chest Port-A-Cath. Lung apices are clear. No superior mediastinal lymphadenopathy. Other: None. IMPRESSION: Interval loss of multiple RIGHT mandible teeth. RIGHT mandible probable osteomyelitis with large bony sequestrum, less likely postprocedural fracture  nonunion. Electronically Signed   By: Elon Alas M.D.   On: 08/28/2016 19:03   Mr Brain Wo Contrast  Result Date: 08/29/2016 CLINICAL DATA:  History of metastatic breast cancer. Acute presentation with altered mental status. Gait disturbance. Lethargy. EXAM: MRI HEAD WITHOUT CONTRAST TECHNIQUE: Multiplanar, multiecho pulse sequences of the brain and surrounding structures were obtained without intravenous contrast. COMPARISON:  Head CT 08/28/2016 FINDINGS: Brain: Diffusion imaging does not show any acute or subacute infarction or other cause of restricted diffusion. There is generalized brain atrophy with mild small vessel changes of the deep white matter. No cortical or large vessel territory infarction. No evidence of mass lesion, hemorrhage, hydrocephalus or extra-axial collection. No pituitary mass. Vascular: Major vessels at the base of the brain show flow. Skull and upper cervical spine: Negative Sinuses/Orbits: Clear/normal Other: None significant IMPRESSION: No evidence of metastatic disease or other acute process. Age related atrophy with mild small vessel change of the deep white matter. Electronically Signed   By: Nelson Chimes M.D.   On: 08/29/2016 13:04   Mr Brain W Contrast  Result Date: 09/05/2016 CLINICAL DATA:  Initial evaluation for encephalopathy. History of metastatic breast cancer. Evaluate for possible carcinomatosis. EXAM: MRI HEAD WITH CONTRAST TECHNIQUE: Multiplanar, multiecho pulse sequences of the brain and surrounding structures were obtained with intravenous contrast. CONTRAST:  55mL MULTIHANCE GADOBENATE DIMEGLUMINE 529 MG/ML IV SOLN COMPARISON:  Comparison made with prior MRI from 08/20/2016. FINDINGS: Brain: Stable age-related cerebral atrophy with mild chronic microvascular ischemic disease. Ventricles normal in size without hydrocephalus. No extra-axial fluid collection. No midline shift. No secondary signs for acute infarct. Following contrast infusion, no abnormal  enhancement identified. No findings to suggest intraparenchymal metastasis. No evidence for carcinomatosis or other leptomeningeal or dural based metastatic disease. Vascular: Major intracranial vascular flow voids grossly patent. Skull and upper cervical spine: Craniocervical junction within normal limits. Upper cervical spine unremarkable. Bone marrow signal intensity within normal limits. No scalp soft tissue abnormality. Sinuses/Orbits: Globes and orbital soft tissues grossly unremarkable. Mild mucosal thickening within the maxillary sinuses. Paranasal sinuses are otherwise largely clear. No obvious mastoid effusion. IMPRESSION: 1. No MRI evidence for  intracranial carcinomatosis or other metastatic disease. 2. No new acute intracranial process identified on this limited exam. 3. Age-related cerebral atrophy with mild chronic small vessel ischemic disease. Electronically Signed   By: Jeannine Boga M.D.   On: 09/05/2016 02:39   Mr Liver Wo Contrast  Result Date: 08/20/2016 CLINICAL DATA:  Breast cancer with bone and liver metastases. EXAM: MRI ABDOMEN WITHOUT CONTRAST TECHNIQUE: Multiplanar multisequence MR imaging was performed without the administration of intravenous contrast. COMPARISON:  06/04/2011 FINDINGS: Lower chest: No pleural fluid. Postoperative findings in right breast including diffuse edema and small fluid collection appear similar to previous exam. Hepatobiliary: Multifocal liver metastases are again identified. Index lesion within posterior right lobe of liver measures 3.5 x 5.0 cm, image 21 of series 4. On the previous exam this measured 4.9 x 6.0 cm. The index lesion within the lateral segment of left lobe of liver measures 5.0 x 3.3 cm, image 15 of series 4. This is compared with 5.0 x 3.3 cm previously. Index lesion within left lobe of liver measures 1.5 x 1.4 cm, image 18 of series 4. Previously 1.7 x 1.3 cm. Pancreas: No mass, inflammatory changes, or other parenchymal abnormality  identified. Spleen:  Within normal limits in size and appearance. Adrenals/Urinary Tract: No masses identified. No evidence of hydronephrosis. Stomach/Bowel: Visualized portions within the abdomen are unremarkable. Vascular/Lymphatic: No pathologically enlarged lymph nodes identified. No abdominal aortic aneurysm demonstrated. Other:  No free fluid or fluid collections identified Musculoskeletal: Mild lumbar spondylosis and degenerative disc disease. IMPRESSION: 1. Taking into account respiratory motion artifact on previous exam, the overall burden of hepatic metastasis is stable. 2. Lumbar spondylosis and degenerative disc disease. Electronically Signed   By: Kerby Moors M.D.   On: 08/20/2016 14:29   Author:  Berle Mull, MD [facility] Pager: [phone] 09/06/2016 2:19 PM     If 7PM-7AM, please contact night-coverage [email or web address] Password [email or web address] 09/06/2016, 2:19 PM   LOS: 8 days

## 2016-09-07 LAB — POCT I-STAT 3, ART BLOOD GAS (G3+)
ACID-BASE EXCESS: 3 mmol/L — AB (ref 0.0–2.0)
Bicarbonate: 22.1 mmol/L (ref 20.0–28.0)
O2 Saturation: 94 %
PO2 ART: 53 mmHg — AB (ref 83.0–108.0)
TCO2: 23 mmol/L (ref 0–100)
pCO2 arterial: 19.4 mmHg — CL (ref 32.0–48.0)
pH, Arterial: 7.665 (ref 7.350–7.450)

## 2016-09-07 LAB — CBC
HCT: 34 % — ABNORMAL LOW (ref 36.0–46.0)
Hemoglobin: 11.3 g/dL — ABNORMAL LOW (ref 12.0–15.0)
MCH: 29.4 pg (ref 26.0–34.0)
MCHC: 33.2 g/dL (ref 30.0–36.0)
MCV: 88.5 fL (ref 78.0–100.0)
PLATELETS: 147 10*3/uL — AB (ref 150–400)
RBC: 3.84 MIL/uL — AB (ref 3.87–5.11)
RDW: 18.3 % — ABNORMAL HIGH (ref 11.5–15.5)
WBC: 5.6 10*3/uL (ref 4.0–10.5)

## 2016-09-07 LAB — BASIC METABOLIC PANEL
Anion gap: 7 (ref 5–15)
BUN: 19 mg/dL (ref 6–20)
CHLORIDE: 110 mmol/L (ref 101–111)
CO2: 24 mmol/L (ref 22–32)
CREATININE: 0.52 mg/dL (ref 0.44–1.00)
Calcium: 8.7 mg/dL — ABNORMAL LOW (ref 8.9–10.3)
GFR calc non Af Amer: >60 mL/min (ref >60)
Glucose, Bld: 113 mg/dL — ABNORMAL HIGH (ref 65–99)
Potassium: 3.1 mmol/L — ABNORMAL LOW (ref 3.5–5.1)
SODIUM: 141 mmol/L (ref 135–145)

## 2016-09-07 LAB — GLUCOSE, CAPILLARY: GLUCOSE-CAPILLARY: 111 mg/dL — AB (ref 65–99)

## 2016-09-07 NOTE — Progress Notes (Signed)
[facility] Progress Note  Patient: Christy Harrell [id number]   PCP: [name], MD DOB: 16-Jun-1941   DOA: 08/28/2016   DOS: 09/07/2016   Date of Service: the patient was seen and examined on 09/07/2016  Brief hospital course: Pt. with PMH of metastatic breast cancer, hypertension, diabetes mellitus, hyperlipidemia presenting with altered mental status. The patient is poor historian. Much of the history is provided through the patient's daughter who lives in [place]. Apparently, the patient's daughter noted that the patient was sleepy and repeating herself on the phone on the evening of 08/26/2016. On the morning of 08/27/2016, the patient's cousin was unable to reach the patient, and so the neighbor to check up on the patient. The patient was found sitting on the commode and unable to get up and walk. EMS was called. She was noted to be hypotensive at that time and given 500 mL bolus of fluid. The patient refused transfer to the hospital. On the morning of 08/28/2016, the patient's cousin went to check up on the patient again, and the patient was noted not to have moved from the chair in the denfrom the previous day. patient has had a gradual functional decline since August 2017. After initial workup being negative, Pt was transferred from [facility] to [facility] on 1/21 for further neurological work up. EEG was done and showed no evidence of epileptiform activity.   Currently further plan is Further workup of acute encephalopathy.  Assessment and Plan: Acute metabolic encephalopathy - Dehydration and volume depletion contributing to her encephalopathy, ? Paraneoplastic syndrome  - TSH 2.106 - Ammonia 26 - serum B12--1645 - RPR--neg - MRI of the brain with contrast and CT head both neg for metastasis. - Urinalysis negative for pyuria - neurology team consulted, EEG done but not revealing etiology of the encephalopathy  - discussed with Dr. [name]. - No sedative medications, no  evidence of infection or meningeal signs. - ABG shows hyperventilation. Mild hypoxia.  - requesting neurology to revisit the pt and give Recommendation.  Failure to thrive, severe PCM  - consulted nutrition, appreciate assistance  - Poor oral intake (virtually she is not eating, per staff she ate half a cup of applesauce)  Dehydration - seems to have resolved. Monitor  Metastatic breast cancer - Initially diagnosed in March 2012 - Metastasis noted initially May 2014--metastasis to bone and liver - Has had history of pathologic fracture to the humerus - has been receiving eribulin, last does 07/23/2017 - appreciate Dr. [name] following   Osteonecrosis of the jaw - per Dr. Karmen Bongo, Bone was biopsied and sent to [facility] with result read as osteonecrosis of the jaw, no cancer or evidence of infection noted - 08/28/2016 CT neck right mandible with large bony sequestrum - started Amox/Clav that was ordered by her oral surgeon but pt did not fill Rx, completed treatment here in the hospital  Hypokalemia Replace and Monitor  Thrombocytopenia, pancytopenia  - possibly due to acute DVT and ? Eliquis  - currently no signs of bleeding  - Serum B12--1645 - TSH 2.106  Acute DVT Left Lower Extremity - venous duplex--+DVT left common iliac and left external iliac - started apixaban, switched to lovenox for need for LP - per pharmacy will need to hold lovenox for 24 hours prior and 4 hour after of LP.  DM2 - A1C 4.6 - allow liberal control - no need for SSI  Hyperlipidemia - continue statin  Dysphagia - evaluated by speech-->dys 2 with thin liquids  Diarrhea  Checking C diff  Bowel regimen: last BM 09/07/2016 Diet: dys 2 diet DVT Prophylaxis: on therapeutic anticoagulation.  Advance goals of care discussion: DNR DNI  Family Communication: no family was present at bedside, at the time of interview.    Disposition:  Discharge to SNF. Expected discharge date:  09/10/2016, pending improvement in mentation  Consultants: oncology, neurology Procedures: none  Antibiotics: Anti-infectives    Start     Dose/Rate Route Frequency Ordered Stop   08/30/16 1245  amoxicillin-clavulanate (AUGMENTIN) 875-125 MG per tablet 1 tablet     1 tablet Oral Every 12 hours 08/30/16 1236 09/04/16 2142     Subjective: unable to remain awake and follow command  Objective: Physical Exam: Vitals:   09/06/16 0606 09/06/16 1303 09/06/16 2123 09/07/16 0546  BP: 101/67 102/83 102/63 120/64  Pulse: (!) 42 95 90 93  Resp: 18 20 18 18   Temp: 97.5 F (36.4 C) 98.8 F (37.1 C) 98.6 F (37 C) 98.2 F (36.8 C)  TempSrc: Oral  Oral Oral  SpO2: 93% 100% 100% 97%  Weight:      Height:        Intake/Output Summary (Last 24 hours) at 09/07/16 1332 Last data filed at 09/07/16 0648  Gross per 24 hour  Intake               60 ml  Output              150 ml  Net              -90 ml   Filed Weights   08/29/16 0041  Weight: 51.8 kg (114 lb 3.2 oz)    General: drowsy and lethargic. Appear in mild distress, affect appropriate Eyes: PERRL, Conjunctiva normal ENT: Oral Mucosa clear moist. Neck: difficult to assess JVD, no Abnormal Mass Or lumps Cardiovascular: S1 and S2 Present, no Murmur, Respiratory: Bilateral Air entry equal and Decreased, no use of accessory muscle, Clear to Auscultation, no Crackles, no wheezes Abdomen: Bowel Sound present, Soft and no tenderness Skin: no redness, no Rash, no induration Extremities: right upper extremity edema, trace Pedal edema, no calf tenderness  Data Reviewed: CBC:  Recent Labs Lab 09/02/16 0445 09/03/16 0515 09/04/16 0415 09/06/16 0401 09/07/16 0405  WBC 3.7* 4.3 5.7 5.9 5.6  HGB 11.3* 10.9* 11.1* 12.1 11.3*  HCT 34.6* 33.0* 34.0* 36.1 34.0*  MCV 90.3 89.2 89.7 88.5 88.5  PLT 98* 109* 121* 164 [id number]*   Basic Metabolic Panel:  Recent Labs Lab 09/01/16 1435  09/03/16 0515 09/03/16 1530 09/04/16 0415  09/06/16 0401 09/07/16 0405  NA  --   < > 140 138 139 140 141  K  --   < > 3.8 3.5 3.4* 3.2* 3.1*  CL  --   < > 115* 112* 113* 113* 110  CO2  --   < > 21* 22 22 21* 24  GLUCOSE  --   < > 105* 116* 122* 119* 113*  BUN  --   < > 8 11 12 17 19   CREATININE  --   < > 0.51 0.51 0.52 0.49 0.52  CALCIUM  --   < > 7.8* 7.9* 8.4* 8.5* 8.7*  MG 1.8  --   --   --   --  1.8  --   < > = values in this interval not displayed.  CBG:  Recent Labs Lab 09/05/16 2203 09/07/16 0851  GLUCAP 125* 111*   Studies: No results found.  Scheduled Meds: . chlorhexidine  15 mL Mouth Rinse BID  . enoxaparin (LOVENOX) injection  1.5 mg/kg Subcutaneous Q24H  . feeding supplement (ENSURE ENLIVE)  237 mL Oral TID BM  . folic acid  1 mg Oral Daily  . levothyroxine  175 mcg Oral QAC breakfast  . mouth rinse  15 mL Mouth Rinse q12n4p  . potassium chloride  20 mEq Oral BID   Continuous Infusions: PRN Meds: acetaminophen **OR** acetaminophen, docusate sodium, lip balm, lubiprostone, magic mouthwash, ondansetron **OR** ondansetron (ZOFRAN) IV, sodium chloride flush  Time spent: 30 minutes  Author: Berle Mull, MD [facility] Pager: [phone] 09/07/2016 1:32 PM  If 7PM-7AM, please contact night-coverage at [email or web address], password [facility]

## 2016-09-07 NOTE — Progress Notes (Signed)
Speech Language Pathology Treatment: Dysphagia  Patient Details Name: Christy Harrell MRN: [id number] DOB: [birth year] Today's Date: 09/07/2016 Time: [id number] SLP Time Calculation (min) (ACUTE ONLY): 16 min  Assessment / Plan / Recommendation Clinical Impression  Overall, pt appears to be declining since most recent SLP interaction. Lethargic, arouses, responds and accepts po's. Significantly delayed oral manipulation; holding bolus. Decreased laryngeal elevation during palpation. No cough throat clear or wet vocal quality. SLP will downgrade diet texture to puree due to lethargy and clinical observations. Question swallow prognosis. Noted pt is followed by Palliative care. Will follow.   HPI HPI: 76 year old female with a history of metastatic breast cancer, hypertension, diabetes mellitus, hyperlipidemia presenting with altered mental status. Pt also with osteonecrosis of the jaw with recent dental extraction and bone biopsy. CT Neck 1/19 showed large bony sequestrum.      SLP Plan  Continue with current plan of care     Recommendations  Diet recommendations: Dysphagia 1 (puree);Thin liquid Liquids provided via: Straw;Cup Medication Administration: Crushed with puree Supervision: Staff to assist with self feeding;Full supervision/cueing for compensatory strategies Compensations: Minimize environmental distractions;Slow rate;Small sips/bites;Lingual sweep for clearance of pocketing;Follow solids with liquid Postural Changes and/or Swallow Maneuvers: Seated upright 90 degrees                Oral Care Recommendations: Oral care BID Follow up Recommendations: 24 hour supervision/assistance;Skilled Nursing facility Plan: Continue with current plan of care       GO                [name] 09/07/2016, 5:07 PM   [name] Caroli.Ed [employer] [phone]

## 2016-09-07 NOTE — Care Management Note (Signed)
Case Management Note  Patient Details  Name: Krystalin Moncion MRN: [id number] Date of Birth: 01-04-1941  Subjective/Objective:            Admitted with FTT.        Action/Plan: Plan is to discharge to SNF today. CSW managing disposition to SNF.  Expected Discharge Date:  09/06/16               Expected Discharge Plan:  [place]  In-House Referral:  Clinical Social Work  Discharge planning Services  CM Consult   Status of Service:  COMPLETED  If discussed at [employer] of Stay Meetings, dates discussed:    Additional Comments:  Sharin Mons, RN 09/07/2016, 10:41 AM

## 2016-09-07 NOTE — Care Management Important Message (Signed)
Important Message  Patient Details  Name: Christy Harrell MRN: [id number] Date of Birth: 04-Nov-1940   Medicare Important Message Given:  Yes    Sharin Mons, RN 09/07/2016, 1:35 PM

## 2016-09-08 LAB — COMPREHENSIVE METABOLIC PANEL
ALK PHOS: 71 U/L (ref 38–126)
ALT: 18 U/L (ref 14–54)
AST: 22 U/L (ref 15–41)
Albumin: 2.3 g/dL — ABNORMAL LOW (ref 3.5–5.0)
Anion gap: 6 (ref 5–15)
BUN: 18 mg/dL (ref 6–20)
CALCIUM: 8.1 mg/dL — AB (ref 8.9–10.3)
CHLORIDE: 111 mmol/L (ref 101–111)
CO2: 25 mmol/L (ref 22–32)
CREATININE: 0.48 mg/dL (ref 0.44–1.00)
GFR calc Af Amer: >60 mL/min (ref >60)
GFR calc non Af Amer: >60 mL/min (ref >60)
GLUCOSE: 107 mg/dL — AB (ref 65–99)
Potassium: 2.7 mmol/L — CL (ref 3.5–5.1)
SODIUM: 142 mmol/L (ref 135–145)
Total Bilirubin: 1 mg/dL (ref 0.3–1.2)
Total Protein: 4.9 g/dL — ABNORMAL LOW (ref 6.5–8.1)

## 2016-09-08 LAB — CBC WITH DIFFERENTIAL/PLATELET
BASOS ABS: 0 10*3/uL (ref 0.0–0.1)
Basophils Relative: 0 %
Eosinophils Absolute: 0 10*3/uL (ref 0.0–0.7)
Eosinophils Relative: 1 %
HCT: 31.5 % — ABNORMAL LOW (ref 36.0–46.0)
HEMOGLOBIN: 10.5 g/dL — AB (ref 12.0–15.0)
LYMPHS ABS: 1 10*3/uL (ref 0.7–4.0)
Lymphocytes Relative: 21 %
MCH: 29.6 pg (ref 26.0–34.0)
MCHC: 33.3 g/dL (ref 30.0–36.0)
MCV: 88.7 fL (ref 78.0–100.0)
MONO ABS: 0.4 10*3/uL (ref 0.1–1.0)
MONOS PCT: 8 %
NEUTROS PCT: 70 %
Neutro Abs: 3.3 10*3/uL (ref 1.7–7.7)
Platelets: 104 10*3/uL — ABNORMAL LOW (ref 150–400)
RBC: 3.55 MIL/uL — AB (ref 3.87–5.11)
RDW: 18.4 % — ABNORMAL HIGH (ref 11.5–15.5)
WBC: 4.7 10*3/uL (ref 4.0–10.5)

## 2016-09-08 LAB — AMMONIA: AMMONIA: 31 umol/L (ref 9–35)

## 2016-09-08 LAB — MAGNESIUM: Magnesium: 1.7 mg/dL (ref 1.7–2.4)

## 2016-09-08 LAB — PHOSPHORUS: PHOSPHORUS: 2.1 mg/dL — AB (ref 2.5–4.6)

## 2016-09-08 LAB — LACTIC ACID, PLASMA: Lactic Acid, Venous: 0.9 mmol/L (ref 0.5–1.9)

## 2016-09-08 LAB — VITAMIN B12: VITAMIN B 12: 943 pg/mL — AB (ref 180–914)

## 2016-09-08 LAB — PROTIME-INR
INR: 1.25
Prothrombin Time: 15.8 seconds — ABNORMAL HIGH (ref 11.4–15.2)

## 2016-09-08 MED ORDER — POTASSIUM PHOSPHATES 15 MMOLE/5ML IV SOLN
20.0000 meq | Freq: Once | INTRAVENOUS | Status: AC
  Administered 2016-09-08: 20 meq via INTRAVENOUS
  Filled 2016-09-08: qty 4.55

## 2016-09-08 MED ORDER — SODIUM CHLORIDE 0.9 % IV SOLN
30.0000 meq | Freq: Once | INTRAVENOUS | Status: AC
  Administered 2016-09-08: 30 meq via INTRAVENOUS
  Filled 2016-09-08 (×2): qty 15

## 2016-09-08 NOTE — Progress Notes (Signed)
ANTICOAGULATION CONSULT NOTE - Follow Up Consult  Pharmacy Consult for Lovenox Indication: DVT  Allergies  Allergen Reactions  . Bydureon [Exenatide] Rash  . Gadolinium Derivatives Other (See Comments)    Pt began having chest numbness/tingling , stated her heart felt funny, had to take her to ED for EKG per RN   CAP  . Enalapril Rash    Patient Measurements: Height: 5' (152.4 cm) Weight: 114 lb 3.2 oz (51.8 kg) IBW/kg (Calculated) : 45.5  Vital Signs: Temp: 98.5 F (36.9 C) (01/30 0607) Temp Source: Oral (01/30 0607) BP: 117/55 (01/30 0607) Pulse Rate: 75 (01/30 0607)  Labs:  Recent Labs  09/06/16 0401 09/07/16 0405 09/08/16 0418  HGB 12.1 11.3* 10.5*  HCT 36.1 34.0* 31.5*  PLT 164 147* 104*  LABPROT  --   --  15.8*  INR  --   --  1.25  CREATININE 0.49 0.52 0.48    Estimated Creatinine Clearance: 43.6 mL/min (by C-G formula based on SCr of 0.48 mg/dL).   Assessment: 76yo female on Lovenox 1.5mg /kg SQ q24 bridge while off Eliquis.  Per d/w Dr Posey Pronto today, LP is planned for tomorrow.  Lovenox needs held x 24 prior to LP and for 4hr afterward.    Goal of Therapy:  Anti-Xa level 0.6-1 units/ml 4hrs after LMWH dose given Monitor platelets by anticoagulation protocol: Yes   Plan:  Hold Lovenox (dose due at 10PM) F/U resume tomorrow after LP  Gracy Bruins, PharmD [facility]

## 2016-09-08 NOTE — Progress Notes (Signed)
[facility] Progress Note  Patient: Christy Harrell [id number]   PCP: [name], MD DOB: [birth year]   DOA: 08/28/2016   DOS: 09/08/2016   Date of Service: the patient was seen and examined on 09/08/2016  Brief hospital course: Pt. with PMH of metastatic breast cancer, hypertension, diabetes mellitus, hyperlipidemia presenting with altered mental status. The patient is poor historian. Much of the history is provided through the patient's daughter who lives in [place]. Apparently, the patient's daughter noted that the patient was sleepy and repeating herself on the phone on the evening of 08/26/2016. On the morning of 08/27/2016, the patient's cousin was unable to reach the patient, and so the neighbor to check up on the patient. The patient was found sitting on the commode and unable to get up and walk. EMS was called. She was noted to be hypotensive at that time and given 500 mL bolus of fluid. The patient refused transfer to the hospital. On the morning of 08/28/2016, the patient's cousin went to check up on the patient again, and the patient was noted not to have moved from the chair in the denfrom the previous day. patient has had a gradual functional decline since August 2017. After initial workup being negative, Pt was transferred from [facility] to [facility] on 1/21 for further neurological work up. EEG was done and showed no evidence of epileptiform activity.   Currently further plan is Further workup of acute encephalopathy.  Assessment and Plan: Acute metabolic encephalopathy - Dehydration and volume depletion contributing to her encephalopathy, ? Paraneoplastic syndrome  - TSH 2.106 - Ammonia 26 - serum B12--1645 - RPR--neg - MRI of the brain with contrast and CT head both neg for metastasis. - Urinalysis negative for pyuria - neurology team consulted, EEG done but not revealing etiology of the encephalopathy  - discussed with Dr. [name]. - No sedative medications, no  evidence of infection or meningeal signs. - ABG shows hyperventilation. Mild hypoxia.  - Slowly getting better this morning, although still not at her baseline, still would require an LP. - Appreciate neurology input, patient will go for LP tomorrow, we will be holding the Lovenox for tonight and 4 hours after the procedure.  Failure to thrive, severe PCM  - consulted nutrition, appreciate assistance  - Poor oral intake actually none. Getting better with more awake.  Dehydration - seems to have resolved. Monitor  Metastatic breast cancer - Initially diagnosed in March 2012 - Metastasis noted initially May 2014--metastasis to bone and liver - Has had history of pathologic fracture to the humerus - has been receiving eribulin, last does 07/23/2017 - appreciate Dr. [name] following   Osteonecrosis of the jaw - per Dr. Karmen Bongo, Bone was biopsied and sent to [facility] with result read as osteonecrosis of the jaw, no cancer or evidence of infection noted - 08/28/2016 CT neck right mandible with large bony sequestrum - started Amox/Clav that was ordered by her oral surgeon but pt did not fill Rx, completed treatment here in the hospital  Hypokalemia Replace and Monitor  Thrombocytopenia, pancytopenia  - possibly due to acute DVT and ? Eliquis  - currently no signs of bleeding  - Serum B12--1645 - TSH 2.106  Acute DVT Left Lower Extremity - venous duplex--+DVT left common iliac and left external iliac - started apixaban, switched to lovenox for need for LP - per pharmacy will need to hold lovenox for 24 hours prior and 4 hour after of LP.  DM2 - A1C 4.6 -  allow liberal control - no need for SSI  Hyperlipidemia - continue statin  Dysphagia - evaluated by speech-->dys 2 with thin liquids  Diarrhea  Checking C diff, unable to get any samples so far, diarrhea frequency reducing.  Bowel regimen: last BM 09/07/2016 Diet: dys 2 diet DVT Prophylaxis: on  therapeutic anticoagulation.  Advance goals of care discussion: DNR DNI  Family Communication: no family was present at bedside, at the time of interview.    Disposition:  Discharge to SNF. Expected discharge date: 09/10/2016, pending improvement in mentation  Consultants: oncology, neurology Procedures: none  Antibiotics: Anti-infectives    Start     Dose/Rate Route Frequency Ordered Stop   08/30/16 1245  amoxicillin-clavulanate (AUGMENTIN) 875-125 MG per tablet 1 tablet     1 tablet Oral Every 12 hours 08/30/16 1236 09/04/16 2142     Subjective: unable to remain awake and follow command  Objective: Physical Exam: Vitals:   09/07/16 2111 09/08/16 0118 09/08/16 0607 09/08/16 1433  BP: 98/80 119/63 (!) 117/55 (!) 108/48  Pulse: 88 82 75 94  Resp: 18 20 18 18   Temp: 99.1 F (37.3 C) 99.4 F (37.4 C) 98.5 F (36.9 C) 98.6 F (37 C)  TempSrc: Oral Oral Oral   SpO2: 100% 100% 100% 100%  Weight:      Height:        Intake/Output Summary (Last 24 hours) at 09/08/16 1606 Last data filed at 09/08/16 1100  Gross per 24 hour  Intake              480 ml  Output              200 ml  Net              280 ml   Filed Weights   08/29/16 0041  Weight: 51.8 kg (114 lb 3.2 oz)    General: drowsy and lethargic. Appear in mild distress, affect appropriate Eyes: PERRL, Conjunctiva normal ENT: Oral Mucosa clear moist. Neck: difficult to assess JVD, no Abnormal Mass Or lumps Cardiovascular: S1 and S2 Present, no Murmur, Respiratory: Bilateral Air entry equal and Decreased, no use of accessory muscle, Clear to Auscultation, no Crackles, no wheezes Abdomen: Bowel Sound present, Soft and no tenderness Skin: no redness, no Rash, no induration Extremities: right upper extremity edema, trace Pedal edema, no calf tenderness  Data Reviewed: CBC:  Recent Labs Lab 09/03/16 0515 09/04/16 0415 09/06/16 0401 09/07/16 0405 09/08/16 0418  WBC 4.3 5.7 5.9 5.6 4.7  NEUTROABS  --   --    --   --  3.3  HGB 10.9* 11.1* 12.1 11.3* 10.5*  HCT 33.0* 34.0* 36.1 34.0* 31.5*  MCV 89.2 89.7 88.5 88.5 88.7  PLT 109* 121* 164 147* [id number]*   Basic Metabolic Panel:  Recent Labs Lab 09/03/16 1530 09/04/16 0415 09/06/16 0401 09/07/16 0405 09/08/16 0418  NA 138 139 140 141 142  K 3.5 3.4* 3.2* 3.1* 2.7*  CL 112* 113* 113* 110 111  CO2 22 22 21* 24 25  GLUCOSE 116* 122* 119* 113* 107*  BUN 11 12 17 19 18   CREATININE 0.51 0.52 0.49 0.52 0.48  CALCIUM 7.9* 8.4* 8.5* 8.7* 8.1*  MG  --   --  1.8  --  1.7  PHOS  --   --   --   --  2.1*    CBG:  Recent Labs Lab 09/05/16 2203 09/07/16 0851  GLUCAP 125* 111*   Studies: No results found.  Scheduled Meds: . chlorhexidine  15 mL Mouth Rinse BID  . feeding supplement (ENSURE ENLIVE)  237 mL Oral TID BM  . folic acid  1 mg Oral Daily  . levothyroxine  175 mcg Oral QAC breakfast  . mouth rinse  15 mL Mouth Rinse q12n4p  . potassium chloride  20 mEq Oral BID  . potassium phosphate IVPB (mEq)  20 mEq Intravenous Once   Continuous Infusions: PRN Meds: acetaminophen **OR** acetaminophen, docusate sodium, lip balm, lubiprostone, magic mouthwash, ondansetron **OR** ondansetron (ZOFRAN) IV, sodium chloride flush  Time spent: 30 minutes  Author: Berle Mull, MD [facility] Pager: [phone] 09/08/2016 4:06 PM  If 7PM-7AM, please contact night-coverage at [email or web address], password [facility]

## 2016-09-08 NOTE — Progress Notes (Signed)
CRITICAL VALUE ALERT  Critical value received:  K 2.7  Date of notification:  09/08/2016  Time of notification:  [id number]  Critical value read back:Yes.    Nurse who received alert:  Clydell Hakim RN  MD notified (1st page):  NP K. Schorr  Time of first page:  [phone]  MD notified (2nd page):  Time of second page:  Responding MD:  NP K. Schorr  Time MD responded:  [phone]

## 2016-09-08 NOTE — Progress Notes (Signed)
Subjective: She appears to be improved compared to previous days.  Exam: Vitals:   09/08/16 0118 09/08/16 0607  BP: 119/63 (!) 117/55  Pulse: 82 75  Resp: 20 18  Temp: 99.4 F (37.4 C) 98.5 F (36.9 C)   Gen: In bed, NAD Resp: non-labored breathing, no acute distress Abd: soft, nt  Neuro: MS: Somnolent, but does wake up and interact appropriately. She is alert and oriented to person and place. CN: Visual fields full, extracted movements intact Motor: She moves all extremities with relatively preserved strength. Sensory: Intact to light touch  Impression: 76 year old female with waxing/waning lethargy most consistent with delirium of unclear cause. Given that somnolence is to be very biggest issue, I wonder if adding a low-dose stimulant such as Provigil might be beneficial to her.  Recommendations: 1) lumbar puncture, she received tremendous Lovenox yesterday evening and this may be done tomorrow. 2) could consider staring Provigil tomorrow if she does not continue to improve 3) neurology will continue to follow  Roland Rack, MD [facility] [phone]  If 7pm- 7am, please page neurology on call as listed in [place].

## 2016-09-08 NOTE — Progress Notes (Signed)
Nutrition Follow-up  DOCUMENTATION CODES:   Severe malnutrition in context of chronic illness  INTERVENTION:   -Continue Magic Cup TID with meals -Continue Ensure Enlive po TID, each supplement provides 350 kcal and 20 grams of protein  NUTRITION DIAGNOSIS:   Malnutrition related to chronic illness as evidenced by percent weight loss, severe depletion of body fat, severe depletion of muscle mass.  Ongoing  GOAL:   Patient will meet greater than or equal to 90% of their needs  Progressing  MONITOR:   PO intake, Supplement acceptance, Labs, Weight trends, I & O's  REASON FOR ASSESSMENT:   Consult Assessment of nutrition requirement/status  ASSESSMENT:   76 year old female with a history of metastatic breast cancer, hypertension, diabetes mellitus, hyperlipidemia presenting with altered mental status. The patient is poor historian.  Much of the history is provided through the patient's daughter who lives in [place]. Apparently, the patient's daughter noted that the patient was sleepy and repeating herself on the phone on the evening of 08/26/2016. On the morning of 08/27/2016, the patient's cousin was unable to reach the patient, and so the neighbor to check up on the patient. The patient was found sitting on the commode and unable to get up and walk. EMS was activated, and the patient was transferred to sit in a chair and that done. She was noted to be hypotensive at that time and given 500 mL bolus of fluid. The patient refused transfer to the hospital. On the morning of 08/28/2016, the patient's cousin went to check up on the patient again, and the patient was noted not to have moved from the chair in the den from the previous day. Apparently, the patient was confused and noted to be incontinent of urine and stool.  Pt underwent BSE this AM; noted pt was downgraded to puree (dysphagia 1) for ease of intake. Pt remains with minimal intake (PO: 0%).   Spoke with pt at  bedside. Pt was pleasant and conversant, but answered most questions inappropriately (often asked this RD "so how are you doing today?"). Noted vanilla Ensure supplement on tray table, of which she consumed 80%. Pt reports she likes supplements and is agreeable to continue to drink them. Discussed importance of food and supplement intake to promote healing.   Palliative care continues to follow.  Labs reviewed: K: 2.7 (on IV supplementation).   Diet Order:  Diet - low sodium heart healthy DIET - DYS 1 Room service appropriate? Yes; Fluid consistency: Thin  Skin:  Reviewed, no issues  Last BM:  09/08/16  Height:   Ht Readings from Last 1 Encounters:  08/29/16 5' (1.524 m)    Weight:   Wt Readings from Last 1 Encounters:  08/29/16 114 lb 3.2 oz (51.8 kg)    Ideal Body Weight:  45.5 kg  BMI:  Body mass index is 22.3 kg/m.  Estimated Nutritional Needs:   Kcal:  1400-1600  Protein:  65-75g  Fluid:  1.6L/day  EDUCATION NEEDS:   No education needs identified at this time  Joel Mericle A. Jimmye Norman, RD, LDN, CDE Pager: [phone] After hours Pager: [phone]

## 2016-09-08 NOTE — Progress Notes (Signed)
Patient had Vtach run. Patient remained asyptomatic BP 119/63, HR 82. On-call NP K. Schorr notified.

## 2016-09-08 NOTE — Progress Notes (Signed)
Physical Therapy Treatment Patient Details Name: Christy Harrell MRN: [id number] DOB: January 19, 1941 Today's Date: 09/08/2016    History of Present Illness Christy Harrell is a 76 y.o. female with medical history significant of hypothyroidism, metastatic breat cancer undergoing chemotherapy, HTN, HLD, and DM presenting with AMS. Positive for extensive acute deep vein thrombosis involving the left common and external iliac veins,  common femoral vein, femoral vein, popliteal vein, gastro veins and calf veins.    PT Comments    Pt performed treatment but remains lethargic and limited to bed to chair transfer.  Pt with stool incontinence but able to stand briefly after clean-up to perform steps from bed to chair.   Follow Up Recommendations  SNF;Supervision/Assistance - 24 hour     Equipment Recommendations  Other (comment) (TBA next session.  )    Recommendations for Other Services       Precautions / Restrictions Precautions Precautions: Fall Restrictions Weight Bearing Restrictions: No    Mobility  Bed Mobility Overal bed mobility: Needs Assistance Bed Mobility: Supine to Sit     Supine to sit: Max assist;+2 for physical assistance;HOB elevated     General bed mobility comments: Pt initially found incontinent of stool.  Pt required increase time to arouse due to lethargic presentation.  VC's for sequencing and technique. Bed pad used for extensive assist of trunk to EOB. Pt able to initiate some LE movement but very difficult to control trunk and R UE.   Transfers Overall transfer level: Needs assistance Equipment used: 2 person hand held assist Transfers: Sit to/from [employer] Transfers   Stand pivot transfers: Mod assist;+2 physical assistance       General transfer comment: VC's for hand placement on therapist's arm for support -  she was able to maintain on L UE throughout transfers but more difficult on R UE. Bed pad used to initiate transfer to standing.  Was able to take a few pivoting steps around to the chair.   Ambulation/Gait                 Stairs            Wheelchair Mobility    Modified Rankin (Stroke Patients Only)       Balance Overall balance assessment: Needs assistance Sitting-balance support: Feet unsupported;Bilateral upper extremity supported Sitting balance-Leahy Scale: Poor Sitting balance - Comments: Pt unable to maintain sitting balance for more than a few seconds without HHA. Continued to collapse to R UE even when cued not to.  Postural control: Posterior lean;Right lateral lean   Standing balance-Leahy Scale: Poor                      Cognition Arousal/Alertness: Awake/alert Behavior During Therapy: Flat affect Overall Cognitive Status: Within Functional Limits for tasks assessed                 General Comments: nurse reported pt been out of it on and off all day but was accepting of treatment    Exercises      General Comments        Pertinent Vitals/Pain Pain Assessment: No/denies pain Faces Pain Scale: Hurts little more Pain Intervention(s): Monitored during session;Repositioned    Home Living                      Prior Function            PT Goals (current goals can now be found in the  care plan section) Acute Rehab PT Goals PT Goal Formulation: Patient unable to participate in goal setting Potential to Achieve Goals: Fair Progress towards PT goals: Progressing toward goals    Frequency    Min 2X/week      PT Plan Current plan remains appropriate    Co-evaluation             End of Session Equipment Utilized During Treatment: Gait belt Activity Tolerance: Patient limited by fatigue;Patient limited by lethargy Patient left: in chair;with call bell/phone within reach;with chair alarm set (R arm supported by pillows.  )     [id number] PT Time Calculation (min) (ACUTE ONLY): 25 min  Charges:  $Therapeutic Activity: 23-37  mins                    G Codes:      Cristela Blue September 12, 2016, 4:42 PM Governor Rooks, PTA pager [phone]

## 2016-09-09 ENCOUNTER — Inpatient Hospital Stay (HOSPITAL_COMMUNITY): Payer: Medicare Other

## 2016-09-09 DIAGNOSIS — R627 Adult failure to thrive: Secondary | ICD-10-CM

## 2016-09-09 DIAGNOSIS — Z86718 Personal history of other venous thrombosis and embolism: Secondary | ICD-10-CM

## 2016-09-09 LAB — BASIC METABOLIC PANEL
ANION GAP: 5 (ref 5–15)
BUN: 15 mg/dL (ref 6–20)
CALCIUM: 8.2 mg/dL — AB (ref 8.9–10.3)
CHLORIDE: 114 mmol/L — AB (ref 101–111)
CO2: 26 mmol/L (ref 22–32)
CREATININE: 0.43 mg/dL — AB (ref 0.44–1.00)
GFR calc non Af Amer: >60 mL/min (ref >60)
Glucose, Bld: 107 mg/dL — ABNORMAL HIGH (ref 65–99)
Potassium: 3.7 mmol/L (ref 3.5–5.1)
SODIUM: 145 mmol/L (ref 135–145)

## 2016-09-09 LAB — CSF CELL COUNT WITH DIFFERENTIAL
RBC Count, CSF: 3860 /mm3 — ABNORMAL HIGH
TUBE #: 1
WBC, CSF: 6 /mm3 — ABNORMAL HIGH (ref 0–5)

## 2016-09-09 LAB — PHOSPHORUS: PHOSPHORUS: 2.4 mg/dL — AB (ref 2.5–4.6)

## 2016-09-09 LAB — MAGNESIUM: MAGNESIUM: 1.7 mg/dL (ref 1.7–2.4)

## 2016-09-09 MED ORDER — MAGNESIUM SULFATE IN D5W 1-5 GM/100ML-% IV SOLN
1.0000 g | Freq: Once | INTRAVENOUS | Status: AC
  Administered 2016-09-09: 1 g via INTRAVENOUS
  Filled 2016-09-09: qty 100

## 2016-09-09 MED ORDER — LIDOCAINE HCL 1 % IJ SOLN
INTRAMUSCULAR | Status: AC
  Filled 2016-09-09: qty 10

## 2016-09-09 MED ORDER — MEGESTROL ACETATE 400 MG/10ML PO SUSP
400.0000 mg | Freq: Every day | ORAL | Status: DC
  Administered 2016-09-09 – 2016-09-14 (×6): 400 mg via ORAL
  Filled 2016-09-09 (×7): qty 10

## 2016-09-09 MED ORDER — SODIUM CHLORIDE 0.9 % IV SOLN
INTRAVENOUS | Status: DC
  Administered 2016-09-09 – 2016-09-10 (×2): via INTRAVENOUS

## 2016-09-09 MED ORDER — MODAFINIL 100 MG PO TABS
100.0000 mg | ORAL_TABLET | Freq: Every day | ORAL | Status: DC
  Administered 2016-09-09 – 2016-09-18 (×10): 100 mg via ORAL
  Filled 2016-09-09 (×10): qty 1

## 2016-09-09 MED ORDER — LIDOCAINE HCL (PF) 1 % IJ SOLN
5.0000 mL | Freq: Once | INTRAMUSCULAR | Status: AC
  Administered 2016-09-09: 10 mL via INTRADERMAL
  Filled 2016-09-09: qty 5

## 2016-09-09 MED ORDER — ENOXAPARIN SODIUM 80 MG/0.8ML ~~LOC~~ SOLN
1.5000 mg/kg | Freq: Once | SUBCUTANEOUS | Status: AC
  Administered 2016-09-09: 23:00:00 80 mg via SUBCUTANEOUS
  Filled 2016-09-09: qty 0.8

## 2016-09-09 MED ORDER — SODIUM CHLORIDE 0.9 % IV SOLN
30.0000 meq | Freq: Once | INTRAVENOUS | Status: AC
  Administered 2016-09-09: 30 meq via INTRAVENOUS
  Filled 2016-09-09: qty 15

## 2016-09-09 MED ORDER — POTASSIUM PHOSPHATE MONOBASIC 500 MG PO TABS
500.0000 mg | ORAL_TABLET | Freq: Three times a day (TID) | ORAL | Status: DC
  Filled 2016-09-09: qty 1

## 2016-09-09 MED ORDER — POTASSIUM PHOSPHATE MONOBASIC 500 MG PO TABS
500.0000 mg | ORAL_TABLET | Freq: Three times a day (TID) | ORAL | Status: AC
  Administered 2016-09-09 (×2): 500 mg via ORAL
  Filled 2016-09-09 (×3): qty 1

## 2016-09-09 NOTE — Progress Notes (Signed)
Subjective: Awake and able to follow commands. Very weak.   Exam: Vitals:   09/08/16 2117 09/09/16 0548  BP: (!) 127/48 115/63  Pulse: 81 83  Resp: 18 18  Temp: 98.2 F (36.8 C) 98.3 F (36.8 C)    Gen: In bed, NAD Resp: non-labored breathing, no acute distress Abd: soft, nt  Neuro: MS: Somnolent, but does wake up and interact appropriately. She is alert and oriented to person and place. CN: Visual fields full, extracted movements intact Motor: She moves all extremities with relatively preserved strength. Sensory: Intact to light touch       [name] PA-C [facility] [phone]  Impression: 76 year old female with waxing/waning lethargy most consistent with delirium of unclear cause. Given that somnolence is to be very biggest issue, I wonder if adding a low-dose stimulant such as Provigil might be beneficial to her.  Recommendations: 1) lumbar puncture attempted but not able to be obtained. Will need to be done under IR.  2) could consider staring Provigil 3) neurology will continue to follow   Roland Rack, MD [facility] [phone]  If 7pm- 7am, please page neurology on call as listed in [place].    09/09/2016, 9:51 AM

## 2016-09-09 NOTE — Progress Notes (Addendum)
[facility] Progress Note  Patient: Christy Harrell [id number]   PCP: [name], MD DOB: 02-08-1941   DOA: 08/28/2016   DOS: 09/09/2016   Date of Service: the patient was seen and examined on 09/09/2016  Brief hospital course: Pt. with PMH of metastatic breast cancer, hypertension, diabetes mellitus, hyperlipidemia presenting with altered mental status. The patient is poor historian. Much of the history is provided through the patient's daughter who lives in [place]. Apparently, the patient's daughter noted that the patient was sleepy and repeating herself on the phone on the evening of 08/26/2016. On the morning of 08/27/2016, the patient's cousin was unable to reach the patient, and so the neighbor to check up on the patient. The patient was found sitting on the commode and unable to get up and walk. EMS was called. She was noted to be hypotensive at that time and given 500 mL bolus of fluid. The patient refused transfer to the hospital. On the morning of 08/28/2016, the patient's cousin went to check up on the patient again, and the patient was noted not to have moved from the chair in the denfrom the previous day. patient has had a gradual functional decline since August 2017. After initial workup being negative, Pt was transferred from [facility] to [facility] on 1/21 for further neurological work up. EEG was done and showed no evidence of epileptiform activity.   Currently further plan is Further workup of acute encephalopathy.  Assessment and Plan: Acute metabolic encephalopathy - Dehydration and volume depletion contributing to her encephalopathy, ? Paraneoplastic syndrome  - TSH 2.106 - Ammonia 26 - serum B12--1645 - RPR--neg - MRI of the brain with contrast and CT head both neg for metastasis. - Urinalysis negative for pyuria - neurology team consulted, EEG done but not revealing etiology of the encephalopathy  - discussed with Dr. [name]. - No sedative medications, no  evidence of infection or meningeal signs. - ABG shows hyperventilation. Mild hypoxia.  - Slowly getting better this morning, although still not at her baseline,  - Appreciate neurology input, unable to perform lumbar puncture twice. One at bedside as well as 1 by IR.  - Dry tap, Currently the plan is to hydrate the patient with IV fluids and reattempt LP Friday. - We will resume Lovenox dose starting tonight and hold it for Thursday night. - Attempting Provigil trial and as a last resort for improvement in patient's mentation.  Failure to thrive, severe PCM  - consulted nutrition, appreciate assistance  - Poor oral intake actually none. Getting better with more awake. - Add megace.  Metastatic breast cancer - Initially diagnosed in March 2012 - Metastasis noted initially May 2014--metastasis to bone and liver - Has had history of pathologic fracture to the humerus - has been receiving eribulin, last does 07/23/2017 - appreciate Dr. [name] following   Osteonecrosis of the jaw - per Dr. Karmen Bongo, Bone was biopsied and sent to [facility] with result read as osteonecrosis of the jaw, no cancer or evidence of infection noted - 08/28/2016 CT neck right mandible with large bony sequestrum - started Amox/Clav that was ordered by her oral surgeon but pt did not fill Rx, completed treatment here in the hospital  Hypokalemia Replace and Monitor  Thrombocytopenia, pancytopenia  - possibly due to acute DVT and ? Eliquis  - currently no signs of bleeding  - Serum B12--1645 - TSH 2.106  Acute DVT Left Lower Extremity - venous duplex--+DVT left common iliac and left external iliac -  started apixaban, switched to lovenox for need for LP - per pharmacy will need to hold lovenox for 24 hours prior and 4 hour after of LP.  DM2 - A1C 4.6 - allow liberal control - no need for SSI  Hyperlipidemia - continue statin  Dysphagia - evaluated by speech-->dys 2 with thin  liquids  Diarrhea  diarrhea frequency reducing. Cancel C. difficile test as less likely C. difficile.  Discussed patient's care plan with RN, pharmacy and care management multiple times during the day.  Bowel regimen: last BM 09/07/2016 Diet: dys 2 diet DVT Prophylaxis: on therapeutic anticoagulation.  Advance goals of care discussion: DNR DNI  Family Communication: no family was present at bedside, at the time of interview.  Discussed with daughter on the phone, she request to continue current plan of care.  Disposition:  Discharge to SNF. Expected discharge date: 09/10/2016, pending improvement in mentation  Consultants: oncology, neurology Procedures: none  Antibiotics: Anti-infectives    Start     Dose/Rate Route Frequency Ordered Stop   08/30/16 1245  amoxicillin-clavulanate (AUGMENTIN) 875-125 MG per tablet 1 tablet     1 tablet Oral Every 12 hours 08/30/16 1236 09/04/16 2142     Subjective: More awake, following the commands, answering questions.  Objective: Physical Exam: Vitals:   09/08/16 1433 09/08/16 2117 09/09/16 0548 09/09/16 1322  BP: (!) 108/48 (!) 127/48 115/63 100/60  Pulse: 94 81 83 69  Resp: 18 18 18    Temp: 98.6 F (37 C) 98.2 F (36.8 C) 98.3 F (36.8 C) 98.6 F (37 C)  TempSrc:    Oral  SpO2: 100% 100% 99% 100%  Weight:      Height:        Intake/Output Summary (Last 24 hours) at 09/09/16 1631 Last data filed at 09/09/16 1413  Gross per 24 hour  Intake           831.55 ml  Output              279 ml  Net           552.55 ml   Filed Weights   08/29/16 0041  Weight: 51.8 kg (114 lb 3.2 oz)    General: Alert and awake 2 oriented, still lethargic. Appear in mild distress, affect appropriate Eyes: PERRL, Conjunctiva normal ENT: Oral Mucosa clear moist. Neck: difficult to assess JVD, no Abnormal Mass Or lumps Cardiovascular: S1 and S2 Present, no Murmur, Respiratory: Bilateral Air entry equal and Decreased, no use of accessory  muscle, Clear to Auscultation, no Crackles, no wheezes Abdomen: Bowel Sound present, Soft and no tenderness Skin: no redness, no Rash, no induration Extremities: right upper extremity edema, trace Pedal edema, no calf tenderness  Data Reviewed: CBC:  Recent Labs Lab 09/03/16 0515 09/04/16 0415 09/06/16 0401 09/07/16 0405 09/08/16 0418  WBC 4.3 5.7 5.9 5.6 4.7  NEUTROABS  --   --   --   --  3.3  HGB 10.9* 11.1* 12.1 11.3* 10.5*  HCT 33.0* 34.0* 36.1 34.0* 31.5*  MCV 89.2 89.7 88.5 88.5 88.7  PLT 109* 121* 164 147* [id number]*   Basic Metabolic Panel:  Recent Labs Lab 09/04/16 0415 09/06/16 0401 09/07/16 0405 09/08/16 0418 09/09/16 1032  NA 139 140 141 142 145  K 3.4* 3.2* 3.1* 2.7* 3.7  CL 113* 113* 110 111 114*  CO2 22 21* 24 25 26   GLUCOSE 122* 119* 113* 107* 107*  BUN 12 17 19 18 15   CREATININE 0.52 0.49 0.52 0.48 0.43*  CALCIUM 8.4* 8.5* 8.7* 8.1* 8.2*  MG  --  1.8  --  1.7 1.7  PHOS  --   --   --  2.1* 2.4*    CBG:  Recent Labs Lab 09/05/16 2203 09/07/16 0851  GLUCAP 125* 111*   Studies: Dg Fluoro Guide Lumbar Puncture  Result Date: 09/09/2016 CLINICAL DATA:  Acute encephalopathy. EXAM: DIAGNOSTIC LUMBAR PUNCTURE UNDER FLUOROSCOPIC GUIDANCE FLUOROSCOPY TIME:  Fluoroscopy Time:  1.2 minutes Radiation Exposure Index (if provided by the fluoroscopic device): 20.4 mGy Number of Acquired Spot Images: 1 PROCEDURE: Informed consent was obtained from the patient's daughter prior to the procedure, including potential complications of headache, allergy, and pain. This study was also discussed with the patient. With the patient prone, the lower back was prepped with Betadine. 1% Lidocaine was used for local anesthesia. Patient had a decubitus ulcer pad which was prepped and then excluded from the field. Lumbar puncture was initially attempted at L5-S1, where there was the most capacious interspinous space. By feel and needle depth, intrathecal positioning was expected, but no  CSF return. After numbing, the L4-5 level was then briefly attempted, but could not reach beyond the severely hypertrophied posterior elements. The L3-4 level was then selected and numbed; this was still well within the initial prep. The thecal sac was entered in 1 pass. CSF that fluctuated with the respiratory cycle was seen in the clear needle hub. With head up positioning (as much as tolerated) only a scant volume of CSF could be obtained (0.5 cc). There were no apparent complications. Case discussed with Dr. Leonel Ramsay. IMPRESSION: Very low pressure CSF, with only 0.5 cc collected at L3-4. Abnormally yellow tinged CSF. Electronically Signed   By: Monte Fantasia M.D.   On: 09/09/2016 15:49     Scheduled Meds: . chlorhexidine  15 mL Mouth Rinse BID  . enoxaparin (LOVENOX) injection  1.5 mg/kg Subcutaneous Once  . feeding supplement (ENSURE ENLIVE)  237 mL Oral TID BM  . folic acid  1 mg Oral Daily  . levothyroxine  175 mcg Oral QAC breakfast  . lidocaine      . magnesium sulfate 1 - 4 g bolus IVPB  1 g Intravenous Once  . mouth rinse  15 mL Mouth Rinse q12n4p  . modafinil  100 mg Oral Daily  . potassium chloride  20 mEq Oral BID  . potassium chloride (KCL MULTIRUN) 30 mEq in 265 mL IVPB  30 mEq Intravenous Once  . potassium phosphate (monobasic)  500 mg Oral TID WC & HS   Continuous Infusions: . sodium chloride     PRN Meds: acetaminophen **OR** acetaminophen, docusate sodium, lip balm, lubiprostone, magic mouthwash, ondansetron **OR** ondansetron (ZOFRAN) IV, sodium chloride flush  Time spent: 30 minutes  Author: Berle Mull, MD [facility] Pager: [phone] 09/09/2016 4:31 PM  If 7PM-7AM, please contact night-coverage at [email or web address], password [facility]

## 2016-09-09 NOTE — Progress Notes (Signed)
Christy Harrell   DOB:1941-07-30   DJ#:[id number]   QQI#:[id number]  Subjective: Christy Harrell is alert and pleasant, knows I am an MD ("hello, Doc") but does not know my name, does not know where she is, and has no idea what year it is. Denies pain. In particular the jaw problems seem better as she is not constantly making mouth movements as before; no family in room  Note patient's baseline is well-oriented and capable of self care-- lived independently up to this admission..   Objective: elderly African American woman examined in bed Vitals:   09/08/16 2117 09/09/16 0548  BP: (!) 127/48 115/63  Pulse: 81 83  Resp: 18 18  Temp: 98.2 F (36.8 C) 98.3 F (36.8 C)    Body mass index is 22.3 kg/m.  Intake/Output Summary (Last 24 hours) at 09/09/16 0730 Last data filed at 09/09/16 0500  Gross per 24 hour  Intake           831.55 ml  Output              275 ml  Net           556.55 ml   Sclerae unicteric, EOMs intact, pupils round and equal Oropharynx shows no thrush, s/p multiple extractions No cervical or supraclavicular adenopathy Lungs no rales or rhonchi, auscultated anterolaterally Heart regular rate and rhythm Abd soft, nontender, positive bowel sounds Grade 3 chronic left upper extremity lymphedema Neuro:  Speech is clear, affect pleasant and follows one step commands, disoriented X3, exam nonfocal Breasts: Deferred   CBG (last 3)   Recent Labs  09/07/16 0851  GLUCAP 111*     Labs:  Lab Results  Component Value Date   WBC 4.7 09/08/2016   HGB 10.5 (L) 09/08/2016   HCT 31.5 (L) 09/08/2016   MCV 88.7 09/08/2016   PLT 104 (L) 09/08/2016   NEUTROABS 3.3 09/08/2016    @LASTCHEMISTRY @  Urine Studies No results for input(s): UHGB, CRYS in the last 72 hours.  Invalid input(s): UACOL, UAPR, USPG, UPH, UTP, UGL, UKET, UBIL, UNIT, UROB, [place], UEPI, UWBC, Duwayne Heck [place], [place]  Basic Metabolic Panel:  Recent Labs Lab 09/03/16 1530 09/04/16 0415  09/06/16 0401 09/07/16 0405 09/08/16 0418  NA 138 139 140 141 142  K 3.5 3.4* 3.2* 3.1* 2.7*  CL 112* 113* 113* 110 111  CO2 22 22 21* 24 25  GLUCOSE 116* 122* 119* 113* 107*  BUN 11 12 17 19 18   CREATININE 0.51 0.52 0.49 0.52 0.48  CALCIUM 7.9* 8.4* 8.5* 8.7* 8.1*  MG  --   --  1.8  --  1.7  PHOS  --   --   --   --  2.1*   GFR Estimated Creatinine Clearance: 43.6 mL/min (by C-G formula based on SCr of 0.48 mg/dL). Liver Function Tests:  Recent Labs Lab 09/08/16 0418  AST 22  ALT 18  ALKPHOS 71  BILITOT 1.0  PROT 4.9*  ALBUMIN 2.3*   No results for input(s): LIPASE, AMYLASE in the last 168 hours.  Recent Labs Lab 09/08/16 0418  AMMONIA 31   Coagulation profile  Recent Labs Lab 09/08/16 0418  INR 1.25    CBC:  Recent Labs Lab 09/03/16 0515 09/04/16 0415 09/06/16 0401 09/07/16 0405 09/08/16 0418  WBC 4.3 5.7 5.9 5.6 4.7  NEUTROABS  --   --   --   --  3.3  HGB 10.9* 11.1* 12.1 11.3* 10.5*  HCT 33.0* 34.0* 36.1 34.0* 31.5*  MCV 89.2 89.7 88.5 88.5 88.7  PLT 109* 121* 164 147* 104*   Cardiac Enzymes: No results for input(s): CKTOTAL, CKMB, CKMBINDEX, TROPONINI in the last 168 hours. BNP: Invalid input(s): POCBNP CBG:  Recent Labs Lab 09/05/16 2203 09/07/16 0851  GLUCAP 125* 111*   D-Dimer No results for input(s): DDIMER in the last 72 hours. Hgb A1c No results for input(s): HGBA1C in the last 72 hours. Lipid Profile No results for input(s): CHOL, HDL, LDLCALC, TRIG, CHOLHDL, LDLDIRECT in the last 72 hours. Thyroid function studies No results for input(s): TSH, T4TOTAL, T3FREE, THYROIDAB in the last 72 hours.  Invalid input(s): FREET3 Anemia work up  Recent Labs  09/08/16 [place] 943*   Microbiology Recent Results (from the past 240 hour(s))  Culture, Urine     Status: None   Collection Time: 09/03/16  7:09 AM  Result Value Ref Range Status   Specimen Description URINE, CATHETERIZED  Final   Special Requests NONE  Final    Culture NO GROWTH  Final   Report Status 09/04/2016 FINAL  Final      Studies:  No results found.  Assessment: 76 y.o. [place] woman with stage IV breast cancer as of May 2014  (1)  status post right lower outer quadrant lumpectomy and axillary lymph node dissection 11/17/2010 for a pT2 pN1, stage IIB invasive ductal carcinoma, grade 2, estrogen and progesterone receptor positive, HER-2 negative,   (2) Oncotype DX recurrence score of 27 predicting a risk of distant recurrence of 18% with 5 years of tamoxifen (intermediate score);   (3)  status post radiation completed July of 2012,   (4) started anastrozole July 2012, discontinued June 2014 with evidence of metastatic spread to bone  (5) Chronic lymphedema in the right upper extremity.  (6) pathologic fracture of the left humerus along apparent lytic lesion, with no other bone lesions per bone scan 12/12/2012  (7) on 12/27/2012 underwent #1 intramedullary nail left pathologic proximal humerus fracture  #2 left shoulder rotator cuff repair  #3 left shoulder open bone biopsy with pathology showing metastatic breast adenocarcinoma, estrogen receptor 100% positive, progesterone receptor and HER-2 negative, with an MIB-1 of 26%. PET scan 01/13/2013 showed no other areas of disease   (8) status post 35 Gy to the left proximal humerus completed 03/13/2013(  (9) fulvestrant, started on 01/23/2013, stopped May 2017 with progression  (10) zolendronic acid given monthly, first dose 01/23/2013; changed to denosumab as of August 2015 because of access problems, being given every three months             (a) May 2017 dose held after several extractions--on hold pending definitive dental clearance  (11) multiple liver lesions noted on scans April 2015              (12) letrozole added May 2015, stopped 04/18/14 because of progression in her liver noted on a chest CT performed on 03/14/14  (13) everolimus and exemestane started  04/19/14, stopped May 2017             (a) MRI of the abdomen 07/12/15 showed stable disease             (b) repeat abdominal MRI 12/04/2015 showed progression  (14) started CMF chemotherapy (cyclophosphamide, methotrexate, fluorouracil) 12/24/2015             (a) admitted 02/18/2016 with febrile neutropenia, negative cultures             (b) axillofacial CT scan 02/19/2016 shows no  soft-tissue abscessbut lucencies in the roots of multiple upper and lower teeth may indicate periapical abscesses             (c) MRI of the liver 02/26/2016 shows disease progression: CMF discontinued  (15) started eribulin 03/31/2016, to be repeated days 1 and 8 of each cycle             (a) cycle 3 delayed one week because of malaise             (b) treatments switched to every 14 days beginning with cycle 4   (c) last eribulin dose 07/23/2016, therapy interrupted by current problem  (16) Left lower extremity DVT documented by doppler 08/29/2016-- on apixaban  (17) confusion with variable sensorium, negative workup so far   Plan:  LP pending-- discussed with patient, who however may have no memory of our discussion. Meningeal carcinomatosis, herpes or other encephalitides a concern.  If LP completely negative I am out of suggestions re work up and I am not seeing other suggestions from neuro. We would have to consider SNF as patient is not able to care for herself. Patient's daughter lives out of town and may wish to have her mother stay with her or in a facility near her-- SW can begin to help her operationalize this.  I will be out of town 02/02 - 02/11 but please contact my partners on call as needed.  Greatly appreciate your help to this patient!  Chauncey Cruel, MD 09/09/2016  7:30 AM [facility] [address] [place], [place] [address] Tel. [phone]    Fax. [phone]

## 2016-09-09 NOTE — Procedures (Signed)
Difficult LP due to degenerative changes, but successful at L3/4.  Very low pressure CSF, only 0.5 cc of abnormal yellow tinged fluid could be collected.  Owens Shark MD

## 2016-09-09 NOTE — Procedures (Signed)
Indication: evaluate for carcinomatous meningitis   Risks of the procedure were dicussed with the patient daughter including post-LP headache, bleeding, infection, weakness/numbness of legs(radiculopathy), death.  The patient's daughter agreed and written consent was obtained.   The patient was prepped and draped, and using sterile technique.  She was attempted sitting up and this was not possible do to patient pushing to the left and unable to remain still. Then attempted after resteralized on her side but again could not locate good land marks. This will need to be attempted under fluoroscopy where vertebral bodies and disks are visualized for patient safety.    [name] PA-C [facility] [phone]  M-F  (8:30 am- 4 PM)  09/09/2016, 9:50 AM

## 2016-09-09 NOTE — Progress Notes (Signed)
Pt has order for potassium phosphate(monobasic), med sent from pharmacy is Phosphorous supplement. Pharmacy called to verify order. Confirmed by pharmacy it is ok to administer sent medication.  Med given as ordered.

## 2016-09-10 LAB — COMPREHENSIVE METABOLIC PANEL
ALBUMIN: 2.3 g/dL — AB (ref 3.5–5.0)
ALK PHOS: 72 U/L (ref 38–126)
ALT: 20 U/L (ref 14–54)
ANION GAP: 3 — AB (ref 5–15)
AST: 22 U/L (ref 15–41)
BUN: 12 mg/dL (ref 6–20)
CHLORIDE: 115 mmol/L — AB (ref 101–111)
CO2: 25 mmol/L (ref 22–32)
CREATININE: 0.41 mg/dL — AB (ref 0.44–1.00)
Calcium: 7.5 mg/dL — ABNORMAL LOW (ref 8.9–10.3)
GFR calc non Af Amer: >60 mL/min (ref >60)
GLUCOSE: 87 mg/dL (ref 65–99)
Potassium: 3.1 mmol/L — ABNORMAL LOW (ref 3.5–5.1)
SODIUM: 143 mmol/L (ref 135–145)
Total Bilirubin: 0.8 mg/dL (ref 0.3–1.2)
Total Protein: 4.7 g/dL — ABNORMAL LOW (ref 6.5–8.1)

## 2016-09-10 LAB — CBC WITH DIFFERENTIAL/PLATELET
BASOS PCT: 0 %
Basophils Absolute: 0 10*3/uL (ref 0.0–0.1)
EOS PCT: 1 %
Eosinophils Absolute: 0 10*3/uL (ref 0.0–0.7)
HCT: 30.2 % — ABNORMAL LOW (ref 36.0–46.0)
HEMOGLOBIN: 9.9 g/dL — AB (ref 12.0–15.0)
LYMPHS ABS: 1.1 10*3/uL (ref 0.7–4.0)
Lymphocytes Relative: 26 %
MCH: 30 pg (ref 26.0–34.0)
MCHC: 32.8 g/dL (ref 30.0–36.0)
MCV: 91.5 fL (ref 78.0–100.0)
Monocytes Absolute: 0.3 10*3/uL (ref 0.1–1.0)
Monocytes Relative: 7 %
NEUTROS PCT: 67 %
Neutro Abs: 2.7 10*3/uL (ref 1.7–7.7)
PLATELETS: 85 10*3/uL — AB (ref 150–400)
RBC: 3.3 MIL/uL — AB (ref 3.87–5.11)
RDW: 19.5 % — ABNORMAL HIGH (ref 11.5–15.5)
WBC: 4.1 10*3/uL (ref 4.0–10.5)

## 2016-09-10 LAB — PHOSPHORUS: PHOSPHORUS: 2.5 mg/dL (ref 2.5–4.6)

## 2016-09-10 LAB — C DIFFICILE QUICK SCREEN W PCR REFLEX
C DIFFICILE (CDIFF) INTERP: NOT DETECTED
C DIFFICLE (CDIFF) ANTIGEN: NEGATIVE
C Diff toxin: NEGATIVE

## 2016-09-10 LAB — MAGNESIUM: Magnesium: 1.9 mg/dL (ref 1.7–2.4)

## 2016-09-10 MED ORDER — SODIUM CHLORIDE 0.9 % IV SOLN
Freq: Once | INTRAVENOUS | Status: AC
  Administered 2016-09-11: 03:00:00 via INTRAVENOUS

## 2016-09-10 MED ORDER — LOPERAMIDE HCL 2 MG PO CAPS: 2.0000 mg | ORAL_CAPSULE | ORAL | Status: DC | PRN

## 2016-09-10 MED ORDER — POTASSIUM CHLORIDE 20 MEQ/15ML (10%) PO SOLN
40.0000 meq | Freq: Two times a day (BID) | ORAL | Status: DC
  Administered 2016-09-10 – 2016-09-14 (×9): 40 meq via ORAL
  Filled 2016-09-10 (×11): qty 30

## 2016-09-10 MED ORDER — KCL IN DEXTROSE-NACL 20-5-0.45 MEQ/L-%-% IV SOLN
INTRAVENOUS | Status: DC
  Administered 2016-09-10 – 2016-09-13 (×6): via INTRAVENOUS
  Filled 2016-09-10 (×8): qty 1000

## 2016-09-10 NOTE — Progress Notes (Signed)
Speech Language Pathology Treatment: Dysphagia  Patient Details Name: Christy Harrell MRN: [id number] DOB: Oct 08, 1940 Today's Date: 09/10/2016 Time: [id number] SLP Time Calculation (min) (ACUTE ONLY): 13 min  Assessment / Plan / Recommendation Clinical Impression  Christy Harrell was alert but not oriented to place or time during treatment, asking the date and location several times. SLP removed minimal labial residue prior to pt's consumption of POs. Pt was observed with thin liquids via straw and puree with no overt s/s of aspiration at bedside, however significant oral holding and pocketing was noted with pureed solids. Due to pt's continued state of confusion, recommend continuing with current diet: dysphagia 1 solids, thin liquids (straws ok), meds whole in puree; check for oral pocketing. ST will f/u briefly next week.   HPI HPI: 76 year old female with a history of metastatic breast cancer, hypertension, diabetes mellitus, hyperlipidemia presenting with altered mental status. Pt also with osteonecrosis of the jaw with recent dental extraction and bone biopsy. CT Neck 1/19 showed large bony sequestrum.      SLP Plan  Continue with current plan of care     Recommendations  Diet recommendations: Dysphagia 1 (puree) Liquids provided via: Straw;Cup Medication Administration: Whole meds with puree Supervision: Patient able to self feed;Full supervision/cueing for compensatory strategies;Staff to assist with self feeding Compensations: Slow rate;Small sips/bites;Minimize environmental distractions Postural Changes and/or Swallow Maneuvers: Seated upright 90 degrees                Oral Care Recommendations: Oral care BID Follow up Recommendations: 24 hour supervision/assistance Plan: Continue with current plan of care       GO                Christy Harrell 09/10/2016, 11:27 AM

## 2016-09-10 NOTE — Progress Notes (Signed)
COURTESY NOTE:   Appreciate attempts at obtaining CSF on this patient. Hopefully that will yield a diagnosis which may guide treatment (for instance if infection) or management (if cytology positive I would recommend Hospice referral)  I will be our of town the next week. Please consult my partners as needed. If the patient is discharged before I return please let my office know to schedule a follow up with me within a week of discharge ([phone])  Thank you for your help to this patient!

## 2016-09-10 NOTE — Progress Notes (Addendum)
Subjective: Seems more awake today. Still confused.    Exam: Vitals:   09/10/16 0558 09/10/16 1333  BP: (!) 105/59 (!) 102/54  Pulse: 64 74  Resp: 18 15  Temp: 97.8 F (36.6 C) 98.4 F (36.9 C)    Gen: In bed, NAD Resp: non-labored breathing, no acute distress Abd: soft, nt  Neuro: MS: awake. but does wake up and interact appropriately. She is alert and oriented to person and hospital, but not which one.  CN: Visual fields full, extracted movements intact Motor: She moves all extremities with relatively preserved strength. Sensory: Intact to light touch   Impression: 76 year old female with waxing/waning lethargy most consistent with delirium of unclear cause. Given that somnolence is to be very biggest issue, I wonder if adding a low-dose stimulant such as Provigil might be beneficial to her.  Recommendations: 1) lumbar puncture was very low volume, does not look infectious, but still feel that cytology/autoimmune profile would be important. Plan is to hydrate and re-attempt under fluoro tomorrow.  2) continue provigil.  3) neurology will continue to follow   Roland Rack, MD [facility] [phone]  If 7pm- 7am, please page neurology on call as listed in AMION. 09/10/2016, 4:36 PM

## 2016-09-10 NOTE — Progress Notes (Signed)
[facility] Progress Note  Patient: Christy Harrell [id number]   PCP: [name], MD DOB: [birth year]   DOA: 08/28/2016   DOS: 09/10/2016   Date of Service: the patient was seen and examined on 09/10/2016  Brief hospital course: Pt. with PMH of metastatic breast cancer, hypertension, diabetes mellitus, hyperlipidemia presenting with altered mental status. The patient is poor historian. Much of the history is provided through the patient's daughter who lives in [place]. Apparently, the patient's daughter noted that the patient was sleepy and repeating herself on the phone on the evening of 08/26/2016. On the morning of 08/27/2016, the patient's cousin was unable to reach the patient, and so the neighbor to check up on the patient. The patient was found sitting on the commode and unable to get up and walk. EMS was called. She was noted to be hypotensive at that time and given 500 mL bolus of fluid. The patient refused transfer to the hospital. On the morning of 08/28/2016, the patient's cousin went to check up on the patient again, and the patient was noted not to have moved from the chair in the denfrom the previous day. patient has had a gradual functional decline since August 2017. After initial workup being negative, Pt was transferred from [facility] to [facility] on 1/21 for further neurological work up. EEG was done and showed no evidence of epileptiform activity.   Currently further plan is Further workup of acute encephalopathy.  Assessment and Plan: Acute metabolic encephalopathy - Dehydration and volume depletion contributing to her encephalopathy, ? Paraneoplastic syndrome  - TSH 2.106 - Ammonia 26 - serum B12--1645 - RPR--neg - MRI of the brain with contrast and CT head both neg for metastasis. - Urinalysis negative for pyuria - neurology team consulted, EEG done but not revealing etiology of the encephalopathy  - discussed with Dr. [name]. - No sedative medications, no evidence  of infection or meningeal signs. - ABG shows hyperventilation. Mild hypoxia.  - Slowly getting better this morning, although still not at her baseline,  - Appreciate neurology input, unable to perform lumbar puncture twice. One at bedside as well as 1 by IR.  - Dry tap, Currently the plan is to hydrate the patient with IV fluids and reattempt LP Friday. - We will hold Lovenox Thursday night. - Attempting Provigil trial and as a last resort for improvement in patient's mentation.  Failure to thrive, severe PCM  - consulted nutrition, appreciate assistance  - Poor oral intake actually none. Getting better with more awake. - Add megace.  Metastatic breast cancer - Initially diagnosed in March 2012 - Metastasis noted initially May 2014--metastasis to bone and liver - Has had history of pathologic fracture to the humerus - has been receiving eribulin, last does 07/23/2017 - appreciate Dr. [name] following   Osteonecrosis of the jaw - per Dr. Karmen Bongo, Bone was biopsied and sent to [facility] with result read as osteonecrosis of the jaw, no cancer or evidence of infection noted - 08/28/2016 CT neck right mandible with large bony sequestrum - started Amox/Clav that was ordered by her oral surgeon but pt did not fill Rx, completed treatment here in the hospital  Hypokalemia Replace and Monitor Add to IV fluids, increase oral supplement from 20 to 40 bid.  Thrombocytopenia, pancytopenia  - possibly due to acute DVT, recent worsening is due to dilution from fluids, will monitor  - currently no signs of bleeding  - Serum B12--1645 - TSH 2.106  Acute DVT Left Lower  Extremity - venous duplex--+DVT left common iliac and left external iliac - started apixaban, switched to lovenox for need for LP - per pharmacy will need to hold lovenox for 24 hours prior and 4 hour after of LP.  DM2 - A1C 4.6 - allow liberal control - no need for SSI  Hyperlipidemia - continue  statin  Dysphagia - evaluated by speech-->dys 2 with thin liquids  Diarrhea  diarrhea frequency reducing. Negative C. Difficile, use imodium  Bowel regimen: last BM 09/10/2016 Diet: dys 1 diet DVT Prophylaxis: on therapeutic anticoagulation.  Advance goals of care discussion: DNR DNI  Family Communication: no family was present at bedside, at the time of interview.  Discussed with daughter on the phone on 09/09/2016, she request to continue current plan of care.  Disposition:  Discharge to SNF. Expected discharge date: 09/12/2016, pending improvement in mentation and oral intake  Consultants: oncology, neurology Procedures: LP  Antibiotics: Anti-infectives    Start     Dose/Rate Route Frequency Ordered Stop   08/30/16 1245  amoxicillin-clavulanate (AUGMENTIN) 875-125 MG per tablet 1 tablet     1 tablet Oral Every 12 hours 08/30/16 1236 09/04/16 2142     Subjective: More awake, following the commands, not oriented  Objective: Physical Exam: Vitals:   09/09/16 2047 09/09/16 2055 09/10/16 0558 09/10/16 1333  BP: 108/62  (!) 105/59 (!) 102/54  Pulse: (!) 29 76 64 74  Resp: 18  18 15   Temp: 98.4 F (36.9 C)  97.8 F (36.6 C) 98.4 F (36.9 C)  TempSrc:    Oral  SpO2: (!) 80% 98%  100%  Weight:      Height:        Intake/Output Summary (Last 24 hours) at 09/10/16 1549 Last data filed at 09/10/16 0602  Gross per 24 hour  Intake          1320.42 ml  Output              650 ml  Net           670.42 ml   Filed Weights   08/29/16 0041  Weight: 51.8 kg (114 lb 3.2 oz)    General: Alert and awake 2 oriented, less lethargic. Appear in mild distress, affect appropriate Eyes: PERRL, Conjunctiva normal ENT: Oral Mucosa clear moist. Neck: difficult to assess JVD, no Abnormal Mass Or lumps Cardiovascular: S1 and S2 Present, no Murmur, Respiratory: Bilateral Air entry equal and Decreased, no use of accessory muscle, Clear to Auscultation, no Crackles, no  wheezes Abdomen: Bowel Sound present, Soft and no tenderness Skin: no redness, no Rash, no induration Extremities: right upper extremity edema, trace Pedal edema, no calf tenderness  Data Reviewed: CBC:  Recent Labs Lab 09/04/16 0415 09/06/16 0401 09/07/16 0405 09/08/16 0418 09/10/16 0528  WBC 5.7 5.9 5.6 4.7 4.1  NEUTROABS  --   --   --  3.3 2.7  HGB 11.1* 12.1 11.3* 10.5* 9.9*  HCT 34.0* 36.1 34.0* 31.5* 30.2*  MCV 89.7 88.5 88.5 88.7 91.5  PLT 121* 164 147* 104* 85*   Basic Metabolic Panel:  Recent Labs Lab 09/06/16 0401 09/07/16 0405 09/08/16 0418 09/09/16 1032 09/10/16 0528  NA 140 141 142 145 143  K 3.2* 3.1* 2.7* 3.7 3.1*  CL 113* 110 111 114* 115*  CO2 21* 24 25 26 25   GLUCOSE 119* 113* 107* 107* 87  BUN 17 19 18 15 12   CREATININE 0.49 0.52 0.48 0.43* 0.41*  CALCIUM 8.5* 8.7* 8.1* 8.2* 7.5*  MG 1.8  --  1.7 1.7 1.9  PHOS  --   --  2.1* 2.4* 2.5    CBG:  Recent Labs Lab 09/05/16 2203 09/07/16 0851  GLUCAP 125* 111*   Studies: No results found.   Scheduled Meds: . sodium chloride   Intravenous Once  . chlorhexidine  15 mL Mouth Rinse BID  . feeding supplement (ENSURE ENLIVE)  237 mL Oral TID BM  . folic acid  1 mg Oral Daily  . levothyroxine  175 mcg Oral QAC breakfast  . mouth rinse  15 mL Mouth Rinse q12n4p  . megestrol  400 mg Oral Daily  . modafinil  100 mg Oral Daily  . potassium chloride  40 mEq Oral BID   Continuous Infusions: . dextrose 5 % and 0.45 % NaCl with KCl 20 mEq/L     PRN Meds: acetaminophen **OR** acetaminophen, docusate sodium, lip balm, loperamide, lubiprostone, magic mouthwash, ondansetron **OR** ondansetron (ZOFRAN) IV, sodium chloride flush  Time spent: 30 minutes  Author: Berle Mull, MD [facility] Pager: [phone] 09/10/2016 3:49 PM  If 7PM-7AM, please contact night-coverage at [email or web address], password [facility]

## 2016-09-10 NOTE — Care Management Important Message (Signed)
Important Message  Patient Details  Name: Christy Harrell MRN: [id number] Date of Birth: 06/12/1941   Medicare Important Message Given:  Yes    Azha Constantin [name] 09/10/2016, 12:05 PM

## 2016-09-11 ENCOUNTER — Inpatient Hospital Stay (HOSPITAL_COMMUNITY): Payer: Medicare Other

## 2016-09-11 LAB — CSF CELL COUNT WITH DIFFERENTIAL
RBC Count, CSF: 3670 /mm3 — ABNORMAL HIGH
Tube #: 3
WBC, CSF: 2 /mm3 (ref 0–5)

## 2016-09-11 LAB — CRYPTOCOCCAL ANTIGEN, CSF: Crypto Ag: NEGATIVE

## 2016-09-11 LAB — BASIC METABOLIC PANEL
ANION GAP: 3 — AB (ref 5–15)
BUN: 7 mg/dL (ref 6–20)
CHLORIDE: 114 mmol/L — AB (ref 101–111)
CO2: 22 mmol/L (ref 22–32)
CREATININE: 0.4 mg/dL — AB (ref 0.44–1.00)
Calcium: 7.6 mg/dL — ABNORMAL LOW (ref 8.9–10.3)
GFR calc non Af Amer: >60 mL/min (ref >60)
GLUCOSE: 137 mg/dL — AB (ref 65–99)
Potassium: 3 mmol/L — ABNORMAL LOW (ref 3.5–5.1)
Sodium: 139 mmol/L (ref 135–145)

## 2016-09-11 LAB — PROTEIN, CSF: TOTAL PROTEIN, CSF: 17 mg/dL (ref 15–45)

## 2016-09-11 LAB — MAGNESIUM: Magnesium: 1.7 mg/dL (ref 1.7–2.4)

## 2016-09-11 LAB — GLUCOSE, CSF: GLUCOSE CSF: 70 mg/dL (ref 40–70)

## 2016-09-11 MED ORDER — METHYLPREDNISOLONE SODIUM SUCC 1000 MG IJ SOLR
1000.0000 mg | Freq: Every day | INTRAMUSCULAR | Status: AC
  Administered 2016-09-11 – 2016-09-13 (×3): 1000 mg via INTRAVENOUS
  Filled 2016-09-11 (×4): qty 8

## 2016-09-11 MED ORDER — ENOXAPARIN SODIUM 80 MG/0.8ML ~~LOC~~ SOLN
1.5000 mg/kg | SUBCUTANEOUS | Status: DC
  Administered 2016-09-11 – 2016-09-15 (×5): 80 mg via SUBCUTANEOUS
  Filled 2016-09-11 (×5): qty 0.8

## 2016-09-11 MED ORDER — LIDOCAINE HCL (PF) 1 % IJ SOLN
2.0000 mL | Freq: Once | INTRAMUSCULAR | Status: AC
  Administered 2016-09-11: 2 mL via INTRADERMAL
  Filled 2016-09-11: qty 2

## 2016-09-11 NOTE — Progress Notes (Signed)
CSW will continue to follow for eventual transition to [facility] SNF when pt is stable  Jorge Ny, [place] Social Worker [phone]

## 2016-09-11 NOTE — Progress Notes (Signed)
Pt could only swallow half of her Megace and potassium before she tired.

## 2016-09-11 NOTE — Progress Notes (Signed)
PT Cancellation Note  Patient Details Name: Christy Harrell MRN: [id number] DOB: October 22, 1940   Cancelled Treatment:    Reason Eval/Treat Not Completed: Patient at procedure or test/unavailable (Pt down for lumbar puncture; waiting for orders on length of bed rest. Will continue efforts if time permits.)   [facility] 09/11/2016, 10:30 AM

## 2016-09-11 NOTE — Progress Notes (Signed)
Subjective: No complaints appears to be more alert today  Exam: Vitals:   09/11/16 0524 09/11/16 0650  BP: (!) 94/54 (!) 106/59  Pulse: 83 71  Resp: 18   Temp: 98.1 F (36.7 C)     Extremities- no joint deformities, effusion, or inflammation Lymph-no adenopathy palpable Musculoskeletal-no joint tenderness, deformity or swelling Skin-warm and dry, no hyperpigmentation, vitiligo, or suspicious lesions    Gen: In bed, NAD MS: Patient is alert aware that she is at the hospital, more interactive today, attempts to follow the commands that asked CN: EOMs intact, able to count fingers in all visual fields, face is symmetric Motor: Moving upper extremities antigravity, lower extremities weaker with 3 -/5 strength able to wiggle toes Sensory: Intact to light touch   Pertinent Labs/Diagnostics: Tube #  1   Color, CSF COLORLESS RED    Appearance, CSF CLEAR CLOUDY    Supernatant  YELLOW   RBC Count, CSF 0 /cu mm 3,860    WBC, CSF 0 - 5 /cu mm 6    Segmented Neutrophils-CSF 0 - 6 % FEW   Lymphs, CSF 40 - 80 % FEW   Monocyte-Macrophage-Spinal Fluid 15 - 45 % RARE    Patient has been hydrated overnight and LP will be reattempted under interventional radiology today  [name] PA-C [facility] [phone]  Her mental status continues to wax and wane, maybe some benefit from provigil though.  Impression: 76 year old female with waxing/waning lethargy most consistent with delirium of unclear cause. Today she is more alert -- he has received 100 mg of Provigil over the last 2 days this may be beneficial for her. I think that with a sudden mental status change in a previously healthy adult with cancer, autoimmune causes should be considered prior to moving to palliation. I do not have strong suspicion for this, but certainly can not exclude it. I would send a paraneoplastic panel on her CSF and in addition, given how long it will take to come back, start steroids. I had hoped  to get cytology before doing this, but given that it will likely be Monday before we get results there, I would favor not delaying and going ahead and trying IV steroids.   Recommendations: 1) IV solumedrol 1g daily x 5 days. 2) PAC1 panel to [facility] 3) Awaiting other LP results including cytology.    Roland Rack, MD [facility] [phone]  If 7pm- 7am, please page neurology on call as listed in [place].  09/11/2016, 9:03 AM

## 2016-09-11 NOTE — Progress Notes (Addendum)
ANTICOAGULATION CONSULT NOTE - Follow Up Consult  Pharmacy Consult for Lovenox while off Eliquis Indication: new LLE DVT  Allergies  Allergen Reactions  . Bydureon [Exenatide] Rash  . Gadolinium Derivatives Other (See Comments)    Pt began having chest numbness/tingling , stated her heart felt funny, had to take her to ED for EKG per RN   CAP  . Enalapril Rash    Patient Measurements: Height: 5' (152.4 cm) Weight: 114 lb 3.2 oz (51.8 kg) IBW/kg (Calculated) : 45.5  Vital Signs: Temp: 98.4 F (36.9 C) (02/02 1325) Temp Source: Oral (02/02 1325) BP: 88/61 (02/02 1325) Pulse Rate: 81 (02/02 1325)  Labs:  Recent Labs  09/09/16 1032 09/10/16 0528 09/11/16 1205  HGB  --  9.9*  --   HCT  --  30.2*  --   PLT  --  85*  --   CREATININE 0.43* 0.41* 0.40*    Estimated Creatinine Clearance: 43.6 mL/min (by C-G formula based on SCr of 0.4 mg/dL (L)).   Assessment: 76yo female on Lovenox 1.5mg /kg SQ q24 bridge while off Eliquis for new LLE DVT.  Lovenox was held 2/1 x 24 prior to LP and needs to be held for 4hr afterward.  Now s/p LP done today/ exam ended @ 10: 59AM  SCr 0.40 stable.  Hgb 9.9 on 2/1 and pltc 85K  (significant variability since 1/19, plct were 86K on 1/23 and up to 164 on 1/28).   Goal of Therapy:  Anti-Xa level 0.6-1 units/ml 4hrs after LMWH dose given Monitor platelets by anticoagulation protocol: Yes   Plan:  Resume Lovenox 1.5 mg/kg =80 mg SQ q24h today. Monitor for signs and symptoms of bleeding, renal function.  Nicole Cella, RPh Clinical Pharmacist Pager: [phone] 8A-4P [phone] M_F 4P-10P [place] [phone] 09/11/2016 4:59 PM

## 2016-09-11 NOTE — Progress Notes (Signed)
[facility] Progress Note  Patient: Christy Harrell [id number]   PCP: [name], MD DOB: July 08, 1941   DOA: 08/28/2016   DOS: 09/11/2016   Date of Service: the patient was seen and examined on 09/11/2016  Brief hospital course: Pt. with PMH of metastatic breast cancer, hypertension, diabetes mellitus, hyperlipidemia presenting with altered mental status. The patient is poor historian. Much of the history is provided through the patient's daughter who lives in [place]. Apparently, the patient's daughter noted that the patient was sleepy and repeating herself on the phone on the evening of 08/26/2016. On the morning of 08/27/2016, the patient's cousin was unable to reach the patient, and so the neighbor to check up on the patient. The patient was found sitting on the commode and unable to get up and walk. EMS was called. She was noted to be hypotensive at that time and given 500 mL bolus of fluid. The patient refused transfer to the hospital. On the morning of 08/28/2016, the patient's cousin went to check up on the patient again, and the patient was noted not to have moved from the chair in the denfrom the previous day. patient has had a gradual functional decline since August 2017. After initial workup being negative, Pt was transferred from [facility] to [facility] on 1/21 for further neurological work up. EEG was done and showed no evidence of epileptiform activity.   Currently further plan is Further workup of acute encephalopathy.  Assessment and Plan: Acute metabolic encephalopathy - Dehydration and volume depletion contributing to her encephalopathy, ? Paraneoplastic syndrome  - TSH 2.106 - Ammonia 26 - serum B12--1645 - RPR--neg - MRI of the brain with contrast and CT head both neg for metastasis. - Urinalysis negative for pyuria - neurology team consulted, EEG done but not revealing etiology of the encephalopathy  - discussed with Dr. [name]. - No sedative medications, no evidence  of infection or meningeal signs. - ABG shows hyperventilation. Mild hypoxia.  - Slowly getting better this morning, although still not at her baseline,  - Appreciate neurology input, unable to perform lumbar puncture twice. One at bedside as well as 1 by IR.  - Dry tap, repeat LP performed on 09/11/2016. - Attempting Provigil trial and as a last resort for improvement in patient's mentation. - Started the patient on high-dose steroid for suspected paraneoplastic syndrome.  Failure to thrive, severe PCM  - consulted nutrition, appreciate assistance  - Poor oral intake actually none. Getting better with more awake. - Add megace.  Metastatic breast cancer - Initially diagnosed in March 2012 - Metastasis noted initially May 2014--metastasis to bone and liver - Has had history of pathologic fracture to the humerus - has been receiving eribulin, last does 07/23/2017 - appreciate Dr. [name] following   Osteonecrosis of the jaw - per Dr. Karmen Bongo, Bone was biopsied and sent to [facility] with result read as osteonecrosis of the jaw, no cancer or evidence of infection noted - 08/28/2016 CT neck right mandible with large bony sequestrum - started Amox/Clav that was ordered by her oral surgeon but pt did not fill Rx, completed treatment here in the hospital  Hypokalemia Replace and Monitor Add to IV fluids, increase oral supplement from 20 to 40 bid.  Thrombocytopenia, pancytopenia  - possibly due to acute DVT, recent worsening is due to dilution from fluids, will monitor  - currently no signs of bleeding  - Serum B12--1645 - TSH 2.106  Acute DVT Left Lower Extremity - venous duplex--+DVT left common  iliac and left external iliac - started apixaban, switched to lovenox for need for LP - per pharmacy will need to hold lovenox for 24 hours prior and 4 hour after of LP.  DM2 - A1C 4.6 - allow liberal control - no need for SSI  Hyperlipidemia - continue statin  Dysphagia -  evaluated by speech-->dys 2 with thin liquids  Diarrhea  diarrhea frequency reducing. Negative C. Difficile, use imodium  Bowel regimen: last BM 09/10/2016 Diet: dys 1 diet DVT Prophylaxis: on therapeutic anticoagulation.  Advance goals of care discussion: DNR DNI  Family Communication: no family was present at bedside, at the time of interview.  Discussed with daughter on the phone on 09/09/2016, she request to continue current plan of care.  Disposition:  Discharge to SNF. Expected discharge date: 09/12/2016, pending improvement in mentation and oral intake  Consultants: oncology, neurology Procedures: LP  Antibiotics: Anti-infectives    Start     Dose/Rate Route Frequency Ordered Stop   08/30/16 1245  amoxicillin-clavulanate (AUGMENTIN) 875-125 MG per tablet 1 tablet     1 tablet Oral Every 12 hours 08/30/16 1236 09/04/16 2142     Subjective: Waxing and waning mentation, was initially lethargic but later on was more awake.  Objective: Physical Exam: Vitals:   09/10/16 2110 09/11/16 0524 09/11/16 0650 09/11/16 1325  BP: 116/68 (!) 94/54 (!) 106/59 (!) 88/61  Pulse:  83 71 81  Resp:  18  18  Temp:  98.1 F (36.7 C)  98.4 F (36.9 C)  TempSrc:  Oral  Oral  SpO2:  98% 99% 99%  Weight:      Height:        Intake/Output Summary (Last 24 hours) at 09/11/16 1650 Last data filed at 09/11/16 0600  Gross per 24 hour  Intake             1525 ml  Output              750 ml  Net              775 ml   Filed Weights   08/29/16 0041  Weight: 51.8 kg (114 lb 3.2 oz)    General: Alert and awake 1 oriented, More lethargic. Appear in mild distress, affect appropriate Eyes: PERRL, Conjunctiva normal ENT: Oral Mucosa clear moist. Neck: difficult to assess JVD, no Abnormal Mass Or lumps Cardiovascular: S1 and S2 Present, no Murmur, Respiratory: Bilateral Air entry equal and Decreased, no use of accessory muscle, Clear to Auscultation, no Crackles, no wheezes Abdomen:  Bowel Sound present, Soft and no tenderness Skin: no redness, no Rash, no induration Extremities: right upper extremity edema, trace Pedal edema, no calf tenderness  Data Reviewed: CBC:  Recent Labs Lab 09/06/16 0401 09/07/16 0405 09/08/16 0418 09/10/16 0528  WBC 5.9 5.6 4.7 4.1  NEUTROABS  --   --  3.3 2.7  HGB 12.1 11.3* 10.5* 9.9*  HCT 36.1 34.0* 31.5* 30.2*  MCV 88.5 88.5 88.7 91.5  PLT 164 147* 104* 85*   Basic Metabolic Panel:  Recent Labs Lab 09/06/16 0401 09/07/16 0405 09/08/16 0418 09/09/16 1032 09/10/16 0528 09/11/16 1205  NA 140 141 142 145 143 139  K 3.2* 3.1* 2.7* 3.7 3.1* 3.0*  CL 113* 110 111 114* 115* 114*  CO2 21* 24 25 26 25 22   GLUCOSE 119* 113* 107* 107* 87 137*  BUN 17 19 18 15 12 7   CREATININE 0.49 0.52 0.48 0.43* 0.41* 0.40*  CALCIUM 8.5* 8.7* 8.1* 8.2*  7.5* 7.6*  MG 1.8  --  1.7 1.7 1.9 1.7  PHOS  --   --  2.1* 2.4* 2.5  --     CBG:  Recent Labs Lab 09/05/16 2203 09/07/16 0851  GLUCAP 125* 111*   Studies: Dg Fluoro Guide Lumbar Puncture  Result Date: 09/11/2016 CLINICAL DATA:  Encephalopathy. Previous unsuccessful lumbar puncture. EXAM: DIAGNOSTIC LUMBAR PUNCTURE UNDER FLUOROSCOPIC GUIDANCE FLUOROSCOPY TIME:  Fluoroscopy Time:  1 minutes 36 second Radiation Exposure Index (if provided by the fluoroscopic device): Number of Acquired Spot Images: 0 PROCEDURE: The patient was not able to give consent. Consent was obtained by telephone from the patient's daughter with witness by Coralee Pesa. The patient was placed prone on the fluoroscopic table. L2-3 was chosen for LP based on prior abdominal MRI review. Skin prepped with Betadine. Local anesthesia 1% lidocaine. 20 gauge spinal needle was placed into the subarachnoid space at L1-2. There was return of blood-tinged CSF with low CSF pressure. Opening pressure approximately 12 cm H2O. With the head of the table elevated, there was return of blood-tinged CSF which cleared on serial tubes. Total of  8 mL of CSF were obtained for laboratory studies. No apparent complications. IMPRESSION: Successful lumbar puncture using fluoroscopic guidance. Blood-tinged CSF cleared over time suggesting traumatic tap. Electronically Signed   By: Franchot Gallo M.D.   On: 09/11/2016 11:29     Scheduled Meds: . chlorhexidine  15 mL Mouth Rinse BID  . feeding supplement (ENSURE ENLIVE)  237 mL Oral TID BM  . folic acid  1 mg Oral Daily  . levothyroxine  175 mcg Oral QAC breakfast  . mouth rinse  15 mL Mouth Rinse q12n4p  . megestrol  400 mg Oral Daily  . methylPREDNISolone (SOLU-MEDROL) injection  1,000 mg Intravenous Daily  . modafinil  100 mg Oral Daily  . potassium chloride  40 mEq Oral BID   Continuous Infusions: . dextrose 5 % and 0.45 % NaCl with KCl 20 mEq/L 100 mL/hr at 09/11/16 1517   PRN Meds: acetaminophen **OR** acetaminophen, docusate sodium, lip balm, loperamide, lubiprostone, magic mouthwash, ondansetron **OR** ondansetron (ZOFRAN) IV, sodium chloride flush  Time spent: 30 minutes  Author: Berle Mull, MD [facility] Pager: [phone] 09/11/2016 4:50 PM  If 7PM-7AM, please contact night-coverage at [email or web address], password [facility]

## 2016-09-12 LAB — BASIC METABOLIC PANEL
Anion gap: 7 (ref 5–15)
BUN: 6 mg/dL (ref 6–20)
CHLORIDE: 110 mmol/L (ref 101–111)
CO2: 21 mmol/L — ABNORMAL LOW (ref 22–32)
CREATININE: 0.38 mg/dL — AB (ref 0.44–1.00)
Calcium: 7.7 mg/dL — ABNORMAL LOW (ref 8.9–10.3)
Glucose, Bld: 201 mg/dL — ABNORMAL HIGH (ref 65–99)
Potassium: 3.5 mmol/L (ref 3.5–5.1)
SODIUM: 138 mmol/L (ref 135–145)

## 2016-09-12 LAB — MAGNESIUM: MAGNESIUM: 1.6 mg/dL — AB (ref 1.7–2.4)

## 2016-09-12 MED ORDER — MAGNESIUM SULFATE 2 GM/50ML IV SOLN
2.0000 g | Freq: Once | INTRAVENOUS | Status: AC
  Administered 2016-09-12: 2 g via INTRAVENOUS
  Filled 2016-09-12: qty 50

## 2016-09-12 NOTE — Progress Notes (Signed)
Subjective: Asleep when I go by.   Exam: Vitals:   09/11/16 2110 09/12/16 0446  BP: 112/64 (!) 98/41  Pulse: (!) 45 81  Resp: 19 20  Temp: 98 F (36.7 C) 98.1 F (36.7 C)    Extremities- no joint deformities, effusion, or inflammation Lymph-no adenopathy palpable Musculoskeletal-no joint tenderness, deformity or swelling Skin-warm and dry, no hyperpigmentation, vitiligo, or suspicious lesions    Gen: In bed, NAD MS: Patient is asleep, but does awaken, though remains groggy. She is able to tell me her name, but not where she is or month.  CN: EOMs intact,  face is symmetric Motor: f/c x 4.  Sensory: Intact to light touch  Protei, glucose normal. No xanthrochromia so suspect RBC are due to tap.   Impression: 76 year old female with waxing/waning lethargy most consistent with delirium of unclear cause. The acuity of the change is unusual given that she was copmpletely independent prio rot this hospitalization.   I think that with a sudden mental status change in a previously healthy adult with cancer, autoimmune causes should be considered prior to moving to palliation. I do not have strong suspicion for this, but certainly can not exclude it. I have sent a paraneoplastic panel on her CSF and in addition, given how long it will take to come back, started steroids. I had hoped to get cytology before doing this, but given that it will likely be Monday before we get results there, I would favor not delaying and going ahead and trying IV steroids.   Recommendations: 1) IV solumedrol 1g daily x 3-5 days. 2) PAC1 panel to [facility] pending(CSF paraneoplastic) 3) Awaiting cytology 4) will follow.    Roland Rack, MD [facility] [phone]  If 7pm- 7am, please page neurology on call as listed in [place].  09/12/2016, 10:39 AM

## 2016-09-12 NOTE — Progress Notes (Signed)
[facility] Progress Note  Patient: Christy Harrell [id number]   PCP: [name], MD DOB: 08/20/1940   DOA: 08/28/2016   DOS: 09/12/2016   Date of Service: the patient was seen and examined on 09/12/2016  Brief hospital course: Pt. with PMH of metastatic breast cancer, hypertension, diabetes mellitus, hyperlipidemia presenting with altered mental status. The patient is poor historian. Much of the history is provided through the patient's daughter who lives in [place]. Apparently, the patient's daughter noted that the patient was sleepy and repeating herself on the phone on the evening of 08/26/2016. On the morning of 08/27/2016, the patient's cousin was unable to reach the patient, and so the neighbor to check up on the patient. The patient was found sitting on the commode and unable to get up and walk. EMS was called. She was noted to be hypotensive at that time and given 500 mL bolus of fluid. The patient refused transfer to the hospital. On the morning of 08/28/2016, the patient's cousin went to check up on the patient again, and the patient was noted not to have moved from the chair in the denfrom the previous day. patient has had a gradual functional decline since August 2017. After initial workup being negative, Pt was transferred from [facility] to [facility] on 1/21 for further neurological work up. EEG was done and showed no evidence of epileptiform activity.   Currently further plan is Further workup of acute encephalopathy.  Assessment and Plan: Acute metabolic encephalopathy - Dehydration and volume depletion contributing to her encephalopathy, ? Paraneoplastic syndrome  - TSH 2.106 - Ammonia 26 - serum B12--1645 - RPR--neg - MRI of the brain with contrast and CT head both neg for metastasis. - Urinalysis negative for pyuria - neurology team consulted, EEG done but not revealing etiology of the encephalopathy  - discussed with Dr. [name]. - No sedative medications, no evidence  of infection or meningeal signs. - ABG shows hyperventilation. Mild hypoxia.  - Slowly getting better this morning, although still not at her baseline,  - Appreciate neurology input, unable to perform lumbar puncture twice. One at bedside as well as 1 by IR.  - Dry tap, repeat LP performed on 09/11/2016. - Attempting Provigil trial and as a last resort for improvement in patient's mentation. 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 - I discussed in detail regarding patient's current condition with her daughter who is the power of attorney. If patient does not show any meaningful improvement despite being on high-dose steroids I recommended option for comfort care. She has asked questions regarding the use of a feeding tube, and I recommended that since her mentation is not getting better a feeding tube would not necessarily provide any quality of life for the patient and will be more harm than benefit, the daughter agrees with that and currently holding off on any feeding tube.  Failure to thrive, severe PCM  - consulted nutrition, appreciate assistance  - Poor oral intake actually none.  - Add megace.  Metastatic breast cancer - Initially diagnosed in March 2012 - Metastasis noted initially May 2014--metastasis to bone and liver - Has had history of pathologic fracture to the humerus - has been receiving eribulin, last does 07/23/2017 - appreciate Dr. [name] following   Osteonecrosis of the jaw - per Dr. Karmen Bongo, Bone was biopsied and sent to [facility] with result read as osteonecrosis of the jaw, no cancer or evidence of infection noted - 08/28/2016 CT neck  right mandible with large bony sequestrum - started Amox/Clav that was ordered by her oral surgeon but pt did not fill Rx, completed treatment here in the hospital  Hypokalemia Replace and Monitor Add to IV fluids, increase oral supplement from 20 to 40  bid.  Thrombocytopenia, pancytopenia  - possibly due to acute DVT, recent worsening is due to dilution from fluids, will monitor  - currently no signs of bleeding  - Serum B12--1645 - TSH 2.106  Acute DVT Left Lower Extremity - venous duplex--+DVT left common iliac and left external iliac - started apixaban, switched to lovenox for need for LP, currently continuing Lovenox due to patient's minimal oral intake status.  DM2 - A1C 4.6 - allow liberal control - no need for SSI  Hyperlipidemia - continue statin  Dysphagia - evaluated by speech-->dys 1 with thin liquids  Diarrhea  diarrhea frequency reducing. Negative C. Difficile, use imodium  Bowel regimen: last BM 09/10/2016 Diet: dys 1 diet DVT Prophylaxis: on therapeutic anticoagulation.  Advance goals of care discussion: DNR DNI  Family Communication: no family was present at bedside, at the time of interview.  Discussed with daughter on the phone on 09/12/2016 she request to continue current plan of care. Recommended if the patient does not show any meaningful improvement and if the cytology is negative comfort care would be best course of action for the patient.  Disposition:  Discharge to SNF. Expected discharge date: 09/15/2016, pending improvement in mentation and oral intake as well as further goals of care discussion  Consultants: oncology, neurology Procedures: LP  Antibiotics: Anti-infectives    Start     Dose/Rate Route Frequency Ordered Stop   08/30/16 1245  amoxicillin-clavulanate (AUGMENTIN) 875-125 MG per tablet 1 tablet     1 tablet Oral Every 12 hours 08/30/16 1236 09/04/16 2142     Subjective: Remains lethargic throughout today.  Objective: Physical Exam: Vitals:   09/11/16 0650 09/11/16 1325 09/11/16 2110 09/12/16 0446  BP: (!) 106/59 (!) 88/61 112/64 (!) 98/41  Pulse: 71 81 (!) 45 81  Resp:  18 19 20   Temp:  98.4 F (36.9 C) 98 F (36.7 C) 98.1 F (36.7 C)  TempSrc:  Oral Oral    SpO2: 99% 99% 99% 97%  Weight:      Height:        Intake/Output Summary (Last 24 hours) at 09/12/16 1634 Last data filed at 09/12/16 1503  Gross per 24 hour  Intake             3321 ml  Output             1300 ml  Net             2021 ml   Filed Weights   08/29/16 0041  Weight: 51.8 kg (114 lb 3.2 oz)    General: Drowsy, More lethargic. Appear in mild distress, Eyes: PERRL, Conjunctiva normal ENT: Oral Mucosa clear moist. Neck: difficult to assess JVD, no Abnormal Mass Or lumps Cardiovascular: S1 and S2 Present, no Murmur, Respiratory: Bilateral Air entry equal and Decreased, no use of accessory muscle, Clear to Auscultation, no Crackles, no wheezes Abdomen: Bowel Sound present, Soft and no tenderness Skin: no redness, no Rash, no induration Extremities: right upper extremity edema, trace Pedal edema, no calf tenderness  Data Reviewed: CBC:  Recent Labs Lab 09/06/16 0401 09/07/16 0405 09/08/16 0418 09/10/16 0528  WBC 5.9 5.6 4.7 4.1  NEUTROABS  --   --  3.3 2.7  HGB 12.1  11.3* 10.5* 9.9*  HCT 36.1 34.0* 31.5* 30.2*  MCV 88.5 88.5 88.7 91.5  PLT 164 147* 104* 85*   Basic Metabolic Panel:  Recent Labs Lab 09/08/16 0418 09/09/16 1032 09/10/16 0528 09/11/16 1205 09/12/16 0441  NA 142 145 143 139 138  K 2.7* 3.7 3.1* 3.0* 3.5  CL 111 114* 115* 114* 110  CO2 25 26 25 22  21*  GLUCOSE 107* 107* 87 137* 201*  BUN 18 15 12 7 6   CREATININE 0.48 0.43* 0.41* 0.40* 0.38*  CALCIUM 8.1* 8.2* 7.5* 7.6* 7.7*  MG 1.7 1.7 1.9 1.7 1.6*  PHOS 2.1* 2.4* 2.5  --   --     CBG:  Recent Labs Lab 09/05/16 2203 09/07/16 0851  GLUCAP 125* 111*   Studies: No results found.   Scheduled Meds: . chlorhexidine  15 mL Mouth Rinse BID  . enoxaparin (LOVENOX) injection  1.5 mg/kg Subcutaneous Q24H  . feeding supplement (ENSURE ENLIVE)  237 mL Oral TID BM  . folic acid  1 mg Oral Daily  . levothyroxine  175 mcg Oral QAC breakfast  . mouth rinse  15 mL Mouth Rinse q12n4p   . megestrol  400 mg Oral Daily  . methylPREDNISolone (SOLU-MEDROL) injection  1,000 mg Intravenous Daily  . modafinil  100 mg Oral Daily  . potassium chloride  40 mEq Oral BID   Continuous Infusions: . dextrose 5 % and 0.45 % NaCl with KCl 20 mEq/L 100 mL/hr at 09/12/16 0126   PRN Meds: acetaminophen **OR** acetaminophen, docusate sodium, lip balm, loperamide, lubiprostone, magic mouthwash, ondansetron **OR** ondansetron (ZOFRAN) IV, sodium chloride flush  Time spent: 30 minutes  Author: Berle Mull, MD [facility] Pager: [phone] 09/12/2016 4:34 PM  If 7PM-7AM, please contact night-coverage at [email or web address], password [facility]

## 2016-09-13 DIAGNOSIS — R400 Somnolence: Secondary | ICD-10-CM

## 2016-09-13 LAB — BASIC METABOLIC PANEL
ANION GAP: 4 — AB (ref 5–15)
ANION GAP: 8 (ref 5–15)
BUN: 6 mg/dL (ref 6–20)
BUN: 6 mg/dL (ref 6–20)
CHLORIDE: 109 mmol/L (ref 101–111)
CO2: 24 mmol/L (ref 22–32)
CO2: 22 mmol/L (ref 22–32)
CREATININE: 0.44 mg/dL (ref 0.44–1.00)
Calcium: 7.7 mg/dL — ABNORMAL LOW (ref 8.9–10.3)
Calcium: 7.6 mg/dL — ABNORMAL LOW (ref 8.9–10.3)
Chloride: 112 mmol/L — ABNORMAL HIGH (ref 101–111)
Creatinine, Ser: 0.53 mg/dL (ref 0.44–1.00)
GFR calc non Af Amer: >60 mL/min (ref >60)
GFR calc non Af Amer: >60 mL/min (ref >60)
GLUCOSE: 186 mg/dL — AB (ref 65–99)
Glucose, Bld: 160 mg/dL — ABNORMAL HIGH (ref 65–99)
POTASSIUM: 2.8 mmol/L — AB (ref 3.5–5.1)
POTASSIUM: 3.2 mmol/L — AB (ref 3.5–5.1)
SODIUM: 139 mmol/L (ref 135–145)
Sodium: 140 mmol/L (ref 135–145)

## 2016-09-13 LAB — MAGNESIUM: Magnesium: 2.1 mg/dL (ref 1.7–2.4)

## 2016-09-13 MED ORDER — KCL IN DEXTROSE-NACL 40-5-0.45 MEQ/L-%-% IV SOLN
INTRAVENOUS | Status: DC
  Administered 2016-09-13 – 2016-09-14 (×3): via INTRAVENOUS
  Administered 2016-09-15: 75 mL/h via INTRAVENOUS
  Administered 2016-09-16: 08:00:00 via INTRAVENOUS
  Filled 2016-09-13 (×6): qty 1000

## 2016-09-13 MED ORDER — SODIUM CHLORIDE 0.9 % IV SOLN
1000.0000 mg | Freq: Every day | INTRAVENOUS | Status: AC
  Administered 2016-09-14 – 2016-09-15 (×2): 1000 mg via INTRAVENOUS
  Filled 2016-09-13 (×3): qty 8

## 2016-09-13 NOTE — Progress Notes (Signed)
Subjective: Patient sleeping all entering the room. Upon talking to her she quickly awakens and asked me how I'm doing. Has no complaints.  Exam: Vitals:   09/12/16 2045 09/13/16 0505  BP: (!) 98/44 (!) 105/56  Pulse: 79 72  Resp: 20 19  Temp: 97.6 F (36.4 C) 98.2 F (36.8 C)        Gen: In bed, NAD MS: Patient is able to tell me her name, all commands, she asks me to put the cup that she's been drinking onto the table so she can get it later. CN: EOM intact, face symmetric, Motor: Moving all extremities well antigravity Sensory: Intact to light touch throughout   Pertinent Labs/Diagnostics: None  [name] PA-C [facility] [phone]  She is much better than I hav eseen her thus far, still not completely oriented, but clearly different than previous days to me.   Impression: 76 year old female with waxing and waning lethargy consistent with delirium of unclear cause. The acuity of the change is unusual given that she was copmpletely independent prio rot this hospitalization.   I think that with a sudden mental status change in a previously healthy adult with cancer, autoimmune causes should be considered prior to moving to palliation. I do not have strong suspicion for this, but certainly can not exclude it. I have sent a paraneoplastic panel on her CSF and in addition, given how long it will take to come back, started steroids. I had hoped to get cytology before doing this, but given that it will likely be Monday before we get results there, I would favor not delaying and going ahead and trying IV steroids.    Awaiting mayo  PAC 1 panel.  I am not certain if this represents true improvemetn vs just me cathcing her at a good point in her waxing/waning symptoms but seems to be significantly better to me. I think that this is enough to suggest completing 5 day course of solumedrol..  Recommendations: 1) continue solumedrol for 5 day course, today is day 3.   2) will follow.    Roland Rack, MD [facility] [phone]  If 7pm- 7am, please page neurology on call as listed in [place].  09/13/2016, 9:56 AM

## 2016-09-13 NOTE — Progress Notes (Signed)
[facility] Progress Note  Patient: Christy Harrell [id number]   PCP: [name], MD DOB: 05/24/1941   DOA: 08/28/2016   DOS: 09/13/2016   Date of Service: the patient was seen and examined on 09/13/2016  Brief hospital course: Pt. with PMH of metastatic breast cancer, hypertension, diabetes mellitus, hyperlipidemia presenting with altered mental status. The patient is poor historian. Much of the history is provided through the patient's daughter who lives in [place]. Apparently, the patient's daughter noted that the patient was sleepy and repeating herself on the phone on the evening of 08/26/2016. On the morning of 08/27/2016, the patient's cousin was unable to reach the patient, and so the neighbor to check up on the patient. The patient was found sitting on the commode and unable to get up and walk. EMS was called. She was noted to be hypotensive at that time and given 500 mL bolus of fluid. The patient refused transfer to the hospital. On the morning of 08/28/2016, the patient's cousin went to check up on the patient again, and the patient was noted not to have moved from the chair in the denfrom the previous day. patient has had a gradual functional decline since August 2017. After initial workup being negative, Pt was transferred from [facility] to [facility] on 1/21 for further neurological work up. EEG was done and showed no evidence of epileptiform activity.   Currently further plan is Further workup of acute encephalopathy.  Assessment and Plan: Acute metabolic encephalopathy - Dehydration and volume depletion contributing to her encephalopathy, ? Paraneoplastic syndrome  - TSH 2.106 - Ammonia 26 - serum B12--1645 - RPR--neg - MRI of the brain with contrast and CT head both neg for metastasis. - Urinalysis negative for pyuria - neurology team consulted, EEG done but not revealing etiology of the encephalopathy  - discussed with Dr. [name]. - No sedative medications, no evidence  of infection or meningeal signs. - ABG shows hyperventilation. Mild hypoxia.  - Slowly getting better this morning, although still not at her baseline,  - Appreciate neurology input, unable to perform lumbar puncture twice. One at bedside as well as 1 by IR.  - Dry tap, repeat LP performed on 09/11/2016. - Attempting Provigil trial and as a last resort for improvement in patient's mentation. 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 Day 3 of 5 - Patient is showing somewhat improvement in her mentation although remains confused but is able to carry out meaningful conversation. - I discussed in detail regarding patient's current condition with her daughter who is the power of attorney. If patient does not show any meaningful improvement despite being on high-dose steroids I recommended option for comfort care. She has asked questions regarding the use of a feeding tube, and I recommended that since her mentation is not getting better a feeding tube would not necessarily provide any quality of life for the patient and will be more harm than benefit, the daughter agrees with that and currently holding off on any feeding tube.  Failure to thrive, severe PCM  - consulted nutrition, appreciate assistance  - Poor oral intake actually none.  - Add megace.  Metastatic breast cancer - Initially diagnosed in March 2012 - Metastasis noted initially May 2014--metastasis to bone and liver - Has had history of pathologic fracture to the humerus - has been receiving eribulin, last does 07/23/2017 - appreciate Dr. [name] following   Osteonecrosis of the jaw - per Dr. Karmen Bongo, Bone was  biopsied and sent to [facility] with result read as osteonecrosis of the jaw, no cancer or evidence of infection noted - 08/28/2016 CT neck right mandible with large bony sequestrum - started Amox/Clav that was ordered by her oral surgeon but pt did not fill Rx, completed  treatment here in the hospital  Hypokalemia Replace and Monitor Add to IV fluids, increase oral supplement from 20 to 40 bid.  Thrombocytopenia, pancytopenia  - possibly due to acute DVT, recent worsening is due to dilution from fluids, will monitor  - currently no signs of bleeding  - Serum B12--1645 - TSH 2.106  Acute DVT Left Lower Extremity - venous duplex--+DVT left common iliac and left external iliac - started apixaban, switched to lovenox for need for LP, currently continuing Lovenox due to patient's minimal oral intake status.  DM2 - A1C 4.6 - allow liberal control - no need for SSI  Hyperlipidemia - continue statin  Dysphagia - evaluated by speech-->dys 1 with thin liquids  Diarrhea  diarrhea frequency reducing. Negative C. Difficile, use imodium  Bowel regimen: last BM 09/10/2016 Diet: dys 1 diet DVT Prophylaxis: on therapeutic anticoagulation.  Advance goals of care discussion: DNR DNI  Family Communication: no family was present at bedside, at the time of interview.  Discussed with daughter on the phone on 09/12/2016 she request to continue current plan of care. Recommended if the patient does not show any meaningful improvement and if the cytology is negative comfort care would be best course of action for the patient.  Disposition:  Discharge to SNF. Expected discharge date: 09/15/2016, pending improvement in mentation and oral intake as well as further goals of care discussion  Consultants: oncology, neurology Procedures: LP  Antibiotics: Anti-infectives    Start     Dose/Rate Route Frequency Ordered Stop   08/30/16 1245  amoxicillin-clavulanate (AUGMENTIN) 875-125 MG per tablet 1 tablet     1 tablet Oral Every 12 hours 08/30/16 1236 09/04/16 2142     Subjective: More awake, oriented to self, is able to follow command and carry out long conversation  Objective: Physical Exam: Vitals:   09/11/16 2110 09/12/16 0446 09/12/16 2045 09/13/16  0505  BP: 112/64 (!) 98/41 (!) 98/44 (!) 105/56  Pulse: (!) 45 81 79 72  Resp: 19 20 20 19   Temp: 98 F (36.7 C) 98.1 F (36.7 C) 97.6 F (36.4 C) 98.2 F (36.8 C)  TempSrc: Oral   Oral  SpO2: 99% 97% 100% 98%  Weight:      Height:        Intake/Output Summary (Last 24 hours) at 09/13/16 1405 Last data filed at 09/13/16 0600  Gross per 24 hour  Intake              963 ml  Output             1000 ml  Net              -37 ml   Filed Weights   08/29/16 0041  Weight: 51.8 kg (114 lb 3.2 oz)    General: More awake, oriented to self, is able to follow command and carry out long conversation Eyes: PERRL, Conjunctiva normal ENT: Oral Mucosa clear moist. Cardiovascular: S1 and S2 Present, no Murmur, Respiratory: Bilateral Air entry equal and Decreased, no use of accessory muscle, Clear to Auscultation, no Crackles, no wheezes Abdomen: Bowel Sound present, Soft and no tenderness Skin: no redness, no Rash, no induration Extremities: right upper extremity edema, trace Pedal edema,  no calf tenderness  Data Reviewed: CBC:  Recent Labs Lab 09/07/16 0405 09/08/16 0418 09/10/16 0528  WBC 5.6 4.7 4.1  NEUTROABS  --  3.3 2.7  HGB 11.3* 10.5* 9.9*  HCT 34.0* 31.5* 30.2*  MCV 88.5 88.7 91.5  PLT 147* 104* 85*   Basic Metabolic Panel:  Recent Labs Lab 09/08/16 0418 09/09/16 1032 09/10/16 0528 09/11/16 1205 09/12/16 0441 09/13/16 0500  NA 142 145 143 139 138 140  K 2.7* 3.7 3.1* 3.0* 3.5 2.8*  CL 111 114* 115* 114* 110 112*  CO2 25 26 25 22  21* 24  GLUCOSE 107* 107* 87 137* 201* 186*  BUN 18 15 12 7 6 6   CREATININE 0.48 0.43* 0.41* 0.40* 0.38* 0.53  CALCIUM 8.1* 8.2* 7.5* 7.6* 7.7* 7.7*  MG 1.7 1.7 1.9 1.7 1.6* 2.1  PHOS 2.1* 2.4* 2.5  --   --   --     CBG:  Recent Labs Lab 09/07/16 0851  GLUCAP 111*   Studies: No results found.   Scheduled Meds: . chlorhexidine  15 mL Mouth Rinse BID  . enoxaparin (LOVENOX) injection  1.5 mg/kg Subcutaneous Q24H  .  feeding supplement (ENSURE ENLIVE)  237 mL Oral TID BM  . folic acid  1 mg Oral Daily  . levothyroxine  175 mcg Oral QAC breakfast  . mouth rinse  15 mL Mouth Rinse q12n4p  . megestrol  400 mg Oral Daily  . [START ON 09/14/2016] methylPREDNISolone (SOLU-MEDROL) injection  1,000 mg Intravenous Daily  . modafinil  100 mg Oral Daily  . potassium chloride  40 mEq Oral BID   Continuous Infusions: . dextrose 5 % and 0.45 % NaCl with KCl 40 mEq/L 75 mL/hr at 09/13/16 0825   PRN Meds: acetaminophen **OR** acetaminophen, docusate sodium, lip balm, loperamide, lubiprostone, magic mouthwash, ondansetron **OR** ondansetron (ZOFRAN) IV, sodium chloride flush  Time spent: 30 minutes  Author: Berle Mull, MD [facility] Pager: [phone] 09/13/2016 2:05 PM  If 7PM-7AM, please contact night-coverage at [email or web address], password [facility]

## 2016-09-14 LAB — IGG CSF INDEX
Albumin CSF-mCnc: 9 mg/dL — ABNORMAL LOW (ref 11–48)
Albumin: 2.3 g/dL — ABNORMAL LOW (ref 3.5–4.8)
CSF IgG Index: 0.6 (ref 0.0–0.7)
IGG (IMMUNOGLOBIN G), SERUM: 798 mg/dL (ref 700–1600)
IgG, CSF: 1.9 mg/dL (ref 0.0–8.6)
IgG/Alb Ratio, CSF: 0.21 (ref 0.00–0.25)

## 2016-09-14 LAB — BASIC METABOLIC PANEL
Anion gap: 4 — ABNORMAL LOW (ref 5–15)
BUN: 7 mg/dL (ref 6–20)
CHLORIDE: 111 mmol/L (ref 101–111)
CO2: 24 mmol/L (ref 22–32)
CREATININE: 0.41 mg/dL — AB (ref 0.44–1.00)
Calcium: 7.5 mg/dL — ABNORMAL LOW (ref 8.9–10.3)
GFR calc Af Amer: >60 mL/min (ref >60)
GFR calc non Af Amer: >60 mL/min (ref >60)
GLUCOSE: 169 mg/dL — AB (ref 65–99)
Potassium: 3.6 mmol/L (ref 3.5–5.1)
SODIUM: 139 mmol/L (ref 135–145)

## 2016-09-14 LAB — MAGNESIUM: MAGNESIUM: 1.9 mg/dL (ref 1.7–2.4)

## 2016-09-14 LAB — CSF CULTURE: CULTURE: NO GROWTH

## 2016-09-14 LAB — VDRL, CSF: SYPHILIS VDRL QUANT CSF: NONREACTIVE

## 2016-09-14 LAB — CSF CULTURE W GRAM STAIN

## 2016-09-14 NOTE — Care Management Important Message (Signed)
Important Message  Patient Details  Name: Christy Harrell MRN: [id number] Date of Birth: [birth year]   Medicare Important Message Given:  Yes    [name] [address] 09/14/2016, 4:43 PM

## 2016-09-14 NOTE — Progress Notes (Signed)
Speech Language Pathology Treatment: Dysphagia  Patient Details Name: Christy Harrell MRN: [id number] DOB: 1941-05-31 Today's Date: 09/14/2016 Time: 8873-7308 SLP Time Calculation (min) (ACUTE ONLY): 12 min  Assessment / Plan / Recommendation Clinical Impression  Pt demonstrating intermittent moaning like sounds however denies pain. Dementia-like oral movements noted at rest (without po's in oral cavity). Attempted Dys 2 texture for possible upgrade with prolonged mastication. No s/s aspiration. Pt has met goals. Recommend continue Dys 1 (pt states she "does not mind") and thin, crush meds and check for oral pocketing. No further ST needed.     HPI HPI: 76 year old female with a history of metastatic breast cancer, hypertension, diabetes mellitus, hyperlipidemia presenting with altered mental status. Pt also with osteonecrosis of the jaw with recent dental extraction and bone biopsy. CT Neck 1/19 showed large bony sequestrum.      SLP Plan  All goals met;Discharge SLP treatment due to (comment)     Recommendations  Diet recommendations: Dysphagia 1 (puree);Thin liquid Liquids provided via: Straw;Cup Medication Administration: Whole meds with puree Supervision: Patient able to self feed;Full supervision/cueing for compensatory strategies;Staff to assist with self feeding Compensations: Slow rate;Small sips/bites;Minimize environmental distractions Postural Changes and/or Swallow Maneuvers: Seated upright 90 degrees                Oral Care Recommendations: Oral care BID Follow up Recommendations: 24 hour supervision/assistance;Skilled Nursing facility Plan: All goals met;Discharge SLP treatment due to (comment)       GO                [name] 09/14/2016, 10:21 AM  [name] Caroli.Ed [employer] [phone]

## 2016-09-14 NOTE — Progress Notes (Signed)
ANTICOAGULATION CONSULT NOTE - Follow Up Consult  Pharmacy Consult for Lovenox while off Eliquis Indication: new LLE DVT  Allergies  Allergen Reactions  . Bydureon [Exenatide] Rash  . Gadolinium Derivatives Other (See Comments)    Pt began having chest numbness/tingling , stated her heart felt funny, had to take her to ED for EKG per RN   CAP  . Enalapril Rash    Patient Measurements: Height: 5' (152.4 cm) Weight: 114 lb 3.2 oz (51.8 kg) IBW/kg (Calculated) : 45.5  Vital Signs: Temp: 98.6 F (37 C) (02/05 0552) Temp Source: Oral (02/05 0552) BP: 108/61 (02/05 0552) Pulse Rate: 76 (02/05 0552)  Labs:  Recent Labs  09/13/16 0500 09/13/16 1545 09/14/16 0500  CREATININE 0.53 0.44 0.41*    Estimated Creatinine Clearance: 43.6 mL/min (by C-G formula based on SCr of 0.41 mg/dL (L)).   Assessment: 76yo female on Lovenox 1.5mg /kg SQ q24 bridge while off Eliquis for new LLE DVT.   SCr stable.  Hgb 9.9 on 2/1 and pltc 85K  (significant variability since 1/19, plct were 86K on 1/23 and up to 164 on 1/28).  Currently continuing Lovenox due to patient's minimal oral intake status. No current bleeding noted.  Goal of Therapy:  Anti-Xa level 0.6-1 units/ml 4hrs after LMWH dose given Monitor platelets by anticoagulation protocol: Yes   Plan:  Continue Lovenox 1.5 mg/kg =80 mg SQ q24h today. Monitor for signs and symptoms of bleeding, renal function. F/u CBC 09/15/16   Thank you for allowing [place] to participate in this patients care.  Christy Harrell, PharmD Clinical phone for 09/14/2016 from 7a-3:30p: [phone] If after 3:30p, please call main pharmacy at: [phone] 09/14/2016 11:26 AM

## 2016-09-14 NOTE — Progress Notes (Signed)
[facility] Progress Note  Patient: Christy Harrell [id number]   PCP: [name], MD DOB: 1941-05-08   DOA: 08/28/2016   DOS: 09/14/2016   Date of Service: the patient was seen and examined on 09/14/2016  Brief hospital course: Pt. with PMH of metastatic breast cancer, hypertension, diabetes mellitus, hyperlipidemia presenting with altered mental status. The patient is poor historian. Much of the history is provided through the patient's daughter who lives in [place]. Apparently, the patient's daughter noted that the patient was sleepy and repeating herself on the phone on the evening of 08/26/2016. On the morning of 08/27/2016, the patient's cousin was unable to reach the patient, and so the neighbor to check up on the patient. The patient was found sitting on the commode and unable to get up and walk. EMS was called. She was noted to be hypotensive at that time and given 500 mL bolus of fluid. The patient refused transfer to the hospital. On the morning of 08/28/2016, the patient's cousin went to check up on the patient again, and the patient was noted not to have moved from the chair in the denfrom the previous day. patient has had a gradual functional decline since August 2017. After initial workup being negative, Pt was [facility] to [facility] on 1/21 for further neurological work up. EEG was done and showed no evidence of epileptiform activity.  LP was performed, no evidence of infection identified, cytology negative for any leptomeningeal spread, patient was started on high-dose IV Solu-Medrol, patient does show some improvement but not meaningful enough with persistent poor oral intake and lethargy. Currently further plan is Consulting palliative care for further input and goals of care discussion.  Assessment and Plan: Acute metabolic encephalopathy - Dehydration and volume depletion contributing to her encephalopathy, ? Paraneoplastic syndrome  - TSH 2.106 - Ammonia  26 - serum B12--1645 - RPR--neg - MRI of the brain with contrast and CT head both neg for metastasis. - Urinalysis negative for pyuria - neurology team consulted, EEG done but not revealing etiology of the encephalopathy  - discussed with Dr. [name]. - No sedative medications, no evidence of infection or meningeal signs. - ABG shows hyperventilation. Mild hypoxia.  - Slowly getting better this morning, although still not at her baseline,  - Appreciate neurology input, unable to perform lumbar puncture twice. One at bedside as well as 1 by IR.  - Dry tap, repeat LP performed on 09/11/2016. - Attempting Provigil trial and as a last resort for improvement in patient's mentation. 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 Day 3 of 5 - Patient is showing somewhat improvement in her mentation although remains confused but is able to carry out meaningful conversation. Continues to have poor oral intake and I'm concerned regarding patient's prognosis both long-term as well as short-term.  - I discussed in detail regarding patient's current condition with her daughter who is the power of attorney. If patient does not show any meaningful improvement despite being on high-dose steroids I recommended option for comfort care. She has asked questions regarding the use of a feeding tube, and I recommended that since her mentation is not getting better a feeding tube would not necessarily provide any quality of life for the patient and will be more harm than benefit, the daughter agrees with that and currently holding off on any feeding tube.  Failure to thrive, severe PCM  - consulted nutrition, appreciate assistance  - Poor oral intake actually  none.  - Add megace. - Palliative care consulted  Metastatic breast cancer - Initially diagnosed in March 2012 - Metastasis noted initially May 2014--metastasis to bone and liver - Has had history of  pathologic fracture to the humerus - has been receiving eribulin, last does 07/23/2017 - appreciate Dr. [name] following   Osteonecrosis of the jaw - per Dr. Karmen Bongo, Bone was biopsied and sent to [facility] with result read as osteonecrosis of the jaw, no cancer or evidence of infection noted - 08/28/2016 CT neck right mandible with large bony sequestrum - started Amox/Clav that was ordered by her oral surgeon but pt did not fill Rx, completed treatment here in the hospital  Hypokalemia Replace and Monitor Add to IV fluids, increase oral supplement from 20 to 40 bid.  Thrombocytopenia, pancytopenia  - possibly due to acute DVT, recent worsening is due to dilution from fluids, will monitor  - currently no signs of bleeding  - Serum B12--1645 - TSH 2.106  Acute DVT Left Lower Extremity - venous duplex--+DVT left common iliac and left external iliac - started apixaban, switched to lovenox for need for LP, currently continuing Lovenox due to patient's minimal oral intake status.  DM2 - A1C 4.6 - allow liberal control - no need for SSI  Hyperlipidemia - continue statin  Dysphagia - evaluated by speech-->dys 1 with thin liquids  Diarrhea  diarrhea frequency reducing. Negative C. Difficile, use imodium  Bowel regimen: last BM 09/10/2016 Diet: dys 1 diet DVT Prophylaxis: on therapeutic anticoagulation.  Advance goals of care discussion: DNR DNI  Family Communication: no family was present at bedside, at the time of interview.  Discussed with daughter on the phone on 09/13/2016 she request to continue current plan of care. Recommended if the patient does not show any meaningful improvement and if the cytology is negative comfort care would be best course of action for the patient.  Disposition:  Discharge to SNF. Expected discharge date: 09/16/2016, pending improvement in mentation and oral intake as well as further goals of care discussion  Consultants: oncology,  neurology Procedures: LP  Antibiotics: Anti-infectives    Start     Dose/Rate Route Frequency Ordered Stop   08/30/16 1245  amoxicillin-clavulanate (AUGMENTIN) 875-125 MG per tablet 1 tablet     1 tablet Oral Every 12 hours 08/30/16 1236 09/04/16 2142     Subjective: Remains awake, although waxes and wanes, oriented to self, is able to follow command and carry out long conversation  Objective: Physical Exam: Vitals:   09/13/16 1433 09/13/16 2157 09/14/16 0552 09/14/16 1443  BP: (!) 124/100 114/62 108/61 116/75  Pulse: 78 85 76 74  Resp: 16 18 19 18   Temp: 98.4 F (36.9 C) 98.2 F (36.8 C) 98.6 F (37 C) 98.9 F (37.2 C)  TempSrc: Oral Oral Oral Oral  SpO2: 100% 99% 97% 99%  Weight:      Height:        Intake/Output Summary (Last 24 hours) at 09/14/16 1714 Last data filed at 09/13/16 2157  Gross per 24 hour  Intake            787.5 ml  Output              300 ml  Net            487.5 ml   Filed Weights   08/29/16 0041  Weight: 51.8 kg (114 lb 3.2 oz)    General: awake, oriented to self, is able to follow command and  carry out long conversation Eyes: PERRL, Conjunctiva normal ENT: Oral Mucosa clear moist. Cardiovascular: S1 and S2 Present, no Murmur, Respiratory: Bilateral Air entry equal and Decreased, no use of accessory muscle, Clear to Auscultation, no Crackles, no wheezes Abdomen: Bowel Sound present, Soft and no tenderness Skin: no redness, no Rash, no induration Extremities: right upper extremity edema, trace Pedal edema, no calf tenderness  Data Reviewed: CBC:  Recent Labs Lab 09/08/16 0418 09/10/16 0528  WBC 4.7 4.1  NEUTROABS 3.3 2.7  HGB 10.5* 9.9*  HCT 31.5* 30.2*  MCV 88.7 91.5  PLT 104* 85*   Basic Metabolic Panel:  Recent Labs Lab 09/08/16 0418 09/09/16 1032 09/10/16 0528 09/11/16 1205 09/12/16 0441 09/13/16 0500 09/13/16 1545 09/14/16 0500  NA 142 145 143 139 138 140 139 139  K 2.7* 3.7 3.1* 3.0* 3.5 2.8* 3.2* 3.6  CL 111  114* 115* 114* 110 112* 109 111  CO2 25 26 25 22  21* 24 22 24   GLUCOSE 107* 107* 87 137* 201* 186* 160* 169*  BUN 18 15 12 7 6 6 6 7   CREATININE 0.48 0.43* 0.41* 0.40* 0.38* 0.53 0.44 0.41*  CALCIUM 8.1* 8.2* 7.5* 7.6* 7.7* 7.7* 7.6* 7.5*  MG 1.7 1.7 1.9 1.7 1.6* 2.1  --  1.9  PHOS 2.1* 2.4* 2.5  --   --   --   --   --     CBG: No results for input(s): GLUCAP in the last 168 hours. Studies: No results found.   Scheduled Meds: . chlorhexidine  15 mL Mouth Rinse BID  . enoxaparin (LOVENOX) injection  1.5 mg/kg Subcutaneous Q24H  . feeding supplement (ENSURE ENLIVE)  237 mL Oral TID BM  . folic acid  1 mg Oral Daily  . levothyroxine  175 mcg Oral QAC breakfast  . mouth rinse  15 mL Mouth Rinse q12n4p  . megestrol  400 mg Oral Daily  . methylPREDNISolone (SOLU-MEDROL) injection  1,000 mg Intravenous Daily  . modafinil  100 mg Oral Daily  . potassium chloride  40 mEq Oral BID   Continuous Infusions: . dextrose 5 % and 0.45 % NaCl with KCl 40 mEq/L 75 mL/hr at 09/14/16 1100   PRN Meds: acetaminophen **OR** acetaminophen, docusate sodium, lip balm, loperamide, lubiprostone, magic mouthwash, ondansetron **OR** ondansetron (ZOFRAN) IV, sodium chloride flush  Time spent: 30 minutes  Author: Berle Mull, MD [facility] Pager: [phone] 09/14/2016 5:14 PM  If 7PM-7AM, please contact night-coverage at [email or web address], password [email or web address]

## 2016-09-15 DIAGNOSIS — Z7189 Other specified counseling: Secondary | ICD-10-CM

## 2016-09-15 DIAGNOSIS — Z515 Encounter for palliative care: Secondary | ICD-10-CM

## 2016-09-15 LAB — CBC
HCT: 33.3 % — ABNORMAL LOW (ref 36.0–46.0)
Hemoglobin: 11.1 g/dL — ABNORMAL LOW (ref 12.0–15.0)
MCH: 30.2 pg (ref 26.0–34.0)
MCHC: 33.3 g/dL (ref 30.0–36.0)
MCV: 90.7 fL (ref 78.0–100.0)
PLATELETS: 103 10*3/uL — AB (ref 150–400)
RBC: 3.67 MIL/uL — ABNORMAL LOW (ref 3.87–5.11)
RDW: 19.7 % — AB (ref 11.5–15.5)
WBC: 6.3 10*3/uL (ref 4.0–10.5)

## 2016-09-15 LAB — BASIC METABOLIC PANEL
Anion gap: 8 (ref 5–15)
BUN: 7 mg/dL (ref 6–20)
CALCIUM: 7.9 mg/dL — AB (ref 8.9–10.3)
CO2: 23 mmol/L (ref 22–32)
Chloride: 107 mmol/L (ref 101–111)
Creatinine, Ser: 0.36 mg/dL — ABNORMAL LOW (ref 0.44–1.00)
GFR calc Af Amer: >60 mL/min (ref >60)
Glucose, Bld: 123 mg/dL — ABNORMAL HIGH (ref 65–99)
Potassium: 4.4 mmol/L (ref 3.5–5.1)
SODIUM: 138 mmol/L (ref 135–145)

## 2016-09-15 LAB — MAGNESIUM: MAGNESIUM: 1.9 mg/dL (ref 1.7–2.4)

## 2016-09-15 MED ORDER — ACETAMINOPHEN 325 MG PO TABS: 650.0000 mg | ORAL_TABLET | Freq: Four times a day (QID) | ORAL | Status: DC | PRN

## 2016-09-15 MED ORDER — POTASSIUM CHLORIDE CRYS ER 20 MEQ PO TBCR
40.0000 meq | EXTENDED_RELEASE_TABLET | Freq: Two times a day (BID) | ORAL | Status: DC
  Administered 2016-09-15: 40 meq via ORAL
  Filled 2016-09-15: qty 2

## 2016-09-15 MED ORDER — MEGESTROL ACETATE 40 MG PO TABS
320.0000 mg | ORAL_TABLET | Freq: Every day | ORAL | Status: DC
  Filled 2016-09-15: qty 8

## 2016-09-15 MED ORDER — ACETAMINOPHEN 160 MG/5ML PO SOLN: 650.0000 mg | Freq: Four times a day (QID) | ORAL | Status: DC

## 2016-09-15 MED ORDER — LEVOTHYROXINE SODIUM 100 MCG IV SOLR: 67.5000 ug | Freq: Every day | INTRAVENOUS | Status: DC

## 2016-09-15 MED ORDER — LEVOTHYROXINE SODIUM 75 MCG PO TABS
175.0000 ug | ORAL_TABLET | Freq: Every day | ORAL | Status: DC
  Administered 2016-09-16 – 2016-09-18 (×3): 175 ug via ORAL
  Filled 2016-09-15 (×3): qty 1

## 2016-09-15 NOTE — Progress Notes (Signed)
Physical Therapy Treatment Patient Details Name: Christy Harrell MRN: [id number] DOB: 04/18/1941 Today's Date: 09/15/2016    History of Present Illness Christy Harrell is a 76 y.o. female with medical history significant of hypothyroidism, metastatic breat cancer undergoing chemotherapy, HTN, HLD, and DM presenting with AMS. Positive for extensive acute deep vein thrombosis involving the left common and external iliac veins,  common femoral vein, femoral vein, popliteal vein, gastro veins and calf veins.    PT Comments    Patient needing more assist for transfers this session.  Not able to stand long enough to take steps for stand pivot due to legs giving away.  Feel she will need 24 hour care at d/c.  Will continue acute level PT to mobilize as tolerated for secondary prevention of side effects of bedrest.   Follow Up Recommendations  SNF;Supervision/Assistance - 24 hour     Equipment Recommendations  Other (comment) (TBA next venue)    Recommendations for Other Services       Precautions / Restrictions Precautions Precautions: Fall    Mobility  Bed Mobility Overal bed mobility: Needs Assistance Bed Mobility: Rolling;Sidelying to Sit Rolling: Mod assist Sidelying to sit: Mod assist;+2 for physical assistance          Transfers Overall transfer level: Needs assistance   Transfers: Sit to/from Stand;Stand Pivot Transfers;Squat Pivot Transfers Sit to Stand: Max assist;+2 physical assistance Stand pivot transfers: Total assist       General transfer comment: squat pivot with +1 total A to BSC to R; attempted stand pivot with RW, but pt's legs gave away and sat back on edge of BSC; assist of 3 for sit to stand and tech removed BSC and placed recliner behind her and pt lowered onto chair  Ambulation/Gait             General Gait Details: unable   Stairs            Wheelchair Mobility    Modified Rankin (Stroke Patients Only)       Balance Overall  balance assessment: Needs assistance Sitting-balance support: Feet unsupported;Bilateral upper extremity supported Sitting balance-Leahy Scale: Poor Sitting balance - Comments: leaning posteriorly at EOB, min A for balance   Standing balance support: Bilateral upper extremity supported;During functional activity Standing balance-Leahy Scale: Zero Standing balance comment: neds max A +2 to stand safely few seconds                    Cognition Arousal/Alertness: Awake/alert Behavior During Therapy: WFL for tasks assessed/performed Overall Cognitive Status: Impaired/Different from baseline Area of Impairment: Following commands;Attention;Memory   Current Attention Level: Focused Memory: Decreased short-term memory Following Commands: Follows one step commands with increased time;Follows one step commands inconsistently       General Comments: perseverates on needing to use the bathroom, but did not attempt when on [facility], has foley    Exercises General Exercises - Lower Extremity Ankle Circles/Pumps: PROM;AAROM;10 reps;Supine;Both Heel Slides: PROM;AAROM;Both;5 reps;Supine    General Comments        Pertinent Vitals/Pain Pain Assessment: Faces Faces Pain Scale: Hurts even more Pain Location: R hip with weight bearing, attempts at transfers Pain Descriptors / Indicators: Grimacing;Guarding Pain Intervention(s): Monitored during session;Repositioned;Limited activity within patient's tolerance    Home Living                      Prior Function            PT Goals (current goals can  now be found in the care plan section) Acute Rehab PT Goals PT Goal Formulation: Patient unable to participate in goal setting Time For Goal Achievement: 09/29/16 Potential to Achieve Goals: Fair Progress towards PT goals: Goals downgraded-see care plan    Frequency    Min 2X/week      PT Plan Current plan remains appropriate    Co-evaluation             End of  Session Equipment Utilized During Treatment: Gait belt Activity Tolerance: Patient limited by fatigue Patient left: in chair;with call bell/phone within reach;with chair alarm set     Time: [id number] PT Time Calculation (min) (ACUTE ONLY): 31 min  Charges:  $Therapeutic Activity: 23-37 mins                    G CodesReginia Harrell 27-Sep-2016, 5:08 PM  Christy Harrell, [place] 09/26/2016

## 2016-09-15 NOTE — Consult Note (Addendum)
Consultation Note Date: 09/15/2016   Patient Name: Christy Harrell  DOB: 30-Apr-1941  MRN: [id number]  Age / Sex: 76 y.o., female  PCP: Janith Lima, MD Referring Physician: Lavina Hamman, MD  Reason for Consultation: Establishing goals of care  HPI/Patient Profile: 76 y.o. female  with past medical history of metastatic breast cancer (followed by [facility]), DM2, HTN, and HLD who presented with AMS and was admitted on 08/28/2016. Work-up for etiology has been non-revealing. Labs non-contributory. EEG negative for epileptiform activity. LP with no evidence of infection with cytology negative for leptomeningeal spread. MRI and CT brain both negative for metastasis. UA without pyuria. 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 Of note, the steroids have provided some improvement, but she remains far from baseline. Palliative consulted to assist family in goals of care discussion in light of minimal improvement.   Clinical Assessment and Goals of Care: I spoke with primary team, Dr. Posey Pronto, to discuss this case. Despite extensive work-up, the etiology of Christy Harrell's acute encephalopathy remains unclear. They are trying high dose steroids in a last attempt to improve her status, however she is now on day 5/5 and improvement remains variable (periods of waxing and waning). Unfortunately, she is not consistently eating and remains lethargic in bed. Conversations with her daughter, who is in [place], have been ongoing with the primary team. Currently, the plan is for Dr. Posey Pronto to update the daughter today, explain the involvement of Palliative Care, and explain the plan to continue to monitor today (giving the steroids the appropriate time for expected effect). I will follow-up with the patient's daughter tomorrow to discuss the plan moving forward.  Today, [name] is awake and alert on my arrival  to her room. She is socially conversational, though slow to respond. She was oriented to self only. She could not tell me her birthday, her daughter's name, where she was, or what was going on. She did mention that she did not feel well, but was unable to clarify any specific concerns or complaints. After introducing myself I attempted to orient her to location and situation. Despite explanations, she could not recall the information shortly after. I was able to ask her general goals of care questions, and she was able to tell me that spending time with friends brought her joy, and that she did not like going to medical appointments.   Primary Decision Maker NEXT OF KIN   SUMMARY OF RECOMMENDATIONS    DNR  Today is day 5/5 of high dose steroids, she remains confused and non-decisional. I will plan to talk with her daughter tomorrow, per primary team request. Assuming no change in mentation tomorrow, she will need SNF (she would likely qualify for hospice in light of her metastatic cancer, but will need to discuss this with daughter further; cost of Hospice is also a consideration if at SNF)  I've also ordered a respiratory virus panel, as I've seen prolonged AMS in the setting of viral illness in older adults  Megace stopped due to risk for thromboembolic adverse effect and potential for causing confusion  Code Status/Advance Care Planning:  DNR  Symptom Management:   Unclear on symptom burden, pt consistently moaning in bed and reporting not feeling well, but unable to clarify that further. Will start sch tylenol and see if that improves her moaning and look of discomfort.  Previous bowel incontinence, with last recorded bowel movement on 2/3, if no BM  by tomorrow will start bowel regimen  Palliative Prophylaxis:   Aspiration, Frequent Pain Assessment, Oral Care and Turn Reposition  Psycho-social/Spiritual:   Desire for further Chaplaincy support:no  Additional Recommendations:  TBD  Prognosis:   Unable to determine. If pt is transitioned to comfort care, I expect her prognosis would be <16mo in light of her metastatic cancer, variable intake, and high risk for infection due to debility.   Discharge Planning: To Be Determined      Primary Diagnoses: Present on Admission: . Altered mental state . Failure to thrive in adult . Hypothyroidism . Type 2 diabetes, uncontrolled, with neuropathy ([place]) . Hypokalemia . Thrombocytopenia ([place]) . 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 The following aspects are pertinent.  Past Medical History:  Diagnosis Date  . Arthritis   . Cancer ([place])    right breast  . Club foot    chronic limp  . Diabetes mellitus    metformin  . GERD (gastroesophageal reflux disease)    watches diet  . History of radiation therapy 02/22/13- 03/13/13   left proximal humerus 3500 cGy 14 sessions  . Hyperlipidemia   . Hypertension   . Lymphedema    right arm  . Metastasis from malignant tumor of breast ([place])    left humerous  . Metastasis from malignant tumor of breast ([place])    liver  . Neuromuscular disorder ([facility])    lt arm numb sometimes  . Osteoporosis   . Thyroid disease   . Wears glasses    Social History   Social History  . Marital status: Single    Spouse name: N/A  . Number of children: N/A  . Years of education: N/A   Social History Main Topics  . Smoking status: Former Smoker    Quit date: 12/23/1974  . Smokeless tobacco: Never Used  . Alcohol use No  . Drug use: No  . Sexual activity: Not Currently   Other Topics Concern  . None   Social History Narrative  . None   Family History  Problem Relation Age of Onset  . Dementia Mother   . Hearing loss Mother   . Cancer Neg Hx   . Arthritis Neg Hx   . Heart disease Neg Hx   . Hyperlipidemia Neg Hx   . Hypertension Neg Hx    Scheduled Meds: . chlorhexidine   15 mL Mouth Rinse BID  . enoxaparin (LOVENOX) injection  1.5 mg/kg Subcutaneous Q24H  . feeding supplement (ENSURE ENLIVE)  237 mL Oral TID BM  . folic acid  1 mg Oral Daily  . levothyroxine  175 mcg Oral QAC breakfast  . mouth rinse  15 mL Mouth Rinse q12n4p  . megestrol  400 mg Oral Daily  . methylPREDNISolone (SOLU-MEDROL) injection  1,000 mg Intravenous Daily  . modafinil  100 mg Oral Daily  . potassium chloride  40 mEq Oral BID   Continuous Infusions: . dextrose 5 % and 0.45 % NaCl with KCl 40 mEq/L 75 mL/hr at 09/14/16 1100   PRN Meds:.acetaminophen **OR** acetaminophen, docusate sodium, lip balm, loperamide, lubiprostone, magic mouthwash, ondansetron **OR** ondansetron (ZOFRAN) IV, sodium chloride flush Allergies  Allergen Reactions  . Bydureon [Exenatide] Rash  . Gadolinium Derivatives Other (See Comments)    Pt began having chest numbness/tingling , stated her heart felt funny, had to take her to ED for EKG per RN   CAP  .  Enalapril Rash   Review of Systems: Limited by confusion.  -Reports she doesn't feel well.  -Denies nausea, vomiting, diarrhea, HA, generalized or specific pain, stiffness, soreness, and sleep issues.   Physical Exam  Constitutional: She appears cachectic. She has a sickly appearance.  HENT:  Mouth/Throat: Oropharynx is clear and moist. No oropharyngeal exudate.  Eyes: EOM are normal.  Neck: Normal range of motion. Neck supple.  Pulmonary/Chest: Effort normal. No respiratory distress.  Abdominal: Soft. There is no tenderness.  Musculoskeletal: She exhibits edema (RUE, BLE).  Neurological: She is alert.  Socially conversational, though slow to respond. Oriented to self only. Could not tell me her birthday or daughter's name. When this information was given, she could not recall it a few minutes later.  Skin: Skin is warm and dry. There is pallor.  Toenails long and curling  Psychiatric: She has a normal mood and affect. Thought content normal. Her  speech is delayed. She is slowed. Cognition and memory are impaired. She exhibits abnormal recent memory and abnormal remote memory.    Vital Signs: BP 117/64 (BP Location: Left Arm)   Pulse 92   Temp 99.2 F (37.3 C)   Resp 20   Ht 5' (1.524 m)   Wt 51.8 kg (114 lb 3.2 oz)   SpO2 98%   BMI 22.30 kg/m  Pain Assessment: No/denies pain   Pain Score: 0-No pain   SpO2: SpO2: 98 % O2 Device:SpO2: 98 % O2 Flow Rate: .O2 Flow Rate (L/min): 3 L/min  IO: Intake/output summary:  Intake/Output Summary (Last 24 hours) at 09/15/16 0834 Last data filed at 09/15/16 0537  Gross per 24 hour  Intake          1734.25 ml  Output              850 ml  Net           884.25 ml    LBM: Last BM Date: 09/12/16 Baseline Weight: Weight: 51.8 kg (114 lb 3.2 oz) Most recent weight: Weight: 51.8 kg (114 lb 3.2 oz)     Palliative Assessment/Data: PPS 30-40%   Flowsheet Rows   Flowsheet Row Most Recent Value  Intake Tab  Referral Department  Hospitalist  Unit at Time of Referral  Med/Surg Unit  Palliative Care Primary Diagnosis  Cancer  Date Notified  09/01/16  Palliative Care Type  Not seen  Reason for referral  Clarify Goals of Care  Date of Admission  08/28/16  # of days IP prior to Palliative referral  4  Clinical Assessment  Psychosocial & Spiritual Assessment  Palliative Care Outcomes      Time Total: 50 minutes Greater than 50%  of this time was spent counseling and coordinating care related to the above assessment and plan.  Signed by: Charlynn Court, NP Palliative Medicine Team Pager # [phone] (M-F 7a-5p) Team Phone # [phone] (Nights/Weekends)

## 2016-09-15 NOTE — Progress Notes (Signed)
Nutrition Follow-up  DOCUMENTATION CODES:   Severe malnutrition in context of chronic illness  INTERVENTION:   -Continue Ensure Enlive po BID, each supplement provides 350 kcal and 20 grams of protein -Continue Magic Cup TID  NUTRITION DIAGNOSIS:   Malnutrition related to chronic illness as evidenced by percent weight loss, severe depletion of body fat, severe depletion of muscle mass.  Ongoing  GOAL:   Patient will meet greater than or equal to 90% of their needs  Unmet  MONITOR:   PO intake, Supplement acceptance, Labs, Weight trends, I & O's  REASON FOR ASSESSMENT:   Consult Assessment of nutrition requirement/status  ASSESSMENT:   76 year old female with a history of metastatic breast cancer, hypertension, diabetes mellitus, hyperlipidemia presenting with altered mental status. The patient is poor historian.  Much of the history is provided through the patient's daughter who lives in [place]. Apparently, the patient's daughter noted that the patient was sleepy and repeating herself on the phone on the evening of 08/26/2016. On the morning of 08/27/2016, the patient's cousin was unable to reach the patient, and so the neighbor to check up on the patient. The patient was found sitting on the commode and unable to get up and walk. EMS was activated, and the patient was transferred to sit in a chair and that done. She was noted to be hypotensive at that time and given 500 mL bolus of fluid. The patient refused transfer to the hospital. On the morning of 08/28/2016, the patient's cousin went to check up on the patient again, and the patient was noted not to have moved from the chair in the den from the previous day. Apparently, the patient was confused and noted to be incontinent of urine and stool.  Pt sleeping soundly at time of visit. RD did not disturb.   Intake remains minimal; PO: 0-10%. Pt continues with dysphagia 1 diet for ease of intake. Per RN, pt is holding  food and liquids in mouth and then spitting them out.   Palliative care team following to address goals of care. HCPOA does not desire feeding tube.   Labs reviewed.   Diet Order:  Diet - low sodium heart healthy DIET - DYS 1 Room service appropriate? Yes; Fluid consistency: Thin  Skin:  Reviewed, no issues  Last BM:  09/12/16  Height:   Ht Readings from Last 1 Encounters:  08/29/16 5' (1.524 m)    Weight:   Wt Readings from Last 1 Encounters:  08/29/16 114 lb 3.2 oz (51.8 kg)    Ideal Body Weight:  45.5 kg  BMI:  Body mass index is 22.3 kg/m.  Estimated Nutritional Needs:   Kcal:  1400-1600  Protein:  65-75g  Fluid:  1.6L/day  EDUCATION NEEDS:   No education needs identified at this time  Hayly Litsey A. Jimmye Norman, RD, LDN, CDE Pager: [phone] After hours Pager: [phone]

## 2016-09-15 NOTE — Progress Notes (Signed)
[facility] Progress Note  Patient: Christy Harrell [id number]   PCP: [name], MD DOB: 13-Dec-1940   DOA: 08/28/2016   DOS: 09/15/2016   Date of Service: the patient was seen and examined on 09/15/2016  Brief hospital course: Pt. with PMH of metastatic breast cancer, hypertension, diabetes mellitus, hyperlipidemia presenting with altered mental status. The patient is poor historian. Much of the history is provided through the patient's daughter who lives in [place]. Apparently, the patient's daughter noted that the patient was sleepy and repeating herself on the phone on the evening of 08/26/2016. On the morning of 08/27/2016, the patient's cousin was unable to reach the patient, and so the neighbor to check up on the patient. The patient was found sitting on the commode and unable to get up and walk. EMS was called. She was noted to be hypotensive at that time and given 500 mL bolus of fluid. The patient refused transfer to the hospital. On the morning of 08/28/2016, the patient's cousin went to check up on the patient again, and the patient was noted not to have moved from the chair in the denfrom the previous day. patient has had a gradual functional decline since August 2017. After initial workup being negative, Pt was transferred from [facility] to [facility] on 1/21 for further neurological work up. EEG was done and showed no evidence of epileptiform activity.  LP was performed, no evidence of infection identified, cytology negative for any leptomeningeal spread, patient was started on high-dose IV Solu-Medrol, patient does show some improvement but not meaningful enough with persistent poor oral intake and lethargy. Currently further plan is Consulting palliative care for further input and goals of care discussion.  Assessment and Plan: Acute metabolic encephalopathy - Dehydration and volume depletion contributing to her encephalopathy, ? Paraneoplastic syndrome  - TSH 2.106 - Ammonia  26 - serum B12--1645 - RPR--neg - MRI of the brain with contrast and CT head both neg for metastasis. - Urinalysis negative for pyuria - neurology team consulted, EEG done but not revealing etiology of the encephalopathy  - discussed with Dr. [name]. - No sedative medications, no evidence of infection or meningeal signs. - ABG shows hyperventilation. Mild hypoxia.  - Slowly getting better this morning, although still not at her baseline,  - Appreciate neurology input, unable to perform lumbar puncture twice. One at bedside as well as 1 by IR.  - Dry tap, repeat LP performed on 09/11/2016. - Attempting Provigil trial and as a last resort for improvement in patient's mentation. 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  - Patient is showing somewhat improvement in her mentation although remains confused but is able to carry out meaningful conversation. Continues to have poor oral intake and I'm concerned regarding patient's prognosis both long-term as well as short-term. - I discussed in detail regarding patient's current condition with her daughter who is the power of attorney. If patient does not show any meaningful improvement despite being on high-dose steroids I recommended option for comfort care. She has asked questions regarding the use of a feeding tube, and I recommended that since her mentation is not getting better a feeding tube would not necessarily provide any quality of life for the patient and will be more harm than benefit, the daughter agrees with that and currently holding off on any feeding tube. No significant improvement in mentation at present I have consulted palliative care, attempt to reach the daughter on 09/15/2016 were not successful.  I left voicemail.  Failure to thrive, severe PCM  - consulted nutrition, appreciate assistance  - Poor oral intake actually none.  - Add megace. - Palliative care  consulted  Metastatic breast cancer - Initially diagnosed in March 2012 - Metastasis noted initially May 2014--metastasis to bone and liver - Has had history of pathologic fracture to the humerus - has been receiving eribulin, last does 07/23/2017 - appreciate Dr. [name] following   Osteonecrosis of the jaw - per Dr. Karmen Bongo, Bone was biopsied and sent to [facility] with result read as osteonecrosis of the jaw, no cancer or evidence of infection noted - 08/28/2016 CT neck right mandible with large bony sequestrum - started Amox/Clav that was ordered by her oral surgeon but pt did not fill Rx, completed treatment here in the hospital  Hypokalemia Replace and Monitor Add to IV fluids, increase oral supplement from 20 to 40 bid.  Thrombocytopenia, pancytopenia  - possibly due to acute DVT, recent worsening is due to dilution from fluids, will monitor  - currently no signs of bleeding  - Serum B12--1645 - TSH 2.106  Acute DVT Left Lower Extremity - venous duplex--+DVT left common iliac and left external iliac - started apixaban, switched to lovenox for need for LP, currently continuing Lovenox due to patient's minimal oral intake status.  DM2 - A1C 4.6 - allow liberal control - no need for SSI  Hyperlipidemia - continue statin  Dysphagia - evaluated by speech-->dys 1 with thin liquids  Diarrhea  diarrhea frequency reducing. Negative C. Difficile, use imodium  Bowel regimen: last BM 09/12/2016 Diet: dys 1 diet DVT Prophylaxis: on therapeutic anticoagulation.  Advance goals of care discussion: DNR DNI  Family Communication: no family was present at bedside, at the time of interview.  Discussed with daughter on the phone on 09/13/2016 she request to continue current plan of care. Recommended if the patient does not show any meaningful improvement and if the cytology is negative comfort care would be best course of action for the patient. Attempt to reach the  daughter on phone on 09/15/2016 was unsuccessful, I left a voicemail.  Disposition:  Discharge to SNF. Expected discharge date: 09/16/2016, pending improvement in mentation and oral intake as well as further goals of care discussion  Consultants: oncology, neurology, palliative care Procedures: LP  Antibiotics: Anti-infectives    Start     Dose/Rate Route Frequency Ordered Stop   08/30/16 1245  amoxicillin-clavulanate (AUGMENTIN) 875-125 MG per tablet 1 tablet     1 tablet Oral Every 12 hours 08/30/16 1236 09/04/16 2142     Subjective: again drowsy and lethargic, not following commands  Objective: Physical Exam: Vitals:   09/14/16 1443 09/14/16 2226 09/15/16 0539 09/15/16 1536  BP: 116/75 (!) 106/57 117/64 95/62  Pulse: 74 85 92 84  Resp: 18 18 20 18   Temp: 98.9 F (37.2 C) 98.5 F (36.9 C) 99.2 F (37.3 C) 99.2 F (37.3 C)  TempSrc: Oral Oral  Oral  SpO2: 99% 98% 98% 99%  Weight:      Height:        Intake/Output Summary (Last 24 hours) at 09/15/16 1622 Last data filed at 09/15/16 1542  Gross per 24 hour  Intake             2373 ml  Output             1250 ml  Net             1123 ml   Filed  Weights   08/29/16 0041  Weight: 51.8 kg (114 lb 3.2 oz)    General: awake, oriented to self, not able to follow command or carry out long conversation Eyes: PERRL, Conjunctiva normal ENT: Oral Mucosa clear moist. Cardiovascular: S1 and S2 Present, no Murmur, Respiratory: Bilateral Air entry equal and Decreased, no use of accessory muscle, Clear to Auscultation, no Crackles, no wheezes Abdomen: Bowel Sound present, Soft and no tenderness Skin: no redness, no Rash, no induration Extremities: right upper extremity edema, trace Pedal edema, no calf tenderness  Data Reviewed: CBC:  Recent Labs Lab 09/10/16 0528 09/15/16 0424  WBC 4.1 6.3  NEUTROABS 2.7  --   HGB 9.9* 11.1*  HCT 30.2* 33.3*  MCV 91.5 90.7  PLT 85* [id number]*   Basic Metabolic Panel:  Recent Labs Lab  09/09/16 1032 09/10/16 0528 09/11/16 1205 09/12/16 0441 09/13/16 0500 09/13/16 1545 09/14/16 0500 09/15/16 0424  NA 145 143 139 138 140 139 139 138  K 3.7 3.1* 3.0* 3.5 2.8* 3.2* 3.6 4.4  CL 114* 115* 114* 110 112* 109 111 107  CO2 26 25 22  21* 24 22 24 23   GLUCOSE 107* 87 137* 201* 186* 160* 169* 123*  BUN 15 12 7 6 6 6 7 7   CREATININE 0.43* 0.41* 0.40* 0.38* 0.53 0.44 0.41* 0.36*  CALCIUM 8.2* 7.5* 7.6* 7.7* 7.7* 7.6* 7.5* 7.9*  MG 1.7 1.9 1.7 1.6* 2.1  --  1.9 1.9  PHOS 2.4* 2.5  --   --   --   --   --   --     CBG: No results for input(s): GLUCAP in the last 168 hours. Studies: No results found.   Scheduled Meds: . chlorhexidine  15 mL Mouth Rinse BID  . enoxaparin (LOVENOX) injection  1.5 mg/kg Subcutaneous Q24H  . feeding supplement (ENSURE ENLIVE)  237 mL Oral TID BM  . [START ON 09/16/2016] levothyroxine  175 mcg Oral QAC breakfast  . mouth rinse  15 mL Mouth Rinse q12n4p  . modafinil  100 mg Oral Daily  . potassium chloride  40 mEq Oral BID   Continuous Infusions: . dextrose 5 % and 0.45 % NaCl with KCl 40 mEq/L 75 mL/hr at 09/14/16 1100   PRN Meds: acetaminophen, docusate sodium, lip balm, loperamide, lubiprostone, magic mouthwash, ondansetron **OR** ondansetron (ZOFRAN) IV, sodium chloride flush  Time spent: 30 minutes  Author: Berle Mull, MD [facility] Pager: [phone] 09/15/2016 4:22 PM  If 7PM-7AM, please contact night-coverage at [email or web address], password [facility]

## 2016-09-15 NOTE — Progress Notes (Signed)
PT goals are expired informed supervising PT to follow up and address POC.   Governor Rooks, PTA pager [phone]

## 2016-09-16 DIAGNOSIS — G9340 Encephalopathy, unspecified: Secondary | ICD-10-CM

## 2016-09-16 LAB — BASIC METABOLIC PANEL
ANION GAP: 11 (ref 5–15)
BUN: 6 mg/dL (ref 6–20)
CO2: 21 mmol/L — ABNORMAL LOW (ref 22–32)
Calcium: 7.6 mg/dL — ABNORMAL LOW (ref 8.9–10.3)
Chloride: 105 mmol/L (ref 101–111)
Creatinine, Ser: 0.39 mg/dL — ABNORMAL LOW (ref 0.44–1.00)
Glucose, Bld: 184 mg/dL — ABNORMAL HIGH (ref 65–99)
POTASSIUM: 3.8 mmol/L (ref 3.5–5.1)
Sodium: 137 mmol/L (ref 135–145)

## 2016-09-16 LAB — MAGNESIUM: MAGNESIUM: 1.7 mg/dL (ref 1.7–2.4)

## 2016-09-16 MED ORDER — POTASSIUM CHLORIDE 20 MEQ/15ML (10%) PO SOLN
40.0000 meq | Freq: Two times a day (BID) | ORAL | Status: DC
  Administered 2016-09-16 – 2016-09-18 (×4): 40 meq via ORAL
  Filled 2016-09-16 (×5): qty 30

## 2016-09-16 MED ORDER — APIXABAN 5 MG PO TABS
5.0000 mg | ORAL_TABLET | Freq: Two times a day (BID) | ORAL | Status: DC
  Administered 2016-09-16 – 2016-09-18 (×4): 5 mg via ORAL
  Filled 2016-09-16 (×5): qty 1

## 2016-09-16 NOTE — Progress Notes (Signed)
[facility] Progress Note  Patient: Christy Harrell [id number]   PCP: [name], MD DOB: 1940-11-28   DOA: 08/28/2016   DOS: 09/16/2016   Date of Service: the patient was seen and examined on 09/16/2016  Brief hospital course: Pt. with PMH of metastatic breast cancer, hypertension, diabetes mellitus, hyperlipidemia presenting with altered mental status. The patient is poor historian. Much of the history is provided through the patient's daughter who lives in [place]. Apparently, the patient's daughter noted that the patient was sleepy and repeating herself on the phone on the evening of 08/26/2016. On the morning of 08/27/2016, the patient's cousin was unable to reach the patient, and so the neighbor to check up on the patient. The patient was found sitting on the commode and unable to get up and walk. EMS was called. She was noted to be hypotensive at that time and given 500 mL bolus of fluid. The patient refused transfer to the hospital. On the morning of 08/28/2016, the patient's cousin went to check up on the patient again, and the patient was noted not to have moved from the chair in the denfrom the previous day. patient has had a gradual functional decline since August 2017. After initial workup being negative, Pt was [facility] to [facility] on 1/21 for further neurological work up. EEG was done and showed no evidence of epileptiform activity.  LP was performed, no evidence of infection identified, cytology negative for any leptomeningeal spread, patient was started on high-dose IV Solu-Medrol, patient does show some improvement but not meaningful enough with persistent poor oral intake and lethargy. Currently further plan is Consulting palliative care for further input and goals of care discussion.  Assessment and Plan: Acute metabolic encephalopathy - Dehydration and volume depletion contributing to her encephalopathy, ? Paraneoplastic syndrome  - TSH 2.106 - Ammonia  26 - serum B12--1645 - RPR--neg - MRI of the brain with contrast and CT head both neg for metastasis. - Urinalysis negative for pyuria - neurology team consulted, EEG done but not revealing etiology of the encephalopathy  - discussed with Dr. [name]. - No sedative medications, no evidence of infection or meningeal signs. - ABG shows hyperventilation. Mild hypoxia.  - Slowly getting better this morning, although still not at her baseline,  - Appreciate neurology input, unable to perform lumbar puncture twice. One at bedside as well as 1 by IR.  - Dry tap, repeat LP performed on 09/11/2016. - Attempting Provigil trial and as a last resort for improvement in patient's mentation. 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  - Patient is showing somewhat improvement in her mentation although remains confused but is able to carry out meaningful conversation. Continues to have poor oral intake and I'm concerned regarding patient's prognosis both long-term as well as short-term. - I discussed in detail regarding patient's current condition with her daughter who is the power of attorney. If patient does not show any meaningful improvement despite being on high-dose steroids I recommended option for comfort care. She has asked questions regarding the use of a feeding tube, and I recommended that since her mentation is not getting better a feeding tube would not necessarily provide any quality of life for the patient and will be more harm than benefit, the daughter agrees with that and currently holding off on any feeding tube. Seems to be more alert, but still confused, not oriented, not eating Daughter is flying in to meet with palliative care on  2/8   Failure to thrive, severe PCM  - consulted nutrition, appreciate assistance  - Poor oral intake actually none.  - Add megace. - Palliative care consulted  Metastatic breast cancer - Initially  diagnosed in March 2012 - Metastasis noted initially May 2014--metastasis to bone and liver - Has had history of pathologic fracture to the humerus - has been receiving eribulin, last does 07/23/2017 - appreciate Dr. [name] following   Osteonecrosis of the jaw - per Dr. Karmen Bongo, Bone was biopsied and sent to [facility] with result read as osteonecrosis of the jaw, no cancer or evidence of infection noted - 08/28/2016 CT neck right mandible with large bony sequestrum - started Amox/Clav that was ordered by her oral surgeon but pt did not fill Rx, completed treatment here in the hospital  Hypokalemia Replace and Monitor Add to IV fluids, increase oral supplement from 20 to 40 bid.  Thrombocytopenia, pancytopenia  - possibly due to acute DVT, recent worsening is due to dilution from fluids, will monitor  - currently no signs of bleeding  - Serum B12--1645 - TSH 2.106  Acute DVT Left Lower Extremity - venous duplex--+DVT left common iliac and left external iliac - started apixaban, switched to lovenox for need for LP, currently continuing Lovenox due to patient's minimal oral intake status.  DM2 - A1C 4.6 - allow liberal control - no need for SSI  Hyperlipidemia - continue statin  Dysphagia - evaluated by speech-->dys 1 with thin liquids  Diarrhea  diarrhea frequency reducing. Negative C. Difficile, use imodium  Bowel regimen: last BM 09/12/2016 Diet: dys 1 diet DVT Prophylaxis: on therapeutic anticoagulation.  Advance goals of care discussion: DNR DNI  Family Communication: daughter is flying in to [place] to have meeting with palliative care.  Disposition:  Discharge to SNF.? Hospice? Expected discharge date: 09/17/2016, pending improvement in mentation and oral intake as well as further goals of care discussion  Consultants: oncology, neurology, palliative care Procedures: LP  Antibiotics: Anti-infectives    Start     Dose/Rate Route Frequency  Ordered Stop   08/30/16 1245  amoxicillin-clavulanate (AUGMENTIN) 875-125 MG per tablet 1 tablet     1 tablet Oral Every 12 hours 08/30/16 1236 09/04/16 2142     Subjective: again drowsy and lethargic, not following commands  Objective: Physical Exam: Vitals:   09/15/16 2249 09/16/16 0007 09/16/16 0529 09/16/16 1143  BP: 103/85 113/77 108/68   Pulse: 98 81 92   Resp: 20  18   Temp: 98.7 F (37.1 C) 97.9 F (36.6 C) 98.4 F (36.9 C)   TempSrc: Oral Oral Oral   SpO2: 95% 100% 99%   Weight:      Height:    5' (1.524 m)    Intake/Output Summary (Last 24 hours) at 09/16/16 1249 Last data filed at 09/16/16 1142  Gross per 24 hour  Intake           638.75 ml  Output              900 ml  Net          -261.25 ml   Filed Weights   08/29/16 0041  Weight: 51.8 kg (114 lb 3.2 oz)    General: awake, oriented to self, not able to follow command , very frail Eyes: PERRL, Conjunctiva normal ENT: Oral Mucosa clear moist. Cardiovascular: S1 and S2 Present, no Murmur, Respiratory: Bilateral Air entry equal and Decreased, no use of accessory muscle, Clear to Auscultation, no Crackles, no wheezes  Abdomen: Bowel Sound present, Soft and no tenderness Skin: no redness, no Rash, no induration Extremities: right upper extremity edema, trace Pedal edema, no calf tenderness  Data Reviewed: CBC:  Recent Labs Lab 09/10/16 0528 09/15/16 0424  WBC 4.1 6.3  NEUTROABS 2.7  --   HGB 9.9* 11.1*  HCT 30.2* 33.3*  MCV 91.5 90.7  PLT 85* [id number]*   Basic Metabolic Panel:  Recent Labs Lab 09/10/16 0528  09/12/16 0441 09/13/16 0500 09/13/16 1545 09/14/16 0500 09/15/16 0424 09/16/16 0435  NA 143  < > 138 140 139 139 138 137  K 3.1*  < > 3.5 2.8* 3.2* 3.6 4.4 3.8  CL 115*  < > 110 112* 109 111 107 105  CO2 25  < > 21* 24 22 24 23  21*  GLUCOSE 87  < > 201* 186* 160* 169* 123* 184*  BUN 12  < > 6 6 6 7 7 6   CREATININE 0.41*  < > 0.38* 0.53 0.44 0.41* 0.36* 0.39*  CALCIUM 7.5*  < > 7.7*  7.7* 7.6* 7.5* 7.9* 7.6*  MG 1.9  < > 1.6* 2.1  --  1.9 1.9 1.7  PHOS 2.5  --   --   --   --   --   --   --   < > = values in this interval not displayed.  CBG: No results for input(s): GLUCAP in the last 168 hours. Studies: No results found.   Scheduled Meds: . apixaban  5 mg Oral BID  . chlorhexidine  15 mL Mouth Rinse BID  . feeding supplement (ENSURE ENLIVE)  237 mL Oral TID BM  . levothyroxine  175 mcg Oral QAC breakfast  . mouth rinse  15 mL Mouth Rinse q12n4p  . modafinil  100 mg Oral Daily  . potassium chloride  40 mEq Oral BID   Continuous Infusions:  PRN Meds: acetaminophen, docusate sodium, lip balm, loperamide, lubiprostone, magic mouthwash, ondansetron **OR** ondansetron (ZOFRAN) IV, sodium chloride flush  Time spent: 5minutes  Author: Florencia Reasons, MD PhD [facility] Pager: [phone] 09/16/2016 12:49 PM  If 7PM-7AM, please contact night-coverage at [email or web address], password [facility]

## 2016-09-16 NOTE — Progress Notes (Addendum)
Daily Progress Note   Patient Name: Christy Harrell       Date: 09/16/2016 DOB: August 04, 1941  Age: 76 y.o. MRN#: [id number] Attending Physician: Florencia Reasons, MD Primary Care Physician: [name], MD Admit Date: 08/28/2016  Reason for Consultation/Follow-up: Disposition and Establishing goals of care  Subjective: Christy Harrell remains socially conversational. She was faster to respond today, with some improvement in her orientation. She was able to tell me her name and that she was at [facility]. She was also able to communicate that she was feeling better today, but had some abdominal pain that she relates to needing to have a bowel movement. She did not remember me from yesterday, nor could she recall her birthday, the present year, why she was here, or her daughter's name. While she is eating, her intake is minimal.  Length of Stay: 18  Current Medications: Scheduled Meds:  . chlorhexidine  15 mL Mouth Rinse BID  . enoxaparin (LOVENOX) injection  1.5 mg/kg Subcutaneous Q24H  . feeding supplement (ENSURE ENLIVE)  237 mL Oral TID BM  . levothyroxine  175 mcg Oral QAC breakfast  . mouth rinse  15 mL Mouth Rinse q12n4p  . modafinil  100 mg Oral Daily  . potassium chloride  40 mEq Oral BID    Continuous Infusions: . dextrose 5 % and 0.45 % NaCl with KCl 40 mEq/L 75 mL/hr at 09/16/16 0736    PRN Meds: acetaminophen, docusate sodium, lip balm, loperamide, lubiprostone, magic mouthwash, ondansetron **OR** ondansetron (ZOFRAN) IV, sodium chloride flush  Physical Exam       Constitutional: She appears cachectic. She has a sickly appearance.  HENT:  Mouth/Throat: Oropharynx is clear and moist. No oropharyngeal exudate.  Eyes: EOM are normal.  Neck: Normal range of motion. Neck supple.  Pulmonary/Chest: Effort normal. No respiratory distress.   Abdominal: Soft. There is no tenderness.  Musculoskeletal: She exhibits edema (RUE, BLE).  Neurological: She is alert.  Socially conversational. Oriented to self and location only. Could not tell me her birthday or daughter's name.  Skin: Skin is warm and dry. There is pallor.  Toenails long and curling  Psychiatric: She has a normal mood and affect. Thought content normal. Cognition and memory are impaired. She exhibits abnormal recent memory and abnormal remote memory.    Vital Signs: BP 108/68 (BP Location: Left Arm)   Pulse 92   Temp 98.4 F (36.9 C) (Oral)   Resp 18   Ht 5' (1.524 m)   Wt 51.8 kg (114 lb 3.2 oz)   SpO2 99%   BMI 22.30 kg/m  SpO2: SpO2: 99 % O2 Device: O2 Device: Not Delivered O2 Flow Rate: O2 Flow Rate (L/min): 3 L/min  Intake/output summary:  Intake/Output Summary (Last 24 hours) at 09/16/16 0848 Last data filed at 09/16/16 0144  Gross per 24 hour  Intake           638.75 ml  Output              650 ml  Net           -11.25 ml   LBM: Last BM Date: 09/12/16 Baseline Weight: Weight: 51.8 kg (114 lb 3.2 oz) Most recent  weight: Weight: 51.8 kg (114 lb 3.2 oz)  Palliative Assessment/Data: PPS 40-50%   Flowsheet Rows   Flowsheet Row Most Recent Value  Intake Tab  Referral Department  Hospitalist  Unit at Time of Referral  Med/Surg Unit  Palliative Care Primary Diagnosis  Cancer  Date Notified  09/14/16  Palliative Care Type  New Palliative care  Reason for referral  Clarify Goals of Care  Date of Admission  08/28/16  # of days IP prior to Palliative referral  17  Clinical Assessment  Psychosocial & Spiritual Assessment  Palliative Care Outcomes      Patient Active Problem List   Diagnosis Date Noted  . Palliative care by specialist   . Goals of care, counseling/discussion   . Leg DVT (deep venous thromboembolism), acute, left ([place]) 08/30/2016  . Acute deep vein thrombosis (DVT) of femoral vein of left lower extremity ([place])   . Acute  encephalopathy 08/29/2016  . Dehydration 08/29/2016  . Altered mental state 08/28/2016  . Failure to thrive in adult 08/28/2016  . Thrombocytopenia ([place]) 08/28/2016  . 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 Cancer related pain 05/13/2016  . Anemia 02/19/2016  . Port catheter in place 01/21/2016  . Right facial pain 12/03/2015  . Eczema, dyshidrotic 06/19/2015  . 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 Allergy history, radiographic dye 11/21/2014  . Metastases to the liver ([place]) 07/17/2014  . Routine general medical examination at a health care facility 07/10/2014  . Situational anxiety 04/18/2014  . Cough 03/08/2014  . Severe obesity (BMI >= 40) ([place]) 01/15/2014  . Coronary artery calcification seen on CAT scan 12/25/2013  . Atherosclerosis of aorta ([place]) 12/02/2013  . Hypokalemia 11/28/2013  . Unspecified constipation 08/21/2013  . Malignant neoplasm of lower-outer quadrant of left breast of female, estrogen receptor positive ([place]) 05/09/2013  . Venous stasis dermatitis 03/22/2013  . Bone metastasis ([place]) 01/31/2013  . Unspecified vitamin D deficiency 12/23/2012  . Low back pain 12/01/2012  . Lymphedema of upper extremity following lymphadenectomy 02/15/2012  . Spinal stenosis in cervical region 10/28/2011  . Cervical spondylitis with radiculitis ([place]) 10/28/2011  . Hyperlipidemia with target LDL less than 100 09/03/2011  . Type 2 diabetes, uncontrolled, with neuropathy ([place]) 09/03/2011  . Osteoporosis 09/03/2011  . Hypothyroidism 09/03/2011  . Generalized OA 09/03/2011    Palliative Care Assessment & Plan   HPI: 76 y.o. female  with past medical history of metastatic breast cancer (followed by [facility]), DM2, HTN, and HLD who presented with AMS and was admitted on 08/28/2016. Work-up for etiology has been non-revealing. Labs non-contributory. EEG negative for epileptiform activity. LP with no evidence of infection with  cytology negative for leptomeningeal spread. MRI and CT brain both negative for metastasis. UA without pyuria. 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 Of note, the steroids have provided some improvement, but she remains far from baseline. Palliative consulted to assist family in goals of care discussion in light of minimal improvement.   Assessment: Christy Harrell does seem to be making some improvement in her mentation/cognition and alertness, though she remains unable to preform self care and is not safe to be at home without 24 hour care. Her high-dose steroid course was completed 2/6 (five day course). She has been on consistent IV fluids since admission, however I have now stopped these today to see how she does. Presently, no further medical interventions or  investigations are being pursued. I have called and left a message for the patient's daughter to discuss discharge plan. I want to discuss SNF with palliative versus hospice, as well as discuss possible transition to [place] (which daughter had brought up as a possibility to primary team).   Recommendations/Plan:  DNR, continue supportive care  Voice message left for daughter with my contact information, have no heard back from her yet  I have stopped the MIVF to see how she does   ADDENDUM: I have spoken with the patient's daughter, her cell phone is [phone]. She is flying to [place] today and is planning to meet with me tomorrow. I will call her tomorrow morning to schedule a time, per her request.  Goals of Care and Additional Recommendations:  Limitations on Scope of Treatment: Full Scope Treatment  Code Status:  DNR  Prognosis:   < 6 months. Pt's intake is poor and her overall debility makes her at high risk for infection. She also has known metastatic breast cancer.  Discharge Planning:  To Be Determined  Care plan was discussed with pt and  primary team  Thank you for allowing the Palliative Medicine Team to assist in the care of this patient.  Total time 35 minutes    Greater than 50%  of this time was spent counseling and coordinating care related to the above assessment and plan.  Charlynn Court, NP Palliative Medicine Team [phone] pager (7a-5p) Team Phone # [phone]

## 2016-09-16 NOTE — Progress Notes (Signed)
Patients right arm has swelled more today. Provider notified and will continue to monitor.

## 2016-09-17 ENCOUNTER — Inpatient Hospital Stay (HOSPITAL_COMMUNITY): Payer: Medicare Other

## 2016-09-17 DIAGNOSIS — R609 Edema, unspecified: Secondary | ICD-10-CM

## 2016-09-17 LAB — RESPIRATORY PANEL BY PCR
ADENOVIRUS-RVPPCR: NOT DETECTED
Bordetella pertussis: NOT DETECTED
CORONAVIRUS HKU1-RVPPCR: NOT DETECTED
CORONAVIRUS NL63-RVPPCR: NOT DETECTED
Chlamydophila pneumoniae: NOT DETECTED
Coronavirus 229E: NOT DETECTED
Coronavirus OC43: NOT DETECTED
INFLUENZA A-RVPPCR: NOT DETECTED
Influenza B: NOT DETECTED
METAPNEUMOVIRUS-RVPPCR: NOT DETECTED
Mycoplasma pneumoniae: NOT DETECTED
PARAINFLUENZA VIRUS 2-RVPPCR: NOT DETECTED
PARAINFLUENZA VIRUS 3-RVPPCR: NOT DETECTED
PARAINFLUENZA VIRUS 4-RVPPCR: NOT DETECTED
Parainfluenza Virus 1: NOT DETECTED
RHINOVIRUS / ENTEROVIRUS - RVPPCR: NOT DETECTED
Respiratory Syncytial Virus: NOT DETECTED

## 2016-09-17 LAB — BASIC METABOLIC PANEL
Anion gap: 9 (ref 5–15)
BUN: 9 mg/dL (ref 6–20)
CHLORIDE: 107 mmol/L (ref 101–111)
CO2: 21 mmol/L — ABNORMAL LOW (ref 22–32)
CREATININE: 0.36 mg/dL — AB (ref 0.44–1.00)
Calcium: 7.5 mg/dL — ABNORMAL LOW (ref 8.9–10.3)
GFR calc Af Amer: >60 mL/min (ref >60)
GFR calc non Af Amer: >60 mL/min (ref >60)
GLUCOSE: 96 mg/dL (ref 65–99)
POTASSIUM: 3.9 mmol/L (ref 3.5–5.1)
Sodium: 137 mmol/L (ref 135–145)

## 2016-09-17 LAB — MAGNESIUM: Magnesium: 1.8 mg/dL (ref 1.7–2.4)

## 2016-09-17 NOTE — Progress Notes (Signed)
VASCULAR LAB PRELIMINARY  PRELIMINARY  PRELIMINARY  PRELIMINARY  Right upper extremity venous duplex completed.    Preliminary report:  There is no DVT or SVT noted in the right upper extremity.  Significant interstitial fluid noted throughout the right upper extremity.   Jaymond Waage, RVT 09/17/2016, 12:10 PM

## 2016-09-17 NOTE — Care Management Important Message (Signed)
Important Message  Patient Details  Name: Christy Harrell MRN: [id number] Date of Birth: [birth year]   Medicare Important Message Given:  Yes    Regis Hinton [name] 09/17/2016, 11:06 AM

## 2016-09-17 NOTE — Progress Notes (Signed)
Physical Therapy Treatment Patient Details Name: Christy Harrell MRN: [id number] DOB: Feb 22, 1941 Today's Date: 09/17/2016    History of Present Illness Christy Harrell is a 76 y.o. female with medical history significant of hypothyroidism, metastatic breat cancer undergoing chemotherapy, HTN, HLD, and DM presenting with AMS. Positive for extensive acute deep vein thrombosis involving the left common and external iliac veins,  common femoral vein, femoral vein, popliteal vein, gastro veins and calf veins.    PT Comments    Pt performed increased activity tolerated supine lower extremity exercises and upper extremity exercises.  Pt able to assist in repositioning.  Due to lethargy initially deferred OOB mobility.  Will plan for sitting balance next session.   Follow Up Recommendations  SNF;Supervision/Assistance - 24 hour     Equipment Recommendations  Other (comment)    Recommendations for Other Services       Precautions / Restrictions Precautions Precautions: Fall Restrictions Weight Bearing Restrictions: No    Mobility  Bed Mobility Overal bed mobility: Needs Assistance             General bed mobility comments: Post exercises patient able to follow commands to push with B LEs to scoot to head of bed.  Bed placed in trendelenberg position to improve ease.  mod assist +2.    Transfers Overall transfer level:  (did not perform.  )                  Ambulation/Gait                 Stairs            Wheelchair Mobility    Modified Rankin (Stroke Patients Only)       Balance                                    Cognition Arousal/Alertness: Awake/alert Behavior During Therapy: WFL for tasks assessed/performed Overall Cognitive Status: Impaired/Different from baseline Area of Impairment: Following commands;Attention;Memory   Current Attention Level: Focused Memory: Decreased short-term memory Following Commands: Follows  one step commands with increased time;Follows one step commands inconsistently       General Comments: Pt intermittently falling asleep intially became more alert as tx progressed.      Exercises Total Joint Exercises Towel Squeeze: AROM;Both;10 reps;Supine General Exercises - Lower Extremity Ankle Circles/Pumps: AROM;Both;10 reps;Supine Quad Sets: AROM;Both;10 reps;Supine Heel Slides: AROM;Both;10 reps;Supine;AAROM Hip ABduction/ADduction: AROM;Both;10 reps;Supine;AAROM Straight Leg Raises: AROM;Both;10 reps;Supine;AAROM Shoulder Exercises Elbow Flexion: AROM;Both;AAROM;Supine Elbow Extension: AROM;Both;AAROM;Supine Other Exercises Other Exercises: chest presses: both: supine: 1x10 reps    General Comments        Pertinent Vitals/Pain Pain Assessment: Faces Faces Pain Scale: No hurt    Home Living                      Prior Function            PT Goals (current goals can now be found in the care plan section) Acute Rehab PT Goals Potential to Achieve Goals: Fair Progress towards PT goals: Progressing toward goals    Frequency    Min 2X/week      PT Plan Current plan remains appropriate    Co-evaluation             End of Session   Activity Tolerance: Patient tolerated treatment well;Patient limited by fatigue Patient left: in bed;with call bell/phone within  reach;with bed alarm set     Time: [phone] PT Time Calculation (min) (ACUTE ONLY): 23 min  Charges:  $Therapeutic Exercise: 23-37 mins                    G Codes:      Cristela Blue September 18, 2016, 5:40 PM Governor Rooks, PTA pager [phone]

## 2016-09-17 NOTE — Progress Notes (Signed)
[facility] Progress Note  Patient: Christy Harrell [id number]   PCP: [name], MD DOB: 08-Aug-1941   DOA: 08/28/2016   DOS: 09/17/2016   Date of Service: the patient was seen and examined on 09/17/2016  Brief hospital course: Pt. with PMH of metastatic breast cancer, hypertension, diabetes mellitus, hyperlipidemia presenting with altered mental status. The patient is poor historian. Much of the history is provided through the patient's daughter who lives in [place]. Apparently, the patient's daughter noted that the patient was sleepy and repeating herself on the phone on the evening of 08/26/2016. On the morning of 08/27/2016, the patient's cousin was unable to reach the patient, and so the neighbor to check up on the patient. The patient was found sitting on the commode and unable to get up and walk. EMS was called. She was noted to be hypotensive at that time and given 500 mL bolus of fluid. The patient refused transfer to the hospital. On the morning of 08/28/2016, the patient's cousin went to check up on the patient again, and the patient was noted not to have moved from the chair in the denfrom the previous day. patient has had a gradual functional decline since August 2017. After initial workup being negative, Pt was transferred [facility] on 1/21 for further neurological work up. EEG was done and showed no evidence of epileptiform activity.  LP was performed, no evidence of infection identified, cytology negative for any leptomeningeal spread, patient was started on high-dose IV Solu-Medrol, patient does show some improvement but not meaningful enough with persistent poor oral intake and lethargy. Currently further plan is Consulting palliative care for further input and goals of care discussion.  Assessment and Plan: Acute metabolic encephalopathy - Dehydration and volume depletion contributing to her encephalopathy, ? Paraneoplastic syndrome  - TSH 2.106 - Ammonia  26 - serum B12--1645 - RPR--neg - MRI of the brain with contrast and CT head both neg for metastasis. - Urinalysis negative for pyuria - neurology team consulted, EEG done but not revealing etiology of the encephalopathy  - discussed with Dr. [name]. - No sedative medications, no evidence of infection or meningeal signs. - ABG shows hyperventilation. Mild hypoxia.  - Slowly getting better this morning, although still not at her baseline,  - Appreciate neurology input, unable to perform lumbar puncture twice. One at bedside as well as 1 by IR.  - Dry tap, repeat LP performed on 09/11/2016. - Attempting Provigil trial and as a last resort for improvement in patient's mentation. 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  - Patient is showing somewhat improvement in her mentation although remains confused but is able to carry out meaningful conversation. Continues to have poor oral intake and I'm concerned regarding patient's prognosis both long-term as well as short-term. - I discussed in detail regarding patient's current condition with her daughter who is the power of attorney. If patient does not show any meaningful improvement despite being on high-dose steroids I recommended option for comfort care. She has asked questions regarding the use of a feeding tube, and I recommended that since her mentation is not getting better a feeding tube would not necessarily provide any quality of life for the patient and will be more harm than benefit, the daughter agrees with that and currently holding off on any feeding tube. Seems to be more alert, but still confused, not oriented, not eating Daughter is flying in to meet with palliative care on  2/8   Failure to thrive, severe PCM  - consulted nutrition, appreciate assistance  - Poor oral intake actually none.  - Add megace. - Palliative care consulted  Metastatic breast cancer - Initially  diagnosed in March 2012 - Metastasis noted initially May 2014--metastasis to bone and liver - Has had history of pathologic fracture to the humerus - has been receiving eribulin, last does 07/23/2017 - appreciate Dr. [name] following   Osteonecrosis of the jaw - per Dr. Karmen Bongo, Bone was biopsied and sent to [facility] with result read as osteonecrosis of the jaw, no cancer or evidence of infection noted - 08/28/2016 CT neck right mandible with large bony sequestrum - started Amox/Clav that was ordered by her oral surgeon but pt did not fill Rx, completed treatment here in the hospital  Hypokalemia Replace and Monitor Add to IV fluids, increase oral supplement from 20 to 40 bid.  Thrombocytopenia, pancytopenia  - possibly due to acute DVT, recent worsening is due to dilution from fluids, will monitor  - currently no signs of bleeding  - Serum B12--1645 - TSH 2.106  Acute DVT Left Lower Extremity - venous duplex--+DVT left common iliac and left external iliac - started apixaban, switched to lovenox for need for LP, currently continuing Lovenox due to patient's minimal oral intake status.  DM2 - A1C 4.6 - allow liberal control - no need for SSI  Hyperlipidemia - continue statin  Dysphagia - evaluated by speech-->dys 1 with thin liquids  Diarrhea  diarrhea frequency reducing. Negative C. Difficile, use imodium  Bowel regimen: last BM 09/12/2016 Diet: dys 1 diet DVT Prophylaxis: on therapeutic anticoagulation.  Advance goals of care discussion: DNR DNI  Family Communication: daughter is flying in to [place] to have meeting with palliative care.  Disposition:  Discharge to SNF.? Hospice? Expected discharge date: 09/17/2016, pending improvement in mentation and oral intake as well as further goals of care discussion  Consultants: oncology, neurology, palliative care Procedures: LP  Antibiotics: Anti-infectives    Start     Dose/Rate Route Frequency  Ordered Stop   08/30/16 1245  amoxicillin-clavulanate (AUGMENTIN) 875-125 MG per tablet 1 tablet     1 tablet Oral Every 12 hours 08/30/16 1236 09/04/16 2142     Subjective: again drowsy and lethargic, not following commands  Objective: Physical Exam: Vitals:   09/16/16 1544 09/16/16 1641 09/16/16 2132 09/17/16 0536  BP: (!) 100/59  (!) 98/58 106/63  Pulse: 87  93 (!) 40  Resp: 18  18 18   Temp: 98.1 F (36.7 C) 98.2 F (36.8 C) 98.8 F (37.1 C) 98.1 F (36.7 C)  TempSrc: Oral Oral Oral Oral  SpO2: 98%  100% 99%  Weight:      Height:        Intake/Output Summary (Last 24 hours) at 09/17/16 0840 Last data filed at 09/16/16 1829  Gross per 24 hour  Intake               60 ml  Output              450 ml  Net             -390 ml   Filed Weights   08/29/16 0041  Weight: 51.8 kg (114 lb 3.2 oz)    General: drowsy, not able to follow command , very frail Eyes: PERRL, Conjunctiva normal ENT: Oral Mucosa clear moist. Cardiovascular: S1 and S2 Present, no Murmur, Respiratory: Bilateral Air entry equal and Decreased, no use of accessory  muscle, Clear to Auscultation, no Crackles, no wheezes Abdomen: Bowel Sound present, Soft and no tenderness Skin: no redness, no Rash, no induration Extremities: right upper extremity edema, trace Pedal edema, no calf tenderness  Data Reviewed: CBC:  Recent Labs Lab 09/15/16 0424  WBC 6.3  HGB 11.1*  HCT 33.3*  MCV 90.7  PLT [id number]*   Basic Metabolic Panel:  Recent Labs Lab 09/13/16 0500 09/13/16 1545 09/14/16 0500 09/15/16 0424 09/16/16 0435 09/17/16 0335  NA 140 139 139 138 137  --   K 2.8* 3.2* 3.6 4.4 3.8  --   CL 112* 109 111 107 105  --   CO2 24 22 24 23  21*  --   GLUCOSE 186* 160* 169* 123* 184*  --   BUN 6 6 7 7 6   --   CREATININE 0.53 0.44 0.41* 0.36* 0.39*  --   CALCIUM 7.7* 7.6* 7.5* 7.9* 7.6*  --   MG 2.1  --  1.9 1.9 1.7 1.8    CBG: No results for input(s): GLUCAP in the last 168 hours. Studies: No  results found.   Scheduled Meds: . apixaban  5 mg Oral BID  . chlorhexidine  15 mL Mouth Rinse BID  . feeding supplement (ENSURE ENLIVE)  237 mL Oral TID BM  . levothyroxine  175 mcg Oral QAC breakfast  . mouth rinse  15 mL Mouth Rinse q12n4p  . modafinil  100 mg Oral Daily  . potassium chloride  40 mEq Oral BID   Continuous Infusions:  PRN Meds: acetaminophen, docusate sodium, lip balm, loperamide, lubiprostone, magic mouthwash, ondansetron **OR** ondansetron (ZOFRAN) IV, sodium chloride flush  Time spent: 16minutes  Author: Florencia Reasons, MD PhD [facility] Pager: [phone] 09/17/2016 8:40 AM  If 7PM-7AM, please contact night-coverage at [email or web address], password [facility]

## 2016-09-17 NOTE — Progress Notes (Signed)
Palliative Care  I spoke with [name]'s daughter- [name]- yesterday evening and learned that she was flying to [place] last night, and planned to be at the hospital sometime today. I explained my role on her mother's care team, and asked that we meet to discuss discharge planning, specifically exploring what kind of support her mother would require and how that could best be achieved. She asked that I call today at 0800 to plan a time to meet, as she is working with local friends to arrange transportation. I called this morning once at 0800, then again at 1130. She did not know when a ride to the hospital could be arranged, but asked if she could call me when she knew. I gave her my direct contact information. At 1330 I called the home number and left my number again. Now at 1500 I called her cell phone and was unable to leave a message (no voicemail). Per [name], her daughter has been at the hospital, but was not there the two different times I stopped by.   Disposition pending our conversation. She did take down my contact information when we spoke this morning, and I left it on her home machine (both in [place] and in [place]).   Charlynn Court AGNP-C Palliative Care  [phone] (cell, 7a-4:30p) [phone] (team number, nights and weekends)  No change note.

## 2016-09-18 LAB — CBC
HEMATOCRIT: 30.8 % — AB (ref 36.0–46.0)
Hemoglobin: 10.4 g/dL — ABNORMAL LOW (ref 12.0–15.0)
MCH: 30.9 pg (ref 26.0–34.0)
MCHC: 33.8 g/dL (ref 30.0–36.0)
MCV: 91.4 fL (ref 78.0–100.0)
Platelets: 96 10*3/uL — ABNORMAL LOW (ref 150–400)
RBC: 3.37 MIL/uL — AB (ref 3.87–5.11)
RDW: 19.2 % — ABNORMAL HIGH (ref 11.5–15.5)
WBC: 4.8 10*3/uL (ref 4.0–10.5)

## 2016-09-18 MED ORDER — HEPARIN SOD (PORK) LOCK FLUSH 100 UNIT/ML IV SOLN
500.0000 [IU] | INTRAVENOUS | Status: AC | PRN
  Administered 2016-09-18: 500 [IU]

## 2016-09-18 MED ORDER — MODAFINIL 100 MG PO TABS: 100.0000 mg | ORAL_TABLET | Freq: Every day | ORAL | 0 refills | Status: AC

## 2016-09-18 MED ORDER — APIXABAN 5 MG PO TABS: 5.0000 mg | ORAL_TABLET | Freq: Two times a day (BID) | ORAL | 0 refills | Status: AC

## 2016-09-18 MED ORDER — SODIUM CHLORIDE 0.9 % IV BOLUS (SEPSIS)
500.0000 mL | Freq: Once | INTRAVENOUS | Status: AC
  Administered 2016-09-18: 500 mL via INTRAVENOUS

## 2016-09-18 NOTE — Care Management Note (Signed)
Case Management Note  Patient Details  Name: Mala Bonkoski MRN: [id number] Date of Birth: March 07, 1941  Subjective/Objective:                    Action/Plan:  DC to SNF as facilitated by CSW.   Expected Discharge Date:  09/18/16               Expected Discharge Plan:  Skilled Nursing Facility  In-House Referral:  Clinical Social Work  Discharge planning Services  CM Consult  Post Acute Care Choice:    Choice offered to:     DME Arranged:    DME Agency:     HH Arranged:    [place] Agency:     Status of Service:  Completed, signed off  If discussed at [employer], dates discussed:    Additional Comments:  Carles Collet, RN 09/18/2016, 1:00 PM

## 2016-09-18 NOTE — Progress Notes (Signed)
Daily Progress Note   Patient Name: Christy Harrell       Date: 09/18/2016 DOB: 02-19-1941  Age: 76 y.o. MRN#: [id number] Attending Physician: Florencia Reasons, MD Primary Care Physician: [name], MD Admit Date: 08/28/2016  Reason for Consultation/Follow-up: Disposition and Establishing goals of care  Subjective: Christy Harrell remains socially conversational and is similar to prior days. She did eat a bit more with her daughter yesterday. No acute complaints today.   Length of Stay: 20  Current Medications: Scheduled Meds:  . apixaban  5 mg Oral BID  . chlorhexidine  15 mL Mouth Rinse BID  . feeding supplement (ENSURE ENLIVE)  237 mL Oral TID BM  . levothyroxine  175 mcg Oral QAC breakfast  . mouth rinse  15 mL Mouth Rinse q12n4p  . modafinil  100 mg Oral Daily  . potassium chloride  40 mEq Oral BID    Continuous Infusions:   PRN Meds: acetaminophen, docusate sodium, lip balm, loperamide, lubiprostone, magic mouthwash, ondansetron **OR** ondansetron (ZOFRAN) IV, sodium chloride flush  Physical Exam       Constitutional: She appears cachectic. She has a sickly appearance.  HENT:  Mouth/Throat: Oropharynx is clear and moist. No oropharyngeal exudate.  Eyes: EOM are normal.  Neck: Normal range of motion. Neck supple.  Pulmonary/Chest: Effort normal. No respiratory distress.  Abdominal: Soft. There is no tenderness.  Musculoskeletal: She exhibits edema (RUE, BLE).  Neurological: She is alert.  Socially conversational. Oriented to self and location only. Could not tell me her birthday. Skin: Skin is warm and dry. There is pallor.  Toenails long and curling  Psychiatric: She has a normal mood and affect. Thought content normal. Cognition and memory are impaired. She exhibits abnormal recent memory and abnormal remote memory.    Vital  Signs: BP (!) 110/52 (BP Location: Left Arm)   Pulse 75   Temp 98.9 F (37.2 C) (Oral)   Resp 18   Ht 5' (1.524 m)   Wt 51.8 kg (114 lb 3.2 oz)   SpO2 98%   BMI 22.30 kg/m  SpO2: SpO2: 98 % O2 Device: O2 Device: Not Delivered O2 Flow Rate: O2 Flow Rate (L/min): 3 L/min  Intake/output summary:   Intake/Output Summary (Last 24 hours) at 09/18/16 1232 Last data filed at 09/18/16 1154  Gross per 24 hour  Intake              440 ml  Output              935 ml  Net             -495 ml   LBM: Last BM Date: 09/18/16 Baseline Weight: Weight: 51.8 kg (114 lb 3.2 oz) Most recent weight: Weight: 51.8 kg (114 lb 3.2 oz)  Palliative Assessment/Data: PPS 40-50%   Flowsheet Rows   Flowsheet Row Most Recent Value  Intake Tab  Referral Department  Hospitalist  Unit at Time of Referral  Med/Surg Unit  Palliative Care Primary Diagnosis  Cancer  Date Notified  09/14/16  Palliative Care Type  New Palliative care  Reason for referral  Clarify Goals of Care  Date of Admission  08/28/16  # of days IP prior  to Palliative referral  17  Clinical Assessment  Psychosocial & Spiritual Assessment  Palliative Care Outcomes      Patient Active Problem List   Diagnosis Date Noted  . Encephalopathy   . Palliative care by specialist   . Goals of care, counseling/discussion   . Leg DVT (deep venous thromboembolism), acute, left ([place]) 08/30/2016  . Acute deep vein thrombosis (DVT) of femoral vein of left lower extremity ([place])   . Acute encephalopathy 08/29/2016  . Dehydration 08/29/2016  . Altered mental state 08/28/2016  . Failure to thrive in adult 08/28/2016  . Thrombocytopenia ([place]) 08/28/2016  . 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 Cancer related pain 05/13/2016  . Anemia 02/19/2016  . Port catheter in place 01/21/2016  . Right facial pain 12/03/2015  . Eczema, dyshidrotic 06/19/2015  . 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  Allergy history, radiographic dye 11/21/2014  . Metastases to the liver ([place]) 07/17/2014  . Routine general medical examination at a health care facility 07/10/2014  . Situational anxiety 04/18/2014  . Cough 03/08/2014  . Severe obesity (BMI >= 40) ([place]) 01/15/2014  . Coronary artery calcification seen on CAT scan 12/25/2013  . Atherosclerosis of aorta ([place]) 12/02/2013  . Hypokalemia 11/28/2013  . Unspecified constipation 08/21/2013  . Malignant neoplasm of lower-outer quadrant of left breast of female, estrogen receptor positive ([place]) 05/09/2013  . Venous stasis dermatitis 03/22/2013  . Bone metastasis ([place]) 01/31/2013  . Unspecified vitamin D deficiency 12/23/2012  . Low back pain 12/01/2012  . Lymphedema of upper extremity following lymphadenectomy 02/15/2012  . Spinal stenosis in cervical region 10/28/2011  . Cervical spondylitis with radiculitis ([place]) 10/28/2011  . Hyperlipidemia with target LDL less than 100 09/03/2011  . Type 2 diabetes, uncontrolled, with neuropathy ([place]) 09/03/2011  . Osteoporosis 09/03/2011  . Hypothyroidism 09/03/2011  . Generalized OA 09/03/2011    Palliative Care Assessment & Plan   HPI: 76 y.o. female  with past medical history of metastatic breast cancer (followed by [facility]), DM2, HTN, and HLD who presented with AMS and was admitted on 08/28/2016. Work-up for etiology has been non-revealing. Labs non-contributory. EEG negative for epileptiform activity. LP with no evidence of infection with cytology negative for leptomeningeal spread. MRI and CT brain both negative for metastasis. UA without pyuria. 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 Of note, the steroids have provided some improvement, but she remains far from baseline. Palliative consulted to assist family in goals of care discussion in light of minimal improvement.   Assessment: Christy Harrell does seem to be making some  improvement in her mentation/cognition and alertness, though she remains unable to preform self care and is not safe to be at home without 24 hour care. Her high-dose steroid course was completed 2/6 (five day course).   Her daughter has now arrived from [place] and is at her bedside. She plans on relocating her mother to [place] as soon as able, and has been working with family and friends to facilitate this transfer. She is agreeable to discharge to SNF with palliative today, as she will need a few more days to coordinate the [place] trip. In terms of her plans once in [place], she is still in the process of deciding what would be the best course of action. She has already spoken with the local cancer center, as well as reaching out to hospice. We discussed the  importance of monitoring her mother for any improvement. If she remains tired with minimal oral intake, I recommended Hospice. If she improves in her energy and intake, pursuing further oncology care would not be inappropriate. She was agreeable with this plan. At home she has extensive equipment, including a hospital bed, Hoyer lift, and bedside commode. She also has extensive friend and a Government social research officer for support.   Recommendations/Plan:  DNR, continue supportive care and discharge to SNF with Palliative  Would continue all medications, she is not comfort care only measures  Goals of Care and Additional Recommendations:  Limitations on Scope of Treatment: Full Scope Treatment  Code Status:  DNR  Prognosis:   < 6 months. Pt's intake is poor and her overall debility makes her at high risk for infection. She also has known metastatic breast cancer.  Discharge Planning:  SNF with palliative care.  Care plan was discussed with pt, pt's daughter, and primary team.   Thank you for allowing the Palliative Medicine Team to assist in the care of this patient.  Total time 35 minutes    Greater than 50%  of this time was spent counseling  and coordinating care related to the above assessment and plan.  Charlynn Court, NP Palliative Medicine Team [phone] pager (7a-5p) Team Phone # [phone]

## 2016-09-18 NOTE — Progress Notes (Addendum)
Nsg Discharge Note  Admit Date:  08/28/2016 Discharge date: 09/18/2016   Christy Harrell to be D/C'd Nursing Home per MD order.  AVS completed.  Copy for chart, and copy for patient signed, and dated. Patient/caregiver able to verbalize understanding.  Discharge Medication: Allergies as of 09/18/2016      Reactions   Bydureon [exenatide] Rash   Gadolinium Derivatives Other (See Comments)   Pt began having chest numbness/tingling , stated her heart felt funny, had to take her to ED for EKG per RN   CAP   Enalapril Rash      Medication List    STOP taking these medications   doxepin 25 MG capsule Commonly known as:  SINEQUAN   furosemide 40 MG tablet Commonly known as:  LASIX   oxyCODONE 5 MG immediate release tablet Commonly known as:  Oxy IR/ROXICODONE   pregabalin 75 MG capsule Commonly known as:  LYRICA     TAKE these medications   acetaminophen 325 MG tablet Commonly known as:  TYLENOL Take 2 tablets (650 mg total) by mouth every 6 (six) hours as needed for mild pain (or Fever >/= 101).   apixaban 5 MG Tabs tablet Commonly known as:  ELIQUIS Take 1 tablet (5 mg total) by mouth 2 (two) times daily.   aspirin EC 81 MG tablet Take 1 tablet (81 mg total) by mouth daily.   atorvastatin 80 MG tablet Commonly known as:  LIPITOR Take 1 tablet (80 mg total) by mouth daily.   docusate sodium 100 MG capsule Commonly known as:  COLACE Take 1 capsule (100 mg total) by mouth 2 (two) times daily as needed. Reported on 10/17/2015 What changed:  reasons to take this  additional instructions   feeding supplement (ENSURE ENLIVE) Liqd Take 237 mLs by mouth 3 (three) times daily between meals.   levothyroxine 175 MCG tablet Commonly known as:  SYNTHROID Take 1 tablet (175 mcg total) by mouth daily before breakfast.   lubiprostone 24 MCG capsule Commonly known as:  AMITIZA Take 1 capsule (24 mcg total) by mouth 2 (two) times daily with a meal. What changed:  when to take  this  reasons to take this   magic mouthwash Soln Take 5 mLs by mouth 4 (four) times daily as needed for mouth pain.   metFORMIN 500 MG tablet Commonly known as:  GLUCOPHAGE Take 1 tablet (500 mg total) by mouth daily.   modafinil 100 MG tablet Commonly known as:  PROVIGIL Take 1 tablet (100 mg total) by mouth daily. Start taking on:  09/19/2016   polyethylene glycol powder powder Commonly known as:  GLYCOLAX/MIRALAX Take 255 g (1 Container total) by mouth daily. What changed:  when to take this  reasons to take this   potassium chloride 10 MEQ CR capsule Commonly known as:  MICRO-K Take 1 capsule (10 mEq total) by mouth 2 (two) times daily.       Discharge Assessment: Vitals:   09/18/16 0805 09/18/16 1419  BP: (!) 110/52 (!) 97/49  Pulse: 75 85  Resp: 18   Temp: 98.9 F (37.2 C) 99 F (37.2 C)   D/c Instructions-Education: Discharged to SNF via PTAR has stage II to sacrum with foam intact. Foley in place for retention.  Vanetta Rule Margaretha Sheffield, RN 09/18/2016 3:37 PM

## 2016-09-18 NOTE — Discharge Summary (Signed)
Discharge Summary  Christy Harrell [id number] DOB: 06-Feb-1941  PCP: [name], MD  Admit date: 08/28/2016 Discharge date: 09/18/2016  Time spent: >59mins, more than 50% time spent of coordination of care, from 10am to 10:45am.  Recommendations for Outpatient Follow-up:  1. Discharge to SNF with palliative care  Discharge Diagnoses:  Active Hospital Problems   Diagnosis Date Noted  . Failure to thrive in adult 08/28/2016  . Encephalopathy   . Palliative care by specialist   . Goals of care, counseling/discussion   . Leg DVT (deep venous thromboembolism), acute, left ([place]) 08/30/2016  . Acute deep vein thrombosis (DVT) of femoral vein of left lower extremity ([place])   . Acute encephalopathy 08/29/2016  . Dehydration 08/29/2016  . Altered mental state 08/28/2016  . Thrombocytopenia ([place]) 08/28/2016  . 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 Hypokalemia 11/28/2013  . Malignant neoplasm of lower-outer quadrant of left breast of female, estrogen receptor positive ([place]) 05/09/2013  . Hypothyroidism 09/03/2011  . Type 2 diabetes, uncontrolled, with neuropathy ([place]) 09/03/2011    Resolved Hospital Problems   Diagnosis Date Noted Date Resolved  No resolved problems to display.    Discharge Condition: stable  Diet recommendation:   Diet recommendations: Dysphagia 1 (puree);Thin liquid Liquids provided via: Straw;Cup Medication Administration: Whole meds with puree Supervision: Patient able to self feed;Full supervision/cueing for compensatory strategies;Staff to assist with self feeding Compensations: Slow rate;Small sips/bites;Minimize environmental distractions Postural Changes and/or Swallow Maneuvers: Seated upright 90 degrees    Filed Weights   08/29/16 0041  Weight: 51.8 kg (114 lb 3.2 oz)    History of present illness:  Consultants:  [facility] - oncology; dentist Patient coming from: home - lives alone after her mother died in 12/03/14; [place]: daughter,  [name], [phone]  Chief Complaint: altered mental status  HPI: Christy Harrell is a 76 y.o. female with medical history significant of hypothyroidism, metastatic breat cancer undergoing chemotherapy, HTN, HLD, and DM presenting with AMS.  The patient is confused and explains that her daughter felt like she wasn't walking right.  Had to see the dentist.  Her daughter talked to Dr. [name] and his nurse.  She was in the ER yesterday.  I called and spoke with her daughter, who lives in [place], [place].  Her daughter reports that she has been sounding lethargic, not laughing as usual, repeating herself, complaining about her mouth hurting. On January 2, the patient went to the oral surgeon, had teeth removed, had bone biopsy.  He gave her antibiotics (which she never took) and pain medications.  She went back and they told her there was no cancer present.  But she is still complaining about pain. Wednesday night, her daughter called starting about 6pm.  No answer.  4th call, the patient answered the phone and said she had been sleeping.  Her daughter reports that she has been sleeping all the time.  Yesterday, her cousin called her and they were unable to reach the patient.  Sent a neighbor to check on her.  They found her on the toilet, had been there for a couple of hours and was unable to get up and walk.  She required a lot of assistance walking.  They seated her in a chair in the den.  Neighbor was concerned she wasn't eating or drinking and fixed her some soup and a drink.  Meanwhile, the daughter called Dr. Virgie Dad nurse and they encouraged her to call 911.  Paramedics came and she looked okay but her  BP was a little low and the patient refused transport.  They gave a 500 cc bolus.  This AM, the cousin went to check on her and she wasn't answering the phone or door.  Went in the house to check on her - the patient had not left the chair she was placed in yesterday.  Never touched food/drink.   Incontinence.  Patient denying all of these things.  Daughter concerned about too much pain medication.  They called 911 and she again told the paramedics she didn't need to go.  They only brought her because her mental state is altered.  Symptoms really started back in August with a UTI and this has been gradually worsening since, particularly over the last 2 weeks.  Daughter is frustrated with not being able to determine what she qualifies for regarding home health services.  Seriously considering moving her mother back to [place], but the patient does not want to go.   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 Paraneoplastic syndrome  - TSH 2.106, - Ammonia 26, - serum B12--1645, - RPR--neg - MRI of the brain with contrast and CT head both neg for metastasis. no evidence of infection or meningeal signs. - Urinalysis negative for pyuria, ABG shows hyperventilation. Mild hypoxia. - neurology team consulted, EEG done but not revealing etiology of the encephalopathy . Attempted lumbar puncture twice (one at bedside by neurology, one by IR), both unsuccessful ,  - discussed with oncology Dr. [name]. 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 Attempting Provigil trial and as a last resort for improvement in patient's mentation. - No sedative medications,  - she continue to be drowsy, carrying simple conversation, poor oral intake, no meaningful improvement, daughter who is the power of attorney decided to take patient to snf with palliative care, if patient continue to decline, she is open to hospice care. for now continue current medical menagement   Failure to thrive, severe PCM  - consulted nutrition, appreciate assistance  - Poor oral intake actually none.  - Add megace. - Palliative care consulted  Metastatic breast cancer - Initially diagnosed in March 2012 - Metastasis noted initially May 2014--metastasis to bone and liver - Has had history of pathologic fracture to the humerus - has been receiving eribulin, last does 07/23/2017 - appreciate oncology Dr. [name] input  Osteonecrosis of the jaw - per Dr. Karmen Bongo, Bone was biopsied and sent to [facility] with result read as osteonecrosis of the jaw, no cancer or evidence of infection noted - 08/28/2016 CT neck right mandible with large bony sequestrum - started Amox/Clav that was ordered by her oral surgeon but pt did not fill Rx, completed treatment here in  the hospital  Hypokalemia Replace and Monitor   Thrombocytopenia, pancytopenia  - possibly due to acute DVT,  - - Serum B12--1645 - TSH 2.106 -wbc 4.8, hgb 10.4, plt 96 at discharge.  Acute DVT Left Lower Extremity - venous duplex--+DVT left common iliac and left external iliac - started apixaban.  DM2 - A1C 4.6 - allow liberal control - no need for SSI  Hyperlipidemia - continue statin  Dysphagia - evaluated by speech-->dys 1 with thin liquids    Diet: dys 1 diet DVT Prophylaxis: on therapeutic anticoagulation.  Advance goals of care discussion: DNR DNI  Family Communication: daughter   Disposition:    Discharge to SNF with palliative care   Consultants: oncology, neurology, palliative care Procedures: LP  Antibiotics:           Anti-infectives    Start     Dose/Rate Route Frequency Ordered Stop   08/30/16 1245  amoxicillin-clavulanate (AUGMENTIN) 875-125 MG per tablet 1 tablet     1 tablet Oral Every 12 hours 08/30/16 1236 09/04/16 2142      Discharge Exam: BP (!) 110/52 (BP Location: Left Arm)   Pulse 75   Temp 98.9 F (37.2 C) (Oral)   Resp 18   Ht 5' (1.524 m)   Wt 51.8 kg (114 lb 3.2 oz)   SpO2 98%   BMI 22.30 kg/m   General: drowsy, open eyes to voice, carry brief conversation, drift back to sleep, only oriented to person. Cardiovascular: RRR Respiratory: diminished at basis. Chest port in place.   Discharge Instructions You were cared for by a hospitalist during your hospital stay. If you have any questions about your discharge medications or the care you received while you were in the hospital after you are discharged, you can call the unit and asked to speak with the hospitalist on call if the hospitalist that took care of you is not available. Once you are discharged, your primary care physician will handle any further medical issues. Please note that NO REFILLS for any discharge medications will be authorized once you are discharged, as it is imperative that you return to your primary care physician (or establish a relationship with a primary care physician if you do not have one) for your aftercare needs so that they can reassess your need for medications and monitor your lab values.  Discharge Instructions    Diet - low sodium heart healthy    Complete by:  As directed    Diet recommendations: Dysphagia 1 (puree);Thin liquid Liquids provided via: Straw;Cup Medication Administration: Whole meds with puree Supervision: Patient able to self feed;Full supervision/cueing for compensatory strategies;Staff to assist with self feeding Compensations: Slow  rate;Small sips/bites;Minimize environmental distractions Postural Changes and/or Swallow Maneuvers: Seated upright 90 degrees   Discharge instructions    Complete by:  As directed    It is important that you read following instructions as well as go over your medication list with RN to help you understand your care after this hospitalization.  Discharge Instructions: Please follow-up with PCP in one week  Please request your primary care physician to go over all Hospital Tests and Procedure/Radiological results at the follow up,  Please get all Hospital records sent to your PCP by signing hospital release before you go home.   Do not drive, operating heavy machinery, perform activities at heights, swimming or participation in water activities or provide baby sitting services; until you have been seen by Primary Care Physician or a Neurologist and advised to  do so again. Do not take more than prescribed Pain, Sleep and Anxiety Medications. You were cared for by a hospitalist during your hospital stay. If you have any questions about your discharge medications or the care you received while you were in the hospital after you are discharged, you can call the unit and ask to speak with the hospitalist on call if the hospitalist that took care of you is not available.  Once you are discharged, your primary care physician will handle any further medical issues. Please note that NO REFILLS for any discharge medications will be authorized once you are discharged, as it is imperative that you return to your primary care physician (or establish a relationship with a primary care physician if you do not have one) for your aftercare needs so that they can reassess your need for medications and monitor your lab values. You Must read complete instructions/literature along with all the possible adverse reactions/side effects for all the Medicines you take and that have been prescribed to you. Take any new  Medicines after you have completely understood and accept all the possible adverse reactions/side effects. Wear Seat belts while driving. If you have smoked or chewed Tobacco in the last 2 yrs please stop smoking and/or stop any Recreational drug use.   Increase activity slowly    Complete by:  As directed      Allergies as of 09/18/2016      Reactions   Bydureon [exenatide] Rash   Gadolinium Derivatives Other (See Comments)   Pt began having chest numbness/tingling , stated her heart felt funny, had to take her to ED for EKG per RN   CAP   Enalapril Rash      Medication List    STOP taking these medications   doxepin 25 MG capsule Commonly known as:  SINEQUAN   furosemide 40 MG tablet Commonly known as:  LASIX   oxyCODONE 5 MG immediate release tablet Commonly known as:  Oxy IR/ROXICODONE   pregabalin 75 MG capsule Commonly known as:  LYRICA     TAKE these medications   acetaminophen 325 MG tablet Commonly known as:  TYLENOL Take 2 tablets (650 mg total) by mouth every 6 (six) hours as needed for mild pain (or Fever >/= 101).   apixaban 5 MG Tabs tablet Commonly known as:  ELIQUIS Take 1 tablet (5 mg total) by mouth 2 (two) times daily.   aspirin EC 81 MG tablet Take 1 tablet (81 mg total) by mouth daily.   atorvastatin 80 MG tablet Commonly known as:  LIPITOR Take 1 tablet (80 mg total) by mouth daily.   docusate sodium 100 MG capsule Commonly known as:  COLACE Take 1 capsule (100 mg total) by mouth 2 (two) times daily as needed. Reported on 10/17/2015 What changed:  reasons to take this  additional instructions   feeding supplement (ENSURE ENLIVE) Liqd Take 237 mLs by mouth 3 (three) times daily between meals.   levothyroxine 175 MCG tablet Commonly known as:  SYNTHROID Take 1 tablet (175 mcg total) by mouth daily before breakfast.   lubiprostone 24 MCG capsule Commonly known as:  AMITIZA Take 1 capsule (24 mcg total) by mouth 2 (two) times daily with a  meal. What changed:  when to take this  reasons to take this   magic mouthwash Soln Take 5 mLs by mouth 4 (four) times daily as needed for mouth pain.   metFORMIN 500 MG tablet Commonly known as:  GLUCOPHAGE Take 1  tablet (500 mg total) by mouth daily.   modafinil 100 MG tablet Commonly known as:  PROVIGIL Take 1 tablet (100 mg total) by mouth daily. Start taking on:  09/19/2016   polyethylene glycol powder powder Commonly known as:  GLYCOLAX/MIRALAX Take 255 g (1 Container total) by mouth daily. What changed:  when to take this  reasons to take this   potassium chloride 10 MEQ CR capsule Commonly known as:  MICRO-K Take 1 capsule (10 mEq total) by mouth 2 (two) times daily.      Allergies  Allergen Reactions  . Bydureon [Exenatide] Rash  . Gadolinium Derivatives Other (See Comments)    Pt began having chest numbness/tingling , stated her heart felt funny, had to take her to ED for EKG per RN   CAP  . Enalapril Rash    Contact information for follow-up providers    [name], MD. Schedule an appointment as soon as possible for a visit in 1 week(s).   Specialty:  Internal Medicine Contact information: [address] [place] [address] [phone]        MAGRINAT,GUSTAV C, MD. Schedule an appointment as soon as possible for a visit in 1 week(s).   Specialty:  Oncology Contact information: [place] [address] [phone]            Contact information for after-discharge care    [facility] Follow up.   Specialty:  [place] information: [address] [place] [phone]                   The results of significant diagnostics from this hospitalization (including imaging, microbiology, ancillary and laboratory) are listed below for reference.    Significant Diagnostic Studies: Ct Head Wo Contrast  Result Date:  08/28/2016 CLINICAL DATA:  Altered mental status EXAM: CT HEAD WITHOUT CONTRAST TECHNIQUE: Contiguous axial images were obtained from the base of the skull through the vertex without intravenous contrast. COMPARISON:  10/04/2015 FINDINGS: Brain: No intracranial hemorrhage, mass effect or midline shift. Mild cerebral atrophy. Mild periventricular chronic white matter disease. No acute cortical infarction. No mass lesion is noted on this unenhanced scan. Vascular: Atherosclerotic calcifications of carotid siphon are noted. Skull: No skull fracture. Sinuses/Orbits: No acute findings Other: None IMPRESSION: No acute intracranial abnormality. Mild cerebral atrophy. Mild periventricular probable chronic white matter disease. No acute cortical infarction. Electronically Signed   By: Lahoma Crocker M.D.   On: 08/28/2016 16:50   Ct Head W & Wo Contrast  Result Date: 09/01/2016 CLINICAL DATA:  Altered mental status. History of metastatic breast cancer. EXAM: CT HEAD WITHOUT AND WITH CONTRAST TECHNIQUE: Contiguous axial images were obtained from the base of the skull through the vertex without and with intravenous contrast CONTRAST:  72mL ISOVUE-300 IOPAMIDOL (ISOVUE-300) INJECTION 61% COMPARISON:  08/29/2016 brain MRI FINDINGS: Brain: No evidence of acute infarction, hemorrhage, hydrocephalus, extra-axial collection or mass lesion/mass effect. Mild for age generalized volume loss and white matter disease from chronic microvascular ischemia. Vascular: No hyperdense vessel or unexpected calcification. Visible vessels are patent. Broad basilar tip which contacts the third ventricular floor; on thinner section imaging in an earlier contrast phase on maxillofacial CT 08/28/2016 there is no aneurysm. Skull: No acute or aggressive finding. Sinuses/Orbits: Negative IMPRESSION: No acute finding.  No evidence of metastatic disease. Electronically Signed   By: Monte Fantasia M.D.   On: 09/01/2016 11:35  Ct Soft Tissue Neck W  Contrast  Result Date: 08/28/2016 CLINICAL DATA:  Bleeding in mouth and throat. Severe halitosis. History of diabetes, metastatic breast cancer. EXAM: CT NECK WITH CONTRAST TECHNIQUE: Multidetector CT imaging of the neck was performed using the standard protocol following the bolus administration of intravenous contrast. CONTRAST:  61mL ISOVUE-300 IOPAMIDOL (ISOVUE-300) INJECTION 61% COMPARISON:  CT neck Dec 26, 2015 FINDINGS: Pharynx and larynx: Normal. Salivary glands: Atrophic and nonacute. Thyroid: Status post thyroidectomy. Lymph nodes: No lymphadenopathy by CT size criteria. Vascular: Mild calcific atherosclerosis of the carotid bifurcations. Limited intracranial: Normal. Visualized orbits: Normal. Mastoids and visualized paranasal sinuses: Small bilateral maxillary mucosal retention cysts without paranasal sinus air-fluid levels. Mastoid air cells are well aerated. Skeleton: Large bony fragment RIGHT mandible body superior aspect with erosive margins. Interval loss of multiple RIGHT maxillary teeth. Severe C5-6 and moderate C6-7 degenerative discs. Multilevel severe facet arthropathy. No destructive bony lesions. Dental malocclusion Upper chest: LEFT chest Port-A-Cath. Lung apices are clear. No superior mediastinal lymphadenopathy. Other: None. IMPRESSION: Interval loss of multiple RIGHT mandible teeth. RIGHT mandible probable osteomyelitis with large bony sequestrum, less likely postprocedural fracture nonunion. Electronically Signed   By: Elon Alas M.D.   On: 08/28/2016 19:03   Mr Brain Wo Contrast  Result Date: 08/29/2016 CLINICAL DATA:  History of metastatic breast cancer. Acute presentation with altered mental status. Gait disturbance. Lethargy. EXAM: MRI HEAD WITHOUT CONTRAST TECHNIQUE: Multiplanar, multiecho pulse sequences of the brain and surrounding structures were obtained without intravenous contrast. COMPARISON:  Head CT 08/28/2016 FINDINGS: Brain: Diffusion imaging does not show  any acute or subacute infarction or other cause of restricted diffusion. There is generalized brain atrophy with mild small vessel changes of the deep white matter. No cortical or large vessel territory infarction. No evidence of mass lesion, hemorrhage, hydrocephalus or extra-axial collection. No pituitary mass. Vascular: Major vessels at the base of the brain show flow. Skull and upper cervical spine: Negative Sinuses/Orbits: Clear/normal Other: None significant IMPRESSION: No evidence of metastatic disease or other acute process. Age related atrophy with mild small vessel change of the deep white matter. Electronically Signed   By: Nelson Chimes M.D.   On: 08/29/2016 13:04   Mr Brain W Contrast  Result Date: 09/05/2016 CLINICAL DATA:  Initial evaluation for encephalopathy. History of metastatic breast cancer. Evaluate for possible carcinomatosis. EXAM: MRI HEAD WITH CONTRAST TECHNIQUE: Multiplanar, multiecho pulse sequences of the brain and surrounding structures were obtained with intravenous contrast. CONTRAST:  61mL MULTIHANCE GADOBENATE DIMEGLUMINE 529 MG/ML IV SOLN COMPARISON:  Comparison made with prior MRI from 08/20/2016. FINDINGS: Brain: Stable age-related cerebral atrophy with mild chronic microvascular ischemic disease. Ventricles normal in size without hydrocephalus. No extra-axial fluid collection. No midline shift. No secondary signs for acute infarct. Following contrast infusion, no abnormal enhancement identified. No findings to suggest intraparenchymal metastasis. No evidence for carcinomatosis or other leptomeningeal or dural based metastatic disease. Vascular: Major intracranial vascular flow voids grossly patent. Skull and upper cervical spine: Craniocervical junction within normal limits. Upper cervical spine unremarkable. Bone marrow signal intensity within normal limits. No scalp soft tissue abnormality. Sinuses/Orbits: Globes and orbital soft tissues grossly unremarkable. Mild mucosal  thickening within the maxillary sinuses. Paranasal sinuses are otherwise largely clear. No obvious mastoid effusion. IMPRESSION: 1. No MRI evidence for intracranial carcinomatosis or other metastatic disease. 2. No new acute intracranial process identified on this limited exam. 3. Age-related cerebral atrophy with mild chronic small vessel ischemic disease. Electronically Signed   By: Marland Kitchen  Jeannine Boga M.D.   On: 09/05/2016 02:39   Mr Liver Wo Contrast  Result Date: 08/20/2016 CLINICAL DATA:  Breast cancer with bone and liver metastases. EXAM: MRI ABDOMEN WITHOUT CONTRAST TECHNIQUE: Multiplanar multisequence MR imaging was performed without the administration of intravenous contrast. COMPARISON:  06/04/2011 FINDINGS: Lower chest: No pleural fluid. Postoperative findings in right breast including diffuse edema and small fluid collection appear similar to previous exam. Hepatobiliary: Multifocal liver metastases are again identified. Index lesion within posterior right lobe of liver measures 3.5 x 5.0 cm, image 21 of series 4. On the previous exam this measured 4.9 x 6.0 cm. The index lesion within the lateral segment of left lobe of liver measures 5.0 x 3.3 cm, image 15 of series 4. This is compared with 5.0 x 3.3 cm previously. Index lesion within left lobe of liver measures 1.5 x 1.4 cm, image 18 of series 4. Previously 1.7 x 1.3 cm. Pancreas: No mass, inflammatory changes, or other parenchymal abnormality identified. Spleen:  Within normal limits in size and appearance. Adrenals/Urinary Tract: No masses identified. No evidence of hydronephrosis. Stomach/Bowel: Visualized portions within the abdomen are unremarkable. Vascular/Lymphatic: No pathologically enlarged lymph nodes identified. No abdominal aortic aneurysm demonstrated. Other:  No free fluid or fluid collections identified Musculoskeletal: Mild lumbar spondylosis and degenerative disc disease. IMPRESSION: 1. Taking into account respiratory motion  artifact on previous exam, the overall burden of hepatic metastasis is stable. 2. Lumbar spondylosis and degenerative disc disease. Electronically Signed   By: Kerby Moors M.D.   On: 08/20/2016 14:29   Dg Fluoro Guide Lumbar Puncture  Result Date: 09/11/2016 CLINICAL DATA:  Encephalopathy. Previous unsuccessful lumbar puncture. EXAM: DIAGNOSTIC LUMBAR PUNCTURE UNDER FLUOROSCOPIC GUIDANCE FLUOROSCOPY TIME:  Fluoroscopy Time:  1 minutes 36 second Radiation Exposure Index (if provided by the fluoroscopic device): Number of Acquired Spot Images: 0 PROCEDURE: The patient was not able to give consent. Consent was obtained by telephone from the patient's daughter with witness by Coralee Pesa. The patient was placed prone on the fluoroscopic table. L2-3 was chosen for LP based on prior abdominal MRI review. Skin prepped with Betadine. Local anesthesia 1% lidocaine. 20 gauge spinal needle was placed into the subarachnoid space at L1-2. There was return of blood-tinged CSF with low CSF pressure. Opening pressure approximately 12 cm H2O. With the head of the table elevated, there was return of blood-tinged CSF which cleared on serial tubes. Total of 8 mL of CSF were obtained for laboratory studies. No apparent complications. IMPRESSION: Successful lumbar puncture using fluoroscopic guidance. Blood-tinged CSF cleared over time suggesting traumatic tap. Electronically Signed   By: Franchot Gallo M.D.   On: 09/11/2016 11:29   Dg Fluoro Guide Lumbar Puncture  Result Date: 09/09/2016 CLINICAL DATA:  Acute encephalopathy. EXAM: DIAGNOSTIC LUMBAR PUNCTURE UNDER FLUOROSCOPIC GUIDANCE FLUOROSCOPY TIME:  Fluoroscopy Time:  1.2 minutes Radiation Exposure Index (if provided by the fluoroscopic device): 20.4 mGy Number of Acquired Spot Images: 1 PROCEDURE: Informed consent was obtained from the patient's daughter prior to the procedure, including potential complications of headache, allergy, and pain. This study was also discussed  with the patient. With the patient prone, the lower back was prepped with Betadine. 1% Lidocaine was used for local anesthesia. Patient had a decubitus ulcer pad which was prepped and then excluded from the field. Lumbar puncture was initially attempted at L5-S1, where there was the most capacious interspinous space. By feel and needle depth, intrathecal positioning was expected, but no CSF return. After numbing, the L4-5 level  was then briefly attempted, but could not reach beyond the severely hypertrophied posterior elements. The L3-4 level was then selected and numbed; this was still well within the initial prep. The thecal sac was entered in 1 pass. CSF that fluctuated with the respiratory cycle was seen in the clear needle hub. With head up positioning (as much as tolerated) only a scant volume of CSF could be obtained (0.5 cc). There were no apparent complications. Case discussed with Dr. Leonel Ramsay. IMPRESSION: Very low pressure CSF, with only 0.5 cc collected at L3-4. Abnormally yellow tinged CSF. Electronically Signed   By: Monte Fantasia M.D.   On: 09/09/2016 15:49    Microbiology: Recent Results (from the past 240 hour(s))  C difficile quick scan w PCR reflex     Status: None   Collection Time: 09/10/16 11:32 AM  Result Value Ref Range Status   C Diff antigen NEGATIVE NEGATIVE Final   C Diff toxin NEGATIVE NEGATIVE Final   C Diff interpretation No C. difficile detected.  Final  CSF culture     Status: None   Collection Time: 09/11/16 11:01 AM  Result Value Ref Range Status   Specimen Description CSF  Final   Special Requests NONE  Final   Gram Stain NO WBC SEEN NO ORGANISMS SEEN  Final   Culture NO GROWTH 3 DAYS  Final   Report Status 09/14/2016 FINAL  Final  Respiratory Panel by PCR     Status: None   Collection Time: 09/17/16  6:32 AM  Result Value Ref Range Status   Adenovirus NOT DETECTED NOT DETECTED Final   Coronavirus 229E NOT DETECTED NOT DETECTED Final   Coronavirus HKU1  NOT DETECTED NOT DETECTED Final   Coronavirus NL63 NOT DETECTED NOT DETECTED Final   Coronavirus OC43 NOT DETECTED NOT DETECTED Final   Metapneumovirus NOT DETECTED NOT DETECTED Final   Rhinovirus / Enterovirus NOT DETECTED NOT DETECTED Final   Influenza A NOT DETECTED NOT DETECTED Final   Influenza B NOT DETECTED NOT DETECTED Final   Parainfluenza Virus 1 NOT DETECTED NOT DETECTED Final   Parainfluenza Virus 2 NOT DETECTED NOT DETECTED Final   Parainfluenza Virus 3 NOT DETECTED NOT DETECTED Final   Parainfluenza Virus 4 NOT DETECTED NOT DETECTED Final   Respiratory Syncytial Virus NOT DETECTED NOT DETECTED Final   Bordetella pertussis NOT DETECTED NOT DETECTED Final   Chlamydophila pneumoniae NOT DETECTED NOT DETECTED Final   Mycoplasma pneumoniae NOT DETECTED NOT DETECTED Final     Labs: Basic Metabolic Panel:  Recent Labs Lab 09/13/16 0500 09/13/16 1545 09/14/16 0500 09/15/16 0424 09/16/16 0435 09/17/16 0335  NA 140 139 139 138 137 137  K 2.8* 3.2* 3.6 4.4 3.8 3.9  CL 112* 109 111 107 105 107  CO2 24 22 24 23  21* 21*  GLUCOSE 186* 160* 169* 123* 184* 96  BUN 6 6 7 7 6 9   CREATININE 0.53 0.44 0.41* 0.36* 0.39* 0.36*  CALCIUM 7.7* 7.6* 7.5* 7.9* 7.6* 7.5*  MG 2.1  --  1.9 1.9 1.7 1.8   Liver Function Tests: No results for input(s): AST, ALT, ALKPHOS, BILITOT, PROT, ALBUMIN in the last 168 hours. No results for input(s): LIPASE, AMYLASE in the last 168 hours. No results for input(s): AMMONIA in the last 168 hours. CBC:  Recent Labs Lab 09/15/16 0424 09/18/16 0458  WBC 6.3 4.8  HGB 11.1* 10.4*  HCT 33.3* 30.8*  MCV 90.7 91.4  PLT 103* 96*   Cardiac Enzymes: No results for input(s):  CKTOTAL, CKMB, CKMBINDEX, TROPONINI in the last 168 hours. BNP: BNP (last 3 results) No results for input(s): BNP in the last 8760 hours.  ProBNP (last 3 results) No results for input(s): PROBNP in the last 8760 hours.  CBG: No results for input(s): GLUCAP in the last 168  hours.     SignedFlorencia Reasons MD, PhD  [facility] 09/18/2016, 12:42 PM

## 2016-09-18 NOTE — Progress Notes (Signed)
Report called to SNF

## 2016-09-18 NOTE — Clinical Social Work Note (Signed)
CSW is ready for discharge today to [facility]. Facility is ready to admit as they have received discharge information. Pt and daughter are aware and agreeable to discharge plan. RN will call report. [facility] will provide transportation. CSW is signing off as no further needs identified.   Darden Dates, MSW, LCSW  Clinical Social Worker  [phone]

## 2016-09-18 NOTE — Clinical Social Work Placement (Signed)
   CLINICAL SOCIAL WORK PLACEMENT  NOTE  Date:  09/18/2016  Patient Details  Name: Christy Harrell MRN: [id number] Date of Birth: [birth year]  Clinical Social Work is seeking post-discharge placement for this patient at the [place] level of care (*CSW will initial, date and re-position this form in  chart as items are completed):  Yes   Patient/family provided with [facility] Work Department's list of facilities offering this level of care within the geographic area requested by the patient (or if unable, by the patient's family).  Yes   Patient/family informed of their freedom to choose among providers that offer the needed level of care, that participate in Medicare, Medicaid or managed care program needed by the patient, have an available bed and are willing to accept the patient.  Yes   Patient/family informed of [facility]'s ownership interest in [facility] and [facility], as well as of the fact that they are under no obligation to receive care at these facilities.  PASRR submitted to [employer] on 09/01/16     PASRR number received on 09/01/16     Existing PASRR number confirmed on       FL2 transmitted to all facilities in geographic area requested by pt/family on 09/01/16     FL2 transmitted to all facilities within larger geographic area on       Patient informed that his/her managed care company has contracts with or will negotiate with certain facilities, including the following:        Yes   Patient/family informed of bed offers received.  Patient chooses bed at [facility]     Physician recommends and patient chooses bed at      Patient to be transferred to [facility] on 09/18/16.  Patient to be transferred to facility by [facility]     Patient family notified on 09/18/16 of transfer.  Name of family member notified:  daughter, Ms. Wynetta Emery     PHYSICIAN       Additional Comment:     _______________________________________________ Darden Dates, LCSW 09/18/2016, 4:15 PM

## 2016-09-20 ENCOUNTER — Other Ambulatory Visit: Payer: Self-pay | Admitting: Oncology

## 2016-09-23 ENCOUNTER — Telehealth: Payer: Self-pay | Admitting: Oncology

## 2016-09-23 NOTE — Telephone Encounter (Signed)
Faxed records to hospice [phone]

## 2016-09-23 NOTE — Telephone Encounter (Signed)
sw Tammy at [facility] to inform them of pt upcoming appt on 2/20 at 115 pm per LOS

## 2016-09-26 ENCOUNTER — Other Ambulatory Visit: Payer: Self-pay | Admitting: Oncology

## 2016-09-26 DIAGNOSIS — C50512 Malignant neoplasm of lower-outer quadrant of left female breast: Secondary | ICD-10-CM

## 2016-09-26 DIAGNOSIS — Z170 Estrogen receptor positive status [ER+]: Principal | ICD-10-CM

## 2016-09-26 MED ORDER — MORPHINE SULFATE 20 MG/5ML PO SOLN: 2.5000 mg | ORAL | 0 refills | Status: AC | PRN

## 2016-09-26 NOTE — Progress Notes (Unsigned)
I was called by patient's da Christy Harrell who tells me Christy Harrell has been d/c'd and she is taking her to [facility] to be with her under Hospice care there-- she is not under Hospice here. They did not send her out with a pain medicine script. I wrote a script for morphine 2.4 mg (0.6 cc) every 4 hrs as needed and left it at the volunteer's desk out front for da to pick up before they leave tonight  Cancelled future appts here

## 2016-09-28 ENCOUNTER — Telehealth: Payer: Self-pay | Admitting: Oncology

## 2016-09-28 NOTE — Telephone Encounter (Signed)
cxled appts per  LOS.

## 2016-09-29 ENCOUNTER — Ambulatory Visit: Payer: Medicare Other | Admitting: Oncology

## 2016-09-29 ENCOUNTER — Other Ambulatory Visit: Payer: Medicare Other

## 2016-10-02 LAB — MISC LABCORP TEST (SEND OUT): LABCORP TEST CODE: 9985

## 2016-10-08 DEATH — deceased

## 2016-10-09 ENCOUNTER — Telehealth: Payer: Self-pay | Admitting: Emergency Medicine

## 2016-10-09 NOTE — Telephone Encounter (Signed)
Call received from patient's daughter, Christy Harrell, to inform Dr [name] that Christy Harrell passed on 10/31/16; will notify Dr [name] of this news.

## 2017-05-28 NOTE — Telephone Encounter (Signed)
No entry 

## 2017-09-24 NOTE — Progress Notes (Signed)
This encounter was created in error - please disregard.

## 2017-11-28 IMAGING — RF DG FLUORO GUIDE LUMBAR PUNCTURE
4 series · 4 of 4 positions shown · non-contrast
Comparison: none

CLINICAL DATA: Acute encephalopathy.

[Series 1: fluoro_iodine_singleshot_bw · 0.17mm/px · 1 of 1 slices shown]
[im 1/1]
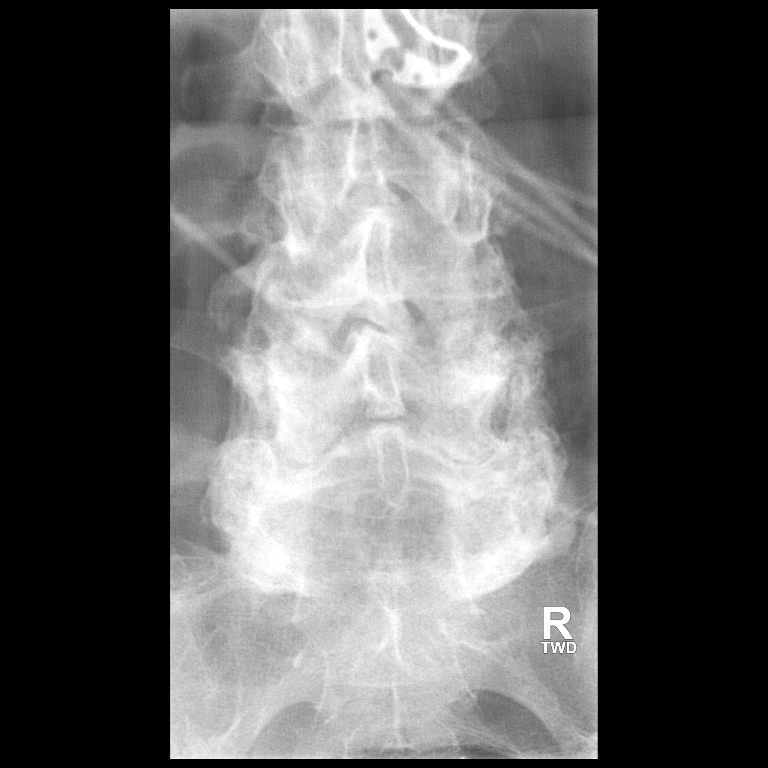

[Series 2: cp_standard · 0.17mm/px · 1 of 1 slices shown (1 of 3)]
[im 1/1]
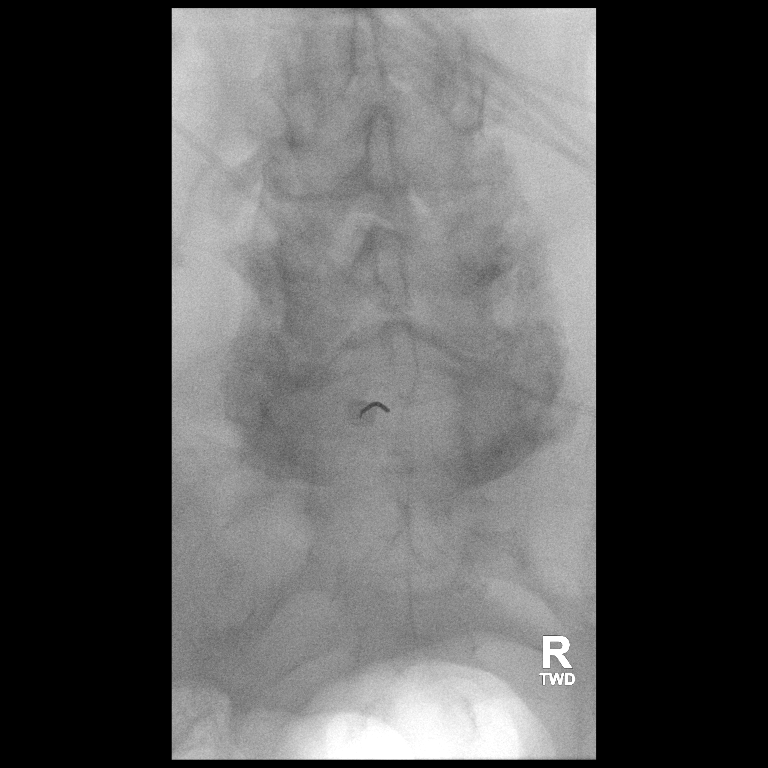

[Series 3: cp_standard · 0.17mm/px · 1 of 1 slices shown (2 of 3)]
[im 1/1]
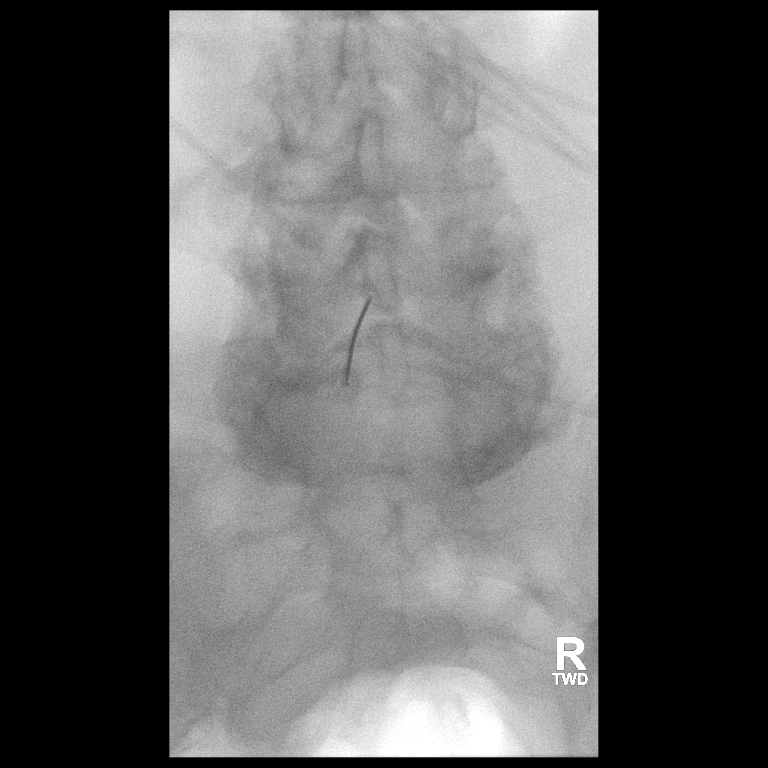

[Series 4: cp_standard · 0.17mm/px · 1 of 1 slices shown (3 of 3)]
[im 1/1]
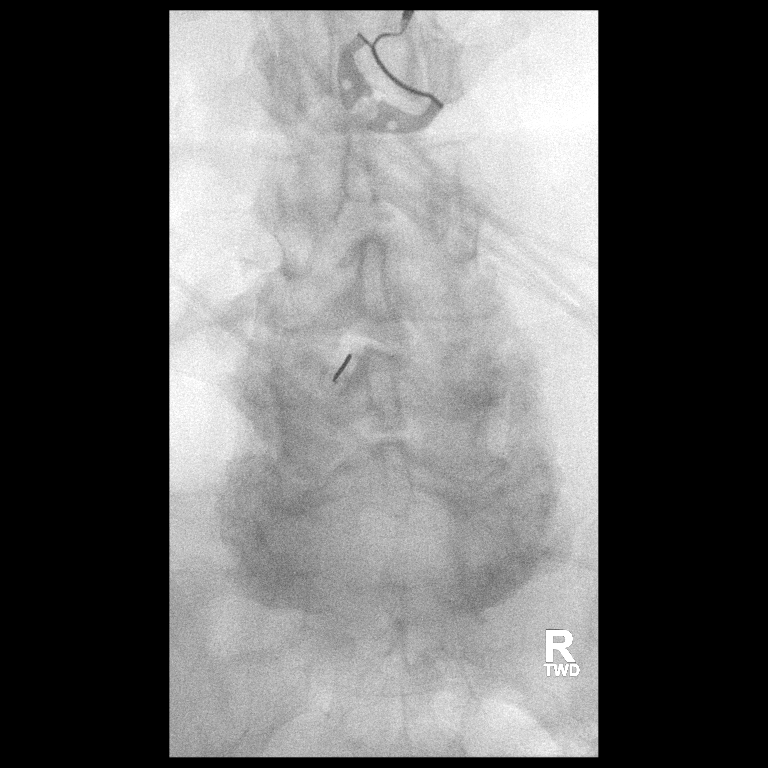

[4 of 4 positions shown; findings below may reference images not displayed]

EXAM:
DIAGNOSTIC LUMBAR PUNCTURE UNDER FLUOROSCOPIC GUIDANCE

FLUOROSCOPY TIME:  Fluoroscopy Time:  1.2 minutes

Radiation Exposure Index (if provided by the fluoroscopic device):
20.4 mGy

Number of Acquired Spot Images: 1

PROCEDURE:
Informed consent was obtained from the patient's daughter prior to
the procedure, including potential complications of headache,
allergy, and pain. This study was also discussed with the patient.
With the patient prone, the lower back was prepped with Betadine. 1%
Lidocaine was used for local anesthesia. Patient had a decubitus
ulcer pad which was prepped and then excluded from the field. Lumbar
puncture was initially attempted at L5-S1, where there was the most
capacious interspinous space. By feel and needle depth, intrathecal
positioning was expected, but no CSF return. After numbing, the L4-5
level was then briefly attempted, but could not reach beyond the
severely hypertrophied posterior elements. The L3-4 level was then
selected and numbed; this was still well within the initial prep.
The thecal sac was entered in 1 pass. CSF that fluctuated with the
respiratory cycle was seen in the clear needle hub. With head up
positioning (as much as tolerated) only a scant volume of CSF could
be obtained (0.5 cc). There were no apparent complications. Case
discussed with Dr. Cunningham.
IMPRESSION: Very low pressure CSF, with only 0.5 cc collected at L3-4.
Abnormally yellow tinged CSF.

## 2021-11-09 ENCOUNTER — Encounter: Payer: Self-pay | Admitting: Oncology

## 2021-11-29 ENCOUNTER — Other Ambulatory Visit: Payer: Self-pay | Admitting: Nurse Practitioner
